# Patient Record
Sex: Female | Born: 1975
Health system: Southern US, Community
[De-identification: ages and names within clinical notes are randomized; demographics above are authoritative.]

## PROBLEM LIST (undated history)

## (undated) ENCOUNTER — Emergency Department: Disposition: A | Discharge status: Home / Self Care | Payer: BC Managed Care – PPO

## (undated) DIAGNOSIS — R232 Flushing: Secondary | ICD-10-CM

## (undated) DIAGNOSIS — C4491 Basal cell carcinoma of skin, unspecified: Secondary | ICD-10-CM

## (undated) DIAGNOSIS — C569 Malignant neoplasm of unspecified ovary: Secondary | ICD-10-CM

## (undated) DIAGNOSIS — R1013 Epigastric pain: Secondary | ICD-10-CM

## (undated) DIAGNOSIS — R053 Chronic cough: Secondary | ICD-10-CM

## (undated) DIAGNOSIS — K2970 Gastritis, unspecified, without bleeding: Secondary | ICD-10-CM

## (undated) DIAGNOSIS — K635 Polyp of colon: Secondary | ICD-10-CM

## (undated) DIAGNOSIS — E8940 Asymptomatic postprocedural ovarian failure: Secondary | ICD-10-CM

## (undated) DIAGNOSIS — R748 Abnormal levels of other serum enzymes: Secondary | ICD-10-CM

## (undated) DIAGNOSIS — U071 COVID-19: Secondary | ICD-10-CM

## (undated) DIAGNOSIS — F419 Anxiety disorder, unspecified: Secondary | ICD-10-CM

## (undated) DIAGNOSIS — Z8669 Personal history of other diseases of the nervous system and sense organs: Secondary | ICD-10-CM

## (undated) DIAGNOSIS — I10 Essential (primary) hypertension: Secondary | ICD-10-CM

## (undated) DIAGNOSIS — R51 Headache: Secondary | ICD-10-CM

## (undated) DIAGNOSIS — M169 Osteoarthritis of hip, unspecified: Secondary | ICD-10-CM

## (undated) DIAGNOSIS — Q659 Congenital deformity of hip, unspecified: Secondary | ICD-10-CM

## (undated) DIAGNOSIS — S82209A Unspecified fracture of shaft of unspecified tibia, initial encounter for closed fracture: Secondary | ICD-10-CM

## (undated) DIAGNOSIS — K222 Esophageal obstruction: Secondary | ICD-10-CM

## (undated) DIAGNOSIS — R05 Cough: Secondary | ICD-10-CM

## (undated) DIAGNOSIS — M247 Protrusio acetabuli: Secondary | ICD-10-CM

## (undated) DIAGNOSIS — M21611 Bunion of right foot: Secondary | ICD-10-CM

## (undated) DIAGNOSIS — K648 Other hemorrhoids: Secondary | ICD-10-CM

## (undated) DIAGNOSIS — K760 Fatty (change of) liver, not elsewhere classified: Secondary | ICD-10-CM

## (undated) DIAGNOSIS — L732 Hidradenitis suppurativa: Secondary | ICD-10-CM

## (undated) DIAGNOSIS — S82409A Unspecified fracture of shaft of unspecified fibula, initial encounter for closed fracture: Secondary | ICD-10-CM

## (undated) HISTORY — DX: Unspecified fracture of shaft of unspecified fibula, initial encounter for closed fracture: S82.409A

## (undated) HISTORY — DX: Unspecified fracture of shaft of unspecified tibia, initial encounter for closed fracture: S82.209A

## (undated) HISTORY — DX: Gastritis, unspecified, without bleeding: K29.70

## (undated) HISTORY — DX: Congenital deformity of hip, unspecified: Q65.9

## (undated) HISTORY — DX: Osteoarthritis of hip, unspecified: M16.9

## (undated) HISTORY — DX: Bunion of right foot: M21.611

## (undated) HISTORY — DX: COVID-19: U07.1

## (undated) HISTORY — DX: Esophageal obstruction: K22.2

## (undated) HISTORY — DX: Protrusio acetabuli: M24.7

## (undated) HISTORY — DX: Personal history of other diseases of the nervous system and sense organs: Z86.69

## (undated) HISTORY — DX: Cough: R05

## (undated) HISTORY — DX: Chronic cough: R05.3

## (undated) HISTORY — DX: Basal cell carcinoma of skin, unspecified: C44.91

## (undated) HISTORY — DX: Other hemorrhoids: K64.8

## (undated) HISTORY — DX: Epigastric pain: R10.13

## (undated) HISTORY — DX: Essential (primary) hypertension: I10

## (undated) HISTORY — DX: Flushing: R23.2

## (undated) HISTORY — PX: JOINT REPLACEMENT: SHX530

## (undated) HISTORY — DX: Polyp of colon: K63.5

## (undated) HISTORY — PX: FRACTURE SURGERY: SHX138

## (undated) HISTORY — DX: Fatty (change of) liver, not elsewhere classified: K76.0

## (undated) HISTORY — DX: Anxiety disorder, unspecified: F41.9

## (undated) HISTORY — DX: Abnormal levels of other serum enzymes: R74.8

## (undated) HISTORY — DX: Asymptomatic postprocedural ovarian failure: E89.40

## (undated) HISTORY — DX: Malignant neoplasm of unspecified ovary: C56.9

## (undated) HISTORY — DX: Hidradenitis suppurativa: L73.2

## (undated) HISTORY — PX: ABDOMINAL HYSTERECTOMY: SHX81

## (undated) HISTORY — PX: OTHER SURGICAL HISTORY: SHX169

---

## 1993-08-11 DIAGNOSIS — C569 Malignant neoplasm of unspecified ovary: Secondary | ICD-10-CM

## 1993-08-11 HISTORY — DX: Malignant neoplasm of unspecified ovary: C56.9

## 2000-07-22 ENCOUNTER — Encounter: Payer: Self-pay | Admitting: Emergency Medicine

## 2000-07-22 ENCOUNTER — Emergency Department (HOSPITAL_COMMUNITY): Admission: EM | Admit: 2000-07-22 | Discharge: 2000-07-22 | Payer: Self-pay | Admitting: Emergency Medicine

## 2000-10-09 HISTORY — PX: LAPAROSCOPY: SHX197

## 2000-10-15 ENCOUNTER — Other Ambulatory Visit: Admission: RE | Admit: 2000-10-15 | Discharge: 2000-10-15 | Payer: Self-pay | Admitting: Obstetrics & Gynecology

## 2000-11-03 ENCOUNTER — Ambulatory Visit (HOSPITAL_COMMUNITY): Admission: RE | Admit: 2000-11-03 | Discharge: 2000-11-03 | Payer: Self-pay | Admitting: Obstetrics & Gynecology

## 2001-10-10 ENCOUNTER — Encounter: Payer: Self-pay | Admitting: *Deleted

## 2001-10-10 ENCOUNTER — Inpatient Hospital Stay (HOSPITAL_COMMUNITY): Admission: AD | Admit: 2001-10-10 | Discharge: 2001-10-10 | Payer: Self-pay | Admitting: Obstetrics & Gynecology

## 2002-01-05 ENCOUNTER — Inpatient Hospital Stay (HOSPITAL_COMMUNITY): Admission: AD | Admit: 2002-01-05 | Discharge: 2002-01-07 | Payer: Self-pay | Admitting: Obstetrics and Gynecology

## 2002-01-06 ENCOUNTER — Encounter: Payer: Self-pay | Admitting: *Deleted

## 2002-02-09 ENCOUNTER — Other Ambulatory Visit: Admission: RE | Admit: 2002-02-09 | Discharge: 2002-02-09 | Payer: Self-pay | Admitting: Obstetrics & Gynecology

## 2004-11-07 ENCOUNTER — Ambulatory Visit: Payer: Self-pay | Admitting: Family Medicine

## 2005-04-23 ENCOUNTER — Ambulatory Visit (HOSPITAL_COMMUNITY): Admission: RE | Admit: 2005-04-23 | Discharge: 2005-04-23 | Payer: Self-pay | Admitting: Obstetrics and Gynecology

## 2005-08-11 HISTORY — PX: TUBAL LIGATION: SHX77

## 2005-08-29 ENCOUNTER — Inpatient Hospital Stay (HOSPITAL_COMMUNITY): Admission: RE | Admit: 2005-08-29 | Discharge: 2005-09-02 | Payer: Self-pay | Admitting: Obstetrics and Gynecology

## 2005-08-30 ENCOUNTER — Encounter (INDEPENDENT_AMBULATORY_CARE_PROVIDER_SITE_OTHER): Payer: Self-pay | Admitting: Specialist

## 2005-10-14 ENCOUNTER — Other Ambulatory Visit: Admission: RE | Admit: 2005-10-14 | Discharge: 2005-10-14 | Payer: Self-pay | Admitting: Gynecology

## 2005-11-19 ENCOUNTER — Ambulatory Visit: Payer: Self-pay | Admitting: Internal Medicine

## 2006-03-03 ENCOUNTER — Ambulatory Visit: Payer: Self-pay | Admitting: Hospitalist

## 2006-03-05 ENCOUNTER — Ambulatory Visit: Payer: Self-pay | Admitting: Hospitalist

## 2006-03-11 ENCOUNTER — Ambulatory Visit: Payer: Self-pay | Admitting: Internal Medicine

## 2006-03-12 ENCOUNTER — Encounter (INDEPENDENT_AMBULATORY_CARE_PROVIDER_SITE_OTHER): Payer: Self-pay | Admitting: Internal Medicine

## 2006-03-16 ENCOUNTER — Encounter: Admission: RE | Admit: 2006-03-16 | Discharge: 2006-03-16 | Payer: Self-pay | Admitting: Gastroenterology

## 2006-03-30 ENCOUNTER — Ambulatory Visit: Payer: Self-pay | Admitting: Hospitalist

## 2006-05-11 DIAGNOSIS — L732 Hidradenitis suppurativa: Secondary | ICD-10-CM

## 2006-05-11 HISTORY — DX: Hidradenitis suppurativa: L73.2

## 2006-05-12 ENCOUNTER — Ambulatory Visit: Payer: Self-pay | Admitting: Internal Medicine

## 2006-05-13 DIAGNOSIS — M169 Osteoarthritis of hip, unspecified: Secondary | ICD-10-CM | POA: Insufficient documentation

## 2006-05-13 DIAGNOSIS — G43909 Migraine, unspecified, not intractable, without status migrainosus: Secondary | ICD-10-CM | POA: Insufficient documentation

## 2006-05-21 ENCOUNTER — Encounter (INDEPENDENT_AMBULATORY_CARE_PROVIDER_SITE_OTHER): Payer: Self-pay | Admitting: Pulmonary Disease

## 2006-05-21 ENCOUNTER — Ambulatory Visit: Payer: Self-pay | Admitting: Internal Medicine

## 2006-05-21 LAB — CONVERTED CEMR LAB
Ferritin: 24 ng/mL (ref 10–291)
Iron: 71 ug/dL (ref 42–145)
MCHC: 32 g/dL (ref 30.0–36.0)
MCV: 81.8 fL (ref 78.0–100.0)
Platelets: 336 10*3/uL (ref 150–400)
RDW: 14.8 % — ABNORMAL HIGH (ref 11.5–14.0)
TIBC: 379 ug/dL (ref 250–470)
UIBC: 308 ug/dL

## 2006-07-09 ENCOUNTER — Encounter (INDEPENDENT_AMBULATORY_CARE_PROVIDER_SITE_OTHER): Payer: Self-pay | Admitting: Internal Medicine

## 2006-07-09 ENCOUNTER — Ambulatory Visit: Payer: Self-pay | Admitting: Internal Medicine

## 2006-07-09 ENCOUNTER — Encounter (INDEPENDENT_AMBULATORY_CARE_PROVIDER_SITE_OTHER): Payer: Self-pay | Admitting: *Deleted

## 2006-07-09 LAB — CONVERTED CEMR LAB
Platelets: 390 10*3/uL — ABNORMAL HIGH (ref 152–374)
RDW: 15.1 % (ref 11.5–15.3)
WBC: 12.4 10*3/uL — ABNORMAL HIGH (ref 3.7–10.0)

## 2007-01-21 ENCOUNTER — Ambulatory Visit: Payer: Self-pay | Admitting: Internal Medicine

## 2007-01-21 ENCOUNTER — Encounter (INDEPENDENT_AMBULATORY_CARE_PROVIDER_SITE_OTHER): Payer: Self-pay | Admitting: Internal Medicine

## 2007-01-21 LAB — CONVERTED CEMR LAB
HCT: 36.2 % (ref 36.0–46.0)
Hemoglobin: 11.6 g/dL — ABNORMAL LOW (ref 12.0–15.0)
RBC: 4.41 M/uL (ref 3.87–5.11)
RDW: 15.8 % — ABNORMAL HIGH (ref 11.5–14.0)
WBC: 10.8 10*3/uL — ABNORMAL HIGH (ref 4.0–10.5)

## 2007-01-26 ENCOUNTER — Encounter (INDEPENDENT_AMBULATORY_CARE_PROVIDER_SITE_OTHER): Payer: Self-pay | Admitting: Internal Medicine

## 2007-08-12 DIAGNOSIS — E8940 Asymptomatic postprocedural ovarian failure: Secondary | ICD-10-CM

## 2007-08-12 HISTORY — PX: TOTAL ABDOMINAL HYSTERECTOMY W/ BILATERAL SALPINGOOPHORECTOMY: SHX83

## 2007-08-12 HISTORY — DX: Asymptomatic postprocedural ovarian failure: E89.40

## 2007-09-08 ENCOUNTER — Encounter (INDEPENDENT_AMBULATORY_CARE_PROVIDER_SITE_OTHER): Payer: Self-pay | Admitting: Obstetrics and Gynecology

## 2007-09-08 ENCOUNTER — Inpatient Hospital Stay (HOSPITAL_COMMUNITY): Admission: RE | Admit: 2007-09-08 | Discharge: 2007-09-11 | Payer: Self-pay | Admitting: Obstetrics and Gynecology

## 2008-04-07 ENCOUNTER — Emergency Department (HOSPITAL_COMMUNITY): Admission: EM | Admit: 2008-04-07 | Discharge: 2008-04-08 | Payer: Self-pay | Admitting: Emergency Medicine

## 2008-08-08 ENCOUNTER — Telehealth (INDEPENDENT_AMBULATORY_CARE_PROVIDER_SITE_OTHER): Payer: Self-pay | Admitting: *Deleted

## 2008-08-08 ENCOUNTER — Ambulatory Visit: Payer: Self-pay | Admitting: Infectious Diseases

## 2008-08-08 LAB — CONVERTED CEMR LAB: Streptococcus, Group A Screen (Direct): NEGATIVE

## 2008-08-16 ENCOUNTER — Encounter (INDEPENDENT_AMBULATORY_CARE_PROVIDER_SITE_OTHER): Payer: Self-pay | Admitting: Internal Medicine

## 2008-08-17 ENCOUNTER — Ambulatory Visit: Payer: Self-pay | Admitting: Internal Medicine

## 2008-10-09 DIAGNOSIS — S82209A Unspecified fracture of shaft of unspecified tibia, initial encounter for closed fracture: Secondary | ICD-10-CM

## 2008-10-09 DIAGNOSIS — S82409A Unspecified fracture of shaft of unspecified fibula, initial encounter for closed fracture: Secondary | ICD-10-CM

## 2008-10-09 HISTORY — DX: Unspecified fracture of shaft of unspecified fibula, initial encounter for closed fracture: S82.409A

## 2008-10-09 HISTORY — DX: Unspecified fracture of shaft of unspecified tibia, initial encounter for closed fracture: S82.209A

## 2008-11-13 ENCOUNTER — Ambulatory Visit: Payer: Self-pay | Admitting: Internal Medicine

## 2008-11-14 ENCOUNTER — Encounter: Payer: Self-pay | Admitting: Internal Medicine

## 2008-11-14 LAB — CONVERTED CEMR LAB: Streptococcus, Group A Screen (Direct): POSITIVE — AB

## 2008-11-15 ENCOUNTER — Telehealth: Payer: Self-pay | Admitting: Internal Medicine

## 2008-11-16 ENCOUNTER — Telehealth (INDEPENDENT_AMBULATORY_CARE_PROVIDER_SITE_OTHER): Payer: Self-pay | Admitting: Internal Medicine

## 2008-11-30 ENCOUNTER — Ambulatory Visit: Payer: Self-pay | Admitting: Internal Medicine

## 2009-02-08 HISTORY — PX: OTHER SURGICAL HISTORY: SHX169

## 2009-02-19 ENCOUNTER — Inpatient Hospital Stay (HOSPITAL_COMMUNITY): Admission: EM | Admit: 2009-02-19 | Discharge: 2009-02-24 | Payer: Self-pay | Admitting: Emergency Medicine

## 2009-02-19 ENCOUNTER — Ambulatory Visit: Payer: Self-pay | Admitting: Cardiology

## 2009-02-19 ENCOUNTER — Ambulatory Visit: Payer: Self-pay | Admitting: Orthopedic Surgery

## 2009-02-21 ENCOUNTER — Encounter: Payer: Self-pay | Admitting: Cardiology

## 2009-02-22 ENCOUNTER — Encounter: Payer: Self-pay | Admitting: Orthopedic Surgery

## 2009-02-22 ENCOUNTER — Encounter: Payer: Self-pay | Admitting: Cardiology

## 2009-02-26 ENCOUNTER — Encounter: Payer: Self-pay | Admitting: Orthopedic Surgery

## 2009-02-27 ENCOUNTER — Telehealth: Payer: Self-pay | Admitting: Orthopedic Surgery

## 2009-03-05 ENCOUNTER — Ambulatory Visit: Payer: Self-pay | Admitting: Orthopedic Surgery

## 2009-03-05 DIAGNOSIS — S82209A Unspecified fracture of shaft of unspecified tibia, initial encounter for closed fracture: Secondary | ICD-10-CM | POA: Insufficient documentation

## 2009-03-05 DIAGNOSIS — S82839B Other fracture of upper and lower end of unspecified fibula, initial encounter for open fracture type I or II: Secondary | ICD-10-CM

## 2009-03-05 DIAGNOSIS — S82109B Unspecified fracture of upper end of unspecified tibia, initial encounter for open fracture type I or II: Secondary | ICD-10-CM | POA: Insufficient documentation

## 2009-03-08 ENCOUNTER — Encounter: Payer: Self-pay | Admitting: Orthopedic Surgery

## 2009-03-09 ENCOUNTER — Telehealth: Payer: Self-pay | Admitting: Orthopedic Surgery

## 2009-03-09 ENCOUNTER — Emergency Department (HOSPITAL_COMMUNITY): Admission: EM | Admit: 2009-03-09 | Discharge: 2009-03-09 | Payer: Self-pay | Admitting: Emergency Medicine

## 2009-03-10 ENCOUNTER — Encounter (HOSPITAL_COMMUNITY): Admission: RE | Admit: 2009-03-10 | Discharge: 2009-04-09 | Payer: Self-pay | Admitting: Orthopaedic Surgery

## 2009-03-12 ENCOUNTER — Ambulatory Visit (HOSPITAL_COMMUNITY): Payer: Self-pay | Admitting: Orthopaedic Surgery

## 2009-03-13 ENCOUNTER — Telehealth: Payer: Self-pay | Admitting: Orthopedic Surgery

## 2009-03-14 ENCOUNTER — Telehealth: Payer: Self-pay | Admitting: Orthopedic Surgery

## 2009-03-15 ENCOUNTER — Encounter: Payer: Self-pay | Admitting: Orthopedic Surgery

## 2009-03-19 ENCOUNTER — Ambulatory Visit: Payer: Self-pay | Admitting: Orthopedic Surgery

## 2009-03-19 ENCOUNTER — Telehealth: Payer: Self-pay | Admitting: Orthopedic Surgery

## 2009-03-27 ENCOUNTER — Telehealth: Payer: Self-pay | Admitting: Orthopedic Surgery

## 2009-04-02 ENCOUNTER — Ambulatory Visit: Payer: Self-pay | Admitting: Orthopedic Surgery

## 2009-04-09 ENCOUNTER — Telehealth (INDEPENDENT_AMBULATORY_CARE_PROVIDER_SITE_OTHER): Payer: Self-pay | Admitting: *Deleted

## 2009-04-09 DIAGNOSIS — M25673 Stiffness of unspecified ankle, not elsewhere classified: Secondary | ICD-10-CM | POA: Insufficient documentation

## 2009-04-09 DIAGNOSIS — M25676 Stiffness of unspecified foot, not elsewhere classified: Secondary | ICD-10-CM

## 2009-04-11 ENCOUNTER — Encounter: Payer: Self-pay | Admitting: Orthopedic Surgery

## 2009-04-11 ENCOUNTER — Encounter (HOSPITAL_COMMUNITY): Admission: RE | Admit: 2009-04-11 | Discharge: 2009-05-09 | Payer: Self-pay | Admitting: Orthopedic Surgery

## 2009-05-11 ENCOUNTER — Encounter (HOSPITAL_COMMUNITY): Admission: RE | Admit: 2009-05-11 | Discharge: 2009-06-10 | Payer: Self-pay | Admitting: Orthopedic Surgery

## 2009-05-14 ENCOUNTER — Ambulatory Visit: Payer: Self-pay | Admitting: Orthopedic Surgery

## 2009-05-18 ENCOUNTER — Ambulatory Visit: Payer: Self-pay | Admitting: Internal Medicine

## 2009-05-18 DIAGNOSIS — F411 Generalized anxiety disorder: Secondary | ICD-10-CM

## 2009-05-18 DIAGNOSIS — F419 Anxiety disorder, unspecified: Secondary | ICD-10-CM | POA: Insufficient documentation

## 2009-05-21 ENCOUNTER — Encounter: Payer: Self-pay | Admitting: Orthopedic Surgery

## 2009-05-25 ENCOUNTER — Telehealth (INDEPENDENT_AMBULATORY_CARE_PROVIDER_SITE_OTHER): Payer: Self-pay | Admitting: Internal Medicine

## 2009-06-04 ENCOUNTER — Ambulatory Visit: Payer: Self-pay | Admitting: Internal Medicine

## 2009-06-05 ENCOUNTER — Encounter (INDEPENDENT_AMBULATORY_CARE_PROVIDER_SITE_OTHER): Payer: Self-pay | Admitting: Internal Medicine

## 2009-06-05 LAB — CONVERTED CEMR LAB
Cholesterol: 146 mg/dL (ref 0–200)
HDL: 58 mg/dL (ref >39)
Triglycerides: 92 mg/dL (ref <150)

## 2009-06-06 ENCOUNTER — Encounter: Payer: Self-pay | Admitting: Orthopedic Surgery

## 2009-06-12 ENCOUNTER — Telehealth: Payer: Self-pay | Admitting: *Deleted

## 2009-06-19 LAB — CONVERTED CEMR LAB
AST: 14 units/L (ref 0–37)
BUN: 9 mg/dL (ref 6–23)
CO2: 24 meq/L (ref 19–32)
Calcium: 9.5 mg/dL (ref 8.4–10.5)
Chloride: 104 meq/L (ref 96–112)
Creatinine, Ser: 0.79 mg/dL (ref 0.40–1.20)
HCT: 37.5 % (ref 36.0–46.0)
Hemoglobin: 11.7 g/dL — ABNORMAL LOW (ref 12.0–15.0)
MCV: 83.9 fL (ref >=78.0)
RDW: 14.3 % (ref 11.5–15.5)
TSH: 0.63 microintl units/mL (ref 0.350–4.5)
Total Bilirubin: 0.8 mg/dL (ref 0.3–1.2)

## 2009-08-15 ENCOUNTER — Ambulatory Visit: Payer: Self-pay | Admitting: Orthopedic Surgery

## 2009-08-20 ENCOUNTER — Ambulatory Visit: Payer: Self-pay | Admitting: Internal Medicine

## 2009-08-20 ENCOUNTER — Telehealth (INDEPENDENT_AMBULATORY_CARE_PROVIDER_SITE_OTHER): Payer: Self-pay | Admitting: Internal Medicine

## 2009-08-20 DIAGNOSIS — E8941 Symptomatic postprocedural ovarian failure: Secondary | ICD-10-CM | POA: Insufficient documentation

## 2009-08-20 DIAGNOSIS — E559 Vitamin D deficiency, unspecified: Secondary | ICD-10-CM | POA: Insufficient documentation

## 2009-09-14 ENCOUNTER — Ambulatory Visit: Payer: Self-pay | Admitting: Internal Medicine

## 2009-09-14 LAB — CONVERTED CEMR LAB
AST: 17 units/L (ref 0–37)
Albumin: 4.2 g/dL (ref 3.5–5.2)
Alkaline Phosphatase: 94 units/L (ref 39–117)
Potassium: 3.6 meq/L (ref 3.5–5.3)
Sodium: 141 meq/L (ref 135–145)
Total Protein: 7.6 g/dL (ref 6.0–8.3)

## 2009-09-21 ENCOUNTER — Telehealth (INDEPENDENT_AMBULATORY_CARE_PROVIDER_SITE_OTHER): Payer: Self-pay | Admitting: *Deleted

## 2009-09-24 ENCOUNTER — Ambulatory Visit: Payer: Self-pay | Admitting: Internal Medicine

## 2009-11-13 ENCOUNTER — Ambulatory Visit: Payer: Self-pay | Admitting: Internal Medicine

## 2009-11-20 ENCOUNTER — Telehealth: Payer: Self-pay | Admitting: *Deleted

## 2009-12-27 ENCOUNTER — Telehealth: Payer: Self-pay | Admitting: *Deleted

## 2009-12-27 ENCOUNTER — Emergency Department (HOSPITAL_COMMUNITY): Admission: EM | Admit: 2009-12-27 | Discharge: 2009-12-27 | Payer: Self-pay | Admitting: Family Medicine

## 2009-12-31 ENCOUNTER — Ambulatory Visit: Payer: Self-pay | Admitting: Internal Medicine

## 2010-01-22 ENCOUNTER — Ambulatory Visit: Payer: Self-pay | Admitting: Internal Medicine

## 2010-01-26 ENCOUNTER — Emergency Department (HOSPITAL_COMMUNITY): Admission: EM | Admit: 2010-01-26 | Discharge: 2010-01-26 | Payer: Self-pay | Admitting: Family Medicine

## 2010-02-06 ENCOUNTER — Telehealth (INDEPENDENT_AMBULATORY_CARE_PROVIDER_SITE_OTHER): Payer: Self-pay | Admitting: *Deleted

## 2010-03-20 ENCOUNTER — Encounter: Payer: Self-pay | Admitting: *Deleted

## 2010-03-20 ENCOUNTER — Ambulatory Visit: Payer: Self-pay | Admitting: Internal Medicine

## 2010-03-20 DIAGNOSIS — I10 Essential (primary) hypertension: Secondary | ICD-10-CM | POA: Insufficient documentation

## 2010-03-22 ENCOUNTER — Telehealth: Payer: Self-pay | Admitting: Internal Medicine

## 2010-04-08 ENCOUNTER — Encounter: Payer: Self-pay | Admitting: Internal Medicine

## 2010-05-15 ENCOUNTER — Telehealth: Payer: Self-pay | Admitting: *Deleted

## 2010-05-15 ENCOUNTER — Emergency Department (HOSPITAL_COMMUNITY): Admission: EM | Admit: 2010-05-15 | Discharge: 2010-05-15 | Payer: Self-pay | Admitting: Family Medicine

## 2010-08-08 ENCOUNTER — Telehealth: Payer: Self-pay | Admitting: Internal Medicine

## 2010-08-08 ENCOUNTER — Emergency Department (HOSPITAL_COMMUNITY)
Admission: EM | Admit: 2010-08-08 | Discharge: 2010-08-08 | Payer: Self-pay | Source: Home / Self Care | Admitting: Family Medicine

## 2010-08-15 ENCOUNTER — Ambulatory Visit: Admission: RE | Admit: 2010-08-15 | Discharge: 2010-08-15 | Payer: Self-pay | Source: Home / Self Care

## 2010-08-15 DIAGNOSIS — M62830 Muscle spasm of back: Secondary | ICD-10-CM | POA: Insufficient documentation

## 2010-08-15 LAB — CONVERTED CEMR LAB
Bilirubin Urine: NEGATIVE
Ketones, urine, test strip: NEGATIVE
Specific Gravity, Urine: 1.02
pH: 5.5

## 2010-08-31 ENCOUNTER — Encounter: Payer: Self-pay | Admitting: Neurology

## 2010-09-06 ENCOUNTER — Ambulatory Visit: Admission: RE | Admit: 2010-09-06 | Discharge: 2010-09-06 | Payer: Self-pay | Source: Home / Self Care

## 2010-09-06 DIAGNOSIS — R1084 Generalized abdominal pain: Secondary | ICD-10-CM | POA: Insufficient documentation

## 2010-09-09 LAB — CONVERTED CEMR LAB
Albumin: 4.3 g/dL (ref 3.5–5.2)
Alkaline Phosphatase: 68 units/L (ref 39–117)
Basophils Relative: 0 % (ref 0–1)
Benzodiazepines.: NEGATIVE
CO2: 27 meq/L (ref 19–32)
Chloride: 103 meq/L (ref 96–112)
Creatinine,U: 250.3 mg/dL
Glucose, Bld: 84 mg/dL (ref 70–99)
Leukocytes, UA: NEGATIVE
MCHC: 33.8 g/dL (ref 30.0–36.0)
Methadone: NEGATIVE
Monocytes Relative: 10 % (ref 3–12)
Neutro Abs: 4.9 10*3/uL (ref 1.7–7.7)
Neutrophils Relative %: 57 % (ref 43–77)
Nitrite: NEGATIVE
Potassium: 3.5 meq/L (ref 3.5–5.3)
Propoxyphene: NEGATIVE
Protein, ur: NEGATIVE mg/dL
RBC: 4.43 M/uL (ref 3.87–5.11)
Sodium: 141 meq/L (ref 135–145)
Total Protein: 7.5 g/dL (ref 6.0–8.3)
Urine Glucose: NEGATIVE mg/dL
Urobilinogen, UA: 0.2 (ref 0.0–1.0)
WBC: 8.6 10*3/uL (ref 4.0–10.5)

## 2010-09-10 ENCOUNTER — Other Ambulatory Visit: Payer: Self-pay | Admitting: Internal Medicine

## 2010-09-10 NOTE — Progress Notes (Signed)
Summary: tooth/ hla  Phone Note Call from Patient   Summary of Call: pt calls to request visit to clinic for possible abscessed tooth, states she has h/a and feels very bad, denies fevers, she is referred to urgent care asap. she is agreeable Initial call taken by: Marin Roberts RN,  May 15, 2010 9:50 AM

## 2010-09-10 NOTE — Assessment & Plan Note (Signed)
Summary: f/u, to discuss divigel/ pcp-[facility]/hla   Vital Signs:  Patient profile:   35 year old female Height:      70.5 inches (179.07 cm) Weight:      154.1 pounds (70.05 kg) BMI:     21.88 Temp:     97.8 degrees F (36.56 degrees C) oral Pulse rate:   71 / minute BP sitting:   140 / 91  (right arm)  Vitals Entered By: Stanton Kidney Ditzler RN (January 22, 2010 1:58 PM) Is Patient Diabetic? No Pain Assessment Patient in pain? yes     Location: h/a Intensity: 6 Type: ? Onset of pain  today Nutritional Status BMI of 19 -24 = normal Nutritional Status Detail appetite good  Have you ever been in a relationship where you felt threatened, hurt or afraid?denies   Does patient need assistance? Functional Status Self care Ambulation Normal Comments On med x 2 months - hot flashes are worse.   Primary Care Provider:  Elby Showers MD   History of Present Illness: Ms. [name] comes for the followings:  1. Migraine HA: She has HA now and she uses ibuprofen. She responded well to cafergot.   2. Menopausal symptoms: She was just prescribed estrogen gel and she checked [facility] and it was 72 dollars. She has really not started using it and she came today to discuss how to use the gel.     Preventive Screening-Counseling & Management  Alcohol-Tobacco     Alcohol drinks/day: 0     Smoking Status: never  Caffeine-Diet-Exercise     Caffeine use/day: 1 a day     Does Patient Exercise: no  Current Medications (verified): 1)  Epipen 0.3 Mg/0.29ml Devi (Epinephrine) .... Use If You Experience Throat Tightness or Shortness of Breath After Exposure To Allergen. 2)  Gabapentin 300 Mg Caps (Gabapentin) .... Take One Tablet Three Times A Day For Hot Flashes. 3)  Vitamin D (Ergocalciferol) 50000 Unit Caps (Ergocalciferol) .... Take One Tablet A Week For 8 Weeks. 4)  Calcium 1200-1000 Mg-Unit Chew (Calcium Carbonate-Vit D-Min) .... Take One Suppliment Daily 5)  Divigel 1 Mg/gm Gel (Estradiol)  .... Apply .25mg  To Upper Thigh Daily For Treatment of Hot Flashes 6)  Cafergot 1-100 Mg Tabs (Ergotamine-Caffeine) .... Two Tablets At Onset of Headach, Then 1 Tab Every 30 Mins. As Needed Up To 6x Per Attack and Up To 10 Tabs Per Week  Allergies: 1)  ! * Pineapple  Review of Systems      See HPI  Physical Exam  Mouth:  pharynx pink and moist.   Lungs:  normal breath sounds, no crackles, and no wheezes.   Heart:  normal rate, regular rhythm, no murmur, no gallop, and no rub.   Extremities:  trace left pedal edema and trace right pedal edema.   Neurologic:  alert & oriented X3.     Impression & Recommendations:  Problem # 1:  MENOPAUSE, SURGICAL (ICD-627.4) I gave her the directions of how to use estradiol gel. She has not checked if her insurance covers the medicine. She says if the medicine works and she has to pay 72 box out of pocket, she is willing to. I mentioned her about the risk of chronic estrogen use. She will f/u with Dr. Clent Ridges in a month so that she can explain to the pt why Dr. Clent Ridges has chosen this medicine for her. Her updated medication list for this problem includes:    Divigel 1 Mg/gm Gel (Estradiol) .Marland Kitchen... Apply .25mg  to  upper thigh daily for treatment of hot flashes  Problem # 2:  MIGRAINE HEADACHE (ICD-346.90) She says cafergot works for her, will start today again.  Her updated medication list for this problem includes:    Cafergot 1-100 Mg Tabs (Ergotamine-caffeine) .Marland Kitchen..Marland Kitchen Two tablets at onset of headach, then 1 tab every 30 mins. as needed up to 6x per attack and up to 10 tabs per week  Complete Medication List: 1)  Epipen 0.3 Mg/0.31ml Devi (Epinephrine) .... Use if you experience throat tightness or shortness of breath after exposure to allergen. 2)  Gabapentin 300 Mg Caps (Gabapentin) .... Take one tablet three times a day for hot flashes. 3)  Vitamin D (ergocalciferol) 50000 Unit Caps (Ergocalciferol) .... Take one tablet a week for 8 weeks. 4)  Calcium  1200-1000 Mg-unit Chew (Calcium carbonate-vit d-min) .... Take one suppliment daily 5)  Divigel 1 Mg/gm Gel (Estradiol) .... Apply .25mg  to upper thigh daily for treatment of hot flashes 6)  Cafergot 1-100 Mg Tabs (Ergotamine-caffeine) .... Two tablets at onset of headach, then 1 tab every 30 mins. as needed up to 6x per attack and up to 10 tabs per week  Patient Instructions: 1)  Please schedule a follow-up appointment in 1 month. 2)  Limit your Sodium (Salt) to less than 2 grams a day(slightly less than 1/2 a teaspoon) to prevent fluid retention, swelling, or worsening of symptoms. 3)  It is important that you exercise regularly at least 20 minutes 5 times a week. If you develop chest pain, have severe difficulty breathing, or feel very tired , stop exercising immediately and seek medical attention. 4)  You need to lose weight. Consider a lower calorie diet and regular exercise.  Prescriptions: CAFERGOT 1-100 MG TABS (ERGOTAMINE-CAFFEINE) two tablets at onset of headach, then 1 tab every 30 mins. as needed up to 6x per attack and up to 10 tabs per week  #50 x 0   Entered and Authorized by:   Jason Coop MD   Signed by:   Jason Coop MD on 01/22/2010   Method used:   [facility] to        [facility] [phone]* (retail)       [address]       [place], [place]  [address]       Ph: [phone] or [phone]       Fax: [phone]   RxID:   [id number]

## 2010-09-10 NOTE — Assessment & Plan Note (Signed)
Summary: ACUTE-MIGRANE HA'S X  1 WEEK GETTING WORSE/CFB([facility])   Vital Signs:  Patient profile:   35 year old female Height:      70.5 inches (179.07 cm) Weight:      153.5 pounds (69.77 kg) BMI:     21.79 Temp:     97.0 degrees F (36.11 degrees C) oral Pulse rate:   57 / minute BP sitting:   140 / 90  (right arm)  Vitals Entered By: Stanton Kidney Ditzler RN (September 14, 2009 1:45 PM) Is Patient Diabetic? No Pain Assessment Patient in pain? yes     Location: head Intensity: 10+ Type: sharp Onset of pain  past week Nutritional Status BMI of 19 -24 = normal Nutritional Status Detail appetite ok  Have you ever been in a relationship where you felt threatened, hurt or afraid?denies   Does patient need assistance? Functional Status Self care Ambulation Normal Comments Past week - pain behind left ear - different then other h/a.   Primary Care Provider:  Elby Showers MD   History of Present Illness: This is a  35 year old woman with past medical history as described in th EMR; who comes today with headache, which is worse and not responding to the use of ibuprofen. In fact patient reports ibuprofen is making her gastritis worse. Patient reports HA is worse with bright lights, unilateral (starting behind her left ear), throbbing in quality, 10/10 at its worse, associated with nausea, but not vomiting events. Patient reports pain last about 4-5 hours at its maximun and then would be bbetter but still having sorness. Patient is able to fall sleep and when she woke up in the AM the pain is not as bad as before, but still there.  BP is 140/90.  Depression History:      The patient denies a depressed mood most of the day and a diminished interest in her usual daily activities.        The patient denies that she feels like life is not worth living, denies that she wishes that she were dead, and denies that she has thought about ending her life.         Preventive Screening-Counseling &  Management  Alcohol-Tobacco     Alcohol drinks/day: 0     Smoking Status: never  Caffeine-Diet-Exercise     Caffeine use/day: 1 a day     Does Patient Exercise: no  Problems Prior to Update: 1)  Menopause, Surgical  (ICD-627.4) 2)  Vitamin D Deficiency  (ICD-268.9) 3)  Vaginitis, Atrophic  (ICD-627.3) 4)  Hot Flashes  (ICD-627.2) 5)  Screening For Lipoid Disorders  (ICD-V77.91) 6)  Allergy  (ICD-995.3) 7)  Anxiety  (ICD-300.00) 8)  Ankle Sprain  (ICD-845.00) 9)  Stiffness of Joint Nec Ankle and Foot  (ICD-719.57) 10)  Closed Fracture of Shaft of Tibia  (ICD-823.20) 11)  Open Fracture of Upper End of Fibula With Tibia  (ICD-823.12) 12)  Family History of Arthritis  (ICD-V17.7) 13)  Family History of Diabetes  (ICD-V18.0) 14)  Sore Throat  (ICD-462) 15)  Maxillary Sinusitis  (ICD-473.0) 16)  Health Maintenance Exam  (ICD-V70.0) 17)  Abdominal Pain  (ICD-789.00) 18)  Cough, Chronic  (ICD-786.2) 19)  Gastritis, Chronic  (ICD-535.10) 20)  Osteoarthritis, Hip  (ICD-715.95) 21)  Hidradenitis Suppurativa  (ICD-705.83) 22)  Migraine Headache  (ICD-346.90)  Current Problems (verified): 1)  Menopause, Surgical  (ICD-627.4) 2)  Vitamin D Deficiency  (ICD-268.9) 3)  Vaginitis, Atrophic  (ICD-627.3) 4)  Hot  Flashes  (ICD-627.2) 5)  Screening For Lipoid Disorders  (ICD-V77.91) 6)  Allergy  (ICD-995.3) 7)  Anxiety  (ICD-300.00) 8)  Ankle Sprain  (ICD-845.00) 9)  Stiffness of Joint Nec Ankle and Foot  (ICD-719.57) 10)  Closed Fracture of Shaft of Tibia  (ICD-823.20) 11)  Open Fracture of Upper End of Fibula With Tibia  (ICD-823.12) 12)  Family History of Arthritis  (ICD-V17.7) 13)  Family History of Diabetes  (ICD-V18.0) 14)  Sore Throat  (ICD-462) 15)  Maxillary Sinusitis  (ICD-473.0) 16)  Health Maintenance Exam  (ICD-V70.0) 17)  Abdominal Pain  (ICD-789.00) 18)  Cough, Chronic  (ICD-786.2) 19)  Gastritis, Chronic  (ICD-535.10) 20)  Osteoarthritis, Hip  (ICD-715.95) 21)   Hidradenitis Suppurativa  (ICD-705.83) 22)  Migraine Headache  (ICD-346.90)  Medications Prior to Update: 1)  Ibuprofen 800 Mg Tabs (Ibuprofen) .Marland Kitchen.. 1 By Mouth Three Times A Day 2)  Epipen 0.3 Mg/0.57ml Devi (Epinephrine) .... Use If You Experience Throat Tightness or Shortness of Breath After Exposure To Allergen. 3)  Estrace 1 Mg Tabs (Estradiol) .... Take One Tablet Daily For Hot Flashes. 4)  Calcium 1500 Mg Tabs (Calcium Carbonate) .... Take One Tablet Daily. 5)  Vitamin D (Ergocalciferol) 50000 Unit Caps (Ergocalciferol) .... Take One Tablet Weekly For 8 Weeks.  Current Medications (verified): 1)  Ibuprofen 800 Mg Tabs (Ibuprofen) .Marland Kitchen.. 1 By Mouth Three Times A Day 2)  Epipen 0.3 Mg/0.46ml Devi (Epinephrine) .... Use If You Experience Throat Tightness or Shortness of Breath After Exposure To Allergen. 3)  Estrace 1 Mg Tabs (Estradiol) .... Take One Tablet Daily For Hot Flashes. 4)  Calcium 1500 Mg Tabs (Calcium Carbonate) .... Take One Tablet Daily. 5)  Vitamin D (Ergocalciferol) 50000 Unit Caps (Ergocalciferol) .... Take One Tablet Weekly For 8 Weeks.  Allergies: 1)  ! 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 left tib fib 02/20/09 [place], fracture  Family History: Last updated: 06/04/2009 Family History of Diabetes Family History of Arthritis no breast cancer cousin with ovarian cancer  Social History: Last updated: 06/04/2009 Patient is married.  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  Physical Exam  General:  alert, well-developed, well-nourished, and well-hydrated.   Eyes:  EOMI, PERLLA, no ictericia, no conjuctiva injection. Vision grossly intact. Lungs:  Normal respiratory effort, chest expands symmetrically. Lungs are clear to auscultation, no crackles or wheezes. Heart:  Mild bradycardia, regular rhythm, and no murmur.   Abdomen:  soft, non-tender, normal bowel sounds, and no distention.   Neurologic:  alert & oriented X3, cranial nerves II-XII intact, strength normal in all extremities, and gait normal.    Impression & Recommendations:  Problem # 1:  GASTRITIS, CHRONIC (ICD-535.10) Patient is still having some discomfort related to her reflux and gastritis, especially now after been using NSAID. Will start treatment with prilosec 20mg  daily and will provide information regarding lifestyle modification changes for patients with gastritis.   Her updated medication list for this problem includes:    Prilosec 20 Mg  Cpdr (Omeprazole) .Marland Kitchen... Take 1 tablet by mouth once a day  Problem # 2:  MIGRAINE HEADACHE (ICD-346.90) CMET and TSH normal. Will recommend acute treatment with treximet and flexeril (which would help with her neck pain) and will start prophylactic tx with topamax.  Her updated medication list for this problem includes:    Treximet 85-500 Mg Tabs (Sumatriptan-naproxen sodium) .Marland Kitchen... Take 1 tablet by mouth every 8 hours for the first 3 days, then use medication as needed for pain.  Orders: T-Comprehensive Metabolic Panel ([id number])  Problem # 3:  VITAMIN D DEFICIENCY (ICD-268.9) Patient will continue taking vit D as instructed and in 3 months will recheck her vitamin levels again. Also she has been advised to increased dairy products consumption.  Complete Medication List: 1)  Treximet 85-500 Mg Tabs (Sumatriptan-naproxen sodium) .... Take 1 tablet by mouth every 8 hours for the  first 3 days, then use medication as needed for pain. 2)  Epipen 0.3 Mg/0.71ml Devi (Epinephrine) .... Use if you experience throat tightness or shortness of breath after exposure to allergen. 3)  Estrace 1 Mg Tabs (Estradiol) .... Take one tablet daily for hot flashes. 4)  Calcium 1500 Mg Tabs (Calcium carbonate) .... Take one tablet daily. 5)  Vitamin D (ergocalciferol) 50000 Unit Caps (Ergocalciferol) .... Take one tablet weekly for 8 weeks. 6)  Flexeril 5 Mg Tabs (Cyclobenzaprine hcl) .... Take 1 tab by mouth at bedtime 7)  Topamax 50 Mg Tabs (Topiramate) .... Take 1/2 tablet by mouth at bedtime for 1 week; then start taking 1 tablet by mouth at bedtime. 8)  Prilosec 20 Mg Cpdr (Omeprazole) .... Take 1 tablet by mouth once a day  Other Orders: T-TSH ([id number])  Patient Instructions: 1)  Please schedule a follow-up appointment in 3 months. 2)  Take your medications as prescribed. 3)  Avoid foods high in acid (tomatoes, citrus juices, spicy foods). Avoid eating within two hours of lying down or before exercising. Do not over eat; try smaller more frequent meals. Elevate head of bed twelve inches when sleeping. 4)  Call the clinic with question or concerns. 5)  Start taking a multivitamin daily. Prescriptions: PRILOSEC 20 MG CPDR (OMEPRAZOLE) Take 1 tablet by mouth once a day  #31 x 3   Entered and Authorized by:   Vassie Loll MD   Signed by:   Vassie Loll MD on 09/14/2009   Method used:   Print then Give to Patient   RxID:   [id number] TOPAMAX 50 MG TABS (TOPIRAMATE) Take 1/2 tablet by mouth at bedtime for 1 week; then start taking 1 tablet by mouth at bedtime.  #31 x 2   Entered and Authorized by:   Vassie Loll MD   Signed by:   Vassie Loll MD on 09/14/2009   Method used:   Print then Give to Patient   RxID:   [id number] FLEXERIL 5 MG TABS (CYCLOBENZAPRINE HCL) Take 1 tab by mouth at bedtime  #10 x 0   Entered and Authorized by:   Vassie Loll MD   Signed by:    Vassie Loll MD on 09/14/2009   Method used:   Print then Give to Patient   RxID:   [id number] TREXIMET 85-500 MG TABS (SUMATRIPTAN-NAPROXEN SODIUM) Take 1 tablet by mouth every 8 hours for the first 3 days, then use medication as needed for pain.  #45 x 3   Entered and Authorized by:   Vassie Loll MD   Signed by:   Vassie Loll MD on  09/14/2009   Method used:   Print then Give to Patient   RxID:   [id number]  Process Orders Check Orders Results:     [facility]: Check successful Tests Sent for requisitioning (September 24, 2009 12:17 AM):     09/14/2009: [facility] -- T-Comprehensive Metabolic Panel [[id number]] (signed)     09/14/2009: [facility] -- T-TSH [phone] (signed)    Prevention & Chronic Care Immunizations   Influenza vaccine: Not documented   Influenza vaccine deferral: Deferred  (05/18/2009)    Tetanus booster: Not documented   Td booster deferral: Deferred  (09/14/2009)    Pneumococcal vaccine: Not documented   Pneumococcal vaccine deferral: Deferred  (09/14/2009)  Other Screening   Pap smear: Not documented   Pap smear action/deferral: Not indicated S/P hysterectomy  (05/18/2009)   Smoking status: never  (09/14/2009)  Lipids   Total Cholesterol: 146  (06/05/2009)   LDL: 70  (06/05/2009)   LDL Direct: Not documented   HDL: 58  (06/05/2009)   Triglycerides: 92  (06/05/2009)

## 2010-09-10 NOTE — Progress Notes (Signed)
Summary: dental pain/ hla  Phone Note Call from Patient   Summary of Call: pt calls c/o upper R dental pain, cannot see dentist for appr 1 wk, states, feels very tired, "heart fluttering", mouth swollen. she is directed to urgent care asap. states she will go now Initial call taken by: Marin Roberts RN,  Dec 27, 2009 2:40 PM

## 2010-09-10 NOTE — Assessment & Plan Note (Signed)
Summary: h/a cont, sclera has become yellow/pcp-walsh/hla   Vital Signs:  Patient profile:   35 year old female Height:      70.5 inches Weight:      153.0 pounds BMI:     21.72 Temp:     97..6 degrees F oral Pulse rate:   53 / minute BP sitting:   133 / 86  (right arm)  Vitals Entered By: Filomena Jungling NT II (September 24, 2009 8:59 AM) CC: headaches/ ? about eyes and neck stiffines Is Patient Diabetic? No Pain Assessment Patient in pain? yes     Location: headachhes Intensity: 6 Type: dull Onset of pain  2 weeks Nutritional Status BMI of 19 -24 = normal  Have you ever been in a relationship where you felt threatened, hurt or afraid?No   Does patient need assistance? Functional Status Self care Ambulation Normal   Primary Care Provider:  Elby Showers MD  CC:  headaches/ ? about eyes and neck stiffines.  History of Present Illness: Patient comes in today and with no relief in headache. She did not take medicines prescribed by Dr Gwenlyn Perking during last visit as they were really expensive. Thye pain is described as persistent aching type pain which is 10/10 at its worse and currently 6/10. Onset is gradual, duration is persistent, its persistent in pregresiion, dull aching in nature and is aggravated by stress and lack of sleep and relieved by goody powders. She also complains that she has not been sleeping well at all for last few months mostly because of stress and drinks a lot of coffee and eats goody powders to keep up. Cannot afford the previously prescribed meds. No other complaints.      Preventive Screening-Counseling & Management  Alcohol-Tobacco     Alcohol drinks/day: 0     Smoking Status: never  Caffeine-Diet-Exercise     Caffeine use/day: 1 a day     Does Patient Exercise: no  Problems Prior to Update: 1)  Menopause, Surgical  (ICD-627.4) 2)  Vitamin D Deficiency  (ICD-268.9) 3)  Vaginitis, Atrophic  (ICD-627.3) 4)  Hot Flashes  (ICD-627.2) 5)   Screening For Lipoid Disorders  (ICD-V77.91) 6)  Allergy  (ICD-995.3) 7)  Anxiety  (ICD-300.00) 8)  Ankle Sprain  (ICD-845.00) 9)  Stiffness of Joint Nec Ankle and Foot  (ICD-719.57) 10)  Closed Fracture of Shaft of Tibia  (ICD-823.20) 11)  Open Fracture of Upper End of Fibula With Tibia  (ICD-823.12) 12)  Family History of Arthritis  (ICD-V17.7) 13)  Family History of Diabetes  (ICD-V18.0) 14)  Sore Throat  (ICD-462) 15)  Maxillary Sinusitis  (ICD-473.0) 16)  Health Maintenance Exam  (ICD-V70.0) 17)  Abdominal Pain  (ICD-789.00) 18)  Cough, Chronic  (ICD-786.2) 19)  Gastritis, Chronic  (ICD-535.10) 20)  Osteoarthritis, Hip  (ICD-715.95) 21)  Hidradenitis Suppurativa  (ICD-705.83) 22)  Migraine Headache  (ICD-346.90)  Medications Prior to Update: 1)  Treximet 85-500 Mg Tabs (Sumatriptan-Naproxen Sodium) .... Take 1 Tablet By Mouth Every 8 Hours For The First 3 Days, Then Use Medication As Needed For Pain. 2)  Epipen 0.3 Mg/0.109ml Devi (Epinephrine) .... Use If You Experience Throat Tightness or Shortness of Breath After Exposure To Allergen. 3)  Estrace 1 Mg Tabs (Estradiol) .... Take One Tablet Daily For Hot Flashes. 4)  Calcium 1500 Mg Tabs (Calcium Carbonate) .... Take One Tablet Daily. 5)  Vitamin D (Ergocalciferol) 50000 Unit Caps (Ergocalciferol) .... Take One Tablet Weekly For 8 Weeks. 6)  Flexeril 5 Mg Tabs (  Cyclobenzaprine Hcl) .... Take 1 Tab By Mouth At Bedtime 7)  Topamax 50 Mg Tabs (Topiramate) .... Take 1/2 Tablet By Mouth At Bedtime For 1 Week; Then Start Taking 1 Tablet By Mouth At Bedtime. 8)  Prilosec 20 Mg Cpdr (Omeprazole) .... Take 1 Tablet By Mouth Once A Day  Current Medications (verified): 1)  Epipen 0.3 Mg/0.86ml Devi (Epinephrine) .... Use If You Experience Throat Tightness or Shortness of Breath After Exposure To Allergen. 2)  Estrace 1 Mg Tabs (Estradiol) .... Take One Tablet Daily For Hot Flashes. 3)  Calcium 1500 Mg Tabs (Calcium Carbonate) .... Take One  Tablet Daily. 4)  Vitamin D (Ergocalciferol) 50000 Unit Caps (Ergocalciferol) .... Take One Tablet Weekly For 8 Weeks. 5)  Flexeril 5 Mg Tabs (Cyclobenzaprine Hcl) .... Take 1 Tab By Mouth At Bedtime 6)  Prilosec 20 Mg Cpdr (Omeprazole) .... Take 1 Tablet By Mouth Once A Day 7)  Isometheptene-Apap-Dichloral 65-325-100 Mg Caps (Apap-Isometheptene-Dichloral) .... Take 2 Caps By Mouth As Needed For Headaches Every 1 Hour. Max of 8 Caps.  Allergies (verified): 1)  ! 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 left tib fib 02/20/09 [place], fracture  Family History: Last updated: 06/04/2009 Family History of Diabetes Family History of Arthritis no breast cancer cousin with ovarian cancer  Social History: Last updated: 06/04/2009 Patient is married.  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    Impression & Recommendations:  Problem # 1:  MIGRAINE HEADACHE (ICD-346.90) What patinet describes is unclear whetehr its a migraine or tension headaches. I discussed  with Dr Coralee Pesa and he agrees athat we should start the patient on midrin whci is known to be helpful in both. I will not start her on any prophylactic meds at this time since her headache is persistent and not episodic. I also discussed the use of benadryl for help with sleeping. The following medications were removed from the medication list:    Treximet 85-500 Mg Tabs (Sumatriptan-naproxen sodium) .Marland Kitchen... Take 1 tablet by mouth every 8 hours for the first 3 days, then use medication as needed for pain. Her updated medication list for this problem includes:    Isometheptene-apap-dichloral 65-325-100 Mg Caps (Apap-isometheptene-dichloral) .Marland Kitchen... Take 2 caps by mouth as needed for headaches every 1 hour. max of 8 caps.  Problem # 2:  Preventive Health Care (ICD-V70.0) discussed preventive screening and she is not interested in flu shot today.  Problem # 3:  HOT FLASHES (ICD-627.2) Continue current meds.  Her updated medication list for this problem includes:    Estrace 1 Mg Tabs (Estradiol) .Marland Kitchen... Take one tablet daily for hot flashes.  Complete Medication List: 1)  Epipen 0.3 Mg/0.57ml Devi (Epinephrine) .... Use if you experience throat tightness or shortness of breath after exposure to allergen. 2)  Estrace 1 Mg Tabs (Estradiol) .... Take one tablet daily for hot flashes. 3)  Calcium 1500 Mg Tabs (Calcium carbonate) .... Take one tablet daily. 4)  Vitamin D (ergocalciferol) 50000 Unit Caps (Ergocalciferol) .... Take one tablet weekly for 8 weeks. 5)  Flexeril 5 Mg Tabs (Cyclobenzaprine hcl) .... Take 1 tab by mouth at bedtime 6)  Prilosec 20 Mg Cpdr (Omeprazole) .... Take 1 tablet by mouth once a day 7)  Isometheptene-apap-dichloral 65-325-100 Mg Caps (Apap-isometheptene-dichloral) .... Take 2 caps by mouth as needed for headaches every 1 hour. max of 8 caps.  Patient Instructions: 1)  Please schedule a follow-up appointment in 3 months. 2)  Please take your prescribed meds as  scheduled. 3)  Take diphenhydramine over the counter as instructed at night to help you sleep. 4)  It is important that you exercise regularly at least 20 minutes 5 times a week. If you develop chest pain, have severe difficulty breathing, or feel very tired , stop exercising immediately and seek medical attention. 5)  Take calcium +Vitamin D daily. 6)  Take 400-600mg  of Ibuprofen (Advil, Motrin) with food every 4-6 hours as needed for relief of pain or comfort of fever. Prescriptions: ISOMETHEPTENE-APAP-DICHLORAL 65-325-100 MG CAPS (APAP-ISOMETHEPTENE-DICHLORAL) Take 2 caps by mouth as needed for headaches every 1 hour. Max of 8 caps.  #60 x 1   Entered and Authorized by:   Lars Mage MD   Signed by:   Lars Mage MD on 09/24/2009   Method used:   Print then Give to Patient   RxID:   [id number] ISOMETHEPTENE-APAP-DICHLORAL 65-325-100 MG CAPS (APAP-ISOMETHEPTENE-DICHLORAL) Take 2 caps by mouth as needed for headaches every 1 hour. Max of 8 caps.  #60 x 1   Entered and Authorized by:   Lars Mage MD   Signed by:   Lars Mage MD on 09/24/2009   Method used:   Print then Give to Patient   RxID:   [id number]   Prevention & Chronic Care Immunizations   Influenza vaccine: Not documented   Influenza vaccine deferral: Deferred  (09/24/2009)    Tetanus booster: Not documented   Td booster deferral: Deferred  (09/24/2009)    Pneumococcal vaccine: Not documented   Pneumococcal vaccine deferral: Deferred  (09/14/2009)  Other Screening   Pap smear: Not documented   Pap smear action/deferral: Not indicated S/P hysterectomy  (05/18/2009)   Smoking status: never  (09/24/2009)  Lipids   Total Cholesterol: 146  (06/05/2009)   LDL: 70  (06/05/2009)   LDL Direct: Not documented   HDL: 58  (06/05/2009)   Triglycerides: 92  (06/05/2009)

## 2010-09-10 NOTE — Assessment & Plan Note (Signed)
Summary: est-ck/fu/meds/cfb   Vital Signs:  Patient profile:   35 year old female Height:      70.5 inches (179.07 cm) Weight:      157.9 pounds (71.77 kg) BMI:     22.42 Temp:     98.0 degrees F oral Pulse rate:   67 / minute BP sitting:   147 / 99  (right arm)  Vitals Entered By: Chinita Pester RN (November 13, 2009 1:38 PM) CC: F/U visit. Change  in medication- med.for hot flashes. Is Patient Diabetic? No Pain Assessment Patient in pain? yes     Location: headache Intensity: 9 Type: aching Onset of pain  Intermittent Nutritional Status BMI of 19 -24 = normal  Have you ever been in a relationship where you felt threatened, hurt or afraid?No   Does patient need assistance? Functional Status Self care Ambulation Normal   Primary Care Provider:  Elby Showers MD  CC:  F/U visit. Change  in medication- med.for hot flashes..  History of Present Illness: 33yof with pmh outlined in this chart here to discuss  1) headaches: classic migraine with photophobia, phonophobia, nausea, fatigue and intense throbbing pain.  She has a head ache now.  She has just decided to stop her estrogen replacement because she thinks that it is causing the headaches.  2) hot flashes: would like something else for control as she does not want to be on estrogen anymore due to headaches.  Also did not like the weight gain and bloating she exeperinced.  3) otherwise, no complaints.  Depression History:      The patient denies diminished interest in her usual daily activities.         Preventive Screening-Counseling & Management  Alcohol-Tobacco     Alcohol drinks/day: 0     Smoking Status: never  Caffeine-Diet-Exercise     Caffeine use/day: 1 a day     Does Patient Exercise: no  Current Medications (verified): 1)  Epipen 0.3 Mg/0.59ml Devi (Epinephrine) .... Use If You Experience Throat Tightness or Shortness of Breath After Exposure To Allergen. 2)  Gabapentin 300 Mg Caps (Gabapentin) ....  Take One Tablet Three Times A Day For Hot Flashes.  Allergies (verified): 1)  ! * Pineapple  Review of Systems       per hpi  Physical Exam  General:  alert and well-developed.   Head:  normocephalic and atraumatic.   Eyes:  painful to shine light, so no exam Mouth:  good dentition and pharynx pink and moist.   Lungs:  normal respiratory effort and normal breath sounds.   Heart:  normal rate, regular rhythm, and no murmur.   Neurologic:  alert & oriented X3, cranial nerves II-XII intact, strength normal in all extremities, and gait normal.   Psych:  Oriented X3, memory intact for recent and remote, normally interactive, and good eye contact.     Impression & Recommendations:  Problem # 1:  MIGRAINE HEADACHE (ICD-346.90) she wonders if these are caused by estrogen replacement, I agree that this is likely.  Migraine is a known side effect of estrogen and the timeframe is suggestive. None of the meds we have tried for headache have worked.  see discussion of hot flashes below.  The following medications were removed from the medication list:    Isometheptene-apap-dichloral 65-325-100 Mg Caps (Apap-isometheptene-dichloral) .Marland Kitchen... Take 2 caps by mouth as needed for headaches every 1 hour. max of 8 caps.  Problem # 2:  HOT FLASHES (ICD-627.2) have tried prozac  and she felt off balance estrogen replacement gives her migraine, bloating and weight gain will try neurontin.  The following medications were removed from the medication list:    Estrace 1 Mg Tabs (Estradiol) .Marland Kitchen... Take one tablet daily for hot flashes.  Problem # 3:  VITAMIN D DEFICIENCY (ICD-268.9) last checked vit D in 10/10 and was low.  will recheck today. she is not taking any suppliment at this time. as she is post surgical menopause I recomended that she should at least be on a calcium supliment.  Orders: T-Vitamin D (25-Hydroxy) [id number])  Complete Medication List: 1)  Epipen 0.3 Mg/0.50ml Devi (Epinephrine)  .... Use if you experience throat tightness or shortness of breath after exposure to allergen. 2)  Gabapentin 300 Mg Caps (Gabapentin) .... Take one tablet three times a day for hot flashes.  Patient Instructions: 1)  You have a new prescription for gabapentin for hot flashes.  Take this as directed. 2)  You had labwork done today, we will call you if anything needs to be addressed before your next appointment. 3)  Please be sure you are taking a calcium suppliment. 4)  Please schedule a follow-up appointment in 6 months. Prescriptions: GABAPENTIN 300 MG CAPS (GABAPENTIN) Take one tablet three times a day for hot flashes.  #96 x 0   Entered and Authorized by:   Elby Showers MD   Signed by:   Elby Showers MD on 11/13/2009   Method used:   Print then Give to Patient   RxID:   [id number]   Prevention & Chronic Care Immunizations   Influenza vaccine: Not documented   Influenza vaccine deferral: Deferred  (09/24/2009)    Tetanus booster: Not documented   Td booster deferral: Deferred  (09/24/2009)    Pneumococcal vaccine: Not documented   Pneumococcal vaccine deferral: Deferred  (09/14/2009)  Other Screening   Pap smear: Not documented   Pap smear action/deferral: Not indicated S/P hysterectomy  (05/18/2009)   Smoking status: never  (11/13/2009)  Lipids   Total Cholesterol: 146  (06/05/2009)   LDL: 70  (06/05/2009)   LDL Direct: Not documented   HDL: 58  (06/05/2009)   Triglycerides: 92  (06/05/2009)  Process Orders Check Orders Results:     [facility]: Check successful Tests Sent for requisitioning (November 13, 2009 2:19 PM):     11/13/2009: [facility] -- T-Vitamin D (25-Hydroxy) [phone] (signed)

## 2010-09-10 NOTE — Assessment & Plan Note (Signed)
Summary: Cough   Vital Signs:  Patient profile:   35 year old female Height:      70.5 inches (179.07 cm) Weight:      161.6 pounds (73.45 kg) BMI:     22.94 Temp:     98.4 degrees F Pulse rate:   60 / minute BP sitting:   152 / 96  (right arm) Cuff size:   regular  Vitals Entered By: Dorie Rank RN (March 20, 2010 10:32 AM)  Serial Vital Signs/Assessments:  Time      Position  BP       Pulse  Resp  Temp     By 10:35 AM  L Arm     153/91                         Dorie Rank RN  CC: about 1 week ago, felt like something in throat - now slightly sore - a couple of times "tissue came up when talking" Is Patient Diabetic? No Pain Assessment Patient in pain? yes     Location: throat Intensity: 8 Type: feels like something sticking me Nutritional Status BMI of 19 -24 = normal  Have you ever been in a relationship where you felt threatened, hurt or afraid?No   Does patient need assistance? Functional Status Self care Ambulation Normal   Primary Care Provider:  Elby Showers MD  CC:  about 1 week ago and felt like something in throat - now slightly sore - a couple of times "tissue came up when talking".  History of Present Illness: 1. sharp, needle-like pain to the front of her throat for 1 week while talking on the phone; lasted for an hour; no radiation; no problems with speaking or swallowing; no drooling; "tender throat;" 8/10in intensity. Pain went away on its own but Sx returned on 03/18/10. While speaking on the phone on 03/18/10 spat up a 05 cm piece white "tissue." Feels chills but has hot flushes chronically sp TAH and oophorectomy  2/2 ovarian cycts in 2009. C/o weight gain of 10 Lbs over 1 year. Denies fever, hematemesis, hemoptysis or any other symptoms.  Preventive Screening-Counseling & Management  Alcohol-Tobacco     Alcohol drinks/day: 0     Smoking Status: never  Caffeine-Diet-Exercise     Caffeine use/day: 1 a day     Does Patient Exercise:  no  Problems Prior to Update: 1)  Elevated Blood Pressure  (ICD-796.2) 2)  Pharyngitis  (ICD-462) 3)  Menopause, Surgical  (ICD-627.4) 4)  Vitamin D Deficiency  (ICD-268.9) 5)  Vaginitis, Atrophic  (ICD-627.3) 6)  Hot Flashes  (ICD-627.2) 7)  Allergy  (ICD-995.3) 8)  Anxiety  (ICD-300.00) 9)  Stiffness of Joint Nec Ankle and Foot  (ICD-719.57) 10)  Closed Fracture of Shaft of Tibia  (ICD-823.20) 11)  Open Fracture of Upper End of Fibula With Tibia  (ICD-823.12) 12)  Family History of Diabetes  (ICD-V18.0) 13)  Osteoarthritis, Hip  (ICD-715.95) 14)  Hidradenitis Suppurativa  (ICD-705.83) 15)  Migraine Headache  (ICD-346.90)  Current Problems (verified): 1)  Pharyngitis  (ICD-462) 2)  Menopause, Surgical  (ICD-627.4) 3)  Vitamin D Deficiency  (ICD-268.9) 4)  Vaginitis, Atrophic  (ICD-627.3) 5)  Hot Flashes  (ICD-627.2) 6)  Allergy  (ICD-995.3) 7)  Anxiety  (ICD-300.00) 8)  Stiffness of Joint Nec Ankle and Foot  (ICD-719.57) 9)  Closed Fracture of Shaft of Tibia  (ICD-823.20) 10)  Open Fracture of Upper End of Fibula With Tibia  (  ICD-823.12) 11)  Family History of Diabetes  (ICD-V18.0) 12)  Osteoarthritis, Hip  (ICD-715.95) 13)  Hidradenitis Suppurativa  (ICD-705.83) 14)  Migraine Headache  (ICD-346.90)  Medications Prior to Update: 1)  Epipen 0.3 Mg/0.50ml Devi (Epinephrine) .... Use If You Experience Throat Tightness or Shortness of Breath After Exposure To Allergen. 2)  Gabapentin 300 Mg Caps (Gabapentin) .... Take One Tablet Three Times A Day For Hot Flashes. 3)  Vitamin D (Ergocalciferol) 50000 Unit Caps (Ergocalciferol) .... Take One Tablet A Week For 8 Weeks. 4)  Calcium 1200-1000 Mg-Unit Chew (Calcium Carbonate-Vit D-Min) .... Take One Suppliment Daily 5)  Divigel 1 Mg/gm Gel (Estradiol) .... Apply .25mg  To Upper Thigh Daily For Treatment of Hot Flashes 6)  Cafergot 1-100 Mg Tabs (Ergotamine-Caffeine) .... Two Tablets At Onset of Headach, Then 1 Tab Every 30 Mins. As  Needed Up To 6x Per Attack and Up To 10 Tabs Per Week  Current Medications (verified): 1)  Epipen 0.3 Mg/0.13ml Devi (Epinephrine) .... Use If You Experience Throat Tightness or Shortness of Breath After Exposure To Allergen. 2)  Gabapentin 300 Mg Caps (Gabapentin) .... Take One Tablet Three Times A Day For Hot Flashes. 3)  Vitamin D (Ergocalciferol) 50000 Unit Caps (Ergocalciferol) .... Take One Tablet A Week For 8 Weeks. 4)  Calcium 1200-1000 Mg-Unit Chew (Calcium Carbonate-Vit D-Min) .... Take One Suppliment Daily 5)  Divigel 1 Mg/gm Gel (Estradiol) .... Apply .25mg  To Upper Thigh Daily For Treatment of Hot Flashes 6)  Cafergot 1-100 Mg Tabs (Ergotamine-Caffeine) .... Two Tablets At Onset of Headach, Then 1 Tab Every 30 Mins. As Needed Up To 6x Per Attack and Up To 10 Tabs Per Week  Allergies (verified): 1)  ! * Pineapple  Directives (verified): 1)  Full Code   Past History:  Risk Factors: Alcohol Use: 0 (03/20/2010) Caffeine Use: 1 a day (03/20/2010) Exercise: no (03/20/2010)  Risk Factors: Smoking Status: never (03/20/2010)  Past medical, surgical, family and social histories (including risk factors) reviewed, and no changes noted (except as noted below).  Past Medical History: Reviewed history from 08/20/2009 and no changes required. Osteoarthritis due to congenital hip deformity Headache, migraine Gastritis, chronic secondary to NSAID use Hidradenitis suppurative Chronic cough Bilat ovarian endometriosis - s/p TAH BSO  Past Surgical History: Reviewed history from 03/05/2009 and no changes required. left ovary removed 1995 right ovary removed 2009. left tib fib 02/20/09 [place], fracture  Family History: Reviewed history from 06/04/2009 and no changes required. Family History of Diabetes Family History of Arthritis no breast cancer cousin with ovarian cancer  Social History: Reviewed history from 06/04/2009 and no changes required. Patient is married.  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 No apparent alopecia or balding. Eyes:  No corneal or conjunctival inflammation noted. EOMI. Perrla. Funduscopic exam benign, without hemorrhages, exudates or papilledema. Vision grossly normal. Ears:  Cerumen bilaterally noted. Nose:  Hypertrophied turbinates bialterally.no nasal discharge, no airflow obstruction, no nasal polyps, and no nasal mucosal lesions.   Mouth:  Oral mucosa and oropharynx without lesions or exudates.  Teeth in good repair. Neck:  No deformities, masses, or tenderness noted. Lungs:  Normal respiratory effort, chest expands symmetrically. Lungs are clear to auscultation, no crackles or wheezes. Heart:  Normal rate and regular rhythm.  S1 and S2 normal without gallop, murmur, click, rub or other extra sounds. Abdomen:  Bowel sounds positive,abdomen soft and non-tender without masses, organomegaly or hernias noted. Msk:  No deformity or scoliosis noted of thoracic or lumbar spine.   Pulses:  R and L carotid,radial,femoral,dorsalis pedis and posterior tibial pulses are full and equal bilaterally Extremities:  No clubbing, cyanosis, edema, or deformity noted with normal full range of motion of all joints.   Neurologic:  No cranial nerve deficits noted. Station and gait are normal. alert & oriented X3 and sensation intact to light touch.   Skin:  Intact without suspicious lesions or rashes Cervical Nodes:  No lymphadenopathy noted Axillary Nodes:  No palpable lymphadenopathy Psych:  Cognition and judgment appear intact. Alert and cooperative with normal attention span and concentration. No apparent delusions, illusions, hallucinations   Impression &  Recommendations:  Problem # 1:  PHARYNGITIS (ICD-462)  Likely viral; given no Virchow triad. Oropharyngeal exam is unremarkable exceptmild erythmato post pharynx. Saline garles three times a day and hs as needed. Hydration. Instructed to call if not improved or woresened  in 48 hours.   Problem # 2:  MIGRAINE HEADACHE (ICD-346.90) Stable. No change in regimen. Her updated medication list for this problem includes:    Cafergot 1-100 Mg Tabs (Ergotamine-caffeine) .Marland Kitchen..Marland Kitchen Two tablets at onset of headach, then 1 tab every 30 mins. as needed up to 6x per attack and up to 10 tabs per week  Headache diary reviewed.  Problem # 3:  ELEVATED BLOOD PRESSURE (ICD-796.2) Patient is likely with essential HTN. Low salt diet, CV exercise discussed. Consider to start HCTZ next visit if continues to be hypertensive. BP today: 152/96 Prior BP: 140/91 (01/22/2010)  Labs Reviewed: Creat: 0.71 (09/14/2009) Chol: 146 (06/05/2009)   HDL: 58 (06/05/2009)   LDL: 70 (06/05/2009)   TG: 92 (06/05/2009)  Instructed in low sodium diet (DASH Handout) and behavior modification.    Complete Medication List: 1)  Epipen 0.3 Mg/0.20ml Devi (Epinephrine) .... Use if you experience throat tightness or shortness of breath after exposure to allergen. 2)  Gabapentin 300 Mg Caps (Gabapentin) .... Take one tablet three times a day for hot flashes. 3)  Vitamin D (ergocalciferol) 50000 Unit Caps (Ergocalciferol) .... Take one tablet a week for 8 weeks. 4)  Calcium 1200-1000 Mg-unit Chew (Calcium carbonate-vit d-min) .... Take one suppliment daily 5)  Divigel 1 Mg/gm Gel (Estradiol) .... Apply .25mg  to upper thigh daily for treatment of hot flashes 6)  Cafergot 1-100 Mg Tabs (Ergotamine-caffeine) .... Two tablets at onset of headach, then 1 tab every 30 mins. as needed up to 6x per attack and up to 10 tabs per week  Other Orders: Tdap => 71yrs IM (16109) Admin 1st Vaccine (60454) TB Skin Test [phone]) Admin of Any Addtl Vaccine  (91478)  Patient Instructions: 1)  Please, use saline garles as instructed. 2)  Stay hydrated. 3)  Avoid drinking cold fluids. 4)  Call with any questions. 5)  Please, return to clinic if no improvment or worse in 1 week. Prescriptions: CAFERGOT 1-100 MG TABS (ERGOTAMINE-CAFFEINE) two tablets at onset of headach, then 1 tab every 30 mins. as needed up to 6x per attack and up to 10 tabs per week  #50 x 0   Entered and Authorized by:   Deatra Robinson MD   Signed by:   Deatra Robinson MD on 03/20/2010   Method used:   Electronically to        [id number] (retail)       [address]       [place], [place]  [address]       Ph: [phone] or [phone]       Fax: [phone]   RxID:   [id number]    Prevention & Chronic Care Immunizations   Influenza vaccine: Not documented   Influenza vaccine deferral: Deferred  (09/24/2009)   Influenza vaccine due: 04/11/2010    Tetanus booster: 03/20/2010: Tdap   Td booster deferral: Deferred  (09/24/2009)   Tetanus booster due: 03/20/2020    Pneumococcal vaccine: Pneumovax  (02/19/2009)   Pneumococcal vaccine deferral: Deferred  (09/14/2009)  Other Screening   Pap smear: Not documented   Pap smear action/deferral: Not indicated S/P hysterectomy  (05/18/2009)   Smoking status: never  (03/20/2010)  Lipids   Total Cholesterol: 146  (06/05/2009)   LDL: 70  (06/05/2009)   LDL Direct: Not documented   HDL: 58  (06/05/2009)   Triglycerides: 92  (06/05/2009)   Lipid panel due: 06/20/2010   Nursing Instructions: Give Tdap PPD ->needs to return on Friday for reading    Immunizations Administered:  Tetanus Vaccine:    Vaccine Type: Tdap    Site: right deltoid    Mfr: GlaxoSmithKline    Dose: 0.5 ml    Route: IM    Given by: Dorie Rank RN    Exp. Date: 02/08/2012    Lot #: [id number]    VIS given: 06/29/07 version given March 20, 2010.  PPD Skin Test:    Vaccine Type: PPD    Site: right  forearm    Mfr: Sanofi Pasteur    Dose: 0.1 ml    Route: ID    Given by: Dorie Rank RN    Exp. Date: 06/13/2011    Lot #: [id number]  Appended Document: Cough   PPD Results    Date of reading: 03/22/2010    Results: < 5mm    Interpretation: negative

## 2010-09-10 NOTE — Progress Notes (Signed)
Summary: phone/gg  Phone Note Call from Patient   Caller: Patient Summary of Call: Pt called with c/o finding a tic on  rt side of neck.  She put alcohol on area and tic fell off.   Pt advised to keep area clean, watch for fever, rash or any changes for 2 weeks and call back for concerns. Patient/caller verbalizes understanding of these instructions.  Initial call taken by: Merrie Roof RN,  February 06, 2010 1:57 PM  Follow-up for Phone Call        agree with plan Follow-up by: Julaine Fusi  DO,  February 06, 2010 3:23 PM

## 2010-09-10 NOTE — Progress Notes (Signed)
Summary: ? change med/ hla  Phone Note Call from Patient   Summary of Call: pt calls stating treximet is over $1000.00, she cannot afford this, can this be changed to something considerably cheaper? she saw dr Gwenlyn Perking for this. she also says her sclera has started to turn yellow, i made an appt for mon 2/14 Initial call taken by: Marin Roberts RN,  September 21, 2009 10:24 AM  Follow-up for Phone Call        Please have patient come in today for stat CMP. Follow-up by: Margarito Liner MD,  September 21, 2009 10:55 AM  Additional Follow-up for Phone Call Additional follow up Details #1::        called pt back, explained need for labs, she states she cannot come in until about 1pm, i instructed her to be here at 1315, she is agreeable. Additional Follow-up by: Marin Roberts RN,  September 21, 2009 11:14 AM    Additional Follow-up for Phone Call Additional follow up Details #2::    i tried to call pt as to why she did not come in and reschedule her, she didnot answer and no machine picked up Follow-up by: Marin Roberts RN,  September 21, 2009 5:40 PM  Additional Follow-up for Phone Call Additional follow up Details #3:: Details for Additional Follow-up Action Taken: noted, is here for OV Additional Follow-up by: Zoila Shutter MD,  September 24, 2009 9:32 AM   Process Orders Check Orders Results:     [facility]: Check successful Tests Sent for requisitioning (September 24, 2009 9:31 AM):     09/21/2009: [facility] -- T-Comprehensive Metabolic Panel [phone] (signed)

## 2010-09-10 NOTE — Progress Notes (Signed)
Summary: medication/gp  Phone Note Outgoing Call   Summary of Call: Pt.stated the new medication, Gabapentin, makes her feel funny, dizzy, and off balance.  She wants to know if she should continue taking it. Initial call taken by: Chinita Pester RN,  November 20, 2009 9:21 AM  Follow-up for Phone Call        Talked to Dr. Clent Ridges; pt. should try taking Gabapentin 100 mg at night and if this does not work to let us know. Pt. called and made awared; said she will try this. Follow-up by: Chinita Pester RN,  November 20, 2009 11:16 AM

## 2010-09-10 NOTE — Progress Notes (Signed)
----   Converted from flag ---- ---- 11/20/2009 9:19 AM, Chinita Pester RN wrote: Pt. was called about new Rx for Vit.D which needs to be taken x 8 weeks per Dr. Clent Ridges. [facility] [place] Also she neeeds to take 1200mg  of Calcium. And to schedule an appt. in 8 weeks to recheck Vit D level.  ---- 11/14/2009 4:36 PM, Chinita Pester RN wrote:   ---- 11/14/2009 4:35 PM, Chinita Pester RN wrote: Pt. was called; no answer.  I will call again tomorrow.  ---- 11/14/2009 3:06 PM, Elby Showers MD wrote: Please let [name] know that her vit D is low again. I have written a prescription for vitamin D 50,000 units to take once a week for 8 weeks.  It needs to be called in to which ever pharmacy she uses.  After the 8 weelks we will check her level again.  She should also be taking at least 1200mg  of calcium daily. Thank you, catherine ------------------------------

## 2010-09-10 NOTE — Assessment & Plan Note (Signed)
Summary: FU VISIT/DS   Vital Signs:  Patient profile:   35 year old female Height:      70.5 inches (179.07 cm) Weight:      149.8 pounds (68.09 kg) BMI:     21.27 O2 Sat:      98 % on Room air Temp:     97.4 degrees F (36.33 degrees C) oral Pulse rate:   65 / minute BP sitting:   138 / 94  (right arm)  Vitals Entered By: Stanton Kidney Ditzler RN (August 20, 2009 11:28 AM)  O2 Flow:  Room air Is Patient Diabetic? No Pain Assessment Patient in pain? yes     Location: head Intensity: 8 Onset of pain  since last PM Nutritional Status BMI of 19 -24 = normal Nutritional Status Detail appetite good  Have you ever been in a relationship where you felt threatened, hurt or afraid?denies   Does patient need assistance? Functional Status Self care Ambulation Normal Comments FU - h/a since last PM - hx og migraine h/a. Problems with chills since 08/18/09.   Primary Care Provider:  Elby Showers MD   History of Present Illness: This is a  year old woman with past medical history of   Osteoarthritis due to congenital hip deformity Headache, migraine Gastritis, chronic secondary to NSAID use Hidradenitis suppurative Chronic cough Bilat ovarian tumor - s/p TAH BSO in 2009   She is here today with headache.  1) headache today with frontal fullness, some chills and hot spells, took a BC but didn't get it to go away.  2) hot flashes and migraines: tried prozac did not like made her sick to her stomach. Still with hot flashes keeping her awake at night. Would like her hormone levels checked to see if she can use an estrogen replacement cream.  3) vaginal dryness: tried premarin cream, it is working.  4) decreased vit D level at last check.     Depression History:      The patient denies a depressed mood most of the day and a diminished interest in her usual daily activities.         Preventive Screening-Counseling & Management  Alcohol-Tobacco     Alcohol drinks/day: 0  Smoking Status: never  Caffeine-Diet-Exercise     Caffeine use/day: 1 a day     Does Patient Exercise: no  Medications Prior to Update: 1)  Ibuprofen 800 Mg Tabs (Ibuprofen) .Marland Kitchen.. 1 By Mouth Three Times A Day 2)  Epipen 0.3 Mg/0.8ml Devi (Epinephrine) .... Use If You Experience Throat Tightness or Shortness of Breath After Exposure To Allergen.  Allergies: 1)  ! * Pineapple  Past History:  Past Medical History: Osteoarthritis due to congenital hip deformity Headache, migraine Gastritis, chronic secondary to NSAID use Hidradenitis suppurative Chronic cough Bilat ovarian endometriosis - s/p TAH BSO  Review of Systems       per hpi  Physical Exam  General:  alert and well-developed.   Lungs:  normal respiratory effort and normal breath sounds.   Heart:  normal rate, regular rhythm, and no murmur.   Psych:  Oriented X3, memory intact for recent and remote, normally interactive, and good eye contact.     Impression & Recommendations:  Problem # 1:  VITAMIN D DEFICIENCY (ICD-268.9) treat with 50,000 International Units'sweekly for 8 weeks and then 1000 units daily after Will also need calcium replacement at 1200 mg daily. Discussed bone health in menopause and the importance of taking these medications.  Problem #  2:  MIGRAINE HEADACHE (ICD-346.90) She will continue to use ibuprofen for headache.  Today symptoms are more likely due to URI.  Her updated medication list for this problem includes:    Ibuprofen 800 Mg Tabs (Ibuprofen) .Marland Kitchen... 1 by mouth three times a day  Problem # 3:  HOT FLASHES (ICD-627.2)  Very bothersome symptoms in a young woman after surgical menopause TAHBSO for endometriosis. As she is young and s/p hysterectomy and with no clotting or CAD risk factors will start estrogen replacement therapy today.  Her updated medication list for this problem includes:    Premarin 0.3 Mg Tabs (Estrogens conjugated) .Marland Kitchen... Take one tablet daily.  Problem # 4:   MENOPAUSE, SURGICAL (ICD-627.4)  TAH BSO for endometriosis.  Left ovary prior to 2002, Right and uterus in 2009 also for endometriosis. As she is young and s/p hysterectomy and with no clotting or CAD risk factors will start estrogen replacement therapy.  Will continue therapy for 6 months and then initiate a taper. If still with symtpoms will increase to 0.625.   Her updated medication list for this problem includes:    Premarin 0.3 Mg Tabs (Estrogens conjugated) .Marland Kitchen... Take one tablet daily.  Complete Medication List: 1)  Ibuprofen 800 Mg Tabs (Ibuprofen) .Marland Kitchen.. 1 by mouth three times a day 2)  Epipen 0.3 Mg/0.47ml Devi (Epinephrine) .... Use if you experience throat tightness or shortness of breath after exposure to allergen. 3)  Premarin 0.3 Mg Tabs (Estrogens conjugated) .... Take one tablet daily. 4)  Calcium 1500 Mg Tabs (Calcium carbonate) .... Take one tablet daily. 5)  Vitamin D (ergocalciferol) 50000 Unit Caps (Ergocalciferol) .... Take one tablet weekly for 8 weeks.  Patient Instructions: 1)  You have new prescriptions for Vitamin D, estrogen replacement tablets and calcium suppliments. 2)  Please schedule a follow-up appointment in 1 month. Prescriptions: VITAMIN D (ERGOCALCIFEROL) 50000 UNIT CAPS (ERGOCALCIFEROL) Take one tablet weekly for 8 weeks.  #8 x 0   Entered and Authorized by:   Elby Showers MD   Signed by:   Elby Showers MD on 08/20/2009   Method used:   Print then Give to Patient   RxID:   [id number] CALCIUM 1500 MG TABS (CALCIUM CARBONATE) Take one tablet daily.  #32 x 11   Entered and Authorized by:   Elby Showers MD   Signed by:   Elby Showers MD on 08/20/2009   Method used:   Print then Give to Patient   RxID:   [id number] PREMARIN 0.3 MG TABS (ESTROGENS CONJUGATED) Take one tablet daily.  #32 x 6   Entered and Authorized by:   Elby Showers MD   Signed by:   Elby Showers MD on 08/20/2009   Method used:   Print then Give to  Patient   RxID:   [id number]

## 2010-09-10 NOTE — Assessment & Plan Note (Signed)
Summary: est-ck/fu/meds/cfb   Vital Signs:  Patient profile:   35 year old female Height:      70.5 inches Weight:      157.2 pounds BMI:     22.32 Temp:     98.5 degrees F oral Pulse rate:   54 / minute BP sitting:   133 / 86  (right arm)  Vitals Entered By: Filomena Jungling NT II (Dec 31, 2009 3:35 PM) CC: headache Is Patient Diabetic? No Pain Assessment Patient in pain? yes     Location: headache Intensity: 5 Type: aching Onset of pain  Intermittent Nutritional Status BMI of 19 -24 = normal  Have you ever been in a relationship where you felt threatened, hurt or afraid?No   Does patient need assistance? Functional Status Self care Ambulation Normal   Primary Care Provider:  Elby Showers MD  CC:  headache.  History of Present Illness: 35yof with pmh outlined in chart.  She is here today to discuss continue headache and hot flashes.  She thinks that her headache is associated with her tooth pain.  She had a tooth extracted years ago and the roots are now exposed which is causing her a lot of pain.  She developed an abcess in this area last week and has just finished a course of amox from urgent care.  She is very concerned about hot flashes.  She has now tried several therapies and nothing is working.  As the weather gets warmer she is unable to go outside without breaking into a sweat that soaks her clothes and makes her very uncomfortable.  She is also worried that she is growing hair on her chin.  Preventive Screening-Counseling & Management  Alcohol-Tobacco     Alcohol drinks/day: 0     Smoking Status: never  Caffeine-Diet-Exercise     Caffeine use/day: 1 a day     Does Patient Exercise: no  Medications Prior to Update: 1)  Epipen 0.3 Mg/0.75ml Devi (Epinephrine) .... Use If You Experience Throat Tightness or Shortness of Breath After Exposure To Allergen. 2)  Gabapentin 300 Mg Caps (Gabapentin) .... Take One Tablet Three Times A Day For Hot Flashes. 3)   Vitamin D (Ergocalciferol) 50000 Unit Caps (Ergocalciferol) .... Take One Tablet A Week For 8 Weeks. 4)  Calcium 1200-1000 Mg-Unit Chew (Calcium Carbonate-Vit D-Min) .... Take One Suppliment Daily  Allergies (verified): 1)  ! * Pineapple  Review of Systems       per hpi  Physical Exam  General:  alert and well-developed.   Head:  normocephalic and atraumatic.   Eyes:  vision grossly intact, pupils equal, pupils round, and pupils reactive to light.   Mouth:  pharynx pink and moist.  there is a missing tooth in the right upper jaw, no swelling or erythema. Lungs:  normal respiratory effort and normal breath sounds.   Heart:  normal rate, regular rhythm, and no murmur.   Extremities:  no edema Neurologic:  alert & oriented X3, cranial nerves II-XII intact, strength normal in all extremities, and gait normal.   Psych:  Oriented X3, memory intact for recent and remote, normally interactive, good eye contact, and not anxious appearing.     Impression & Recommendations:  Problem # 1:  HOT FLASHES (ICD-627.2) This has been a very difficult issue for Ms. Suder.  Her symptoms are severe and interfere with daily function.  Problem # 2:  MENOPAUSE, SURGICAL (ICD-627.4) Experiencing hot flashes, hirsutism and mood swings after surgical menopause.  So  far we have tried premarin which caused migraines paroxitine which made her feel emotionally off balance neurontin which makes her dizzy so she is only taking a small dose, and this is not effective for symptoms control. I am on a gyn rotation and will confer with those attendings on further thoughts on managment.  Problem # 3:  VITAMIN D DEFICIENCY (ICD-268.9) She is taking vit D and calcium.  We just check vit D level one month ago.  Consider rechecking in another month or so.  Problem # 4:  MIGRAINE HEADACHE (ICD-346.90) none since stopping premarin.  Complete Medication List: 1)  Epipen 0.3 Mg/0.71ml Devi (Epinephrine) .... Use if  you experience throat tightness or shortness of breath after exposure to allergen. 2)  Gabapentin 300 Mg Caps (Gabapentin) .... Take one tablet three times a day for hot flashes. 3)  Vitamin D (ergocalciferol) 50000 Unit Caps (Ergocalciferol) .... Take one tablet a week for 8 weeks. 4)  Calcium 1200-1000 Mg-unit Chew (Calcium carbonate-vit d-min) .... Take one suppliment daily  Patient Instructions: 1)  I will call you later this week with new plans for managment of hot flashes.   Prevention & Chronic Care Immunizations   Influenza vaccine: Not documented   Influenza vaccine deferral: Deferred  (09/24/2009)    Tetanus booster: Not documented   Td booster deferral: Deferred  (09/24/2009)    Pneumococcal vaccine: Not documented   Pneumococcal vaccine deferral: Deferred  (09/14/2009)  Other Screening   Pap smear: Not documented   Pap smear action/deferral: Not indicated S/P hysterectomy  (05/18/2009)   Smoking status: never  (12/31/2009)  Lipids   Total Cholesterol: 146  (06/05/2009)   LDL: 70  (06/05/2009)   LDL Direct: Not documented   HDL: 58  (06/05/2009)   Triglycerides: 92  (06/05/2009)  Appended Document: est-ck/fu/meds/cfb    Clinical Lists Changes  Problems: Assessed HOT FLASHES as comment only - I have discussed this with Dr. Emelda Fear ob/gyn and looked for further options for Ms. Arata.  HRT does seem to be the most effective treatment, but has caused her migraines in the past.  There are new products, specifically, estradiol gel, which is effective at a much lower dose and does not seem to cause the same side effects as the oral preparations.  I will try this.  The big draw back is that it is expensive and I am not sure she will be able to afford it. Her updated medication list for this problem includes:    Divigel 1 Mg/gm Gel (Estradiol) .Marland Kitchen... Apply .25mg  to upper thigh daily for treatment of hot flashes  Medications: Added new medication of DIVIGEL 1 MG/GM  GEL (ESTRADIOL) Apply .25mg  to upper thigh daily for treatment of hot flashes - Signed Rx of DIVIGEL 1 MG/GM GEL (ESTRADIOL) Apply .25mg  to upper thigh daily for treatment of hot flashes;  #1 month x 1;  Signed;  Entered by: Elby Showers MD;  Authorized by: Elby Showers MD;  Method used[facility] [phone]*, [address], [place], [place], [place]  [address], Ph: [phone] or [phone], Fax: [phone]    Prescriptions: DIVIGEL 1 MG/GM GEL (ESTRADIOL) Apply .25mg  to upper thigh daily for treatment of hot flashes  #1 month x 1   Entered and Authorized by:   Elby Showers MD   Signed by:   Elby Showers MD on 01/10/2010   Method used:   [facility]        [facility]  [address] [phone]* (  retail)       [address]       [place], [place]  [address]       Ph: [phone] or [phone]       Fax: [phone]   RxID:   [id number]     Impression & Recommendations:  Problem # 1:  HOT FLASHES (ICD-627.2) I have discussed this with Dr. Emelda Fear ob/gyn and looked for further options for Ms. Tall.  HRT does seem to be the most effective treatment, but has caused her migraines in the past.  There are new products, specifically, estradiol gel, which is effective at a much lower dose and does not seem to cause the same side effects as the oral preparations.  I will try this.  The big draw back is that it is expensive and I am not sure she will be able to afford it. Her updated medication list for this problem includes:    Divigel 1 Mg/gm Gel (Estradiol) .Marland Kitchen... Apply .25mg  to upper thigh daily for treatment of hot flashes   Complete Medication List: 1)  Epipen 0.3 Mg/0.68ml Devi (Epinephrine) .... Use if you experience throat tightness or shortness of breath after exposure to allergen. 2)  Gabapentin 300 Mg Caps (Gabapentin) .... Take one tablet three times a day for hot flashes. 3)  Vitamin D (ergocalciferol) 50000 Unit Caps  (Ergocalciferol) .... Take one tablet a week for 8 weeks. 4)  Calcium 1200-1000 Mg-unit Chew (Calcium carbonate-vit d-min) .... Take one suppliment daily 5)  Divigel 1 Mg/gm Gel (Estradiol) .... Apply .25mg  to upper thigh daily for treatment of hot flashes

## 2010-09-10 NOTE — Assessment & Plan Note (Signed)
Summary: 3 M RE-CK/XRAY/LT TIB FIB/AETNA/MEDICARE/CAF   Visit Type:  Follow-up Primary Provider:  Elby Showers MD  CC:  fractured LEFT tibia followup visit.  History of Present Illness: I saw [name] in the office today for a followup visit.  She is a 35 years old woman who fractured her LEFT tibia in July 2010 and had an intramedullary nail.  She is currently on ibuprofen 800 mg on a p.r.n. basis which helps with her pain.  She has some sharp throbbing pain near the fracture.  She complains of numbness around her LEFT knee and medial ankle since the surgery and some swelling in the medial calf.  6 mos post op   Review of systems musculoskeletal other than stated no other problems  Physical examination the surgical incisions have healed nicely.  She has some numbness RIGHT over the incision on the knee and RIGHT over the incision on the medial ankle.  Her knee range of motion is returned to normal and her ankle motion is 95% of normal.  The fracture site is nontender.  Strength in the ankle and knee extensors normal.  Knee and ankle stable.  Ambulation normal.  Normal pulses.  We did repeat the x-rays 2 views of the LEFT tibia  The long tibial nail with proximal distal locking the fracture site has healed  Impression healed tibial fracture with intramedullary nail.  Note the alignment is anatomic.  All questions were answered and the patient is advised to come back if any problems with the hardware         Allergies: 1)  ! * Pineapple   Impression & Recommendations:  Problem # 1:  CLOSED FRACTURE OF SHAFT OF TIBIA (ICD-823.20) Assessment Improved  Orders: Est. Patient Level III ([address]) Tibia/Fibula x-ray,  2 views ([address])  Patient Instructions: 1)  Your fracture is completely healed, implant looks good 2)  come back as needed

## 2010-09-10 NOTE — Progress Notes (Signed)
Summary: Hormone  Phone Note Call from Patient   Caller: Patient Call For: Elby Showers MD Summary of Call: [facility] [place] wants to get a hormone that is a little less expensive.  Cannot afford the prescription for the hormone.Angelina Ok RN  August 20, 2009 4:56 PM  Initial call taken by: Angelina Ok RN,  August 20, 2009 4:56 PM  Follow-up for Phone Call        I have called to follow up with her pharmacy and the premarin will cost her $60 monthly. This is due to her insurance deductable.   Will switch to estradiol 1 mg daily which is available at [facility] for $4 with the same plan for follow up i.e taper off in 6 months and see if we can wean her off.  Additional Follow-up for Phone Call Additional follow up Details #1::        Call to pt to notify her that her "hormone" medication has been changed.  Message left at pt  to call the Clinics. Additional Follow-up by: Angelina Ok RN,  August 27, 2009 10:32 AM    New/Updated Medications: ESTRACE 1 MG TABS (ESTRADIOL) Take one tablet daily for hot flashes. Prescriptions: ESTRACE 1 MG TABS (ESTRADIOL) Take one tablet daily for hot flashes.  #32 x 3   Entered and Authorized by:   Elby Showers MD   Signed by:   Merrie Roof RN on 08/27/2009   Method used:   Electronically to        [facility] [phone]* (retail)       [address]       [place], [place]  [address]       Ph: [phone] or [phone]       Fax: [phone]   RxID:   [id number]

## 2010-09-10 NOTE — Progress Notes (Signed)
Summary: med change/gp  Phone Note Refill Request Message from:  Pharmacy on March 22, 2010 1:44 PM  Refills Requested: Medication #1:  CAFERGOT 1-100 MG TABS two tablets at onset of headach Cafergot and  Midrin  are  no longer available; can u change to something else.  Thanks   Method Requested: Electronic Initial call taken by: Chinita Pester RN,  March 22, 2010 1:46 PM  Follow-up for Phone Call        Rx completed in Dr. Tiajuana Amass Follow-up by: Deatra Robinson MD,  March 22, 2010 1:58 PM    New/Updated Medications: TRAMADOL HCL 50 MG TABS (TRAMADOL HCL) four times a day as needed Prescriptions: TRAMADOL HCL 50 MG TABS (TRAMADOL HCL) four times a day as needed  #120 x 6   Entered and Authorized by:   Deatra Robinson MD   Signed by:   Deatra Robinson MD on 03/22/2010   Method used:   Electronically to        [facility] [phone]* (retail)       [address]       [place], [place]  [address]       Ph: [phone] or [phone]       Fax: [phone]   RxID:   [id number]

## 2010-09-10 NOTE — Miscellaneous (Signed)
Summary: pneumonia vaccine in hospital  Clinical Lists Changes  Observations: Added new observation of PNEUMOVAX: Pneumovax (02/19/2009 8:28)      Immunization History:  Pneumovax Immunization History:    Pneumovax:  pneumovax (02/19/2009)

## 2010-09-12 ENCOUNTER — Other Ambulatory Visit (INDEPENDENT_AMBULATORY_CARE_PROVIDER_SITE_OTHER): Payer: Managed Care, Other (non HMO)

## 2010-09-12 ENCOUNTER — Encounter: Payer: Self-pay | Admitting: Ophthalmology

## 2010-09-12 DIAGNOSIS — Z1211 Encounter for screening for malignant neoplasm of colon: Secondary | ICD-10-CM

## 2010-09-12 DIAGNOSIS — R1084 Generalized abdominal pain: Secondary | ICD-10-CM

## 2010-09-12 NOTE — Progress Notes (Signed)
  Subjective:    Patient ID: [name], female    DOB: Jun 10, 1976, 35 y.o.   MRN: [id number]  HPI    Review of Systems     Objective:   Physical Exam           Assessment & Plan:   This encounter was created in error - please disregard.

## 2010-09-12 NOTE — Progress Notes (Signed)
Summary: phone/gg  Phone Note Call from Patient   Summary of Call: Pt called asking to be seen for back pain.  Onset  4 days ago.  No known injury.  Pain in lower back, this is not new but more intense.  Pain does radiate to both legs, thigh area. She has tried IBU with no relief.  Will call pt if appointment opens up, if not she will go to [facility] for evaluation Initial call taken by: Merrie Roof RN,  August 08, 2010 10:18 AM  Follow-up for Phone Call        agree with plan Follow-up by: Julaine Fusi  DO,  August 08, 2010 2:07 PM

## 2010-09-12 NOTE — Miscellaneous (Signed)
Summary: [facility]   Imported By: Margie Billet 04/25/2010 11:19:52  _____________________________________________________________________  External Attachment:    Type:   Image     Comment:   External Document

## 2010-09-12 NOTE — Assessment & Plan Note (Signed)
Vital Signs:  Patient profile:   35 year old [name] Height:      70.5 inches Weight:      166.2 pounds BMI:     23.60 Temp:     97.8 degrees F oral Pulse rate:   79 / minute BP sitting:   130 / 80  (right arm)  Vitals Entered By: Filomena Jungling NT II (September 06, 2010 3:55 PM) CC: FOLLOWUPVISIT- STOMACH AND BACK PAIN NO BETTER Is Patient Diabetic? No Pain Assessment Patient in pain? yes     Location: STOMACH ,BACK Intensity: 10 Type: aching Onset of pain  SINCE CH Nutritional Status BMI of 19 -24 = normal  Have you ever been in a relationship where you felt threatened, hurt or afraid?No   Does patient need assistance? Functional Status Self care Ambulation Normal   Primary Care Provider:  [name]  CC:  FOLLOWUPVISIT- STOMACH AND BACK PAIN NO BETTER.  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 - no relation to food, BMs - assoicate with nasuea.  - no fever, chills, black or bloody stool, or urniary complaints -she has not had menstrual cycle in 2 s after a hysterectomy and BSO for cyst/cencer? she is not sure.  - radiates to back.     Preventive Screening-Counseling & Management  Alcohol-Tobacco     Alcohol drinks/day: 0     Smoking Status: never  Caffeine-Diet-Exercise     Caffeine use/day: 1 a day     Does Patient Exercise: no  Current Medications (verified): 1)  Epipen 0.3 Mg/0.44ml Devi (Epinephrine) .... Use If You Experience Throat Tightness or Shortness of Breath After Exposure To Allergen. 2)  Vitamin D (Ergocalciferol) 50000 Unit Caps (Ergocalciferol) .... Take One Tablet A Week For 8  Weeks. 3)  Calcium 1200-1000 Mg-Unit Chew (Calcium Carbonate-Vit D-Min) .... Take One Suppliment Daily 4)  Divigel 1 Mg/gm Gel (Estradiol) .... Apply .25mg  To Upper Thigh Daily For Treatment of Hot Flashes  Allergies (verified): 1)  ! * Pineapple  Review of Systems       The patient complains of abdominal pain and difficulty walking.  The patient denies anorexia, fever, weight loss, weight gain, vision loss, decreased hearing, hoarseness, chest pain, syncope, dyspnea on exertion, peripheral edema, prolonged cough, headaches, hemoptysis, melena, hematochezia, severe indigestion/heartburn, hematuria, incontinence, genital sores, muscle weakness, suspicious skin lesions, transient blindness, depression, unusual weight change, abnormal bleeding, enlarged lymph nodes, angioedema, breast masses, and testicular masses.    Physical Exam  General:  Gen: VS reveiwed, Alert, well developed, nodistress ENT: mucous membranes pink & moist. No abnormal finds in ear and nose. CVC:S1 S2 , no murmurs, no abnormal heart sounds. Lungs: Clear to auscultation B/L. No wheezes, crackles or other abnormal sounds Abdomen: soft, non distended,  diffuse tenderness in the eblly. Normal Bowel sounds EXT: no pitting edema, no engorged veins, Pulsations normal  Neuro:alert, oriented *3, cranial nerved 2-Diana intact, strenght normal in all  extremities, senstations normal to light touch.      Impression & Recommendations:  Problem # 1:  ABDOMINAL PAIN, GENERALIZED (ICD-789.07) diffuse dull abd pain for  2 mnth, no agg/rel factors, no bowel movement abdnormality, mild nausea, no wt loss or gi bleed noticed.  associate with back pain  I am not sure if this is a musckuloskeletal pain originiating from back because of arthritis (her cT scan of back 4-5 yrs ago showed mild arthrits) or is an abdominal pathology. She has significant abd tenderness today so will begin with a complete workup of her abdomen include labs and CT scan  with contrast. If these test do not point at the diagnosis, will MRI/xray her back to see if she has arthritis (must be severe enough to cause this much pain)  Will give tramadol for pain until then (avoiding NSAIDS as am not sure if she has GI bleed or ulcer).   Orders: T-CMP with Estimated GFR ([address]) T-CBC w/Diff ([address]81-19147) T-Lipase [id number]) T-Urinalysis ([address]84-69629) CT with & without Contrast (CT w&w/o Contrast) T-Drug Screen-Urine, (single) ([address]41-32440) T-Hemoccult Card-Multiple (take home) ([address]) Hemoccult cards  I have a reviewed the previous records including hospital and ED records, radiographs and lab values. Spent more than 35 minutes discussing the importance of medication compliance, managment of chronic diseases and lifestyle changes with the patient.    Complete Medication List: 1)  Epipen 0.3 Mg/0.72ml Devi (Epinephrine) .... Use if you experience throat tightness or shortness of breath after exposure to allergen. 2)  Vitamin D (ergocalciferol) 50000 Unit Caps (Ergocalciferol) .... Take one tablet a week for 8 weeks. 3)  Calcium 1200-1000 Mg-unit Chew (Calcium carbonate-vit d-min) .... Take one suppliment daily 4)  Divigel 1 Mg/gm Gel (Estradiol) .... Apply .25mg  to upper thigh daily for treatment of hot flashes 5)  Tramadol-acetaminophen 37.5-325 Mg Tabs (Tramadol-acetaminophen) .Marland Kitchen.. 1 tablett every 6 hrs as needed for pain  Patient Instructions: 1)  Please schedule a follow-up appointment in 1 week. Prescriptions: TRAMADOL-ACETAMINOPHEN 37.5-325 MG TABS (TRAMADOL-ACETAMINOPHEN) 1 tablett every 6 hrs as needed for pain  #60 x 0   Entered and Authorized by:   Bethel Born MD   Signed by:   Bethel Born MD on 09/06/2010   Method used:   Reprint   RxID:   [id number] TRAMADOL-ACETAMINOPHEN 37.5-325 MG TABS (TRAMADOL-ACETAMINOPHEN) 1 tablett every 6 hrs as needed for pain  #60 x 0   Entered and Authorized by:   Bethel Born MD   Signed by:   Bethel Born MD on 09/06/2010   Method used:   Electronically to        [facility] [phone]* (retail)       [address]       [place], [place]  [address]       Ph: [phone] or [phone]       Fax: [phone]   RxID:   [id number] TRAMADOL-ACETAMINOPHEN 37.5-325 MG TABS (TRAMADOL-ACETAMINOPHEN) 1 tablett every 6 hrs as needed for pain  #60 x 0   Entered and Authorized by:   Bethel Born MD   Signed by:   Bethel Born MD on 09/06/2010   Method used:   Reprint   RxID:   [id number] TRAMADOL-ACETAMINOPHEN 37.5-325 MG TABS (TRAMADOL-ACETAMINOPHEN) 1 tablett every 6 hrs as needed for pain  #60 x 0   Entered and Authorized by:   Bethel Born MD   Signed by:   Bethel Born MD on 09/06/2010   Method used:   Electronically to        [facility] [phone]* (retail)       [phone]  [address]       [place], [place]  [address]       Ph: [phone]       Fax: [phone]   RxID:   [id number]    Orders Added: 1)  T-CMP with Estimated GFR [[address]] 2)  T-CBC w/Diff [84132-44010] 3)  T-Lipase [83690-23215] 4)  T-Urinalysis [81003-65000] 5)  CT with & without Contrast [CT w&w/o Contrast] 6)  T-Drug Screen-Urine, (single) [80101-82900] 7)  T-Hemoccult Card-Multiple (take home) [82270] 8)  Est. Patient Level IV [[address]]    Prevention & Chronic Care Immunizations   Influenza vaccine: Not documented   Influenza vaccine deferral: Deferred  (09/24/2009)   Influenza vaccine due: 04/11/2010    Tetanus booster: 03/20/2010: Tdap   Td booster deferral: Deferred  (09/24/2009)   Tetanus booster due: 03/20/2020    Pneumococcal vaccine: Pneumovax  (07/Diana/2010)   Pneumococcal vaccine deferral: Deferred  (09/14/2009)  Other Screening   Pap smear: Not documented   Pap smear action/deferral: Not indicated S/P hysterectomy  (05/18/2009)   Smoking status: never  (09/06/2010)  Lipids   Total Cholesterol: 146  (06/05/2009)   LDL:  70  (06/05/2009)   LDL Direct: Not documented   HDL: 58  (06/05/2009)   Triglycerides: 92  (06/05/2009)   Lipid panel due: 06/20/2010   Process Orders Check Orders Results:     [facility]: Check successful Tests Sent for requisitioning (September 06, 2010 5:20 PM):     09/06/2010: [facility] -- T-CMP with Estimated GFR [[id number]] (signed)     09/06/2010: [facility] -- T-CBC w/Diff [[id number]] (signed)     09/06/2010: [facility] -- T-Lipase [phone] (signed)     09/06/2010: [facility] -- T-Urinalysis [[id number]] (signed)     09/06/2010: [facility] -- T-Drug Screen-Urine, (single) [[id number]] (signed)

## 2010-09-12 NOTE — Assessment & Plan Note (Signed)
Summary: BACK PAIN /U/C SB.   Vital Signs:  Patient profile:   35 year old female Height:      70.5 inches Weight:      163.5 pounds BMI:     23.21 Temp:     97.6 degrees F oral Pulse rate:   56 / minute BP sitting:   137 / 92  (right arm)  Vitals Entered By: Filomena Jungling NT II (August 15, 2010 8:39 AM) CC: followup urgent care for uti/ still have back pain and headache and stomach Is Patient Diabetic? No Pain Assessment Patient in pain? yes     Location: head, stomach, and headache Intensity: 7 Type: aching  Have you ever been in a relationship where you felt threatened, hurt or afraid?No   Does patient need assistance? Functional Status Self care Ambulation Normal   Primary Care Provider:  Elby Showers MD  CC:  followup urgent care for uti/ still have back pain and headache and stomach.  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  She describes her back pain as dull achy kind of discomfort, located in the lower back back with some radiation to her belly and thighs. Denies any h/o trauma. She says that it feels funny when she urinates,  but denies any dysuria, frequency. She is unclear if she has urgency. She endorses some nausea for 3 days. But denies any vomiting.    Preventive Screening-Counseling & Management  Alcohol-Tobacco     Alcohol drinks/day: 0     Smoking Status: never  Caffeine-Diet-Exercise     Caffeine use/day: 1 a day     Does Patient Exercise: no  Current Medications (verified): 1)  Epipen 0.3 Mg/0.80ml Devi (Epinephrine) .... Use If You Experience Throat Tightness or Shortness of Breath After Exposure To Allergen. 2)  Gabapentin 300 Mg Caps (Gabapentin) .... Take One Tablet Three Times A Day For Hot Flashes. 3)   Vitamin D (Ergocalciferol) 50000 Unit Caps (Ergocalciferol) .... Take One Tablet A Week For 8 Weeks. 4)  Calcium 1200-1000 Mg-Unit Chew (Calcium Carbonate-Vit D-Min) .... Take One Suppliment Daily 5)  Divigel 1 Mg/gm Gel (Estradiol) .... Apply .25mg  To Upper Thigh Daily For Treatment of Hot Flashes 6)  Tramadol Hcl 50 Mg Tabs (Tramadol Hcl) .... Four Times A Day As Needed  Allergies (verified): 1)  ! 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 left tib fib 02/20/09 [place], fracture  Family History: Last updated: 06/04/2009 Family History of Diabetes Family History of Arthritis no breast cancer cousin with ovarian cancer  Social History: Last updated: 06/04/2009 Patient is married.  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  The patient denies fever, decreased hearing, hoarseness, chest pain, syncope, dyspnea on exertion, peripheral edema, melena,  and hematochezia.    Physical Exam  General:  alert, well-developed, well-nourished, and well-hydrated.   Head:  normocephalic, atraumatic, no abnormalities observed, and no abnormalities palpated.   Eyes:  vision grossly intact, pupils equal, pupils round, and pupils reactive to light.   Mouth:  pharynx pink and moist.   Neck:  supple and full ROM.   Lungs:  normal respiratory effort, no intercostal retractions, no accessory muscle use, normal  breath sounds, no dullness, no fremitus, no crackles, and no wheezes.   Heart:  normal rate, regular rhythm, no murmur, no gallop, no rub, and no JVD.   Abdomen:  soft, non-tender, normal bowel sounds, no distention, no masses, no guarding, no rigidity, and no rebound tenderness.   Msk:  SLRT was positive b/l. Pulses:  2+pulses b/l. Extremities:  no cyanosis, clubbing or edema. Neurologic:  alert & oriented X3, cranial nerves II-XII intact, strength normal in all extremities, sensation intact to light touch, sensation intact to pinprick, gait normal, and DTRs symmetrical and normal.     Impression & Recommendations:  Problem # 1:  BACK PAIN (ICD-724.5) Assessment Comment Only She complains of back pain for 2 weeks, radiating  to thighs and lower abdomen. She endorses some nausea but denies vomiting, dysuria,urgency , frequency .At the urget care she was treated for UTI with 3 days of bactrim . We did a repeat dipstick at our clinic that was positive for trace leucocytes. She has a h/o  protrusio actabuli  and has been evalauted at [facility] clinic for that(as per 2008 records). DD include lumbar muscle strain vs protrusio acetabuli vs UTI vs DJD . UTI has been ruled out at today 's visit. But was adviced to drink plenty of fluids. Will not get any imaging at this point.  Will treat her back pain with pain meds and heat compresses. She was offered pain meds but she refused. She was instructed not to do strenous exercises for few weeks. The following medications were removed from the medication list:    Tramadol Hcl 50 Mg Tabs (Tramadol hcl) .Marland Kitchen... Four times a day as needed  Complete Medication List: 1)  Epipen 0.3 Mg/0.49ml Devi (Epinephrine) .... Use if you experience throat tightness or shortness of breath after exposure to allergen. 2)  Vitamin D (ergocalciferol) 50000 Unit Caps (Ergocalciferol) .... Take one tablet a week for 8 weeks. 3)  Calcium 1200-1000 Mg-unit Chew (Calcium  carbonate-vit d-min) .... Take one suppliment daily 4)  Divigel 1 Mg/gm Gel (Estradiol) .... Apply .25mg  to upper thigh daily for treatment of hot flashes  Other Orders: T-Urinalysis Dipstick only ([id number])  Patient Instructions: 1)  Please schedule a follow-up appointment in 2 months. 2)  Most patients (90%) with low back pain will improve with time (2-6 weeks). Keep active but avoid activities that are painful. Apply moist heat and/or ice to lower back several times a day.   Orders Added: 1)  T-Urinalysis Dipstick only [[id number]] 2)  Est. Patient Level III [14782]    Prevention & Chronic Care Immunizations   Influenza vaccine: Not documented   Influenza vaccine deferral: Deferred  (09/24/2009)   Influenza vaccine due: 04/11/2010    Tetanus booster: 03/20/2010: Tdap   Td booster deferral: Deferred  (09/24/2009)   Tetanus booster due: 03/20/2020    Pneumococcal vaccine: Pneumovax  (02/19/2009)   Pneumococcal vaccine deferral: Deferred  (09/14/2009)  Other Screening   Pap smear: Not documented   Pap smear action/deferral: Not indicated S/P hysterectomy  (05/18/2009)  Smoking status: never  (08/15/2010)  Lipids   Total Cholesterol: 146  (06/05/2009)   LDL: 70  (06/05/2009)   LDL Direct: Not documented   HDL: 58  (06/05/2009)   Triglycerides: 92  (06/05/2009)   Lipid panel due: 06/20/2010    Laboratory Results   Urine Tests  Date/Time Received: August 15, 2010 8:52 AM Date/Time Reported: [facility] NT II  August 15, 2010 8:52 AM   Routine Urinalysis   Color: lt. yellow Appearance: Clear Glucose: negative   (Normal Range: Negative) Bilirubin: negative   (Normal Range: Negative) Ketone: negative   (Normal Range: Negative) Spec. Gravity: 1.020   (Normal Range: 1.003-1.035) Blood: trace-lysed   (Normal Range: Negative) pH: 5.5   (Normal Range: 5.0-8.0) Protein: negative   (Normal Range: Negative) Urobilinogen: 0.2   (Normal Range: 0-1) Nitrite: negative    (Normal Range: Negative) Leukocyte Esterace: trace   (Normal Range: Negative)

## 2010-09-13 ENCOUNTER — Ambulatory Visit (HOSPITAL_COMMUNITY)
Admission: RE | Admit: 2010-09-13 | Discharge: 2010-09-13 | Disposition: A | Discharge status: Home / Self Care | Payer: Managed Care, Other (non HMO) | Source: Ambulatory Visit | Attending: Internal Medicine | Admitting: Internal Medicine

## 2010-09-13 ENCOUNTER — Other Ambulatory Visit: Payer: Self-pay | Admitting: Internal Medicine

## 2010-09-13 ENCOUNTER — Other Ambulatory Visit (HOSPITAL_COMMUNITY): Payer: Self-pay

## 2010-09-13 ENCOUNTER — Encounter (HOSPITAL_COMMUNITY): Payer: Self-pay

## 2010-09-13 DIAGNOSIS — M549 Dorsalgia, unspecified: Secondary | ICD-10-CM | POA: Insufficient documentation

## 2010-09-13 DIAGNOSIS — M169 Osteoarthritis of hip, unspecified: Secondary | ICD-10-CM | POA: Insufficient documentation

## 2010-09-13 DIAGNOSIS — G43909 Migraine, unspecified, not intractable, without status migrainosus: Secondary | ICD-10-CM

## 2010-09-13 DIAGNOSIS — M1610 Unilateral primary osteoarthritis, unspecified hip: Secondary | ICD-10-CM | POA: Insufficient documentation

## 2010-09-13 DIAGNOSIS — R109 Unspecified abdominal pain: Secondary | ICD-10-CM | POA: Insufficient documentation

## 2010-09-13 MED ORDER — IOHEXOL 300 MG/ML  SOLN: 100.0000 mL | Freq: Once | INTRAMUSCULAR | Status: AC | PRN

## 2010-09-19 ENCOUNTER — Telehealth: Payer: Self-pay | Admitting: *Deleted

## 2010-09-19 NOTE — Telephone Encounter (Signed)
Patient should be seen and evaluated; if we have no appointments available, would advise that she go to urgent care.

## 2010-09-19 NOTE — Telephone Encounter (Signed)
Pt called and will go to [facility]. She will make f/u appointment here to get results of CT

## 2010-09-19 NOTE — Telephone Encounter (Signed)
Pt had CT ( lower abdominal  )  last Thursday and she has been having  headachse since then.  Also her arm is black and blue, about 1/2 of arm. She had pain in her arm during the procedure, esp when line was flushed. Headache is not going away. She is taking IBU 800 mg every 8 hours. She did have previous CT's without any problem. She wants to know if this is normal.

## 2010-09-27 ENCOUNTER — Encounter: Payer: Self-pay | Admitting: Orthopedic Surgery

## 2010-10-02 ENCOUNTER — Encounter: Payer: Self-pay | Admitting: Internal Medicine

## 2010-10-02 DIAGNOSIS — Z0000 Encounter for general adult medical examination without abnormal findings: Secondary | ICD-10-CM

## 2010-10-02 DIAGNOSIS — M169 Osteoarthritis of hip, unspecified: Secondary | ICD-10-CM

## 2010-10-02 DIAGNOSIS — E559 Vitamin D deficiency, unspecified: Secondary | ICD-10-CM

## 2010-10-02 DIAGNOSIS — S82209A Unspecified fracture of shaft of unspecified tibia, initial encounter for closed fracture: Secondary | ICD-10-CM

## 2010-10-02 DIAGNOSIS — N952 Postmenopausal atrophic vaginitis: Secondary | ICD-10-CM

## 2010-10-02 DIAGNOSIS — E8941 Symptomatic postprocedural ovarian failure: Secondary | ICD-10-CM

## 2010-10-02 DIAGNOSIS — F411 Generalized anxiety disorder: Secondary | ICD-10-CM

## 2010-10-02 DIAGNOSIS — M25673 Stiffness of unspecified ankle, not elsewhere classified: Secondary | ICD-10-CM

## 2010-10-02 DIAGNOSIS — R030 Elevated blood-pressure reading, without diagnosis of hypertension: Secondary | ICD-10-CM

## 2010-10-02 DIAGNOSIS — G43909 Migraine, unspecified, not intractable, without status migrainosus: Secondary | ICD-10-CM

## 2010-10-02 DIAGNOSIS — S82109B Unspecified fracture of upper end of unspecified tibia, initial encounter for open fracture type I or II: Secondary | ICD-10-CM

## 2010-10-02 DIAGNOSIS — R1084 Generalized abdominal pain: Secondary | ICD-10-CM

## 2010-10-02 DIAGNOSIS — M549 Dorsalgia, unspecified: Secondary | ICD-10-CM

## 2010-10-03 ENCOUNTER — Encounter: Payer: Self-pay | Admitting: Internal Medicine

## 2010-10-03 DIAGNOSIS — Z0000 Encounter for general adult medical examination without abnormal findings: Secondary | ICD-10-CM | POA: Insufficient documentation

## 2010-10-12 ENCOUNTER — Encounter: Payer: Self-pay | Admitting: Internal Medicine

## 2010-10-21 LAB — POCT URINALYSIS DIPSTICK
Ketones, ur: NEGATIVE mg/dL
Protein, ur: NEGATIVE mg/dL
Specific Gravity, Urine: 1.02 (ref 1.005–1.030)
Urobilinogen, UA: 0.2 mg/dL (ref 0.0–1.0)
pH: 5.5 (ref 5.0–8.0)

## 2010-10-22 ENCOUNTER — Ambulatory Visit: Payer: Self-pay | Admitting: Internal Medicine

## 2010-11-07 NOTE — Letter (Signed)
Summary: Medical record request Disability Determin  Medical record request Disability Determin   Imported By: Cammie Sickle 10/27/2010 13:58:45  _____________________________________________________________________  External Attachment:    Type:   Image     Comment:   External Document

## 2010-11-16 LAB — CBC
HCT: 29.2 % — ABNORMAL LOW (ref 36.0–46.0)
MCV: 83.4 fL (ref 78.0–100.0)
Platelets: 453 10*3/uL — ABNORMAL HIGH (ref 150–400)
RDW: 14 % (ref 11.5–15.5)
WBC: 10.2 10*3/uL (ref 4.0–10.5)

## 2010-11-16 LAB — SEDIMENTATION RATE: Sed Rate: 26 mm/hr — ABNORMAL HIGH (ref 0–22)

## 2010-11-17 LAB — CBC
HCT: 29.2 % — ABNORMAL LOW (ref 36.0–46.0)
HCT: 29.3 % — ABNORMAL LOW (ref 36.0–46.0)
HCT: 34.3 % — ABNORMAL LOW (ref 36.0–46.0)
MCHC: 34.2 g/dL (ref 30.0–36.0)
MCHC: 34.4 g/dL (ref 30.0–36.0)
MCV: 83.8 fL (ref 78.0–100.0)
MCV: 84 fL (ref 78.0–100.0)
MCV: 84.3 fL (ref 78.0–100.0)
Platelets: 232 10*3/uL (ref 150–400)
RBC: 3.48 MIL/uL — ABNORMAL LOW (ref 3.87–5.11)
RBC: 3.49 MIL/uL — ABNORMAL LOW (ref 3.87–5.11)
RDW: 14.3 % (ref 11.5–15.5)
WBC: 10.7 10*3/uL — ABNORMAL HIGH (ref 4.0–10.5)
WBC: 11.9 10*3/uL — ABNORMAL HIGH (ref 4.0–10.5)
WBC: 14 10*3/uL — ABNORMAL HIGH (ref 4.0–10.5)

## 2010-11-17 LAB — BASIC METABOLIC PANEL
BUN: 8 mg/dL (ref 6–23)
CO2: 33 mEq/L — ABNORMAL HIGH (ref 19–32)
Chloride: 104 mEq/L (ref 96–112)
Chloride: 104 mEq/L (ref 96–112)
Creatinine, Ser: 0.76 mg/dL (ref 0.4–1.2)
GFR calc Af Amer: >60 mL/min (ref >60)
GFR calc non Af Amer: >60 mL/min (ref >60)
Glucose, Bld: 172 mg/dL — ABNORMAL HIGH (ref 70–99)
Potassium: 3.5 mEq/L (ref 3.5–5.1)
Potassium: 3.6 mEq/L (ref 3.5–5.1)

## 2010-11-17 LAB — PROTIME-INR
INR: 1.1 (ref 0.00–1.49)
Prothrombin Time: 15 seconds (ref 11.6–15.2)

## 2010-11-17 LAB — DIFFERENTIAL
Basophils Absolute: 0 10*3/uL (ref 0.0–0.1)
Basophils Relative: 0 % (ref 0–1)
Eosinophils Absolute: 0 10*3/uL (ref 0.0–0.7)
Eosinophils Absolute: 0.2 10*3/uL (ref 0.0–0.7)
Eosinophils Relative: 0 % (ref 0–5)
Eosinophils Relative: 2 % (ref 0–5)
Lymphocytes Relative: 20 % (ref 12–46)
Lymphs Abs: 1.7 10*3/uL (ref 0.7–4.0)
Lymphs Abs: 2.3 10*3/uL (ref 0.7–4.0)
Monocytes Relative: 8 % (ref 3–12)
Neutrophils Relative %: 70 % (ref 43–77)
Neutrophils Relative %: 80 % — ABNORMAL HIGH (ref 43–77)

## 2010-11-17 LAB — SEDIMENTATION RATE: Sed Rate: 40 mm/hr — ABNORMAL HIGH (ref 0–22)

## 2010-11-17 LAB — LIPID PANEL
Triglycerides: 35 mg/dL (ref <150)
VLDL: 7 mg/dL (ref 0–40)

## 2010-11-20 ENCOUNTER — Inpatient Hospital Stay (INDEPENDENT_AMBULATORY_CARE_PROVIDER_SITE_OTHER)
Admission: RE | Admit: 2010-11-20 | Discharge: 2010-11-20 | Disposition: A | Discharge status: Home / Self Care | Payer: Managed Care, Other (non HMO) | Source: Ambulatory Visit | Attending: Emergency Medicine | Admitting: Emergency Medicine

## 2010-11-20 DIAGNOSIS — R6889 Other general symptoms and signs: Secondary | ICD-10-CM

## 2010-11-20 DIAGNOSIS — K089 Disorder of teeth and supporting structures, unspecified: Secondary | ICD-10-CM

## 2010-11-20 DIAGNOSIS — R109 Unspecified abdominal pain: Secondary | ICD-10-CM

## 2010-11-20 LAB — POCT URINALYSIS DIP (DEVICE)
Glucose, UA: NEGATIVE mg/dL
Hgb urine dipstick: NEGATIVE
Specific Gravity, Urine: >=1.03 (ref 1.005–1.030)
Urobilinogen, UA: 0.2 mg/dL (ref 0.0–1.0)

## 2010-12-24 NOTE — Op Note (Signed)
[name], [name]           ACCOUNT NO.:  [id number]   MEDICAL RECORD NO.:  [id number]          PATIENT TYPE:  INP   LOCATION:  9399                          FACILITY:  [facility]   PHYSICIAN:  Miguel Aschoff, M.D.       DATE OF BIRTH:  1975/12/08   DATE OF PROCEDURE:  09/08/2007  DATE OF DISCHARGE:                               OPERATIVE REPORT   PREOPERATIVE DIAGNOSIS:  Chronic pelvic pain, dysmenorrhea, pelvic  endometriosis.   POSTOPERATIVE DIAGNOSIS:  Chronic pelvic pain, dysmenorrhea, pelvic  endometriosis, with endometrioma of right ovary, pelvic, omental and  bowel adhesions.   SURGEON:  Miguel Aschoff, M.D.   ASSISTANT:  Carrington Clamp, M.D.   ANESTHESIA:  General.   COMPLICATIONS:  None.   JUSTIFICATION:  The patient is a 35 year old black female with a known  history of endometriosis now with severe dysmenorrhea and pelvic pain,  noted on ultrasound to have what appears to be an endometrioma involving  the right ovary. Because of the symptoms associated with her  endometriosis, she now desires a definitive procedure to be carried out  to eliminate the pelvic pain and definitively treat the endometriosis.  She presents now to undergo total abdominal hysterectomy and right  salpingo-oophorectomy.  She understands that this will necessitate her  being on hormone replacement therapy.  The risks and benefits of the  procedure were discussed.  Informed consent has been obtained.   PROCEDURE:  The patient was taken to the operating room and placed in  the supine position and general anesthesia was administered without  difficulty.  She was then prepped and draped in the usual sterile  fashion and a Foley catheter was inserted.  The patient was noted have  both Pfannenstiel and previous midline vertical incision.  The midline  incision was then reopened and the prior vertical scar was excised.  The  incision was then continued through the subcutaneous tissue until the  fascia  was found.  At this point, the fascia was then opened vertically  and separated from the underlying rectus muscles.  The rectus muscles  were divided in the midline, the peritoneum was then found and entered  carefully avoiding underlying structures.  There were omental adhesions  noted on the anterior peritoneal surfaces.  These were taken down with  sharp dissection without difficulty.  Additionally, a loop of small  bowel was adherent to the anterior abdominal wall with extreme care as  possible to free the bowel off the anterior abdominal wall and  inspection revealed no injury to the bowel during this lysis of  adhesions.  After this was done, a self-retaining retractor was placed  through the wound.  The viscera were packed out of the pelvis.  Inspection showed the uterus be anterior, top normal size and shape.  There appeared to be very small mm size fibroids noted on the serosal  surface in addition to her multiple implants of endometriosis noted on  the anterior peritoneum and the cul-de-sac.  The right ovary was  inspected and was noted to have an endometrioma approximately 4 cm in  size present with adhesions holding  the ovary to the lateral pelvic  sidewall.  The left tube and ovary were absent consistent with the  patient's history of prior surgery.  At this point, the uterus was  elevated.  The round ligaments were identified, suture ligated, and then  divided and anterior bladder flap was developed without difficulty.  A  perforation was made below the infundibulopelvic ligament.  The ureter  was inspected and with the ureter being out of the field, the  infundibulopelvic ligament clamped, cut and doubly ligated using two  ligatures of 0 Vicryl. On the left side, the round ligament was ligated  and cut. The broad ligament was skeletonized.  Now, the uterine vessels  were identified, clamped with curved Heaney clamps.  These pedicles were  cut and suture ligated using suture  ligatures of 0 Vicryl.  Straight  Heaney clamps were then used to clamp the paracervical fascia. Each  pedicle was cut and suture ligated using suture ligatures of 0 Vicryl.  Following this, the uterosacral ligaments were clamped, cut, and suture  ligated in a similar fashion.  At this point, this dissection was now  low enough where curved Heaney clamps were placed across the reflection  of the vagina onto the cervix and these pedicles were cut and the  specimen was freed. The specimen consisted of the cervix, uterus, right  tube and ovary, and this was sent for histologic study.  At this point,  the angles of the vaginal cuff were ligated using Heaney sutures of 0  Vicryl incorporating the uterosacral ligaments for support.  These  pedicles were held and the cuff was closed using running interlocking 0  Vicryl suture.  Inspection was made for hemostasis.  Hemostasis appeared  to be adequate. At this point, the bladder peritoneum was  reapproximated.  The pelvis was irrigated with warm saline.  There  appeared to be excellent hemostasis and, at this point, lap counts and  instrument counts were taken and found to be correct and the abdomen was  closed.  The parietoperitoneum was closed using running continuous 0  Vicryl suture, the fascia was closed using interrupted sutures of 0  Vicryl, the subcutaneous tissues were closed using interrupted 0 Vicryl  sutures and the skin incision was closed using staples.  The estimated  blood loss was approximately 150 mL.  The patient tolerated the  procedure well and went to the recovery room in satisfactory condition.  September 07, 2007      Miguel Aschoff, M.D.  Electronically Signed     AR/MEDQ  D:  09/08/2007  T:  09/08/2007  Job:  [id number]

## 2010-12-24 NOTE — Discharge Summary (Signed)
NAMEELENIE, [name]            ACCOUNT NO.:  [id number]   MEDICAL RECORD NO.:  [id number]          PATIENT TYPE:  INP   LOCATION:  A304                          FACILITY:  [facility]   PHYSICIAN:  Vickki Hearing, M.D.DATE OF BIRTH:  10/19/1975   DATE OF ADMISSION:  02/19/2009  DATE OF DISCHARGE:  07/17/2010LH                               DISCHARGE SUMMARY   ADMITTING DIAGNOSIS:  Closed left tibia and fibular fracture.   DISCHARGE DIAGNOSIS:  Closed left tibia and fibular fracture.   ADMITTING PHYSICIAN:  Vickki Hearing, MD   DISCHARGING PHYSICIAN:  Vickki Hearing, MD   CONSULTING PHYSICIAN:  Gerrit Friends. Dietrich Pates, MD, [facility], cardiologist.   HISTORY:  The patient was injured on February 19, 2009, secondary to a fall  down her grandmother stairs.  She presented to the emergency room with a  deformed left lower extremity, at that time it was noted that she had  protrusio acetabuli.  She was admitted to with a splint and seen the  next morning where further history revealed that she had marfanoid  features.  She had been evaluated at [facility] when she was 15 until she  did not have Marfan syndrome, although she had a heart murmur, poor  vision, and scoliosis as well as long fingers and expanded wings band  versus height.  After Cardiology consult confirmed that she was stable  for surgery.  We took her to surgery on Wednesday, February 21, 2009, where  she underwent uncomplicated intramedullary lock nailing of left tibia  with closed reduction with a Katrinka Blazing and nephew tibial nail lock proximal  and distal.   Postoperatively, she had significant limitations in her mobility  somewhat secondary to the protrusio acetabuli with giving way symptoms  and pain in the right hip.   However, on Friday, February 23, 2009, she improved, however, she could  ambulate bed to chair and bed to bathroom and she wanted to go home on  February 24, 2009.  Rehab was offered as an alternative, but according to  the Social Service Department this was declined by insurance.   At discharge, vital signs are stable, blood pressures 134/75, room air  sat is 97%, her pain level this morning was at 3 on Percocet.   She will be discharged on the following medications:  1. Motrin 800 mg q.8 h.  2. Percocet 1 tablet q.4 h. p.r.n. pain.  3. Robaxin 500 mg q.8 p.r.n. muscle spasms.  4. Phenergan 25 mg q.4 p.r.n. nausea.   For followup appointment will be scheduled for 26 July, 2010.   At that time, we will do x-rays of the tibia, change the wound check,  change the dressing, change the splint, possibly apply a walking brace,  versus continue posterior splinting.      Vickki Hearing, M.D.  Electronically Signed     SEH/MEDQ  D:  02/24/2009  T:  02/24/2009  Job:  [id number]

## 2010-12-24 NOTE — H&P (Signed)
[name], [name]            ACCOUNT NO.:  [id number]   MEDICAL RECORD NO.:  [id number]          PATIENT TYPE:  INP   LOCATION:  IC01                          FACILITY:  [facility]   PHYSICIAN:  Vickki Hearing, M.D.DATE OF BIRTH:  January 15, 1976   DATE OF ADMISSION:  02/19/2009  DATE OF DISCHARGE:  [facility]                              HISTORY & PHYSICAL   CHIEF COMPLAINT:  Pain left tibia.   HISTORY:  She is a 35 year old female history of acetabular protrusio  with a workup in the past for Marfan Syndrome which proved negative but  she has many marfanoid features including a heart murmur, scoliosis and  large wing span.  She fell down the steps of her grandmother's house  with no loss of consciousness.  She presented with a deformed left lower  leg consistent with the left tibia fracture, midshaft, which was found  on x-ray.  She was admitted to the hospital, after being placed in a  posterior splint.  She was neurovascular intact.   She has had previous surgeries including hysterectomy.  She has no  primary care physician.  She is seen at a clinic in [place].  She has  a history of migraine headaches.  She has a history of being a  nonsmoker, nondrinker.  No drug abuse.  Lives at home with her spouse.  Contraception birth control pills. Special needs were none.   ALLERGIES:  None known.   REVIEW OF SYSTEMS:  Negative.   She was actually seen yesterday, dictation done today.  VITAL SIGNS:  She was stable in terms of her vitals.  OVERALL APPEARANCE:  Showed that of marfanoid features including large  wing span.  She is 6 feet tall.  She has long slender fingers.  She has  a heart murmur.  SKIN:  Warm, dry and intact.  No rash or lesion.  LYMPH NODES:  Negative.  CARDIOVASCULAR:  Shows good pulse and perfusion on all limbs.  Gait not determine, the patient cannot ambulate.  In terms of range of  motion, strength and stability pressure extremity, that was normal with  no  deformity.  Right lower extremity:  Long limbs is noted.  No  deformities noted there.  Range of motion in the right hip is decreased  because of acetabular protrusio.  Muscle tone is normal.  Strength  normal.  Left leg is in external rotation from the tibia fracture.  Neurovascular exam is intact.  Muscle tone is normal.   X-rays show a tibia fracture left leg, mid shaft, with rotational  malalignment.  Length is normal.  There is medial lateral shift which is  actually external rotation on the [facility] view.   The femoral heads are protruding into the acetabulum on both views.  The  pelvis is somewhat champagne glass shaped  She has some scoliosis in the lumbar region.   Her chest x-ray shows normal with no active disease.   Cardiac consult was been obtained with Dr. Dietrich Pates to evaluate the  heart murmur.  The patient is scheduled to have left tibial nailing   Risks and benefits of procedure  and plan have been explained including  alternative treatment of long-leg cast.  The patient has opted for the  tibial nailing.      Vickki Hearing, M.D.  Electronically Signed     SEH/MEDQ  D:  02/21/2009  T:  02/21/2009  Job:  [id number]

## 2010-12-24 NOTE — Group Therapy Note (Signed)
NAMECAMPBELL, AGRAMONTE            ACCOUNT NO.:  [id number]   MEDICAL RECORD NO.:  [id number]          PATIENT TYPE:  INP   LOCATION:  A304                          FACILITY:  [facility]   PHYSICIAN:  Vickki Hearing, M.D.DATE OF BIRTH:  12/21/1975   DATE OF PROCEDURE:  DATE OF DISCHARGE:                                 PROGRESS NOTE   On postop day #2 status post left tibial nailing.  The patient continues  to have some pain, although it seems like she is using the PCA more than  the oral pain medications.  I would like to change that and get her on  oral agents.  She is having some itching as well as some muscle spasms  which are treated with medications as ordered.   Continue to try mobilization, get the patient on p.o. pain medicines and  consider discharge in the morning.      Vickki Hearing, M.D.  Electronically Signed     SEH/MEDQ  D:  02/23/2009  T:  02/23/2009  Job:  [id number]

## 2010-12-24 NOTE — Group Therapy Note (Signed)
[name], [name]            ACCOUNT NO.:  [id number]   MEDICAL RECORD NO.:  [id number]          PATIENT TYPE:  INP   LOCATION:  A304                          FACILITY:  [facility]   PHYSICIAN:  Vickki Hearing, M.D.DATE OF BIRTH:  11-Jun-1976   DATE OF PROCEDURE:  02/22/2009  DATE OF DISCHARGE:                                 PROGRESS NOTE   The patient was evaluated on postop day #1, was doing well, was  neurovascularly intact with soft compartments.  She was complaining of  mild-to-moderate pain in her left lower extremity.  She was complaining  of some back pain as well.  She is to mobilize today, get out of bed,  work with therapy, and continue present management.      Vickki Hearing, M.D.  Electronically Signed     SEH/MEDQ  D:  02/23/2009  T:  02/23/2009  Job:  [id number]

## 2010-12-24 NOTE — Op Note (Signed)
NAMELIZZIE, AN            ACCOUNT NO.:  [id number]   MEDICAL RECORD NO.:  [id number]          PATIENT TYPE:  INP   LOCATION:  A304                          FACILITY:  [facility]   PHYSICIAN:  Vickki Hearing, M.D.DATE OF BIRTH:  Feb 24, 1976   DATE OF PROCEDURE:  02/20/2009  DATE OF DISCHARGE:  02/24/2009                               OPERATIVE REPORT   HISTORY OF PRESENT ILLNESS:  A 35 year old female with marfanoid  features fell at her grandmother's house on the twelfth, was admitted to  the hospital.  She had a deformity of the left lower extremity, which  was consistent with a left tibia fracture and fibular fracture.  She was  admitted to the hospital after being placed in a posterior splint.  She  was neurovascularly intact.   She had a preoperative cardiology consult secondary to the marfanoid  features and was cleared.   PREOPERATIVE DIAGNOSIS:  Closed left tibia fracture.   POSTOPERATIVE DIAGNOSIS:  Closed left tibia fracture.   PROCEDURE:  Closed treatment internal fixation, left tibia with Katrinka Blazing  and Nephew tibial nail locked proximal and distal.   SURGEON:  Vickki Hearing, MD   ASSISTANT:  Kathalene Frames   Site marking was done in the preop area.  The patient had a general  anesthetic.  She had minimal blood loss.  The tourniquet was used for 47  minutes.   OPERATIVE FINDINGS:  The proximal fibular fracture was comminuted.  The  distal third mid shaft tibia fracture was spiral oblique.   In the operating room after anesthetic, the patient had antibiotics  given.  She had a Foley catheter inserted.  Her left leg was prepped and  draped sterilely.   The tourniquet was elevated to 300 mmHg.  The knee was flexed to 90  degrees.  A straight incision was made over the medial patellar tendon  divided down through subcutaneous tissue.  The tendon was elevated from  the subtendinous fat pad and the flat spot on the tibia was identified  and then a curved awl  was used to enter the tibial canal.  Its position  was adjusted using the C-arm.  Guidewire was placed into the proximal  tibia and passed across the fracture site under C-arm guidance with the  leg manually reduced.  Radiographs confirmed reduction and passing of  the guidewire.  Serial reaming in increments of 0.5 cm were performed up  to a size 11.  The nail was measured to be a 41 using subtraction  technique.  The tourniquet was released, the nail was passed.   The proximal screw was placed through the guide through a separate  incision.   The leg was then extended.  Two distal screws were placed using a  freehand technique through a separate incision from medial to lateral.  Radiographs confirmed placement of all screws and reduction of fracture.   Wounds were irrigated and closed in layered fashion with 0 Monocryl, 2-0  Monocryl, proximal staples, and distal staples.   A posterior splint was applied with the foot in neutral position.   The patient was extubated and  taken to recovery room in stable  condition.      Vickki Hearing, M.D.  Electronically Signed     SEH/MEDQ  D:  02/26/2009  T:  02/27/2009  Job:  [id number]

## 2010-12-24 NOTE — Consult Note (Signed)
NAMEANGELICE, PIECH            ACCOUNT NO.:  [id number]   MEDICAL RECORD NO.:  [id number]          PATIENT TYPE:  INP   LOCATION:  A304                          FACILITY:  [facility]   PHYSICIAN:  Gerrit Friends. Dietrich Pates, MD, FACCDATE OF BIRTH:  Sep 09, 1975   DATE OF CONSULTATION:  02/21/2009  DATE OF DISCHARGE:                                 CONSULTATION   REFERRING PHYSICIAN:  Vickki Hearing, MD   PRIMARY CARE PHYSICIAN:  [facility].   HISTORY OF PRESENT ILLNESS:  A 35 year old woman admitted to hospital  after a fall resulting in a left tibial and fibular fracture referred  for preoperative assessment due to possible Marfan syndrome.  This nice  woman has a number of skeletal abnormalities including acetabular  protrusio and scoliosis.  She does not carry the diagnosis of Marfan.  She has been told of a heart murmur in the past and had an  echocardiogram some years ago, but is unaware of the results.  Other  than that test, she has not been seen or followed by a cardiologist.  She had normal development and exercise capacity as a child and young  adult.   PAST MEDICAL HISTORY:  Includes a TAH-BSO in 2009.  She has a history of  migraine headaches and GERD.   ALLERGIES:  She reports no medical allergies.   MEDICATIONS:  She was taking no medications routinely at the time of  admission.   FAMILY HISTORY:  No aortic aneurysms or episodes of sudden death in  family members; no relatives with Marfan body habitus by her  description.   SOCIAL HISTORY:  She is married and lives locally.  She does not use  alcohol nor tobacco products.   REVIEW OF SYSTEMS:  Notable for a regular diet, stable weight and  appetite, and mild insomnia.  All other systems reviewed and are  negative.   PHYSICAL EXAMINATION:  GENERAL:  Pleasant, thin, tall woman in  significant pain with any motion of her left leg.  Her height is 70  inches, weight 73 kg, arm span 80 inches.  HEENT:   No ocular abnormality; normal oral mucosa.  NECK:  No jugular venous distention; no carotid bruits.  ENDOCRINE:  No thyromegaly.  HEMATOPOIETIC:  No adenopathy.  SKIN:  No significant lesions.  CARDIAC:  Normal first and second heart sounds; grade 2/6 systolic  ejection murmur at the cardiac base.  LUNGS:  Clear.  ABDOMEN:  Soft and nontender; no organomegaly.  EXTREMITIES:  Long fingers with significant overlap of the thumbnail and  first and second fingers.  No edema.  NEUROLOGIC:  Symmetric strength and tone; normal cranial nerves.  MUSCULOSKELETAL:  Left leg and a soft cast.   EKG:  Sinus bradycardia; slightly delayed R-wave progression; prominent  voltage; nondiagnostic inferior Q-waves; otherwise unremarkable.  No  previous tracing for comparison.  Laboratory otherwise notable for mild  anemia with a hemoglobin of 11.8 and a normal MCV.  Coagulation studies  are normal.  Chemistry profile notable for borderline hypokalemia with  potassium of 3.5 and a random glucose of 172.  Lipid profile  is  excellent.   IMPRESSION:  Ms. Minna Merritts does have skeletal findings consistent with  Marfan syndrome, but apparently has had not enough supportive evidence  in the past to satisfy the diagnostic criteria.  I assume her  echocardiogram was negative at age 35.  We will repeat that study.  There is no reason to think that she has any cardiovascular abnormality  that would preclude her pending urgent surgery.  No preoperative testing  nor medical therapy is required.  Her cardiovascular risk is no greater  than any other generally healthy 35 year old woman.  If her aorta is  normal on the echocardiogram, further evaluation is probably not  warranted at the present time.  I would recommend periodic evaluations  of her aorta, perhaps every 5-10 years.      Gerrit Friends. Dietrich Pates, MD, [facility]  Electronically Signed     RMR/MEDQ  D:  02/21/2009  T:  02/22/2009  Job:  [id number]

## 2010-12-27 NOTE — H&P (Signed)
[name], [name]           ACCOUNT NO.:  [id number]   MEDICAL RECORD NO.:  [id number]          PATIENT TYPE:  INP   LOCATION:  NA                            FAC[facility]:  [facility]   PHYSICIAN:  [name], M.D. DATE OF BIRTH:  1975-12-30   DATE OF ADMISSION:  DATE OF DISCHARGE:                                HISTORY & PHYSICAL   This is a 35 year old gravida 2, para 1, 0-0-1 at 62 weeks who presents for  elective primary C-section for protrusis atibular.  This is her first C-  section.  Pregnancy has been followed by Dr. Estanislado [name] and remarkable for:   1.  History of heart murmur.  2.  History of protrusis atibular.  3.  History of migraines.  4.  History of GERD.   PRENATAL LABORATORIES:  Hemoglobin 10.9, platelets 242,000, blood type B+,  antibody screen negative, sickle cell negative, RPR nonreactive, Rubella  immune, hepatitis negative, HIV negative, Pap test normal, gonorrhea  negative, Chlamydia negative, cystic fibrosis negative.   OBSTETRICAL HISTORY:  Remarkable for a vaginal delivery in 2003 of a female  infant at [redacted] weeks gestation weighing 6 pounds with no complications.   MEDICAL HISTORY:  1.  Endometriosis.  2.  History of abnormal Pap in 2002.  3.  History of childhood varicella.  4.  History of GERD in the past.  5.  History of migraines.  6.  History of protrusis atibular, which causes her to be unable to abduct      her legs.   FAMILY HISTORY:  1.  Grandmother, mother, and aunt with hypertension.  2.  Aunt with varicose veins.  3.  Grandfather with emphysema.  4.  Grandmother and aunt with diabetes.   GENETIC HISTORY:  Remarkable for the patient with a heart murmur and  grandmother and paternal grandmother who had twins.   SOCIAL HISTORY:  The patient is married to [name], who is involved and  supportive.  She does not report a religious affiliation.  She denies any  alcohol, tobacco, or drug use.   HISTORY OF CURRENT PREGNANCY:  The patient  entered care at 13 weeks.  She  was treated for a urinary tract infection at that time.  She had a quad  screen, which was normal.  She had an ultrasound at 19 weeks, which was  normal except for seeing a fibroid on her uterus which was 2 cm.  Otherwise,  the baby looked normal.  She had pelvic pain throughout her pregnancy and  had to decrease work hours because of that.  She had a 1-hour Glucola, which  was normal at 26 weeks.  She had an upper respiratory infection at 32 weeks  and some low back pain and abdominal tightening at 33 weeks, which was  evaluated and found to not be related to preterm labor, and she had a  negative fetal fibronectin at that time.   OBJECTIVE DATA:  VITAL SIGNS:  Vital signs stable, afebrile.  HEENT:  Within normal limits.  NECK:  Thyroid normal.  CHEST:  Clear to auscultation.  HEART:  Regular rate and rhythm.  ABDOMEN:  Gravid at 38 cm.  Vertex by Leopold's.  CFM shows a fetal heart  rate of 150.  PELVIC EXAM:  Deferred.  EXTREMITIES:  Within normal limits except for the patient stating she is  unable to abduct her legs.   ASSESSMENT:  1.  Intrauterine pregnancy at 38-0/7 weeks.  2.  Elective primary low transverse cesarean section for protrusis atibular.   PLAN:  Admit to OR per Dr. Estanislado [name] and further orders to follow.      [name], C.N.M.      [name], M.D.  Electronically Signed    MLW/MEDQ  D:  08/29/2005  T:  08/29/2005  Job:  [id number]

## 2010-12-27 NOTE — Discharge Summary (Signed)
NAMEDIEGO, DELANCEY           ACCOUNT NO.:  [id number]   MEDICAL RECORD NO.:  [id number]          PATIENT TYPE:  INP   LOCATION:  9107                          FACILITY:  [facility]   PHYSICIAN:  Osborn Coho, M.D.   DATE OF BIRTH:  03/02/1976   DATE OF ADMISSION:  08/29/2005  DATE OF DISCHARGE:                                 DISCHARGE SUMMARY   ADMITTING DIAGNOSES:  1.  Intrauterine pregnancy at 38 weeks.  2.  Protrusio acetabuli.  3.  Desire for sterilization   PROCEDURE:  1.  Primary low transverse cesarean section.  2.  Right tubal ligation.   POSTOPERATIVE DIAGNOSES:  1.  Intrauterine pregnancy at 38 weeks, delivered.  2.  Protrusio acetabuli.  3.  Primary low transverse cesarean section.  4.  Right tubal ligation.  5.  Pelvic adhesions.  6.  Status post left salpingo-oophorectomy.   Ms. Mesmer is a 35 year old gravida 2 para 1-0-0-1 who presents at [redacted]  weeks gestation for elective primary C-section for protrusio acetabuli. This  was accomplished on August 29, 2005, with Dr. Dois Davenport Rivard as surgeon. The  patient gave birth to a 6-pound 5-ounce female infant named Madison with  Apgar scores of 7 at one minute and 10 at five minutes. The patient's  postoperative course has been complicated with her mobility issues due to  her protrusio acetabuli. She has had PT/OT consultation, orthopedic  consultation. These notes are dictated and on her chart. Her postoperative  course has been normal for her C-section. Her incision is clean, dry and  intact and healing well. She did develop an elevated temperature on  postoperative day #3 which peaked at 100.9. She has now been afebrile for 24  hours. Her CBC shows a white count of 25.4; hemoglobin 8.4; hematocrit 24.7;  and platelets 264,000. This has been stable on January 22 and January 23.  The patient has had plan in place from the social work department and is to  go home with [facility]. Arrangements have been  made and the patient  is aware of these arrangements. Therefore, on this her fourth postoperative  day she is judged to be in satisfactory condition for discharge.   DISCHARGE INSTRUCTIONS:  Per [facility] handout. She has discharge  instructions and follow-up from her orthopedic doctor, OT and PT, and a plan  for home care through social work. She will follow up at the office of [facility]  in 6 weeks.      Rica Koyanagi, C.N.M.      Osborn Coho, M.D.  Electronically Signed    SDM/MEDQ  D:  09/02/2005  T:  09/02/2005  Job:  [id number]

## 2010-12-27 NOTE — H&P (Signed)
[facility]  Patient:    [name], [name] Visit Number: [id number] MRN: [id number]          Service Type: OBS Location: 910B [id number] [id number] Attending Physician:  Maxie Better Dictated by:   Genia Del, M.D. Admit Date:  01/05/2002                           History and Physical  DATE OF BIRTH:  March 11, 1976  PATIENT PROFILE:              The patient is a 35 year old G1, expected date of delivery January 11, 2002, at 39 weeks 1 day gestation.  REASON FOR ADMISSION:         Spontaneous rupture of membranes with clear fluid and uterine contractions increasing every five minutes since 2 a.m. on Jan 05, 2002.  HISTORY OF PRESENT ILLNESS:   Fetal movements positive.  No vaginal bleeding. No PIH symptoms.  The patient felt clear fluid leaking since 2 a.m. continuously in small amounts.  She also felt an increase in her uterine contractions which started at around 10 p.m. on May 27; now since 2 a.m. they were every five minutes and more intense.  PAST MEDICAL HISTORY:         Positive for a bone disorder causing decreased flexibility at the hip.  PAST SURGICAL HISTORY:        Left ovariectomy at around 35 years old and then diagnostic laparoscopy.  ALLERGIES:                    No known drug allergies.  MEDICATIONS:                  Prenate vitamins.  SOCIAL HISTORY:               Married, nonsmoker.  FAMILY HISTORY:               Cardiovascular disease, thyroid dysfunction, diabetes mellitus and lupus.  HISTORY OF PRESENT PREGNANCY:                    First trimester was normal.  Labs showed hemoglobin at 11.7, platelets 257.  Blood type Rh B positive, Rh antibody is negative.  Sickle cell trait negative.  Thalassemia negative.  RPR nonreactive.  HBsAg negative.  HIV negative.  Rubella immune.  Gonorrhea and Chlamydia negative.  Pap test within normal limits.  First visit was late at 14 weeks.  Second trimester was within normal  limits.  Triple test was within normal limits at 15+ weeks.  At 16 weeks the patient was treated with Amoxil 500 mg t.i.d. for seven days for probable cystitis.  Her ultrasound review of anatomy was within normal limits at 20 weeks, placenta was normal, no anomaly seen.  The patient went for an orthopedic consult because of her bone disorder and decreased flexibility associated with difficulty walking with severe lumbar pain.  She improved over the course of her pregnancy.  In the third trimester an ultrasound showed a normal placenta, amniotic fluid was at the 96th percentile, estimated fetal weight at the 80th percentile.  One-hour GTT was within normal limits at 28 weeks 109.  Group B strep at 36 weeks was negative.  The patient had recurrent Candida vaginitis which were treated with vaginal antifungal agents.  Blood pressures remained normal throughout pregnancy.  Weight gain was adequate.  REVIEW OF SYSTEMS:  Constitutional: Negative.  HEENT: Negative. Cardiovascular/respiratory: Negative.  Urologic/GI: Negative.  Neurologic, dermatologic, endocrine: Negative.  PHYSICAL EXAMINATION:  VITAL SIGNS:                  Blood pressures 132-141/73-77, afebrile, regular pulse, respiratory rate 20.  LUNGS:                        Clear bilaterally.  HEART:                        Regular cardiac rhythm, no murmur.  ABDOMEN:                      Gravid, cephalic presentation.  PELVIC:                       Vaginal exam on admission 4-5 cm, 70%, vertex -2.  Uncertain about rupture of membranes per nurse.  EXTREMITIES:                  Lower limbs normal, no edema.  MONITORING:                   Baseline fetal heart rate at 110 to 120 with long-term variability, no deceleration.  Uterine contractions every 3-5 minutes, moderate in intensity.  IMPRESSION: 1. Gravida 1, at 39+ weeks gestation. 2. Group B Streptococcus negative. 3. Probable spontaneous rupture of membranes and  in labor.  Fetal well-being    reassuring in spite of baseline around 115 per minute.  PLAN:                         Admit to labor and delivery, follow with continuous monitoring, expectant management with probable vaginal delivery. Dictated by:   Genia Del, M.D. Attending Physician:  Maxie Better DD:  01/05/02 TD:  01/05/02 Job: [id number]

## 2010-12-27 NOTE — Discharge Summary (Signed)
[name], [name]           ACCOUNT NO.:  [id number]   MEDICAL RECORD NO.:  [id number]          PATIENT TYPE:  INP   LOCATION:  9312                          FACILITY:  [facility]   PHYSICIAN:  Miguel Aschoff, M.D.       DATE OF BIRTH:  [birth year]   DATE OF ADMISSION:  09/08/2007  DATE OF DISCHARGE:  09/11/2007                               DISCHARGE SUMMARY   ADMISSION DIAGNOSIS:  Chronic pelvic pain   FINAL DIAGNOSIS:  Pelvic endometriosis, leiomyomata uteri, pelvic  adhesions.   OPERATIONS AND PROCEDURES:  Total abdominal hysterectomy and right  salpingo-oophorectomy general anesthesia.   BRIEF HISTORY:  The patient is a 35 year old black female with long  history of persistent pelvic pain and a history of endometriosis.  The  patient underwent a prior left salpingo-oophorectomy because of  endometriosis and endometrioma.  She then developed recurrent severe  pelvic pain and dysmenorrhea, dyspareunia and because of the  symptomatology requested that a definitive procedure be carried out in  an effort to control her pain.  She is referred abdominal hysterectomy  and oophorectomy as a definitive procedure for the endometriosis and  informed consent was obtained and the patient was admitted for this  procedure.   HOSPITAL COURSE:  Preoperative studies were obtained.  This included  admission hemoglobin of 10.8, hematocrit 32.3, white count of 7400.  Chemistry profile was essentially within normal limits.  PT and PTT were  within normal limits. Blood type was noted be B+.  On September 08, 2007  under general anesthesia total abdominal hysterectomy and right salpingo-  oophorectomy were carried out without difficulty.  The findings at time  of surgery revealed endometriosis with an endometrioma involving the  right ovary as well as pelvic omental and bowel adhesions.  Surgical  procedures were carried out without difficulty and the patient had an  essentially uneventful postoperative  course.  The patient does have a  history of significant hip problems and is a little slow to ambulate.  However, by September 11, 2007  her hemoglobin was noted be stable at  10.1.  She was tolerating regular diet and at this point able to  ambulate and was in satisfactory condition to be discharged home.  Medications for home included Tylox 1 every 3 hours as needed for pain,  slow- __________ iron tablets one daily.  She was instructed to the  laxative of choice to avoid constipation secondary to the narcotic  medications.  She is instructed to do no heavy lifting, place nothing in  the vagina and call for follow-up visit in 4 weeks.  The final pathology  report showed uterus weighing 114 grams. There was chronic cervicitis,  endometrium was unremarkable.  There was subserosal leiomyomata noted.  There were fibrous adhesions and focal hemosiderin deposits on the  uterine serosa. Right ovary had an endometriotic cyst but no evidence of  malignancy. The right tube showed tubo-ovarian adhesions.  The patient  was sent home in satisfactory condition on a regular diet.      Miguel Aschoff, M.D.  Electronically Signed     AR/MEDQ  D:  10/20/2007  T:  10/21/2007  Job:  [id number]

## 2010-12-27 NOTE — Op Note (Signed)
[name], [name]           ACCOUNT NO.:  [id number]   MEDICAL RECORD NO.:  [id number]          PATIENT TYPE:  INP   LOCATION:  9107                          FACILITY:  [facility]   PHYSICIAN:  Crist Fat. Rivard, M.D. DATE OF BIRTH:  1976/05/18   DATE OF PROCEDURE:  08/29/2005  DATE OF DISCHARGE:                                 OPERATIVE REPORT   PREOPERATIVE DIAGNOSIS:  Intrauterine pregnancy at 18 weeks,protrusis  atibular, desire for sterilization.   POSTOPERATIVE DIAGNOSIS:  Intrauterine pregnancy at 38 weeks,protrusis  atibular, desire for sterilization. Pelvic adhesions, status post left  salpingo-oophorectomy.   PROCEDURE:  Primary low transverse cesarean section, right tubal ligation.   ANESTHESIA:  Spinal, Dr. Harvest Forest.   SURGEON:  Crist Fat. Rivard, M.D.   ASSISTANT:  Adonis Huguenin, CNM.   ESTIMATED BLOOD LOSS:  900 mL.   DESCRIPTION OF PROCEDURE:  After being informed of the planned procedure  with possible complications including bleeding, infection, injury to bowel,  bladder or ureters as well as irreversibility of sterilization and failure  rate of 1/500, informed consent was obtained. The patient is taken to  cesarean suite, given spinal anesthesia without complication. She is placed  in the dorsal decubitus position, pelvis tilted to the left, prepped and  draped in a sterile fashion and a Foley catheter is inserted in her bladder.  After assessing adequate level of anesthesia, we infiltrate the suprapubic  area with 20 mL of Marcaine 0.25 and perform a Pfannenstiel incision which  was brought down sharply to the fascia. The fascia is incised in a low  transverse fashion. Linea alba is dissected. Peritoneum is entered in a  midline fashion. Visceral peritoneum is entered in a low transverse fashion  allowing [place] to safely retract bladder by developing a bladder flap. During  this procedure, note is made of the very acute angle of the pubic rami  making the pelvis  tight and deep. Myometrium is entered in a low transverse  fashion first with knife and then extended bluntly. Amniotic fluid is clear.  We assess the position of the baby who is transverse, head to maternal left  with a higher back presenting 1 foot and one arm through the hysterotomy.  The arm is moved back up, both feet are identified and the baby is extracted  through feet. A nuchal cord is reduced, second arm is delivered and the head  is delivered after correcting the hyperflexion with a hand inserted inside  the uterus. Mouth and nose are suctioned, cord is clamped with two Kelly  clamps and the baby is given to the pediatrician present in the room. 2  grams of Ancef IV is given to the patient and 20 mL of blood is drawn from  the umbilical vein. The placenta is allowed to deliver spontaneously. It is  complete, cord has three vessels and the uterine revision is negative.   The uterus is then closed in two layers, first with a running lock suture of  #0 Vicryl and then with a Lembert suture of #0 Vicryl imbricating the first  one. Hemostasis is completed in the right angle  with two figure-of-eight  stitches of #0 Vicryl. We then proceed with evaluation of the paracolic  gutters and confirm the absence of the left tube and left ovary patient  being status post left salpingo-oophorectomy. Confirm presence of the right  tube which was grasped with two Babcock forceps identified until the  fimbriae which was somewhat difficult due to adhesions in the ovarian fossa.  The mesosalpinx was entered with cautery and the tube is doubly ligated with  #0 chromic in the proximal and in the distal stump, a section of 1.5 cm is  then excised and both stumps are cauterized. Hemostasis is adequate. We put  our attention to the left side again to confirm there is no apparent tube.  This is confirmed but while investigating this area, we note bleeding coming  from the posterior aspect of the uterus. We  then decided to exteriorize the  uterus to evaluate this further and we then note multiple vascular adhesions  on the serosa of the posterior wall of the uterus and all of those are  oozing significantly. We then proceed to achieve complete hemostasis from  all these sites using both cauterization, figure-of-eight stitches of #0  Vicryl and figure-of-eight stitches of 3-0 Vicryl. One area is of concern  near the uterine vein where there is oozing. This area requires four figure-  of-eight stitches of 3-0 Vicryl as well as hemoclip placed on a small broad  ligament artery. Hemostasis is then deemed adequate. The back of the uterus  is irrigated with saline. Hemostasis is adequate and a full sheet of  Surgicel is applied on this area. The uterus is returned into the abdominal  cavity. The pelvis is irrigated with warm saline, hemostasis is completed on  the serosa and peritoneal edges with cautery. Hemostasis is then deemed  adequate. On the fascia, hemostasis is completed with cautery, fascia is  closed with two running sutures of #1 Vicryl meeting midline, incision is  irrigated with warm saline and skin is closed with staples.   Instruments and sponge count is complete x2. Estimated blood loss is 900 mL.  The procedure is well tolerated by the patient who is taken to the recovery  room in a well and stable condition. A CBC will be obtained 4 hours postop  as a baseline value to evaluate future bleeding.   Little girl named Wyn Forster was born at 3:15 p.m., received an Apgar of 7 at 1  minute, 10 at 5 minutes and weighed 6 pounds 5 ounces.      Crist Fat Rivard, M.D.  Electronically Signed     SAR/MEDQ  D:  08/29/2005  T:  08/29/2005  Job:  [id number]

## 2010-12-27 NOTE — Op Note (Signed)
[facility]  Patient:    [name]                  MRN: [id number] Proc. Date: 11/03/00 Adm. Date:  [id number] Disc. Date: [id number] Attending:  Genia Del                           Operative Report  DATE OF BIRTH:                Dec 09, 1975  PREOPERATIVE DIAGNOSES:       1. Primary infertility.                               2. History of pelvic endometriosis, status post                                  probable left ovariectomy.  POSTOPERATIVE DIAGNOSES:      1. Primary infertility.                               2. History of pelvic endometriosis, status post                                  probable left ovariectomy.                               3. Absent left ovary and left tube.                               4. Right tubo-ovarian adhesions.                               5. Mild pelvic endometriosis.  SURGEON:                      Genia Del, M.D.  ASSISTANT:                    Pershing Cox, M.D.  ANESTHESIA:                   General.  ANESTHESIOLOGIST:             Raul Del, M.D.  OPERATION:                    1. Operative laparoscopy with lysis of right                                  adnexal adhesions.                               2. Indigo carmine hydrotubation.                               3. Lysis of uterointestinal adhesions.  ESTIMATED BLOOD LOSS:  Minimal.  DESCRIPTION OF PROCEDURE:     Under general anesthesia, the patient is in lithotomy position for operative laparoscopy. She is prepped with Betadine and the abdominal, suprapubic, vulvar, and vaginal areas. The bladder is then emptied with a catheter and the patient is draped as usual. The vaginal exam reveals an anteverted uterus, normal volume, no adnexal mass, normal cervix. The speculum is entered into the vagina, the anterior lip of the cervix is grasped, and the uterus is cannulated. We then removed the speculum  ______ and because of a previous median incision going up the umbilicus, the decision is taken to proceed with an open laparoscopy. We therefore make an infraumbilical incision over about 2 cm with the scalpel. We use retractors and go progressively with the Metzenbaum scissors opening the peritoneum under direct vision. No bowel adhesion is present at that level. The trocar is inserted and a Vicryl 0 is used to fix the trocar at the level of the aponeurosis on each side. We then start the CO2 to create a pneumoperitoneum. The laparoscope is then entered. A suprapubic incision over 5 mm is made with the scalpel and then a 5 mm trocar is entered at that level. No adhesion is present on the anterior wall. We inspect the pelvic cavity revealing a normal uterus except for a thick adhesion between the posterior fundus and the small bowel. The left adnexa is absent; no tube and no ovary on that side. On the right side, the ovary is normal but adhesions are present between the tube and that ovary as well as the ovarian fossa. The distal part of the tube is fixed to the ovarian fossa. Lesions of endometriosis are present on the left uterosacral ligament with old lesions, mainly fibrosis, and some retraction. In the right ovarian fossa, small superficial blue lesions of endometriosis are seen; pictures are taken. The abdominal inspection is unremarkable. Pictures are taken of the appendix which is normal in appearance. We then create a site at the level of the left iliac area. We use the scalpel to do a 5 mm incision and then insert a 5 mm trocar at that level. With the bipolar and then the rotary scissors, the uterointestinal adhesion is released. We then release the adhesions between the distal part of the right tube and the ovarian fossa as well as the ovary. At the end of the adhesiolysis, the distal part of the right tube is completely mobile. The fimbria are looking good. Indigo carmine was  injected before adhesiolysis and no patency was documented. No indigo carmine was coming out of the distal part of the tube. Now, after adhesiolysis, the tube is shown to be patent with good indigo carmine output at the fimbria. The fimbria are normal in appearance; no hydrosalpinx is seen. Hemostasis is completed with the bipolar at the level of the tube superficially. We then irrigate and suction the pelvic cavity. Good hemostasis is present. Pictures are taken. Also, after adhesiolysis, the surgery being completed, we removed the instruments, evacuated the CO2, the aponeurosis is closed, and the infraumbilical incision with Vicryl 0 and then the skin is reapproximated with Monocryl 4-0 at all incisions. The instruments, vaginally, are removed. The estimated blood loss was minimal, no complication occurred, and the patient was transferred to recovery room in good status.  Note: A cardiac systolic murmur was heard by the anesthesiologist which is probably consistent with a mitral valve prolapse. The patient also has some kind of bone disorder, problems with her  joints previously. She will therefore be referred to an internist to assess her problems and rule out Marfans syndrome. DD:  11/03/00 TD:  11/03/00 Job: [id number]

## 2011-01-29 ENCOUNTER — Ambulatory Visit (INDEPENDENT_AMBULATORY_CARE_PROVIDER_SITE_OTHER): Payer: Managed Care, Other (non HMO) | Admitting: Orthopedic Surgery

## 2011-01-29 ENCOUNTER — Encounter: Payer: Self-pay | Admitting: Orthopedic Surgery

## 2011-01-29 VITALS — HR 76 | Ht 70.5 in | Wt 169.0 lb

## 2011-01-29 DIAGNOSIS — M79609 Pain in unspecified limb: Secondary | ICD-10-CM

## 2011-01-29 DIAGNOSIS — M169 Osteoarthritis of hip, unspecified: Secondary | ICD-10-CM

## 2011-01-29 DIAGNOSIS — M247 Protrusio acetabuli: Secondary | ICD-10-CM

## 2011-01-29 DIAGNOSIS — M4800 Spinal stenosis, site unspecified: Secondary | ICD-10-CM

## 2011-01-29 MED ORDER — DICLOFENAC-MISOPROSTOL 50-0.2 MG PO TABS: 1.0000 | ORAL_TABLET | Freq: Two times a day (BID) | ORAL | Status: DC

## 2011-01-29 MED ORDER — HYDROCODONE-ACETAMINOPHEN 5-325 MG PO TABS: 1.0000 | ORAL_TABLET | Freq: Four times a day (QID) | ORAL | Status: AC | PRN

## 2011-01-29 NOTE — Progress Notes (Signed)
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  She was followed in [facility] by Dr. Julius Bowels.  She last saw him in 2007.  She complains of sharp throbbing stabbing pain which is constant and is 10 out of 10 worsened by standing for long periods including pain at rest and pain at night associated with catching sensation of both hips.  She has lower back pain bilateral lateral thigh pain as well as groin pain.  A complete review of systems was reviewed as described by the patient, she reports weight gain and then complaints as related to the joint denies chest pain shortness of breath heartburn frequency skin changes numbness nervousness easy bleeding excessive thirst or adverse reaction.  She reports ALLERGIES to morphine and pineapple  Surgeries are noted as of RIGHT and LEFT oophorectomy.  I saw her in 2010 for a tibia fracture which was treated with intramedullary nailing  Current medications are ibuprofen  Family history of cancer  Social history she is married.  She is a nonsmoker nondrinker.  She has never used any street drugs.  She is a batch of [employer].  He is a tall slender African American female with a height of 5 10-1/2 and a weight of 169 and a stable regular pulse of 76 and a respiratory rate of 18  Orientation to person place and time normal, mood and affect normal  Ambulation she appears to have a leg length discrepancy if not a RIGHT lower extremity limb.  The upper extremities are normal on inspection, no contractures are seen or noted.  All joints are reduced without subluxation muscle tone normal skin intact  Lower extremities the leg lengths appear to be equal in the supine position.  Her hip flexion seems to be limited to approximately 70 and  this is painful.  Similar motion is noted in each hip.  No internal rotation is possible an attempt to internally rotate the limbs is painful.  There is no instability of the hip joint muscle tone is normal skin is intact  She is normal temperature and all 4 extremities and normal pulses in the lower extremities.  No edema swelling or varicose veins are noted  Sensory exam is normal pathologic reflexes none coordination and balance normal  X-rays AP pelvis, RIGHT and LEFT lateral hip.  Lumbar spine x3.  The findings are  Impression protrusio acetabuli bilateral hips with osteoarthritis Impression lumbar spine degenerative disc disease.  Plan: The first like to do is place her on an anti-inflammatory, other than ibuprofen. Patient also given analgesic but I would not go past hydrocodone 5 mg if she needs more than that she may actually do well at a pain center, to better manage her chronic pain.  She should anticipate a lumbar stabilization program.  It is obvious that she will need hip surgery.  Her case is very complicated by the protrusio and her young age.  This is best handled by a reconstructive specialist.  We will do the best we can until that time comes.

## 2011-01-29 NOTE — Progress Notes (Signed)
X-ray report  AP pelvis  Frog laterals both hips  [place] L. Spine  What we find on the AP pelvis is protrusio of the acetabulum severe with degenerative arthritis.  On the L-spine x-rays refined spondylolyses at L5-S1, facet arthritis at L4 and 5 and L5 and S1.  Mild scoliosis.  Impression osteoarthritis of the hips with protrusio acetabula and degenerative scoliosis and spondylolyses L. Spine.

## 2011-01-29 NOTE — Patient Instructions (Addendum)
Please give Dr. Juliene Pina office a call [phone] ask for Adelina Mings in finances, after you get appt scheduled with Dr. Juliene Pina office give [place] a call and we will send over your notes  Referral to PT [facility]

## 2011-01-30 ENCOUNTER — Telehealth: Payer: Self-pay | Admitting: Orthopedic Surgery

## 2011-01-30 NOTE — Telephone Encounter (Signed)
Patient called to speak with nurse  re: information she said was needed per her visit here yesterday. She gave the following information to have notes faxed: Dr. Chrissie Noa Ward ph [phone], fax [phone]; advised about authorization if not one on file. RE: prescription; I reminded her it is ready for pick up here.  Patient ph#'s (cell is best) [phone], home [phone]

## 2011-01-31 ENCOUNTER — Telehealth: Payer: Self-pay | Admitting: *Deleted

## 2011-01-31 NOTE — Telephone Encounter (Signed)
Faxed notes to Dr. Jolyn Nap office per patient request

## 2011-01-31 NOTE — Telephone Encounter (Signed)
Noted above.

## 2011-02-25 NOTE — Telephone Encounter (Signed)
Done, on date noted.

## 2011-03-06 DIAGNOSIS — H6120 Impacted cerumen, unspecified ear: Secondary | ICD-10-CM | POA: Insufficient documentation

## 2011-03-11 ENCOUNTER — Other Ambulatory Visit: Payer: Self-pay | Admitting: *Deleted

## 2011-03-11 NOTE — Telephone Encounter (Signed)
Pt has scheduled appointment on Monday. 

## 2011-03-17 ENCOUNTER — Ambulatory Visit (INDEPENDENT_AMBULATORY_CARE_PROVIDER_SITE_OTHER): Payer: Managed Care, Other (non HMO) | Admitting: Internal Medicine

## 2011-03-17 ENCOUNTER — Encounter: Payer: Self-pay | Admitting: Internal Medicine

## 2011-03-17 VITALS — BP 160/86 | HR 70 | Temp 97.2°F | Wt 173.4 lb

## 2011-03-17 DIAGNOSIS — G43909 Migraine, unspecified, not intractable, without status migrainosus: Secondary | ICD-10-CM

## 2011-03-17 DIAGNOSIS — N390 Urinary tract infection, site not specified: Secondary | ICD-10-CM | POA: Insufficient documentation

## 2011-03-17 DIAGNOSIS — Z0000 Encounter for general adult medical examination without abnormal findings: Secondary | ICD-10-CM

## 2011-03-17 DIAGNOSIS — I10 Essential (primary) hypertension: Secondary | ICD-10-CM

## 2011-03-17 DIAGNOSIS — E8941 Symptomatic postprocedural ovarian failure: Secondary | ICD-10-CM | POA: Insufficient documentation

## 2011-03-17 DIAGNOSIS — R300 Dysuria: Secondary | ICD-10-CM

## 2011-03-17 LAB — POCT URINALYSIS DIPSTICK
Glucose, UA: NEGATIVE
Ketones, UA: NEGATIVE
Protein, UA: NEGATIVE
Urobilinogen, UA: 0.2

## 2011-03-17 MED ORDER — CIPROFLOXACIN HCL 500 MG PO TABS: 500.0000 mg | ORAL_TABLET | Freq: Two times a day (BID) | ORAL | Status: AC

## 2011-03-17 MED ORDER — SUMATRIPTAN SUCCINATE 50 MG PO TABS: ORAL_TABLET | ORAL | Status: DC

## 2011-03-17 NOTE — Assessment & Plan Note (Addendum)
UA shows trace leukocytes, pt is symptomatic, will go ahead and treat with ciprofloxacin x 3 days for uncomplicated UTI. - Check urine culture, will change regimen if indicated according to results.

## 2011-03-17 NOTE — Patient Instructions (Addendum)
Please follow-up at the clinic in 1 month with me, at which time we will reevaluate your migraines.  Please follow-up in clinic in 1 week for a lab visit to check your cholesterol and thyroid test.  Please keep a headache diary as we discussed.  You have been started on a medication for your urinary tract infection. Once the urine culture comes back, if another medication is needed, I will call you.  Please follow-up with gynecology, we will help you set it up.  Please address your sleep hygiene as we discussed and as noted below in the notes.   If you have been started on new medication(s), and you develop throat closing, tongue swelling, rash, please stop the medication and call the clinic at [phone] and go to the ER.   If symptoms worsen, or new symptoms arise, please call the clinic or go to the ER.  Please bring all of your medications in a bag to your next visit.   Insomnia Insomnia is frequent trouble falling and/or staying asleep. Insomnia can be a long term problem or a short term problem. Both are common. Insomnia can be a short term problem when the wakefulness is related to a certain stress or worry. Long term insomnia is often related to ongoing stress during waking hours and/or poor sleeping habits. Overtime, sleep deprivation itself can make the problem worse. Every little thing feels more severe because you are overtired and your ability to cope is decreased. SYMPTOMS  Not feeling rested in the morning.   Anxiety and restlessness at bedtime.   Difficulty falling and staying asleep.  CAUSES  Stress, anxiety, and depression.   Poor sleeping habits.   Distractions such as TV in the bedroom.   Naps close to bedtime.   Engaging in emotionally charged conversations before bed.   Technical reading before sleep.   Alcohol and other sedatives. They may make the problem worse. They can hurt normal sleep patterns and normal dream activity.   Stimulants such as  caffeine for several hours prior to bedtime.   Pain syndromes and shortness of breath can cause insomnia.   Exercise late at night.   Changing time zones may cause sleeping problems (jet lag).  It is sometimes helpful to have someone observe your sleeping patterns. They should look for periods of not breathing during the night (sleep apnea). They should also look to see how long those periods last. If you live alone or observers are uncertain, you can also be observed at a sleep clinic where your sleep patterns will be professionally monitored. Sleep apnea requires a checkup and treatment. Give your caregivers your medical history. Give your caregivers observations your family has made about your sleep.   TREATMENT  Your caregiver may prescribe treatment for an underlying medical disorders. Your caregiver can give advice or help if you are using alcohol or other drugs for self-medication. Treatment of underlying problems will usually eliminate insomnia problems.   Medications can be prescribed for short time use. They are generally not recommended for lengthy use.   Over-the-counter sleep medicines are not recommended for lengthy use. They can be habit forming.   You can promote easier sleeping by making lifestyle changes such as:   Using relaxation techniques that help with breathing and reduce muscle tension.   Exercising earlier in the day.   Changing your diet and the time of your last meal. No night time snacks.   Establish a regular time to go to bed.  Counseling can help with stressful problems and worry.   Soothing music and white noise may be helpful if there are background noises you cannot remove.   Stop tedious detailed work at least one hour before bedtime.  HOME CARE INSTRUCTIONS  Keep a diary. Inform your caregiver about your progress. This includes any medication side effects. See your caregiver regularly. Take note of:   Times when you are asleep.   Times when  you are awake during the night.      The quality of your sleep.   How you feel the next day.      This information will help your caregiver care for you.  Get out of bed if you are still awake after 15 minutes. Read or do some quiet activity. Keep the lights down. Wait until you feel sleepy and go back to bed.   Keep regular sleeping and waking hours. Avoid naps.   Exercise regularly.   Avoid distractions at bedtime. Distractions include watching television or engaging in any intense or detailed activity like attempting to balance the household checkbook.   Develop a bedtime ritual. Keep a familiar routine of bathing, brushing your teeth, climbing into bed at the same time each night, listening to soothing music. Routines increase the success of falling to sleep faster.   Use relaxation techniques. This can be using breathing and muscle tension release routines. It can also include visualizing peaceful scenes. You can also help control troubling or intruding thoughts by keeping your mind occupied with boring or repetitive thoughts like the old concept of counting sheep. You can make it more creative like imagining planting one beautiful flower after another in your backyard garden.   During your day, work to eliminate stress. When this is not possible use some of the previous suggestions to help reduce the anxiety that accompanies stressful situations.  MAKE SURE YOU:    Understand these instructions.   Will watch your condition.   Will get help right away if you are not doing well or get worse.  Document Released: 07/25/2000 Document Re-Released: 07/10/2008 [facility] Patient Information 2011 [place], [place].

## 2011-03-17 NOTE — Assessment & Plan Note (Addendum)
Will refer back to Dr. Tenny Craw, who had been managing her hormone replacement therapy was taking estradiol gel, she has started having recurrence of these hot flashes - and therefore will refer back gynecology for further management.

## 2011-03-17 NOTE — Assessment & Plan Note (Signed)
BP Readings from Last 3 Encounters:  03/17/11 160/86  09/06/10 130/80  08/15/10 137/92    Basic Metabolic Panel:    Component Value Date/Time   NA 141 09/06/2010 2031   K 3.5 09/06/2010 2031   CL 103 09/06/2010 2031   CO2 27 09/06/2010 2031   BUN 9 09/06/2010 2031   CREATININE 0.86 09/06/2010 2031   GLUCOSE 84 09/06/2010 2031   CALCIUM 9.6 09/06/2010 2031    Assessment: Hypertension control:   mildly elevated  Progress toward goals:   deteriorated Barriers to meeting goals:  acute pain episode due to migraine.  Plan: Hypertension treatment:  - patient is not currently on antihypertensive therapy, although she seems to carry this diagnosis.  - It is difficult to assess blood pressure control during her acute pain episode with her migraine. - Therefore, will reassess blood pressure at next visit, with consideration of adding therapy if continues to be persistently elevated on multiple future occasions.

## 2011-03-17 NOTE — Assessment & Plan Note (Signed)
-   lipid panel and tsh next week after pt is fasting x 8-10 hours.

## 2011-03-17 NOTE — Progress Notes (Signed)
Subjective:    Patient ID: [name], female    DOB: Sep 13, 1975, 35 y.o.   MRN: [id number]  HPI Pt is a 35 y.o. female who has a PMHX of Anxiety; migraines, Hip deformity, congenital; surgical menopause; Vitamin D deficiency (11/2009) and presents to clinic today for the following:   1) Migraines - has been having migraines since high school. Was previously tried on Imitrex, which was an effective abortive agent for her, but the brand name Imitrex was too expensive for her. States that the migraines had gone away for a while, typically would occur 1-2 times per months, however, within the past 2 weeks has been occuring every 3 days. Does have the migraine present today, described as a unilateral left parietal headache rated 10/10 in severity. Insidious onset. Associated symptoms include nausea, photophobia, phonophobia, visual changes including blurred vision, zig-zag. HA aggravated by noise, light, alleviated by ibuprofen, away from sound, laying in a dark room. Denies recent trauma, MVA, falls. HA is not worsened with alcohol, food, caffeine. Pt confirms recent change in sleep patterns, where she was not sleeping well with difficulty falling asleep and staying asleep x 1 year, then started having the headaches. Also confirms recent weight gain.. Pt does not have menstrual cycle, has not had recent stressors in her life. Confirms family history of migraines in her mother.   2) Sleep disturbance/ hot flashes - reads until she falls asleep. Confirms daytime sleepiness, however denies daytime naps. Denies pets in the bed or partner that snores. Also awakens at 3am almost daily, at times it is to urinate. Denies snoring herself or have been then told that she snores.   3) Dysuria - x3 days with associated frequency of urination. No fevers, chills. Mild abdominal pain over bladder, no flank pain, malaise, fatigue.  4) Disability parking decal request - patient requests disability parking decal  today, secondary to difficulty with ambulation due to her protrusio acetabulum which prohibits her from engaging in long-distance ambulation.   5) Hot flashes - Patient also indicates that she's been having hot flashes intermittently, which were previously well controlled when she was on hormone replacement therapy, which was being prescribed by Dr. Tenny Craw after her hysterectomy. No palpitations, tachycardia, skin changes.   Review of Systems Per HPI.  Current Outpatient Medications Medication Sig  . Calcium 1200-1000 MG-UNIT CHEW Chew 1 tablet by mouth daily.    Marland Kitchen EPINEPHrine (EPIPEN 2-PAK) 0.3 mg/0.3 mL DEVI Use if you experience throat tightness or shortness of breath after exposure to allergen.   . Ibuprofen 200 MG CAPS Take by mouth.      Allergies Morphine and related and Pineapple  Past Medical History  Diagnosis Date  . Anxiety   . Osteoarthritis of hip   . History of migraine headaches     Typical symptoms - bright lights, unilateral (starting behind her left ear), throbbing .  Marland Kitchen Surgical menopause 08/2007    On HRT. Occuring since 01/09 following TAH and R-salpingo-oophorectomy   . Tibial fracture 10/2008    Left - Sustained 2/2 fall down stairs. Patient is S/P closed internal fixation with Smith Nephew tibial nail locked proximal and distal  . Endometriosis     S/P TAH and salpingo-oophorectomy, right  . Chronic cough   . Gastritis     Thought due to chronic NSAID use 2/2 pain from her congenital hip abnormality  . Epigastric pain     Chronic, thought 2/2 gastritis, H.pylori neg (06/08)  . 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 With chronic OA of hip  . Vitamin D deficiency 11/2009    Vit D level 15 in 11/2009  . Closed fibular fracture 10/2008    Left - Sustained 2/2 fall down stairs (same fall as tibular fracture). S/P internal fixation.  . Hidradenitis suppurativa 05/2006    Past Surgical  History  Procedure Date  . Cesarean section 08/2005    Primary low transverse  . Tubal ligation 08/2005    Right tubal ligation  . Laparoscopy 10/2000    Operative laparoscopy with lysis of right adnexal adhesions and uterointestinal adhesions  . Other surgical history 02/2009    Closed treatment internal fixation, left tibia with Katrinka Blazing and Nephew tibial nail locked proximal and distal  . Total abdominal hysterectomy w/ bilateral salpingoophorectomy 08/2007    for endometriosis. With concurrent right salpingo-oophorectomy  (2009), left oophorectomy (2002)  . Left tib fib        Objective:   Physical Exam General: Vital signs reviewed and noted. Well-developed, well-nourished, in no acute distress; alert, appropriate and cooperative throughout examination.  Head: Normocephalic, atraumatic.Tenderness to palpation over forehead and parietal region on left.  Eyes: PERRL, EOMI, non-injected.  Ears: TM nonerythematous, not bulging, good light reflex bilaterally.  Nose: Mucous membranes moist, not inflammed, nonerythematous.  Throat: Oropharynx nonerythematous, no exudate appreciated.   Neck: No deformities, masses, or tenderness noted.  Lungs:  Normal respiratory effort. Clear to auscultation BL without crackles or wheezes.  Heart: RRR. S1 and S2 normal without gallop, murmur, or rubs.  Abdomen:  BS normoactive. Soft, Nondistended, non-tender.  No masses or organomegaly.  Extremities: No pretibial edema.        Assessment & Plan:  Case and plan of care discussed with Dr. Blanch Media.

## 2011-03-17 NOTE — Assessment & Plan Note (Addendum)
Migraine possibly precipitated by persistent sleep disturbance. Imitrex worked well for her before. Infrequency of migraines, so will not start prophylactic medication at this time, but can consider in the future if inidicated. - Headache diary recommended to patient so we can better evaluate timing of migraines as well as possible precipitants. - Information given regarding sleep hygiene. - Generic sumatriptan will be sent to pharmacy.

## 2011-03-18 LAB — URINALYSIS, ROUTINE W REFLEX MICROSCOPIC
Bilirubin Urine: NEGATIVE
Glucose, UA: NEGATIVE mg/dL
Ketones, ur: NEGATIVE mg/dL
Nitrite: NEGATIVE
Protein, ur: NEGATIVE mg/dL
Specific Gravity, Urine: 1.019 (ref 1.005–1.030)
Urobilinogen, UA: 0.2 mg/dL (ref 0.0–1.0)
pH: 5.5 (ref 5.0–8.0)

## 2011-03-18 LAB — URINALYSIS, MICROSCOPIC ONLY

## 2011-03-20 LAB — URINE CULTURE

## 2011-03-24 ENCOUNTER — Other Ambulatory Visit: Payer: Managed Care, Other (non HMO)

## 2011-05-01 LAB — COMPREHENSIVE METABOLIC PANEL
Alkaline Phosphatase: 50
BUN: 5 — ABNORMAL LOW
Chloride: 107
GFR calc non Af Amer: >60
Glucose, Bld: 87
Potassium: 3.8
Total Bilirubin: 1.2

## 2011-05-01 LAB — PROTIME-INR
INR: 1.1
Prothrombin Time: 14.1

## 2011-05-01 LAB — CBC
Hemoglobin: 9.9 — ABNORMAL LOW
MCHC: 33.4
MCHC: 33.9
Platelets: 268
RBC: 3.81 — ABNORMAL LOW
RBC: 4.12
RDW: 15.7 — ABNORMAL HIGH
WBC: 7.4
WBC: 9.9

## 2011-05-01 LAB — APTT: aPTT: 29

## 2011-05-01 LAB — TYPE AND SCREEN
ABO/RH(D): B POS
Antibody Screen: NEGATIVE

## 2011-05-01 LAB — DIFFERENTIAL
Basophils Absolute: 0.1
Basophils Relative: 1
Neutro Abs: 4.5
Neutrophils Relative %: 61

## 2011-05-15 ENCOUNTER — Ambulatory Visit (INDEPENDENT_AMBULATORY_CARE_PROVIDER_SITE_OTHER): Payer: Managed Care, Other (non HMO) | Admitting: Internal Medicine

## 2011-05-15 ENCOUNTER — Encounter: Payer: Self-pay | Admitting: Internal Medicine

## 2011-05-15 DIAGNOSIS — R1313 Dysphagia, pharyngeal phase: Secondary | ICD-10-CM

## 2011-05-15 DIAGNOSIS — Z803 Family history of malignant neoplasm of breast: Secondary | ICD-10-CM

## 2011-05-15 DIAGNOSIS — M247 Protrusio acetabuli: Secondary | ICD-10-CM

## 2011-05-15 DIAGNOSIS — D649 Anemia, unspecified: Secondary | ICD-10-CM

## 2011-05-15 LAB — CBC WITH DIFFERENTIAL/PLATELET
Basophils Relative: 0 % (ref 0–1)
Lymphocytes Relative: 28 % (ref 12–46)
MCH: 27 pg (ref 26.0–34.0)
MCV: 86 fL (ref 78.0–100.0)
Monocytes Absolute: 0.7 10*3/uL (ref 0.1–1.0)
Platelets: 338 10*3/uL (ref 150–400)
RDW: 14.3 % (ref 11.5–15.5)
WBC: 7 10*3/uL (ref 4.0–10.5)

## 2011-05-15 LAB — FERRITIN: Ferritin: 49 ng/mL (ref 10–291)

## 2011-05-15 NOTE — Patient Instructions (Signed)
Please, follow up with all the tests ordered. Please, call with any questions and follow up with me after tests are done.

## 2011-05-15 NOTE — Progress Notes (Signed)
  Subjective:    Patient ID: [name], female    DOB: 1976-03-14, 35 y.o.   MRN: [id number]  HPI  1. Patient c/o chronic difficulty with swallowing and sensation "as if something stuck in her throat" for 1 year. Patient states that Sx had an incudius onset with first affecting swallowing solid foods and later liquids as well. Patient denies any trauma; any fever, chills, Nause, vomiting, belching; food regurgitation; heartburn, weight loss (but endorses unintentional 20 lbs gain over 1 year); abdominal pain, constipation, diarrhea.  No personal history of cancers. Sister diagnosed with BCA.  Review of Systems  Constitutional: Positive for fatigue and unexpected weight change. Negative for fever, chills, diaphoresis, activity change and appetite change.  HENT: Negative.   Eyes: Negative for visual disturbance.  Respiratory: Positive for choking. Negative for apnea, cough, chest tightness, shortness of breath, wheezing and stridor.   Cardiovascular: Negative for chest pain, palpitations and leg swelling.  Gastrointestinal: Negative for abdominal distention.  Neurological: Negative for dizziness, syncope and weakness.  Hematological: Negative for adenopathy. Does not bruise/bleed easily.  Psychiatric/Behavioral: Negative for hallucinations, behavioral problems and dysphoric mood.       Objective:   Physical Exam  Vitals:reviewed General: alert, well-developed, and cooperative to examination.  Head: normocephalic and atraumatic.  Eyes: vision grossly intact, pupils equal, pupils round, pupils reactive to light, no injection and anicteric.  Mouth: pharynx pink and moist, no erythema, and no exudates.  Neck: supple, full ROM, no thyromegaly, no JVD, and no carotid bruits.  Lungs: normal respiratory effort, no accessory muscle use, normal breath sounds, no crackles, and no wheezes. Heart: normal rate, regular rhythm, no murmur, no gallop, and no rub.  Abdomen: soft, non-tender,  normal bowel sounds, no distention, no guarding, no rebound tenderness, no hepatomegaly, and no splenomegaly.  Msk: no joint swelling, no joint warmth, and no redness over joints.  Pulses: 2+ DP/PT pulses bilaterally Extremities: No cyanosis, clubbing, edema Neurologic: alert & oriented X3, cranial nerves II-XII intact, strength normal in all extremities, sensation intact to light touch, and gait normal.  Skin: turgor normal and no rashes.  Psych: Oriented X3, memory intact for recent and remote, normally interactive, good eye contact, not anxious appearing, and not depressed appearing.         Assessment & Plan:  1. Dysphagia, unclear etiology. This can be caused by Szhatzki's ring or diverticulum, cervical osteophytes, oropharengeal tumors, achalasia. Plummer-vinson syndrome also ned to be considered; given history of microcytic anemia. However, the patient does not exhibit glossitis.  -C-spine XR -Barrium Swallow 2. Anemia, microcytic -no FoBT (patient is on Iron therapy) -peripheral smear -ferritin level  3. [facility] cancer -referred to breast cancer center for a genetic counseling. -mammogram

## 2011-05-16 ENCOUNTER — Ambulatory Visit (HOSPITAL_COMMUNITY)
Admission: RE | Admit: 2011-05-16 | Discharge: 2011-05-16 | Disposition: A | Discharge status: Home / Self Care | Payer: Managed Care, Other (non HMO) | Source: Ambulatory Visit | Attending: Internal Medicine | Admitting: Internal Medicine

## 2011-05-16 DIAGNOSIS — R1310 Dysphagia, unspecified: Secondary | ICD-10-CM | POA: Insufficient documentation

## 2011-05-16 DIAGNOSIS — K449 Diaphragmatic hernia without obstruction or gangrene: Secondary | ICD-10-CM | POA: Insufficient documentation

## 2011-05-16 DIAGNOSIS — R1313 Dysphagia, pharyngeal phase: Secondary | ICD-10-CM

## 2011-05-16 DIAGNOSIS — K219 Gastro-esophageal reflux disease without esophagitis: Secondary | ICD-10-CM | POA: Insufficient documentation

## 2011-05-16 LAB — PATHOLOGIST SMEAR REVIEW

## 2011-05-19 ENCOUNTER — Encounter: Payer: Self-pay | Admitting: Internal Medicine

## 2011-05-19 ENCOUNTER — Ambulatory Visit (INDEPENDENT_AMBULATORY_CARE_PROVIDER_SITE_OTHER): Payer: Managed Care, Other (non HMO) | Admitting: Internal Medicine

## 2011-05-19 DIAGNOSIS — R1310 Dysphagia, unspecified: Secondary | ICD-10-CM

## 2011-05-19 DIAGNOSIS — R1084 Generalized abdominal pain: Secondary | ICD-10-CM

## 2011-05-19 NOTE — Progress Notes (Signed)
  Subjective:    Patient ID: [name], female    DOB: 1976-03-30, 35 y.o.   MRN: [id number]  HPI  F/u appointment on dysphagia. Denies any new concerns.  Review of Systems No New Sx.    Objective:   Physical Exam Vitals: noted General: alert, well-developed, and cooperative to examination.  Head: normocephalic and atraumatic.  Eyes: vision grossly intact, pupils equal, pupils round, pupils reactive to light, no injection and anicteric.  Mouth: pharynx pink and moist, no erythema, and no exudates.  Neck: supple, full ROM, no thyromegaly, no JVD, and no carotid bruits.  Lungs: normal respiratory effort, no accessory muscle use, normal breath sounds, no crackles, and no wheezes. Heart: normal rate, regular rhythm, no murmur, no gallop, and no rub.  Abdomen: soft, non-tender, normal bowel sounds, no distention, no guarding, no rebound tenderness, no hepatomegaly, and no splenomegaly.  Msk: no joint swelling, no joint warmth, and no redness over joints.  Pulses: 2+ DP/PT pulses bilaterally Extremities: No cyanosis, clubbing, edema Neurologic: alert & oriented X3, cranial nerves II-XII intact, strength normal in all extremities, sensation intact to light touch, and gait normal.  Skin: turgor normal and no rashes.  Psych: Oriented X3, memory intact for recent and remote, normally interactive, good eye contact, not anxious appearing, and not depressed appearing.          Assessment & Plan:  1. Dysphagia r/r cricopharyngeal muscle prominence per Barium swallow study. -discussed with Dr. Rogelia Boga. -Referred to GI for a possible cricopharyngeal dilatation.

## 2011-05-19 NOTE — Patient Instructions (Signed)
Please, follow up with a gastroenterologist and avoid eating coarse foods for now.

## 2011-05-26 ENCOUNTER — Telehealth: Payer: Self-pay | Admitting: *Deleted

## 2011-05-26 NOTE — Telephone Encounter (Signed)
Pt calls to ask about referral ordered at last visit, please call [phone]  Thanks,h.

## 2011-05-27 NOTE — Progress Notes (Signed)
Addended by: Maura Crandall on: 05/27/2011 08:51 AM   Modules accepted: Orders

## 2011-06-05 ENCOUNTER — Ambulatory Visit (HOSPITAL_COMMUNITY)
Admission: RE | Admit: 2011-06-05 | Discharge: 2011-06-05 | Disposition: A | Discharge status: Home / Self Care | Payer: Managed Care, Other (non HMO) | Source: Ambulatory Visit | Attending: Internal Medicine | Admitting: Internal Medicine

## 2011-06-05 ENCOUNTER — Ambulatory Visit (INDEPENDENT_AMBULATORY_CARE_PROVIDER_SITE_OTHER): Payer: Managed Care, Other (non HMO) | Admitting: Obstetrics and Gynecology

## 2011-06-05 VITALS — BP 149/87 | HR 89 | Temp 97.3°F | Ht 70.5 in | Wt 174.2 lb

## 2011-06-05 DIAGNOSIS — Z803 Family history of malignant neoplasm of breast: Secondary | ICD-10-CM

## 2011-06-05 DIAGNOSIS — N951 Menopausal and female climacteric states: Secondary | ICD-10-CM

## 2011-06-05 DIAGNOSIS — Z1231 Encounter for screening mammogram for malignant neoplasm of breast: Secondary | ICD-10-CM | POA: Insufficient documentation

## 2011-06-05 LAB — T3, FREE: T3, Free: 3.1 pg/mL (ref 2.3–4.2)

## 2011-06-05 LAB — TSH: TSH: 0.887 u[IU]/mL (ref 0.350–4.500)

## 2011-06-05 LAB — T4, FREE: Free T4: 1.13 ng/dL (ref 0.80–1.80)

## 2011-06-05 MED ORDER — ESTROGENS CONJUGATED 0.625 MG PO TABS: 0.6250 mg | ORAL_TABLET | Freq: Every day | ORAL | Status: DC

## 2011-06-05 NOTE — Progress Notes (Signed)
Pt declines on receiving the flu vaccine.

## 2011-06-05 NOTE — Progress Notes (Signed)
35 year old [place] female who underwent total abdominal hysterectomy with bilateral salpingo-oophorectomy 3 years ago for endometriosis. She was placed on estrogen therapy following the procedure the when she ran out of her other doctor would not start her back. She's having problems with hot flashes sleep difficulty moved swings and weight gain that she attributes to surgical menopause. I've told her probably we can help the hot flashes and sleep difficulty she may get some improvement from the mood swings but I'm not certain that the weight gain is going to be much effected either way. When to start her on estrogen 625 mg daily I've told her it may take as much as a month or more to know whether it's working and will bring her back in a couple months to see how she is doing.

## 2011-06-11 DIAGNOSIS — Z96643 Presence of artificial hip joint, bilateral: Secondary | ICD-10-CM

## 2011-06-11 DIAGNOSIS — Z96641 Presence of right artificial hip joint: Secondary | ICD-10-CM | POA: Insufficient documentation

## 2011-06-11 HISTORY — DX: Presence of artificial hip joint, bilateral: Z96.643

## 2011-06-19 ENCOUNTER — Encounter: Payer: Self-pay | Admitting: Internal Medicine

## 2011-06-19 ENCOUNTER — Telehealth: Payer: Self-pay | Admitting: *Deleted

## 2011-06-19 ENCOUNTER — Ambulatory Visit (INDEPENDENT_AMBULATORY_CARE_PROVIDER_SITE_OTHER): Payer: Managed Care, Other (non HMO) | Admitting: Internal Medicine

## 2011-06-19 VITALS — BP 162/92 | HR 52 | Temp 97.7°F | Ht 70.5 in | Wt 179.6 lb

## 2011-06-19 DIAGNOSIS — M247 Protrusio acetabuli: Secondary | ICD-10-CM

## 2011-06-19 DIAGNOSIS — J020 Streptococcal pharyngitis: Secondary | ICD-10-CM

## 2011-06-19 DIAGNOSIS — I10 Essential (primary) hypertension: Secondary | ICD-10-CM

## 2011-06-19 MED ORDER — AMOXICILLIN 500 MG PO CAPS: 500.0000 mg | ORAL_CAPSULE | Freq: Two times a day (BID) | ORAL | Status: DC

## 2011-06-19 MED ORDER — ESTRADIOL 1 MG/GM TD GEL: 1.0000 mg | Freq: Every day | TRANSDERMAL | Status: DC

## 2011-06-19 NOTE — Assessment & Plan Note (Signed)
Patient concerned about which type of exercises that she can do for weight loss. Have recommended physical therapy to work with her in terms of various different exercises.

## 2011-06-19 NOTE — Progress Notes (Signed)
  Subjective:    Patient ID: [name], female    DOB: 1976-07-04, 35 y.o.   MRN: [id number]  HPI [name] is a 35 year old woman with pmh significant for Anxiety, HTN, OA of hip who presents for:  1.) Sore throat - duration of few days. Sick contacts - daughter and son has strep. Complains of subjective fevers and chills. Also complains of body aches. Little to no cough that is dry. Very painful upon swallowing. No hx of tonsillectomy.   Patient has no other complaints or concerns today. She denies chest pain, sob, headache, N/V, changes in abdominal and urinary character.    Review of Systems  All other systems reviewed and are negative.       Objective:   Physical Exam  Constitutional: She appears well-developed.  HENT:  Head: Normocephalic and atraumatic.  Right Ear: External ear normal.  Left Ear: External ear normal.  Mouth/Throat: Oropharyngeal exudate present.  Neck:       Cervical tenderness but no adenopathy noted  Cardiovascular: Normal rate and regular rhythm.   Pulmonary/Chest: Effort normal and breath sounds normal. She has no wheezes. She has no rales.  Skin: Skin is warm and dry.          Assessment & Plan:

## 2011-06-19 NOTE — Assessment & Plan Note (Addendum)
  BP Readings from Last 3 Encounters:  06/19/11 162/92  06/05/11 149/87  05/19/11 154/97   Blood pressure has been elevated on more than one occasion. Her vitals were reviewed over the last year and she has several blood pressures that are in the 140s to 160s range. This was addressed at her last visit by her PCP and at that time her acute elevation in blood pressure was thought to be secondary to migraine. Today patient does present with acute complaints however it is less likely that her blood pressure is related to this. Patient states that she has a lot of apprehension when coming to the doctor and that it might be attributed to this. Patient was counseled and it was discussed whether patient should be started on an antihypertensive. Patient to like to hold off on this right now. Patient was advised to check her blood pressure at a local pharmacy and if it is still elevated to come to the clinic for followup. Patient is agreeable to this plan

## 2011-06-19 NOTE — Telephone Encounter (Signed)
Pt left message stating that she would like to know her test results from last clinic visit

## 2011-06-19 NOTE — Assessment & Plan Note (Signed)
Symptoms likely strep pharyngitis as patient has sore throat with absence of cough, subjective fevers and exudates. Also positive family history with both son and daughter having confirmed strep pharyngitis. Will prescribe patient amoxicillin and have her followup if her symptoms do not improve.

## 2011-06-19 NOTE — Patient Instructions (Signed)
Please check your blood pressure at the local pharmacy or [facility]. If your blood pressure is still elevated above 140/90 then you should contact your doctor. Please take antibiotic until completed. Please follow up if your symptoms do not improve. Please follow up with physical therapy as scheduled.

## 2011-06-20 NOTE — Telephone Encounter (Signed)
Called pt and left message that her test results were normal. She may call back if she has further questions.

## 2011-06-22 ENCOUNTER — Emergency Department (INDEPENDENT_AMBULATORY_CARE_PROVIDER_SITE_OTHER)
Admission: EM | Admit: 2011-06-22 | Discharge: 2011-06-22 | Disposition: A | Discharge status: Home / Self Care | Payer: Managed Care, Other (non HMO) | Source: Home / Self Care

## 2011-06-22 ENCOUNTER — Encounter (HOSPITAL_COMMUNITY): Payer: Self-pay

## 2011-06-22 DIAGNOSIS — N3090 Cystitis, unspecified without hematuria: Secondary | ICD-10-CM

## 2011-06-22 DIAGNOSIS — J020 Streptococcal pharyngitis: Secondary | ICD-10-CM

## 2011-06-22 LAB — URINALYSIS, ROUTINE W REFLEX MICROSCOPIC
Glucose, UA: NEGATIVE mg/dL
Ketones, ur: NEGATIVE mg/dL
Nitrite: NEGATIVE
Protein, ur: NEGATIVE mg/dL
Urobilinogen, UA: 0.2 mg/dL (ref 0.0–1.0)

## 2011-06-22 LAB — URINE MICROSCOPIC-ADD ON

## 2011-06-22 LAB — POCT URINALYSIS DIP (DEVICE)
Glucose, UA: NEGATIVE mg/dL
Leukocytes, UA: NEGATIVE
Nitrite: NEGATIVE
Protein, ur: NEGATIVE mg/dL
Urobilinogen, UA: 0.2 mg/dL (ref 0.0–1.0)

## 2011-06-22 MED ORDER — FLUCONAZOLE 200 MG PO TABS: ORAL_TABLET | ORAL | Status: DC

## 2011-06-22 MED ORDER — SULFAMETHOXAZOLE-TRIMETHOPRIM 800-160 MG PO TABS: 1.0000 | ORAL_TABLET | Freq: Two times a day (BID) | ORAL | Status: DC

## 2011-06-22 MED ORDER — PHENAZOPYRIDINE HCL 200 MG PO TABS: 200.0000 mg | ORAL_TABLET | Freq: Three times a day (TID) | ORAL | Status: DC | PRN

## 2011-06-22 NOTE — ED Provider Notes (Signed)
History     CSN: [id number] Arrival date & time: 06/22/2011  3:18 PM   First MD Initiated Contact with Patient 06/22/11 1416      Chief Complaint  Patient presents with  . Abdominal Pain    Pt has abd pain and burning with urination, also has strep and has been taking amox since Thursday    (Consider location/radiation/quality/duration/timing/severity/associated sxs/prior treatment) HPI Comments: Pt with oderous urine, urinary urgency, frequency, dysuria, hesitancy x 1 week. No hematuria, vulvar itching, genital rash, Vaginal bleeding, vaginal d/c.No back pain, abd distension, fevers.  S/p hysterectomy. Sexually active with husband who is asxatic. STD's not a concern today. States feels similar to previous uti's.  states that she often holds her urine at night. Pt currently on amoxicillin day # 3 for strep pharyngitis.   Patient is a 35 y.o. female presenting with dysuria.  Dysuria  This is a new problem. The current episode started more than 1 week ago. The problem occurs every urination. The problem has not changed since onset.There has been no fever. She is sexually active. Associated symptoms include nausea, frequency and urgency. Pertinent negatives include no chills, no vomiting, no discharge, no hematuria and no flank pain. She has tried nothing for the symptoms. Her past medical history does not include kidney stones.    Past Medical History  Diagnosis Date  . Anxiety   . Osteoarthritis of hip   . History of migraine headaches     Typical symptoms - bright lights, unilateral (starting behind her left ear), throbbing .  Marland Kitchen Surgical menopause 08/2007    On HRT. Occuring since 01/09 following TAH and R-salpingo-oophorectomy   . Tibial fracture 10/2008    Left - Sustained 2/2 fall down stairs. Patient is S/P closed internal fixation with Smith Nephew tibial nail locked proximal and distal  . Endometriosis     S/P TAH and salpingo-oophorectomy, right  . Chronic cough   .  Gastritis     Thought due to chronic NSAID use 2/2 pain from her congenital hip abnormality  . Epigastric pain     Chronic, thought 2/2 gastritis, H.pylori neg (06/08)  . 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 With chronic OA of hip  . Vitamin D deficiency 11/2009    Vit D level 15 in 11/2009  . Closed fibular fracture 10/2008    Left - Sustained 2/2 fall down stairs (same fall as tibular fracture). S/P internal fixation.  . Hidradenitis suppurativa 05/2006    Past Surgical History  Procedure Date  . Cesarean section 08/2005    Primary low transverse  . Tubal ligation 08/2005    Right tubal ligation  . Laparoscopy 10/2000    Operative laparoscopy with lysis of right adnexal adhesions and uterointestinal adhesions  . Other surgical history 02/2009    Closed treatment internal fixation, left tibia with Katrinka Blazing and Nephew tibial nail locked proximal and distal  . Total abdominal hysterectomy w/ bilateral salpingoophorectomy 08/2007    for endometriosis. With concurrent right salpingo-oophorectomy  (2009), left oophorectomy (2002)  . Left tib fib   . Fracture surgery   . Abdominal hysterectomy   . Joint replacement     Family History  Problem Relation Age of Onset  . Diabetes type II    . Arthritis    . Ovarian cancer Cousin   . Cancer      History  Substance Use Topics  . Smoking status: Never  Smoker   . Smokeless tobacco: Not on file  . Alcohol Use: No    OB History    Grav Para Term Preterm Abortions TAB SAB Ect Mult Living                  Review of Systems  Constitutional: Negative for fever and chills.  Gastrointestinal: Positive for nausea. Negative for vomiting and abdominal pain.  Genitourinary: Positive for dysuria, urgency and frequency. Negative for hematuria, flank pain, vaginal bleeding, vaginal discharge and pelvic pain.  Musculoskeletal: Negative for myalgias and back pain.  Skin:  Negative for rash.    Allergies  Morphine and related and Pineapple  Home Medications   Current Outpatient Rx  Name Route Sig Dispense Refill  . AMOXICILLIN 500 MG PO CAPS Oral Take 1 capsule (500 mg total) by mouth 2 (two) times daily. 14 capsule 0  . CALCIUM 1200-1000 MG-UNIT PO CHEW Oral Chew 1 tablet by mouth daily.      Marland Kitchen CALCIUM CITRATE-VITAMIN D 315-200 MG-UNIT PO TABS Oral Take 1 tablet by mouth daily.      Marland Kitchen EPINEPHRINE 0.3 MG/0.3ML IJ DEVI  Use if you experience throat tightness or shortness of breath after exposure to allergen.     Marland Kitchen ESTRADIOL 1 MG/GM TD GEL Transdermal Place 1 mg onto the skin daily. Apply 0.25mg  to upper thigh daily for treatment of hot flashes. 1 g 0  . ESTROGENS CONJUGATED 0.625 MG PO TABS Oral Take 1 tablet (0.625 mg total) by mouth daily. Take daily for 21 days then do not take for 7 days. 30 tablet 12  . IBUPROFEN 200 MG PO CAPS Oral Take by mouth.      . SUMATRIPTAN SUCCINATE 50 MG PO TABS  Can take an additional dose 2 hours later if initial dose ineffective. Do not take more than 2 tablets/ day. 30 tablet 1    BP 190/113  Pulse 55  Temp(Src) 99.3 F (37.4 C) (Oral)  Resp 16  SpO2 98%  Physical Exam  Nursing note and vitals reviewed. Constitutional: She is oriented to person, place, and time. She appears well-developed and well-nourished.  HENT:  Head: Normocephalic and atraumatic.  Eyes: Conjunctivae and EOM are normal. Pupils are equal, round, and reactive to light.  Neck: Normal range of motion.  Cardiovascular: Normal rate, regular rhythm and normal heart sounds.   No murmur heard. Pulmonary/Chest: Effort normal and breath sounds normal. No respiratory distress. She has no wheezes. She has no rales. She exhibits no tenderness.  Abdominal: Soft. Bowel sounds are normal. She exhibits no distension. There is tenderness in the suprapubic area. There is no rigidity, no rebound and no guarding.  Genitourinary:       Pt declined pelvic    Musculoskeletal: Normal range of motion. She exhibits no edema and no tenderness.  Neurological: She is alert and oriented to person, place, and time.  Skin: Skin is warm and dry. No rash noted.  Psychiatric: She has a normal mood and affect. Her behavior is normal. Judgment and thought content normal.    ED Course  Procedures (including critical care time)  Labs Reviewed  POCT URINALYSIS DIP (DEVICE) - Abnormal; Notable for the following:    Hgb urine dipstick TRACE (*)    All other components within normal limits  POCT URINALYSIS DIPSTICK   Results for orders placed during the hospital encounter of 06/22/11  POCT URINALYSIS DIP (DEVICE)      Component Value Range   Glucose, UA  NEGATIVE  NEGATIVE (mg/dL)   Bilirubin Urine NEGATIVE  NEGATIVE    Ketones, ur NEGATIVE  NEGATIVE (mg/dL)   Specific Gravity, Urine 1.025  1.005 - 1.030    Hgb urine dipstick TRACE (*) NEGATIVE    pH 6.0  5.0 - 8.0    Protein, ur NEGATIVE  NEGATIVE (mg/dL)   Urobilinogen, UA 0.2  0.0 - 1.0 (mg/dL)   Nitrite NEGATIVE  NEGATIVE    Leukocytes, UA NEGATIVE  NEGATIVE      MDM  Recently tx'd for strep pharyngitis 11/8. willl send urine off for c&S. H&P most c/w cystitis. BP noted. Pt denied CP, HA, visual changes. Has h/o HTN. Pt to f/u with PMD.   Danella Maiers [facility] 06/22/11 2058

## 2011-06-24 ENCOUNTER — Telehealth: Payer: Self-pay | Admitting: Gastroenterology

## 2011-06-24 ENCOUNTER — Ambulatory Visit: Payer: Managed Care, Other (non HMO) | Admitting: Gastroenterology

## 2011-06-24 LAB — URINE CULTURE: Colony Count: >=100000

## 2011-06-24 NOTE — Telephone Encounter (Signed)
Pt was a no show

## 2011-06-25 ENCOUNTER — Encounter: Payer: Self-pay | Admitting: Gastroenterology

## 2011-06-25 ENCOUNTER — Ambulatory Visit (INDEPENDENT_AMBULATORY_CARE_PROVIDER_SITE_OTHER): Payer: Managed Care, Other (non HMO) | Admitting: Gastroenterology

## 2011-06-25 VITALS — BP 134/83 | HR 65 | Temp 98.4°F | Ht 70.0 in | Wt 175.4 lb

## 2011-06-25 DIAGNOSIS — R1310 Dysphagia, unspecified: Secondary | ICD-10-CM

## 2011-06-25 DIAGNOSIS — D649 Anemia, unspecified: Secondary | ICD-10-CM

## 2011-06-25 DIAGNOSIS — R1314 Dysphagia, pharyngoesophageal phase: Secondary | ICD-10-CM

## 2011-06-25 DIAGNOSIS — R1319 Other dysphagia: Secondary | ICD-10-CM

## 2011-06-25 DIAGNOSIS — R110 Nausea: Secondary | ICD-10-CM

## 2011-06-25 NOTE — Patient Instructions (Signed)
We have scheduled you for an upper endoscopy. Please see separate instructions.  

## 2011-06-25 NOTE — Progress Notes (Signed)
Primary Care Physician:  Suszanne Finch, DO  Primary Gastroenterologist: Jonette Eva, MD   Chief Complaint  Patient presents with  . Dysphagia    HPI:  [name] is a 35 y.o. female here for further evaluation of several month h/o difficulty swallowing. Avoiding meats all together. Can eat soft foods and soups. Sometimes foods stick and tries to come up. Difficulty resting at night, feels like breathing cut off. No heartburn, odynophagia. Some lower abdominal pain. Dull ache, intermittent and sometimes lingers. Not related to foods or BMs. BM regular, melena, brbpr. 20 lb weight loss over one year per PCP note. Recently had barium esophagram which showed small sliding hiatal hernia, GERD was elicited with water siphon maneuver. Mildly prominent cricopharyngeus muscle in the pharynx.  Also with chronic microcytic anemia. Previously on iron. Recent ferritin okay. Heme negative in 08/2010.   Current Outpatient Prescriptions  Medication Sig Dispense Refill  . amoxicillin (AMOXIL) 500 MG capsule Take 1 capsule (500 mg total) by mouth 2 (two) times daily.  14 capsule  0  . EPINEPHrine (EPIPEN 2-PAK) 0.3 mg/0.3 mL DEVI Use if you experience throat tightness or shortness of breath after exposure to allergen.       Marland Kitchen ibuprofen (ADVIL,MOTRIN) 200 MG tablet Take 800 mg by mouth every 6 (six) hours as needed.        . sulfamethoxazole-trimethoprim (SEPTRA DS) 800-160 MG per tablet Take 1 tablet by mouth every 12 (twelve) hours.  6 tablet  0    Allergies as of 06/25/2011 - Review Complete 06/25/2011  Allergen Reaction Noted  . Morphine and related  09/13/2010  . Pineapple  10/02/2010    Past Medical History  Diagnosis Date  . Anxiety   . Osteoarthritis of hip   . History of migraine headaches     Typical symptoms - bright lights, unilateral (starting behind her left ear), throbbing .  Marland Kitchen Surgical menopause 08/2007    On HRT. Occuring since 01/09 following TAH and  R-salpingo-oophorectomy   . Tibial fracture 10/2008    Left - Sustained 2/2 fall down stairs. Patient is S/P closed internal fixation with Smith Nephew tibial nail locked proximal and distal  . Endometriosis     S/P TAH and salpingo-oophorectomy, right  . Chronic cough   . Gastritis     Thought due to chronic NSAID use 2/2 pain from her congenital hip abnormality  . Epigastric pain     Chronic, thought 2/2 gastritis, H.pylori neg (06/08)  . 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 With chronic OA of hip  . Vitamin D deficiency 11/2009    Vit D level 15 in 11/2009  . Closed fibular fracture 10/2008    Left - Sustained 2/2 fall down stairs (same fall as tibular fracture). S/P internal fixation.  . Hidradenitis suppurativa 05/2006  . Protrusio acetabuli     diagnosed at age 51  . Ovarian cancer 1995    Past Surgical History  Procedure Date  . Cesarean section 08/2005    Primary low transverse  . Tubal ligation 08/2005    Right tubal ligation  . Laparoscopy 10/2000    Operative laparoscopy with lysis of right adnexal adhesions and uterointestinal adhesions  . Other surgical history 02/2009    Closed treatment internal fixation, left tibia with Katrinka Blazing and Nephew tibial nail locked proximal and distal  . Total abdominal hysterectomy w/ bilateral salpingoophorectomy 08/2007    for endometriosis. With  concurrent right salpingo-oophorectomy  (2009), left oophorectomy (1995)  . Left tib fib   . Fracture surgery   . Joint replacement     right hip    Family History  Problem Relation Age of Onset  . Diabetes type II    . Arthritis    . Breast cancer Sister 39    remission  . Colon cancer Neg Hx   . Liver disease Neg Hx     History   Social History  . Marital Status: Married    Spouse Name: N/A    Number of Children: 2  . Years of Education: bs degree   Occupational History  . Stay-at-home mom    Social  History Main Topics  . Smoking status: Never Smoker   . Smokeless tobacco: Not on file  . Alcohol Use: No  . Drug Use: No  . Sexually Active: Not on file   Other Topics Concern  . Not on file   Social History Narrative   Insurance: Medicare.Patient is married with two children, [place] and [place].       ROS:  General: Negative for anorexia, fever, chills, fatigue, weakness. See HPI. Eyes: Negative for vision changes.  ENT: Negative for hoarseness, difficulty swallowing , nasal congestion. CV: Negative for chest pain, angina, palpitations, dyspnea on exertion, peripheral edema.  Respiratory: Negative for dyspnea at rest, dyspnea on exertion, cough, sputum, wheezing.  GI: See history of present illness. GU:  Negative for dysuria, hematuria, urinary incontinence, urinary frequency, nocturnal urination.  MS: Chronic intermittent joint pain, low back pain.  Derm: Negative for rash or itching.  Neuro: Negative for weakness, abnormal sensation, seizure. Frequent migraine headaches, no memory loss, no confusion.  Psych: Negative for anxiety, depression, suicidal ideation, hallucinations.  Endo: See HPI.  Heme: Negative for bruising or bleeding. Allergy: Negative for rash or hives.    Physical Examination:  BP 134/83  Pulse 65  Temp(Src) 98.4 F (36.9 C) (Temporal)  Ht 5\' 10"  (1.778 m)  Wt 175 lb 6.4 oz (79.561 kg)  BMI 25.17 kg/m2   General: Well-nourished, well-developed in no acute distress.  Head: Normocephalic, atraumatic.   Eyes: Conjunctiva pink, no icterus. Mouth: Oropharyngeal mucosa moist and pink , no lesions erythema or exudate. Neck: Supple without thyromegaly, masses, or lymphadenopathy.  Lungs: Clear to auscultation bilaterally.  Heart: Regular rate and rhythm, no murmurs rubs or gallops.  Abdomen: Bowel sounds are normal, nontender, nondistended, no hepatosplenomegaly or masses, no abdominal bruits or    hernia , no rebound or guarding.   Rectal: Not  performed. Extremities: No lower extremity edema. No clubbing or deformities.  Neuro: Alert and oriented x 4 , grossly normal neurologically.  Skin: Warm and dry, no rash or jaundice.   Psych: Alert and cooperative, normal mood and affect.  Labs: Lab Results  Component Value Date   WBC 7.0 05/15/2011   HGB 11.2* 05/15/2011   HCT 35.7* 05/15/2011   MCV 86.0 05/15/2011   PLT 338 05/15/2011   Lab Results  Component Value Date   CREATININE 0.86 09/06/2010   BUN 9 09/06/2010   NA 141 09/06/2010   K 3.5 09/06/2010   CL 103 09/06/2010   CO2 27 09/06/2010   Lab Results  Component Value Date   ALT 13 09/06/2010   AST 19 09/06/2010   ALKPHOS 68 09/06/2010   BILITOT 0.7 09/06/2010

## 2011-06-26 NOTE — ED Notes (Signed)
Urine culture and medications reviewed. Pt. adequately treated with Septra. Vassie Moselle 06/26/2011

## 2011-06-26 NOTE — Telephone Encounter (Signed)
Patient showed up and was seen.

## 2011-06-27 ENCOUNTER — Encounter: Payer: Self-pay | Admitting: Gastroenterology

## 2011-06-27 ENCOUNTER — Ambulatory Visit (HOSPITAL_COMMUNITY)
Admission: RE | Admit: 2011-06-27 | Discharge: 2011-06-27 | Disposition: A | Discharge status: Home / Self Care | Payer: Managed Care, Other (non HMO) | Source: Ambulatory Visit | Attending: Internal Medicine | Admitting: Internal Medicine

## 2011-06-27 DIAGNOSIS — D649 Anemia, unspecified: Secondary | ICD-10-CM | POA: Insufficient documentation

## 2011-06-27 DIAGNOSIS — R269 Unspecified abnormalities of gait and mobility: Secondary | ICD-10-CM | POA: Insufficient documentation

## 2011-06-27 DIAGNOSIS — I10 Essential (primary) hypertension: Secondary | ICD-10-CM | POA: Insufficient documentation

## 2011-06-27 DIAGNOSIS — IMO0001 Reserved for inherently not codable concepts without codable children: Secondary | ICD-10-CM | POA: Insufficient documentation

## 2011-06-27 DIAGNOSIS — M25559 Pain in unspecified hip: Secondary | ICD-10-CM | POA: Insufficient documentation

## 2011-06-27 NOTE — Assessment & Plan Note (Signed)
Esophageal dysphagia to solid foods. Avoiding meats altogether. Recommend EGD +/- ED.  I have discussed the risks, alternatives, benefits with regards to but not limited to the risk of reaction to medication, bleeding, infection, perforation and the patient is agreeable to proceed. Written consent to be obtained.

## 2011-06-27 NOTE — Assessment & Plan Note (Signed)
Nausea, weight loss. Chronic NSAIDS. Needs EGD for further evaluation. She also has chronic microcytic anemia, normal ferritin. S/p hysterectomy in 2009. Recommend ifobt. May ultimately need colonoscopy.

## 2011-06-27 NOTE — Progress Notes (Signed)
Physical Therapy Evaluation  Patient Details  Name: [name] MRN: [id number] Date of Birth: 1976-01-01  Today's Date: 06/27/2011 Time: 1110-1200  Charges: 1 eval; 10' TE Visit#: 1 of 8 Re-eval:      Past Medical History:  Past Medical History  Diagnosis Date  . Anxiety   . Osteoarthritis of hip   . History of migraine headaches     Typical symptoms - bright lights, unilateral (starting behind her left ear), throbbing .  Marland Kitchen Surgical menopause 08/2007    On HRT. Occuring since 01/09 following TAH and R-salpingo-oophorectomy   . Tibial fracture 10/2008    Left - Sustained 2/2 fall down stairs. Patient is S/P closed internal fixation with Smith Nephew tibial nail locked proximal and distal  . Endometriosis     S/P TAH and salpingo-oophorectomy, right  . Chronic cough   . Gastritis     Thought due to chronic NSAID use 2/2 pain from her congenital hip abnormality  . Epigastric pain     Chronic, thought 2/2 gastritis, H.pylori neg (06/08)  . 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 With chronic OA of hip  . Vitamin D deficiency 11/2009    Vit D level 15 in 11/2009  . Closed fibular fracture 10/2008    Left - Sustained 2/2 fall down stairs (same fall as tibular fracture). S/P internal fixation.  . Hidradenitis suppurativa 05/2006  . Protrusio acetabuli     diagnosed at age 35  . Ovarian cancer 1995   Past Surgical History:  Past Surgical History  Procedure Date  . Cesarean section 08/2005    Primary low transverse  . Tubal ligation 08/2005    Right tubal ligation  . Laparoscopy 10/2000    Operative laparoscopy with lysis of right adnexal adhesions and uterointestinal adhesions  . Other surgical history 02/2009    Closed treatment internal fixation, left tibia with Katrinka Blazing and Nephew tibial nail locked proximal and distal  . Total abdominal hysterectomy w/ bilateral salpingoophorectomy 08/2007   for endometriosis. With concurrent right salpingo-oophorectomy  (2009), left oophorectomy (1995)  . Left tib fib   . Fracture surgery   . Joint replacement     right hip    Subjective Symptoms/Limitations Symptoms: Pt reports that she had bilateral hip pain for years and all of a sudden the pain has intensified for about a year.  She has had a few injections over a year and did not help with the pain.  Other then that she has not tried any other conservative treatment.  She has had this pain for over 20 years and first noticed when she was trying out for volleyball. Juvielle Arthritis that has led to shallow acetabulum.  How long can you sit comfortably?: 30 minutes with increased stiffness How long can you stand comfortably?: 10-15 minutes  How long can you walk comfortably?: 10-15 minutes  Pain Assessment Currently in Pain?: Yes Pain Score:   9 Pain Location: Hip Pain Orientation: Right;Left Pain Type: Chronic pain   06/27/11 1200  Assessment  Diagnosis B hip OA w/acetabulum intrapelvic protrusion  Precautions  Precautions Anterior Hip  Home Living  Lives With Family;Son;Daughter  Additional Comments Pt requires assistance with her functional activities secondary to pain.  Prior Function  Able to Take Stairs? No  Vocation Requirements Currently not working secondary to pain   Comments Used to enjoy exercise.  Enjoys being with her 2 children  RLE AROM (degrees)  Right Hip Extension 0-30 5   Right Hip Flexion 0-125 80   RLE Strength  Right Hip Flexion 2+/5  Right Hip Extension 3/5  Right Hip ABduction 2-/5  Right Hip ADduction 2-/5  Right Knee Flexion 3/5  Right Knee Extension 3/5  LLE AROM (degrees)  Left Hip Extension 0-30 5   Left Hip Flexion 0-125 80   LLE Strength  Left Hip Flexion 2/5  Left Hip Extension 3-/5  Left Hip ABduction 2/5  Left Hip ADduction 2/5  Left Knee Flexion 3/5  Left Knee Extension 3/5  Lumbar Strength  Lumbar Flexion 2-/5  Lumbar  Extension 2-/5  Ambulation/Gait  Ambulation/Gait Yes  Gait Pattern Trendelenburg;Antalgic  Posture/Postural Control  Posture/Postural Control Postural limitations  Postural Limitations impaired posture/slouched posture  Balance  Balance Assessed Yes  Static Standing Balance  Static Standing - Comment/# of Minutes Impaired balance with standing activities. with impaired ankle and hip strategy      Exercise/Treatments  06/27/11 1148  Knee Exercises: Supine  Short Arc Quad Sets Both;10 reps  Hip Adduction Isometric 5 reps;Both  Hip Adduction Isometric Limitations Hip Adduction Isometrics 5x10 sec  Bridges 10 reps  Straight Leg Raises Both;10 reps  Other Supine Knee Exercises TA contraction with bent knee raise x10 BLE        Physical Therapy Assessment and Plan      Goals    Problem List Patient Active Problem List  Diagnoses  . VITAMIN D DEFICIENCY  . ANXIETY  . MIGRAINE HEADACHE  . MENOPAUSE, SURGICAL  . OSTEOARTHRITIS, HIP  . Hypertension  . Open fracture of upper end of fibula with tibia  . Closed fracture of shaft of tibia  . BACK PAIN  . Preventative health care  . Acetabulum intrapelvic protrusion into pelvic region or thigh  . Strep pharyngitis  . Esophageal dysphagia  . Nausea  . Anemia        Ajdin Macke 06/27/2011, 6:54 PM  Physician Documentation Your signature is required to indicate approval of the treatment plan as stated above.  Please sign and either send electronically or make a copy of this report for your files and return this physician signed original.   Please mark one 1.__approve of plan  2. ___approve of plan with the following conditions.   ______________________________                                                          _____________________ Physician Signature                                                                                                             Date

## 2011-06-30 NOTE — Progress Notes (Signed)
LMOM to call. ( [facility] at front for pick up).  

## 2011-06-30 NOTE — Progress Notes (Signed)
Cc to PCP 

## 2011-07-02 NOTE — Progress Notes (Signed)
Please let pt know. [facility] recommends also doing a TCS for h/o IDA/weight loss. EGD for dysphagia. If patient in agreement, please add on TCS. Schedule TCS/EGD/?DIL. Patient should still return ifobt.

## 2011-07-02 NOTE — Progress Notes (Signed)
TCS/EGD FOR ANEMIA/weight loss. AGREE w/ [facility].  REVIEWED.

## 2011-07-02 NOTE — Progress Notes (Signed)
CORRECTION: TCS/EGD/?DIL FOR ANEMIA/WEIGHT LOSS/DYSPHAGIA

## 2011-07-02 NOTE — Progress Notes (Signed)
Pt informed the [facility] at front for pick up.

## 2011-07-08 DIAGNOSIS — M25559 Pain in unspecified hip: Secondary | ICD-10-CM | POA: Insufficient documentation

## 2011-07-08 NOTE — Progress Notes (Signed)
LMOM to call.

## 2011-07-09 NOTE — Progress Notes (Signed)
LMOM to call.

## 2011-07-10 ENCOUNTER — Ambulatory Visit (HOSPITAL_COMMUNITY): Payer: Managed Care, Other (non HMO) | Admitting: Physical Therapy

## 2011-07-11 ENCOUNTER — Ambulatory Visit (HOSPITAL_COMMUNITY)
Admission: RE | Admit: 2011-07-11 | Discharge: 2011-07-11 | Disposition: A | Discharge status: Home / Self Care | Payer: Managed Care, Other (non HMO) | Source: Ambulatory Visit | Attending: Internal Medicine | Admitting: Internal Medicine

## 2011-07-11 MED ORDER — SODIUM CHLORIDE 0.45 % IV SOLN: Freq: Once | INTRAVENOUS | Status: DC

## 2011-07-11 NOTE — Progress Notes (Signed)
Physical Therapy Treatment Patient Details  Name: KENLYN LOSE MRN: [id number] Date of Birth: Nov 17, 1975  Today's Date: 07/11/2011 Time: 4098-1191 Time Calculation (min): 43 min Charges: 46' TE Visit#: 2  of 8   Re-eval: 07/27/11    Subjective: Symptoms/Limitations Symptoms: Pt comes in today with complaints of R proximal/medial gastroc head pain that has lasted about 1 week.  She reports that she had to D/C some of her exercises because of the pain.  Objective: palpated gastroc, had pain with active gastroc contraction and tenderness with palpation. Pain Assessment Currently in Pain?: Yes Pain Score:   8 Pain Location: Hip Pain Orientation: Left  Exercise/Treatments Stability Clam: Supine;5 reps;Limitations Clam Limitations: BLE w/ab set Bridge: 10 reps Bent Knee Raise: 10 reps Ab Set: Supine;10 reps;5 seconds Isometric Hip Flexion: Supine;5 reps;Limitations Isometric Hip Flexion Limitations: manual resistance from therapist  Supine Quad Sets: Right;Left;3 sets;Limitations Quad Sets Limitations: 5 reps  Short Arc Quad Sets: Right;Left;15 reps;Limitations Short Arc Quad Sets Limitations: 1# Heel Slides: Right;Left;10 reps;Limitations Heel Slides Limitations: LLE with hip flexed and foot in the air pushing and pulling against therapist Bridges: 10 reps Straight Leg Raises: Both;10 reps;Limitations Straight Leg Raises Limitations: AAROM for LLE Prone  Hamstring Curl: 10 reps;Limitations Hamstring Curl Limitations: BLE Hip Extension: AAROM;Both;10 reps    Physical Therapy Assessment and Plan PT Assessment and Plan Clinical Impression Statement: Pt continues to demonstrate increased muscular fatigue and has significant weakness to BLE and core.  Incorporated core activities with LE strengthening in order to improve hip strength and mobility.  PT Plan: Address pain after today's activities.  Continue with general core and low level exercises.      Goals     Problem List Patient Active Problem List  Diagnoses  . VITAMIN D DEFICIENCY  . ANXIETY  . MIGRAINE HEADACHE  . MENOPAUSE, SURGICAL  . OSTEOARTHRITIS, HIP  . Hypertension  . Open fracture of upper end of fibula with tibia  . Closed fracture of shaft of tibia  . BACK PAIN  . Preventative health care  . Acetabulum intrapelvic protrusion into pelvic region or thigh  . Strep pharyngitis  . Esophageal dysphagia  . Nausea  . Anemia  . Hip pain    PT - End of Session Activity Tolerance: Patient tolerated treatment well  Danitra Payano 07/11/2011, 4:30 PM

## 2011-07-14 ENCOUNTER — Other Ambulatory Visit: Payer: Self-pay | Admitting: Gastroenterology

## 2011-07-14 ENCOUNTER — Encounter (HOSPITAL_COMMUNITY)
Admission: RE | Disposition: A | Discharge status: Home / Self Care | Payer: Self-pay | Source: Ambulatory Visit | Attending: Gastroenterology

## 2011-07-14 ENCOUNTER — Ambulatory Visit (HOSPITAL_COMMUNITY)
Admission: RE | Admit: 2011-07-14 | Discharge: 2011-07-14 | Disposition: A | Discharge status: Home / Self Care | Payer: Managed Care, Other (non HMO) | Source: Ambulatory Visit | Attending: Gastroenterology | Admitting: Gastroenterology

## 2011-07-14 ENCOUNTER — Ambulatory Visit (HOSPITAL_COMMUNITY): Payer: Managed Care, Other (non HMO)

## 2011-07-14 ENCOUNTER — Encounter (HOSPITAL_COMMUNITY): Payer: Self-pay | Admitting: *Deleted

## 2011-07-14 DIAGNOSIS — K2970 Gastritis, unspecified, without bleeding: Secondary | ICD-10-CM

## 2011-07-14 DIAGNOSIS — K2940 Chronic atrophic gastritis without bleeding: Secondary | ICD-10-CM | POA: Insufficient documentation

## 2011-07-14 DIAGNOSIS — K222 Esophageal obstruction: Secondary | ICD-10-CM

## 2011-07-14 DIAGNOSIS — K2980 Duodenitis without bleeding: Secondary | ICD-10-CM | POA: Insufficient documentation

## 2011-07-14 DIAGNOSIS — R1319 Other dysphagia: Secondary | ICD-10-CM

## 2011-07-14 DIAGNOSIS — R1310 Dysphagia, unspecified: Secondary | ICD-10-CM | POA: Insufficient documentation

## 2011-07-14 DIAGNOSIS — D509 Iron deficiency anemia, unspecified: Secondary | ICD-10-CM | POA: Insufficient documentation

## 2011-07-14 DIAGNOSIS — K2990 Gastroduodenitis, unspecified, without bleeding: Secondary | ICD-10-CM

## 2011-07-14 DIAGNOSIS — R110 Nausea: Secondary | ICD-10-CM | POA: Insufficient documentation

## 2011-07-14 DIAGNOSIS — K449 Diaphragmatic hernia without obstruction or gangrene: Secondary | ICD-10-CM | POA: Insufficient documentation

## 2011-07-14 DIAGNOSIS — D649 Anemia, unspecified: Secondary | ICD-10-CM

## 2011-07-14 HISTORY — DX: Headache: R51

## 2011-07-14 SURGERY — ESOPHAGOGASTRODUODENOSCOPY (EGD) WITH ESOPHAGEAL DILATION
Anesthesia: Moderate Sedation

## 2011-07-14 MED ORDER — PROMETHAZINE HCL 25 MG/ML IJ SOLN
INTRAMUSCULAR | Status: DC | PRN
  Administered 2011-07-14: 12.5 mg via INTRAVENOUS

## 2011-07-14 MED ORDER — MEPERIDINE HCL 100 MG/ML IJ SOLN
INTRAMUSCULAR | Status: AC
  Filled 2011-07-14: qty 2

## 2011-07-14 MED ORDER — MIDAZOLAM HCL 5 MG/5ML IJ SOLN
INTRAMUSCULAR | Status: DC | PRN
  Administered 2011-07-14: 2 mg via INTRAVENOUS
  Administered 2011-07-14: 1 mg via INTRAVENOUS

## 2011-07-14 MED ORDER — STERILE WATER FOR IRRIGATION IR SOLN
Status: DC | PRN
  Administered 2011-07-14: 11:00:00

## 2011-07-14 MED ORDER — MEPERIDINE HCL 100 MG/ML IJ SOLN
INTRAMUSCULAR | Status: DC | PRN
  Administered 2011-07-14: 25 mg via INTRAVENOUS
  Administered 2011-07-14: 50 mg via INTRAVENOUS

## 2011-07-14 MED ORDER — MINERAL OIL PO OIL
TOPICAL_OIL | ORAL | Status: AC
  Filled 2011-07-14: qty 30

## 2011-07-14 MED ORDER — OMEPRAZOLE 20 MG PO CPDR: DELAYED_RELEASE_CAPSULE | ORAL | Status: DC

## 2011-07-14 MED ORDER — MIDAZOLAM HCL 5 MG/5ML IJ SOLN
INTRAMUSCULAR | Status: AC
  Filled 2011-07-14: qty 10

## 2011-07-14 MED ORDER — PROMETHAZINE HCL 25 MG/ML IJ SOLN
INTRAMUSCULAR | Status: AC
  Filled 2011-07-14: qty 1

## 2011-07-14 NOTE — Progress Notes (Signed)
Pt never returned my call. Had appt at the hospital today.

## 2011-07-14 NOTE — H&P (Signed)
285.9      Reason for Visit     Dysphagia        Vitals - Last Recorded       BP Pulse Temp(Src) Ht Wt BMI    134/83  65  98.4 F (36.9 C) (Temporal)  5\' 10"  (1.778 m)  175 lb 6.4 oz (79.561 kg)  25.17 kg/m2       Progress Notes     Tana Coast, PA  06/27/2011  4:19 PM  Signed Primary Care Physician:  Suszanne Finch, DO   Primary Gastroenterologist: Jonette Eva, MD      Chief Complaint   Patient presents with   .  Dysphagia      HPI:  [name] is a 35 y.o. female here for further evaluation of several month h/o difficulty swallowing. Avoiding meats all together. Can eat soft foods and soups. Sometimes foods stick and tries to come up. Difficulty resting at night, feels like breathing cut off. No heartburn, odynophagia. Some lower abdominal pain. Dull ache, intermittent and sometimes lingers. Not related to foods or BMs. BM regular, melena, brbpr. 20 lb weight loss over one year per PCP note. Recently had barium esophagram which showed small sliding hiatal hernia, GERD was elicited with water siphon maneuver. Mildly prominent cricopharyngeus muscle in the pharynx.   Also with chronic microcytic anemia. Previously on iron. Recent ferritin okay. Heme negative in 08/2010.     Current Outpatient Prescriptions   Medication  Sig  Dispense  Refill   .  amoxicillin (AMOXIL) 500 MG capsule  Take 1 capsule (500 mg total) by mouth 2 (two) times daily.   14 capsule   0   .  EPINEPHrine (EPIPEN 2-PAK) 0.3 mg/0.3 mL DEVI  Use if you experience throat tightness or shortness of breath after exposure to allergen.          Marland Kitchen  ibuprofen (ADVIL,MOTRIN) 200 MG tablet  Take 800 mg by mouth every 6 (six) hours as needed.           .  sulfamethoxazole-trimethoprim (SEPTRA DS) 800-160 MG per tablet  Take 1 tablet by mouth every 12 (twelve) hours.   6 tablet   0       Allergies as of 06/25/2011 - Review Complete 06/25/2011   Allergen  Reaction  Noted   .  Morphine and related     09/13/2010   .  Pineapple    10/02/2010       Past Medical History   Diagnosis  Date   .  Anxiety     .  Osteoarthritis of hip     .  History of migraine headaches         Typical symptoms - bright lights, unilateral (starting behind her left ear), throbbing .   Marland Kitchen  Surgical menopause  08/2007       On HRT. Occuring since 01/09 following TAH and R-salpingo-oophorectomy    .  Tibial fracture  10/2008       Left - Sustained 2/2 fall down stairs. Patient is S/P closed internal fixation with Smith Nephew tibial nail locked proximal and distal   .  Endometriosis         S/P TAH and salpingo-oophorectomy, right   .  Chronic cough     .  Gastritis         Thought due to chronic NSAID use 2/2 pain from her congenital hip abnormality   .  Epigastric pain  Chronic, thought 2/2 gastritis, H.pylori neg (06/08)   .  Hip deformity, congenital         Protrusio acetabuli with articular sclerosis and flattening of femoral heads. With chronic OA of hip   .  Vitamin D deficiency  11/2009       Vit D level 15 in 11/2009   .  Closed fibular fracture  10/2008       Left - Sustained 2/2 fall down stairs (same fall as tibular fracture). S/P internal fixation.   .  Hidradenitis suppurativa  05/2006   .  Protrusio acetabuli         diagnosed at age 27   .  Ovarian cancer  1995       Past Surgical History   Procedure  Date   .  Cesarean section  08/2005       Primary low transverse   .  Tubal ligation  08/2005       Right tubal ligation   .  Laparoscopy  10/2000       Operative laparoscopy with lysis of right adnexal adhesions and uterointestinal adhesions   .  Other surgical history  02/2009       Closed treatment internal fixation, left tibia with Katrinka Blazing and Nephew tibial nail locked proximal and distal   .  Total abdominal hysterectomy w/ bilateral salpingoophorectomy  08/2007       for endometriosis. With concurrent right salpingo-oophorectomy  (2009), left oophorectomy (1995)   .   Left tib fib     .  Fracture surgery     .  Joint replacement         right hip       Family History   Problem  Relation  Age of Onset   .  Diabetes type II       .  Arthritis       .  Breast cancer  Sister  63       remission   .  Colon cancer  Neg Hx     .  Liver disease  Neg Hx         History       Social History   .  Marital Status:  Married       Spouse Name:  N/A       Number of Children:  2   .  Years of Education:  bs degree       Occupational History   .  Stay-at-home mom         Social History Main Topics   .  Smoking status:  Never Smoker    .  Smokeless tobacco:  Not on file   .  Alcohol Use:  No   .  Drug Use:  No   .  Sexually Active:  Not on file       Other Topics  Concern   .  Not on file       Social History Narrative     Insurance: Medicare.Patient is married with two children, [place] and [place].         ROS:   General: Negative for anorexia, fever, chills, fatigue, weakness. See HPI. Eyes: Negative for vision changes.   ENT: Negative for hoarseness, difficulty swallowing , nasal congestion. CV: Negative for chest pain, angina, palpitations, dyspnea on exertion, peripheral edema.   Respiratory: Negative for dyspnea at rest, dyspnea on exertion, cough, sputum, wheezing.   GI: See history of present  illness. GU:  Negative for dysuria, hematuria, urinary incontinence, urinary frequency, nocturnal urination.   MS: Chronic intermittent joint pain, low back pain.   Derm: Negative for rash or itching.   Neuro: Negative for weakness, abnormal sensation, seizure. Frequent migraine headaches, no memory loss, no confusion.   Psych: Negative for anxiety, depression, suicidal ideation, hallucinations.   Endo: See HPI.   Heme: Negative for bruising or bleeding. Allergy: Negative for rash or hives.     Physical Examination:   BP 134/83  Pulse 65  Temp(Src) 98.4 F (36.9 C) (Temporal)  Ht 5\' 10"  (1.778 m)  Wt 175 lb 6.4 oz (79.561 kg)   BMI 25.17 kg/m2    General: Well-nourished, well-developed in no acute distress.   Head: Normocephalic, atraumatic.    Eyes: Conjunctiva pink, no icterus. Mouth: Oropharyngeal mucosa moist and pink , no lesions erythema or exudate. Neck: Supple without thyromegaly, masses, or lymphadenopathy.   Lungs: Clear to auscultation bilaterally.   Heart: Regular rate and rhythm, no murmurs rubs or gallops.   Abdomen: Bowel sounds are normal, nontender, nondistended, no hepatosplenomegaly or masses, no abdominal bruits or    hernia , no rebound or guarding.    Rectal: Not performed. Extremities: No lower extremity edema. No clubbing or deformities.   Neuro: Alert and oriented x 4 , grossly normal neurologically.   Skin: Warm and dry, no rash or jaundice.    Psych: Alert and cooperative, normal mood and affect.   Labs: Lab Results   Component  Value  Date     WBC  7.0  05/15/2011     HGB  11.2*  05/15/2011     HCT  35.7*  05/15/2011     MCV  86.0  05/15/2011     PLT  338  05/15/2011    Lab Results   Component  Value  Date     CREATININE  0.86  09/06/2010     BUN  9  09/06/2010     NA  141  09/06/2010     K  3.5  09/06/2010     CL  103  09/06/2010     CO2  27  09/06/2010    Lab Results   Component  Value  Date     ALT  13  09/06/2010     AST  19  09/06/2010     ALKPHOS  68  09/06/2010     BILITOT  0.7  09/06/2010                  Cloria Spring, LPN, LPN  16/05/9603  1:32 PM  Signed LMOM to call. iFOBT at front for pick up.    Glendora Score  06/30/2011  4:10 PM  Signed Cc to PCP  Cloria Spring, LPN, LPN  54/04/8118  8:06 AM  Signed Pt informed the iFOBT at front for pick up.    Jonette Eva, MD  07/02/2011 10:58 AM  Signed TCS/EGD FOR ANEMIA/weight loss. AGREE w/ iFOBT.   REVIEWED.     Jonette Eva, MD  07/02/2011 10:59 AM  Signed CORRECTION: TCS/EGD/?DIL FOR ANEMIA/WEIGHT LOSS/DYSPHAGIA  Tana Coast, PA  07/02/2011 12:01 PM  Signed Please let pt know. [facility] recommends  also doing a TCS for h/o IDA/weight loss. EGD for dysphagia. If patient in agreement, please add on TCS. Schedule TCS/EGD/?DIL. Patient should still return ifobt.  Cloria Spring, LPN, LPN  14/78/2956 11:03 AM  Signed LMOM to call.    Cloria Spring, LPN, LPN  07/09/2011 12:07 PM  Signed LMOM to call.       Esophageal dysphagia - Tana Coast, PA  06/27/2011  4:15 PM  Signed Esophageal dysphagia to solid foods. Avoiding meats altogether. Recommend EGD +/- ED.  I have discussed the risks, alternatives, benefits with regards to but not limited to the risk of reaction to medication, bleeding, infection, perforation and the patient is agreeable to proceed. Written consent to be obtained.     Nausea - Tana Coast, PA  06/27/2011  4:17 PM  Signed Nausea, weight loss. Chronic NSAIDS. Needs EGD for further evaluation. She also has chronic microcytic anemia, normal ferritin. S/p hysterectomy in 2009. Recommend ifobt. May ultimately need colonoscopy.

## 2011-07-14 NOTE — Interval H&P Note (Signed)
History and Physical Interval Note:  07/14/2011 9:52 AM  [name]  has presented today for surgery, with the diagnosis of Dysphagia & nausea  The various methods of treatment have been discussed with the patient and family. After consideration of risks, benefits and other options for treatment, the patient has consented to  Procedure(s): ESOPHAGOGASTRODUODENOSCOPY (EGD) WITH ESOPHAGEAL DILATION as a surgical intervention .  The patients' history has been reviewed, patient examined, no change in status, stable for surgery.  I have reviewed the patients' chart and labs.  Questions were answered to the patient's satisfaction.     [employer]

## 2011-07-15 ENCOUNTER — Telehealth: Payer: Self-pay | Admitting: Gastroenterology

## 2011-07-15 NOTE — Telephone Encounter (Signed)
LMOM for pt to call back and schedule.

## 2011-07-15 NOTE — Telephone Encounter (Signed)
Message copied by Irish Elders on Tue Jul 15, 2011  8:52 AM ------      Message from: [place]      Created: Mon Jul 14, 2011 11:37 AM       TCS WITHIN THE NEXT 2-3 WEEKS, dX: ANEMIA. MOVI PREP SPLIT DOSING: DAY BEFORE-REGULAR BREAKFAST. CLEAR LIQUIDS AFTER 9 AM.

## 2011-07-16 ENCOUNTER — Telehealth: Payer: Self-pay | Admitting: Gastroenterology

## 2011-07-16 NOTE — Telephone Encounter (Signed)
Pt came by to pick up her stool samples and while she was here, I talked to her about scheduling her TCS that [facility] would like for her to have- pt was unsure why she needed it and would like to come back in the office to discuss this- she would like to do this after Christmas and I scheduled her for an OV on 01/09-13

## 2011-07-18 ENCOUNTER — Telehealth: Payer: Self-pay | Admitting: Gastroenterology

## 2011-07-18 ENCOUNTER — Ambulatory Visit (HOSPITAL_COMMUNITY): Payer: Managed Care, Other (non HMO) | Admitting: Physical Therapy

## 2011-07-18 NOTE — Telephone Encounter (Signed)
Tried to call but no answer

## 2011-07-18 NOTE — Telephone Encounter (Signed)
Results Cc to PCP  

## 2011-07-18 NOTE — Telephone Encounter (Signed)
Please call pt. HER stomach Bx shows mild gastritis. Continue OMP DAILY 30 MINUTES PRIOR TO YOUR MEAL. SHE NEEDS TO SCHEDULE A TCS IN DEC 2012 TO COMPLETE HER WORKUP FOR ANEMIA. OPV IN [facility] 2013.

## 2011-07-20 ENCOUNTER — Emergency Department: Payer: Self-pay | Admitting: Emergency Medicine

## 2011-07-21 ENCOUNTER — Ambulatory Visit (HOSPITAL_COMMUNITY): Payer: Managed Care, Other (non HMO) | Admitting: Physical Therapy

## 2011-07-21 ENCOUNTER — Telehealth: Payer: Self-pay | Admitting: Gastroenterology

## 2011-07-21 ENCOUNTER — Telehealth (HOSPITAL_COMMUNITY): Payer: Self-pay

## 2011-07-21 NOTE — Telephone Encounter (Signed)
I called pt. She said she went to the ED yesterday. She had some fever 100.9 and chills and the abdominal pain and back pain. She said the ED said she might have flu. But said she needed to folllow up with Dr. Darrick Penna since hse had just had the procedure. Please advise!

## 2011-07-21 NOTE — Telephone Encounter (Signed)
Pt informed

## 2011-07-21 NOTE — Telephone Encounter (Signed)
Please call pt. She had an upper endoscopy 7 days ago. Her Sx should not be due to her procedure.

## 2011-07-21 NOTE — Telephone Encounter (Signed)
Dr. Darrick Penna did a Procedure on the patient 07/14/11 and now shes having lower back & abdominal pain and [name] can barely move. Patient is asking if this is related to her procedure please advise?

## 2011-07-22 ENCOUNTER — Ambulatory Visit (INDEPENDENT_AMBULATORY_CARE_PROVIDER_SITE_OTHER): Payer: Managed Care, Other (non HMO) | Admitting: Internal Medicine

## 2011-07-22 ENCOUNTER — Telehealth: Payer: Self-pay | Admitting: *Deleted

## 2011-07-22 ENCOUNTER — Encounter: Payer: Self-pay | Admitting: Internal Medicine

## 2011-07-22 VITALS — BP 144/86 | HR 76 | Temp 96.9°F | Ht 70.5 in | Wt 175.0 lb

## 2011-07-22 DIAGNOSIS — R300 Dysuria: Secondary | ICD-10-CM

## 2011-07-22 NOTE — Progress Notes (Signed)
Subjective:   Patient ID: [name] female   DOB: 1975/12/01 35 y.o.   MRN: [id number]  HPI: [name] is a 35 y.o.  Woman is here for a follow up from urgent care for "walking pneumonia" on 07/20/11. Was started on Z-pack. Reports feeling "better." However, she states that feels dehydrated because does not drink as much as usual. Reports a dysuria of 4 day duration. Denies any bladder incontinence, fever, chills, hematuria, malodor, dyspareunia, discharge or LBP. Patient has had similar Sx in the past and states that "feels like a UTI."    Past Medical History  Diagnosis Date  . Anxiety   . Osteoarthritis of hip   . History of migraine headaches     Typical symptoms - bright lights, unilateral (starting behind her left ear), throbbing .  Marland Kitchen Surgical menopause 08/2007    On HRT. Occuring since 01/09 following TAH and R-salpingo-oophorectomy   . Tibial fracture 10/2008    Left - Sustained 2/2 fall down stairs. Patient is S/P closed internal fixation with Smith Nephew tibial nail locked proximal and distal  . Endometriosis     S/P TAH and salpingo-oophorectomy, right  . Chronic cough   . Gastritis     Thought due to chronic NSAID use 2/2 pain from her congenital hip abnormality  . Epigastric pain     Chronic, thought 2/2 gastritis, H.pylori neg (06/08)  . 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 With chronic OA of hip  . Vitamin D deficiency 11/2009    Vit D level 15 in 11/2009  . Closed fibular fracture 10/2008    Left - Sustained 2/2 fall down stairs (same fall as tibular fracture). S/P internal fixation.  . Hidradenitis suppurativa 05/2006  . Protrusio acetabuli     diagnosed at age 65  . Ovarian cancer 1995  . Headache     Migraines   Current Outpatient Prescriptions  Medication Sig Dispense Refill  . azithromycin (ZITHROMAX) 250 MG tablet Take 250 mg by mouth 1 day or 1 dose.       .  ibuprofen (ADVIL,MOTRIN) 200 MG tablet Take 800 mg by mouth every 6 (six) hours as needed.        Marland Kitchen omeprazole (PRILOSEC) 20 MG capsule 1 po every morning  30 capsule  5  . EPINEPHrine (EPIPEN 2-PAK) 0.3 mg/0.3 mL DEVI Use if you experience throat tightness or shortness of breath after exposure to allergen.        Family History  Problem Relation Age of Onset  . Diabetes type II    . Arthritis    . Breast cancer Sister 48    remission  . Colon cancer Neg Hx   . Liver disease Neg Hx    History   Social History  . Marital Status: Married    Spouse Name: N/A    Number of Children: 2  . Years of Education: bs degree   Occupational History  . Stay-at-home mom    Social History Main Topics  . Smoking status: Never Smoker   . Smokeless tobacco: None  . Alcohol Use: No  . Drug Use: No  . Sexually Active: None   Other Topics Concern  . None   Social History Narrative   Insurance: Medicare.Patient is married with two children, [place] and [place].    Review of Systems: Constitutional: Denies fever, chills, diaphoresis, appetite change and fatigue.  HEENT: Denies photophobia, eye  pain, redness, hearing loss, ear pain, congestion, sore throat, rhinorrhea, sneezing, mouth sores, trouble swallowing, neck pain, neck stiffness and tinnitus.   Respiratory: Denies SOB, DOE, cough, chest tightness,  and wheezing.   Cardiovascular: Denies chest pain, palpitations and leg swelling.  Gastrointestinal: Denies nausea, vomiting, abdominal pain, diarrhea, constipation, blood in stool and abdominal distention.  Genitourinary: Denies dysuria, urgency, frequency, hematuria, flank pain and difficulty urinating.  Musculoskeletal: Denies myalgias, back pain, joint swelling, arthralgias and gait problem.  Skin: Denies pallor, rash and wound.  Neurological: Denies dizziness, seizures, syncope, weakness, light-headedness, numbness and headaches.  Hematological: Denies adenopathy. Easy bruising, personal  or family bleeding history  Psychiatric/Behavioral: Denies suicidal ideation, mood changes, confusion, nervousness, sleep disturbance and agitation  Objective:  Physical Exam: Filed Vitals:   07/22/11 1549  BP: 144/86  Pulse: 76  Temp: 96.9 F (36.1 C)  TempSrc: Oral  Height: 5' 10.5" (1.791 m)  Weight: 175 lb (79.379 kg)  SpO2: 99%   Constitutional: Vital signs reviewed.  Patient is a well-developed and well-nourished woman in no acute distress and cooperative with exam. Alert and oriented x3.  Head: Normocephalic and atraumatic Ear: TM normal bilaterally Mouth: no erythema or exudates, MMM Eyes: PERRL, EOMI, conjunctivae normal, No scleral icterus.  Neck: Supple, Trachea midline normal ROM, No JVD, mass, thyromegaly, or carotid bruit present.  Cardiovascular: RRR, S1 normal, S2 normal, no MRG, pulses symmetric and intact bilaterally Pulmonary/Chest: CTAB, no wheezes, rales, or rhonchi Abdominal: Soft. Non-tender, non-distended, bowel sounds are normal, no masses, organomegaly, or guarding present.  GU: no CVA tenderness Musculoskeletal: No joint deformities, erythema, or stiffness, ROM full and no nontender Hematology: no cervical, inginal, or axillary adenopathy.  Neurological: A&O x3, Strenght is normal and symmetric bilaterally, cranial nerve II-XII are grossly intact, no focal motor deficit, sensory intact to light touch bilaterally.  Skin: Warm, dry and intact. No rash, cyanosis, or clubbing.  Psychiatric: Normal mood and affect. speech and behavior is normal. Judgment and thought content normal. Cognition and memory are normal.   Assessment & Plan:    1. Atypical PNA -patient is on Z-pack Day #3. -Reports feeling better; denies any cough, chills, fever, or rash. -patient is instructed to complete her Abx regimen as instructed.  2. Dysuria-> UTI? -patient is unable to produce urine "because feels dehydrated." -given water in the office and will await until able to  collect urine specimen for dipstick and C&S. -pelvic rest for 1 week -HYDRATION!!! -Pyridium OTC -instructed to call the office if no improvement of Sx within 3-5 days.  3. Dysphagia  Due to esophageal stricture -resolved  Sp dilatation via EGD  4. Anemia, normocytic with Hgb at 11.2 -patient is scheduled for a colonoscopy.

## 2011-07-22 NOTE — Progress Notes (Deleted)
  Subjective:    Patient ID: [name], female    DOB: [birth year], 35 y.o.   MRN: [id number]  HPI    Review of Systems     Objective:   Physical Exam        Assessment & Plan:

## 2011-07-22 NOTE — Telephone Encounter (Signed)
Pt seen [facility] about 2-3 weeks ago for UTI.  She was treated and told to f/u. She did not f/u but since Saturday she is having back pain, frequency and dribbling  Will see today.

## 2011-07-22 NOTE — Patient Instructions (Signed)
Please, drink plenty of fluids and continue with Azythromycin as were prescribed. Please, call in 3-5 days if not feeling better or worse.

## 2011-07-22 NOTE — Telephone Encounter (Signed)
Pt is aware of OV on 1/9 at 0930 with SF and reminder in epic to follow up in March 2013

## 2011-07-24 ENCOUNTER — Encounter: Payer: Self-pay | Admitting: Internal Medicine

## 2011-07-25 ENCOUNTER — Ambulatory Visit (HOSPITAL_COMMUNITY): Payer: Managed Care, Other (non HMO) | Admitting: Physical Therapy

## 2011-07-28 ENCOUNTER — Ambulatory Visit (HOSPITAL_COMMUNITY): Payer: Managed Care, Other (non HMO) | Admitting: Physical Therapy

## 2011-07-28 ENCOUNTER — Telehealth (HOSPITAL_COMMUNITY): Payer: Self-pay

## 2011-08-06 ENCOUNTER — Ambulatory Visit (HOSPITAL_COMMUNITY): Payer: Managed Care, Other (non HMO)

## 2011-08-08 ENCOUNTER — Ambulatory Visit (HOSPITAL_COMMUNITY): Payer: Managed Care, Other (non HMO)

## 2011-08-11 ENCOUNTER — Ambulatory Visit (HOSPITAL_COMMUNITY): Payer: Managed Care, Other (non HMO) | Admitting: Physical Therapy

## 2011-08-20 ENCOUNTER — Telehealth: Payer: Self-pay | Admitting: Gastroenterology

## 2011-08-20 ENCOUNTER — Ambulatory Visit: Payer: Managed Care, Other (non HMO) | Admitting: Gastroenterology

## 2011-08-20 NOTE — Telephone Encounter (Signed)
REVIEWED.  

## 2011-08-20 NOTE — Telephone Encounter (Signed)
Pt was a no show

## 2011-09-04 ENCOUNTER — Ambulatory Visit (INDEPENDENT_AMBULATORY_CARE_PROVIDER_SITE_OTHER): Payer: Managed Care, Other (non HMO) | Admitting: Gastroenterology

## 2011-09-04 ENCOUNTER — Encounter: Payer: Self-pay | Admitting: Gastroenterology

## 2011-09-04 DIAGNOSIS — R1012 Left upper quadrant pain: Secondary | ICD-10-CM

## 2011-09-04 DIAGNOSIS — R1011 Right upper quadrant pain: Secondary | ICD-10-CM | POA: Insufficient documentation

## 2011-09-04 DIAGNOSIS — D649 Anemia, unspecified: Secondary | ICD-10-CM

## 2011-09-04 DIAGNOSIS — R1314 Dysphagia, pharyngoesophageal phase: Secondary | ICD-10-CM

## 2011-09-04 DIAGNOSIS — R1032 Left lower quadrant pain: Secondary | ICD-10-CM

## 2011-09-04 DIAGNOSIS — R1319 Other dysphagia: Secondary | ICD-10-CM

## 2011-09-04 DIAGNOSIS — R109 Unspecified abdominal pain: Secondary | ICD-10-CM

## 2011-09-04 DIAGNOSIS — R1310 Dysphagia, unspecified: Secondary | ICD-10-CM

## 2011-09-04 MED ORDER — OMEPRAZOLE 20 MG PO CPDR: DELAYED_RELEASE_CAPSULE | ORAL | Status: DC

## 2011-09-04 MED ORDER — SOD PICOSULFATE-MAG OX-CIT ACD 10-3.5-12 MG-GM-GM PO PACK: 1.0000 | PACK | Freq: Once | ORAL | Status: DC

## 2011-09-04 NOTE — Progress Notes (Signed)
Faxed to PCP

## 2011-09-04 NOTE — Patient Instructions (Addendum)
Start PRILOSEC EVERY MORNING. YOUR PRESCRIPTION WAS SENT TO [facility]. MINIMIZE YOU USE OF IBUPROFEN AND BC POWDERS. SEE YOU PRIMARY DOC FOR IMITREX REFILLS. STOP TAKING FIBER SUPPLEMENTS UNTIL YOUR PAIN SUBSIDES. UPPER ENDOSCOPY WITH DILATION AND COLONOSCOPY ON FEB 5. ADD A PROBIOTIC DAILY (ALIGN OR [facility] BRAND). FOLLOW A FULL LIQUID DIET FOR THE NEXT 3 DAYS. SEE INFO BELOW. FOLLOW UP IN .  Irritable Bowel Syndrome (Spastic Colon) Irritable Bowel Syndrome (IBS) is caused by a disturbance of normal bowel function. Other terms used are spastic colon, mucous colitis, and irritable colon. It does not require surgery, nor does it lead to cancer. There is no cure for IBS. But with proper diet, stress reduction, and medication, you will find that your problems (symptoms) will gradually disappear or improve. IBS is a common digestive disorder. It usually appears in late adolescence or early adulthood. Women develop it twice as often as men.  CAUSES After food has been digested and absorbed in the small intestine, waste material is moved into the colon (large intestine). In the colon, water and salts are absorbed from the undigested products coming from the small intestine. The remaining residue, or fecal material, is held for elimination. Under normal circumstances, gentle, rhythmic contractions on the bowel walls push the fecal material along the colon towards the rectum. In IBS, however, these contractions are irregular and poorly coordinated. The fecal material is either retained too long, resulting in constipation, or expelled too soon, producing diarrhea.  SYMPTOMS  The most common symptom of IBS is pain. It is typically in the lower left side of the belly (abdomen). But it may occur anywhere in the abdomen. It can be felt as heartburn, backache, or even as a dull pain in the arms or shoulders. The pain comes from excessive bowel-muscle spasms and from the buildup of gas and fecal material in  the colon. This pain:  Can range from sharp belly (abdominal) cramps to a dull, continuous ache.   Usually worsens soon after eating.   Is typically relieved by having a bowel movement or passing gas.    Abdominal pain is usually accompanied by constipation. But it may also produce diarrhea. The diarrhea typically occurs right after a meal or upon arising in the morning. The stools are typically soft and watery. They are often flecked with secretions (mucus).  Other symptoms of IBS include:  Bloating.  Loss of appetite.   Heartburn.  Feeling sick to your stomach  (nausea).   Belching  Vomiting   Gas.  IBS may also cause a number of symptoms that are unrelated to the digestive system:  Fatigue.  Headaches.   Anxiety  Shortness of breath   Difficulty in concentrating.  Dizziness.   These symptoms tend to come and go.  TREATMENT A number of medications are available to help correct bowel function and/or relieve bowel spasms and abdominal pain. Among the drugs available are:  Mild, non-irritating laxatives for severe constipation and to help restore normal bowel habits.   Specific anti-diarrheal medications to treat severe or prolonged diarrhea.   Anti-spasmodic agents to relieve intestinal cramps.   HOME CARE INSTRUCTIONS   Avoid foods that are high in fat or oils. Some examples ZOX:WRUEA cream, butter, frankfurters, sausage, and other fatty meats.   Avoid foods that have a laxative effect, such as fruit, fruit juice, and dairy products.   Cut out carbonated drinks, chewing gum, and "gassy" foods, such as beans and cabbage. This may help relieve bloating and belching.  Keep track of what foods seem to trigger your symptoms.   Avoid emotionally charged situations or circumstances that produce anxiety.   Start or continue exercising.   Get plenty of rest and sleep.  Full Liquid Diet A high-calorie, high-protein supplement should be used to meet your  nutritional requirements when the full liquid diet is continued for more than 2 or 3 days. If this diet is to be used for an extended period of time (more than 7 days), a multivitamin should be considered.  Breads and Starches  Allowed: None are allowed except crackersWHOLE OR pureed (made into a thick, smooth soup) in soup. Cooked, refined corn, oat, rice, rye, and wheat cereals are also allowed.   Avoid: Any others.    Potatoes/Pasta/Rice  Allowed: ANY ITEM AS A SOUP OR SMALL PLATE OF MASHED POTATOES OR RICE.       Vegetables  Allowed: Strained tomato or vegetable juice. Vegetables pureed in soup.   Avoid: Any others.    Fruit  Allowed: Any strained fruit juices and fruit drinks. Include 1 serving of citrus or vitamin C-enriched fruit juice daily.   Avoid: Any others.  Meat and Meat Substitutes  Allowed: Egg  Avoid: Any meat, fish, or fowl. All cheese.  Milk  Allowed: SOY Milk beverages, including milk shakes and instant breakfast mixes. Smooth yogurt.   Avoid: Any others. Avoid dairy products if not tolerated.    Soups and Combination Foods  Allowed: Broth, strained cream soups. Strained, broth-based soups.   Avoid: Any others.    Desserts and Sweets  Allowed: flavored gelatin, tapioca, plain LACTOSE FREE ice cream, sherbet, smooth pudding, junket, fruit ices, frozen ice pops, pudding pops,, frozen fudge pops, chocolate syrup. Sugar, honey, jelly, syrup.   Avoid: Any others.  Fats and Oils  Allowed: Margarine, butter, cream, sour cream, oils.   Avoid: Any others.  Beverages  Allowed: All.   Avoid: None.  Condiments  Allowed: Iodized salt, pepper, spices, flavorings. Cocoa powder.   Avoid: Any others.    SAMPLE MEAL PLAN Breakfast   cup orange juice.   1 cup cooked wheat cereal.   1 cup SOY milk.   1 cup beverage (coffee or tea).   Cream or sugar, if desired.    Midmorning Snack  2 SCRAMBLED OR HARD BOILED EGG   Lunch  1 cup  cream soup.    cup fruit juice.   1 cup SOY milk.    cup custard.   1 cup beverage (coffee or tea).   Cream or sugar, if desired.    Midafternoon Snack  1 cup VANILLA SOY milk shake.  Dinner  1 cup cream soup.    cup fruit juice.   1 cup SOY MILK milk.    cup pudding.   1 cup beverage (coffee or tea).   Cream or sugar, if desired.   To increase calories, add sugar, or margarine if possible. Nutritional supplements will also increase the total calories.

## 2011-09-04 NOTE — Progress Notes (Signed)
Subjective:    Patient ID: [name], female    DOB: 1975-11-29, 36 y.o.   MRN: [id number]  PCP: Saralyn Pilar  HPI Stomach hurting nonstop since 2 weeks. Has had this in the past: ? Etiology. ? Related to her bone disorder. Hurting in her lower abdomen: dull pain. Has a sharp pain/burnig pain above. No fever or chills. Had a virus: 2 weeks-Sx lasted a day: diarrhea, but no vomiting. Bms: every other day, solid, formed. No blood in stool. BCs 1x/wk (one). Pain better with laying in something hard. FOOD DOESN'T MAKE IT WORSE. PASSING GAS MAY MAKE IT A LITTLE BETTER. No change w/ BMs. USING IBUPROFEN FROM TIME TO TIME(4/D) if can't have her BCs. Out of Imitrex. Swallowing is better. At time sit feels tight. No BRBPR OR MELENA.   Past Medical History  Diagnosis Date  . Anxiety   . Osteoarthritis of hip   . History of migraine headaches     Typical symptoms - bright lights, unilateral (starting behind her left ear), throbbing .  Marland Kitchen Surgical menopause 08/2007    On HRT. Occuring since 01/09 following TAH and R-salpingo-oophorectomy   . Tibial fracture 10/2008    Left - Sustained 2/2 fall down stairs. Patient is S/P closed internal fixation with Smith Nephew tibial nail locked proximal and distal  . Endometriosis     S/P TAH and salpingo-oophorectomy, right  . Chronic cough   . Gastritis     Thought due to chronic NSAID use 2/2 pain from her congenital hip abnormality  . Epigastric pain     Chronic, thought 2/2 gastritis, EGD (07/14/2011) showing chronic duodenitis, H.pyloir neg  . 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 With chronic OA of hip  . Vitamin D deficiency 11/2009    Vit D level 15 in 11/2009  . Closed fibular fracture 10/2008    Left - Sustained 2/2 fall down stairs (same fall as tibular fracture). S/P internal fixation.  . Hidradenitis suppurativa 05/2006  . Protrusio acetabuli     diagnosed at age  1  . Ovarian cancer 1995  . Headache     Migraines  . Duodenitis     EGD (07/14/2011) showing chronic duodenitis, H.pyloir neg  . Esophageal ring     s/p dilatation during EGD (07/2011) - Dr. Darrick Penna    Past Surgical History  Procedure Date  . Cesarean section 08/2005    Primary low transverse  . Tubal ligation 08/2005    Right tubal ligation  . Laparoscopy 10/2000    Operative laparoscopy with lysis of right adnexal adhesions and uterointestinal adhesions  . Other surgical history 02/2009    Closed treatment internal fixation, left tibia with Katrinka Blazing and Nephew tibial nail locked proximal and distal  . Total abdominal hysterectomy w/ bilateral salpingoophorectomy 08/2007    for endometriosis. With concurrent right salpingo-oophorectomy  (2009), left oophorectomy (1995)  . Left tib fib   . Joint replacement     right hip  . Fracture surgery     Right hip    Allergies  Allergen Reactions  . Morphine And Related Itching  . Pineapple     Has Epi pen    Current Outpatient Prescriptions  Medication Sig Dispense Refill  . EPINEPHrine (EPIPEN 2-PAK) 0.3 mg/0.3 mL DEVI Use if you experience throat tightness or shortness of breath after exposure to allergen.       Marland Kitchen ibuprofen (ADVIL,MOTRIN) 200 MG tablet Take  800 mg by mouth every 6 (six) hours as needed.        . Misc Natural Products (FIBER 7 PO) Take by mouth.      .      .       Family History  Problem Relation Age of Onset  . Diabetes type II    . Arthritis    . Breast cancer Sister 34    remission  . Colon cancer Neg Hx   . Liver disease Neg Hx    History   Social History Narrative   Insurance: Medicare.Patient is married with two children, [place] and [place].      Review of Systems     Objective:   Physical Exam  Vitals reviewed. Constitutional: She is oriented to person, place, and time. She appears well-developed. No distress.  HENT:  Head: Normocephalic and atraumatic.  Mouth/Throat: Oropharynx is  clear and moist. No oropharyngeal exudate.  Eyes: Pupils are equal, round, and reactive to light. No scleral icterus.  Neck: Normal range of motion. Neck supple.  Cardiovascular: Normal rate, regular rhythm and normal heart sounds.   Pulmonary/Chest: Effort normal and breath sounds normal. No respiratory distress.  Abdominal: Soft. Bowel sounds are normal. She exhibits no distension. There is tenderness (MILD TTP x4). There is no rebound and no guarding.  Musculoskeletal: Normal range of motion. She exhibits no edema.  Lymphadenopathy:    She has no cervical adenopathy.  Neurological: She is alert and oriented to person, place, and time.       NO FOCAL DEFICITS   Psychiatric:       ANXIOUS MOOD          Assessment & Plan:

## 2011-09-04 NOTE — Assessment & Plan Note (Addendum)
PRECEDED BY A VIRAL ILLNESS. NO S/Sx OF OBSTRUCTION. LAST CT FOR LOWER ABD PAIN FEB 2012: NAIAP. PMHx; LYSIS OF ADHESIONS. TAKING NSAIDS.  DIFFERENTIAL DIAGNOSIS INCLUDES POST-INFECTIOUS IBS, NSAID INDUCED DISEASE, ADHESIONS.  MINIMIZE YOU USE OF IBUPROFEN AND BC POWDERS. SEE YOU PRIMARY DOC FOR IMITREX REFILLS. STOP TAKING FIBER SUPPLEMENTS UNTIL YOUR PAIN SUBSIDES. UPPER ENDOSCOPY WITH DILATION AND COLONOSCOPY ON FEB 5. ADD A PROBIOTIC DAILY (ALIGN OR [facility] BRAND). FOLLOW A FULL LIQUID DIET FOR THE NEXT 3 DAYS. SEE INFO BELOW. FOLLOW UP IN 4 MOS.

## 2011-09-04 NOTE — Assessment & Plan Note (Signed)
UNCLEAR ETIOLOGY & NOW WITH ABDOMINAL PAIN MOST LIKELY DUE TO NSAID INDUCED ENTEROPATHY/COLOPATHY, less likely IBD.  TCS FEB 5. CHECK FERRITIN/CBC ON DAY OF PROCEDURE. OPV IN 3 MOS.

## 2011-09-04 NOTE — Assessment & Plan Note (Addendum)
MOST LIKELY DUE GASTRITIS/DUODENITIS IN PT TAKING NSAIDS W/O PPI, LESS LIKELY IBS.  Start PRILOSEC EVERY MORNING. PRESCRIPTION WAS SENT TO [facility]. OPV IN 4 MOS.

## 2011-09-04 NOTE — Assessment & Plan Note (Signed)
SX IMPROVED BUT NOT RESOLVED.  EGD/DIL FEB 5. OPV IN 4 MOS.

## 2011-09-08 ENCOUNTER — Encounter (HOSPITAL_COMMUNITY): Payer: Self-pay | Admitting: Pharmacy Technician

## 2011-09-09 NOTE — Progress Notes (Signed)
Reminder in epic to follow up in March 2013 °

## 2011-09-11 ENCOUNTER — Other Ambulatory Visit: Payer: Self-pay | Admitting: Gastroenterology

## 2011-09-11 MED ORDER — PEG-KCL-NACL-NASULF-NA ASC-C 100 G PO SOLR: 1.0000 | Freq: Once | ORAL | Status: DC

## 2011-09-19 MED ORDER — SODIUM CHLORIDE 0.45 % IV SOLN
Freq: Once | INTRAVENOUS | Status: AC
  Administered 2011-09-22: 09:00:00 via INTRAVENOUS

## 2011-09-22 ENCOUNTER — Other Ambulatory Visit: Payer: Self-pay | Admitting: Gastroenterology

## 2011-09-22 ENCOUNTER — Ambulatory Visit (HOSPITAL_COMMUNITY)
Admission: RE | Admit: 2011-09-22 | Discharge: 2011-09-22 | Disposition: A | Discharge status: Home / Self Care | Payer: Managed Care, Other (non HMO) | Source: Ambulatory Visit | Attending: Gastroenterology | Admitting: Gastroenterology

## 2011-09-22 ENCOUNTER — Encounter (HOSPITAL_COMMUNITY)
Admission: RE | Disposition: A | Discharge status: Home / Self Care | Payer: Self-pay | Source: Ambulatory Visit | Attending: Gastroenterology

## 2011-09-22 ENCOUNTER — Encounter (HOSPITAL_COMMUNITY): Payer: Self-pay | Admitting: *Deleted

## 2011-09-22 DIAGNOSIS — D128 Benign neoplasm of rectum: Secondary | ICD-10-CM | POA: Diagnosis not present

## 2011-09-22 DIAGNOSIS — R1310 Dysphagia, unspecified: Secondary | ICD-10-CM | POA: Diagnosis not present

## 2011-09-22 DIAGNOSIS — D649 Anemia, unspecified: Secondary | ICD-10-CM | POA: Diagnosis not present

## 2011-09-22 DIAGNOSIS — R1319 Other dysphagia: Secondary | ICD-10-CM

## 2011-09-22 DIAGNOSIS — K620 Anal polyp: Secondary | ICD-10-CM | POA: Diagnosis not present

## 2011-09-22 DIAGNOSIS — K2940 Chronic atrophic gastritis without bleeding: Secondary | ICD-10-CM | POA: Diagnosis not present

## 2011-09-22 DIAGNOSIS — Z01812 Encounter for preprocedural laboratory examination: Secondary | ICD-10-CM | POA: Insufficient documentation

## 2011-09-22 DIAGNOSIS — R109 Unspecified abdominal pain: Secondary | ICD-10-CM | POA: Diagnosis not present

## 2011-09-22 DIAGNOSIS — K2970 Gastritis, unspecified, without bleeding: Secondary | ICD-10-CM | POA: Diagnosis not present

## 2011-09-22 DIAGNOSIS — K222 Esophageal obstruction: Secondary | ICD-10-CM | POA: Insufficient documentation

## 2011-09-22 DIAGNOSIS — K648 Other hemorrhoids: Secondary | ICD-10-CM | POA: Diagnosis not present

## 2011-09-22 DIAGNOSIS — K449 Diaphragmatic hernia without obstruction or gangrene: Secondary | ICD-10-CM | POA: Insufficient documentation

## 2011-09-22 DIAGNOSIS — D509 Iron deficiency anemia, unspecified: Secondary | ICD-10-CM | POA: Insufficient documentation

## 2011-09-22 DIAGNOSIS — D131 Benign neoplasm of stomach: Secondary | ICD-10-CM | POA: Diagnosis not present

## 2011-09-22 DIAGNOSIS — K2990 Gastroduodenitis, unspecified, without bleeding: Secondary | ICD-10-CM | POA: Diagnosis not present

## 2011-09-22 DIAGNOSIS — K621 Rectal polyp: Secondary | ICD-10-CM | POA: Diagnosis not present

## 2011-09-22 HISTORY — PX: ESOPHAGOGASTRODUODENOSCOPY: SHX1529

## 2011-09-22 HISTORY — PX: COLONOSCOPY: SHX5424

## 2011-09-22 LAB — CBC
Platelets: 234 10*3/uL (ref 150–400)
RBC: 4.27 MIL/uL (ref 3.87–5.11)
RDW: 14.3 % (ref 11.5–15.5)
WBC: 7.8 10*3/uL (ref 4.0–10.5)

## 2011-09-22 SURGERY — COLONOSCOPY
Anesthesia: Moderate Sedation

## 2011-09-22 MED ORDER — MIDAZOLAM HCL 5 MG/5ML IJ SOLN
INTRAMUSCULAR | Status: DC | PRN
  Administered 2011-09-22 (×2): 2 mg via INTRAVENOUS
  Administered 2011-09-22: 1 mg via INTRAVENOUS

## 2011-09-22 MED ORDER — MIDAZOLAM HCL 5 MG/5ML IJ SOLN
INTRAMUSCULAR | Status: AC
  Filled 2011-09-22: qty 10

## 2011-09-22 MED ORDER — BUTAMBEN-TETRACAINE-BENZOCAINE 2-2-14 % EX AERO
INHALATION_SPRAY | CUTANEOUS | Status: DC | PRN
  Administered 2011-09-22: 2 via TOPICAL

## 2011-09-22 MED ORDER — STERILE WATER FOR IRRIGATION IR SOLN
Status: DC | PRN
  Administered 2011-09-22: 09:00:00

## 2011-09-22 MED ORDER — MEPERIDINE HCL 100 MG/ML IJ SOLN
INTRAMUSCULAR | Status: DC | PRN
  Administered 2011-09-22 (×2): 50 mg via INTRAVENOUS

## 2011-09-22 MED ORDER — MINERAL OIL PO OIL
TOPICAL_OIL | ORAL | Status: AC
  Filled 2011-09-22: qty 30

## 2011-09-22 MED ORDER — MEPERIDINE HCL 100 MG/ML IJ SOLN
INTRAMUSCULAR | Status: AC
  Filled 2011-09-22: qty 2

## 2011-09-22 NOTE — H&P (View-Only) (Signed)
Subjective:    Patient ID: [name], female    DOB: 12/02/1975, 36 y.o.   MRN: [id number]  PCP: KALIA-REYNOLDS  HPI Stomach hurting nonstop since 2 weeks. Has had this in the past: ? Etiology. ? Related to her bone disorder. Hurting in her lower abdomen: dull pain. Has a sharp pain/burnig pain above. No fever or chills. Had a virus: 2 weeks-Sx lasted a day: diarrhea, but no vomiting. Bms: every other day, solid, formed. No blood in stool. BCs 1x/wk (one). Pain better with laying in something hard. FOOD DOESN'T MAKE IT WORSE. PASSING GAS MAY MAKE IT A LITTLE BETTER. No change w/ BMs. USING IBUPROFEN FROM TIME TO TIME(4/D) if can't have her BCs. Out of Imitrex. Swallowing is better. At time sit feels tight. No BRBPR OR MELENA.   Past Medical History  Diagnosis Date  . Anxiety   . Osteoarthritis of hip   . History of migraine headaches     Typical symptoms - bright lights, unilateral (starting behind her left ear), throbbing .  . Surgical menopause 08/2007    On HRT. Occuring since 01/09 following TAH and R-salpingo-oophorectomy   . Tibial fracture 10/2008    Left - Sustained 2/2 fall down stairs. Patient is S/P closed internal fixation with Smith Nephew tibial nail locked proximal and distal  . Endometriosis     S/P TAH and salpingo-oophorectomy, right  . Chronic cough   . Gastritis     Thought due to chronic NSAID use 2/2 pain from her congenital hip abnormality  . Epigastric pain     Chronic, thought 2/2 gastritis, EGD (07/14/2011) showing chronic duodenitis, H.pyloir neg  . 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 With chronic OA of hip  . Vitamin D deficiency 11/2009    Vit D level 15 in 11/2009  . Closed fibular fracture 10/2008    Left - Sustained 2/2 fall down stairs (same fall as tibular fracture). S/P internal fixation.  . Hidradenitis suppurativa 05/2006  . Protrusio acetabuli     diagnosed at age  14  . Ovarian cancer 1995  . Headache     Migraines  . Duodenitis     EGD (07/14/2011) showing chronic duodenitis, H.pyloir neg  . Esophageal ring     s/p dilatation during EGD (07/2011) - Dr. Fields    Past Surgical History  Procedure Date  . Cesarean section 08/2005    Primary low transverse  . Tubal ligation 08/2005    Right tubal ligation  . Laparoscopy 10/2000    Operative laparoscopy with lysis of right adnexal adhesions and uterointestinal adhesions  . Other surgical history 02/2009    Closed treatment internal fixation, left tibia with Smith and Nephew tibial nail locked proximal and distal  . Total abdominal hysterectomy w/ bilateral salpingoophorectomy 08/2007    for endometriosis. With concurrent right salpingo-oophorectomy  (2009), left oophorectomy (1995)  . Left tib fib   . Joint replacement     right hip  . Fracture surgery     Right hip    Allergies  Allergen Reactions  . Morphine And Related Itching  . Pineapple     Has Epi pen    Current Outpatient Prescriptions  Medication Sig Dispense Refill  . EPINEPHrine (EPIPEN 2-PAK) 0.3 mg/0.3 mL DEVI Use if you experience throat tightness or shortness of breath after exposure to allergen.       . ibuprofen (ADVIL,MOTRIN) 200 MG tablet Take   800 mg by mouth every 6 (six) hours as needed.        . Misc Natural Products (FIBER 7 PO) Take by mouth.      .      .       Family History  Problem Relation Age of Onset  . Diabetes type II    . Arthritis    . Breast cancer Sister 39    remission  . Colon cancer Neg Hx   . Liver disease Neg Hx    History   Social History Narrative   Insurance: Medicare.Patient is married with two children, [place] and [place].      Review of Systems     Objective:   Physical Exam  Vitals reviewed. Constitutional: She is oriented to person, place, and time. She appears well-developed. No distress.  HENT:  Head: Normocephalic and atraumatic.  Mouth/Throat: Oropharynx is  clear and moist. No oropharyngeal exudate.  Eyes: Pupils are equal, round, and reactive to light. No scleral icterus.  Neck: Normal range of motion. Neck supple.  Cardiovascular: Normal rate, regular rhythm and normal heart sounds.   Pulmonary/Chest: Effort normal and breath sounds normal. No respiratory distress.  Abdominal: Soft. Bowel sounds are normal. She exhibits no distension. There is tenderness (MILD TTP x4). There is no rebound and no guarding.  Musculoskeletal: Normal range of motion. She exhibits no edema.  Lymphadenopathy:    She has no cervical adenopathy.  Neurological: She is alert and oriented to person, place, and time.       NO FOCAL DEFICITS   Psychiatric:       ANXIOUS MOOD          Assessment & Plan:   

## 2011-09-22 NOTE — Interval H&P Note (Signed)
History and Physical Interval Note:  09/22/2011 9:20 AM  [name]  has presented today for surgery, with the diagnosis of dysphagia and anemia  The various methods of treatment have been discussed with the patient and family. After consideration of risks, benefits and other options for treatment, the patient has consented to  Procedure(s): COLONOSCOPY ESOPHAGOGASTRODUODENOSCOPY (EGD) WITH ESOPHAGEAL DILATION as a surgical intervention .  The patients' history has been reviewed, patient examined, no change in status, stable for surgery.  I have reviewed the patients' chart and labs.  Questions were answered to the patient's satisfaction.     [employer]

## 2011-09-23 NOTE — Op Note (Signed)
[facility] [address] [place], [place]  [address]  ENDOSCOPY PROCEDURE REPORT  PATIENT:  Diana, [name]  MR#:  [id number] BIRTHDATE:  Aug 20, 1975, 35 yrs. old  GENDER:  female  ENDOSCOPIST:  Jonette Eva, MD ASSISTANT: Referred by:  Johnette Abraham, M.D.  PROCEDURE DATE:  09/22/2011 PROCEDURE:  EGD with biopsy, EGD with dilatation over guidewire ASA CLASS: INDICATIONS:  DYSPHAGIA  MEDICATIONS:   TCS+Versed 1 mg IV TOPICAL ANESTHETIC:  Cetacaine Spray  DESCRIPTION OF PROCEDURE:   After the risks benefits and alternatives of the procedure were thoroughly explained, informed consent was obtained.  The EC-3890Li ([id number]) and EG-[id number] ([id number]) endoscope was introduced through the mouth and advanced to the second portion of the duodenum.  The instrument was slowly withdrawn as the mucosa was carefully examined.  Prior to withdrawal of the scope, the guidwire was placed.  The esophagus was dilated successfully.  The patient was recovered in endoscopy and discharged home in satisfactory condition. <<PROCEDUREIMAGES>>  An DISTAL esophageal ring was found.  A 8 MM sessile polyp was found in the body of the stomach & BIOPSIED VIA COLD FORCEPS.  A SMALL hiatal hernia was found.    Dilation was then performed at the total esophagus  1) Dilator:  Savary over guidewire  Size(s):  15 TO 18 MM Resistance:  minimal  Heme:  none Appearance:  COMPLICATIONS:  None  ENDOSCOPIC IMPRESSION: 1) Ring, esophageal 2) Sessile polyp in the body of the stomach 3) Hiatal hernia 4) ABDOMINAL PAIN LIKELY FUNCTIONAL V. POST-INFECTIOUS IBS 5) NORMOCYTIC ANEMIA, NL FERRITIN-ACD v. EARLY FEDA  RECOMMENDATIONS: OMP QAM AWAIT BIOPSIES ADD PROBIOTIC DAILY NO NSAIDS FOR 2 WEEKS. TYLENOL PRN.  LABS FAB 2013: HB 11.7 MCV 82 PLT 234  FERRITIN 43  REPEAT EXAM:  No  ______________________________ Jonette Eva, MD  CC:  n. eSIGNED:   Jeaninne Lodico at 09/23/2011 04:00  PM  Madaline Guthrie, [phone]

## 2011-09-23 NOTE — Op Note (Signed)
[facility] [address] [place], [place]  [address]  COLONOSCOPY PROCEDURE REPORT  PATIENT:  [name], [name]  MR#:  [id number] BIRTHDATE:  05-23-1976, 35 yrs. old  GENDER:  female  ENDOSCOPIST:  Jonette Eva, MD REF. BY:  Johnette Abraham, M.D. ASSISTANT:  PROCEDURE DATE:  09/22/2011 PROCEDURE:  Colonoscopy with biopsy  INDICATIONS:  ANEMIA  MEDICATIONS:   Demerol 100 mg IV, Versed 6 mg IV  DESCRIPTION OF PROCEDURE:    Physical exam was performed. Informed consent was obtained from the patient after explaining the benefits, risks, and alternatives to procedure.  The patient was connected to monitor and placed in left lateral position. Continuous oxygen was provided by nasal cannula and IV medicine administered through an indwelling cannula.  After administration of sedation and rectal exam, the patient's rectum was intubated and the EC-3890Li ([id number]) colonoscope was advanced under direct visualization to the cecum.  The scope was removed slowly by carefully examining the color, texture, anatomy, and integrity mucosa on the way out.  The patient was recovered in endoscopy and discharged home in satisfactory condition. <<PROCEDUREIMAGES>>  FINDINGS:  A3 MM sessile polyp was found in the rectum  REMOVEDVIA COLD FORCEPS.  SMALL nternal Hemorrhoids were found. UNABLE TO INTUBATE CECU, DUE TO TORTUOUS COLON.  PREP QUALITY: EXCELLENT CECAL W/D TIME:    12 minutes  COMPLICATIONS:    None  ENDOSCOPIC IMPRESSION: 1) Sessile polyp in the rectum 2) Internal hemorrhoids  RECOMMENDATIONS: TCS IN 10 YEARS W/ PROPOFOL/PEDS SCOPE/OVERTUBE 1 HOUR TIME SLOT  HIGH FIBER DIET AWAIT BIOPSIES IF NO SOURCE FOR MILD ANEMIA FOUND, CE TO COMPLETE EVALUATION  REPEAT EXAM:  No  ______________________________ Jonette Eva, MD  CC:  Johnette Abraham, M.D.  n. eSIGNED:   Autym Siess at 09/23/2011 04:05 PM  Madaline Guthrie, [phone]

## 2011-09-25 ENCOUNTER — Telehealth: Payer: Self-pay | Admitting: Gastroenterology

## 2011-09-25 ENCOUNTER — Telehealth: Payer: Self-pay

## 2011-09-25 NOTE — Telephone Encounter (Signed)
Pt returned phone call for results and was given them. She said she also has a constant dull pain in her abdomen since the procedure. She said she feels full all of the time and she is not eating that much. Please advise! ( she is aware that Dr. Darrick Penna has left for the day and it will be tomorrow before she hears back from the office).

## 2011-09-25 NOTE — Telephone Encounter (Signed)
LMOM to call.

## 2011-09-25 NOTE — Telephone Encounter (Signed)
Informed pt of results.

## 2011-09-25 NOTE — Telephone Encounter (Signed)
Please call pt. Her Hb was 11.6 which is near normal and her iron stores are normal. Her borderline low blood count is most likely due to GI blood loss from chronic use of Ibuprofen which causes gastritis and duodenitis. She had a BENIGN polyp removed FROM HER COLON AND STOMACH. TCS in 10 years. High fiber diet. OPV IN LATE MAY OR EARLY June. WE WILL RECHECK HER BLOOD COUNT AND IRON STORES AT THAT TIME.

## 2011-09-25 NOTE — Telephone Encounter (Signed)
Results Cc to PCP  

## 2011-09-26 NOTE — Telephone Encounter (Signed)
Called, many rings and no answer.

## 2011-09-26 NOTE — Telephone Encounter (Signed)
Please call pt. She has IBS. Her Sx are due to IBS. Eat as tolerated. If she is feeling full she should follow a soft mechanical diet/dairy free diet for 3 days.

## 2011-09-29 ENCOUNTER — Encounter (HOSPITAL_COMMUNITY): Payer: Self-pay | Admitting: Gastroenterology

## 2011-09-29 NOTE — Telephone Encounter (Signed)
LMOM to call.

## 2011-09-29 NOTE — Telephone Encounter (Signed)
Reminder in epic for tcs in 10 years °

## 2011-09-30 NOTE — Telephone Encounter (Signed)
Called and informed pt.  

## 2011-10-19 ENCOUNTER — Encounter: Payer: Self-pay | Admitting: Internal Medicine

## 2011-10-29 ENCOUNTER — Encounter: Payer: Managed Care, Other (non HMO) | Admitting: Internal Medicine

## 2011-10-29 ENCOUNTER — Other Ambulatory Visit: Payer: Self-pay | Admitting: *Deleted

## 2011-10-29 ENCOUNTER — Encounter: Payer: Self-pay | Admitting: Internal Medicine

## 2011-10-29 ENCOUNTER — Ambulatory Visit (INDEPENDENT_AMBULATORY_CARE_PROVIDER_SITE_OTHER): Payer: Managed Care, Other (non HMO) | Admitting: Internal Medicine

## 2011-10-29 VITALS — BP 142/90 | HR 54 | Temp 97.3°F | Ht 70.5 in | Wt 177.5 lb

## 2011-10-29 DIAGNOSIS — G43909 Migraine, unspecified, not intractable, without status migrainosus: Secondary | ICD-10-CM

## 2011-10-29 DIAGNOSIS — I10 Essential (primary) hypertension: Secondary | ICD-10-CM | POA: Diagnosis not present

## 2011-10-29 DIAGNOSIS — K0889 Other specified disorders of teeth and supporting structures: Secondary | ICD-10-CM

## 2011-10-29 DIAGNOSIS — K089 Disorder of teeth and supporting structures, unspecified: Secondary | ICD-10-CM | POA: Diagnosis not present

## 2011-10-29 MED ORDER — BUTALBITAL-APAP-CAFFEINE 50-325-40 MG PO TABS: 1.0000 | ORAL_TABLET | Freq: Four times a day (QID) | ORAL | Status: DC | PRN

## 2011-10-29 NOTE — Progress Notes (Signed)
[name] is a 36 yo woman with PMH of anxiety, migraine headache, osteoarthritis 2/2 congenital hip abnormality, HTN, back pain, gastritis/duodenitis presents today for right cheek swelling and dental abscess since fridaty. The swelling has decreased in size after she started taking Ibuprofen (about 4 of 800mg  per day).  She does have chills but no fever or N/V.   Migraine - pain locates frontal area, seeing zigzag pattern, +photophobia, + phonophobia.  Imitrex works well but it is costly. She has been taking Alka Seltzer for her pain along with congestion.  ROS: As per HPI  PE: General: alert, well-developed, and cooperative to examination.  FACE: no facial swelling noted.  Cavities on teeth #4 on right upper wearing partial.  No erythema, drainage, or fluctuant.  Tenderness to palpation of the gum around teeth #4 Lungs: normal respiratory effort, no accessory muscle use, normal breath sounds, no crackles, and no wheezes. Heart: normal rate, regular rhythm, no murmur, no gallop, and no rub.  Abdomen: soft, non-tender, normal bowel sounds, no distention, no guarding, no rebound tenderness Extremities: No cyanosis, clubbing, edema Neurologic: alert & oriented X3, cranial nerves II-XII intact, strength normal in all extremities, sensation intact to light touch, and gait normal.

## 2011-10-29 NOTE — Assessment & Plan Note (Signed)
Cavities on teeth #4 upper right.  NO abscess or signs of infection present.  She will need dental evaluation -Will refer to dentist -Pain control with Fioricet for her migraine as well

## 2011-10-29 NOTE — Progress Notes (Signed)
A user error has taken place: encounter opened in error, closed for administrative reasons.Criss Alvine, Lucynda Rosano Cassady3/20/20139:35 AM  .

## 2011-10-29 NOTE — Assessment & Plan Note (Addendum)
Acute episode of migraine headache with photophobia and phonophobia.  She reports Imitrex works well for her in the past but costly.  She has been taking a lot of NSAIDs which is not good given her gastritis/duodenitis hx. -Will try Fioricet 50/325/40mg  1-2 tablet every 6 hours as needed for headache -May need to try opiates if Fioricet does not work -STOP NSAIDs

## 2011-10-29 NOTE — Patient Instructions (Addendum)
Take Fioricet 1-2 tablets every 6 hours as needed for migraine headache Will refer you to a dentist for your dental pain You should not be taking Ibuprofen or Aleve or any nonsteroidal anti-inflammatory medications because of your gastritis Follow up with your primary care physician as needed

## 2011-10-29 NOTE — Assessment & Plan Note (Signed)
Not well-controlled on arrival.  Repeat BP was 142/90. This is likely 2/2 to her migraine.  I reviewed her previous BPs and she is normally normotensive with a few spikes and she reports very elevated BPs whenever she has a migraine; therefore, will not start anti-hypertensive med today.  If persistent, may need to start meds if not controlled by diet. -Low salt diet & exercise

## 2011-10-30 ENCOUNTER — Encounter: Payer: Self-pay | Admitting: Gastroenterology

## 2011-12-19 ENCOUNTER — Ambulatory Visit (INDEPENDENT_AMBULATORY_CARE_PROVIDER_SITE_OTHER): Payer: Managed Care, Other (non HMO) | Admitting: Internal Medicine

## 2011-12-19 ENCOUNTER — Other Ambulatory Visit: Payer: Self-pay | Admitting: Internal Medicine

## 2011-12-19 ENCOUNTER — Encounter: Payer: Self-pay | Admitting: Internal Medicine

## 2011-12-19 VITALS — BP 141/91 | HR 61 | Temp 97.7°F | Ht 70.5 in | Wt 174.1 lb

## 2011-12-19 DIAGNOSIS — E559 Vitamin D deficiency, unspecified: Secondary | ICD-10-CM

## 2011-12-19 DIAGNOSIS — R5383 Other fatigue: Secondary | ICD-10-CM | POA: Insufficient documentation

## 2011-12-19 DIAGNOSIS — G478 Other sleep disorders: Secondary | ICD-10-CM | POA: Diagnosis not present

## 2011-12-19 DIAGNOSIS — G479 Sleep disorder, unspecified: Secondary | ICD-10-CM | POA: Insufficient documentation

## 2011-12-19 DIAGNOSIS — R5381 Other malaise: Secondary | ICD-10-CM

## 2011-12-19 LAB — CBC
Hemoglobin: 12.1 g/dL (ref 12.0–15.0)
MCH: 27 pg (ref 26.0–34.0)
MCHC: 32.5 g/dL (ref 30.0–36.0)
Platelets: 272 10*3/uL (ref 150–400)
RBC: 4.48 MIL/uL (ref 3.87–5.11)

## 2011-12-19 LAB — COMPREHENSIVE METABOLIC PANEL
ALT: 14 U/L (ref 0–35)
AST: 19 U/L (ref 0–37)
CO2: 27 mEq/L (ref 19–32)
Chloride: 104 mEq/L (ref 96–112)
Creat: 0.87 mg/dL (ref 0.50–1.10)
Sodium: 138 mEq/L (ref 135–145)
Total Bilirubin: 1 mg/dL (ref 0.3–1.2)
Total Protein: 7.4 g/dL (ref 6.0–8.3)

## 2011-12-19 LAB — TSH: TSH: 1.211 u[IU]/mL (ref 0.350–4.500)

## 2011-12-19 MED ORDER — PAROXETINE HCL ER 25 MG PO TB24: 25.0000 mg | ORAL_TABLET | ORAL | Status: DC

## 2011-12-19 NOTE — Patient Instructions (Signed)
Schedule a follow up appointment with Dr. Saralyn Pilar in 3-6 months, sooner if needed. Try to drink more water during the day and to not drink as much fluid after 7 PM at night. Try over-the-counter melatonin to help improve your sleep.  Take the melatonin 30 minutes to 3 hours before you go to bed.  Most over-the-counter formulations come in 3 mg and 5 mg tablets.  Most of the benefit shown with melatonin have been with doses of 9 mg before bed.  You may take up to three 3mg  tabs or two 5mg  tablets of melatonin if needed for sleep. I will call you if any of your lab work is abnormal. Keep taking your medications as directed. Call the clinic at [phone] with any concerns or questions.

## 2011-12-19 NOTE — Assessment & Plan Note (Addendum)
Patient's fatigue may be the result of a mild, transient viral illness given concomitant nausea.  She denies feeling depressed and does not describe any other associated symptoms.  Will check a TSH today to evaluate for thyroid dysfunction, we'll also check a CBC to evaluate for possible underlying anemia.  I doubt any electrolyte, renal, or hepatic pathology is contributing to her fatigue but will check a comprehensive metabolic panel today for thoroughness.

## 2011-12-19 NOTE — Assessment & Plan Note (Signed)
Patient has a history of vitamin D deficiency with a vitamin D level of 15 in 2011; repeat vitamin D has not been repeated. We'll check vitamin D level today and replete with 50,000 units weekly for 5 weeks if indicated.

## 2011-12-19 NOTE — Assessment & Plan Note (Signed)
Patient reports a long history of difficulty sleeping.  She is able to fall asleep but often unable to remain asleep if she awakens during the night. She goes to bed at the same time every night at approximately 11 PM to midnight.  She frequently wakes up at approximately 2 AM to urinate afterward she has difficulty returning to sleep.  Advised patient to try to increase fluid intake earlier in the day and 2 decrease/refrain from fluid intake after 7 PM at night.  Discussed use of melatonin and other medications for treatment of insomnia.  Patient states she is interested in melatonin.  Advised her to take melatonin at least 30 minutes to 3 hours before bedtime.  We'll followup on this at her next office visit.

## 2011-12-19 NOTE — Progress Notes (Signed)
Patient ID: [name], female   DOB: 11/22/1975, 36 y.o.   MRN: [id number]  Subjective:     HPI:  Patient is a 36 year old female who is here today for refill of her paxil.  Patient reports increased fatigue over the past two weeks.  She notes some nausea during this time that is slowly improving; denies vomiting, diarrhea, abdominal pain, fever, or chills.  She denies feeling depressed or down and notes her mood is good.  She has not noted any associated symptoms and has no other concerns or complaints today.   Review of Systems Constitutional: Negative for fever, chills, diaphoresis, activity change, appetite change, fatigue and unexpected weight change.  HENT: Negative for hearing loss, congestion and neck stiffness.   Eyes: Negative for photophobia, pain and visual disturbance.  Respiratory: Negative for cough, chest tightness, shortness of breath and wheezing.   Cardiovascular: Negative for chest pain and palpitations.  Gastrointestinal: Negative for abdominal pain, blood in stool and anal bleeding.  Genitourinary: Negative for dysuria, hematuria and difficulty urinating.  Musculoskeletal: Negative for joint swelling.  Neurological: Negative for dizziness, syncope, speech difficulty, weakness, numbness and headaches. ]     Objective:   Physical Exam VItal signs reviewed and stable. GEN: No apparent distress.  Alert and oriented x 3.  Pleasant, conversant, and cooperative to exam. HEENT: head is autraumatic and normocephalic.  Neck is supple without palpable masses or lymphadenopathy.  No JVD or carotid bruits.  Vision intact.  EOMI.  PERRLA.  Sclerae anicteric.  Conjunctivae without pallor or injection. Mucous membranes are moist.  Oropharynx is without erythema, exudates, or other abnormal lesions.  Dentition is good RESP:  Lungs are clear to ascultation bilaterally with good air movement.  No wheezes, ronchi, or rubs. CARDIOVASCULAR: regular rate, normal rhythm.  Clear S1,  S2, no murmurs, gallops, or rubs. ABDOMEN: soft, non-tender, non-distended.  Bowels sounds present in all quadrants and normoactive.  No palpable masses. EXT: warm and dry.  Peripheral pulses equal, intact, and +2 globally.  No clubbing or cyanosis.  No edema in bilateral lower extremities. SKIN: warm and dry with normal turgor.  No rashes or abnormal lesions observed.     Assessment/Plan:

## 2011-12-20 LAB — VITAMIN D 25 HYDROXY (VIT D DEFICIENCY, FRACTURES): Vit D, 25-Hydroxy: 26 ng/mL — ABNORMAL LOW (ref 30–89)

## 2011-12-31 DIAGNOSIS — M169 Osteoarthritis of hip, unspecified: Secondary | ICD-10-CM | POA: Diagnosis not present

## 2011-12-31 DIAGNOSIS — Z96649 Presence of unspecified artificial hip joint: Secondary | ICD-10-CM | POA: Diagnosis not present

## 2011-12-31 DIAGNOSIS — Z471 Aftercare following joint replacement surgery: Secondary | ICD-10-CM | POA: Diagnosis not present

## 2012-01-29 ENCOUNTER — Ambulatory Visit (INDEPENDENT_AMBULATORY_CARE_PROVIDER_SITE_OTHER): Payer: Managed Care, Other (non HMO) | Admitting: Internal Medicine

## 2012-01-29 VITALS — BP 152/101 | HR 65 | Temp 97.0°F | Ht 70.5 in | Wt 173.4 lb

## 2012-01-29 DIAGNOSIS — R5381 Other malaise: Secondary | ICD-10-CM

## 2012-01-29 DIAGNOSIS — R5383 Other fatigue: Secondary | ICD-10-CM

## 2012-01-29 LAB — IRON AND TIBC
%SAT: 20 % (ref 20–55)
Iron: 75 ug/dL (ref 42–145)
TIBC: 377 ug/dL (ref 250–470)

## 2012-01-29 NOTE — Progress Notes (Signed)
Subjective:     Patient ID: [name], female   DOB: 24-Jun-1976, 36 y.o.   MRN: [id number]  HPI Patient is a 36 year old woman with a history of migraine, vitamin D deficiency, difficulty sleeping, and recent fatigue who presents for followup.  Patient was seen at the beginning of May, roughly six-weeks ago, for increasing fatigue. Since then, the patient describes continued fatigue, achiness, feeling of lead in her legs. She also describes some night sweats. She feels like she just can't quite get going. Her mood is good. She does have some headaches. No cough, no rhinorrhea, no abdominal pain, normal appetite, no lymph node swelling.  Her only medicine is Paxil for hot flashes. She is status post total abdominal hysterectomy and has had both her ovaries removed as well.  CBC, CMP, and TSH at the visit in the beginning of May were all within normal limits.  Review of Systems As per HPI    Objective:   Physical Exam GEN: NAD.  Alert and oriented x 3.  Pleasant, conversant, and cooperative to exam. RESP:  CTAB, no w/r/r CARDIOVASCULAR: RRR, S1, S2, no m/r/g ABDOMEN: soft, NT/ND, NABS EXT: warm and dry. No edema in b/l LE     Assessment:         Plan:

## 2012-01-29 NOTE — Assessment & Plan Note (Signed)
Patient with persistent fatigue and malaise of undetermined etiology. Differential includes malignancy, medication effect, iron deficiency.  Malignancy: Patient has GU organs removed, would be young for breast cancer, has had colonoscopy. CBC was normal. Low suspicion for malignancy overall.  Medications: Patient currently taking Paxil, which can cause fatigue and up to 25% of patients. Alternatively, could be a mood disorder and she is actually undertreated. She currently takes Paxil for hot flashes, which does raise question of whether she should be on hormone supplementation. She has had her ovaries removed, but is unclear if she had tried hormones in the past. Given her sleep disturbance, hot flashes, and fatigue, perhaps a trial of hormones would be warranted. She is very hesitant out of concern about weight gain (given her congenital hip abnormalities, increased weight causes her more pain).  Iron deficiency: Patient has been anemic in the past, and has borderline low ferritin. No recent iron level. Iron deficiency regardless of anemia could cause feelings of fatigue and malaise.  Tic: Patient did note a tick bite a few weeks ago, but thinks it was after her fatigue it started. She denied any rash. She does not seem to have RMSF, as I suspect she would be sicker and does not have a classic consolation of symptoms. Lung disease and [place] is less likely, she did not notice any circular rash, but she does have some joint pains (though there are orthopedic explanations for these as per.  Plan: - Iron level today - Trial off of Paxil - RTC 4 weeks to reevaluate - Consider hormone supplementation

## 2012-02-11 DIAGNOSIS — M1610 Unilateral primary osteoarthritis, unspecified hip: Secondary | ICD-10-CM | POA: Diagnosis not present

## 2012-02-11 DIAGNOSIS — M25559 Pain in unspecified hip: Secondary | ICD-10-CM | POA: Diagnosis not present

## 2012-02-11 DIAGNOSIS — M169 Osteoarthritis of hip, unspecified: Secondary | ICD-10-CM | POA: Diagnosis not present

## 2012-02-11 DIAGNOSIS — Z01818 Encounter for other preprocedural examination: Secondary | ICD-10-CM | POA: Diagnosis not present

## 2012-02-11 DIAGNOSIS — Z96649 Presence of unspecified artificial hip joint: Secondary | ICD-10-CM | POA: Diagnosis not present

## 2012-02-20 ENCOUNTER — Ambulatory Visit (INDEPENDENT_AMBULATORY_CARE_PROVIDER_SITE_OTHER): Payer: Medicare Other | Admitting: Internal Medicine

## 2012-02-20 ENCOUNTER — Encounter: Payer: Self-pay | Admitting: Internal Medicine

## 2012-02-20 VITALS — BP 149/103 | HR 60 | Temp 97.6°F | Ht 70.5 in | Wt 170.5 lb

## 2012-02-20 DIAGNOSIS — R232 Flushing: Secondary | ICD-10-CM

## 2012-02-20 DIAGNOSIS — K2940 Chronic atrophic gastritis without bleeding: Secondary | ICD-10-CM

## 2012-02-20 DIAGNOSIS — E559 Vitamin D deficiency, unspecified: Secondary | ICD-10-CM

## 2012-02-20 DIAGNOSIS — I10 Essential (primary) hypertension: Secondary | ICD-10-CM | POA: Diagnosis not present

## 2012-02-20 DIAGNOSIS — R42 Dizziness and giddiness: Secondary | ICD-10-CM

## 2012-02-20 DIAGNOSIS — M549 Dorsalgia, unspecified: Secondary | ICD-10-CM

## 2012-02-20 DIAGNOSIS — R5383 Other fatigue: Secondary | ICD-10-CM

## 2012-02-20 DIAGNOSIS — R110 Nausea: Secondary | ICD-10-CM

## 2012-02-20 DIAGNOSIS — R5381 Other malaise: Secondary | ICD-10-CM

## 2012-02-20 DIAGNOSIS — M25559 Pain in unspecified hip: Secondary | ICD-10-CM

## 2012-02-20 DIAGNOSIS — K449 Diaphragmatic hernia without obstruction or gangrene: Secondary | ICD-10-CM | POA: Insufficient documentation

## 2012-02-20 DIAGNOSIS — N951 Menopausal and female climacteric states: Secondary | ICD-10-CM

## 2012-02-20 DIAGNOSIS — G43909 Migraine, unspecified, not intractable, without status migrainosus: Secondary | ICD-10-CM

## 2012-02-20 DIAGNOSIS — G8929 Other chronic pain: Secondary | ICD-10-CM | POA: Insufficient documentation

## 2012-02-20 DIAGNOSIS — D649 Anemia, unspecified: Secondary | ICD-10-CM

## 2012-02-20 DIAGNOSIS — K2950 Unspecified chronic gastritis without bleeding: Secondary | ICD-10-CM

## 2012-02-20 DIAGNOSIS — Z0000 Encounter for general adult medical examination without abnormal findings: Secondary | ICD-10-CM

## 2012-02-20 MED ORDER — VITAMIN D (ERGOCALCIFEROL) 1.25 MG (50000 UNIT) PO CAPS: 50000.0000 [IU] | ORAL_CAPSULE | ORAL | Status: DC

## 2012-02-20 MED ORDER — ONDANSETRON HCL 4 MG PO TABS: 4.0000 mg | ORAL_TABLET | Freq: Every day | ORAL | Status: DC | PRN

## 2012-02-20 MED ORDER — PROPRANOLOL HCL ER 80 MG PO CP24: 80.0000 mg | ORAL_CAPSULE | Freq: Every day | ORAL | Status: DC

## 2012-02-20 MED ORDER — SUMATRIPTAN SUCCINATE 50 MG PO TABS: 50.0000 mg | ORAL_TABLET | Freq: Once | ORAL | Status: DC | PRN

## 2012-02-20 MED ORDER — SUMATRIPTAN SUCCINATE 50 MG PO TABS: 50.0000 mg | ORAL_TABLET | ORAL | Status: DC | PRN

## 2012-02-20 NOTE — Assessment & Plan Note (Addendum)
S/p TAH BSO Previously stopped Paroxitine but will d/c b/c this medication was not helping flashes Disc HRT. Pt previously took Premarin but stopped b/c concerned side effects Will hold HRT due to trying to control h/a. If start HRT today may aggravate h/a If h/a controlled in the future consider HRT.

## 2012-02-20 NOTE — Assessment & Plan Note (Addendum)
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 Recheck level in 6 weeks at f/u

## 2012-02-20 NOTE — Assessment & Plan Note (Addendum)
Several elevated measurements today Could be related to pain assoc with h/a and chronic pain but consistently elevated in other office visits  Will Rx Propranolol (Inderal 80 mg qd)-pt has h/o chronic migraines and hopefully this will tx both

## 2012-02-20 NOTE — Assessment & Plan Note (Addendum)
Recent iron studies wnl except low ferritin, tsh wnl, cbc wnl, cmp wnl Fatigue was not better on Paroxitin, pt has improved sleep  Will see if h/a control helps fatigue if not think other etiology (i.e rheum) and consider w/u if not improving

## 2012-02-20 NOTE — Progress Notes (Signed)
Subjective:    Patient ID: [name], female    DOB: [birth year], 36 y.o.   MRN: [id number]  HPI Comments: 36 y.o female with sig PMH and PsuH presents for h/a, increased thrist, chronic back and hip pain, fatigue, and soreness to right and left feet.    H/A Pt has a h/o migraines. She gets h/a's about 3/week. She is Rx'ed Fiorcet but has been taking it less because it makes her feel "groggy". She is using mostly Advil instead for her h/a's.  She normally takes 4 advil with relief.  She has a frontal h/a today 9/10. This is not the worst h/a of her life.  Normally the distribution of her h/a's are all over her head. Today she has not tried any medication.  She has tried to figure out triggers such as food but cant identify triggers.  She previously took Imitrex for h/a with relief but could not afford due to cost.    Increased thirst Pt c/o this sx. She has been drinking a lot of water and thinks her increased thirst is related to having hot flashes and sweating. The water helps to keep her cool.  Pt has h/o hot flashes and was previously taking Paroxitine but has not been taking the medication per provider ~1 mo to see if c/o nausea, h/a, dizziness and sleepiness could be med related.  While she was taking the medication it was not helping her hot flashes.    Irritability Pt c/o irritability. Previously taking Paroxitine and noticed still irritable on this med.   Decreased Energy and fatigue Energy level was still low on Paroxitine. Pt states she is still fatigued.  Reports being bitten by a tick 12/2011 and her husband removed the tick with his hand. She never took antibiotics.    Chronic pain Pt c/o chronic back and hip pain.  She has a Congenital d/o of her bones which has caused degenerative changes in her back and hip.  She states her daughter (age 5) sits in the center of her spine which helps decrease pressure.  Pt also has chronic hip pain esp right hip s/p replacement and walks  with abnormal gait.  She states hip pain has been worsening, worse with sitting long periods and better with rest.  She doesn't like to take pain medication for it. She has an appt 7/19 with Dr. Elesa Massed in [place] about her hip pain.    Nausea Pt is able to keep food down. Taking Zofran and it helps nausea  Right and left foot pain Pt c/o right foot pain with flexion x 2 weeks. She thought pain may have been due to her shoes so she has been switching shoes w/o relief.  Pt c/o that the sole of her left foot is painful she has a callus on the sole by the 2nd left toe and trimmed it down with a peg egg.  She denies swelling/redness either foot.   SH:  2 kids [address], [place] [address]. Denies social stressors. Married and it is going okay.      Review of Systems  Constitutional: Positive for fever, diaphoresis, appetite change, fatigue and unexpected weight change.       Decreased appetite H/o hot flashes not helped with Paroxitine Now sweating throughout the day perfusely  ~ 5 lb wt loss  Increased thirst  HENT:       Denies sore throat or trouble swallowing  Eyes:       Blurry vision w/ h/a  Cardiovascular: Positive for palpitations. Negative for chest pain.  Gastrointestinal:       Denies ab pain +nausea relieved w/ Zofran, denies vomitting Denies constipation/diarrhea  Genitourinary: Negative for dysuria, hematuria, vaginal bleeding and vaginal discharge.       Denies rectal bleeding  Neurological: Positive for dizziness, light-headedness and headaches.       Denies fainting  Psychiatric/Behavioral:       Improved sleep ~6 hrs/night  +memory loss (short term) i.e she has to keep reminding herself of what to do next and so does her husband. Irritable mood       Objective:   Physical Exam  Nursing note and vitals reviewed. Constitutional: She is oriented to person, place, and time. She appears well-developed and well-nourished. She is cooperative.       Pt appears  disheveled  HENT:  Head: Normocephalic and atraumatic.  Mouth/Throat: Oropharynx is clear and moist and mucous membranes are normal. No oropharyngeal exudate.  Eyes: Conjunctivae are normal. Pupils are equal, round, and reactive to light. No scleral icterus.       perrl but photophobia noted  Cardiovascular: Normal rate, regular rhythm, S1 normal, S2 normal and normal heart sounds.  Exam reveals no gallop and no friction rub.   No murmur heard. Pulmonary/Chest: Effort normal and breath sounds normal. She has no wheezes.  Abdominal: Soft. Normal appearance and bowel sounds are normal. She exhibits no distension. There is tenderness in the left lower quadrant.       Last BM per pt last Weds  Musculoskeletal:       No edema lower ext b/l Thoracic back TTP Pt unable to bend over and touch her toes MTP joints right foot TTP (toes 1-5) Sole of left foot with callus peared down too far  Neurological: She is alert and oriented to person, place, and time. She has normal strength. No cranial nerve deficit or sensory deficit. Gait abnormal.       CN 2-12 intact V1-V3 sensory (face) intact 5/5 strength upper ext/hand grip Chronic abnl gait after hip surgery  Skin: Skin is warm, dry and intact. No rash noted.  Psychiatric:       Flat affect          Assessment & Plan:  Follow up 1 month (Disc h/a, observe BP, check vit d level, ask hot flashes)

## 2012-02-20 NOTE — Assessment & Plan Note (Addendum)
Assoc w/ h/a Rx Zofran, per pt its helping

## 2012-02-20 NOTE — Assessment & Plan Note (Addendum)
Pt was previously taking Sumitriptan w/ h/a relief but stopped d/t cost. On this med h/a freq was 1x/week instead of 3x/week currently. Pt previously took no more than 10 pills per month of sumitriptan  Found Rx drug program for Imitrex medication cost $35 for ~(54)50 mg pills-pt filled out app and will mail to drug program  Rx Sumitriptan (imitrex) 50 mg, Rx Propanolol (Inderal) 80 mg qd for prevention

## 2012-02-20 NOTE — Assessment & Plan Note (Signed)
Recent iron studies wnl will not supplement fe currently even though ferritin low

## 2012-02-20 NOTE — Assessment & Plan Note (Addendum)
Orthostatics negative  D/c Paroxitine as could have several side effects  Hopefully controlling h/a will help dizziness and lightheadedness

## 2012-02-22 NOTE — Assessment & Plan Note (Signed)
Pt due for mammogram-make referral at f/u for 05/2012  2/20213 colonoscopy with sessile polyp rectume and internal hemm. Neg for malignancy Neg cytology for pap 03/2010

## 2012-02-22 NOTE — Assessment & Plan Note (Signed)
Pt doesn't like to take pain meds appt with Dr. Elesa Massed in [facility] 7/19 to f/u hip pain

## 2012-02-22 NOTE — Assessment & Plan Note (Signed)
Pt should be on PPI Needs to be Rx PPI at f/u appt

## 2012-02-22 NOTE — Assessment & Plan Note (Signed)
Previously noted degenerative changes in back with limited ROM on exam Pt doesn't like to take pain meds

## 2012-02-24 NOTE — Progress Notes (Signed)
I saw, examined, and discussed the patient with Dr Shirlee Latch and agree with the note contained here. Pts main issues today were HA, increased BP, and premature / surgical menopausal sxs. Dr Shirlee Latch is starting a BB as both an anti-HTN and migraine prophylaxis. Once we get her migraines controlled, pt would like to start HRT as her menopause sxs are interfering with her life. Controlled migraines for pt means a HA that requires abortive tx about 4 times a month.

## 2012-02-27 DIAGNOSIS — Z96649 Presence of unspecified artificial hip joint: Secondary | ICD-10-CM | POA: Diagnosis not present

## 2012-02-27 DIAGNOSIS — H53149 Visual discomfort, unspecified: Secondary | ICD-10-CM | POA: Diagnosis not present

## 2012-02-27 DIAGNOSIS — I10 Essential (primary) hypertension: Secondary | ICD-10-CM | POA: Diagnosis not present

## 2012-02-27 DIAGNOSIS — M247 Protrusio acetabuli: Secondary | ICD-10-CM | POA: Diagnosis present

## 2012-02-27 DIAGNOSIS — R42 Dizziness and giddiness: Secondary | ICD-10-CM | POA: Diagnosis not present

## 2012-02-27 DIAGNOSIS — M4120 Other idiopathic scoliosis, site unspecified: Secondary | ICD-10-CM | POA: Diagnosis present

## 2012-02-27 DIAGNOSIS — M169 Osteoarthritis of hip, unspecified: Secondary | ICD-10-CM | POA: Diagnosis not present

## 2012-02-27 DIAGNOSIS — G8918 Other acute postprocedural pain: Secondary | ICD-10-CM | POA: Diagnosis not present

## 2012-02-27 DIAGNOSIS — D62 Acute posthemorrhagic anemia: Secondary | ICD-10-CM | POA: Diagnosis not present

## 2012-02-27 DIAGNOSIS — M1610 Unilateral primary osteoarthritis, unspecified hip: Secondary | ICD-10-CM | POA: Diagnosis not present

## 2012-02-27 DIAGNOSIS — R509 Fever, unspecified: Secondary | ICD-10-CM | POA: Diagnosis not present

## 2012-02-27 DIAGNOSIS — R110 Nausea: Secondary | ICD-10-CM | POA: Diagnosis not present

## 2012-02-27 DIAGNOSIS — R51 Headache: Secondary | ICD-10-CM | POA: Diagnosis not present

## 2012-02-27 DIAGNOSIS — F29 Unspecified psychosis not due to a substance or known physiological condition: Secondary | ICD-10-CM | POA: Diagnosis not present

## 2012-02-27 DIAGNOSIS — M419 Scoliosis, unspecified: Secondary | ICD-10-CM | POA: Insufficient documentation

## 2012-02-27 DIAGNOSIS — L509 Urticaria, unspecified: Secondary | ICD-10-CM | POA: Diagnosis not present

## 2012-02-27 DIAGNOSIS — G43909 Migraine, unspecified, not intractable, without status migrainosus: Secondary | ICD-10-CM | POA: Diagnosis present

## 2012-03-01 ENCOUNTER — Telehealth: Payer: Self-pay | Admitting: *Deleted

## 2012-03-01 ENCOUNTER — Ambulatory Visit (INDEPENDENT_AMBULATORY_CARE_PROVIDER_SITE_OTHER): Payer: Medicare Other | Admitting: Internal Medicine

## 2012-03-01 ENCOUNTER — Emergency Department (HOSPITAL_COMMUNITY)
Admission: EM | Admit: 2012-03-01 | Discharge: 2012-03-01 | Disposition: A | Discharge status: Home / Self Care | Payer: Managed Care, Other (non HMO) | Attending: Emergency Medicine | Admitting: Emergency Medicine

## 2012-03-01 ENCOUNTER — Encounter: Payer: Self-pay | Admitting: Internal Medicine

## 2012-03-01 ENCOUNTER — Other Ambulatory Visit: Payer: Self-pay

## 2012-03-01 VITALS — BP 204/104 | HR 47 | Temp 98.6°F | Ht 70.0 in | Wt 169.8 lb

## 2012-03-01 DIAGNOSIS — I10 Essential (primary) hypertension: Secondary | ICD-10-CM

## 2012-03-01 DIAGNOSIS — R51 Headache: Secondary | ICD-10-CM | POA: Diagnosis not present

## 2012-03-01 DIAGNOSIS — Z96649 Presence of unspecified artificial hip joint: Secondary | ICD-10-CM | POA: Insufficient documentation

## 2012-03-01 DIAGNOSIS — Z79899 Other long term (current) drug therapy: Secondary | ICD-10-CM | POA: Insufficient documentation

## 2012-03-01 DIAGNOSIS — R110 Nausea: Secondary | ICD-10-CM

## 2012-03-01 DIAGNOSIS — R42 Dizziness and giddiness: Secondary | ICD-10-CM

## 2012-03-01 DIAGNOSIS — M169 Osteoarthritis of hip, unspecified: Secondary | ICD-10-CM | POA: Insufficient documentation

## 2012-03-01 DIAGNOSIS — Z8543 Personal history of malignant neoplasm of ovary: Secondary | ICD-10-CM | POA: Insufficient documentation

## 2012-03-01 DIAGNOSIS — M1610 Unilateral primary osteoarthritis, unspecified hip: Secondary | ICD-10-CM | POA: Insufficient documentation

## 2012-03-01 DIAGNOSIS — G43909 Migraine, unspecified, not intractable, without status migrainosus: Secondary | ICD-10-CM

## 2012-03-01 LAB — URINALYSIS, ROUTINE W REFLEX MICROSCOPIC
Bilirubin Urine: NEGATIVE
Glucose, UA: NEGATIVE mg/dL
Hgb urine dipstick: NEGATIVE
Ketones, ur: NEGATIVE mg/dL
Nitrite: NEGATIVE
Protein, ur: NEGATIVE mg/dL
Specific Gravity, Urine: 1.012 (ref 1.005–1.030)
Urobilinogen, UA: 0.2 mg/dL (ref 0.0–1.0)
pH: 7 (ref 5.0–8.0)

## 2012-03-01 LAB — URINE MICROSCOPIC-ADD ON

## 2012-03-01 LAB — GLUCOSE, CAPILLARY: Glucose-Capillary: 65 mg/dL — ABNORMAL LOW (ref 70–99)

## 2012-03-01 LAB — PREGNANCY, URINE: Preg Test, Ur: NEGATIVE

## 2012-03-01 MED ORDER — METOCLOPRAMIDE HCL 5 MG/ML IJ SOLN
10.0000 mg | Freq: Once | INTRAMUSCULAR | Status: AC
  Administered 2012-03-01: 10 mg via INTRAVENOUS
  Filled 2012-03-01: qty 2

## 2012-03-01 MED ORDER — KETOROLAC TROMETHAMINE 30 MG/ML IJ SOLN
30.0000 mg | Freq: Once | INTRAMUSCULAR | Status: AC
  Administered 2012-03-01: 30 mg via INTRAMUSCULAR

## 2012-03-01 MED ORDER — KETOROLAC TROMETHAMINE 30 MG/ML IJ SOLN
INTRAMUSCULAR | Status: AC
  Filled 2012-03-01: qty 1

## 2012-03-01 MED ORDER — PROMETHAZINE HCL 12.5 MG PO TABS
25.0000 mg | ORAL_TABLET | Freq: Once | ORAL | Status: AC
  Administered 2012-03-01: 25 mg via ORAL

## 2012-03-01 MED ORDER — DIPHENHYDRAMINE HCL 50 MG/ML IJ SOLN
25.0000 mg | Freq: Once | INTRAMUSCULAR | Status: AC
  Administered 2012-03-01: 25 mg via INTRAVENOUS
  Filled 2012-03-01: qty 1

## 2012-03-01 MED ORDER — KETOROLAC TROMETHAMINE 15 MG/ML IJ SOLN
15.0000 mg | Freq: Once | INTRAMUSCULAR | Status: AC
  Administered 2012-03-01: 15 mg via INTRAVENOUS
  Filled 2012-03-01: qty 1

## 2012-03-01 MED ORDER — ONDANSETRON HCL 4 MG/2ML IJ SOLN
4.0000 mg | Freq: Once | INTRAMUSCULAR | Status: AC
  Administered 2012-03-01: 4 mg via INTRAVENOUS
  Filled 2012-03-01: qty 2

## 2012-03-01 MED ORDER — SODIUM CHLORIDE 0.9 % IV BOLUS (SEPSIS)
1000.0000 mL | Freq: Once | INTRAVENOUS | Status: AC
  Administered 2012-03-01: 1000 mL via INTRAVENOUS

## 2012-03-01 MED ORDER — AMLODIPINE BESYLATE 10 MG PO TABS: 10.0000 mg | ORAL_TABLET | Freq: Every day | ORAL | Status: DC

## 2012-03-01 NOTE — Assessment & Plan Note (Signed)
Patient's  blood pressure is not well controlled. Today blood pressure is 186/100. She is taking propranolol for migraine headache prophylaxis which is also for controlling blood pressure. Given her bradycardia, will discontinue beta blocker and switch to amlodipine, 10 mg daily.

## 2012-03-01 NOTE — Telephone Encounter (Signed)
Problem has been going on since 12/2011 - feeling worse. Problems with sweating, h/a, nausea and dizzy. Pt thinks it is from mold and mildew. Appt given 03/01/12 2:15PM Dr Clyde Lundborg. Stanton Kidney Daizee Firmin RN 03/01/12 12N

## 2012-03-01 NOTE — Progress Notes (Addendum)
Patient ID: ADWOA AXE, female   DOB: 1976-06-20, 36 y.o.   MRN: [id number]  Subjective:   Patient ID: [name] female   DOB: Aug 09, 1976 36 y.o.   MRN: [id number]  HPI: [name] is a 36 y.o. with past medical history as outlined below, who presents for an acute visit.  Patient reports that she has been having dizziness and weakness for more than 2 mouth. She reports that she has a generalized weakness, dizziness, headache, sweating, light headed, nausea, blurry vision, poor appetite for more than 2 months. She also reports having bilateral leg and foot numbness, but without sensational change. She did not have any fall or injury in the past. She denies hearing loss or ear ringing. She does not feel like the room is spinning around her. Of note, she has chronic migraine headache. Patient was recently seen at a clinic on 02/22/12 for similar complaints. She was given  prescription of sumatriptan and propranolol. Patient reports that these two medications are effective in controlling her headache. Patient reports that her dizziness has not been getting any better and her general condition seems to be getting worse progressively.  Denies fever, chills, night sweating, cough, chest pain, SOB,  abdominal pain,diarrhea, constipation, dysuria, urgency, frequency, hematuria.    Past Medical History  Diagnosis Date  . Osteoarthritis of hip   . History of migraine headaches     Typical symptoms - bright lights, unilateral (starting behind her left ear), throbbing .  Marland Kitchen Surgical menopause 08/2007    On HRT. Occuring since 01/09 following TAH and R-salpingo-oophorectomy   . Tibial fracture 10/2008    Left - Sustained 2/2 fall down stairs. Patient is S/P closed internal fixation with Smith Nephew tibial nail locked proximal and distal  . Endometriosis     S/P TAH and salpingo-oophorectomy, right  . Chronic cough   . Gastritis     Thought due to chronic NSAID use 2/2 pain from  her congenital hip abnormality  . Epigastric pain     Chronic, thought 2/2 gastritis, EGD (07/14/2011) showing chronic duodenitis, H.pyloir neg // Repeat EGD (09/2011) - esophagealring, sessible polyp in stomach body, hiatal hernia - rec ad probiotic, await bx  . 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 With chronic OA of hip  . Vitamin d deficiency 11/2009    Vit D level 15 in 11/2009  . Closed fibular fracture 10/2008    Left - Sustained 2/2 fall down stairs (same fall as tibular fracture). S/P internal fixation.  . Hidradenitis suppurativa 05/2006  . Protrusio acetabuli     diagnosed at age 5  . Ovarian cancer 1995  . Headache     Migraines  . Duodenitis     EGD (07/14/2011) showing chronic duodenitis, H.pyloir neg  . Esophageal ring     s/p dilatation during EGD (07/2011) - Dr. Darrick Penna  . Anxiety     pt denies having dx of anxiety   Current Facility-Administered Medications  Medication Dose Route Frequency Provider Last Rate Last Dose  . ketorolac (TORADOL) 30 MG/ML injection 30 mg  30 mg Intramuscular Once Lorretta Harp, MD   30 mg at 03/01/12 1647  . promethazine (PHENERGAN) tablet 25 mg  25 mg Oral Once Lorretta Harp, MD   25 mg at 03/01/12 1648   Current Outpatient Prescriptions  Medication Sig Dispense Refill  . amLODipine (NORVASC) 10 MG tablet Take 1 tablet (10 mg total)  by mouth daily.  30 tablet  3  . PARoxetine (PAXIL CR) 25 MG 24 hr tablet Take 1 tablet (25 mg total) by mouth every morning.  30 tablet  6  . SUMAtriptan (IMITREX) 50 MG tablet Take 50 mg by mouth every 2 (two) hours as needed. For headache      . Vitamin D, Ergocalciferol, (DRISDOL) 50000 UNITS CAPS Take 50,000 Units by mouth every 7 (seven) days. Every wednesday       Facility-Administered Medications Ordered in Other Visits  Medication Dose Route Frequency Provider Last Rate Last Dose  . ondansetron (ZOFRAN) injection 4 mg  4 mg Intravenous  Once Raeford Razor, MD   4 mg at 03/01/12 1838  . sodium chloride 0.9 % bolus 1,000 mL  1,000 mL Intravenous Once Raeford Razor, MD   1,000 mL at 03/01/12 1838   Family History  Problem Relation Age of Onset  . Diabetes type II    . Arthritis    . Breast cancer Sister 9    remission  . Cancer Sister     breast  . Colon cancer Neg Hx   . Liver disease Neg Hx   . Diabetes Other   . Cancer Other     colon cancer  . Lupus Cousin    History   Social History  . Marital Status: Married    Spouse Name: N/A    Number of Children: 2  . Years of Education: bs degree   Occupational History  . Stay-at-home mom    Social History Main Topics  . Smoking status: Never Smoker   . Smokeless tobacco: None  . Alcohol Use: No  . Drug Use: No  . Sexually Active: None   Other Topics Concern  . None   Social History Narrative   Insurance: Medicare.Patient is married with two children, Ava and Brentford.    Review of Systems:  General: no fevers, chills, no changes in body weight Skin: no rash HEENT: has blurry vision, no hearing changes or sore throat Pulm: no dyspnea, coughing, wheezing CV: no chest pain, palpitations, shortness of breath Abd: has nausea, No vomiting, abdominal pain, diarrhea/constipation GU: no dysuria, hematuria, polyuria Ext: has bilateral hip pain. Neuro: has weakness and numbness in legs bilaterally   Objective:  Physical Exam: Filed Vitals:   03/01/12 1447 03/01/12 1449 03/01/12 1655 03/01/12 1720  BP: 185/113 159/117 205/106 204/104  Pulse: 51 59 56 47  Temp:      TempSrc:      Height:      Weight:        General: resting in bed, not in acute distress HEENT: PERRL, EOMI, no scleral icterus Cardiac: S1/S2, RRR, No murmurs, gallops or rubs Pulm: Good air movement bilaterally, Clear to auscultation bilaterally, No rales, wheezing, rhonchi or rubs. Abd: Soft,  nondistended, nontender, no rebound pain, no organomegaly, BS present Ext: No rashes  or edema, 2+DP/PT pulse bilaterally Musculoskeletal: No joint deformities, erythema, or stiffness. Has tenderness over her hip joint bilaterally.  Skin: no rashes. No skin bruise. Neuro: alert and oriented X3, cranial nerves II-XII grossly intact, has muscle weakness in all extremities with muscle strength 4/5 in all extremeties,  sensation to light touch intact. Normal finger to nose test. Negative Babinski's sign. 2+ brachial reflexes bilaterally. 4+ knee reflexes bilaterally.   Psych.: patient is not psychotic, no suicidal or hemocidal ideation.  Assessment & Plan:    After Toradol and Phenergan injection,  patient was observed in examination room.  Patient reports her dizziness was getting worse and felt generally weaker. She said she was going to pass out. And blood pressure was measured which showed 205/106. Stat EKG was obtained which showed bradycardia. I discussed with Dr. Kem Kays, who suggested to transfer patient to ED. I called Triage Nurse who agreed to take patient. Patient was then transferred to ED.

## 2012-03-01 NOTE — ED Notes (Signed)
Feeling bad for several days; went to outpatient clinics because of symptoms; while at outpatient clinics, pt. Became dizzy and hypertensive. C/o generalized h/a all over. Blurred vision when lights on.

## 2012-03-01 NOTE — ED Notes (Signed)
Received report from Ed, RN 

## 2012-03-01 NOTE — Assessment & Plan Note (Signed)
Patient's migraine headache is not her major concerns today. She reports that sumatriptan is effective in controlling her headache. Will continue sumatriptan. We'll discontinue propranolol due to bradycardia

## 2012-03-01 NOTE — Patient Instructions (Signed)
1. I discontinued your propranolol, please stop taking this medication. I give you a new prescription for your blood pressure, amlodipine please take this medication from now. 2. Please take all medications as prescribed.  3. If you have worsening of your symptoms or new symptoms arise, please call the clinic ([phone]), or go to the ER immediately if symptoms are severe.

## 2012-03-01 NOTE — Assessment & Plan Note (Addendum)
The etiology for patient's constellation of symptoms of fatigue, dizziness, sweating, headache, blurry vision, nausea and lightheadedness is not clear currently. Her migraine headache may partially explain her symptoms, however patient reports that Sumatriptan is effective in controlling her headache, but without any effects on her dizziness and other symptoms. Today patient's blood pressure is elevated with a blood pressure of 186/100 and heart rate was 49. Patient is currently taking propranolol for migraine headache prophylaxis. Her bradycardia may also partially explain her dizziness, however patient had dizziness before starting taking propranolol. Of note,  the patient reports that her uncle of father side has multiple sclerosis (detail not known by patient). This family history may put patient at increased risk for multiple sclerosis. Although patient doesn't have typical symptoms for multiple sclerosis, such as double vision and recurrent or relapsed neurological symptoms, some of her current symptoms and the neurological findings are concerning for neurological problem. Patient's physical examination showed increased knee reflexes bilaterally. She has generalized weakness in all her muscles.  -Will discontinue beta blocker and switch to amlodipine for blood pressure control. -Will give Toradol injection for headache and Phenergan for nausea -Will give referral to neurology for further evaluation. Patient is not a candidate for MRI because of hip surgery with metal in placed.

## 2012-03-02 LAB — GLUCOSE, CAPILLARY: Glucose-Capillary: 57 mg/dL — ABNORMAL LOW (ref 70–99)

## 2012-03-03 ENCOUNTER — Telehealth: Payer: Self-pay | Admitting: *Deleted

## 2012-03-03 ENCOUNTER — Emergency Department (HOSPITAL_COMMUNITY)
Admission: EM | Admit: 2012-03-03 | Discharge: 2012-03-03 | Disposition: A | Discharge status: Home / Self Care | Payer: Managed Care, Other (non HMO) | Attending: Emergency Medicine | Admitting: Emergency Medicine

## 2012-03-03 ENCOUNTER — Encounter (HOSPITAL_COMMUNITY): Payer: Self-pay | Admitting: Emergency Medicine

## 2012-03-03 ENCOUNTER — Emergency Department (HOSPITAL_COMMUNITY): Payer: Managed Care, Other (non HMO)

## 2012-03-03 DIAGNOSIS — F29 Unspecified psychosis not due to a substance or known physiological condition: Secondary | ICD-10-CM | POA: Insufficient documentation

## 2012-03-03 DIAGNOSIS — R509 Fever, unspecified: Secondary | ICD-10-CM | POA: Insufficient documentation

## 2012-03-03 DIAGNOSIS — R51 Headache: Secondary | ICD-10-CM | POA: Diagnosis not present

## 2012-03-03 DIAGNOSIS — H53149 Visual discomfort, unspecified: Secondary | ICD-10-CM | POA: Insufficient documentation

## 2012-03-03 DIAGNOSIS — R110 Nausea: Secondary | ICD-10-CM | POA: Insufficient documentation

## 2012-03-03 MED ORDER — KETOROLAC TROMETHAMINE 30 MG/ML IJ SOLN
30.0000 mg | Freq: Once | INTRAMUSCULAR | Status: AC
  Administered 2012-03-03: 30 mg via INTRAMUSCULAR
  Filled 2012-03-03: qty 1

## 2012-03-03 MED ORDER — PROMETHAZINE HCL 25 MG PO TABS: 25.0000 mg | ORAL_TABLET | Freq: Four times a day (QID) | ORAL | Status: DC | PRN

## 2012-03-03 MED ORDER — DEXAMETHASONE SODIUM PHOSPHATE 10 MG/ML IJ SOLN
10.0000 mg | Freq: Once | INTRAMUSCULAR | Status: AC
  Administered 2012-03-03: 10 mg via INTRAMUSCULAR
  Filled 2012-03-03: qty 1

## 2012-03-03 MED ORDER — DIPHENHYDRAMINE HCL 50 MG/ML IJ SOLN
25.0000 mg | Freq: Once | INTRAMUSCULAR | Status: AC
  Administered 2012-03-03: 25 mg via INTRAMUSCULAR
  Filled 2012-03-03: qty 1

## 2012-03-03 MED ORDER — METOCLOPRAMIDE HCL 5 MG/ML IJ SOLN
10.0000 mg | Freq: Once | INTRAMUSCULAR | Status: AC
  Administered 2012-03-03: 10 mg via INTRAVENOUS
  Filled 2012-03-03: qty 2

## 2012-03-03 NOTE — ED Notes (Signed)
Pt denies any further questions, reports decrease pain upon discharge.

## 2012-03-03 NOTE — ED Notes (Signed)
Pt refused CBG and requested to speak with charge RN regarding wait for treatment room.  Gloris Manchester, Consulting civil engineer notified of same.

## 2012-03-03 NOTE — ED Provider Notes (Signed)
History     CSN: [id number]  Arrival date & time 03/03/12  1714   None     Chief Complaint  Patient presents with  . Headache    (Consider location/radiation/quality/duration/timing/severity/associated sxs/prior treatment) HPI Comments: Patient presents with a history of migraine headaches since May 2013. She says this migraine started suddenly in May 2013 and has been constant and progressively gotten worse, especially in the past 5 days. She describes the headache as generalized throbbing that becomes worse with head movement. She rates the pain 10/10. Patient describes nausea, loss of balance, dizziness, slurred speech, and memory loss since the onset of her headache. She was prescribed sumitriptan by her primary care doctor which does not help. She did receive a dose of benedryl at a previous ED visit which mildly relieved her symptoms.   Patient is a 36 y.o. female presenting with headaches.  Headache  Associated symptoms include a fever and nausea. Pertinent negatives include no shortness of breath and no vomiting.    Past Medical History  Diagnosis Date  . Osteoarthritis of hip   . History of migraine headaches     Typical symptoms - bright lights, unilateral (starting behind her left ear), throbbing .  Marland Kitchen Surgical menopause 08/2007    On HRT. Occuring since 01/09 following TAH and R-salpingo-oophorectomy   . Tibial fracture 10/2008    Left - Sustained 2/2 fall down stairs. Patient is S/P closed internal fixation with Smith Nephew tibial nail locked proximal and distal  . Endometriosis     S/P TAH and salpingo-oophorectomy, right  . Chronic cough   . Gastritis     Thought due to chronic NSAID use 2/2 pain from her congenital hip abnormality  . Epigastric pain     Chronic, thought 2/2 gastritis, EGD (07/14/2011) showing chronic duodenitis, H.pyloir neg // Repeat EGD (09/2011) - esophagealring, sessible polyp in stomach body, hiatal hernia - rec ad probiotic, await bx  . 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 With chronic OA of hip  . Vitamin d deficiency 11/2009    Vit D level 15 in 11/2009  . Closed fibular fracture 10/2008    Left - Sustained 2/2 fall down stairs (same fall as tibular fracture). S/P internal fixation.  . Hidradenitis suppurativa 05/2006  . Protrusio acetabuli     diagnosed at age 63  . Ovarian cancer 1995  . Headache     Migraines  . Duodenitis     EGD (07/14/2011) showing chronic duodenitis, H.pyloir neg  . Esophageal ring     s/p dilatation during EGD (07/2011) - Dr. Darrick Penna  . Anxiety     pt denies having dx of anxiety    Past Surgical History  Procedure Date  . Cesarean section 08/2005    Primary low transverse  . Tubal ligation 08/2005    Right tubal ligation  . Laparoscopy 10/2000    Operative laparoscopy with lysis of right adnexal adhesions and uterointestinal adhesions  . Other surgical history 02/2009    Closed treatment internal fixation, left tibia with Katrinka Blazing and Nephew tibial nail locked proximal and distal  . Total abdominal hysterectomy w/ bilateral salpingoophorectomy 08/2007    for endometriosis. With concurrent right salpingo-oophorectomy  (2009), left oophorectomy (1995)  . Left tib fib   . Joint replacement     right hip  . Fracture surgery     Right hip  . Colonoscopy 09/22/2011    Procedure:  COLONOSCOPY;  Surgeon: Arlyce Harman, MD;  Location: [facility];  Service: Endoscopy;  Laterality: N/A;  9:15    Family History  Problem Relation Age of Onset  . Diabetes type II    . Arthritis    . Breast cancer Sister 52    remission  . Cancer Sister     breast  . Colon cancer Neg Hx   . Liver disease Neg Hx   . Diabetes Other   . Cancer Other     colon cancer  . Lupus Cousin     History  Substance Use Topics  . Smoking status: Never Smoker   . Smokeless tobacco: Not on file  . Alcohol Use: No    OB History    Grav Para  Term Preterm Abortions TAB SAB Ect Mult Living                  Review of Systems  Constitutional: Positive for fever, chills, appetite change and fatigue.  HENT: Negative for congestion, postnasal drip and sinus pressure.   Eyes: Positive for photophobia and visual disturbance.  Respiratory: Negative for cough, shortness of breath and wheezing.   Cardiovascular: Negative for chest pain.  Gastrointestinal: Positive for nausea. Negative for vomiting, abdominal pain and diarrhea.  Musculoskeletal: Positive for gait problem.  Neurological: Positive for dizziness and headaches. Negative for seizures and numbness.  Psychiatric/Behavioral: Positive for confusion.    Allergies  Pineapple; Morphine and related; and Tape  Home Medications   Current Outpatient Rx  Name Route Sig Dispense Refill  . AMLODIPINE BESYLATE 10 MG PO TABS Oral Take 10 mg by mouth daily.    . SUMATRIPTAN SUCCINATE 50 MG PO TABS Oral Take 50 mg by mouth every 2 (two) hours as needed. For headache    . VITAMIN D (ERGOCALCIFEROL) 50000 UNITS PO CAPS Oral Take 50,000 Units by mouth every 7 (seven) days. Every wednesday      BP 175/99  Pulse 68  Temp 99 F (37.2 C) (Oral)  Resp 18  SpO2 100%  Physical Exam  Nursing note and vitals reviewed. Constitutional: She appears well-developed and well-nourished.  HENT:  Head: Normocephalic and atraumatic.  Eyes: Conjunctivae are normal.       Patient deferred eye exam due to discomfort of light shining in her eyes. Unable to assess reactivity of pupils.   Neck: Normal range of motion.  Cardiovascular: Normal rate, regular rhythm and intact distal pulses.  Exam reveals no gallop and no friction rub.   No murmur heard. Pulmonary/Chest: Effort normal and breath sounds normal. She has no wheezes. She has no rales. She exhibits no tenderness.  Abdominal: Soft. She exhibits no distension. There is no tenderness. There is no rebound.  Musculoskeletal: Normal range of motion.    Neurological: She is alert. No cranial nerve deficit.       Unable to assess extraocular movement due to patient deferring exam.   Skin: Skin is warm and dry.  Psychiatric: She has a normal mood and affect.    ED Course  Procedures (including critical care time)  Labs Reviewed - No data to display Ct Head Wo Contrast  03/03/2012  *RADIOLOGY REPORT*  Clinical Data:  Severe headache.  CT HEAD WITHOUT CONTRAST  Technique:  Contiguous axial images were obtained from the base of the skull through the vertex without contrast  Comparison:  02/19/2009  Findings:  The brain has a normal appearance without evidence for hemorrhage, acute infarction, hydrocephalus, or mass  lesion.  There is no extra axial fluid collection.  The skull and paranasal sinuses are normal.  IMPRESSION: Normal CT of the head without contrast.  Original Report Authenticated By: Reola Calkins, M.D.     No diagnosis found.    MDM  9:21 PM Patient evaluated and expresses headache. Will give migraine cocktail and CT head.   10:28 PM CT of head is normal. Patient just received migraine cocktail. Will check back to see if any relief.   11:11 PM Patient is feeling better after migraine cocktail and toradol. I spoke with the patient and explained that a more thorough evaluation of of her headache should be done at an outpatient clinic. Patient will be discharged and advised to follow up with a Neurologist.   The patient's workup in the ED is negative for any acute process shown on head CT. Patient will be discharged with Phenergan for migraine relief. Patient advised to follow up with Dr. Anne Hahn for further evaluation of migraines. Plan discussed with Dr. Ignacia Palma and the patient and both are agreeable.     Emilia Beck, PA-C 03/03/12 2326

## 2012-03-03 NOTE — ED Notes (Addendum)
C/o intermittent headache, diaphoresis, and nausea since May.  States she was seen in ED on Monday for same.  Pt states she went to [facility] to be seen and was told to come to ED because they were closing.

## 2012-03-03 NOTE — Telephone Encounter (Signed)
Pt calls and states her h/a continues and has become worse, 10/10, head pounding, disoriented, nausea, no vomiting, + dizziness, blurred vision and "fluttery things", weak, gait is off- stumbling, she is very upset with how this has been handled. i am sending her to ED via ems. Will notify 1600 pager and ED

## 2012-03-03 NOTE — ED Notes (Signed)
Pt states [facility] was suppose to call ED and she was suppose to have a room waiting on her on arrival.  Unaware of pt and pt informed of triage process.

## 2012-03-03 NOTE — Telephone Encounter (Signed)
Agree with plan. Patient was recently seen in our office, noted to have severe migraine headache and a presyncopal episode w/ noted orthostatic hypotension. She was sent to the ED and treated for severe migraine. This may represent status migrainosus.

## 2012-03-03 NOTE — Progress Notes (Signed)
Medical screening examination/treatment/procedure(s) were conducted as a shared visit with non-physician practitioner(s) and myself.  I personally evaluated the patient during the encounter 36 year old lady with migraine headaches.  Has had treatment with sumatriptan, but that is no longer seeming to be effective.  Exam is benign and head CT is negative.  Feels better after migraine cocktail.  Needs neurology followup; given name of Dr. Lesia Sago, neurologist on call.

## 2012-03-04 ENCOUNTER — Telehealth: Payer: Self-pay | Admitting: *Deleted

## 2012-03-04 NOTE — ED Provider Notes (Signed)
Medical screening examination/treatment/procedure(s) were conducted as a shared visit with non-physician practitioner(s) and myself. I personally evaluated the patient during the encounter  36 year old lady with migraine headaches. Has had treatment with sumatriptan, but that is no longer seeming to be effective. Exam is benign and head CT is negative. Feels better after migraine cocktail. Needs neurology followup; given name of Dr. Lesia Sago, neurologist on call.   Carleene Cooper III, MD 03/04/12 1236

## 2012-03-04 NOTE — Telephone Encounter (Signed)
Pt calls and is unhappy with her ED visit as well, she states that she was told that there was not an entry in her chart for her ph call and being sent to ED, i read her the note and the recorded time on the note. Afterwards she c/o dr Clyde Lundborg asking her multiple times if she "uses cocaine", i apologized for all this and explained that i had spoken to the attending and mr shaw also cindy hyatt in pt experience about this. She also states she never wants to see dr Clyde Lundborg in clinic or hospital again. i apologized for her experience in clinic and the Ed and assured her that we at [facility] want her to have pleasant experiences in every situation and if there is ever anything i can assist her with to feel free to call me.  i don't think this is resolved completely

## 2012-03-05 DIAGNOSIS — Z96643 Presence of artificial hip joint, bilateral: Secondary | ICD-10-CM | POA: Insufficient documentation

## 2012-03-05 NOTE — Telephone Encounter (Signed)
Let me know if there is anything I can do to help, please let me know.  Johnette Abraham, D.O.

## 2012-03-05 NOTE — ED Provider Notes (Signed)
History      CSN: [id number]  Arrival date & time 03/01/12  1804   First MD Initiated Contact with Patient 03/01/12 1827      No chief complaint on file.   (Consider location/radiation/quality/duration/timing/severity/associated sxs/prior treatment) Patient is a 36 y.o. female presenting with headaches.  Headache  This is a chronic problem. The current episode started more than 2 days ago. The problem occurs constantly. The problem has not changed since onset.The headache is associated with bright light. The quality of the pain is described as dull and throbbing. The pain is moderate. The pain does not radiate. Pertinent negatives include no fever and no vomiting.    Past Medical History  Diagnosis Date  . Osteoarthritis of hip   . History of migraine headaches     Typical symptoms - bright lights, unilateral (starting behind her left ear), throbbing .  Marland Kitchen Surgical menopause 08/2007    On HRT. Occuring since 01/09 following TAH and R-salpingo-oophorectomy   . Tibial fracture 10/2008    Left - Sustained 2/2 fall down stairs. Patient is S/P closed internal fixation with Smith Nephew tibial nail locked proximal and distal  . Endometriosis     S/P TAH and salpingo-oophorectomy, right  . Chronic cough   . Gastritis     Thought due to chronic NSAID use 2/2 pain from her congenital hip abnormality  . Epigastric pain     Chronic, thought 2/2 gastritis, EGD (07/14/2011) showing chronic duodenitis, H.pyloir neg // Repeat EGD (09/2011) - esophagealring, sessible polyp in stomach body, hiatal hernia - rec ad probiotic, await bx  . 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 With chronic OA of hip  . Vitamin d deficiency 11/2009    Vit D level 15 in 11/2009  . Closed fibular fracture 10/2008    Left - Sustained 2/2 fall down stairs (same fall as tibular fracture). S/P internal fixation.  . Hidradenitis suppurativa 05/2006  .  Protrusio acetabuli     diagnosed at age 70  . Ovarian cancer 1995  . Headache     Migraines  . Duodenitis     EGD (07/14/2011) showing chronic duodenitis, H.pyloir neg  . Esophageal ring     s/p dilatation during EGD (07/2011) - Dr. Darrick Penna  . Anxiety     pt denies having dx of anxiety    Past Surgical History  Procedure Date  . Cesarean section 08/2005    Primary low transverse  . Tubal ligation 08/2005    Right tubal ligation  . Laparoscopy 10/2000    Operative laparoscopy with lysis of right adnexal adhesions and uterointestinal adhesions  . Other surgical history 02/2009    Closed treatment internal fixation, left tibia with Katrinka Blazing and Nephew tibial nail locked proximal and distal  . Total abdominal hysterectomy w/ bilateral salpingoophorectomy 08/2007    for endometriosis. With concurrent right salpingo-oophorectomy  (2009), left oophorectomy (1995)  . Left tib fib   . Joint replacement     right hip  . Fracture surgery     Right hip  . Colonoscopy 09/22/2011    Procedure: COLONOSCOPY;  Surgeon: Arlyce Harman, MD;  Location: [facility];  Service: Endoscopy;  Laterality: N/A;  9:15    Family History  Problem Relation Age of Onset  . Diabetes type II    . Arthritis    . Breast cancer Sister 65    remission  . Cancer Sister  breast  . Colon cancer Neg Hx   . Liver disease Neg Hx   . Diabetes Other   . Cancer Other     colon cancer  . Lupus Cousin     History  Substance Use Topics  . Smoking status: Never Smoker   . Smokeless tobacco: Not on file  . Alcohol Use: No    OB History    Grav Para Term Preterm Abortions TAB SAB Ect Mult Living                  Review of Systems  Constitutional: Negative for fever.  Gastrointestinal: Negative for vomiting.  Neurological: Positive for headaches.    Review of symptoms negative unless otherwise noted in HPI.   Allergies  Pineapple; Morphine and related; and Tape  Home Medications   Current  Outpatient Rx  Name Route Sig Dispense Refill  . SUMATRIPTAN SUCCINATE 50 MG PO TABS Oral Take 50 mg by mouth every 2 (two) hours as needed. For headache    . VITAMIN D (ERGOCALCIFEROL) 50000 UNITS PO CAPS Oral Take 50,000 Units by mouth every 7 (seven) days. Every wednesday    . AMLODIPINE BESYLATE 10 MG PO TABS Oral Take 10 mg by mouth daily.    Marland Kitchen PROMETHAZINE HCL 25 MG PO TABS Oral Take 1 tablet (25 mg total) by mouth every 6 (six) hours as needed for nausea. 12 tablet 0    BP 123/66  Pulse 55  Temp 98.7 F (37.1 C) (Oral)  Resp 18  SpO2 100%  Physical Exam  Nursing note and vitals reviewed. Constitutional: She is oriented to person, place, and time. She appears well-developed and well-nourished. No distress.  HENT:  Head: Normocephalic and atraumatic.  Eyes: Conjunctivae and EOM are normal. Pupils are equal, round, and reactive to light. Right eye exhibits no discharge. Left eye exhibits no discharge.  Neck: Normal range of motion. Neck supple.  Cardiovascular: Normal rate, regular rhythm and normal heart sounds.  Exam reveals no gallop and no friction rub.   No murmur heard. Pulmonary/Chest: Effort normal and breath sounds normal. No respiratory distress.  Abdominal: Soft. She exhibits no distension. There is no tenderness.  Musculoskeletal: She exhibits no edema and no tenderness.  Lymphadenopathy:    She has no cervical adenopathy.  Neurological: She is alert and oriented to person, place, and time. No cranial nerve deficit. She exhibits normal muscle tone. Coordination normal.       Gait steady. Good finger to nose b/l.  Skin: Skin is warm and dry.  Psychiatric: She has a normal mood and affect. Her behavior is normal. Thought content normal.    ED Course  Procedures (including critical care time)  Labs Reviewed  URINALYSIS, ROUTINE W REFLEX MICROSCOPIC - Abnormal; Notable for the following:    Leukocytes, UA TRACE (*)     All other components within normal limits    URINE MICROSCOPIC-ADD ON - Abnormal; Notable for the following:    Squamous Epithelial / LPF FEW (*)     All other components within normal limits  GLUCOSE, CAPILLARY - Abnormal; Notable for the following:    Glucose-Capillary 57 (*)     All other components within normal limits  PREGNANCY, URINE  LAB REPORT - SCANNED   Ct Head Wo Contrast  03/03/2012  *RADIOLOGY REPORT*  Clinical Data:  Severe headache.  CT HEAD WITHOUT CONTRAST  Technique:  Contiguous axial images were obtained from the base of the skull through the vertex without  contrast  Comparison:  02/19/2009  Findings:  The brain has a normal appearance without evidence for hemorrhage, acute infarction, hydrocephalus, or mass lesion.  There is no extra axial fluid collection.  The skull and paranasal sinuses are normal.  IMPRESSION: Normal CT of the head without contrast.  Original Report Authenticated By: Reola Calkins, M.D.     1. Headache       MDM  [phone] with headache. Suspect primary HA. Consider emergent secondary causes such as bleed, infectious or mass but doubt. There is no history of trauma. Pt has a nonfocal neurological exam. Afebrile and neck supple. No use of blood thinning medication. Consider ocular etiology such as acute angle closure glaucoma but doubt. Pt denies acute change in visual acuity and eye exam unremarkable. Doubt temporal arteritis given age, no temporal tenderness and temporal artery pulsations palpable. Doubt CO poisoning. No contacts with similar symptoms. Doubt venous thrombosis. Doubt carotid or vertebral arteries dissection. Symptoms improved with meds. Feel that can be safely discharged, but strict return precautions discussed. Outpt fu.         Raeford Razor, MD 03/05/12 [id number]

## 2012-03-10 ENCOUNTER — Ambulatory Visit (HOSPITAL_COMMUNITY): Payer: Managed Care, Other (non HMO) | Admitting: Physical Therapy

## 2012-03-12 NOTE — Telephone Encounter (Signed)
I saw her with Dr Shirlee Latch and I do not remember her being unreasonable but she did have a bad HA. She has appt dr Saralyn Pilar on Sep 5th. Would you please try to move a pt (either the pt sch at the same time or pt sch afterwards) to basically give her two appt slots so that Dr Saralyn Pilar isn't rushed and can deal with her appropriately? Thanks

## 2012-03-16 ENCOUNTER — Ambulatory Visit (HOSPITAL_COMMUNITY)
Admission: RE | Admit: 2012-03-16 | Discharge: 2012-03-16 | Payer: Medicare Other | Source: Ambulatory Visit | Attending: Physical Therapy | Admitting: Physical Therapy

## 2012-03-16 NOTE — Progress Notes (Signed)
Pt had a referred for PT services which read: Please evaluate [name] for admission to Home Health. Following a L THR on 03/05/12.  Followed up w/MD practice who states she should have HHPT, not OPPT.  Discussed further with practice to set up HHPT.  At this time I left a message with their social worker to set up HHPT.  However in their system they have her primary insurance as Community education officer, which does not cover any HHPT in her region.  Discussed with them to call her as we have medicare as her primary insurance.  Unsure at this time if pt will have to continue with OP PT services or w/HHPT services.  Objective information: Walks in today w/RW, needs mod cueing for TDWB, as pt is putting most of her weight through her L LE.  Pt able to independently verbalize posterior hip precautions.    She left with her Aunt and young daughter today and discussed she needed to receive HHPT.

## 2012-03-19 DIAGNOSIS — M1612 Unilateral primary osteoarthritis, left hip: Secondary | ICD-10-CM | POA: Insufficient documentation

## 2012-03-30 DIAGNOSIS — G43009 Migraine without aura, not intractable, without status migrainosus: Secondary | ICD-10-CM | POA: Diagnosis not present

## 2012-03-30 DIAGNOSIS — R413 Other amnesia: Secondary | ICD-10-CM | POA: Diagnosis not present

## 2012-04-14 ENCOUNTER — Other Ambulatory Visit: Payer: Self-pay | Admitting: *Deleted

## 2012-04-15 ENCOUNTER — Encounter: Payer: Managed Care, Other (non HMO) | Admitting: Internal Medicine

## 2012-04-16 MED ORDER — PROMETHAZINE HCL 25 MG PO TABS: 25.0000 mg | ORAL_TABLET | Freq: Four times a day (QID) | ORAL | Status: DC | PRN

## 2012-04-20 ENCOUNTER — Encounter: Payer: Self-pay | Admitting: Internal Medicine

## 2012-04-27 ENCOUNTER — Other Ambulatory Visit (HOSPITAL_COMMUNITY): Payer: Self-pay | Admitting: Internal Medicine

## 2012-04-27 DIAGNOSIS — Z1231 Encounter for screening mammogram for malignant neoplasm of breast: Secondary | ICD-10-CM

## 2012-06-07 ENCOUNTER — Ambulatory Visit (HOSPITAL_COMMUNITY): Payer: Managed Care, Other (non HMO)

## 2012-06-22 ENCOUNTER — Ambulatory Visit (HOSPITAL_COMMUNITY)
Admission: RE | Admit: 2012-06-22 | Discharge: 2012-06-22 | Disposition: A | Discharge status: Home / Self Care | Payer: Managed Care, Other (non HMO) | Source: Ambulatory Visit | Attending: Internal Medicine | Admitting: Internal Medicine

## 2012-06-22 DIAGNOSIS — Z1231 Encounter for screening mammogram for malignant neoplasm of breast: Secondary | ICD-10-CM | POA: Insufficient documentation

## 2012-06-25 ENCOUNTER — Encounter: Payer: Self-pay | Admitting: Internal Medicine

## 2012-06-25 ENCOUNTER — Ambulatory Visit (INDEPENDENT_AMBULATORY_CARE_PROVIDER_SITE_OTHER): Payer: Managed Care, Other (non HMO) | Admitting: Internal Medicine

## 2012-06-25 VITALS — BP 150/101 | HR 62 | Temp 97.2°F | Ht 70.5 in | Wt 176.4 lb

## 2012-06-25 DIAGNOSIS — G8929 Other chronic pain: Secondary | ICD-10-CM

## 2012-06-25 DIAGNOSIS — M169 Osteoarthritis of hip, unspecified: Secondary | ICD-10-CM

## 2012-06-25 DIAGNOSIS — I10 Essential (primary) hypertension: Secondary | ICD-10-CM

## 2012-06-25 DIAGNOSIS — K2940 Chronic atrophic gastritis without bleeding: Secondary | ICD-10-CM

## 2012-06-25 DIAGNOSIS — K2950 Unspecified chronic gastritis without bleeding: Secondary | ICD-10-CM

## 2012-06-25 DIAGNOSIS — J309 Allergic rhinitis, unspecified: Secondary | ICD-10-CM | POA: Diagnosis not present

## 2012-06-25 DIAGNOSIS — M25559 Pain in unspecified hip: Secondary | ICD-10-CM

## 2012-06-25 DIAGNOSIS — G43909 Migraine, unspecified, not intractable, without status migrainosus: Secondary | ICD-10-CM | POA: Diagnosis not present

## 2012-06-25 MED ORDER — ACETAMINOPHEN-CODEINE #3 300-30 MG PO TABS: 1.0000 | ORAL_TABLET | Freq: Three times a day (TID) | ORAL | Status: DC | PRN

## 2012-06-25 MED ORDER — CETIRIZINE HCL 10 MG PO TABS: 10.0000 mg | ORAL_TABLET | Freq: Every day | ORAL | Status: DC

## 2012-06-25 MED ORDER — OMEPRAZOLE 20 MG PO CPDR: 20.0000 mg | DELAYED_RELEASE_CAPSULE | Freq: Every day | ORAL | Status: DC

## 2012-06-25 MED ORDER — ACETAMINOPHEN-CODEINE #3 300-30 MG PO TABS: 1.0000 | ORAL_TABLET | ORAL | Status: DC | PRN

## 2012-06-25 NOTE — Assessment & Plan Note (Signed)
Patient has never been on PPI. Unclear if mild chronic gastritis itself needs PPI- but as patient has what seems like mild exacerbation of her gastritis now, we'll give short course of PPI- for 8 weeks. - Prilosec 20 mg daily for 8 weeks total. - Reevaluate at next visit for long term need of PPI.

## 2012-06-25 NOTE — Assessment & Plan Note (Signed)
Patient follows up with Dr. Marcy Panning for pain. Also had hip replacement last year. Some acute worsening of pain following possible upper respiratory viral etiology.  No signs of joint infection at this point of time.  after discussing  with patient- give her Tylenol No. 3 for symptom control to avoid using much ibuprofen which would be contributing with PPI for treatment of her gastritis. - She will use Tylenol No. 3 for breakthrough pain as needed.

## 2012-06-25 NOTE — Assessment & Plan Note (Signed)
As described in history of present illness and physical exam patient seems to have allergic sinusitis versus viral ( recent sick contact). - Symptomatic management with antihistamines. Zyrtec prescribed. - Discussing with Dr. Rogelia Boga- if she does not get better, will need to give nasal steroid spray.

## 2012-06-25 NOTE — Progress Notes (Signed)
  Subjective:    Patient ID: [name], female    DOB: [birth year], 36 y.o.   MRN: [id number]  HPI patient is a pleasant 36 year woman with hip osteoarthritis due to congenital abnormality status post replacement, mild chronic gastritis and other medical issues as per problem list who comes to the clinic for worsening right hip pain, sinus congestion and abdominal pain. She says that all above symptoms started about 2 weeks before.  Inciting events possibly were- eating beef and popcorn- which she says she cannot tolerate and also her kid was sick and had respiratory problems.  Right hip pain- he reports worsening of pain for past 2 weeks. She had surgery last year and was stable after that until 2 weeks before. She reported similar kind of pain which she always had. The pain doesn't radiate down to the leg but does radiate to the back and to the other hip. She has some trouble walking but is still able to ambulate. Ibuprofen helps somewhat. Denies any knee pain, wrist pain, fingers or toe pain.  Sinus congestion: Patient reports sinus congestion for past 2 weeks after she had a sick contact with her kid. She reports having mild bilateral maxillary pain along with itching in both eyes . She reports mild sore throat. She also reports some chest tightness but no chest pain. She does not have any fever, cough, wheezing, short of breath.   epigastric abdominal pain: Patient reports having epigastric abdominal pain for past 2 weeks which is similar to what she had when she got the upper endoscopy  in February 2013.  She was found to haveesophageal ring and mild chronic gastritis. The ring was dilated. She felt better after that procedure until 2 weeks before. She does not report increased ibuprofen use lately .  For her migraine - which is stable , she uses ibuprofen about twice a week and Imitrex twice a week.  She denies nausea vomiting, palpitations, diarrhea.   Review of Systems    As per  history of present illness.  Objective:   Physical Exam  General: NAD HEENT: PERRL, EOMI, no scleral icterus, no redness. Mild hyperemia of uvula and oropharynx. No exudates. Cardiac: S1, S2, RRR, no rubs, murmurs or gallops Pulm: clear to auscultation bilaterally, moving normal volumes of air Abd: soft, nontender, nondistended, BS present Ext: warm and well perfused, no pedal edema Neuro: alert and oriented X3, cranial nerves II-XII grossly intact       Assessment & Plan:

## 2012-06-25 NOTE — Assessment & Plan Note (Signed)
Patient not taking amlodipine. She says she had some weird reaction. Blood pressure mildly elevated. Patient not interested in getting new blood pressure medication today. - Reassessed during next visit.

## 2012-06-25 NOTE — Assessment & Plan Note (Signed)
Stable migraine headaches. Was supposed to get MRI of head after last visit with neurologist in September- but did not get it done. She says that neurologist office did not call her with appointment. Reviewing the notes from neurology, she was supposed to be seen back in the clinic by a nurse practitioner in 2-3 months. Patient unaware of this. Asked patient to call the clinic and see if she needs to go back to see them. Meanwhile advised to continue using Imitrex and ibuprofen as she does. Although also discussed with her long-term effects of ibuprofen use on GI and renal and advised to try to cut down as much as she can.

## 2012-06-25 NOTE — Patient Instructions (Addendum)
Please make a followup appointment as needed. Take Prilosec 1 tablet daily for stomach inflammation and see if that makes it better. Also take Tylenol No. 3- 1 tablet every 8 hours as needed for pain- to replace ibuprofen doses, which might help with stomach inflammation. Also take Zyrtec 10 mg one tablet daily for sinusitis. Do warm salt water gargles for symptomatic control of mild sore throat.  Call the neurologist office and see if you have to see them once.

## 2012-08-26 DIAGNOSIS — R413 Other amnesia: Secondary | ICD-10-CM | POA: Diagnosis not present

## 2012-08-26 DIAGNOSIS — G43009 Migraine without aura, not intractable, without status migrainosus: Secondary | ICD-10-CM | POA: Diagnosis not present

## 2012-08-27 ENCOUNTER — Other Ambulatory Visit: Payer: Self-pay | Admitting: Neurology

## 2012-08-27 ENCOUNTER — Emergency Department (INDEPENDENT_AMBULATORY_CARE_PROVIDER_SITE_OTHER)
Admission: EM | Admit: 2012-08-27 | Discharge: 2012-08-27 | Disposition: A | Discharge status: Home / Self Care | Payer: Managed Care, Other (non HMO) | Source: Home / Self Care

## 2012-08-27 ENCOUNTER — Encounter (HOSPITAL_COMMUNITY): Payer: Self-pay | Admitting: Emergency Medicine

## 2012-08-27 DIAGNOSIS — N3090 Cystitis, unspecified without hematuria: Secondary | ICD-10-CM | POA: Diagnosis not present

## 2012-08-27 DIAGNOSIS — R413 Other amnesia: Secondary | ICD-10-CM

## 2012-08-27 DIAGNOSIS — S339XXA Sprain of unspecified parts of lumbar spine and pelvis, initial encounter: Secondary | ICD-10-CM

## 2012-08-27 DIAGNOSIS — G43009 Migraine without aura, not intractable, without status migrainosus: Secondary | ICD-10-CM

## 2012-08-27 DIAGNOSIS — S39012A Strain of muscle, fascia and tendon of lower back, initial encounter: Secondary | ICD-10-CM

## 2012-08-27 LAB — POCT URINALYSIS DIP (DEVICE)
Ketones, ur: NEGATIVE mg/dL
Nitrite: NEGATIVE
Protein, ur: NEGATIVE mg/dL
Urobilinogen, UA: 0.2 mg/dL (ref 0.0–1.0)
pH: 6 (ref 5.0–8.0)

## 2012-08-27 MED ORDER — CEPHALEXIN 500 MG PO CAPS: 500.0000 mg | ORAL_CAPSULE | Freq: Four times a day (QID) | ORAL | Status: DC

## 2012-08-27 NOTE — ED Notes (Signed)
Waiting discharge papers 

## 2012-08-27 NOTE — ED Provider Notes (Signed)
Medical screening examination/treatment/procedure(s) were performed by resident physician or non-physician practitioner and as supervising physician I was immediately available for consultation/collaboration.   Shamarr Faucett DOUGLAS MD.    Naheem Mosco D Yoandri Congrove, MD 08/27/12 2029 

## 2012-08-27 NOTE — ED Notes (Signed)
Pt c/o lower back pain and pelvic discomfort. Nausea. Dysuria. Since Monday. Pt denies vaginal discharge and irritation.

## 2012-08-27 NOTE — ED Notes (Signed)
Pt given injection will wait 10 min. Then discharge.

## 2012-08-27 NOTE — ED Provider Notes (Signed)
History     CSN: [id number]  Arrival date & time 08/27/12  1613   First MD Initiated Contact with Patient 08/27/12 1725      Chief Complaint  Patient presents with  . Urinary Tract Infection    lower back pain and pelvic discomfort. dysuria. nausea.     (Consider location/radiation/quality/duration/timing/severity/associated sxs/prior treatment) HPI Comments: 37 year old female complaining of low back pain for 2 weeks. The pain is located the paralumbar musculature. It is worse with bending and getting up and down from a seated position. Is also complaining of pelvic pain. Pain is located in the right and left pelvis these chest adjacent to the suprapubic bone. She denies vaginal discharge. She has a history of a hysterectomy. Is also complaining of post voiding pain and nausea without vomiting. Denies fevers.  Patient is a 37 y.o. female presenting with urinary tract infection.  Urinary Tract Infection Pertinent negatives include no abdominal pain.    Past Medical History  Diagnosis Date  . Osteoarthritis of hip   . History of migraine headaches     Typical symptoms - bright lights, unilateral (starting behind her left ear), throbbing .  Marland Kitchen Surgical menopause 08/2007    On HRT. Occuring since 01/09 following TAH and R-salpingo-oophorectomy   . Tibial fracture 10/2008    Left - Sustained 2/2 fall down stairs. Patient is S/P closed internal fixation with Smith Nephew tibial nail locked proximal and distal  . Endometriosis     S/P TAH and salpingo-oophorectomy, right  . Chronic cough   . Gastritis     Thought due to chronic NSAID use 2/2 pain from her congenital hip abnormality  . Epigastric pain     Chronic, thought 2/2 gastritis, EGD (07/14/2011) showing chronic duodenitis, H.pyloir neg // Repeat EGD (09/2011) - esophagealring, sessible polyp in stomach body, hiatal hernia - rec ad probiotic, await bx  . Hip deformity, congenital     Protrusio acetabuli with articular sclerosis  and flattening of femoral heads. With chronic OA of hip  . Vitamin D deficiency 11/2009    Vit D level 15 in 11/2009  . Closed fibular fracture 10/2008    Left - Sustained 2/2 fall down stairs (same fall as tibular fracture). S/P internal fixation.  . Hidradenitis suppurativa 05/2006  . Protrusio acetabuli     diagnosed at age 47  . Ovarian cancer 1995  . Headache     Migraines  . Duodenitis     EGD (07/14/2011) showing chronic duodenitis, H.pyloir neg  . Esophageal ring     s/p dilatation during EGD (07/2011) - Dr. Darrick Penna  . Anxiety     pt denies having dx of anxiety    Past Surgical History  Procedure Date  . Cesarean section 08/2005    Primary low transverse  . Tubal ligation 08/2005    Right tubal ligation  . Laparoscopy 10/2000    Operative laparoscopy with lysis of right adnexal adhesions and uterointestinal adhesions  . Other surgical history 02/2009    Closed treatment internal fixation, left tibia with Katrinka Blazing and Nephew tibial nail locked proximal and distal  . Total abdominal hysterectomy w/ bilateral salpingoophorectomy 08/2007    for endometriosis. With concurrent right salpingo-oophorectomy  (2009), left oophorectomy (1995)  . Left tib fib   . Joint replacement     right hip  . Fracture surgery     Right hip  . Colonoscopy 09/22/2011    Procedure: COLONOSCOPY;  Surgeon: Arlyce Harman, MD;  Location: [facility];  Service: Endoscopy;  Laterality: N/A;  9:15    Family History  Problem Relation Age of Onset  . Diabetes type II    . Arthritis    . Breast cancer Sister 64    remission  . Cancer Sister     breast  . Colon cancer Neg Hx   . Liver disease Neg Hx   . Diabetes Other   . Cancer Other     colon cancer  . Lupus Cousin     History  Substance Use Topics  . Smoking status: Never Smoker   . Smokeless tobacco: Not on file  . Alcohol Use: No    OB History    Grav Para Term Preterm Abortions TAB SAB Ect Mult Living                   Review of Systems  Constitutional: Negative.   HENT: Positive for congestion.   Respiratory: Negative.   Cardiovascular: Negative.   Gastrointestinal: Negative for abdominal pain.  Genitourinary: Positive for dysuria, frequency and pelvic pain. Negative for decreased urine volume, vaginal bleeding, vaginal discharge, vaginal pain and menstrual problem.  Musculoskeletal: Positive for myalgias and back pain.  Skin: Negative.   Neurological: Negative.     Allergies  Pineapple; Morphine and related; and Tape  Home Medications   Current Outpatient Rx  Name  Route  Sig  Dispense  Refill  . ACETAMINOPHEN-CODEINE #3 300-30 MG PO TABS   Oral   Take 1 tablet by mouth every 8 (eight) hours as needed for pain.   60 tablet   0   . CEPHALEXIN 500 MG PO CAPS   Oral   Take 1 capsule (500 mg total) by mouth 4 (four) times daily.   28 capsule   0   . CETIRIZINE HCL 10 MG PO TABS   Oral   Take 1 tablet (10 mg total) by mouth daily.   30 tablet   2   . OMEPRAZOLE 20 MG PO CPDR   Oral   Take 1 capsule (20 mg total) by mouth daily.   30 capsule   1   . PROMETHAZINE HCL 25 MG PO TABS   Oral   Take 1 tablet (25 mg total) by mouth every 6 (six) hours as needed for nausea.   12 tablet   0   . SUMATRIPTAN SUCCINATE 50 MG PO TABS   Oral   Take 50 mg by mouth every 2 (two) hours as needed. For headache         . VITAMIN D (ERGOCALCIFEROL) 50000 UNITS PO CAPS   Oral   Take 50,000 Units by mouth every 7 (seven) days. Every wednesday           BP 174/116  Pulse 74  Temp 98.1 F (36.7 C) (Oral)  Resp 18  SpO2 100%  Physical Exam  Nursing note and vitals reviewed. Constitutional: She is oriented to person, place, and time. She appears well-developed and well-nourished. No distress.  HENT:  Head: Normocephalic and atraumatic.  Mouth/Throat: Oropharynx is clear and moist. No oropharyngeal exudate.  Eyes: EOM are normal. Pupils are equal, round, and reactive to light.   Neck: Normal range of motion. Neck supple.  Cardiovascular: Normal rate and normal heart sounds.   Pulmonary/Chest: Effort normal and breath sounds normal. No respiratory distress. She has no wheezes. She has no rales.  Abdominal: Soft. There is no tenderness. There is no rebound and no guarding.  Palpation of the pelvis reveals tenderness adjacent to the supra-pubis bilaterally.  Musculoskeletal: Normal range of motion.       Tenderness along the paralumbar spinal musculature. No bony tenderness. No flank tenderness.  Neurological: She is alert and oriented to person, place, and time. No cranial nerve deficit.  Skin: Skin is warm and dry. No erythema.  Psychiatric: She has a normal mood and affect.    ED Course  Procedures (including critical care time)  Labs Reviewed  POCT URINALYSIS DIP (DEVICE) - Abnormal; Notable for the following:    Hgb urine dipstick MODERATE (*)     Leukocytes, UA TRACE (*)  Biochemical Testing Only. Please order routine urinalysis from main lab if confirmatory testing is needed.   All other components within normal limits   No results found.   1. Cystitis   2. Lumbosacral strain       MDM  He must followup with your PCP as soon as possible for hypertension control. Results for orders placed during the hospital encounter of 08/27/12  POCT URINALYSIS DIP (DEVICE)      Component Value Range   Glucose, UA NEGATIVE  NEGATIVE mg/dL   Bilirubin Urine NEGATIVE  NEGATIVE   Ketones, ur NEGATIVE  NEGATIVE mg/dL   Specific Gravity, Urine 1.020  1.005 - 1.030   Hgb urine dipstick MODERATE (*) NEGATIVE   pH 6.0  5.0 - 8.0   Protein, ur NEGATIVE  NEGATIVE mg/dL   Urobilinogen, UA 0.2  0.0 - 1.0 mg/dL   Nitrite NEGATIVE  NEGATIVE   Leukocytes, UA TRACE (*) NEGATIVE   Drink plenty of fluids stay well hydrated Keflex 500 mg 4 times a day for 7 days Ibuprofen, Aleve or Tylenol for back pain. He also apply heat and perform stretches to help with muscle  discomfort        Hayden Rasmussen, NP 08/27/12 1846

## 2012-08-31 ENCOUNTER — Emergency Department (INDEPENDENT_AMBULATORY_CARE_PROVIDER_SITE_OTHER)
Admission: EM | Admit: 2012-08-31 | Discharge: 2012-08-31 | Disposition: A | Discharge status: Home / Self Care | Payer: Managed Care, Other (non HMO) | Source: Home / Self Care | Attending: Family Medicine | Admitting: Family Medicine

## 2012-08-31 ENCOUNTER — Other Ambulatory Visit: Payer: Managed Care, Other (non HMO)

## 2012-08-31 ENCOUNTER — Encounter (HOSPITAL_COMMUNITY): Payer: Self-pay

## 2012-08-31 DIAGNOSIS — J020 Streptococcal pharyngitis: Secondary | ICD-10-CM | POA: Diagnosis not present

## 2012-08-31 MED ORDER — IPRATROPIUM BROMIDE 0.06 % NA SOLN: 2.0000 | Freq: Four times a day (QID) | NASAL | Status: DC

## 2012-08-31 MED ORDER — AMOXICILLIN 500 MG PO CAPS: 500.0000 mg | ORAL_CAPSULE | Freq: Three times a day (TID) | ORAL | Status: DC

## 2012-08-31 NOTE — ED Notes (Signed)
Daughter positive for strep ; c/o cough, ST, fatigue, chills

## 2012-08-31 NOTE — ED Provider Notes (Signed)
History     CSN: [id number]  Arrival date & time 08/31/12  1417   First MD Initiated Contact with Patient 08/31/12 1430      Chief Complaint  Patient presents with  . Sore Throat    (Consider location/radiation/quality/duration/timing/severity/associated sxs/prior treatment) Patient is a 37 y.o. female presenting with pharyngitis. The history is provided by the patient.  Sore Throat This is a new problem. The current episode started more than 2 days ago. The problem has been gradually worsening. The symptoms are aggravated by swallowing.    Past Medical History  Diagnosis Date  . Osteoarthritis of hip   . History of migraine headaches     Typical symptoms - bright lights, unilateral (starting behind her left ear), throbbing .  Marland Kitchen Surgical menopause 08/2007    On HRT. Occuring since 01/09 following TAH and R-salpingo-oophorectomy   . Tibial fracture 10/2008    Left - Sustained 2/2 fall down stairs. Patient is S/P closed internal fixation with Smith Nephew tibial nail locked proximal and distal  . Endometriosis     S/P TAH and salpingo-oophorectomy, right  . Chronic cough   . Gastritis     Thought due to chronic NSAID use 2/2 pain from her congenital hip abnormality  . Epigastric pain     Chronic, thought 2/2 gastritis, EGD (07/14/2011) showing chronic duodenitis, H.pyloir neg // Repeat EGD (09/2011) - esophagealring, sessible polyp in stomach body, hiatal hernia - rec ad probiotic, await bx  . 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 With chronic OA of hip  . Vitamin D deficiency 11/2009    Vit D level 15 in 11/2009  . Closed fibular fracture 10/2008    Left - Sustained 2/2 fall down stairs (same fall as tibular fracture). S/P internal fixation.  . Hidradenitis suppurativa 05/2006  . Protrusio acetabuli     diagnosed at age 18  . Ovarian cancer 1995  . Headache     Migraines  . Duodenitis     EGD  (07/14/2011) showing chronic duodenitis, H.pyloir neg  . Esophageal ring     s/p dilatation during EGD (07/2011) - Dr. Darrick Penna  . Anxiety     pt denies having dx of anxiety    Past Surgical History  Procedure Date  . Cesarean section 08/2005    Primary low transverse  . Tubal ligation 08/2005    Right tubal ligation  . Laparoscopy 10/2000    Operative laparoscopy with lysis of right adnexal adhesions and uterointestinal adhesions  . Other surgical history 02/2009    Closed treatment internal fixation, left tibia with Katrinka Blazing and Nephew tibial nail locked proximal and distal  . Total abdominal hysterectomy w/ bilateral salpingoophorectomy 08/2007    for endometriosis. With concurrent right salpingo-oophorectomy  (2009), left oophorectomy (1995)  . Left tib fib   . Joint replacement     right hip  . Fracture surgery     Right hip  . Colonoscopy 09/22/2011    Procedure: COLONOSCOPY;  Surgeon: Arlyce Harman, MD;  Location: [facility];  Service: Endoscopy;  Laterality: N/A;  9:15    Family History  Problem Relation Age of Onset  . Diabetes type II    . Arthritis    . Breast cancer Sister 62    remission  . Cancer Sister     breast  . Colon cancer Neg Hx   . Liver disease Neg Hx   . Diabetes  Other   . Cancer Other     colon cancer  . Lupus Cousin     History  Substance Use Topics  . Smoking status: Never Smoker   . Smokeless tobacco: Not on file  . Alcohol Use: No    OB History    Grav Para Term Preterm Abortions TAB SAB Ect Mult Living                  Review of Systems  Constitutional: Negative.   HENT: Positive for congestion, sore throat, rhinorrhea and postnasal drip.   Respiratory: Negative.   Gastrointestinal: Negative.     Allergies  Pineapple; Morphine and related; and Tape  Home Medications   Current Outpatient Rx  Name  Route  Sig  Dispense  Refill  . ACETAMINOPHEN-CODEINE #3 300-30 MG PO TABS   Oral   Take 1 tablet by mouth every 8  (eight) hours as needed for pain.   60 tablet   0   . AMOXICILLIN 500 MG PO CAPS   Oral   Take 1 capsule (500 mg total) by mouth 3 (three) times daily.   30 capsule   0   . CEPHALEXIN 500 MG PO CAPS   Oral   Take 1 capsule (500 mg total) by mouth 4 (four) times daily.   28 capsule   0   . CETIRIZINE HCL 10 MG PO TABS   Oral   Take 1 tablet (10 mg total) by mouth daily.   30 tablet   2   . IPRATROPIUM BROMIDE 0.06 % NA SOLN   Nasal   Place 2 sprays into the nose 4 (four) times daily.   15 mL   1   . OMEPRAZOLE 20 MG PO CPDR   Oral   Take 1 capsule (20 mg total) by mouth daily.   30 capsule   1   . PROMETHAZINE HCL 25 MG PO TABS   Oral   Take 1 tablet (25 mg total) by mouth every 6 (six) hours as needed for nausea.   12 tablet   0   . SUMATRIPTAN SUCCINATE 50 MG PO TABS   Oral   Take 50 mg by mouth every 2 (two) hours as needed. For headache         . VITAMIN D (ERGOCALCIFEROL) 50000 UNITS PO CAPS   Oral   Take 50,000 Units by mouth every 7 (seven) days. Every wednesday           BP 165/98  Pulse 83  Temp 98.7 F (37.1 C) (Oral)  Resp 16  Ht 5\' 11"  (1.803 m)  Wt 165 lb (74.844 kg)  BMI 23.01 kg/m2  SpO2 100%  Physical Exam  Nursing note and vitals reviewed. Constitutional: She is oriented to person, place, and time. She appears well-developed and well-nourished.  HENT:  Head: Normocephalic.  Right Ear: External ear normal.  Left Ear: External ear normal.  Mouth/Throat: Oropharyngeal exudate present.  Eyes: Pupils are equal, round, and reactive to light.  Neck: Normal range of motion. Neck supple.  Pulmonary/Chest: Breath sounds normal.  Abdominal: Soft. Bowel sounds are normal.  Lymphadenopathy:    She has no cervical adenopathy.  Neurological: She is alert and oriented to person, place, and time.  Skin: Skin is warm and dry.    ED Course  Procedures (including critical care time)  Labs Reviewed - No data to display No results  found.   1. Strep sore throat  MDM          Linna Hoff, MD 08/31/12 [phone]

## 2012-09-03 ENCOUNTER — Ambulatory Visit
Admission: RE | Admit: 2012-09-03 | Discharge: 2012-09-03 | Disposition: A | Discharge status: Home / Self Care | Payer: Medicare Other | Source: Ambulatory Visit | Attending: Neurology | Admitting: Neurology

## 2012-09-03 ENCOUNTER — Other Ambulatory Visit: Payer: Medicare Other

## 2012-09-03 DIAGNOSIS — G43009 Migraine without aura, not intractable, without status migrainosus: Secondary | ICD-10-CM | POA: Diagnosis not present

## 2012-09-03 DIAGNOSIS — R413 Other amnesia: Secondary | ICD-10-CM

## 2012-09-14 DIAGNOSIS — M549 Dorsalgia, unspecified: Secondary | ICD-10-CM | POA: Diagnosis not present

## 2012-09-14 DIAGNOSIS — K219 Gastro-esophageal reflux disease without esophagitis: Secondary | ICD-10-CM | POA: Diagnosis not present

## 2012-09-14 DIAGNOSIS — M5137 Other intervertebral disc degeneration, lumbosacral region: Secondary | ICD-10-CM | POA: Diagnosis not present

## 2012-09-14 DIAGNOSIS — M899 Disorder of bone, unspecified: Secondary | ICD-10-CM | POA: Diagnosis not present

## 2012-09-16 ENCOUNTER — Encounter: Payer: Self-pay | Admitting: Internal Medicine

## 2012-09-16 ENCOUNTER — Ambulatory Visit (INDEPENDENT_AMBULATORY_CARE_PROVIDER_SITE_OTHER): Payer: Managed Care, Other (non HMO) | Admitting: Internal Medicine

## 2012-09-16 VITALS — BP 154/92 | HR 64 | Temp 97.4°F | Ht 71.0 in | Wt 179.0 lb

## 2012-09-16 DIAGNOSIS — G8929 Other chronic pain: Secondary | ICD-10-CM

## 2012-09-16 DIAGNOSIS — G43909 Migraine, unspecified, not intractable, without status migrainosus: Secondary | ICD-10-CM

## 2012-09-16 DIAGNOSIS — Z0000 Encounter for general adult medical examination without abnormal findings: Secondary | ICD-10-CM

## 2012-09-16 DIAGNOSIS — R300 Dysuria: Secondary | ICD-10-CM

## 2012-09-16 DIAGNOSIS — M25569 Pain in unspecified knee: Secondary | ICD-10-CM

## 2012-09-16 DIAGNOSIS — I10 Essential (primary) hypertension: Secondary | ICD-10-CM

## 2012-09-16 MED ORDER — AMLODIPINE BESYLATE 5 MG PO TABS: 5.0000 mg | ORAL_TABLET | Freq: Every day | ORAL | Status: DC

## 2012-09-16 NOTE — Patient Instructions (Addendum)
General Instructions:  Please follow-up at the clinic in 1-2 month, at which time we will reevaluate your blood pressure - OR, please follow-up in the clinic sooner if needed.  There have been changes in your medications:  START Amlodipine for your blood pressure.   If you have been started on new medication(s), and you develop symptoms concerning for allergic reaction, including, but not limited to, throat closing, tongue swelling, rash, please stop the medication immediately and call the clinic at [phone], and go to the ER.  If symptoms worsen, or new symptoms arise, please call the clinic or go to the ER.  PLEASE BRING ALL OF YOUR MEDICATIONS  IN A BAG TO YOUR NEXT APPOINTMENT   Treatment Goals:  Goals (1 Years of Data) as of 09/16/2012          As of Today 08/31/12 08/27/12 08/27/12 06/25/12     Blood Pressure    . Blood Pressure < 140/90  154/92 165/98 174/116 170/126 150/101      Progress Toward Treatment Goals:    Self Care Goals & Plans:  Self Care Goal 09/16/2012  Manage my medications take my medicines as prescribed; refill my medications on time  Monitor my health keep track of my blood pressure  Eat healthy foods eat foods that are low in salt; eat more vegetables; eat baked foods instead of fried foods  Be physically active take a walk every day       Care Management & Community Referrals:     LIFESTYLE TIPS TO HELP WITH YOUR BLOOD PRESSURE CONTROL  WEIGHT REDUCTION:  Strategies: A healthy weight loss program includes:  A calorie restricted diet based on individual calorie needs.   Increased physical activity (exercise).  An exercise program is just as important as the right low-calorie diet.    An unhealthy weight loss program includes:  Fasting.   Fad diets.   Supplements and drugs.  These choices do not succeed in long-term weight control.   Home Care Instructions: To help you make the needed dietary changes:   Exercise and perform  physical activity as directed by your caregiver.   Keep a daily record of everything you eat. There are many free websites to help you with this. It may be helpful to measure your foods so you can determine if you are eating the correct portion sizes.   Use low-calorie cookbooks or take special cooking classes.   Avoid alcohol. Drink more water and drinks with no calories.   Take vitamins and supplements only as recommended by your caregiver.   Weight loss support groups, Registered Dieticians, counselors, and stress reduction education can also be very helpful.   ________________________________________________________________________  DASH DIET:  The DASH diet stands for "Dietary Approaches to Stop Hypertension." It is a healthy eating plan that has been shown to reduce high blood pressure (hypertension) in as little as 14 days, while also possibly providing other significant health benefits. These other health benefits include reducing the risk of breast cancer after menopause and reducing the risk of type 2 diabetes, heart disease, colon cancer, and stroke. Health benefits also include weight loss and slowing kidney failure in patients with chronic kidney disease.   Diet guidelines: Limit salt (sodium). Your diet should contain less than 1500 mg of sodium daily.  Limit refined or processed carbohydrates. Your diet should include mostly whole grains. Desserts and added sugars should be used sparingly.  Include small amounts of heart-healthy fats. These types of fats include nuts,  oils, and tub margarine. Limit saturated and trans fats. These fats have been shown to be harmful in the body.   Choosing Foods: The following food groups are based on a 2000 calorie diet. See your Registered Dietitian for individual calorie needs.  Grains and Grain Products (6 to 8 servings daily)  Eat More Often: Whole-wheat bread, brown rice, whole-grain or wheat pasta, quinoa, popcorn without added fat or  salt (air popped).  Eat Less Often: White bread, white pasta, white rice, cornbread.  Vegetables (4 to 5 servings daily)  Eat More Often: Fresh, frozen, and canned vegetables. Vegetables may be raw, steamed, roasted, or grilled with a minimal amount of fat.  Eat Less Often/Avoid: Creamed or fried vegetables. Vegetables in a cheese sauce.  Fruit (4 to 5 servings daily)  Eat More Often: All fresh, canned (in natural juice), or frozen fruits. Dried fruits without added sugar. One hundred percent fruit juice ( cup [237 mL] daily).  Eat Less Often: Dried fruits with added sugar. Canned fruit in light or heavy syrup.  [employer], Fish, and Poultry (2 servings or less daily. One serving is 3 to 4 oz [85-114 g]).  Eat More Often: Ninety percent or leaner ground beef, tenderloin, sirloin. Round cuts of beef, chicken breast, [place] breast. All fish. Grill, bake, or broil your meat. Nothing should be fried.  Eat Less Often/Avoid: Fatty cuts of meat, [place], or chicken leg, thigh, or wing. Fried cuts of meat or fish.  Dairy (2 to 3 servings)  Eat More Often: Low-fat or fat-free milk, low-fat plain or light yogurt, reduced-fat or part-skim cheese.  Eat Less Often/Avoid: Milk (whole, 2%, skim, or chocolate). Whole milk yogurt. Full-fat cheeses.  Nuts, Seeds, and Legumes (4 to 5 servings per week)  Eat More Often: All without added salt.  Eat Less Often/Avoid: Salted nuts and seeds, canned beans with added salt.  Fats and Sweets (limited)  Eat More Often: Vegetable oils, tub margarines without trans fats, sugar-free gelatin. Mayonnaise and salad dressings.  Eat Less Often/Avoid: Coconut oils, palm oils, butter, stick margarine, cream, half and half, cookies, candy, pie.

## 2012-09-16 NOTE — Assessment & Plan Note (Signed)
Pertinent Data: BP Readings from Last 3 Encounters:  09/16/12 154/92  08/31/12 165/98  08/27/12 174/116    Basic Metabolic Panel:    Component Value Date/Time   NA 138 12/19/2011 1156   K 3.6 12/19/2011 1156   CL 104 12/19/2011 1156   CO2 27 12/19/2011 1156   BUN 10 12/19/2011 1156   CREATININE 0.87 12/19/2011 1156   CREATININE 0.86 09/06/2010 2031   GLUCOSE 86 12/19/2011 1156   CALCIUM 9.7 12/19/2011 1156    Assessment: Disease Control: moderately elevated  Progress toward goals: unchanged  Barriers to meeting goals: Has previously had intolerance to propranolol (resultant bradycardia with lightheadedness and dizziness). She stopped her amlodipine, was not sure if it caused nausea and vomiting although she had a GI bug and a similar time when it was started in her life 2013.    Patient is Not currently on  prescribed medications.   Plan:  Start amlodipine 5 mg daily. chlorothiazide it was considered, however, the patient indicates that she will spend extended period of time in the sun, therefore she is at risk of dehydration.  Educational resources provided:   I provided a handout regarding hypertension tips.  Self management tools provided:

## 2012-09-16 NOTE — Progress Notes (Addendum)
Patient: [name]   MRN: [id number]  DOB: 1975/10/14  PCP: Saralyn Pilar, DO   Subjective:    HPI: Ms. [name] is a 37 y.o. female with a PMHx of chronic gastritis, protrusio acetabula, vitamin D deficiency, who presented to clinic today for the following:  1) HTN - Patient does not check blood pressure regularly at home. Currently not on any medications. Reported prior adverse reaction to amlodipine. Her blood pressures have remained historically elevated. In July 2013, the patient was prescribed propranolol , which was subsequently discontinued in late July secondary to resultant bradycardia. She was at that time started on amlodipine, and she subsequently lost to followup, missing multiple appointments. She reports intolerance amlodipine. Admits to chronic migraines. Denies dizziness, lightheadedness, chest pain, shortness of breath.  2) Migraine - Patient describes a several year history of migraine headaches for which she is followed at [facility]. She was just seen in 08/2012 for this issues and reports a normal MRI, as well she was started on a prophylactic medication (she has not started and cannot recall the name).  intermittent,  sharp pain. Headaches are occuring 3-4x/ week, with attacks lasting approximately 1 hour (if she has taken medication).   Currently, the pain is rated a 10/10 in severity. Factors that precipitated current episode include: sounds, smells, bright lights. Aggravating factors: bright light and loud noise, emotional stress, alleviating factors: Rx Meds - triptan therapy and laying in dark room. denies recent trauma, MVA, falls. Family history: no known family members with significant headachesDenies associated loss of vision, stiff neck and vomiting.  3) Dysuria - she indicates persistent dysuria since her last UTI in January 2014, for which she was treated with Keflex x7 days in the ED. She completed the course of antibiotics as  prescribed.    Review of Systems: Per HPI.   Current Outpatient Medications: Medication Sig  . SUMAtriptan (IMITREX) 50 MG tablet Take 50 mg by mouth every 2 (two) hours as needed. For headache    Allergies  Allergen Reactions  . Pineapple Shortness Of Breath and Swelling    Has Epi pen  . Morphine And Related Itching  . Tape Swelling    Past Medical History  Diagnosis Date  . Osteoarthritis of hip   . History of migraine headaches     Typical symptoms - bright lights, unilateral (starting behind her left ear), throbbing .  Marland Kitchen Surgical menopause 08/2007    On HRT. Occuring since 01/09 following TAH and R-salpingo-oophorectomy   . Tibial fracture 10/2008    Left - Sustained 2/2 fall down stairs. Patient is S/P closed internal fixation with Smith Nephew tibial nail locked proximal and distal  . Endometriosis     S/P TAH and salpingo-oophorectomy, right  . Chronic cough   . Gastritis     Thought due to chronic NSAID use 2/2 pain from her congenital hip abnormality  . Epigastric pain     Chronic, thought 2/2 gastritis, EGD (07/14/2011) showing chronic duodenitis, H.pyloir neg // Repeat EGD (09/2011) - esophagealring, sessible polyp in stomach body, hiatal hernia - rec ad probiotic, await bx  . 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 With chronic OA of hip  . Vitamin D deficiency 11/2009    Vit D level 15 in 11/2009  . Closed fibular fracture 10/2008    Left - Sustained 2/2 fall down stairs (same fall as tibular fracture). S/P internal fixation.  Marland Kitchen  Hidradenitis suppurativa 05/2006  . Protrusio acetabuli     diagnosed at age 82  . Ovarian cancer 1995  . Headache     Migraines  . Duodenitis     EGD (07/14/2011) showing chronic duodenitis, H.pyloir neg  . Esophageal ring     s/p dilatation during EGD (07/2011) - Dr. Darrick Penna  . Anxiety     pt denies having dx of anxiety  . Sessile colonic polyp     noted  09/2011 -- > rec colonoscopy in 10 years, 2023  . Internal hemorrhoids     Past Surgical History  Procedure Date  . Cesarean section 08/2005    Primary low transverse  . Tubal ligation 08/2005    Right tubal ligation  . Laparoscopy 10/2000    Operative laparoscopy with lysis of right adnexal adhesions and uterointestinal adhesions  . Other surgical history 02/2009    Closed treatment internal fixation, left tibia with Katrinka Blazing and Nephew tibial nail locked proximal and distal  . Total abdominal hysterectomy w/ bilateral salpingoophorectomy 08/2007    for endometriosis. With concurrent right salpingo-oophorectomy  (2009), left oophorectomy (1995)  . Left tib fib   . Joint replacement     right hip  . Fracture surgery     Right hip  . Colonoscopy 09/22/2011    Procedure: COLONOSCOPY;  Surgeon: Arlyce Harman, MD;  Location: [facility];  Service: Endoscopy;  Laterality: N/A;  9:15     Objective:    Physical Exam: Filed Vitals:   09/16/12 1321  BP: 154/92  Pulse: 64  Temp: 97.4 F (36.3 C)      General: Vital signs reviewed and noted. Well-developed, well-nourished, in no acute distress; alert, appropriate and cooperative throughout examination.  Head: Normocephalic, atraumatic.  Lungs:  Normal respiratory effort. Clear to auscultation BL without crackles or wheezes.  Heart: RRR. S1 and S2 normal without gallop, rubs. No murmur.  Abdomen:  BS normoactive. Soft, Nondistended, non-tender.  No masses or organomegaly.  Extremities: No pretibial edema.  Neuro: Normal muscle strength BL upper and lower extremities. No loss of sensation or asymmetry. Otherwise, CN II-XII grossly intact.    Assessment/ Plan:   The patient's case and plan of care was discussed with attending physician, Dr. Blanch Media.

## 2012-09-16 NOTE — Assessment & Plan Note (Signed)
Assessment: Patient is chronically known to have migraines, and she indicates that they occur approximately 4-5 times per week. She is followed at [facility] for her migraines. She is recently started having a migraine again, with similar symptoms prior. She denies vision loss or other red flag symptoms. She has had an MRI in January 2014 that was negative per her report.  When she was last seen at [facility] in January 14, she was prescribed a prophylactic medication, which she has not yet started. She cannot recall the name.  She did not take her Imitrex today, because she could not reliably drive after the medication. Therefore, has just taken ibuprofen.  Given frequency of migraines, she will absolutely need prophylactic medication. Has previously been tried on propranolol, which was discontinued in July 2013 after resultant symptom bradycardia.  Plan:      Recommended to start the medication as prescribed per her neurologist.   I do not have records of the neurologist visit, therefore have requested them today.   Recommended to come to the ER for any red flag symptoms.

## 2012-09-16 NOTE — Assessment & Plan Note (Signed)
Health Maintenance  Topic Date Due  . Pap Smear  04/03/2013  . Tetanus/tdap  03/20/2020  . Influenza Vaccine  04/11/2012    Assessment:  Procedures due: None  Labs due: None  Immunizations due: Flu shot  Plan:  Patient was offered, counseled about and refused the flu shot today.

## 2012-09-16 NOTE — Assessment & Plan Note (Signed)
Assessment: Secondary to protrusio acetabuli. She requests a disability decal for her car today because of significant pain with prolonged walking.  Plan:      [employer] disability decal paperwork completed today

## 2012-09-17 LAB — URINALYSIS, ROUTINE W REFLEX MICROSCOPIC
Bilirubin Urine: NEGATIVE
Glucose, UA: NEGATIVE mg/dL
Hgb urine dipstick: NEGATIVE
Ketones, ur: NEGATIVE mg/dL
Nitrite: NEGATIVE
Specific Gravity, Urine: 1.017 (ref 1.005–1.030)
Urobilinogen, UA: 0.2 mg/dL (ref 0.0–1.0)
pH: 6 (ref 5.0–8.0)

## 2012-09-17 LAB — URINALYSIS, MICROSCOPIC ONLY
Casts: NONE SEEN
Crystals: NONE SEEN

## 2012-09-20 ENCOUNTER — Telehealth: Payer: Self-pay | Admitting: *Deleted

## 2012-09-20 DIAGNOSIS — R300 Dysuria: Secondary | ICD-10-CM | POA: Insufficient documentation

## 2012-09-20 MED ORDER — SULFAMETHOXAZOLE-TRIMETHOPRIM 800-160 MG PO TABS: 1.0000 | ORAL_TABLET | Freq: Two times a day (BID) | ORAL | Status: AC

## 2012-09-20 NOTE — Assessment & Plan Note (Signed)
Pertinent Data: Urinalysis    Component Value Date/Time   COLORURINE YELLOW 09/16/2012 1408   APPEARANCEUR CLEAR 09/16/2012 1408   LABSPEC 1.017 09/16/2012 1408   PHURINE 6.0 09/16/2012 1408   GLUCOSEU NEG 09/16/2012 1408   HGBUR NEG 09/16/2012 1408   HGBUR trace-lysed 08/15/2010 0813   BILIRUBINUR NEG 09/16/2012 1408   BILIRUBINUR negative 03/17/2011 1000   KETONESUR NEG 09/16/2012 1408   PROTEINUR NEG 09/16/2012 1408   UROBILINOGEN 0.2 09/16/2012 1408   UROBILINOGEN 0.2 03/17/2011 1000   NITRITE NEG 09/16/2012 1408   NITRITE negative 03/17/2011 1000   LEUKOCYTESUR MOD* 09/16/2012 1408    Assessment: Patient complains of dysuria, UA consistent with UTI (although WBC only 0-2)  Plan:      Bactrim DS BID x 3 days  Will ask triage to call in.

## 2012-09-20 NOTE — Telephone Encounter (Signed)
Tried to call pt as dr Saralyn Pilar ask, no answer, left message for rtc, one ph# has been disconnected

## 2012-09-20 NOTE — Addendum Note (Signed)
Addended by: Priscella Mann on: 09/20/2012 11:59 AM   Modules accepted: Orders

## 2012-09-22 NOTE — Telephone Encounter (Signed)
i spoke w/ pt 2/10, she expressed verbal understanding of instructions

## 2012-09-30 ENCOUNTER — Encounter: Payer: Self-pay | Admitting: Internal Medicine

## 2012-09-30 ENCOUNTER — Ambulatory Visit: Payer: Medicare Other | Admitting: Internal Medicine

## 2012-09-30 ENCOUNTER — Ambulatory Visit (INDEPENDENT_AMBULATORY_CARE_PROVIDER_SITE_OTHER): Payer: Managed Care, Other (non HMO) | Admitting: Internal Medicine

## 2012-09-30 VITALS — BP 129/88 | HR 68 | Temp 97.6°F | Resp 20 | Ht 71.0 in | Wt 180.6 lb

## 2012-09-30 DIAGNOSIS — R300 Dysuria: Secondary | ICD-10-CM

## 2012-09-30 DIAGNOSIS — E559 Vitamin D deficiency, unspecified: Secondary | ICD-10-CM

## 2012-09-30 DIAGNOSIS — R109 Unspecified abdominal pain: Secondary | ICD-10-CM

## 2012-09-30 DIAGNOSIS — R10A3 Flank pain, bilateral: Secondary | ICD-10-CM | POA: Insufficient documentation

## 2012-09-30 LAB — CBC WITH DIFFERENTIAL/PLATELET
Eosinophils Absolute: 0.2 10*3/uL (ref 0.0–0.7)
Eosinophils Relative: 2 % (ref 0–5)
HCT: 35.1 % — ABNORMAL LOW (ref 36.0–46.0)
Lymphocytes Relative: 30 % (ref 12–46)
Lymphs Abs: 2.4 10*3/uL (ref 0.7–4.0)
MCH: 26.4 pg (ref 26.0–34.0)
MCV: 80.7 fL (ref 78.0–100.0)
Monocytes Absolute: 0.7 10*3/uL (ref 0.1–1.0)
RBC: 4.35 MIL/uL (ref 3.87–5.11)
RDW: 15.7 % — ABNORMAL HIGH (ref 11.5–15.5)
WBC: 8.2 10*3/uL (ref 4.0–10.5)

## 2012-09-30 LAB — COMPREHENSIVE METABOLIC PANEL
ALT: 12 U/L (ref 0–35)
Albumin: 4.3 g/dL (ref 3.5–5.2)
BUN: 7 mg/dL (ref 6–23)
CO2: 27 mEq/L (ref 19–32)
Calcium: 9.8 mg/dL (ref 8.4–10.5)
Chloride: 105 mEq/L (ref 96–112)
Creat: 0.69 mg/dL (ref 0.50–1.10)
Potassium: 3.9 mEq/L (ref 3.5–5.3)

## 2012-09-30 NOTE — Progress Notes (Signed)
Subjective:   Patient ID: [name] female   DOB: [birth year] 37 y.o.   MRN: [id number]  HPI: [name] is a 37 y.o. female with past medical history significant as outlined below who presented to the clinic for regular followup for Urinary symptoms: Patient was evaluated in 1/17 at urgent care and was found to have cystitis and was given Keflex for 7 days, she was then evaluated on 2/6 and was given an additional 3 more doses of Keflex . Patient reports that  that her symptoms has never resolved including burning sensation, increased urinary frequency/urgency, back pain, strange odor.  Patient noted some "spasm" in the epigastric area. No vaginal discharge. Sexually active with husband ( married for 14 years) . Noted some  chills.  Patient reports that prior to this she had experienced sever back pain and the pain is still somewhat present. Now it is more achy pain which comes and goes. No significant aggravating or alleviating factors. Ibuprofen occasionally helps to relieve the pain. She denies any weakness or numbness in her legs. Had history of UTI in the past but not on a regular basis.     Past Medical History  Diagnosis Date  . Osteoarthritis of hip   . History of migraine headaches     Typical symptoms - bright lights, unilateral (starting behind her left ear), throbbing .  [facility] Surgical menopause 08/2007    On HRT. Occuring since 01/09 following TAH and R-salpingo-oophorectomy   . Tibial fracture 10/2008    Left - Sustained 2/2 fall down stairs. Patient is S/P closed internal fixation with Smith Nephew tibial nail locked proximal and distal  . Endometriosis     S/P TAH and salpingo-oophorectomy, right  . Chronic cough   . Gastritis     Thought due to chronic NSAID use 2/2 pain from her congenital hip abnormality  . Epigastric pain     Chronic, thought 2/2 gastritis, EGD (07/14/2011) showing chronic duodenitis, H.pyloir neg // Repeat EGD (09/2011) - esophagealring,  sessible polyp in stomach body, hiatal hernia - rec ad probiotic, await bx  . 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 With chronic OA of hip  . Vitamin D deficiency 11/2009    Vit D level 15 in 11/2009  . Closed fibular fracture 10/2008    Left - Sustained 2/2 fall down stairs (same fall as tibular fracture). S/P internal fixation.  . Hidradenitis suppurativa 05/2006  . Protrusio acetabuli     diagnosed at age 46  . Ovarian cancer 1995  . Headache     Migraines  . Duodenitis     EGD (07/14/2011) showing chronic duodenitis, H.pyloir neg  . Esophageal ring     s/p dilatation during EGD (07/2011) - Dr. Darrick Penna  . Anxiety     pt denies having dx of anxiety  . Sessile colonic polyp     noted 09/2011 -- > rec colonoscopy in 10 years, 2023  . Internal hemorrhoids    Current Outpatient Prescriptions  Medication Sig Dispense Refill  . amLODipine (NORVASC) 5 MG tablet Take 1 tablet (5 mg total) by mouth daily.  30 tablet  3  . SUMAtriptan (IMITREX) 50 MG tablet Take 50 mg by mouth every 2 (two) hours as needed. For headache       No current facility-administered medications for this visit.   Family History  Problem Relation Age of Onset  . Diabetes type II    .  Arthritis    . Breast cancer Sister 55    remission  . Cancer Sister     breast  . Colon cancer Neg Hx   . Liver disease Neg Hx   . Diabetes Other   . Cancer Other     colon cancer  . Lupus Cousin    History   Social History  . Marital Status: Married    Spouse Name: N/A    Number of Children: 2  . Years of Education: bs degree   Occupational History  . Stay-at-home mom    Social History Main Topics  . Smoking status: Never Smoker   . Smokeless tobacco: None  . Alcohol Use: No  . Drug Use: No  . Sexually Active: None   Other Topics Concern  . None   Social History Narrative   Insurance: Medicare.   Patient is married with two children,  [place] and [place].          Review of Systems: Constitutional: Denies fever, chills, diaphoresis, appetite change and fatigue.  Respiratory: Denies SOB, DOE, cough, chest tightness,  and wheezing.   Cardiovascular: Denies chest pain, palpitations and leg swelling.  Gastrointestinal: Denies nausea, vomiting, diarrhea, constipation, Genitourinary:Noted dysuria, urgency, frequency, hematuria, flank pain and difficulty urinating.  Neurological: Denies  weakness, numbness and headaches.   Objective:  Physical Exam: Filed Vitals:   09/30/12 1401  BP: 129/88  Pulse: 68  Temp: 97.6 F (36.4 C)  TempSrc: Oral  Resp: 20  Height: 5\' 11"  (1.803 m)  Weight: 180 lb 9.6 oz (81.92 kg)  SpO2: 100%   Constitutional: Vital signs reviewed.  Patient is a well-developed and well-nourished female in no acute distress and cooperative with exam. Alert and oriented x3.  Cardiovascular: RRR, S1 normal, S2 normal, no MRG, pulses symmetric and intact bilaterally Pulmonary/Chest: CTAB, no wheezes, rales, or rhonchi Abdominal: Soft. Mild tenderness in the supra-pubic area on palpation non-distended, bowel sounds are normal, no masses, GU: CVA tenderness Neurological: A&O x3, Strength is normal and symmetric bilaterally,sensory intact to light touch bilaterally.

## 2012-09-30 NOTE — Assessment & Plan Note (Addendum)
History of acute flank pain along with urinary symptoms and blood in U/A from 1/17 concerning for nephrolithiasis with possible obstruction. Other differential diagnosis include pyelonephritis. I will obtain a urine analysis along with urine culture, CBC, CMET  and a CT abdomen and pelvis without contrast today for further evaluation and management. Recommended patient if she is experiencing worsening symptoms to call the clinic or go to the emergency room for further evaluation and management

## 2012-09-30 NOTE — Assessment & Plan Note (Signed)
Currently not taking any. Last vitamin D level was 26.  Recommended vitamin D supplements

## 2012-10-01 LAB — URINALYSIS, ROUTINE W REFLEX MICROSCOPIC
Hgb urine dipstick: NEGATIVE
Ketones, ur: NEGATIVE mg/dL
Nitrite: NEGATIVE
Protein, ur: NEGATIVE mg/dL
Specific Gravity, Urine: 1.018 (ref 1.005–1.030)
Urobilinogen, UA: 0.2 mg/dL (ref 0.0–1.0)
pH: 7.5 (ref 5.0–8.0)

## 2012-10-02 LAB — URINE CULTURE: Colony Count: NO GROWTH

## 2012-10-08 ENCOUNTER — Ambulatory Visit (HOSPITAL_COMMUNITY)
Admission: RE | Admit: 2012-10-08 | Discharge: 2012-10-08 | Disposition: A | Discharge status: Home / Self Care | Payer: Managed Care, Other (non HMO) | Source: Ambulatory Visit | Attending: Internal Medicine | Admitting: Internal Medicine

## 2012-10-08 DIAGNOSIS — R109 Unspecified abdominal pain: Secondary | ICD-10-CM | POA: Insufficient documentation

## 2012-10-08 DIAGNOSIS — R300 Dysuria: Secondary | ICD-10-CM | POA: Diagnosis not present

## 2012-10-08 DIAGNOSIS — R10819 Abdominal tenderness, unspecified site: Secondary | ICD-10-CM | POA: Diagnosis not present

## 2012-10-12 ENCOUNTER — Ambulatory Visit (INDEPENDENT_AMBULATORY_CARE_PROVIDER_SITE_OTHER): Payer: Managed Care, Other (non HMO) | Admitting: Internal Medicine

## 2012-10-12 ENCOUNTER — Encounter: Payer: Self-pay | Admitting: Internal Medicine

## 2012-10-12 VITALS — BP 150/89 | HR 78 | Temp 97.6°F | Resp 20 | Ht 71.0 in | Wt 182.0 lb

## 2012-10-12 DIAGNOSIS — K5900 Constipation, unspecified: Secondary | ICD-10-CM

## 2012-10-12 DIAGNOSIS — M5380 Other specified dorsopathies, site unspecified: Secondary | ICD-10-CM

## 2012-10-12 DIAGNOSIS — M62830 Muscle spasm of back: Secondary | ICD-10-CM

## 2012-10-12 DIAGNOSIS — K5909 Other constipation: Secondary | ICD-10-CM | POA: Insufficient documentation

## 2012-10-12 MED ORDER — NAPROXEN SODIUM 220 MG PO TABS: 440.0000 mg | ORAL_TABLET | Freq: Two times a day (BID) | ORAL | Status: DC

## 2012-10-12 MED ORDER — POLYETHYLENE GLYCOL 3350 17 GM/SCOOP PO POWD: 17.0000 g | Freq: Every day | ORAL | Status: DC | PRN

## 2012-10-12 NOTE — Assessment & Plan Note (Signed)
With her limited range of motion in her back as well as tenderness to palpation over the paraspinous muscles I feel that her pain in her back is secondary to muscle spasm.  We will start with conservative therapy including Aleve, heating pads, ice packs, and stretching.  We discussed warning signs including pain down the leg, worsening of the back pain, or numbness and tingling in the legs.

## 2012-10-12 NOTE — Assessment & Plan Note (Signed)
Prior to her BM this morning she states that her last BM was almost 7 days prior.  CT scan done of the abdomen on 2/28 was normal but did show stool throughout the colon including the ascending colon.  We will start Miralax once daily and she will titrate that up to BID to achieve a 1 good soft BM daily.  We discussed warning signs including increased pain, blood in the stool, or documented fever >101 as reasons to present for reassessment.

## 2012-10-12 NOTE — Progress Notes (Signed)
Subjective:   Patient ID: [name] female   DOB: Jun 11, 1976 37 y.o.   MRN: [id number]  HPI: [name] is a 37 y.o. woman who presents to clinic today complaining of pain in her lower stomach that "seems to wrap around to my back" for the last 2-3 weeks.  She states that it is a sharp pain.  She states that up until yesterday she would get episodes of the pain and then last night she had some nausea and vomited and the pain became more persistent.  She states that she has some fullness but that the pain does not worsen or get better with meals and it does not change with bowel movements.  Her last BM was this morning and was soft.  Prior to that she last had a BM over 7 days prior.  She denies fever, chills, vaginal discharge, diarrhea, tenesmus, dysuria, urinary urgency, increased urinary frequency, blood in her stool, blood in her vomitus, numbness, tingling, or weakness in her legs, urinary retention, or incontinence.  She hasn't taken anything for the pain.    Past Medical History  Diagnosis Date  . Osteoarthritis of hip   . History of migraine headaches     Typical symptoms - bright lights, unilateral (starting behind her left ear), throbbing .  Marland Kitchen Surgical menopause 08/2007    On HRT. Occuring since 01/09 following TAH and R-salpingo-oophorectomy   . Tibial fracture 10/2008    Left - Sustained 2/2 fall down stairs. Patient is S/P closed internal fixation with Smith Nephew tibial nail locked proximal and distal  . Endometriosis     S/P TAH and salpingo-oophorectomy, right  . Chronic cough   . Gastritis     Thought due to chronic NSAID use 2/2 pain from her congenital hip abnormality  . Epigastric pain     Chronic, thought 2/2 gastritis, EGD (07/14/2011) showing chronic duodenitis, H.pyloir neg // Repeat EGD (09/2011) - esophagealring, sessible polyp in stomach body, hiatal hernia - rec ad probiotic, await bx  . Hip deformity, congenital     Protrusio acetabuli with  articular sclerosis and flattening of femoral heads. With chronic OA of hip  . Vitamin D deficiency 11/2009    Vit D level 15 in 11/2009  . Closed fibular fracture 10/2008    Left - Sustained 2/2 fall down stairs (same fall as tibular fracture). S/P internal fixation.  . Hidradenitis suppurativa 05/2006  . Protrusio acetabuli     diagnosed at age 37  . Ovarian cancer 1995  . Headache     Migraines  . Duodenitis     EGD (07/14/2011) showing chronic duodenitis, H.pyloir neg  . Esophageal ring     s/p dilatation during EGD (07/2011) - Dr. Darrick Penna  . Anxiety     pt denies having dx of anxiety  . Sessile colonic polyp     noted 09/2011 -- > rec colonoscopy in 10 years, 2023  . Internal hemorrhoids    Current Outpatient Prescriptions  Medication Sig Dispense Refill  . naproxen sodium (ALEVE) 220 MG tablet Take 2 tablets (440 mg total) by mouth 2 (two) times daily with a meal.      . polyethylene glycol powder (GLYCOLAX/MIRALAX) powder Take 17 g by mouth daily as needed.  255 g  1  . SUMAtriptan (IMITREX) 50 MG tablet Take 50 mg by mouth every 2 (two) hours as needed. For headache       No current facility-administered medications for this visit.  Family History  Problem Relation Age of Onset  . Diabetes type II    . Arthritis    . Breast cancer Sister 71    remission  . Cancer Sister     breast  . Colon cancer Neg Hx   . Liver disease Neg Hx   . Diabetes Other   . Cancer Other     colon cancer  . Lupus Cousin    History   Social History  . Marital Status: Married    Spouse Name: N/A    Number of Children: 2  . Years of Education: bs degree   Occupational History  . Stay-at-home mom    Social History Main Topics  . Smoking status: Never Smoker   . Smokeless tobacco: None  . Alcohol Use: No  . Drug Use: No  . Sexually Active: None   Other Topics Concern  . None   Social History Narrative   Insurance: Medicare.   Patient is married with two children, [place]  and [place].          Review of Systems: Negative except as noted in the HPI.   Objective:  Physical Exam: Filed Vitals:   10/12/12 1337  BP: 150/89  Pulse: 78  Temp: 97.6 F (36.4 C)  TempSrc: Oral  Resp: 20  Height: 5\' 11"  (1.803 m)  Weight: 182 lb (82.555 kg)  SpO2: 100%   Constitutional: Vital signs reviewed.  Patient is a well-developed and well-nourished woman in mild distress from pain. Cooperative with exam. Alert and oriented x3.  Head: Normocephalic and atraumatic Ear: TM normal bilaterally Mouth: no erythema or exudates, MMM Eyes: PERRL, EOMI, conjunctivae normal, No scleral icterus.  Neck: Supple, Trachea midline normal ROM, No JVD, mass, thyromegaly, or carotid bruit present.  Cardiovascular: RRR, S1 normal, S2 normal, no MRG, pulses symmetric and intact bilaterally Pulmonary/Chest: CTAB, no wheezes, rales, or rhonchi Abdominal: Soft. Diffuse tenderness to palpation with voluntary guarding.  No tenderness to percussion.  non-distended, bowel sounds are normal, no masses, organomegaly present.  GU: no CVA tenderness Musculoskeletal: Neck ROM is normal in all planes.  Back is limited in forward flexion and extension secondary to low back pain.  There is also pain with lateral bending as well as rotation.  No tenderness to palpation over the bony spine but mild tenderness to palpation over the lumbar paraspinous muscles.   Hematology: no cervical, inginal, or axillary adenopathy.  Neurological: A&O x3, Strength is normal and symmetric bilaterally, cranial nerve II-XII are grossly intact, no focal motor deficit, sensory intact to light touch bilaterally.  Skin: Warm, dry and intact. No rash, cyanosis, or clubbing.  Psychiatric: Normal mood and affect. speech and behavior is normal. Judgment and thought content normal. Cognition and memory are normal.   Assessment & Plan:

## 2012-10-12 NOTE — Patient Instructions (Addendum)
1.  You have constipation as the cause of your stomach pain.  - Stop at the pharmacy and pick up Miralax.  The store brand works just as good as the name brand.    - Take Miralax as directed on the bottle once daily  - The goal is to have one soft bowel movement daily  - You can increase it to twice daily if needed  - Be sure you drink plenty of water  2.  You have a back spasm in the muscles in the lower back.    - Take Aleve 220 mg, 2 tablets twice daily for up to 7 days.  - Use a Heating pad or ice pack on the back, 20 minutes at a time several times daily to help with your back  - Use the exercises below to help strengthen your back and increase your flexibility.  3.  Follow up with your PCP in 1 month   Back Exercises Back exercises help treat and prevent back injuries. The goal of back exercises is to increase the strength of your abdominal and back muscles and the flexibility of your back. These exercises should be started when you no longer have back pain. Back exercises include:  Pelvic Tilt. Lie on your back with your knees bent. Tilt your pelvis until the lower part of your back is against the floor. Hold this position 5 to 10 sec and repeat 5 to 10 times.  Knee to Chest. Pull first 1 knee up against your chest and hold for 20 to 30 seconds, repeat this with the other knee, and then both knees. This may be done with the other leg straight or bent, whichever feels better.  Sit-Ups or Curl-Ups. Bend your knees 90 degrees. Start with tilting your pelvis, and do a partial, slow sit-up, lifting your trunk only 30 to 45 degrees off the floor. Take at least 2 to 3 seconds for each sit-up. Do not do sit-ups with your knees out straight. If partial sit-ups are difficult, simply do the above but with only tightening your abdominal muscles and holding it as directed.  Hip-Lift. Lie on your back with your knees flexed 90 degrees. Push down with your feet and shoulders as you raise your hips a  couple inches off the floor; hold for 10 seconds, repeat 5 to 10 times.  Back arches. Lie on your stomach, propping yourself up on bent elbows. Slowly press on your hands, causing an arch in your low back. Repeat 3 to 5 times. Any initial stiffness and discomfort should lessen with repetition over time.  Shoulder-Lifts. Lie face down with arms beside your body. Keep hips and torso pressed to floor as you slowly lift your head and shoulders off the floor. Do not overdo your exercises, especially in the beginning. Exercises may cause you some mild back discomfort which lasts for a few minutes; however, if the pain is more severe, or lasts for more than 15 minutes, do not continue exercises until you see your caregiver. Improvement with exercise therapy for back problems is slow.  See your caregivers for assistance with developing a proper back exercise program. Document Released: 09/04/2004 Document Revised: 10/20/2011 Document Reviewed: 05/29/2011 [facility] Patient Information 2013 [place], [place].

## 2012-10-14 ENCOUNTER — Ambulatory Visit: Payer: Medicare Other | Admitting: Internal Medicine

## 2012-11-15 ENCOUNTER — Encounter: Payer: Self-pay | Admitting: Internal Medicine

## 2012-11-15 ENCOUNTER — Ambulatory Visit (INDEPENDENT_AMBULATORY_CARE_PROVIDER_SITE_OTHER): Payer: Managed Care, Other (non HMO) | Admitting: Internal Medicine

## 2012-11-15 VITALS — BP 139/90 | HR 59 | Temp 97.7°F | Ht 71.0 in | Wt 178.9 lb

## 2012-11-15 DIAGNOSIS — K5900 Constipation, unspecified: Secondary | ICD-10-CM

## 2012-11-15 DIAGNOSIS — Z0000 Encounter for general adult medical examination without abnormal findings: Secondary | ICD-10-CM

## 2012-11-15 DIAGNOSIS — G43909 Migraine, unspecified, not intractable, without status migrainosus: Secondary | ICD-10-CM

## 2012-11-15 DIAGNOSIS — I10 Essential (primary) hypertension: Secondary | ICD-10-CM

## 2012-11-15 DIAGNOSIS — E559 Vitamin D deficiency, unspecified: Secondary | ICD-10-CM

## 2012-11-15 NOTE — Assessment & Plan Note (Signed)
Health Maintenance  Topic Date Due  . Pap Smear  04/03/2013  . Influenza Vaccine  04/11/2013  . Tetanus/tdap  03/20/2020    Assessment:  Procedures due: None  Labs due: None  Immunizations due: Flu shot - refuses  Plan:  Refuses flu shot  Otherwise UTD

## 2012-11-15 NOTE — Assessment & Plan Note (Addendum)
Pertinent Data: BP Readings from Last 3 Encounters:  11/15/12 139/90  10/12/12 150/89  09/30/12 129/88    Basic Metabolic Panel:    Component Value Date/Time   NA 141 09/30/2012 1456   K 3.9 09/30/2012 1456   CL 105 09/30/2012 1456   CO2 27 09/30/2012 1456   BUN 7 09/30/2012 1456   CREATININE 0.69 09/30/2012 1456   CREATININE 0.86 09/06/2010 2031   GLUCOSE 97 09/30/2012 1456   CALCIUM 9.8 09/30/2012 1456    Assessment: Disease Control: mildly elevated  Progress toward goals:  improved  Barriers to meeting goals: nonadherence to medications    The patient was started on amlodipine 5 mg daily in February 2014 in response to historically and persistently elevated blood pressures. She stopped taking the medication after a few weeks secondary to symptoms of lightheadedness. She did attempt to take one half of the pill daily.  The patient does not want to take daily medications, and would prefer to be off of medications at this time here  She was counseled on the long-term health ramifications of poorly controlled hypertension and indicates verbal understanding of the information provided. She speicifically was notified of her increased risk for renal failure, heart disease, strokes, vision loss, peripheral vascular disease, amongst others. She verbally indicates understanding of the information provided - however, continues to refuse therapy.  She would prefer to attempt diet and lifestyle changes prior to initiation of medication.     Plan:  Stop amlodipine. the patient is already not taking this medication.  Attempt lifestyle modification include diet modification and weight loss for blood pressure control per patient preference at this time.  Handout regarding DASH diet and weight loss tips was provided.  Educational resources provided: handout  Self management tools provided:

## 2012-11-15 NOTE — Assessment & Plan Note (Signed)
Assessment: Continues to have frequent migraine headaches. She is being evaluated by [facility] neurologic for this issue. She has been recommended prophylactic migraine medication, however she is hesitant to take daily medications. She reports a recent negative MRI at [facility] neurologic.  Plan:      Continue when necessary ibuprofen and when necessary Imitrex.   Again counseled on red flags as well as consideration for starting prophylactic medication if migraine headaches are persistent.  Patient continues to be hesitant to start daily medications, and would prefer when necessary as she is currently on.

## 2012-11-15 NOTE — Patient Instructions (Addendum)
General Instructions:  Please follow-up at the clinic in 3-6  months, at which time we will reevaluate your blood pressure,  migraines - OR, please follow-up in the clinic sooner if needed.  There have not been changes in your medications.    If you have been started on new medication(s), and you develop symptoms concerning for allergic reaction, including, but not limited to, throat closing, tongue swelling, rash, please stop the medication immediately and call the clinic at [phone], and go to the ER.  If symptoms worsen, or new symptoms arise, please call the clinic or go to the ER.  PLEASE BRING ALL OF YOUR MEDICATIONS  IN A BAG TO YOUR NEXT APPOINTMENT   Treatment Goals:  Goals (1 Years of Data) as of 11/15/12         As of Today 10/12/12 09/30/12 09/16/12 08/31/12     Blood Pressure    . Blood Pressure < 140/90  130/90 150/89 129/88 154/92 165/98      Progress Toward Treatment Goals:  Treatment Goal 09/16/2012  Blood pressure unchanged    Self Care Goals & Plans:  Self Care Goal 10/12/2012  Manage my medications take my medicines as prescribed  Monitor my health keep track of my blood pressure  Eat healthy foods drink diet soda or water instead of juice or soda  Be physically active find an activity I enjoy    LIFESTYLE TIPS TO HELP WITH YOUR BLOOD PRESSURE CONTROL  WEIGHT REDUCTION:  Strategies: A healthy weight loss program includes:  A calorie restricted diet based on individual calorie needs.   Increased physical activity (exercise).  An exercise program is just as important as the right low-calorie diet.    An unhealthy weight loss program includes:  Fasting.   Fad diets.   Supplements and drugs.  These choices do not succeed in long-term weight control.   Home Care Instructions: To help you make the needed dietary changes:   Exercise and perform physical activity as directed by your caregiver.   Keep a daily record of everything you eat. There are  many free websites to help you with this. It may be helpful to measure your foods so you can determine if you are eating the correct portion sizes.   Use low-calorie cookbooks or take special cooking classes.   Avoid alcohol. Drink more water and drinks with no calories.   Take vitamins and supplements only as recommended by your caregiver.   Weight loss support groups, Registered Dieticians, counselors, and stress reduction education can also be very helpful.   ________________________________________________________________________  DASH DIET:  The DASH diet stands for "Dietary Approaches to Stop Hypertension." It is a healthy eating plan that has been shown to reduce high blood pressure (hypertension) in as little as 14 days, while also possibly providing other significant health benefits. These other health benefits include reducing the risk of breast cancer after menopause and reducing the risk of type 2 diabetes, heart disease, colon cancer, and stroke. Health benefits also include weight loss and slowing kidney failure in patients with chronic kidney disease.   Diet guidelines: Limit salt (sodium). Your diet should contain less than 1500 mg of sodium daily.  Limit refined or processed carbohydrates. Your diet should include mostly whole grains. Desserts and added sugars should be used sparingly.  Include small amounts of heart-healthy fats. These types of fats include nuts, oils, and tub margarine. Limit saturated and trans fats. These fats have been shown to be harmful in the  body.   Choosing Foods: The following food groups are based on a 2000 calorie diet. See your Registered Dietitian for individual calorie needs.  Grains and Grain Products (6 to 8 servings daily)  Eat More Often: Whole-wheat bread, brown rice, whole-grain or wheat pasta, quinoa, popcorn without added fat or salt (air popped).  Eat Less Often: White bread, white pasta, white rice, cornbread.  Vegetables (4 to 5  servings daily)  Eat More Often: Fresh, frozen, and canned vegetables. Vegetables may be raw, steamed, roasted, or grilled with a minimal amount of fat.  Eat Less Often/Avoid: Creamed or fried vegetables. Vegetables in a cheese sauce.  Fruit (4 to 5 servings daily)  Eat More Often: All fresh, canned (in natural juice), or frozen fruits. Dried fruits without added sugar. One hundred percent fruit juice ( cup [237 mL] daily).  Eat Less Often: Dried fruits with added sugar. Canned fruit in light or heavy syrup.  [employer], Fish, and Poultry (2 servings or less daily. One serving is 3 to 4 oz [85-114 g]).  Eat More Often: Ninety percent or leaner ground beef, tenderloin, sirloin. Round cuts of beef, chicken breast, [place] breast. All fish. Grill, bake, or broil your meat. Nothing should be fried.  Eat Less Often/Avoid: Fatty cuts of meat, [place], or chicken leg, thigh, or wing. Fried cuts of meat or fish.  Dairy (2 to 3 servings)  Eat More Often: Low-fat or fat-free milk, low-fat plain or light yogurt, reduced-fat or part-skim cheese.  Eat Less Often/Avoid: Milk (whole, 2%, skim, or chocolate). Whole milk yogurt. Full-fat cheeses.  Nuts, Seeds, and Legumes (4 to 5 servings per week)  Eat More Often: All without added salt.  Eat Less Often/Avoid: Salted nuts and seeds, canned beans with added salt.  Fats and Sweets (limited)  Eat More Often: Vegetable oils, tub margarines without trans fats, sugar-free gelatin. Mayonnaise and salad dressings.  Eat Less Often/Avoid: Coconut oils, palm oils, butter, stick margarine, cream, half and half, cookies, candy, pie.

## 2012-11-15 NOTE — Progress Notes (Signed)
I discussed patient with resident Dr. Saralyn Pilar on the date of visit, and reviewed the history, findings, diagnosis, and treatment plan as outlined in the resident's note. I agree with the assessment and plans as outlined in her note.

## 2012-11-15 NOTE — Assessment & Plan Note (Addendum)
Pertinent Data:  Ref. Range 06/05/2009  11/13/2009  12/19/2011   Vit D, 25-Hydroxy Latest Range: 30-89 ng/mL 16 (L) 15 (L) 26 (L)    Assessment: She is not currently taking any over-the-counter supplementation.  Plan:      Will check 25-OH vitamin D level today, treat as indicated.   ADDENDUM TO PLAN AFTER LABS RESULTED: 11/16/2012, 8:31 AM  Pertinent Labs:  Ref. Range 11/15/2012  Vit D, 25-Hydroxy Latest Range: 30-89 ng/mL 16 (L)    Assessment: Patient has vitamin D deficiency and will require high dose vitamin D supplementation. Followed by regular supplementation.  Plan:  Will start high dose vitamin D 50,000 units x 8 weeks, then will transition to Vitamin D 800 IU daily.  Will ask triage to call and inform pt.

## 2012-11-15 NOTE — Progress Notes (Addendum)
Patient: [name]   MRN: [id number]  DOB: [birth year]  PCP: Saralyn Pilar, DO   Subjective:    HPI: [name] is a 37 y.o. female with a PMHx as outlined below, who presented to clinic today for the following:  1) Stomach pain followup - was seen in 10/2012 for lower abd pain x 2-3 weeks prior to visit. This was thought likely secondary to significant constipation as the patient had not had a bowel movement in 7 days prior to the visit. She was started on MiraLax, which was ineffective. However, the patient did purchase over-the-counter docusate, which she did find to be effective. She is now having bowel movements at least every other day. No N/V/F/C.  2) Physical exam - patient feeling well, without symptoms of chest pain, shortness of breath, difficulty breathing. No nausea, vomiting, diarrhea. She does indicate constipation, which is improved from prior see above. No recurrent lightheadedness, dizziness. She has chronic headaches which are controlled with her Imitrex. She has not having any recent unintentional weight loss or night sweats. No dysuria, hematuria, flank pain, fever, chills.   3) HTN - Patient does not check blood pressure regularly at home. Is supposed to be taking Amlodipine 5mg  which was prescribed in 09/2012. However, she stopped taking the medication after < 1 month because of resultant dizziness. She does not prefer to be on daily medications. Wants to attempt weight loss and dietary modifications admits to chronic headaches. Denies dizziness, lightheadedness, chest pain, shortness of breath.   Review of Systems: Per HPI.   Current Outpatient Medications: Medication Sig  . ibuprofen (ADVIL,MOTRIN) 200 MG tablet Take 600 mg by mouth every 8 (eight) hours as needed for pain.  . SUMAtriptan (IMITREX) 50 MG tablet Take 50 mg by mouth every 2 (two) hours as needed. For headache    Allergies  Allergen Reactions  . Pineapple Shortness Of Breath and  Swelling    Has Epi pen  . Morphine And Related Itching  . Tape Swelling    Past Medical History  Diagnosis Date  . Osteoarthritis of hip   . History of migraine headaches     Typical symptoms - bright lights, unilateral (starting behind her left ear), throbbing .  Marland Kitchen Surgical menopause 08/2007    On HRT. Occuring since 01/09 following TAH and R-salpingo-oophorectomy   . Tibial fracture 10/2008    Left - Sustained 2/2 fall down stairs. Patient is S/P closed internal fixation with Smith Nephew tibial nail locked proximal and distal  . Endometriosis     S/P TAH and salpingo-oophorectomy, right  . Chronic cough   . Gastritis     Thought due to chronic NSAID use 2/2 pain from her congenital hip abnormality  . Epigastric pain     Chronic, thought 2/2 gastritis, EGD (07/14/2011) showing chronic duodenitis, H.pyloir neg // Repeat EGD (09/2011) - esophagealring, sessible polyp in stomach body, hiatal hernia - rec ad probiotic, await bx  . 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 With chronic OA of hip  . Vitamin D deficiency 11/2009    Vit D level 15 in 11/2009  . Closed fibular fracture 10/2008    Left - Sustained 2/2 fall down stairs (same fall as tibular fracture). S/P internal fixation.  . Hidradenitis suppurativa 05/2006  . Protrusio acetabuli     diagnosed at age 44  . Ovarian cancer 1995  . Headache     Migraines  .  Duodenitis     EGD (07/14/2011) showing chronic duodenitis, H.pyloir neg  . Esophageal ring     s/p dilatation during EGD (07/2011) - Dr. Darrick Penna  . Anxiety     pt denies having dx of anxiety  . Sessile colonic polyp     noted 09/2011 -- > rec colonoscopy in 10 years, 2023  . Internal hemorrhoids     Past Surgical History  Procedure Laterality Date  . Cesarean section  08/2005    Primary low transverse  . Tubal ligation  08/2005    Right tubal ligation  . Laparoscopy  10/2000    Operative  laparoscopy with lysis of right adnexal adhesions and uterointestinal adhesions  . Other surgical history  02/2009    Closed treatment internal fixation, left tibia with Katrinka Blazing and Nephew tibial nail locked proximal and distal  . Total abdominal hysterectomy w/ bilateral salpingoophorectomy  08/2007    for endometriosis. With concurrent right salpingo-oophorectomy  (2009), left oophorectomy (1995)  . Left tib fib    . Joint replacement      right hip  . Fracture surgery      Right hip  . Colonoscopy  09/22/2011    Procedure: COLONOSCOPY;  Surgeon: Arlyce Harman, MD;  Location: [facility];  Service: Endoscopy;  Laterality: N/A;  9:15     Objective:    Physical Exam: Filed Vitals:   11/15/12 0931  BP: 139/90  Pulse: 59  Temp: 97.7 F (36.5 C)     General: Vital signs reviewed and noted. Well-developed, well-nourished, in no acute distress; alert, appropriate and cooperative throughout examination.  Head: Normocephalic, atraumatic.  Eyes: conjunctivae/corneas clear. PERRL, EOM's intact.   Ears: TM nonerythematous, not bulging, good light reflex bilaterally.  Nose: Mucous membranes moist, not inflammed, nonerythematous.  Throat: Oropharynx nonerythematous, no exudate appreciated.   Neck: No deformities, masses, or tenderness noted.  Lungs:  Normal respiratory effort. Clear to auscultation BL without crackles or wheezes.  Heart: RRR. S1 and S2 normal without gallop, rubs. No murmur.  Abdomen:  BS normoactive. Soft, Nondistended, non-tender.  No masses or organomegaly.  Extremities: No pretibial edema.  Neurologic: A&O X3, CN II - XII are grossly intact. Motor strength is 5/5 in the all 4 extremities, Sensations intact to light touch, Cerebellar signs negative.    Assessment/ Plan:   The patient's case and plan of care was discussed with attending physician, Dr. Margarito Liner.

## 2012-11-15 NOTE — Assessment & Plan Note (Signed)
Assessment: During last visit, the patient complained of significant constipation with bowel movement one week prior to visit. She was started on MiraLax, which she found to be ineffective. She has started docusate at home with resultant bowel movements every one to 2 days. Stomach pain is improved.  Plan:      Reviewed the importance of high-fiber diet particularly given her known history of internal hemorrhoids.  Recommended to use stool softeners when necessary to allow for bowel movements every few days. Should not let bowel movement frequency extend >2-3 days.

## 2012-11-16 ENCOUNTER — Telehealth: Payer: Self-pay | Admitting: *Deleted

## 2012-11-16 LAB — VITAMIN D 25 HYDROXY (VIT D DEFICIENCY, FRACTURES): Vit D, 25-Hydroxy: 16 ng/mL — ABNORMAL LOW (ref 30–89)

## 2012-11-16 MED ORDER — ERGOCALCIFEROL 1.25 MG (50000 UT) PO CAPS: 50000.0000 [IU] | ORAL_CAPSULE | ORAL | Status: DC

## 2012-11-16 MED ORDER — CALCIUM-VITAMIN D 500-400 MG-UNIT PO TABS: 1000.0000 mg | ORAL_TABLET | Freq: Every day | ORAL | Status: DC

## 2012-11-16 NOTE — Telephone Encounter (Signed)
Call to pt no answer at her home number/Cell number.  Call to pt's mother message left with Mrs. Allen to have pt to call the Clinics and to ask for Leigh.  Angelina Ok, RN 11/16/2012 2:55 PM.

## 2012-11-16 NOTE — Progress Notes (Signed)
Quick Note:  Will start high dose vitamin D supplement followed by daily supplement. Will ask triage to call pt to inform. RX placed. ______

## 2012-11-16 NOTE — Addendum Note (Signed)
Addended by: Priscella Mann on: 11/16/2012 08:39 AM   Modules accepted: Orders

## 2012-11-16 NOTE — Addendum Note (Signed)
Addended by: Priscella Mann on: 11/16/2012 12:45 PM   Modules accepted: Orders

## 2012-12-23 ENCOUNTER — Ambulatory Visit: Payer: 59 | Admitting: Internal Medicine

## 2012-12-23 ENCOUNTER — Telehealth: Payer: Self-pay | Admitting: *Deleted

## 2012-12-23 NOTE — Telephone Encounter (Signed)
Returned pt's call - she had left a message for an appt. She had stepped on a piece of wood at the park about 2 weeks ago and now it is  Hurting "like a toothache". I called [phone] - VM has not been set up. Left message on [phone] and left a message w/her grandmother at [phone].  Only appt available today is at 1045AM. Will wait until she returns my call.

## 2012-12-23 NOTE — Telephone Encounter (Signed)
Talked to pt - appt has been scheduled on Wed 5/21 @ 1415PM.

## 2012-12-24 DIAGNOSIS — M2040 Other hammer toe(s) (acquired), unspecified foot: Secondary | ICD-10-CM | POA: Diagnosis not present

## 2012-12-24 DIAGNOSIS — M79609 Pain in unspecified limb: Secondary | ICD-10-CM | POA: Diagnosis not present

## 2012-12-24 DIAGNOSIS — M2010 Hallux valgus (acquired), unspecified foot: Secondary | ICD-10-CM | POA: Diagnosis not present

## 2012-12-24 DIAGNOSIS — D2370 Other benign neoplasm of skin of unspecified lower limb, including hip: Secondary | ICD-10-CM | POA: Diagnosis not present

## 2012-12-29 ENCOUNTER — Encounter: Payer: Self-pay | Admitting: Internal Medicine

## 2012-12-29 ENCOUNTER — Ambulatory Visit (INDEPENDENT_AMBULATORY_CARE_PROVIDER_SITE_OTHER): Payer: Managed Care, Other (non HMO) | Admitting: Internal Medicine

## 2012-12-29 VITALS — BP 150/93 | HR 68 | Temp 98.0°F | Ht 71.0 in | Wt 182.5 lb

## 2012-12-29 DIAGNOSIS — E8941 Symptomatic postprocedural ovarian failure: Secondary | ICD-10-CM

## 2012-12-29 DIAGNOSIS — E559 Vitamin D deficiency, unspecified: Secondary | ICD-10-CM

## 2012-12-29 DIAGNOSIS — R52 Pain, unspecified: Secondary | ICD-10-CM

## 2012-12-29 MED ORDER — ESTROGENS CONJUGATED 0.625 MG PO TABS: 0.6250 mg | ORAL_TABLET | Freq: Every day | ORAL | Status: DC

## 2012-12-29 MED ORDER — VITAMIN D 1000 UNITS PO TABS: 1000.0000 [IU] | ORAL_TABLET | Freq: Every day | ORAL | Status: DC

## 2012-12-29 NOTE — Progress Notes (Signed)
Patient ID: ANNA-MARIE COLLER, female   DOB: 1975/08/20, 37 y.o.   MRN: [id number]  Internal Medicine Clinic Visit    HPI:  Kanesha R Allen-Bruce is a 37 y.o. year old female with a history of migraine headaches, early surgery-induced menopause, endometriosis s/p hysterectomy and bl oophorectomy.  She presents to clinic with a chief complaint of hot flashes. She states that she had hot flashes several years ago and was well controlled on Premarin. She has not had any for them in the past year, however, she started to get hot flashes again and requests to be back on Premarin. Patient states that she has several episodes of hot flashes a day which included flushing, sweating, feeling uncomfortable. She states that this gets worse with the hot weather.  Patient also complains of some left-sided body pain. She states that she has had a constellation of symptoms over the past week that are similar to what her husband and her colleague had 2 weeks ago. She has been experiencing some left arm and left leg pain as well as left-sided neck pain. She also reports a burning/tingling sensation on the left side of her nostril which comes and goes. At first, she thought that she slept on her neck wrong and that's why she's having these symptoms. However, at did not go away in the past several days. She feels like her neck is stiff, she has tried heating pads as well as ibuprofen with mild bleed. She also reports some fatigue, nausea, headache abdominal pain and upset stomach. She had several episodes of diarrhea on Friday. She has had some chills and she has chronic photophobia. Her husband and her colleague have recovered for the most part. They thought it was a viral illness.  She denies any melena, hematochezia, vomiting, hematemesis, dizziness, changes in vision. She has not had any rashes. No trouble walking. No weakness, no numbness.    Past Medical History  Diagnosis Date  . Osteoarthritis of hip   . History  of migraine headaches     Typical symptoms - bright lights, unilateral (starting behind her left ear), throbbing .  Marland Kitchen Surgical menopause 08/2007    On HRT. Occuring since 01/09 following TAH and R-salpingo-oophorectomy   . Tibial fracture 10/2008    Left - Sustained 2/2 fall down stairs. Patient is S/P closed internal fixation with Smith Nephew tibial nail locked proximal and distal  . Endometriosis     S/P TAH and salpingo-oophorectomy, right  . Chronic cough   . Gastritis     Thought due to chronic NSAID use 2/2 pain from her congenital hip abnormality  . Epigastric pain     Chronic, thought 2/2 gastritis, EGD (07/14/2011) showing chronic duodenitis, H.pyloir neg // Repeat EGD (09/2011) - esophagealring, sessible polyp in stomach body, hiatal hernia - rec ad probiotic, await bx  . 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 With chronic OA of hip  . Vitamin D deficiency 11/2009    Vit D level 15 in 11/2009  . Closed fibular fracture 10/2008    Left - Sustained 2/2 fall down stairs (same fall as tibular fracture). S/P internal fixation.  . Hidradenitis suppurativa 05/2006  . Protrusio acetabuli     diagnosed at age 6  . Ovarian cancer 1995  . Headache     Migraines  . Duodenitis     EGD (07/14/2011) showing chronic duodenitis, H.pyloir neg  . Esophageal ring  s/p dilatation during EGD (07/2011) - Dr. Darrick Penna  . Anxiety     pt denies having dx of anxiety  . Sessile colonic polyp     noted 09/2011 -- > rec colonoscopy in 10 years, 2023  . Internal hemorrhoids     Past Surgical History  Procedure Laterality Date  . Cesarean section  08/2005    Primary low transverse  . Tubal ligation  08/2005    Right tubal ligation  . Laparoscopy  10/2000    Operative laparoscopy with lysis of right adnexal adhesions and uterointestinal adhesions  . Other surgical history  02/2009    Closed treatment internal fixation, left  tibia with Katrinka Blazing and Nephew tibial nail locked proximal and distal  . Total abdominal hysterectomy w/ bilateral salpingoophorectomy  08/2007    for endometriosis. With concurrent right salpingo-oophorectomy  (2009), left oophorectomy (1995)  . Left tib fib    . Joint replacement      right hip  . Fracture surgery      Right hip  . Colonoscopy  09/22/2011    Procedure: COLONOSCOPY;  Surgeon: Arlyce Harman, MD;  Location: [facility];  Service: Endoscopy;  Laterality: N/A;  9:15     ROS:  A complete review of systems was otherwise negative, except as noted in the HPI.  Allergies: Pineapple; Morphine and related; and Tape  Medications: Current Outpatient Prescriptions  Medication Sig Dispense Refill  . Calcium Carb-Cholecalciferol (CALCIUM-VITAMIN D) 500-400 MG-UNIT TABS Take 1,000 mg by mouth daily. Take 2 tablets once daily - after you have finished your high dose vitamin D.  60 tablet  1  . ergocalciferol (VITAMIN D2) 50000 UNITS capsule Take 1 capsule (50,000 Units total) by mouth once a week. Take with full glass of water  8 capsule  0  . ibuprofen (ADVIL,MOTRIN) 200 MG tablet Take 600 mg by mouth every 8 (eight) hours as needed for pain.      . SUMAtriptan (IMITREX) 50 MG tablet Take 50 mg by mouth every 2 (two) hours as needed. For headache       No current facility-administered medications for this visit.    History   Social History  . Marital Status: Married    Spouse Name: N/A    Number of Children: 2  . Years of Education: bs degree   Occupational History  . Stay-at-home mom    Social History Main Topics  . Smoking status: Never Smoker   . Smokeless tobacco: Not on file  . Alcohol Use: No  . Drug Use: No  . Sexually Active: Not on file   Other Topics Concern  . Not on file   Social History Narrative   Insurance: Medicare.   Patient is married with two children, [place] and [place]ana.           family history includes Arthritis in an unspecified family  member; Breast cancer (age of onset: 54) in her sister; Cancer in her other and sister; Diabetes in her other; Diabetes type II in an unspecified family member; and Lupus in her cousin.  There is no history of Colon cancer and Liver disease.  Physical Exam Blood pressure 150/93, pulse 68, temperature 98 F (36.7 C), temperature source Oral, height 5\' 11"  (1.803 m), weight 182 lb 8 oz (82.781 kg), SpO2 97.00%. General:  No acute distress, alert and oriented x 3, well-appearing AAF HEENT:  PERRL, 2 mm, EOMI, no lymphadenopathy, moist mucous membranes, oropharynx clear, no lesions, no exudate, no erythema  Cardiovascular:  Regular rate and rhythm, no murmurs, rubs or gallops Respiratory:  Clear to auscultation bilaterally, no wheezes, rales, or rhonchi, good effort Abdomen:  Soft, nondistended, mild tenderness to palpation, no rebound or guarding, positive bowel sounds Extremities:  Warm and well-perfused, no clubbing, cyanosis, or edema.  Skin: Warm, dry, no rashes Neuro: Not anxious appearing, no depressed mood, normal affect MSK: Strength is 5 out of 5 throughout both upper and lower extremities, symmetrical. Sensation to light touch is intact throughout. Cranial nerves are intact, no abnormalities noted. Gait is intact. Her shoulder muscles and arm is tender to touch. She has no pain with range of motion of the neck, no muscle spasms appreciated, chin to chest is tolerated.  Labs: Lab Results  Component Value Date   CREATININE 0.69 09/30/2012   BUN 7 09/30/2012   NA 141 09/30/2012   K 3.9 09/30/2012   CL 105 09/30/2012   CO2 27 09/30/2012   Lab Results  Component Value Date   WBC 8.2 09/30/2012   HGB 11.5* 09/30/2012   HCT 35.1* 09/30/2012   MCV 80.7 09/30/2012   PLT 308 09/30/2012      Assessment and Plan:    FOLLOWUP: Nohealani R Allen-Bruce will follow back up in our clinic in approximately one to 2 months. Kristyn R Allen-Bruce knows to call out clinic in the meantime with any questions or  new issues.

## 2012-12-29 NOTE — Patient Instructions (Addendum)
Please return to clinic in 2 weeks if you are not feeling better.  The following prescriptions were sent to her pharmacy: Premarin 0.625 mg  Please go to the store and buy over-the-counter vitamin D supplementation. You can take [phone] units of vitamin D per day.  Return to clinic in 2 months

## 2012-12-30 DIAGNOSIS — R52 Pain, unspecified: Secondary | ICD-10-CM | POA: Insufficient documentation

## 2012-12-30 NOTE — Assessment & Plan Note (Signed)
Patient's vitamin D level was low in April. She has finished a course of 50,000 units of vitamin D every week as prescribed by Dr. Thad Ranger.  -Start daily maintenance vitamin D supplementation, 1000 units per day

## 2012-12-30 NOTE — Assessment & Plan Note (Signed)
Visit presents with inconsistent constellation of symptoms including left-sided body pain, nasal tingling, chills. She does not have any symptoms on the right side of her body. Interestingly, her husband had a similar phenomenon happened 2 weeks ago which is now resolved. She also describes similar symptoms in a colleague at work. Unclear etiology, possibly viral infection. She did have some mild diarrhea last week. Patient does not have any nausea or vomiting, she is afebrile, vitals are stable.  -Asked patient to monitor symptoms, she knows to come in or seek medical attention medially if her symptoms worsen or do not improve within a week or 2.

## 2012-12-30 NOTE — Progress Notes (Signed)
Case discussed with Dr.Kesty soon after the resident saw the patient.  We reviewed the resident's history and exam and pertinent patient test results.  I agree with the assessment, diagnosis and plan of care documented in the resident's note. 

## 2012-12-30 NOTE — Assessment & Plan Note (Signed)
Patient continues to have hot flashes that are bothersome to her everyday life. She is s/p hysterectomy with BSO from endometriosis.  -Patient given prescription for Premarin, may need to adjust dose according to symptoms -Followup in 2 months, sooner if needed

## 2013-02-17 ENCOUNTER — Encounter: Payer: Self-pay | Admitting: Gastroenterology

## 2013-02-21 ENCOUNTER — Ambulatory Visit (INDEPENDENT_AMBULATORY_CARE_PROVIDER_SITE_OTHER): Payer: Managed Care, Other (non HMO) | Admitting: Gastroenterology

## 2013-02-21 ENCOUNTER — Encounter: Payer: Self-pay | Admitting: Gastroenterology

## 2013-02-21 VITALS — BP 145/88 | HR 69 | Temp 98.3°F | Ht 69.0 in | Wt 178.2 lb

## 2013-02-21 DIAGNOSIS — K5900 Constipation, unspecified: Secondary | ICD-10-CM

## 2013-02-21 DIAGNOSIS — D649 Anemia, unspecified: Secondary | ICD-10-CM | POA: Diagnosis not present

## 2013-02-21 MED ORDER — LINACLOTIDE 290 MCG PO CAPS: 1.0000 | ORAL_CAPSULE | Freq: Every day | ORAL | Status: DC

## 2013-02-21 NOTE — Progress Notes (Signed)
Referring Provider: Aletta Edouard, MD Primary Care Physician:  Aletta Edouard, MD Primary GI: Dr. Darrick Penna   Chief Complaint  Patient presents with  . Medication Refill  . Gastrophageal Reflux    HPI:   37 year old female returns today with a history mild normocytic anemia, normal iron/ferritin, abdominal pain. Last TCS/EGD in Feb 2013 with mild gastritis, dilation of esophageal ring, benign polyp and internal hemorrhoids.    Worried about celiac disease. Notes abdominal pain "a whole lot", can't really put finger on it. Trying to watch  what she eats. Can't pinpoint anything. No BM for about 1 week at times. Notes lower abdominal pain and pain right above umbilicus. Constant. Not usually better after BM. No rectal bleeding. Gaining weight. Up 9 lbs from July 2013. Worried about adhesions, wondering if this is a factor. No dysphagia.   Normal CT Feb 2014.   Past Medical History  Diagnosis Date  . Osteoarthritis of hip   . History of migraine headaches     Typical symptoms - bright lights, unilateral (starting behind her left ear), throbbing .  Marland Kitchen Surgical menopause 08/2007    On HRT. Occuring since 01/09 following TAH and R-salpingo-oophorectomy   . Tibial fracture 10/2008    Left - Sustained 2/2 fall down stairs. Patient is S/P closed internal fixation with Smith Nephew tibial nail locked proximal and distal  . Endometriosis     S/P TAH and salpingo-oophorectomy, right  . Chronic cough   . Gastritis     Thought due to chronic NSAID use 2/2 pain from her congenital hip abnormality  . Epigastric pain     Chronic, thought 2/2 gastritis, EGD (07/14/2011) showing chronic duodenitis, H.pyloir neg // Repeat EGD (09/2011) - esophagealring, sessible polyp in stomach body, hiatal hernia - rec ad probiotic, await bx  . 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 With chronic OA of hip  . Vitamin D deficiency 11/2009    Vit  D level 15 in 11/2009  . Closed fibular fracture 10/2008    Left - Sustained 2/2 fall down stairs (same fall as tibular fracture). S/P internal fixation.  . Hidradenitis suppurativa 05/2006  . Protrusio acetabuli     diagnosed at age 58  . Ovarian cancer 1995  . Headache(784.0)     Migraines  . Duodenitis     EGD (07/14/2011) showing chronic duodenitis, H.pyloir neg  . Esophageal ring     s/p dilatation during EGD (07/2011) - Dr. Darrick Penna  . Anxiety     pt denies having dx of anxiety  . Sessile colonic polyp     noted 09/2011 -- > rec colonoscopy in 10 years, 2023  . Internal hemorrhoids     Past Surgical History  Procedure Laterality Date  . Cesarean section  08/2005    Primary low transverse  . Tubal ligation  08/2005    Right tubal ligation  . Laparoscopy  10/2000    Operative laparoscopy with lysis of right adnexal adhesions and uterointestinal adhesions  . Other surgical history  02/2009    Closed treatment internal fixation, left tibia with Katrinka Blazing and Nephew tibial nail locked proximal and distal  . Total abdominal hysterectomy w/ bilateral salpingoophorectomy  08/2007    for endometriosis. With concurrent right salpingo-oophorectomy  (2009), left oophorectomy (1995)  . Left tib fib    . Joint replacement      right hip  . Fracture surgery  Right hip  . Colonoscopy  09/22/2011    [id number] polyp in the rectum/Internal hemorrhoids, benign path  . Esophagogastroduodenoscopy  09/22/2011    UJW:JXBJ, esophageal/Sessile polyp in the body of the stomach/Hiatal hernia/ABDOMINAL PAIN LIKELY FUNCTIONAL V. POST-INFECTIOUS IBS, path: mild chronic gastritis    Current Outpatient Prescriptions  Medication Sig Dispense Refill  . SUMAtriptan (IMITREX) 50 MG tablet Take 50 mg by mouth every 2 (two) hours as needed. For headache      . cholecalciferol (VITAMIN D) 1000 UNITS tablet Take 1 tablet (1,000 Units total) by mouth daily.      Marland Kitchen estrogens, conjugated, (PREMARIN) 0.625 MG  tablet Take 1 tablet (0.625 mg total) by mouth daily. Take daily for 25 days then do not take for 5 days.  30 tablet  11  . ibuprofen (ADVIL,MOTRIN) 200 MG tablet Take 600 mg by mouth every 8 (eight) hours as needed for pain.      . Linaclotide 290 MCG CAPS Take 1 capsule by mouth daily. Take 30 minutes before breakfast daily  30 capsule  3   No current facility-administered medications for this visit.    Allergies as of 02/21/2013 - Review Complete 02/21/2013  Allergen Reaction Noted  . Pineapple Shortness Of Breath and Swelling 10/02/2010  . Morphine and related Itching 09/13/2010  . Tape Swelling 03/03/2012    Family History  Problem Relation Age of Onset  . Diabetes type II    . Arthritis    . Breast cancer Sister 35    remission  . Cancer Sister     breast  . Colon cancer Neg Hx   . Liver disease Neg Hx   . Diabetes Other   . Cancer Other     colon cancer  . Lupus Cousin     History   Social History  . Marital Status: Married    Spouse Name: N/A    Number of Children: 2  . Years of Education: bs degree   Occupational History  . Stay-at-home mom    Social History Main Topics  . Smoking status: Never Smoker   . Smokeless tobacco: None  . Alcohol Use: No  . Drug Use: No  . Sexually Active: None   Other Topics Concern  . None   Social History Narrative   Insurance: Medicare.   Patient is married with two children, [place] and [place].           Review of Systems: Negative unless mentioned in HPI.   Physical Exam: BP 145/88  Pulse 69  Temp(Src) 98.3 F (36.8 C) (Oral)  Ht 5\' 9"  (1.753 m)  Wt 178 lb 3.2 oz (80.831 kg)  BMI 26.3 kg/m2 General:   Alert and oriented. No distress noted. Pleasant and cooperative.  Head:  Normocephalic and atraumatic. Eyes:  Conjuctiva clear without scleral icterus. Mouth:  Oral mucosa pink and moist. Good dentition. No lesions. Neck:  Supple, without mass or thyromegaly. Heart:  S1, S2 present without murmurs,  rubs, or gallops. Regular rate and rhythm. Abdomen:  +BS, soft, very mild TTP epigastric region and lower abdomen and non-distended. No rebound or guarding. No HSM or masses noted. Msk:  Symmetrical without gross deformities. Normal posture. Extremities:  Without edema. Neurologic:  Alert and  oriented x4;  grossly normal neurologically. Skin:  Intact without significant lesions or rashes. Cervical Nodes:  No significant cervical adenopathy. Psych:  Alert and cooperative. Normal mood and affect.   Lab Results  Component Value Date   WBC 8.2  09/30/2012   HGB 11.5* 09/30/2012   HCT 35.1* 09/30/2012   MCV 80.7 09/30/2012   PLT 308 09/30/2012   Lab Results  Component Value Date   IRON 75 01/29/2012   TIBC 377 01/29/2012   FERRITIN 43 09/22/2011

## 2013-02-21 NOTE — Patient Instructions (Addendum)
Start taking Linzess each morning, 30 minutes before breakfast. This is for constipation. We have provided a month supply voucher for you.  Continue taking a probiotic each day.   Please have blood work done. We will call with the results.  We will have you return in 6 weeks.

## 2013-02-22 ENCOUNTER — Encounter: Payer: Self-pay | Admitting: Gastroenterology

## 2013-02-22 NOTE — Assessment & Plan Note (Signed)
37 year old female with history of IBS, now with constipation. Likely chronic abdominal pain secondary to constipation; possibility of adhesive disease. Patient is concerned about celiac disease. Doubt dealing with this, but we will draw serologies per her request. Colonoscopy up-to-date, next in 2023.   Start Linzess 290 mcg once daily Start Restora or equivalent probiotic. Samples provided 6 week return

## 2013-02-22 NOTE — Assessment & Plan Note (Signed)
EGD/TCS on file from Feb 2013. Iron and ferritin normal in June 2013. Most recent Hgb in Feb 2014 11.5, which is around her normal baseline. No evidence of melena or hematochezia. Return in 6 weeks for constipation f/u.

## 2013-02-22 NOTE — Progress Notes (Signed)
Cc PCP 

## 2013-02-23 LAB — IGA: IgA: 186 mg/dL (ref 69–380)

## 2013-02-23 LAB — TISSUE TRANSGLUTAMINASE, IGA: Tissue Transglutaminase Ab, IgA: 2.4 U/mL (ref <20)

## 2013-03-01 NOTE — Progress Notes (Signed)
Quick Note:  Negative celiac serology. ______

## 2013-03-02 NOTE — Progress Notes (Signed)
Quick Note:  Pt returned call and was informed. ______ 

## 2013-03-02 NOTE — Progress Notes (Signed)
Quick Note:  LMOM to call. ______ 

## 2013-03-14 ENCOUNTER — Encounter (HOSPITAL_COMMUNITY): Payer: Self-pay

## 2013-03-14 ENCOUNTER — Emergency Department (INDEPENDENT_AMBULATORY_CARE_PROVIDER_SITE_OTHER): Payer: Managed Care, Other (non HMO)

## 2013-03-14 ENCOUNTER — Emergency Department (INDEPENDENT_AMBULATORY_CARE_PROVIDER_SITE_OTHER)
Admission: EM | Admit: 2013-03-14 | Discharge: 2013-03-14 | Disposition: A | Discharge status: Home / Self Care | Payer: Medicare Other | Source: Home / Self Care | Attending: Emergency Medicine | Admitting: Emergency Medicine

## 2013-03-14 DIAGNOSIS — G8929 Other chronic pain: Secondary | ICD-10-CM

## 2013-03-14 DIAGNOSIS — R52 Pain, unspecified: Secondary | ICD-10-CM

## 2013-03-14 DIAGNOSIS — E8941 Symptomatic postprocedural ovarian failure: Secondary | ICD-10-CM

## 2013-03-14 DIAGNOSIS — R300 Dysuria: Secondary | ICD-10-CM

## 2013-03-14 DIAGNOSIS — K5900 Constipation, unspecified: Secondary | ICD-10-CM

## 2013-03-14 DIAGNOSIS — J020 Streptococcal pharyngitis: Secondary | ICD-10-CM

## 2013-03-14 DIAGNOSIS — I10 Essential (primary) hypertension: Secondary | ICD-10-CM

## 2013-03-14 DIAGNOSIS — G43909 Migraine, unspecified, not intractable, without status migrainosus: Secondary | ICD-10-CM

## 2013-03-14 DIAGNOSIS — M25569 Pain in unspecified knee: Secondary | ICD-10-CM

## 2013-03-14 DIAGNOSIS — D649 Anemia, unspecified: Secondary | ICD-10-CM

## 2013-03-14 DIAGNOSIS — R109 Unspecified abdominal pain: Secondary | ICD-10-CM

## 2013-03-14 DIAGNOSIS — N390 Urinary tract infection, site not specified: Secondary | ICD-10-CM

## 2013-03-14 DIAGNOSIS — R079 Chest pain, unspecified: Secondary | ICD-10-CM | POA: Diagnosis not present

## 2013-03-14 DIAGNOSIS — R0789 Other chest pain: Secondary | ICD-10-CM

## 2013-03-14 DIAGNOSIS — E559 Vitamin D deficiency, unspecified: Secondary | ICD-10-CM

## 2013-03-14 LAB — POCT URINALYSIS DIP (DEVICE)
Glucose, UA: NEGATIVE mg/dL
Nitrite: NEGATIVE
Urobilinogen, UA: 0.2 mg/dL (ref 0.0–1.0)

## 2013-03-14 LAB — POCT PREGNANCY, URINE: Preg Test, Ur: NEGATIVE

## 2013-03-14 MED ORDER — HYDROCODONE-ACETAMINOPHEN 5-325 MG PO TABS: ORAL_TABLET | ORAL | Status: DC

## 2013-03-14 MED ORDER — IBUPROFEN 800 MG PO TABS
ORAL_TABLET | ORAL | Status: AC
  Filled 2013-03-14: qty 1

## 2013-03-14 MED ORDER — HYDROCODONE-ACETAMINOPHEN 5-325 MG PO TABS
ORAL_TABLET | ORAL | Status: AC
  Filled 2013-03-14: qty 1

## 2013-03-14 MED ORDER — MELOXICAM 15 MG PO TABS: 15.0000 mg | ORAL_TABLET | Freq: Every day | ORAL | Status: DC

## 2013-03-14 MED ORDER — HYDROCODONE-ACETAMINOPHEN 5-325 MG PO TABS
1.0000 | ORAL_TABLET | Freq: Once | ORAL | Status: AC
  Administered 2013-03-14: 1 via ORAL

## 2013-03-14 MED ORDER — IBUPROFEN 800 MG PO TABS
800.0000 mg | ORAL_TABLET | Freq: Once | ORAL | Status: AC
  Administered 2013-03-14: 800 mg via ORAL

## 2013-03-14 MED ORDER — CEPHALEXIN 500 MG PO CAPS: 500.0000 mg | ORAL_CAPSULE | Freq: Three times a day (TID) | ORAL | Status: DC

## 2013-03-14 MED ORDER — CYCLOBENZAPRINE HCL 5 MG PO TABS: 5.0000 mg | ORAL_TABLET | Freq: Three times a day (TID) | ORAL | Status: DC | PRN

## 2013-03-14 NOTE — ED Notes (Signed)
Pain lower abdominal area w radiation to back, pain left chest w radiation to shoulder; NAD

## 2013-03-14 NOTE — ED Provider Notes (Signed)
Chief Complaint:   Chief Complaint  Patient presents with  . Abdominal Pain    History of Present Illness:   [name] is a 37 year old female who presents today with multiple problems: Back pain, left chest pain, frequent urination, and headache.  1. Lower back pain: This is been going on for a week and a half. She denies any injury. It radiates around the size of her abdomen but not down her legs. It feels like a dull pain is rated 8/10 in intensity. It's been associated with chills and some periumbilical pain. She denies any previous visit history of lower back pain, back injury, or HNP.  2. Left chest pain: The patient states she has had pain in the left pectoral area radiating to the shoulder blade today after using some watermelon. This began about 8:45 AM and is still going on right now. The pain is pleuritic and associated with lightheadedness and some sweats. She denies any shortness of breath, coughing, wheezing, or hemoptysis. No prior history of DVT or blood clots. No cardiac history. No associated nausea.  3. Urinary symptoms: She has had a one half week history of frequent urination, urgency, and urinary odor. She has a history of urinary tract infection within the past year. She denies any GYN complaints.  4. Headache: This has been going on for the past several days. She has a history of migraines in the past. She states she's not sleeping well at night. She denies any neurological symptoms.  Review of Systems:  Other than noted above, the patient denies any of the following symptoms. Systemic:  No fever, chills, sweats, fatigue, myalgias, headache, or anorexia. Eye:  No redness, pain or drainage. ENT:  No earache, nasal congestion, rhinorrhea, sinus pressure, or sore throat. Lungs:  No cough, sputum production, wheezing, shortness of breath.  Cardiovascular:  No chest pain, palpitations, or syncope. GI:  No nausea, vomiting, abdominal pain or diarrhea. GU:  No  dysuria, frequency, or hematuria. Skin:  No rash or pruritis.  PMFSH:  Past medical history, family history, social history, meds, and allergies were reviewed.  She has no allergies. She takes a medication for constipation. She has a history of migraines, constipation, and a congenital hip problem.  Physical Exam:   Vital signs:  BP 162/101  Pulse 58  Temp(Src) 97.7 F (36.5 C) (Oral)  Resp 16  SpO2 100% General:  Alert, in no distress. Eye:  PERRL, full EOMs.  Lids and conjunctivas were normal. ENT:  TMs and canals were normal, without erythema or inflammation.  Nasal mucosa was clear and uncongested, without drainage.  Mucous membranes were moist.  Pharynx was clear, without exudate or drainage.  There were no oral ulcerations or lesions. Neck:  Supple, no adenopathy, tenderness or mass. Thyroid was normal. Lungs:  No respiratory distress.  Lungs were clear to auscultation, without wheezes, rales or rhonchi.  Breath sounds were clear and equal bilaterally. Heart:  Regular rhythm, without gallops, murmers or rubs. Abdomen:  Soft, flat, and non-tender to palpation.  No hepatosplenomagaly or mass. Skin:  Clear, warm, and dry, without rash or lesions.  Labs:   Results for orders placed during the hospital encounter of 03/14/13  D-DIMER, QUANTITATIVE      Result Value Range   D-Dimer, Quant <0.27  0.00 - 0.48 ug/mL-FEU  POCT URINALYSIS DIP (DEVICE)      Result Value Range   Glucose, UA NEGATIVE  NEGATIVE mg/dL   Bilirubin Urine NEGATIVE  NEGATIVE  Ketones, ur NEGATIVE  NEGATIVE mg/dL   Specific Gravity, Urine 1.010  1.005 - 1.030   Hgb urine dipstick TRACE (*) NEGATIVE   pH 6.0  5.0 - 8.0   Protein, ur NEGATIVE  NEGATIVE mg/dL   Urobilinogen, UA 0.2  0.0 - 1.0 mg/dL   Nitrite NEGATIVE  NEGATIVE   Leukocytes, UA SMALL (*) NEGATIVE  POCT PREGNANCY, URINE      Result Value Range   Preg Test, Ur NEGATIVE  NEGATIVE     Radiology:  Dg Chest 2 View  03/14/2013   *RADIOLOGY REPORT*   Clinical Data: Left chest pain.  CHEST - 2 VIEW  Comparison: 02/20/2009  Findings: Heart and mediastinal contours are within normal limits. No focal opacities or effusions.  No acute bony abnormality.  IMPRESSION: No active cardiopulmonary disease.   Original Report Authenticated By: Charlett Nose, M.D.   EKG Results:  Date: 03/14/2013  Rate: 57  Rhythm: normal sinus rhythm  QRS Axis: normal  Intervals: normal  ST/T Wave abnormalities: normal  Conduction Disutrbances:none  Narrative Interpretation: Normal sinus rhythm, normal EKG.  Old EKG Reviewed: none available  Assessment:  The primary encounter diagnosis was UTI (lower urinary tract infection). A diagnosis of Musculoskeletal chest pain was also pertinent to this visit.   No evidence of cardiac or pulmonary cause for her chest pain, most likely musculoskeletal.her back pain urinary symptoms may be due to urinary tract infection. Headache appears to be a mild migraine headache.   Plan:   1.  The following meds were prescribed:   Discharge Medication List as of 03/14/2013  2:05 PM    START taking these medications   Details  cephALEXin (KEFLEX) 500 MG capsule Take 1 capsule (500 mg total) by mouth 3 (three) times daily., Starting 03/14/2013, Until Discontinued, Normal    cyclobenzaprine (FLEXERIL) 5 MG tablet Take 1 tablet (5 mg total) by mouth 3 (three) times daily as needed for muscle spasms., Starting 03/14/2013, Until Discontinued, Normal    HYDROcodone-acetaminophen (NORCO/VICODIN) 5-325 MG per tablet 1 to 2 tabs every 4 to 6 hours as needed for pain., Print    meloxicam (MOBIC) 15 MG tablet Take 1 tablet (15 mg total) by mouth daily., Starting 03/14/2013, Until Discontinued, Normal       2.  The patient was instructed in symptomatic care and handouts were given. 3.  The patient was told to return if becoming worse in any way, if no better in 3 or 4 days, and given some red flag symptoms such as worsening pain, difficulty breathing,  fever, that would indicate earlier return. 4.  Follow up here if necessary.       Reuben Likes, MD 03/14/13 [phone]

## 2013-03-15 LAB — URINE CULTURE

## 2013-03-28 ENCOUNTER — Ambulatory Visit (INDEPENDENT_AMBULATORY_CARE_PROVIDER_SITE_OTHER): Payer: Medicare Other | Admitting: Internal Medicine

## 2013-03-28 ENCOUNTER — Encounter: Payer: Self-pay | Admitting: Internal Medicine

## 2013-03-28 VITALS — BP 153/99 | HR 70 | Temp 97.4°F | Ht 71.5 in | Wt 182.5 lb

## 2013-03-28 DIAGNOSIS — K2940 Chronic atrophic gastritis without bleeding: Secondary | ICD-10-CM | POA: Diagnosis not present

## 2013-03-28 DIAGNOSIS — E559 Vitamin D deficiency, unspecified: Secondary | ICD-10-CM | POA: Diagnosis not present

## 2013-03-28 DIAGNOSIS — K5900 Constipation, unspecified: Secondary | ICD-10-CM

## 2013-03-28 DIAGNOSIS — I10 Essential (primary) hypertension: Secondary | ICD-10-CM

## 2013-03-28 DIAGNOSIS — N390 Urinary tract infection, site not specified: Secondary | ICD-10-CM | POA: Diagnosis not present

## 2013-03-28 DIAGNOSIS — K2950 Unspecified chronic gastritis without bleeding: Secondary | ICD-10-CM

## 2013-03-28 DIAGNOSIS — E8941 Symptomatic postprocedural ovarian failure: Secondary | ICD-10-CM

## 2013-03-28 LAB — BASIC METABOLIC PANEL WITH GFR
BUN: 10 mg/dL (ref 6–23)
CO2: 29 mEq/L (ref 19–32)
Chloride: 102 mEq/L (ref 96–112)
Creat: 0.81 mg/dL (ref 0.50–1.10)
Glucose, Bld: 93 mg/dL (ref 70–99)
Potassium: 3.6 mEq/L (ref 3.5–5.3)

## 2013-03-28 LAB — CBC
HCT: 36.3 % (ref 36.0–46.0)
MCV: 77.6 fL — ABNORMAL LOW (ref 78.0–100.0)
Platelets: 310 10*3/uL (ref 150–400)
RBC: 4.68 MIL/uL (ref 3.87–5.11)
WBC: 8.7 10*3/uL (ref 4.0–10.5)

## 2013-03-28 LAB — IRON AND TIBC
%SAT: 24 % (ref 20–55)
TIBC: 346 ug/dL (ref 250–470)
UIBC: 264 ug/dL (ref 125–400)

## 2013-03-28 MED ORDER — ERGOCALCIFEROL 1.25 MG (50000 UT) PO CAPS: 50000.0000 [IU] | ORAL_CAPSULE | ORAL | Status: DC

## 2013-03-28 MED ORDER — LISINOPRIL 5 MG PO TABS: 5.0000 mg | ORAL_TABLET | Freq: Every day | ORAL | Status: DC

## 2013-03-28 NOTE — Patient Instructions (Addendum)
Please follow up with your doctor at [facility]  We will do a vitamin D check, blood check for iron deficiency and a urine exam.  I will call with results  Please take Vitamin D as prescribed Please follow up in one month.     Hypertension Hypertension is another name for high blood pressure. High blood pressure may mean that your heart needs to work harder to pump blood. Blood pressure consists of two numbers, which includes a higher number over a lower number (example: 110/72). HOME CARE   Make lifestyle changes as told by your doctor. This may include weight loss and exercise.  Take your blood pressure medicine every day.  Limit how much salt you use.  Stop smoking if you smoke.  Do not use drugs.  Talk to your doctor if you are using decongestants or birth control pills. These medicines might make blood pressure higher.  Females should not drink more than 1 alcoholic drink per day. Males should not drink more than 2 alcoholic drinks per day.  See your doctor as told. GET HELP RIGHT AWAY IF:   You have a blood pressure reading with a top number of 180 or higher.  You get a very bad headache.  You get blurred or changing vision.  You feel confused.  You feel weak, numb, or faint.  You get chest or belly (abdominal) pain.  You throw up (vomit).  You cannot breathe very well. MAKE SURE YOU:   Understand these instructions.  Will watch your condition.  Will get help right away if you are not doing well or get worse. Document Released: 01/14/2008 Document Revised: 10/20/2011 Document Reviewed: 01/14/2008 [employer] Patient Information 2014 [place], [place].   Lisinopril tablets What is this medicine? LISINOPRIL (lyse IN oh pril) is an ACE inhibitor. This medicine is used to treat high blood pressure and heart failure. It is also used to protect the heart immediately after a heart attack. This medicine may be used for other purposes; ask your health care provider  or pharmacist if you have questions. What should I tell my health care provider before I take this medicine? They need to know if you have any of these conditions: -diabetes -heart or blood vessel disease -immune system disease like lupus or scleroderma -kidney disease -low blood pressure -previous swelling of the tongue, face, or lips with difficulty breathing, difficulty swallowing, hoarseness, or tightening of the throat -an unusual or allergic reaction to lisinopril, other ACE inhibitors, insect venom, foods, dyes, or preservatives -pregnant or trying to get pregnant -breast-feeding How should I use this medicine? Take this medicine by mouth with a glass of water. Follow the directions on your prescription label. You may take this medicine with or without food. Take your medicine at regular intervals. Do not stop taking this medicine except on the advice of your doctor or health care professional. Talk to your pediatrician regarding the use of this medicine in children. Special care may be needed. While this drug may be prescribed for children as young as 26 years of age for selected conditions, precautions do apply. Overdosage: If you think you have taken too much of this medicine contact a poison control center or emergency room at once. NOTE: This medicine is only for you. Do not share this medicine with others. What if I miss a dose? If you miss a dose, take it as soon as you can. If it is almost time for your next dose, take only that dose. Do  not take double or extra doses. What may interact with this medicine? -diuretics -lithium -NSAIDs, medicines for pain and inflammation, like ibuprofen or naproxen -over-the-counter herbal supplements like hawthorn -potassium salts or potassium supplements -salt substitutes This list may not describe all possible interactions. Give your health care provider a list of all the medicines, herbs, non-prescription drugs, or dietary supplements you  use. Also tell them if you smoke, drink alcohol, or use illegal drugs. Some items may interact with your medicine. What should I watch for while using this medicine? Visit your doctor or health care professional for regular check ups. Check your blood pressure as directed. Ask your doctor what your blood pressure should be, and when you should contact him or her. Call your doctor or health care professional if you notice an irregular or fast heart beat. Women should inform their doctor if they wish to become pregnant or think they might be pregnant. There is a potential for serious side effects to an unborn child. Talk to your health care professional or pharmacist for more information. Check with your doctor or health care professional if you get an attack of severe diarrhea, nausea and vomiting, or if you sweat a lot. The loss of too much body fluid can make it dangerous for you to take this medicine. You may get drowsy or dizzy. Do not drive, use machinery, or do anything that needs mental alertness until you know how this drug affects you. Do not stand or sit up quickly, especially if you are an older patient. This reduces the risk of dizzy or fainting spells. Alcohol can make you more drowsy and dizzy. Avoid alcoholic drinks. Avoid salt substitutes unless you are told otherwise by your doctor or health care professional. Do not treat yourself for coughs, colds, or pain while you are taking this medicine without asking your doctor or health care professional for advice. Some ingredients may increase your blood pressure. What side effects may I notice from receiving this medicine? Side effects that you should report to your doctor or health care professional as soon as possible: -abdominal pain with or without nausea or vomiting -allergic reactions like skin rash or hives, swelling of the hands, feet, face, lips, throat, or tongue -dark urine -difficulty breathing -dizzy, lightheaded or fainting  spell -fever or sore throat -irregular heart beat, chest pain -pain or difficulty passing urine -redness, blistering, peeling or loosening of the skin, including inside the mouth -unusually weak -yellowing of the eyes or skin Side effects that usually do not require medical attention (report to your doctor or health care professional if they continue or are bothersome): -change in taste -cough -decreased sexual function or desire -headache -sun sensitivity -tiredness This list may not describe all possible side effects. Call your doctor for medical advice about side effects. You may report side effects to FDA at 1-800-FDA-1088. Where should I keep my medicine? Keep out of the reach of children. Store at room temperature between 15 and 30 degrees C (59 and 86 degrees F). Protect from moisture. Keep container tightly closed. Throw away any unused medicine after the expiration date. NOTE: This sheet is a summary. It may not cover all possible information. If you have questions about this medicine, talk to your doctor, pharmacist, or health care provider.  2013, [employer]. (01/31/2008 5:36:32 PM)

## 2013-03-29 ENCOUNTER — Encounter: Payer: Self-pay | Admitting: Internal Medicine

## 2013-03-29 LAB — URINALYSIS, ROUTINE W REFLEX MICROSCOPIC
Bilirubin Urine: NEGATIVE
Glucose, UA: NEGATIVE mg/dL
Hgb urine dipstick: NEGATIVE
Ketones, ur: NEGATIVE mg/dL
Nitrite: NEGATIVE
Protein, ur: NEGATIVE mg/dL
Specific Gravity, Urine: 1.026 (ref 1.005–1.030)
pH: 6 (ref 5.0–8.0)

## 2013-03-29 LAB — FERRITIN: Ferritin: 49 ng/mL (ref 10–291)

## 2013-03-29 LAB — URINALYSIS, MICROSCOPIC ONLY
Bacteria, UA: NONE SEEN
Casts: NONE SEEN

## 2013-03-29 NOTE — Assessment & Plan Note (Signed)
Her constipation is better with BM every two days. She ran out Linaclotide. She reports that she might not be able to afford it. It is unlikely that her current abdominal pain is related to constipation. Will continue to monitor. Encouraged her to follow up with GI appointment at the end of this month.

## 2013-03-29 NOTE — Progress Notes (Signed)
Case discussed with Dr.Kazibwe at the time of the visit.  We reviewed the resident's history and exam and pertinent patient test results.  I agree with the assessment, diagnosis, and plan of care documented in the resident's note.    

## 2013-03-29 NOTE — Assessment & Plan Note (Addendum)
Patient's BP is noted to be elevated on two recordings. She is hesitant about taking medications. I have give counseled about the need to start treating and she hesitantly agreed to take Lisinopril. She apparently had a weird reaction to Amlodipine a few years back. She generally has a history of noncompliance.   Plan. Will start lisinopril BMP today Advised the patient follow up in 2 weeks for creatinine and potassium level

## 2013-03-29 NOTE — Assessment & Plan Note (Signed)
She has an increased risk of osteoporosis given her gyn history with oophorectomy. Refilled her Vitamin D.

## 2013-03-29 NOTE — Assessment & Plan Note (Signed)
No symptoms at this time. Given her ongoing abdominal pain, I have ordered a repeat U/A and if this is normal she will be monitored if this is unremarkable, she will be evaluated with urinary symptoms.

## 2013-03-29 NOTE — Progress Notes (Signed)
Patient ID: [name], female   DOB: [birth year], 37 y.o.   MRN: [id number]   Subjective:   HPI: [name] is a 37 y.o. woman with past medical history of migraine headaches, endometriosis, status post TAH and salpingo-oophorectomy, hypertension, vitamin D deficiency, questionable diagnosis of IBS, and osteoarthritis of the hip, presents to the clinic with complaints of ongoing abdominal pain, and weight gain of more than 30 pounds over the last 1 year.  She was evaluated on 8/4 in urgent care for a possible diagnosis of a UTI, and she was given a course of Keflex, which she completed. She reports that currently she doesn't have any urinary symptoms and her bilateral flank pain has improved. No fevers, chills, rigors, fatigue, nausea, or vomiting. However, the patient reports ongoing mild to moderate abdominal pain, which is generalized, nonradiating, no relieving or exacerbating factors. This is a chronic problem without acute worsening. She has been previously evaluated by both gynecology and GI. EGD in 2012 revealed possible chronic gastritis, and chronic duodenitis. H. pylori was negative. Repeat EGD in 2013 was significant for esophageal ring, and the polyp in the stomach body, with a hiatal hernia. GI recommended probiotic. Her most GI evaluation on 02/22/2013 raised a question of IBS. A celiac panel was negative. At that time she was complaining of constipation, and she was given a prescription of Restoral and Linaclotide and a followup in 6 weeks.    She is also concerned that her weight is up by 30 pounds over the last 1 year and she is wonders whether this might be a symptom of a tumor or cancer. Patient has an extensive GYN history with the endometriosis and history of hysterectomy and bilateral salpingo-oophorectomy followed by postoperative menopause with symptoms. At some point she was on hormone replacement therapy, but she reports, that she's not been taking her  estrogens recently.    Past Medical History  Diagnosis Date  . Osteoarthritis of hip   . History of migraine headaches     Typical symptoms - bright lights, unilateral (starting behind her left ear), throbbing .  Marland Kitchen Surgical menopause 08/2007    On HRT. Occuring since 01/09 following TAH and R-salpingo-oophorectomy   . Tibial fracture 10/2008    Left - Sustained 2/2 fall down stairs. Patient is S/P closed internal fixation with Smith Nephew tibial nail locked proximal and distal  . Endometriosis     S/P TAH and salpingo-oophorectomy, right  . Chronic cough   . Gastritis     Thought due to chronic NSAID use 2/2 pain from her congenital hip abnormality  . Epigastric pain     Chronic, thought 2/2 gastritis, EGD (07/14/2011) showing chronic duodenitis, H.pyloir neg // Repeat EGD (09/2011) - esophagealring, sessible polyp in stomach body, hiatal hernia - rec ad probiotic, await bx  . 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 With chronic OA of hip  . Vitamin D deficiency 11/2009    Vit D level 15 in 11/2009  . Closed fibular fracture 10/2008    Left - Sustained 2/2 fall down stairs (same fall as tibular fracture). S/P internal fixation.  . Hidradenitis suppurativa 05/2006  . Protrusio acetabuli     diagnosed at age 61  . Ovarian cancer 1995  . Headache(784.0)     Migraines  . Duodenitis     EGD (07/14/2011) showing chronic duodenitis, H.pyloir neg  . Esophageal ring     s/p  dilatation during EGD (07/2011) - Dr. Darrick Penna  . Anxiety     pt denies having dx of anxiety  . Sessile colonic polyp     noted 09/2011 -- > rec colonoscopy in 10 years, 2023  . Internal hemorrhoids    Current Outpatient Prescriptions  Medication Sig Dispense Refill  . Linaclotide 290 MCG CAPS Take 1 capsule by mouth daily. Take 30 minutes before breakfast daily  30 capsule  3  . SUMAtriptan (IMITREX) 50 MG tablet Take 50 mg by mouth every 2  (two) hours as needed. For headache      . ergocalciferol (VITAMIN D2) 50000 UNITS capsule Take 1 capsule (50,000 Units total) by mouth once a week.  4 capsule  12  . lisinopril (PRINIVIL,ZESTRIL) 5 MG tablet Take 1 tablet (5 mg total) by mouth daily.  30 tablet  11   No current facility-administered medications for this visit.   Family History  Problem Relation Age of Onset  . Diabetes type II    . Arthritis    . Breast cancer Sister 37    remission  . Cancer Sister     breast  . Colon cancer Neg Hx   . Liver disease Neg Hx   . Diabetes Other   . Cancer Other     colon cancer  . Lupus Cousin    History   Social History  . Marital Status: Married    Spouse Name: N/A    Number of Children: 2  . Years of Education: bs degree   Occupational History  . Stay-at-home mom    Social History Main Topics  . Smoking status: Never Smoker   . Smokeless tobacco: None  . Alcohol Use: No  . Drug Use: No  . Sexual Activity: None   Other Topics Concern  . None   Social History Narrative   Insurance: Medicare.   Patient is married with two children, [place] and [place].          Review of Systems: Constitutional: Denies fever, chills, diaphoresis, appetite change and fatigue.  Respiratory: Denies SOB, DOE, cough, chest tightness, and wheezing.  Cardiovascular: No chest pain, palpitations and leg swelling.  Genitourinary: No dysuria, frequency, hematuria, or flank pain.   Objective:  Physical Exam: Filed Vitals:   03/28/13 1449  BP: 153/99  Pulse: 70  Temp: 97.4 F (36.3 C)  TempSrc: Oral  Height: 5' 11.5" (1.816 m)  Weight: 182 lb 8 oz (82.781 kg)  SpO2: 99%   General: Well nourished. No acute distress.  Lungs: CTA bilaterally. Heart: RRR; no extra sounds or murmurs  Abdomen: Non-distended, normal BS, soft; no hepatosplenomegaly. Generalized mild tenderness, no guarding or rebound tenderness.  Extremities: No pedal edema. No joint swelling or  tenderness. Neurologic: Alert and oriented x3. No obvious neurologic deficits.  Assessment & Plan:  I have discussed my assessment and plan  with Dr. Rogelia Boga as detailed under problem based charting.

## 2013-03-29 NOTE — Assessment & Plan Note (Signed)
Ordered a repeat anemia panel to rule iron deficiency. She reports that she continues to use Prilosec.

## 2013-03-29 NOTE — Assessment & Plan Note (Signed)
Patient reports that she has a gynecologist appointment in 2 days at [facility]. Due to this, I did not feel that we need to do any imaging from here since these might be ordered during her appointment by a gynecologist. I encourage her to followup with this appointment as her abdominal pain may be related to her postsurgical complications.

## 2013-03-31 ENCOUNTER — Ambulatory Visit (INDEPENDENT_AMBULATORY_CARE_PROVIDER_SITE_OTHER): Payer: Managed Care, Other (non HMO) | Admitting: Gastroenterology

## 2013-03-31 ENCOUNTER — Encounter: Payer: Self-pay | Admitting: Gastroenterology

## 2013-03-31 VITALS — BP 144/90 | HR 74 | Temp 97.4°F | Ht 69.0 in | Wt 182.0 lb

## 2013-03-31 DIAGNOSIS — K5900 Constipation, unspecified: Secondary | ICD-10-CM

## 2013-03-31 MED ORDER — LUBIPROSTONE 24 MCG PO CAPS: ORAL_CAPSULE | ORAL | Status: DC

## 2013-03-31 NOTE — Progress Notes (Signed)
No PCP on file 

## 2013-03-31 NOTE — Addendum Note (Signed)
Addended by: [place] on: 03/31/2013 11:10 AM   Modules accepted: Orders

## 2013-03-31 NOTE — Progress Notes (Signed)
Subjective:    Patient ID: [name], female    DOB: [birth year], 37 y.o.   MRN: [id number] No primary provider on file.  HPI Abdominal paIn. YESTERDAY HAD A BACK UP AND IN A LOT OF PAIN. FEELS A LITTLE BETTER TODAY. HAS TO GET A REFILL AND IT'S SO EXPENSIVE. TAKING IT EVERY DAY. HAS A CO-PAY CARD BUT STILL COST $200. NO WEIGHT LOSS. GAINED WEIGHT. AT BEGINNING TAKING LINZESS WAS HAVING DIARRHEA. ALL OF SUDDEN HER BODY GOT USED TO IT AND THEN STARTED GOING EVERY 2 DAYS. HAD A UTI 2 WEEKS AGO AND ON ABX. MAY HAVE A #1, #2, OR #6. OUT OF PROBIOTIC BUT PLANS TO GET SOME MORE. OFF FOR ABOUT A WEEK. HAS A MIGRAINE.  PT DENIES FEVER, CHILLS, BRBPR, nausea, vomiting, melena,problems swallowing, problems with sedation, OR heartburn or indigestion.  Past Medical History  Diagnosis Date  . Osteoarthritis of hip   . History of migraine headaches     Typical symptoms - bright lights, unilateral (starting behind her left ear), throbbing .  Marland Kitchen Surgical menopause 08/2007    On HRT. Occuring since 01/09 following TAH and R-salpingo-oophorectomy   . Tibial fracture 10/2008    Left - Sustained 2/2 fall down stairs. Patient is S/P closed internal fixation with Smith Nephew tibial nail locked proximal and distal  . Endometriosis     S/P TAH and salpingo-oophorectomy, right  . Chronic cough   . Gastritis     Thought due to chronic NSAID use 2/2 pain from her congenital hip abnormality  . Epigastric pain     Chronic, thought 2/2 gastritis, EGD (07/14/2011) showing chronic duodenitis, H.pyloir neg // Repeat EGD (09/2011) - esophagealring, sessible polyp in stomach body, hiatal hernia - rec ad probiotic, await bx  . 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 With chronic OA of hip  . Vitamin D deficiency 11/2009    Vit D level 15 in 11/2009  . Closed fibular fracture 10/2008    Left - Sustained 2/2 fall down stairs (same fall as tibular  fracture). S/P internal fixation.  . Hidradenitis suppurativa 05/2006  . Protrusio acetabuli     diagnosed at age 10  . Ovarian cancer 1995  . Headache(784.0)     Migraines  . Duodenitis     EGD (07/14/2011) showing chronic duodenitis, H.pyloir neg  . Esophageal ring     s/p dilatation during EGD (07/2011) - Dr. Darrick Penna  . Anxiety     pt denies having dx of anxiety  . Sessile colonic polyp     noted 09/2011 -- > rec colonoscopy in 10 years, 2023  . Internal hemorrhoids    Past Surgical History  Procedure Laterality Date  . Cesarean section  08/2005    Primary low transverse  . Tubal ligation  08/2005    Right tubal ligation  . Laparoscopy  10/2000    Operative laparoscopy with lysis of right adnexal adhesions and uterointestinal adhesions  . Other surgical history  02/2009    Closed treatment internal fixation, left tibia with Katrinka Blazing and Nephew tibial nail locked proximal and distal  . Total abdominal hysterectomy w/ bilateral salpingoophorectomy  08/2007    for endometriosis. With concurrent right salpingo-oophorectomy  (2009), left oophorectomy (1995)  . Left tib fib    . Joint replacement      right hip  . Fracture surgery      Right hip  . Colonoscopy  09/22/2011    [id number] polyp in the rectum/Internal hemorrhoids, benign path  . Esophagogastroduodenoscopy  09/22/2011    UJW:[id number], esophageal/Sessile polyp in the body of the stomach/Hiatal hernia/ABDOMINAL PAIN LIKELY FUNCTIONAL V. POST-INFECTIOUS IBS, path: mild chronic gastritis   Allergies  Allergen Reactions  . Pineapple Shortness Of Breath and Swelling    Has Epi pen  . Morphine And Related Itching  . Tape Swelling    Current Outpatient Prescriptions  Medication Sig Dispense Refill  . ergocalciferol (VITAMIN D2) 50000 UNITS capsule Take 1 capsule (50,000 Units total) by mouth once a week.  4 capsule  12  . Linaclotide 290 MCG CAPS Take 1 capsule by mouth daily. Take 30 minutes before breakfast daily    .  lisinopril (PRINIVIL,ZESTRIL) 5 MG tablet Take 1 tablet (5 mg total) by mouth daily.    . SUMAtriptan (IMITREX) 50 MG tablet Take 50 mg by mouth every 2 (two) hours as needed. For headache          Review of Systems     Objective:   Physical Exam  Vitals reviewed. Constitutional: She is oriented to person, place, and time. She appears well-nourished. No distress.  APPEARS SLIGHTLY SEDATED  HENT:  Head: Normocephalic and atraumatic.  Mouth/Throat: Oropharynx is clear and moist. No oropharyngeal exudate.  Eyes: Pupils are equal, round, and reactive to light. No scleral icterus.  Neck: Normal range of motion. Neck supple.  Cardiovascular: Normal rate, regular rhythm and normal heart sounds.   Pulmonary/Chest: Effort normal and breath sounds normal. No respiratory distress.  Abdominal: Soft. Bowel sounds are normal. She exhibits no distension. There is tenderness. There is no rebound and no guarding.  MILD TTP x4   Musculoskeletal: Normal range of motion. She exhibits no edema.  Lymphadenopathy:    She has no cervical adenopathy.  Neurological: She is alert and oriented to person, place, and time.  NO  NEW FOCAL DEFICITS   Psychiatric:  FLAT AFFECT, NL MOOD           Assessment & Plan:

## 2013-03-31 NOTE — Patient Instructions (Addendum)
DRINK WATER TO KEEP YOUR URINE LIGHT YELLOW.  TAKE A PROBIOTIC DAILY  ([employer] BRAND, PHILLIP'S COLON HEALTH, ALIGN).    FOR FLARES CHANGE TO A LACTOSE FREE/FULL LIQUID DIET FOR 3 DAYS.  SEE INFO BELOW. START HIGH FIBER DIET ON AUG 24  CHANGE TO AMITIZA 24 MCG BID.  FOLLOW UP IN 6-8 WEEKS.    Full Liquid Diet A high-calorie, high-protein supplement should be used to meet your nutritional requirements when the full liquid diet is continued for more than 2 or 3 days. If this diet is to be used for an extended period of time (more than 7 days), a multivitamin should be considered.  Breads and Starches  Allowed: None are allowed except crackersWHOLE OR pureed (made into a thick, smooth soup) in soup. Cooked, refined corn, oat, rice, rye, and wheat cereals are also allowed.   Avoid: Any others.    Potatoes/Pasta/Rice  Allowed: ANY ITEM AS A SOUP OR SMALL PLATE OF MASHED POTATOES OR RICE.       Vegetables  Allowed: Strained tomato or vegetable juice. Vegetables pureed in soup.   Avoid: Any others.    Fruit  Allowed: Any strained fruit juices and fruit drinks. Include 1 serving of citrus or vitamin C-enriched fruit juice daily.   Avoid: Any others.  Meat and Meat Substitutes  Allowed: Egg  Avoid: Any meat, fish, or fowl. All cheese.  Milk  Allowed: SOY Milk beverages, including milk shakes and instant breakfast mixes. Smooth yogurt.   Avoid: Any others. Avoid dairy products if not tolerated.    Soups and Combination Foods  Allowed: Broth, strained cream soups. Strained, broth-based soups.   Avoid: Any others.    Desserts and Sweets  Allowed: flavored gelatin, tapioca, plain LACTOSE FREE ice cream, sherbet, smooth pudding, junket, fruit ices, frozen ice pops, pudding pops,, frozen fudge pops, chocolate syrup. Sugar, honey, jelly, syrup.   Avoid: Any others.  Fats and Oils  Allowed: Margarine, butter, cream, sour cream, oils.   Avoid: Any others.   Beverages  Allowed: All.   Avoid: None.  Condiments  Allowed: Iodized salt, pepper, spices, flavorings. Cocoa powder.   Avoid: Any others.    SAMPLE MEAL PLAN Breakfast   cup orange juice.   1 cup cooked wheat cereal.   1 cup SOY milk.   1 cup beverage (coffee or tea).   Cream or sugar, if desired.    Midmorning Snack  2 SCRAMBLED OR HARD BOILED EGG   Lunch  1 cup cream soup.    cup fruit juice.   1 cup SOY milk.    cup custard.   1 cup beverage (coffee or tea).   Cream or sugar, if desired.    Midafternoon Snack  1 cup VANILLA SOY milk shake.  Dinner  1 cup cream soup.    cup fruit juice.   1 cup SOY MILK    cup pudding.   1 cup beverage (coffee or tea).   Cream or sugar, if desired.  Evening Snack  1 cup supplement.  To increase calories, add sugar, cream, butter, or margarine if possible. Nutritional supplements will also increase the total calories.   Irritable Bowel Syndrome Irritable Bowel Syndrome (IBS) is caused by a disturbance of normal bowel function. Other terms used are spastic colon, mucous colitis, and irritable colon. It does not require surgery, nor does it lead to cancer. There is no cure for IBS. But with proper diet, stress reduction, and medication, you will find that your  problems (symptoms) will gradually disappear or improve. IBS is a common digestive disorder. It usually appears in late adolescence or early adulthood. Women develop it twice as often as men.  CAUSES  After food has been digested and absorbed in the small intestine, waste material is moved into the colon (large intestine). In the colon, water and salts are absorbed from the undigested products coming from the small intestine. The remaining residue, or fecal material, is held for elimination. Under normal circumstances, gentle, rhythmic contractions on the bowel walls push the fecal material along the colon towards the rectum. In IBS, however, these  contractions are irregular and poorly coordinated. The fecal material is either retained too long, resulting in constipation, or expelled too soon, producing diarrhea.  SYMPTOMS  The most common symptom of IBS is pain. It is typically in the lower left side of the belly (abdomen). But it may occur anywhere in the abdomen. It can be felt as heartburn, backache, or even as a dull pain in the arms or shoulders. The pain comes from excessive bowel-muscle spasms and from the buildup of gas and fecal material in the colon. This pain:  Can range from sharp belly (abdominal) cramps to a dull, continuous ache.   Usually worsens soon after eating.   Is typically relieved by having a bowel movement or passing gas.  Abdominal pain is usually accompanied by constipation. But it may also produce diarrhea. The diarrhea typically occurs right after a meal or upon arising in the morning. The stools are typically soft and watery. They are often flecked with secretions (mucus). Other symptoms of IBS include:  Bloating.   Loss of appetite.   Heartburn.   Feeling sick to your stomach (nausea).   Belching   Vomiting   Gas.  IBS may also cause a number of symptoms that are unrelated to the digestive system:  Fatigue.   Headaches.   Anxiety   Shortness of breath   Difficulty in concentrating.   Dizziness.  These symptoms tend to come and go.  DIAGNOSIS  The symptoms of IBS closely mimic the symptoms of other, more serious digestive disorders. So your caregiver may wish to perform a variety of additional tests to exclude these disorders. He/she wants to be certain of learning what is wrong (diagnosis). The nature and purpose of each test will be explained to you.   HOME CARE INSTRUCTIONS   Avoid foods that are high in fat or oils. Some examples XWR:UEAVW cream, butter, frankfurters, sausage, and other fatty meats.   Avoid foods that have a laxative effect, such as fruit, fruit juice, and  dairy products.   Cut out carbonated drinks, chewing gum, and "gassy" foods, such as beans and cabbage. This may help relieve bloating and belching.   Bran taken with plenty of liquids may help relieve constipation.   Keep track of what foods seem to trigger your symptoms.   Avoid emotionally charged situations or circumstances that produce anxiety.   Start or continue exercising.   Get plenty of rest and sleep.

## 2013-03-31 NOTE — Progress Notes (Signed)
LMOM for pt to be sure and take the Amitiza 24 mcg once daily x 7 days at bedtime (with a little food) and then twice daily with food. I asked her to return my call and let me know that she got the message.

## 2013-03-31 NOTE — Addendum Note (Signed)
Addended by: [place] on: 03/31/2013 11:02 AM   Modules accepted: Orders, Medications

## 2013-03-31 NOTE — Progress Notes (Signed)
LMOM x 2 for pt to call in reference to her medication today.

## 2013-03-31 NOTE — Assessment & Plan Note (Addendum)
MOST LIKELY DUE TO IBS. CURRENTLY HAVING A FLARE/? DUE TO STRESS.  TAKE A PROBIOTIC DAILY  ([employer] BRAND, PHILLIP'S COLON HEALTH, ALIGN).  FOR FLARES CHANGE TO A LACTOSE FREE/FULL LIQUID DIET FOR 3 DAYS.  HO GIVEN. D/C LINZESS. CHANGE TO AMITIZA 24 MCG BID. DRINK WATER START HIGH FIBER DIET ON AUG 24 EXPLAINED THE DIAGNOSIS AND MANAGEMENT OF IBS. OPV IN 6-8 WEEKS.

## 2013-04-04 ENCOUNTER — Telehealth: Payer: Self-pay

## 2013-04-04 MED ORDER — LUBIPROSTONE 8 MCG PO CAPS: ORAL_CAPSULE | ORAL | Status: DC

## 2013-04-04 NOTE — Telephone Encounter (Signed)
PLEASE CALL PT. SHE SHOULD STOP TAKING AMITIZA 24 MCG BID. SHE MAY PICK UP SAMPLES OF AMITIZA 8 MCG AND START TAKING 1 PO QHS ON AUG 29. SHE SHOULD SEE HER PCP FOR BURING WITH URINATION, HAs, AND BACK PAIN. CAL 7 DAYS AFTER STARTING AMITIZA 8 MCG IF SHE IS STILL HAVING DIARRHEA.

## 2013-04-04 NOTE — Telephone Encounter (Signed)
Pt called and said she is not feeling well. She started taking the Amitiza 24 mcg qhs as advised. She has had 6 episodes of diarrhea since Sat. But she said it feels like the diarrhea is coming out in front with urine and it is burning. Her back is also hurting some and she is having headaches. She is not feeling very well at all. Please advise!

## 2013-04-05 ENCOUNTER — Encounter: Payer: Self-pay | Admitting: Gastroenterology

## 2013-04-05 NOTE — Progress Notes (Signed)
Pt is aware of OV on 10/8 at 9 with SF and appt card was mailed

## 2013-04-05 NOTE — Telephone Encounter (Signed)
Called and informed pt. Samples of Amitiza 8 mcg #16 at front with instructions .

## 2013-04-06 ENCOUNTER — Other Ambulatory Visit: Payer: Self-pay | Admitting: Gastroenterology

## 2013-04-06 NOTE — Telephone Encounter (Signed)
LMOM to call.

## 2013-04-06 NOTE — Telephone Encounter (Signed)
PLEASE CALL PT. SHE HAS IBS, WHICH CAUSES ABDOMINAL PAIN. IF SHE DOESN'T HAVE A BM SHE'S GOING TO HAVE ABDOMINAL PAIN. IF SHE WANTS LINZESS. I WILL BE MORE [place] HAPPY TO PRESCRIBE IT FOR HER.SHE TOLD ME SHE WANTED TO SWITCH BECAUSE LINZESS CAUSED DIARRHEA. CALL IN LINZESS 145 MCG TABLETS 30 MINS PRIOR TO BREAKFAST DAILY, #30, REFILL X11.  SHE SHOULD SEE A MENTAL HEALTH SPECIALIST TO HELP WITH STRESS WHICH CAN ALSO MAKE ABDOMINAL PAIN WORSE.

## 2013-04-06 NOTE — Telephone Encounter (Signed)
Pt came by the office and declined to pick up the Amitiza 8 mcg. She thinks that Amitiza is going against her and she prefers to go back to the Linzess. She said she went by the pharmacy and they said that she might need to get a PA for the Linzess.  She said she is very frustrated and feels she is being sent back and forth between physicians and nothing is really being done for her.   She is very concerned about weight gain and the pain above her belly button.   She has not had a BM since Mon, but is afraid to take the Amitiza.   Please advise!

## 2013-04-06 NOTE — Telephone Encounter (Signed)
Rx called to [place] [facility]

## 2013-04-07 NOTE — Telephone Encounter (Signed)
LMOM to call.

## 2013-04-12 ENCOUNTER — Ambulatory Visit (INDEPENDENT_AMBULATORY_CARE_PROVIDER_SITE_OTHER): Payer: Managed Care, Other (non HMO) | Admitting: *Deleted

## 2013-04-12 DIAGNOSIS — Z0000 Encounter for general adult medical examination without abnormal findings: Secondary | ICD-10-CM

## 2013-04-12 DIAGNOSIS — Z111 Encounter for screening for respiratory tuberculosis: Secondary | ICD-10-CM

## 2013-04-13 NOTE — Telephone Encounter (Signed)
LMOM for a return call. Letter mailed to pt also.

## 2013-04-14 LAB — TB SKIN TEST: Induration: 0 mm

## 2013-04-19 DIAGNOSIS — R232 Flushing: Secondary | ICD-10-CM | POA: Diagnosis not present

## 2013-04-19 DIAGNOSIS — Z01419 Encounter for gynecological examination (general) (routine) without abnormal findings: Secondary | ICD-10-CM | POA: Diagnosis not present

## 2013-05-18 ENCOUNTER — Telehealth: Payer: Self-pay | Admitting: Gastroenterology

## 2013-05-18 ENCOUNTER — Encounter (INDEPENDENT_AMBULATORY_CARE_PROVIDER_SITE_OTHER): Payer: Medicare Other | Admitting: Gastroenterology

## 2013-05-18 NOTE — Progress Notes (Deleted)
  Subjective:    Patient ID: [name], female    DOB: Apr 07, 1976, 37 y.o.   MRN: [id number] Aletta Edouard, MD  HPI    Review of Systems     Objective:   Physical Exam        Assessment & Plan:

## 2013-05-18 NOTE — Telephone Encounter (Signed)
REVIEWED.  

## 2013-05-18 NOTE — Telephone Encounter (Signed)
Pt was a no show

## 2013-05-19 ENCOUNTER — Encounter: Payer: Self-pay | Admitting: Internal Medicine

## 2013-05-19 ENCOUNTER — Ambulatory Visit (INDEPENDENT_AMBULATORY_CARE_PROVIDER_SITE_OTHER): Payer: Managed Care, Other (non HMO) | Admitting: Internal Medicine

## 2013-05-19 VITALS — BP 150/99 | HR 61 | Temp 98.0°F | Ht 71.0 in | Wt 188.1 lb

## 2013-05-19 DIAGNOSIS — R220 Localized swelling, mass and lump, head: Secondary | ICD-10-CM

## 2013-05-19 DIAGNOSIS — K2940 Chronic atrophic gastritis without bleeding: Secondary | ICD-10-CM

## 2013-05-19 DIAGNOSIS — G43909 Migraine, unspecified, not intractable, without status migrainosus: Secondary | ICD-10-CM

## 2013-05-19 DIAGNOSIS — I10 Essential (primary) hypertension: Secondary | ICD-10-CM

## 2013-05-19 DIAGNOSIS — R221 Localized swelling, mass and lump, neck: Secondary | ICD-10-CM

## 2013-05-19 DIAGNOSIS — R635 Abnormal weight gain: Secondary | ICD-10-CM

## 2013-05-19 DIAGNOSIS — K2950 Unspecified chronic gastritis without bleeding: Secondary | ICD-10-CM

## 2013-05-19 LAB — TSH: TSH: 1.363 u[IU]/mL (ref 0.350–4.500)

## 2013-05-19 MED ORDER — PANTOPRAZOLE SODIUM 20 MG PO TBEC: 20.0000 mg | DELAYED_RELEASE_TABLET | Freq: Every day | ORAL | Status: DC

## 2013-05-19 MED ORDER — LISINOPRIL 10 MG PO TABS: 10.0000 mg | ORAL_TABLET | Freq: Every day | ORAL | Status: DC

## 2013-05-19 MED ORDER — NORTRIPTYLINE HCL 10 MG PO CAPS: 20.0000 mg | ORAL_CAPSULE | Freq: Every day | ORAL | Status: DC

## 2013-05-19 NOTE — Assessment & Plan Note (Signed)
The patient notes symptoms of throat discomfort, occasional epigastric pain, and awakening with a sour taste in her mouth, with increased NSAID usage recently, in the setting of known hiatal hernia, likely representing GERD/gastritis. The patient is not taking any antacid medications. The patient notes concern about her thyroid, though her symptoms are somewhat inconsistent. -Start Protonix daily -Discontinue ibuprofen use -Check TSH

## 2013-05-19 NOTE — Assessment & Plan Note (Signed)
The patient describes her headaches as similar to her typical migraines, though they may have a component of medication withdrawal headaches as well. -Stop ibuprofen use -Start nortriptyline for migraine prevention -Continue Imitrex for abortive therapy. We discussed changing to ergotamine, given her symptoms of sedation with Imitrex, but patient prefers at this time to remain with Imitrex

## 2013-05-19 NOTE — Progress Notes (Signed)
error 

## 2013-05-19 NOTE — Patient Instructions (Signed)
General Instructions: I believe your neck swelling is due to a condition called GERD, also known as acid reflux.  This can be worsened by medications such as ibuprofen. -start taking protonix, 1 tablet daily -we are checking a thyroid level today -stop taking ibuprofen  Your headaches likely represent migraines.  As suggested by your Neurologist, we recommend a daily medication to prevent migraines. -to prevent further migraines, take Nortriptyline, 1 tablet every night for the first week, then 2 tablets every night after the first week -to treat your current migraines, take 1 imitrex at the first sign of migraine.  If you experience too much sedation, we can change to Ergotamine  Your neck pain likely represents a muscle strain -you may use heat or cold packs as tolerated -try neck stretching exercises below  Your blood pressure is elevated today.  We are increasing your dose of Lisinopril to 10 mg daily.  Please return for a follow-up visit in 2-4 weeks   Treatment Goals:  Goals (1 Years of Data) as of 05/19/13         As of Today 03/31/13 03/28/13 03/14/13 02/21/13     Blood Pressure    . Blood Pressure < 140/90  150/99 144/90 153/99 162/101 145/88      Progress Toward Treatment Goals:  Treatment Goal 05/19/2013  Blood pressure deteriorated    Self Care Goals & Plans:  Self Care Goal 05/19/2013  Manage my medications take my medicines as prescribed; refill my medications on time  Monitor my health keep track of my blood pressure; keep track of my weight  Eat healthy foods drink diet soda or water instead of juice or soda; eat more vegetables; eat foods that are low in salt; eat fruit for snacks and desserts; eat baked foods instead of fried foods  Be physically active take a walk every day    No flowsheet data found.   Care Management & Community Referrals:  Referral 05/19/2013  Referrals made for care management support none needed

## 2013-05-19 NOTE — Assessment & Plan Note (Signed)
BP Readings from Last 3 Encounters:  05/19/13 150/99  03/31/13 144/90  03/28/13 153/99    Lab Results  Component Value Date   NA 138 03/28/2013   K 3.6 03/28/2013   CREATININE 0.81 03/28/2013    Assessment: Blood pressure control: mildly elevated Progress toward BP goal:  deteriorated Comments: Blood pressure mildly elevated today  Plan: Medications:  Increase lisinopril from 5 mg to 10 mg Educational resources provided:   Self management tools provided:   Other plans: Check BMET at the next visit, given increased lisinopril.

## 2013-05-19 NOTE — Progress Notes (Signed)
HPI The patient is a 37 y.o. female with a history of hypertension, gastritis, anxiety, presenting for an acute visit for throat discomfort.  The patient notes a 1-week history of throat discomfort, described as a "fullness" felt in the back of her throat, without sore throat, dysphagia, odynophagia, fevers/chills, nausea/vomiting.  Only new medication is hormone replacement x1 month.  When lying down, she sometimes feels that food wants to "come back up".  Occasional acidic taste in the back of her mouth.  Occasional epigastric pain.  She has been taking ibuprofen daily for headache.  The patient has no FH of thyroid problems.  She notes hot flashes, which have improved with hormone pills.  She notes that her weight has increased (6 lbs in the last 2 months) without significant change in her diet.  The patient notes walking daily.  No change in appetite.  Mild skin dryness.  Patient has a history of headache.  The patient notes daily bilateral posterior headache, with associated photophobia and nausea, for 1 month.  She patient has been taking ibuprofen, which helps.  The patient states the headaches are similar to her typical migraine.  She has taken imitrex in the past, but hasn't been taking this due to side effects of sedation.  Neurology has recommended nortriptyline in the past, but patient hasn't taken this due to not wanting to take many daily medications.  ROS: General: no fevers, chills, changes in weight, changes in appetite Skin: no rash HEENT: no blurry vision, hearing changes, sore throat Pulm: no dyspnea, coughing, wheezing CV: no chest pain, palpitations, shortness of breath Abd: see HPI GU: no dysuria, hematuria, polyuria Ext: no arthralgias, myalgias Neuro: no weakness, numbness, or tingling  Filed Vitals:   05/19/13 0851  BP: 150/99  Pulse: 61  Temp: 98 F (36.7 C)    PEX General: alert, cooperative, and in no apparent distress HEENT: PERRL though discomfort provoked  by light, EOMI, oropharynx non-erythematous Neck: supple, no lymphadenopathy, JVD, or carotid bruits Lungs: clear to ascultation bilaterally, normal work of respiration, no wheezes, rales, ronchi Heart: regular rate and rhythm, no murmurs, gallops, or rubs Abdomen: soft, non-tender, non-distended, normal bowel sounds Extremities: no cyanosis, clubbing, or edema Neurologic: alert & oriented X3, cranial nerves II-XII intact, strength grossly intact, sensation intact to light touch  Current Outpatient Prescriptions on File Prior to Visit  Medication Sig Dispense Refill  . ergocalciferol (VITAMIN D2) 50000 UNITS capsule Take 1 capsule (50,000 Units total) by mouth once a week.  4 capsule  12  . LINZESS 290 MCG CAPS TAKE 1 CAPSULE BY MOUTH DAILY. TAKE 30 MINUTES BEFORE BREAKFAST DAILY  30 capsule  11  . lisinopril (PRINIVIL,ZESTRIL) 5 MG tablet Take 1 tablet (5 mg total) by mouth daily.  30 tablet  11  . lubiprostone (AMITIZA) 8 MCG capsule 1 PO QHS FOR 7 DAYS THEN 1 PO BID  62 capsule  11  . SUMAtriptan (IMITREX) 50 MG tablet Take 50 mg by mouth every 2 (two) hours as needed. For headache       No current facility-administered medications on file prior to visit.    Assessment/Plan

## 2013-05-22 NOTE — Progress Notes (Signed)
Case discussed with Dr. Brown at the time of the visit.  We reviewed the resident's history and exam and pertinent patient test results.  I agree with the assessment, diagnosis, and plan of care documented in the resident's note. 

## 2013-05-26 ENCOUNTER — Ambulatory Visit: Payer: Managed Care, Other (non HMO) | Admitting: Gastroenterology

## 2013-06-16 ENCOUNTER — Encounter (INDEPENDENT_AMBULATORY_CARE_PROVIDER_SITE_OTHER): Payer: Managed Care, Other (non HMO) | Admitting: Gastroenterology

## 2013-06-16 ENCOUNTER — Telehealth: Payer: Self-pay | Admitting: Gastroenterology

## 2013-06-16 NOTE — Telephone Encounter (Signed)
NO SHOW OCT & NOV 2014.

## 2013-06-16 NOTE — Telephone Encounter (Signed)
Pt was a no show

## 2013-06-16 NOTE — Telephone Encounter (Signed)
REVIEWED.  

## 2013-06-17 NOTE — Progress Notes (Signed)
ERROR

## 2013-07-11 DIAGNOSIS — D485 Neoplasm of uncertain behavior of skin: Secondary | ICD-10-CM | POA: Diagnosis not present

## 2013-07-15 ENCOUNTER — Emergency Department (INDEPENDENT_AMBULATORY_CARE_PROVIDER_SITE_OTHER)
Admission: EM | Admit: 2013-07-15 | Discharge: 2013-07-15 | Disposition: A | Discharge status: Home / Self Care | Payer: Managed Care, Other (non HMO) | Source: Home / Self Care

## 2013-07-15 ENCOUNTER — Telehealth: Payer: Self-pay | Admitting: Orthopedic Surgery

## 2013-07-15 ENCOUNTER — Emergency Department (INDEPENDENT_AMBULATORY_CARE_PROVIDER_SITE_OTHER): Payer: Managed Care, Other (non HMO)

## 2013-07-15 ENCOUNTER — Encounter (HOSPITAL_COMMUNITY): Payer: Self-pay | Admitting: Emergency Medicine

## 2013-07-15 DIAGNOSIS — S8010XA Contusion of unspecified lower leg, initial encounter: Secondary | ICD-10-CM

## 2013-07-15 DIAGNOSIS — S8990XA Unspecified injury of unspecified lower leg, initial encounter: Secondary | ICD-10-CM | POA: Diagnosis not present

## 2013-07-15 DIAGNOSIS — S8012XA Contusion of left lower leg, initial encounter: Secondary | ICD-10-CM

## 2013-07-15 MED ORDER — TRAMADOL HCL 50 MG PO TABS: 50.0000 mg | ORAL_TABLET | Freq: Four times a day (QID) | ORAL | Status: DC | PRN

## 2013-07-15 NOTE — Telephone Encounter (Signed)
Patient called this morning requesting to be seen today for a fall, her knee is hurting.  Told her Dr. Romeo Apple is in surgery today and suggest she go to the ER for  A complete work up

## 2013-07-15 NOTE — ED Provider Notes (Signed)
CSN: [id number]     Arrival date & time 07/15/13  4696 History   First MD Initiated Contact with Patient 07/15/13 1027     Chief Complaint  Patient presents with  . Fall   (Consider location/radiation/quality/duration/timing/severity/associated sxs/prior Treatment) HPI Comments: 37 year old female fell while ascending stairs 8 days ago. She did not fall down the stairs. She bumped her lower shin on one of the cement steps. She experienced pain at the site of a bump and that it swelled for a few days. Is also complaining of her hips "pulling". 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 Denies actual hip pain. She is complaining of persistent pain in the left lower anterior shin and ankle. She is ambulatory with weightbearing.   Past Medical History  Diagnosis Date  . Osteoarthritis of hip   . History of migraine headaches     Typical symptoms - bright lights, unilateral (starting behind her left ear), throbbing .  Marland Kitchen Surgical menopause 08/2007    On HRT. Occuring since 01/09 following TAH and R-salpingo-oophorectomy   . Tibial fracture 10/2008    Left - Sustained 2/2 fall down stairs. Patient is S/P closed internal fixation with Smith Nephew tibial nail locked proximal and distal  . Endometriosis     S/P TAH and salpingo-oophorectomy, right  . Chronic cough   . Gastritis     Thought due to chronic NSAID use 2/2 pain from her congenital hip abnormality  . Epigastric pain     Chronic, thought 2/2 gastritis, EGD (07/14/2011) showing chronic duodenitis, H.pyloir neg // Repeat EGD (09/2011) - esophagealring, sessible polyp in stomach body, hiatal hernia - rec ad probiotic, await bx  . 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 With chronic OA of hip  . Vitamin D deficiency 11/2009    Vit D level 15 in 11/2009  . Closed fibular fracture 10/2008    Left - Sustained 2/2 fall  down stairs (same fall as tibular fracture). S/P internal fixation.  . Hidradenitis suppurativa 05/2006  . Protrusio acetabuli     diagnosed at age 77  . Ovarian cancer 1995  . Headache(784.0)     Migraines  . Duodenitis     EGD (07/14/2011) showing chronic duodenitis, H.pyloir neg  . Esophageal ring     s/p dilatation during EGD (07/2011) - Dr. Darrick Penna  . Anxiety     pt denies having dx of anxiety  . Sessile colonic polyp     noted 09/2011 -- > rec colonoscopy in 10 years, 2023  . Internal hemorrhoids    Past Surgical History  Procedure Laterality Date  . Cesarean section  08/2005    Primary low transverse  . Tubal ligation  08/2005    Right tubal ligation  . Laparoscopy  10/2000    Operative laparoscopy with lysis of right adnexal adhesions and uterointestinal adhesions  . Other surgical history  02/2009    Closed treatment internal fixation, left tibia with Katrinka Blazing and Nephew tibial nail locked proximal and distal  . Total abdominal hysterectomy w/ bilateral salpingoophorectomy  08/2007    for endometriosis. With concurrent right salpingo-oophorectomy  (2009), left oophorectomy (1995)  . Left tib fib    . Joint replacement      right hip  . Fracture surgery      Right hip  . Colonoscopy  09/22/2011    EXB:MWUXLKG polyp in the rectum/Internal hemorrhoids, benign  path  . Esophagogastroduodenoscopy  09/22/2011    OZH:YQMV, esophageal/Sessile polyp in the body of the stomach/Hiatal hernia/ABDOMINAL PAIN LIKELY FUNCTIONAL V. POST-INFECTIOUS IBS, path: mild chronic gastritis   Family History  Problem Relation Age of Onset  . Diabetes type II    . Arthritis    . Breast cancer Sister 60    remission  . Cancer Sister     breast  . Colon cancer Neg Hx   . Liver disease Neg Hx   . Diabetes Other   . Cancer Other     colon cancer  . Lupus Cousin    History  Substance Use Topics  . Smoking status: Never Smoker   . Smokeless tobacco: Not on file  . Alcohol Use: No   OB  History   Grav Para Term Preterm Abortions TAB SAB Ect Mult Living                 Review of Systems  Constitutional: Negative for fever, chills and activity change.  HENT: Negative.   Respiratory: Negative.   Cardiovascular: Negative.   Musculoskeletal:       As per HPI  Skin: Negative for color change, pallor and rash.  Neurological: Negative.     Allergies  Pineapple; Morphine and related; and Tape  Home Medications   Current Outpatient Rx  Name  Route  Sig  Dispense  Refill  . ergocalciferol (VITAMIN D2) 50000 UNITS capsule   Oral   Take 1 capsule (50,000 Units total) by mouth once a week.   4 capsule   12   . lisinopril (PRINIVIL,ZESTRIL) 10 MG tablet   Oral   Take 1 tablet (10 mg total) by mouth daily.   30 tablet   11   . LINZESS 290 MCG CAPS      TAKE 1 CAPSULE BY MOUTH DAILY. TAKE 30 MINUTES BEFORE BREAKFAST DAILY   30 capsule   11   . lubiprostone (AMITIZA) 8 MCG capsule      1 PO QHS FOR 7 DAYS THEN 1 PO BID   62 capsule   11   . nortriptyline (PAMELOR) 10 MG capsule   Oral   Take 2 capsules (20 mg total) by mouth at bedtime. For the first week, only take 1 tablet at bedtime   60 capsule   1   . pantoprazole (PROTONIX) 20 MG tablet   Oral   Take 1 tablet (20 mg total) by mouth daily.   30 tablet   3   . SUMAtriptan (IMITREX) 50 MG tablet   Oral   Take 50 mg by mouth every 2 (two) hours as needed. For headache         . traMADol (ULTRAM) 50 MG tablet   Oral   Take 1 tablet (50 mg total) by mouth every 6 (six) hours as needed.   15 tablet   0    BP 148/96  Pulse 56  Temp(Src) 97.6 F (36.4 C) (Oral)  Resp 16  SpO2 100% Physical Exam  Nursing note and vitals reviewed. Constitutional: She is oriented to person, place, and time. She appears well-developed and well-nourished. No distress.  HENT:  Head: Normocephalic and atraumatic.  Eyes: EOM are normal. Pupils are equal, round, and reactive to light.  Neck: Normal range of  motion. Neck supple.  Pulmonary/Chest: Effort normal. No respiratory distress.  Musculoskeletal:  L lower extremity with no discoloration, ecchymosis, swelling, deformity. Tender along the anterior surface of shin from ankle superiorly approx  20 cm. Distal N/V, M/S intact. Pulse 2+  Neurological: She is alert and oriented to person, place, and time. No cranial nerve deficit.  Skin: Skin is warm and dry.  Psychiatric: She has a normal mood and affect.    ED Course  Procedures (including critical care time) Labs Review Labs Reviewed - No data to display Imaging Review Dg Tibia/fibula Left  07/15/2013   CLINICAL DATA:  Fall.  EXAM: LEFT TIBIA AND FIBULA - 2 VIEW  COMPARISON:  03/09/2009.  FINDINGS: Tibial intramedullary rod and screws are noted. No evidence acute fracture. Tibial rod is in good anatomic alignment and appears to be well-seated. Old distal tibial shaft fracture present. This appears stable.  IMPRESSION: No acute abnormality. Patient has had a prior open reduction internal fixation of the distal left tibial fracture. Callus is present in this region. This region appears stable from prior study from 2010.   Electronically Signed   By: Maisie Fus  Register   On: 07/15/2013 11:25      MDM   1. Contusion of leg, left, initial encounter      Ice locally Tramadol 50 mg #15 Cont ibuprofen F/U with PC as needed  Hayden Rasmussen, NP 07/15/13 1143

## 2013-07-15 NOTE — ED Notes (Signed)
Reports falling up steps last week. Having pain in left leg. States "pulling sensation in left hip".  Pt has tried ibuprofen with mild relief in symptoms.

## 2013-07-15 NOTE — ED Provider Notes (Signed)
Medical screening examination/treatment/procedure(s) were performed by resident physician or non-physician practitioner and as supervising physician I was immediately available for consultation/collaboration.   Keshayla Schrum DOUGLAS MD.   Olvin Rohr D Decorey Wahlert, MD 07/15/13 1713 

## 2013-07-25 DIAGNOSIS — D485 Neoplasm of uncertain behavior of skin: Secondary | ICD-10-CM | POA: Diagnosis not present

## 2013-07-25 DIAGNOSIS — D049 Carcinoma in situ of skin, unspecified: Secondary | ICD-10-CM | POA: Diagnosis not present

## 2013-07-28 ENCOUNTER — Ambulatory Visit (INDEPENDENT_AMBULATORY_CARE_PROVIDER_SITE_OTHER): Payer: Managed Care, Other (non HMO) | Admitting: Internal Medicine

## 2013-07-28 ENCOUNTER — Encounter: Payer: Self-pay | Admitting: Internal Medicine

## 2013-07-28 VITALS — BP 140/100 | HR 94 | Temp 98.1°F | Ht 71.0 in | Wt 187.5 lb

## 2013-07-28 DIAGNOSIS — I10 Essential (primary) hypertension: Secondary | ICD-10-CM

## 2013-07-28 DIAGNOSIS — M21619 Bunion of unspecified foot: Secondary | ICD-10-CM

## 2013-07-28 DIAGNOSIS — M21611 Bunion of right foot: Secondary | ICD-10-CM | POA: Insufficient documentation

## 2013-07-28 NOTE — Progress Notes (Signed)
Patient ID: [name], female   DOB: 1975/09/09, 37 y.o.   MRN: [id number]    Patient: [name]   MRN: [id number]  DOB: 1976-07-01  PCP: Aletta Edouard, MD   Subjective:    CC: Follow-up   HPI: Ms. [name] is a 37 y.o. female with a PMHx as outlined below, who presented to clinic today for the following:  # Right foot surgery   She has been followed by [facility] for right foot bunion/spur and is scheduled to have surgery on 08/05/13. She is here for routine office visit for me to fill out the physical exam form.  # Hypertension    Patient is noted to be hypertensive in the past and she is supposed to start the Lisinopril 5 mg daily which was increased to 10 mg daily in Oct 2014. However, she does not think that she has hypertension and is resistant to the antihypertensive therapy. She states that she has not been taking any medication at all.   # Medical noncompliance    She is noted tto be on Vitamin D, Linzess ( prescribed by GI Dr. Michele Mcalpine), Amitiza, lisinopril, Nortriptyline and protonix, Imitrex and Tramadol. However, she states that she only takes Linzess and does not want to take the rest of medications.   Review of Systems: Review of Systems:  Constitutional:  Denies fever, chills, diaphoresis, appetite change and fatigue.   HEENT:  Denies congestion, sore throat, rhinorrhea, sneezing, mouth sores, trouble swallowing, neck pain   Respiratory:  Denies SOB, DOE, cough, and wheezing.   Cardiovascular:  Denies palpitations and leg swelling.   Gastrointestinal:  Denies nausea, vomiting, abdominal pain, diarrhea, constipation, blood in stool and abdominal distention.   Genitourinary:  Denies dysuria, urgency, frequency, hematuria, flank pain and difficulty urinating.   Musculoskeletal:  Denies myalgias, back pain, joint swelling, arthralgias and gait problem. Chronic left hip arthritic pain, right foot pain  Skin:  Denies pallor, rash and wound.     Neurological:  Denies dizziness, seizures, syncope, weakness, light-headedness, numbness and positive for chronic mild headaches.    .     Current Outpatient Medications: Current Outpatient Prescriptions  Medication Sig Dispense Refill  . LINZESS 290 MCG CAPS TAKE 1 CAPSULE BY MOUTH DAILY. TAKE 30 MINUTES BEFORE BREAKFAST DAILY  30 capsule  11  . ergocalciferol (VITAMIN D2) 50000 UNITS capsule Take 1 capsule (50,000 Units total) by mouth once a week.  4 capsule  12  . lisinopril (PRINIVIL,ZESTRIL) 10 MG tablet Take 1 tablet (10 mg total) by mouth daily.  30 tablet  11  . lubiprostone (AMITIZA) 8 MCG capsule 1 PO QHS FOR 7 DAYS THEN 1 PO BID  62 capsule  11  . nortriptyline (PAMELOR) 10 MG capsule Take 2 capsules (20 mg total) by mouth at bedtime. For the first week, only take 1 tablet at bedtime  60 capsule  1  . pantoprazole (PROTONIX) 20 MG tablet Take 1 tablet (20 mg total) by mouth daily.  30 tablet  3   No current facility-administered medications for this visit.    Allergies: Allergies  Allergen Reactions  . Pineapple Shortness Of Breath and Swelling    Has Epi pen  . Morphine And Related Itching  . Tape Swelling    Past Medical History  Diagnosis Date  . Osteoarthritis of hip   . History of migraine headaches     Typical symptoms - bright lights, unilateral (starting behind her left ear), throbbing .  Marland Kitchen  Surgical menopause 08/2007    On HRT. Occuring since 01/09 following TAH and R-salpingo-oophorectomy   . Tibial fracture 10/2008    Left - Sustained 2/2 fall down stairs. Patient is S/P closed internal fixation with Smith Nephew tibial nail locked proximal and distal  . Endometriosis     S/P TAH and salpingo-oophorectomy, right  . Chronic cough   . Gastritis     Thought due to chronic NSAID use 2/2 pain from her congenital hip abnormality  . Epigastric pain     Chronic, thought 2/2 gastritis, EGD (07/14/2011) showing chronic duodenitis, H.pyloir neg // Repeat EGD  (09/2011) - esophagealring, sessible polyp in stomach body, hiatal hernia - rec ad probiotic, await bx  . 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 With chronic OA of hip  . Vitamin D deficiency 11/2009    Vit D level 15 in 11/2009  . Closed fibular fracture 10/2008    Left - Sustained 2/2 fall down stairs (same fall as tibular fracture). S/P internal fixation.  . Hidradenitis suppurativa 05/2006  . Protrusio acetabuli     diagnosed at age 45  . Ovarian cancer 1995  . Headache(784.0)     Migraines  . Duodenitis     EGD (07/14/2011) showing chronic duodenitis, H.pyloir neg  . Esophageal ring     s/p dilatation during EGD (07/2011) - Dr. Darrick Penna  . Anxiety     pt denies having dx of anxiety  . Sessile colonic polyp     noted 09/2011 -- > rec colonoscopy in 10 years, 2023  . Internal hemorrhoids   . Hypertension   . Bunion, right foot     Objective:    Physical Exam: Filed Vitals:   07/28/13 0954 07/28/13 1245  BP: 156/99 140/100  Pulse: 94   Temp: 98.1 F (36.7 C)   TempSrc: Oral   Height: 5\' 11"  (1.803 m)   Weight: 187 lb 8 oz (85.049 kg)   SpO2: 99%     General: alert, well-developed, and cooperative to examination.  Head: normocephalic and atraumatic.  Eyes: vision grossly intact, pupils equal, pupils round, pupils reactive to light, no injection and anicteric.  Mouth: pharynx pink and moist, no erythema, and no exudates.  Neck: supple, full ROM, no thyromegaly, no JVD, and no carotid bruits.  Lungs: normal respiratory effort, no accessory muscle use, normal breath sounds, no crackles, and no wheezes. Heart: normal rate, regular rhythm, no murmur, no gallop, and no rub.  Abdomen: soft, non-tender, normal bowel sounds, no distention, no guarding, no rebound tenderness, no hepatomegaly, and no splenomegaly.  Msk: no joint swelling, no joint warmth, and no redness over joints.  Pulses: 2+ DP/PT pulses  bilaterally Extremities: No cyanosis, clubbing, edema. Right foot examined. Bunion growth noted on 1st MTP joint.  Neurologic: alert & oriented X3, cranial nerves II-XII intact, strength normal in all extremities, sensation intact to light touch, and gait normal.  Skin: turgor normal and no rashes.  Psych: Oriented X3, memory intact for recent and remote, normally interactive, good eye contact, not anxious appearing, and not depressed appearing.

## 2013-07-28 NOTE — Assessment & Plan Note (Addendum)
BP Readings from Last 3 Encounters:  07/28/13 156/99  07/15/13 148/96  05/19/13 150/99    Lab Results  Component Value Date   [name] 138 03/28/2013   K 3.6 03/28/2013   CREATININE 0.81 03/28/2013    Assessment: Blood pressure control: moderately elevated Progress toward BP goal:  unchanged  Plan: Medications:  continue current medications Educational resources provided:   Self management tools provided:   Other plans:   1. Patient does not agree with the diagnosis and is very resistant to the antihypertensive therapy.  2. I discussed with possible complications including stroke, CAD, death of untreated hypertension.  3. Low salt diet and exercise educations given to patient 4. Patient still does not want to take any medications. She would like to try the lifestyle changes first. I instructed her that life style change itself may help the BP control,but with her moderately elevated BP, it is very unlikely for her to achieve optimal BP control without medical therapy.

## 2013-07-28 NOTE — Progress Notes (Signed)
Case discussed with Dr. Li at the time of the visit.  We reviewed the resident's history and exam and pertinent patient test results.  I agree with the assessment, diagnosis, and plan of care documented in the resident's note. 

## 2013-07-28 NOTE — Assessment & Plan Note (Signed)
Assessment: Patient has had bunion on right 1st MTP joint for months and she is scheduled to have foot surgery next week.  Plan: - Continue to follow up with your surgeon.

## 2013-07-28 NOTE — Patient Instructions (Signed)
1. Please check your BP daily and bring the readings to the clinic. 2. Low salt diet and exercise and weight loss 3. Follow up in 1-2 months.   2 Gram Low Sodium Diet A 2 gram sodium diet restricts the amount of sodium in the diet to no more than 2 g or 2000 mg daily. Limiting the amount of sodium is often used to help lower blood pressure. It is important if you have heart, liver, or kidney problems. Many foods contain sodium for flavor and sometimes as a preservative. When the amount of sodium in a diet needs to be low, it is important to know what to look for when choosing foods and drinks. The following includes some information and guidelines to help make it easier for you to adapt to a low sodium diet. QUICK TIPS  Do not add salt to food.  Avoid convenience items and fast food.  Choose unsalted snack foods.  Buy lower sodium products, often labeled as "lower sodium" or "no salt added."  Check food labels to learn how much sodium is in 1 serving.  When eating at a restaurant, ask that your food be prepared with less salt or none, if possible. READING FOOD LABELS FOR SODIUM INFORMATION The nutrition facts label is a good place to find how much sodium is in foods. Look for products with no more than 500 to 600 mg of sodium per meal and no more than 150 mg per serving. Remember that 2 g = 2000 mg. The food label may also list foods as:  Sodium-free: Less than 5 mg in a serving.  Very low sodium: 35 mg or less in a serving.  Low-sodium: 140 mg or less in a serving.  Light in sodium: 50% less sodium in a serving. For example, if a food that usually has 300 mg of sodium is changed to become light in sodium, it will have 150 mg of sodium.  Reduced sodium: 25% less sodium in a serving. For example, if a food that usually has 400 mg of sodium is changed to reduced sodium, it will have 300 mg of sodium. CHOOSING FOODS Grains  Avoid: Salted crackers and snack items. Some cereals,  including instant hot cereals. Bread stuffing and biscuit mixes. Seasoned rice or pasta mixes.  Choose: Unsalted snack items. Low-sodium cereals, oats, puffed wheat and rice, shredded wheat. English muffins and bread. Pasta. Meats  Avoid: Salted, canned, smoked, spiced, pickled meats, including fish and poultry. Bacon, ham, sausage, cold cuts, hot dogs, anchovies.  Choose: Low-sodium canned tuna and salmon. Fresh or frozen meat, poultry, and fish. Dairy  Avoid: Processed cheese and spreads. Cottage cheese. Buttermilk and condensed milk. Regular cheese.  Choose: Milk. Low-sodium cottage cheese. Yogurt. Sour cream. Low-sodium cheese. Fruits and Vegetables  Avoid: Regular canned vegetables. Regular canned tomato sauce and paste. Frozen vegetables in sauces. Olives. Rosita Fire. Relishes. Sauerkraut.  Choose: Low-sodium canned vegetables. Low-sodium tomato sauce and paste. Frozen or fresh vegetables. Fresh and frozen fruit. Condiments  Avoid: Canned and packaged gravies. Worcestershire sauce. Tartar sauce. Barbecue sauce. Soy sauce. Steak sauce. Ketchup. Onion, garlic, and table salt. Meat flavorings and tenderizers.  Choose: Fresh and dried herbs and spices. Low-sodium varieties of mustard and ketchup. Lemon juice. Tabasco sauce. Horseradish. SAMPLE 2 GRAM SODIUM MEAL PLAN Breakfast / Sodium (mg)  1 cup low-fat milk / 143 mg  2 slices whole-wheat toast / 270 mg  1 tbs heart-healthy margarine / 153 mg  1 hard-boiled egg / 139 mg  1 small orange / 0 mg Lunch / Sodium (mg)  1 cup raw carrots / 76 mg   cup hummus / 298 mg  1 cup low-fat milk / 143 mg   cup red grapes / 2 mg  1 whole-wheat pita bread / 356 mg Dinner / Sodium (mg)  1 cup whole-wheat pasta / 2 mg  1 cup low-sodium tomato sauce / 73 mg  3 oz lean ground beef / 57 mg  1 small side salad (1 cup raw spinach leaves,  cup cucumber,  cup yellow bell pepper) with 1 tsp olive oil and 1 tsp red wine vinegar / 25  mg Snack / Sodium (mg)  1 container low-fat vanilla yogurt / 107 mg  3 graham cracker squares / 127 mg Nutrient Analysis  Calories: 2033  Protein: 77 g  Carbohydrate: 282 g  Fat: 72 g  Sodium: 1971 mg Document Released: 07/28/2005 Document Revised: 10/20/2011 Document Reviewed: 10/29/2009 [employer] Patient Information 2014 [place], [place].  Exercise to Lose Weight Exercise and a healthy diet may help you lose weight. Your doctor may suggest specific exercises. EXERCISE IDEAS AND TIPS  Choose low-cost things you enjoy doing, such as walking, bicycling, or exercising to workout videos.  Take stairs instead of the elevator.  Walk during your lunch break.  Park your car further away from work or school.  Go to a gym or an exercise class.  Start with 5 to 10 minutes of exercise each day. Build up to 30 minutes of exercise 4 to 6 days a week.  Wear shoes with good support and comfortable clothes.  Stretch before and after working out.  Work out until you breathe harder and your heart beats faster.  Drink extra water when you exercise.  Do not do so much that you hurt yourself, feel dizzy, or get very short of breath. Exercises that burn about 150 calories:  Running 1  miles in 15 minutes.  Playing volleyball for 45 to 60 minutes.  Washing and waxing a car for 45 to 60 minutes.  Playing touch football for 45 minutes.  Walking 1  miles in 35 minutes.  Pushing a stroller 1  miles in 30 minutes.  Playing basketball for 30 minutes.  Raking leaves for 30 minutes.  Bicycling 5 miles in 30 minutes.  Walking 2 miles in 30 minutes.  Dancing for 30 minutes.  Shoveling snow for 15 minutes.  Swimming laps for 20 minutes.  Walking up stairs for 15 minutes.  Bicycling 4 miles in 15 minutes.  Gardening for 30 to 45 minutes.  Jumping rope for 15 minutes.  Washing windows or floors for 45 to 60 minutes. Document Released: 08/30/2010 Document Revised:  10/20/2011 Document Reviewed: 08/30/2010 [facility] Patient Information 2014 [place], [place].

## 2013-08-03 DIAGNOSIS — Z471 Aftercare following joint replacement surgery: Secondary | ICD-10-CM | POA: Diagnosis not present

## 2013-08-03 DIAGNOSIS — Z96649 Presence of unspecified artificial hip joint: Secondary | ICD-10-CM | POA: Diagnosis not present

## 2013-08-03 DIAGNOSIS — S335XXA Sprain of ligaments of lumbar spine, initial encounter: Secondary | ICD-10-CM | POA: Diagnosis not present

## 2013-08-17 DIAGNOSIS — D0470 Carcinoma in situ of skin of unspecified lower limb, including hip: Secondary | ICD-10-CM | POA: Diagnosis not present

## 2013-09-09 ENCOUNTER — Encounter (HOSPITAL_COMMUNITY): Payer: Self-pay | Admitting: Emergency Medicine

## 2013-09-09 ENCOUNTER — Emergency Department (INDEPENDENT_AMBULATORY_CARE_PROVIDER_SITE_OTHER)
Admission: EM | Admit: 2013-09-09 | Discharge: 2013-09-09 | Disposition: A | Discharge status: Home / Self Care | Payer: BC Managed Care – PPO | Source: Home / Self Care

## 2013-09-09 DIAGNOSIS — J039 Acute tonsillitis, unspecified: Secondary | ICD-10-CM

## 2013-09-09 DIAGNOSIS — J329 Chronic sinusitis, unspecified: Secondary | ICD-10-CM | POA: Diagnosis not present

## 2013-09-09 MED ORDER — AMOXICILLIN-POT CLAVULANATE 875-125 MG PO TABS: 1.0000 | ORAL_TABLET | Freq: Two times a day (BID) | ORAL | Status: DC

## 2013-09-09 MED ORDER — PSEUDOEPHEDRINE HCL 30 MG PO TABS: 30.0000 mg | ORAL_TABLET | Freq: Four times a day (QID) | ORAL | Status: DC | PRN

## 2013-09-09 NOTE — Discharge Instructions (Signed)
Take the medications as directed and follow up with your doctor or return here for worsening symptoms.

## 2013-09-09 NOTE — ED Notes (Signed)
C/o sinus pressure and pain.  Bilateral ear pain.  Swollen/sore glands.   Mild nausea and dizziness.  No relief with otc meds.  Symptoms present x 2 wks.

## 2013-09-09 NOTE — ED Provider Notes (Signed)
CSN: [id number]     Arrival date & time 09/09/13  9924 History   None    Chief Complaint  Patient presents with  . URI   (Consider location/radiation/quality/duration/timing/severity/associated sxs/prior Treatment) Patient is a 38 y.o. female presenting with URI. The history is provided by the patient.  URI Presenting symptoms: congestion, ear pain, facial pain, rhinorrhea and sore throat   Presenting symptoms: no cough and no fever   Severity:  Moderate Onset quality:  Gradual Duration:  2 weeks Timing:  Constant Progression:  Worsening Chronicity:  New Relieved by:  Nothing Worsened by:  Nothing tried Ineffective treatments:  OTC medications Associated symptoms: myalgias, sinus pain, sneezing and swollen glands   Associated symptoms: no headaches     Past Medical History  Diagnosis Date  . Osteoarthritis of hip   . History of migraine headaches     Typical symptoms - bright lights, unilateral (starting behind her left ear), throbbing .  Marland Kitchen Surgical menopause 08/2007    On HRT. Occuring since 01/09 following TAH and R-salpingo-oophorectomy   . Tibial fracture 10/2008    Left - Sustained 2/2 fall down stairs. Patient is S/P closed internal fixation with Smith Nephew tibial nail locked proximal and distal  . Endometriosis     S/P TAH and salpingo-oophorectomy, right  . Chronic cough   . Gastritis     Thought due to chronic NSAID use 2/2 pain from her congenital hip abnormality  . Epigastric pain     Chronic, thought 2/2 gastritis, EGD (07/14/2011) showing chronic duodenitis, H.pyloir neg // Repeat EGD (09/2011) - esophagealring, sessible polyp in stomach body, hiatal hernia - rec ad probiotic, await bx  . 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 With chronic OA of hip  . Vitamin D deficiency 11/2009    Vit D level 15 in 11/2009  . Closed fibular fracture 10/2008    Left - Sustained 2/2 fall down stairs  (same fall as tibular fracture). S/P internal fixation.  . Hidradenitis suppurativa 05/2006  . Protrusio acetabuli     diagnosed at age 40  . Ovarian cancer 1995  . Headache(784.0)     Migraines  . Duodenitis     EGD (07/14/2011) showing chronic duodenitis, H.pyloir neg  . Esophageal ring     s/p dilatation during EGD (07/2011) - Dr. Oneida Alar  . Anxiety     pt denies having dx of anxiety  . Sessile colonic polyp     noted 09/2011 -- > rec colonoscopy in 10 years, 2023  . Internal hemorrhoids   . Hypertension   . Bunion, right foot    Past Surgical History  Procedure Laterality Date  . Cesarean section  08/2005    Primary low transverse  . Tubal ligation  08/2005    Right tubal ligation  . Laparoscopy  10/2000    Operative laparoscopy with lysis of right adnexal adhesions and uterointestinal adhesions  . Other surgical history  02/2009    Closed treatment internal fixation, left tibia with Tamala Julian and Nephew tibial nail locked proximal and distal  . Total abdominal hysterectomy w/ bilateral salpingoophorectomy  08/2007    for endometriosis. With concurrent right salpingo-oophorectomy  (2009), left oophorectomy (1995)  . Left tib fib    . Joint replacement      right hip  . Fracture surgery      Right hip  . Colonoscopy  09/22/2011    QAS:TMHDQQI polyp in the  rectum/Internal hemorrhoids, benign path  . Esophagogastroduodenoscopy  09/22/2011    XNA:TFTD, esophageal/Sessile polyp in the body of the stomach/Hiatal hernia/ABDOMINAL PAIN LIKELY FUNCTIONAL V. POST-INFECTIOUS IBS, path: mild chronic gastritis   Family History  Problem Relation Age of Onset  . Other Father     deceased while in service.   . Breast cancer Sister 68    remission  . Cancer Sister     breast  . Colon cancer Neg Hx   . Liver disease Neg Hx   . Lupus Cousin    History  Substance Use Topics  . Smoking status: Never Smoker   . Smokeless tobacco: Not on file  . Alcohol Use: No   OB History   Grav  Para Term Preterm Abortions TAB SAB Ect Mult Living                 Review of Systems  Constitutional: Negative for fever.  HENT: Positive for congestion, ear pain, rhinorrhea, sneezing and sore throat.   Eyes: Negative for redness, itching and visual disturbance.  Respiratory: Negative for cough.   Gastrointestinal: Positive for nausea. Negative for vomiting and abdominal pain.  Genitourinary: Negative for dysuria, frequency and decreased urine volume.  Musculoskeletal: Positive for myalgias.  Skin: Negative for rash.  Neurological: Positive for dizziness (with ear fullness). Negative for headaches.  Psychiatric/Behavioral: The patient is not nervous/anxious.     Allergies  Pineapple; Morphine and related; and Tape  Home Medications   Current Outpatient Rx  Name  Route  Sig  Dispense  Refill  . LINZESS 290 MCG CAPS      TAKE 1 CAPSULE BY MOUTH DAILY. TAKE 30 MINUTES BEFORE BREAKFAST DAILY   30 capsule   11   . ergocalciferol (VITAMIN D2) 50000 UNITS capsule   Oral   Take 1 capsule (50,000 Units total) by mouth once a week.   4 capsule   12   . lisinopril (PRINIVIL,ZESTRIL) 10 MG tablet   Oral   Take 1 tablet (10 mg total) by mouth daily.   30 tablet   11   . lubiprostone (AMITIZA) 8 MCG capsule      1 PO QHS FOR 7 DAYS THEN 1 PO BID   62 capsule   11   . nortriptyline (PAMELOR) 10 MG capsule   Oral   Take 2 capsules (20 mg total) by mouth at bedtime. For the first week, only take 1 tablet at bedtime   60 capsule   1   . pantoprazole (PROTONIX) 20 MG tablet   Oral   Take 1 tablet (20 mg total) by mouth daily.   30 tablet   3    BP 134/94  Pulse 64  Temp(Src) 98.3 F (36.8 C) (Oral)  Resp 18  SpO2 100% Physical Exam  Nursing note and vitals reviewed. Constitutional: She is oriented to person, place, and time. She appears well-developed and well-nourished.  HENT:  Head: Normocephalic.  Right Ear: Tympanic membrane is erythematous.  Left Ear:  Tympanic membrane normal.  Nose: Right sinus exhibits maxillary sinus tenderness. Left sinus exhibits maxillary sinus tenderness.  Mouth/Throat: Uvula is midline and mucous membranes are normal. Oropharyngeal exudate and posterior oropharyngeal erythema present.    Eyes: EOM are normal.  Neck: Neck supple.  Cardiovascular: Normal rate and regular rhythm.   Pulmonary/Chest: Effort normal and breath sounds normal.  Abdominal: Soft. There is no tenderness.  Musculoskeletal: Normal range of motion.  Lymphadenopathy:    She has cervical adenopathy.  Neurological: She is alert and oriented to person, place, and time. No cranial nerve deficit.  Skin: Skin is warm and dry.  Psychiatric: She has a normal mood and affect. Her behavior is normal.    ED Course  Procedures    MDM  38 y.o. female with exudative pharyngitis and sinusitis. Will treat with antibiotics and decongestants. Discussed with the patient clinical findings and plan of care. All questioned fully answered. She will follow up with her PCP or return here for worsening symptoms. Stable for discharge without further screening at this time. No signs of tonsillar abscess at this time, no difficulty swallowing or change in voice.     Medication List    TAKE these medications       amoxicillin-clavulanate 875-125 MG per tablet  Commonly known as:  AUGMENTIN  Take 1 tablet by mouth 2 (two) times daily.     pseudoephedrine 30 MG tablet  Commonly known as:  SUDAFED  Take 1 tablet (30 mg total) by mouth every 6 (six) hours as needed for congestion.      ASK your doctor about these medications       ergocalciferol 50000 UNITS capsule  Commonly known as:  VITAMIN D2  Take 1 capsule (50,000 Units total) by mouth once a week.     LINZESS 290 MCG Caps capsule  Generic drug:  Linaclotide  TAKE 1 CAPSULE BY MOUTH DAILY. TAKE 30 MINUTES BEFORE BREAKFAST DAILY     lisinopril 10 MG tablet  Commonly known as:  PRINIVIL,ZESTRIL  Take  1 tablet (10 mg total) by mouth daily.     lubiprostone 8 MCG capsule  Commonly known as:  AMITIZA  1 PO QHS FOR 7 DAYS THEN 1 PO BID     nortriptyline 10 MG capsule  Commonly known as:  PAMELOR  Take 2 capsules (20 mg total) by mouth at bedtime. For the first week, only take 1 tablet at bedtime     pantoprazole 20 MG tablet  Commonly known as:  PROTONIX  Take 1 tablet (20 mg total) by mouth daily.            [place], [place] 09/09/13 [phone]

## 2013-09-09 NOTE — ED Provider Notes (Signed)
Medical screening examination/treatment/procedure(s) were performed by resident physician or non-physician practitioner and as supervising physician I was immediately available for consultation/collaboration.   Pauline Good MD.   Billy Fischer, MD 09/09/13 [phone]

## 2013-09-21 ENCOUNTER — Ambulatory Visit: Payer: Medicare Other | Admitting: Internal Medicine

## 2013-09-21 ENCOUNTER — Encounter (HOSPITAL_COMMUNITY): Payer: Self-pay | Admitting: Emergency Medicine

## 2013-09-21 ENCOUNTER — Emergency Department (INDEPENDENT_AMBULATORY_CARE_PROVIDER_SITE_OTHER)
Admission: EM | Admit: 2013-09-21 | Discharge: 2013-09-21 | Disposition: A | Discharge status: Home / Self Care | Payer: BC Managed Care – PPO | Source: Home / Self Care | Attending: Family Medicine | Admitting: Family Medicine

## 2013-09-21 DIAGNOSIS — J329 Chronic sinusitis, unspecified: Secondary | ICD-10-CM

## 2013-09-21 MED ORDER — PREDNISONE 10 MG PO TABS: 30.0000 mg | ORAL_TABLET | Freq: Every day | ORAL | Status: DC

## 2013-09-21 MED ORDER — CLINDAMYCIN HCL 300 MG PO CAPS: 300.0000 mg | ORAL_CAPSULE | Freq: Three times a day (TID) | ORAL | Status: DC

## 2013-09-21 NOTE — Discharge Instructions (Signed)
Thank you for coming in today. Take prednisone and clinadmycin.  Follow up at [facility] if not better.  Call or go to the emergency room if you get worse, have trouble breathing, have chest pains, or palpitations.   Sinusitis Sinusitis is redness, soreness, and swelling (inflammation) of the paranasal sinuses. Paranasal sinuses are air pockets within the bones of your face (beneath the eyes, the middle of the forehead, or above the eyes). In healthy paranasal sinuses, mucus is able to drain out, and air is able to circulate through them by way of your nose. However, when your paranasal sinuses are inflamed, mucus and air can become trapped. This can allow bacteria and other germs to grow and cause infection. Sinusitis can develop quickly and last only a short time (acute) or continue over a long period (chronic). Sinusitis that lasts for more than 12 weeks is considered chronic.  CAUSES  Causes of sinusitis include:  Allergies.  Structural abnormalities, such as displacement of the cartilage that separates your nostrils (deviated septum), which can decrease the air flow through your nose and sinuses and affect sinus drainage.  Functional abnormalities, such as when the small hairs (cilia) that line your sinuses and help remove mucus do not work properly or are not present. SYMPTOMS  Symptoms of acute and chronic sinusitis are the same. The primary symptoms are pain and pressure around the affected sinuses. Other symptoms include:  Upper toothache.  Earache.  Headache.  Bad breath.  Decreased sense of smell and taste.  A cough, which worsens when you are lying flat.  Fatigue.  Fever.  Thick drainage from your nose, which often is green and may contain pus (purulent).  Swelling and warmth over the affected sinuses. DIAGNOSIS  Your caregiver will perform a physical exam. During the exam, your caregiver may:  Look in your nose for signs of abnormal growths in your nostrils  (nasal polyps).  Tap over the affected sinus to check for signs of infection.  View the inside of your sinuses (endoscopy) with a special imaging device with a light attached (endoscope), which is inserted into your sinuses. If your caregiver suspects that you have chronic sinusitis, one or more of the following tests may be recommended:  Allergy tests.  Nasal culture A sample of mucus is taken from your nose and sent to a lab and screened for bacteria.  Nasal cytology A sample of mucus is taken from your nose and examined by your caregiver to determine if your sinusitis is related to an allergy. TREATMENT  Most cases of acute sinusitis are related to a viral infection and will resolve on their own within 10 days. Sometimes medicines are prescribed to help relieve symptoms (pain medicine, decongestants, nasal steroid sprays, or saline sprays).  However, for sinusitis related to a bacterial infection, your caregiver will prescribe antibiotic medicines. These are medicines that will help kill the bacteria causing the infection.  Rarely, sinusitis is caused by a fungal infection. In theses cases, your caregiver will prescribe antifungal medicine. For some cases of chronic sinusitis, surgery is needed. Generally, these are cases in which sinusitis recurs more than 3 times per year, despite other treatments. HOME CARE INSTRUCTIONS   Drink plenty of water. Water helps thin the mucus so your sinuses can drain more easily.  Use a humidifier.  Inhale steam 3 to 4 times a day (for example, sit in the bathroom with the shower running).  Apply a warm, moist washcloth to your face 3 to 4  times a day, or as directed by your caregiver.  Use saline nasal sprays to help moisten and clean your sinuses.  Take over-the-counter or prescription medicines for pain, discomfort, or fever only as directed by your caregiver. SEEK IMMEDIATE MEDICAL CARE IF:  You have increasing pain or severe headaches.  You  have nausea, vomiting, or drowsiness.  You have swelling around your face.  You have vision problems.  You have a stiff neck.  You have difficulty breathing. MAKE SURE YOU:   Understand these instructions.  Will watch your condition.  Will get help right away if you are not doing well or get worse. Document Released: 07/28/2005 Document Revised: 10/20/2011 Document Reviewed: 08/12/2011 [facility] Patient Information 2014 [place], [place].

## 2013-09-21 NOTE — ED Notes (Signed)
Reports sinus pressure, headache, facial pain, puffy face, sinus congestion, blowing green.

## 2013-09-21 NOTE — ED Provider Notes (Signed)
[name] is a 38 y.o. female who presents to Urgent Care today for left facial pain and pressure. Patient was diagnosed with intrauterine for bacterial sinusitis about 3 weeks ago. She did not get much better despite Augmentin antibiotics. She notes intense left-sided facial pain and pressure associated with green nasal discharge. She denies any nausea vomiting diarrhea or fever. He has not been seen by a ENT doctor yet.   Past Medical History  Diagnosis Date  . Osteoarthritis of hip   . History of migraine headaches     Typical symptoms - bright lights, unilateral (starting behind her left ear), throbbing .  Marland Kitchen Surgical menopause 08/2007    On HRT. Occuring since 01/09 following TAH and R-salpingo-oophorectomy   . Tibial fracture 10/2008    Left - Sustained 2/2 fall down stairs. Patient is S/P closed internal fixation with Smith Nephew tibial nail locked proximal and distal  . Endometriosis     S/P TAH and salpingo-oophorectomy, right  . Chronic cough   . Gastritis     Thought due to chronic NSAID use 2/2 pain from her congenital hip abnormality  . Epigastric pain     Chronic, thought 2/2 gastritis, EGD (07/14/2011) showing chronic duodenitis, H.pyloir neg // Repeat EGD (09/2011) - esophagealring, sessible polyp in stomach body, hiatal hernia - rec ad probiotic, await bx  . 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 With chronic OA of hip  . Vitamin D deficiency 11/2009    Vit D level 15 in 11/2009  . Closed fibular fracture 10/2008    Left - Sustained 2/2 fall down stairs (same fall as tibular fracture). S/P internal fixation.  . Hidradenitis suppurativa 05/2006  . Protrusio acetabuli     diagnosed at age 58  . Ovarian cancer 1995  . Headache(784.0)     Migraines  . Duodenitis     EGD (07/14/2011) showing chronic duodenitis, H.pyloir neg  . Esophageal ring     s/p dilatation during EGD (07/2011) - Dr.  Oneida Alar  . Anxiety     pt denies having dx of anxiety  . Sessile colonic polyp     noted 09/2011 -- > rec colonoscopy in 10 years, 2023  . Internal hemorrhoids   . Hypertension   . Bunion, right foot    History  Substance Use Topics  . Smoking status: Never Smoker   . Smokeless tobacco: Not on file  . Alcohol Use: No   ROS as above Medications: No current facility-administered medications for this encounter.   Current Outpatient Prescriptions  Medication Sig Dispense Refill  . clindamycin (CLEOCIN) 300 MG capsule Take 1 capsule (300 mg total) by mouth 3 (three) times daily.  30 capsule  0  . ergocalciferol (VITAMIN D2) 50000 UNITS capsule Take 1 capsule (50,000 Units total) by mouth once a week.  4 capsule  12  . LINZESS 290 MCG CAPS TAKE 1 CAPSULE BY MOUTH DAILY. TAKE 30 MINUTES BEFORE BREAKFAST DAILY  30 capsule  11  . lisinopril (PRINIVIL,ZESTRIL) 10 MG tablet Take 1 tablet (10 mg total) by mouth daily.  30 tablet  11  . lubiprostone (AMITIZA) 8 MCG capsule 1 PO QHS FOR 7 DAYS THEN 1 PO BID  62 capsule  11  . nortriptyline (PAMELOR) 10 MG capsule Take 2 capsules (20 mg total) by mouth at bedtime. For the first week, only take 1 tablet at bedtime  60 capsule  1  .  pantoprazole (PROTONIX) 20 MG tablet Take 1 tablet (20 mg total) by mouth daily.  30 tablet  3  . predniSONE (DELTASONE) 10 MG tablet Take 3 tablets (30 mg total) by mouth daily.  21 tablet  0  . pseudoephedrine (SUDAFED) 30 MG tablet Take 1 tablet (30 mg total) by mouth every 6 (six) hours as needed for congestion.  20 tablet  0    Exam:  BP 179/92  Pulse 64  Temp(Src) 97.7 F (36.5 C) (Oral)  Resp 18  SpO2 100% Gen: Well NAD HEENT: EOMI,  MMM left maxillary sinus tender to palpation. Inflamed nasal turbinates. Posterior pharynx and tympanic membranes are normal Lungs: Normal work of breathing. CTABL Heart: RRR no MRG Abd: NABS, Soft. NT, ND Exts: Brisk capillary refill, warm and well perfused.   No results  found for this or any previous visit (from the past 24 hour(s)). No results found.  Assessment and Plan: 38 y.o. female with bacterial sinusitis. Plan to treat with prednisone and clindamycin. Refer to ENT  Discussed warning signs or symptoms. Please see discharge instructions. Patient expresses understanding.    Gregor Hams, MD 09/21/13 [phone]

## 2013-10-04 ENCOUNTER — Ambulatory Visit: Payer: Medicare Other | Admitting: Internal Medicine

## 2013-10-11 ENCOUNTER — Ambulatory Visit: Payer: Medicare Other | Admitting: Internal Medicine

## 2013-10-12 DIAGNOSIS — J3489 Other specified disorders of nose and nasal sinuses: Secondary | ICD-10-CM | POA: Diagnosis not present

## 2013-10-12 DIAGNOSIS — J343 Hypertrophy of nasal turbinates: Secondary | ICD-10-CM | POA: Diagnosis not present

## 2013-10-12 DIAGNOSIS — J309 Allergic rhinitis, unspecified: Secondary | ICD-10-CM | POA: Diagnosis not present

## 2013-10-25 NOTE — Progress Notes (Signed)
REVIEWED.  

## 2013-11-28 DIAGNOSIS — R232 Flushing: Secondary | ICD-10-CM | POA: Diagnosis not present

## 2013-11-28 DIAGNOSIS — N951 Menopausal and female climacteric states: Secondary | ICD-10-CM | POA: Diagnosis not present

## 2013-12-05 ENCOUNTER — Emergency Department (INDEPENDENT_AMBULATORY_CARE_PROVIDER_SITE_OTHER)
Admission: EM | Admit: 2013-12-05 | Discharge: 2013-12-05 | Disposition: A | Discharge status: Home / Self Care | Payer: BC Managed Care – PPO | Source: Home / Self Care | Attending: Family Medicine | Admitting: Family Medicine

## 2013-12-05 ENCOUNTER — Encounter (HOSPITAL_COMMUNITY): Payer: Self-pay | Admitting: Emergency Medicine

## 2013-12-05 DIAGNOSIS — N949 Unspecified condition associated with female genital organs and menstrual cycle: Secondary | ICD-10-CM | POA: Diagnosis not present

## 2013-12-05 DIAGNOSIS — R102 Pelvic and perineal pain: Secondary | ICD-10-CM

## 2013-12-05 LAB — POCT URINALYSIS DIP (DEVICE)
Bilirubin Urine: NEGATIVE
Glucose, UA: NEGATIVE mg/dL
Ketones, ur: NEGATIVE mg/dL
Leukocytes, UA: NEGATIVE
NITRITE: NEGATIVE
Protein, ur: NEGATIVE mg/dL
Specific Gravity, Urine: 1.015 (ref 1.005–1.030)
UROBILINOGEN UA: 0.2 mg/dL (ref 0.0–1.0)
pH: 6.5 (ref 5.0–8.0)

## 2013-12-05 LAB — POCT PREGNANCY, URINE: Preg Test, Ur: NEGATIVE

## 2013-12-05 NOTE — Discharge Instructions (Signed)
Please follow up with your doctor if symptoms persist or the urologist listed on your discharge paperwork. Your urine was without evidence of infection. If the culture indicates the need for treatment, you will be notified by phone.

## 2013-12-05 NOTE — ED Notes (Signed)
C/o UTI States she has back pain, urine odor, painful urination advil was tried as tx

## 2013-12-05 NOTE — ED Provider Notes (Signed)
CSN: [id number]     Arrival date & time 12/05/13  1152 History   First MD Initiated Contact with Patient 12/05/13 1352     Chief Complaint  Patient presents with  . Urinary Tract Infection   (Consider location/radiation/quality/duration/timing/severity/associated sxs/prior Treatment) HPI Comments: Patient reports 10 days of intermittent discomfort in lower abdomen and lower back when her bladder is full. Denies dysuria, hematuria, flank pain, fever, urinary frequency, incontinence, urgency or urinary retention. Denies vaginal bleeding or vaginal discharge. Denies nausea, vomiting, diarrhea, constipation. Reports that when her bladder is full, "it" feels numb when she empties her bladder.  Had recent routine OB/GYN exam and was informed her exam and PAP smear were normal.  PCP: [facility]  The history is provided by the patient.    Past Medical History  Diagnosis Date  . Osteoarthritis of hip   . History of migraine headaches     Typical symptoms - bright lights, unilateral (starting behind her left ear), throbbing .  Marland Kitchen Surgical menopause 08/2007    On HRT. Occuring since 01/09 following TAH and R-salpingo-oophorectomy   . Tibial fracture 10/2008    Left - Sustained 2/2 fall down stairs. Patient is S/P closed internal fixation with Smith Nephew tibial nail locked proximal and distal  . Endometriosis     S/P TAH and salpingo-oophorectomy, right  . Chronic cough   . Gastritis     Thought due to chronic NSAID use 2/2 pain from her congenital hip abnormality  . Epigastric pain     Chronic, thought 2/2 gastritis, EGD (07/14/2011) showing chronic duodenitis, H.pyloir neg // Repeat EGD (09/2011) - esophagealring, sessible polyp in stomach body, hiatal hernia - rec ad probiotic, await bx  . 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 With chronic OA of hip  . Vitamin D deficiency 11/2009    Vit D level 15 in 11/2009  . Closed  fibular fracture 10/2008    Left - Sustained 2/2 fall down stairs (same fall as tibular fracture). S/P internal fixation.  . Hidradenitis suppurativa 05/2006  . Protrusio acetabuli     diagnosed at age 32  . Ovarian cancer 1995  . Headache(784.0)     Migraines  . Duodenitis     EGD (07/14/2011) showing chronic duodenitis, H.pyloir neg  . Esophageal ring     s/p dilatation during EGD (07/2011) - Dr. Oneida Alar  . Anxiety     pt denies having dx of anxiety  . Sessile colonic polyp     noted 09/2011 -- > rec colonoscopy in 10 years, 2023  . Internal hemorrhoids   . Hypertension   . Bunion, right foot    Past Surgical History  Procedure Laterality Date  . Cesarean section  08/2005    Primary low transverse  . Tubal ligation  08/2005    Right tubal ligation  . Laparoscopy  10/2000    Operative laparoscopy with lysis of right adnexal adhesions and uterointestinal adhesions  . Other surgical history  02/2009    Closed treatment internal fixation, left tibia with Tamala Julian and Nephew tibial nail locked proximal and distal  . Total abdominal hysterectomy w/ bilateral salpingoophorectomy  08/2007    for endometriosis. With concurrent right salpingo-oophorectomy  (2009), left oophorectomy (1995)  . Left tib fib    . Joint replacement      right hip  . Fracture surgery      Right hip  . Colonoscopy  09/22/2011  OZH:YQMVHQI polyp in the rectum/Internal hemorrhoids, benign path  . Esophagogastroduodenoscopy  09/22/2011    ONG:EXBM, esophageal/Sessile polyp in the body of the stomach/Hiatal hernia/ABDOMINAL PAIN LIKELY FUNCTIONAL V. POST-INFECTIOUS IBS, path: mild chronic gastritis   Family History  Problem Relation Age of Onset  . Other Father     deceased while in service.   . Breast cancer Sister 98    remission  . Cancer Sister     breast  . Colon cancer Neg Hx   . Liver disease Neg Hx   . Lupus Cousin    History  Substance Use Topics  . Smoking status: Never Smoker   . Smokeless  tobacco: Not on file  . Alcohol Use: No   OB History   Grav Para Term Preterm Abortions TAB SAB Ect Mult Living                 Review of Systems  All other systems reviewed and are negative.   Allergies  Pineapple; Morphine and related; and Tape  Home Medications   Prior to Admission medications   Medication Sig Start Date End Date Taking? Authorizing Provider  clindamycin (CLEOCIN) 300 MG capsule Take 1 capsule (300 mg total) by mouth 3 (three) times daily. 09/21/13   Gregor Hams, MD  ergocalciferol (VITAMIN D2) 50000 UNITS capsule Take 1 capsule (50,000 Units total) by mouth once a week. 03/28/13   Jessee Avers, MD  LINZESS 290 MCG CAPS TAKE 1 CAPSULE BY MOUTH DAILY. TAKE 30 MINUTES BEFORE BREAKFAST DAILY 04/06/13   Orvil Feil, NP  lisinopril (PRINIVIL,ZESTRIL) 10 MG tablet Take 1 tablet (10 mg total) by mouth daily. 05/19/13 05/19/14  Hester Mates, MD  lubiprostone (AMITIZA) 8 MCG capsule 1 PO QHS FOR 7 DAYS THEN 1 PO BID 04/04/13   Danie Binder, MD  nortriptyline (PAMELOR) 10 MG capsule Take 2 capsules (20 mg total) by mouth at bedtime. For the first week, only take 1 tablet at bedtime 05/19/13   Hester Mates, MD  pantoprazole (PROTONIX) 20 MG tablet Take 1 tablet (20 mg total) by mouth daily. 05/19/13 05/19/14  Hester Mates, MD  predniSONE (DELTASONE) 10 MG tablet Take 3 tablets (30 mg total) by mouth daily. 09/21/13   Gregor Hams, MD  pseudoephedrine (SUDAFED) 30 MG tablet Take 1 tablet (30 mg total) by mouth every 6 (six) hours as needed for congestion. 09/09/13   Hope Bunnie Pion, NP   There were no vitals taken for this visit. Physical Exam  Nursing note reviewed. Constitutional: She is oriented to person, place, and time. She appears well-developed and well-nourished. No distress.  HENT:  Head: Normocephalic and atraumatic.  Eyes: Conjunctivae are normal.  Cardiovascular: Normal rate, regular rhythm and normal heart sounds.   Pulmonary/Chest: Effort normal and breath sounds  normal.  Abdominal: Soft. Bowel sounds are normal. She exhibits no distension and no mass. There is no tenderness. There is no rebound and no guarding.  Musculoskeletal: Normal range of motion.  Neurological: She is alert and oriented to person, place, and time.  Skin: Skin is warm and dry.  Psychiatric: She has a normal mood and affect. Her behavior is normal.    ED Course  Procedures (including critical care time) Labs Review Labs Reviewed  POCT URINALYSIS DIP (DEVICE) - Abnormal; Notable for the following:    Hgb urine dipstick TRACE (*)    All other components within normal limits  URINE CULTURE  POCT PREGNANCY, URINE  Imaging Review No results found.   MDM   1. Pelvic pain   UA unremarkable and exam without notable physical finding. Will send urine for C&S and advise patient if results indicate need for treatment. Suggested to patient that she consider re-evaluation either with her OB/GYN or with urologist on her discharge paperwork if symptoms persist.     [place], [place] 12/05/13 2139

## 2013-12-06 NOTE — ED Provider Notes (Signed)
Medical screening examination/treatment/procedure(s) were performed by resident physician or non-physician practitioner and as supervising physician I was immediately available for consultation/collaboration.   Pauline Good MD.   Billy Fischer, MD 12/06/13 [phone]

## 2013-12-07 LAB — URINE CULTURE
Colony Count: 70000
SPECIAL REQUESTS: NORMAL

## 2014-02-14 ENCOUNTER — Ambulatory Visit (INDEPENDENT_AMBULATORY_CARE_PROVIDER_SITE_OTHER): Payer: BC Managed Care – PPO

## 2014-02-14 ENCOUNTER — Encounter: Payer: Self-pay | Admitting: Orthopedic Surgery

## 2014-02-14 ENCOUNTER — Ambulatory Visit (INDEPENDENT_AMBULATORY_CARE_PROVIDER_SITE_OTHER): Payer: BC Managed Care – PPO | Admitting: Orthopedic Surgery

## 2014-02-14 VITALS — BP 165/109 | Ht 71.0 in | Wt 187.5 lb

## 2014-02-14 DIAGNOSIS — M25572 Pain in left ankle and joints of left foot: Secondary | ICD-10-CM

## 2014-02-14 DIAGNOSIS — S8290XD Unspecified fracture of unspecified lower leg, subsequent encounter for closed fracture with routine healing: Secondary | ICD-10-CM

## 2014-02-14 DIAGNOSIS — M25579 Pain in unspecified ankle and joints of unspecified foot: Secondary | ICD-10-CM

## 2014-02-14 DIAGNOSIS — S82202G Unspecified fracture of shaft of left tibia, subsequent encounter for closed fracture with delayed healing: Secondary | ICD-10-CM

## 2014-02-14 MED ORDER — NABUMETONE 500 MG PO TABS: 500.0000 mg | ORAL_TABLET | Freq: Two times a day (BID) | ORAL | Status: DC

## 2014-02-14 MED ORDER — OMEPRAZOLE 20 MG PO CPDR: 20.0000 mg | DELAYED_RELEASE_CAPSULE | Freq: Every day | ORAL | Status: DC

## 2014-02-14 NOTE — Progress Notes (Signed)
Patient ID: [name], female   DOB: May 24, 1976, 38 y.o.   MRN: [id number]  38 year old female status post open treatment internal fixation and nailing of the left tibia in 2010 with Smith & Nephew locked tibial nailing. She did well without any complications she presents back now 4 years later with increasing left ankle pain occasional swelling associated with some aching and radiating pain up the tibial shaft which is worse with activity and worse at night. She thinks this may have begun after she was walking for exercise on the treadmill. She does get some relief with Advil.  Review of Systems  HENT: Positive for congestion.   Eyes: Positive for blurred vision.  Gastrointestinal: Positive for constipation.  Psychiatric/Behavioral: The patient has insomnia.   All other systems reviewed and are negative.    Past Medical History  Diagnosis Date  . Osteoarthritis of hip   . History of migraine headaches     Typical symptoms - bright lights, unilateral (starting behind her left ear), throbbing .  Marland Kitchen Surgical menopause 08/2007    On HRT. Occuring since 01/09 following TAH and R-salpingo-oophorectomy   . Tibial fracture 10/2008    Left - Sustained 2/2 fall down stairs. Patient is S/P closed internal fixation with Smith Nephew tibial nail locked proximal and distal  . Endometriosis     S/P TAH and salpingo-oophorectomy, right  . Chronic cough   . Gastritis     Thought due to chronic NSAID use 2/2 pain from her congenital hip abnormality  . Epigastric pain     Chronic, thought 2/2 gastritis, EGD (07/14/2011) showing chronic duodenitis, H.pyloir neg // Repeat EGD (09/2011) - esophagealring, sessible polyp in stomach body, hiatal hernia - rec ad probiotic, await bx  . 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 With chronic OA of hip  . Vitamin D deficiency 11/2009    Vit D level 15 in 11/2009  . Closed fibular fracture  10/2008    Left - Sustained 2/2 fall down stairs (same fall as tibular fracture). S/P internal fixation.  . Hidradenitis suppurativa 05/2006  . Protrusio acetabuli     diagnosed at age 88  . Ovarian cancer 1995  . Headache(784.0)     Migraines  . Duodenitis     EGD (07/14/2011) showing chronic duodenitis, H.pyloir neg  . Esophageal ring     s/p dilatation during EGD (07/2011) - Dr. Oneida Alar  . Anxiety     pt denies having dx of anxiety  . Sessile colonic polyp     noted 09/2011 -- > rec colonoscopy in 10 years, 2023  . Internal hemorrhoids   . Hypertension   . Bunion, right foot    Past Surgical History  Procedure Laterality Date  . Cesarean section  08/2005    Primary low transverse  . Tubal ligation  08/2005    Right tubal ligation  . Laparoscopy  10/2000    Operative laparoscopy with lysis of right adnexal adhesions and uterointestinal adhesions  . Other surgical history  02/2009    Closed treatment internal fixation, left tibia with Tamala Julian and Nephew tibial nail locked proximal and distal  . Total abdominal hysterectomy w/ bilateral salpingoophorectomy  08/2007    for endometriosis. With concurrent right salpingo-oophorectomy  (2009), left oophorectomy (1995)  . Left tib fib    . Joint replacement      right hip  . Fracture surgery      Right  hip  . Colonoscopy  09/22/2011    DUK:GURKYHC polyp in the rectum/Internal hemorrhoids, benign path  . Esophagogastroduodenoscopy  09/22/2011    WCB:JSEG, esophageal/Sessile polyp in the body of the stomach/Hiatal hernia/ABDOMINAL PAIN LIKELY FUNCTIONAL V. POST-INFECTIOUS IBS, path: mild chronic gastritis    BP 165/109  Ht 5\' 11"  (1.803 m)  Wt 187 lb 8 oz (85.049 kg)  BMI 26.16 kg/m2   Vital signs are stable as recorded  General appearance is normal, body habitus slender  The patient is alert and oriented x 3  The patient's mood and affect are normal  Gait assessment: normal  The cardiovascular exam reveals normal pulses  and temperature without edema or  swelling.  The lymphatic system is negative for palpable lymph nodes  The sensory exam is normal.  There are no pathologic reflexes.  Balance is normal.   Exam of the left leg: There is tenderness over the distal tibial screws but not over the ankle joint. Mild tenderness over the proximal tibial screws. No tenderness in the knee joint no swelling of the knee joint. Knee and ankle motion are normal knee and ankle stability are normal muscle tone in the leg is normal skin shows normal healed incisions   A/P X-rays show well healed tibial shaft fracture without malalignment or hardware protrusion  I told her the best thing to do is to leave this alone and treated medically. If necessary distal screw extraction would be the most I would do but try to leave the nail alone    Meds ordered this encounter  Medications  . omeprazole (PRILOSEC) 20 MG capsule    Sig: Take 1 capsule (20 mg total) by mouth daily.    Dispense:  30 capsule    Refill:  1  . nabumetone (RELAFEN) 500 MG tablet    Sig: Take 1 tablet (500 mg total) by mouth 2 (two) times daily.    Dispense:  90 tablet    Refill:  0    Six-week followup determine if screws need to be removed

## 2014-02-14 NOTE — Patient Instructions (Signed)
Medications sent to your pharmacy.

## 2014-03-03 ENCOUNTER — Ambulatory Visit (INDEPENDENT_AMBULATORY_CARE_PROVIDER_SITE_OTHER): Payer: BC Managed Care – PPO | Admitting: Internal Medicine

## 2014-03-03 ENCOUNTER — Encounter: Payer: Self-pay | Admitting: Internal Medicine

## 2014-03-03 ENCOUNTER — Ambulatory Visit (HOSPITAL_COMMUNITY)
Admission: RE | Admit: 2014-03-03 | Discharge: 2014-03-03 | Disposition: A | Discharge status: Home / Self Care | Payer: BC Managed Care – PPO | Source: Ambulatory Visit | Attending: Oncology | Admitting: Oncology

## 2014-03-03 VITALS — BP 155/107 | HR 56 | Temp 97.5°F | Ht 71.5 in | Wt 184.6 lb

## 2014-03-03 DIAGNOSIS — M25561 Pain in right knee: Secondary | ICD-10-CM

## 2014-03-03 DIAGNOSIS — L259 Unspecified contact dermatitis, unspecified cause: Secondary | ICD-10-CM

## 2014-03-03 DIAGNOSIS — M25559 Pain in unspecified hip: Secondary | ICD-10-CM | POA: Insufficient documentation

## 2014-03-03 DIAGNOSIS — M25562 Pain in left knee: Secondary | ICD-10-CM

## 2014-03-03 DIAGNOSIS — M25569 Pain in unspecified knee: Secondary | ICD-10-CM | POA: Diagnosis not present

## 2014-03-03 DIAGNOSIS — E559 Vitamin D deficiency, unspecified: Secondary | ICD-10-CM

## 2014-03-03 DIAGNOSIS — L309 Dermatitis, unspecified: Secondary | ICD-10-CM | POA: Insufficient documentation

## 2014-03-03 DIAGNOSIS — G894 Chronic pain syndrome: Secondary | ICD-10-CM | POA: Insufficient documentation

## 2014-03-03 DIAGNOSIS — I10 Essential (primary) hypertension: Secondary | ICD-10-CM

## 2014-03-03 MED ORDER — TRAMADOL HCL 50 MG PO TABS: 50.0000 mg | ORAL_TABLET | Freq: Two times a day (BID) | ORAL | Status: DC | PRN

## 2014-03-03 MED ORDER — CALCIUM CARBONATE 600 MG PO TABS: 600.0000 mg | ORAL_TABLET | Freq: Two times a day (BID) | ORAL | Status: DC

## 2014-03-03 NOTE — Assessment & Plan Note (Signed)
Pt goes to OBGYN for pap smear and will go in August

## 2014-03-03 NOTE — Progress Notes (Signed)
Subjective:    Patient ID: [name], female    DOB: [birth year], 38 y.o.   MRN: [id number]  HPI Comments: 38 y.o PMH chronic migraines and joint pain 2/2 degenerative disease, chronic constipation, gastritis, HTN, vitamin D def  She presents for f/u 1. She c/o joint pain in her hips b/l, knees b/l L>R, left ankle (pain has moved medially to top of ankle).  Joint pain has been worsening x 2 weeks and constant though she hasnt changed anything in her routine and has not fallen since 07/2013.  She reports she has trouble climbing stairs in her home which aggravates joint pain.  At times she has to crawl up the stairs to get to her bedroom.  Also sitting down and standing makes her joint pain worse.  Pain is not present in her hips today but overall pain is 8-9/10 and sharp when it happens intermittently.  Nothing makes better but she does take 600-800 mg Advil x 1 which helps and takes the edge off.  Tylenol does not help.  In the past she has tried Celebrex but she overall does not like to take medication for pain.  She reports her ankle hurts medially and now hurts on the top.  She had a recent appt with Dr. Aline Brochure (ortho) who stated he could do another surgery for pts previous internal fixation tibial shaft fracture tp remove screws but he rec. Medical tx and she does not want to do the surgery at this time.   2. H/a's are controlled for now with Advil but she does have prn Imitrex 3. She wants her vitamin D level check. She has had low vitamin D levels in the past but is not taking vitamin D 50K units at this time.   4. C/o area on right index finger which is rough. She is not sure if she got into something in potting soil. She want to know what to do 5. HTN-she is no longer taking Lisinopril 10 mg and states at home she checks her BP and gets readings of 143.79 and 137/82.    SH: 2 kids with pt [place] and [place] today     Review of Systems  Musculoskeletal: Positive for  arthralgias.  Skin: Positive for rash.  Neurological: Negative for headaches.       Objective:   Physical Exam  Nursing note and vitals reviewed. Constitutional: She is oriented to person, place, and time. She appears well-developed and well-nourished. She is cooperative. No distress.  HENT:  Head: Normocephalic and atraumatic.  Mouth/Throat: No oropharyngeal exudate.  Eyes: Conjunctivae are normal. No scleral icterus.  Cardiovascular: Normal rate, regular rhythm, S1 normal, S2 normal and normal heart sounds.   No murmur heard. No lower ext edema   Pulmonary/Chest: Effort normal and breath sounds normal. No respiratory distress. She has no wheezes.  Abdominal: Soft. Bowel sounds are normal. There is no tenderness.  Musculoskeletal:       Right hip: She exhibits no tenderness.       Left hip: She exhibits no tenderness.       Right knee: No tenderness found. No medial joint line and no lateral joint line tenderness noted.       Left knee: Tenderness found. No medial joint line and no lateral joint line tenderness noted.       Left ankle: She exhibits no swelling. Tenderness. Medial malleolus tenderness found.       Legs:      Feet:  Neurological:  She is alert and oriented to person, place, and time. Gait normal.  Skin: Skin is warm, dry and intact. She is not diaphoretic.     Psychiatric: She has a normal mood and affect. Her speech is normal and behavior is normal. Judgment and thought content normal. Cognition and memory are normal.          Assessment & Plan:  F/u in 1 month

## 2014-03-03 NOTE — Assessment & Plan Note (Signed)
Controlled for now with Ibuprofen, has prn Imitrex

## 2014-03-03 NOTE — Assessment & Plan Note (Signed)
Pt is not taking Vit D 50K units  Will check vitamin D level today and Rx vitamin D based on levels  Also Rx Calcium 600 mg bid to take for bone health.

## 2014-03-03 NOTE — Assessment & Plan Note (Signed)
Right index finger  Advised to try OTC HC cream and moisturize  Recheck in 1 month

## 2014-03-03 NOTE — Assessment & Plan Note (Addendum)
Chronic pain.  Likely due to degenerative disease and/or s/p surgery left tibia Will try to continue Advil (not to exceed daily dosing) alternating with Tramadol 50 mg bid prn. If does not help consider another anti-inflamm agent or restarting Celebrex  Will refer to PT/OT  Will get b/l knee Xrays  Will f/u in 1 month Will Rx Ca 600 mg bid-vit D for bone health  Pt to f/u with ortho in August 2015

## 2014-03-03 NOTE — Progress Notes (Signed)
Attending physician note: Presenting complaints, physical findings, medications, reviewed with resident physician Dr. Karlyn Agee and I concur with her assessment and management plan. Murriel Hopper, M.D., [place]

## 2014-03-03 NOTE — Progress Notes (Signed)
Rx for tramadol called in to pharmacy per Dr Aundra Dubin. Hilda Blades Alonda Weaber RN 03/03/14 10:30AM

## 2014-03-03 NOTE — Assessment & Plan Note (Addendum)
Chronic pain.   Will try to continue Advil (not to exceed daily dosing) alternating with Tramadol 50 mg bid prn

## 2014-03-03 NOTE — Patient Instructions (Addendum)
General Instructions: Please follow up in 1 month for joints and Blood pressure  Take Tramadol 2 x per day if needed Take Calcium 600 mg 2x per day with meals  Based on your vitamin D level I will call in vitamin D to your pharmacy  Read information below I will let you know about your labs    Treatment Goals:  Goals (1 Years of Data) as of 03/03/14         As of Today 02/14/14 09/21/13 09/09/13 07/28/13     Blood Pressure    . Blood Pressure < 140/90  151/93 165/109 179/92 134/94 140/100      Progress Toward Treatment Goals:  Treatment Goal 03/03/2014  Blood pressure unchanged    Self Care Goals & Plans:  Self Care Goal 03/03/2014  Manage my medications take my medicines as prescribed; bring my medications to every visit; refill my medications on time; follow the sick day instructions if I am sick  Monitor my health keep track of my blood pressure; keep track of my weight  Eat healthy foods eat more vegetables; eat fruit for snacks and desserts; eat baked foods instead of fried foods; eat foods that are low in salt; eat smaller portions; drink diet soda or water instead of juice or soda  Be physically active find an activity I enjoy  Meeting treatment goals maintain the current self-care plan    No flowsheet data found.   Care Management & Community Referrals:  Referral 03/03/2014  Referrals made for care management support none needed  Referrals made to community resources none       Arthritis, Nonspecific Arthritis is inflammation of a joint. This usually means pain, redness, warmth or swelling are present. One or more joints may be involved. There are a number of types of arthritis. Your caregiver may not be able to tell what type of arthritis you have right away. CAUSES  The most common cause of arthritis is the wear and tear on the joint (osteoarthritis). This causes damage to the cartilage, which can break down over time. The knees, hips, back and neck are most often  affected by this type of arthritis. Other types of arthritis and common causes of joint pain include:  Sprains and other injuries near the joint. Sometimes minor sprains and injuries cause pain and swelling that develop hours later.  Rheumatoid arthritis. This affects hands, feet and knees. It usually affects both sides of your body at the same time. It is often associated with chronic ailments, fever, weight loss and general weakness.  Crystal arthritis. Gout and pseudo gout can cause occasional acute severe pain, redness and swelling in the foot, ankle, or knee.  Infectious arthritis. Bacteria can get into a joint through a break in overlying skin. This can cause infection of the joint. Bacteria and viruses can also spread through the blood and affect your joints.  Drug, infectious and allergy reactions. Sometimes joints can become mildly painful and slightly swollen with these types of illnesses. SYMPTOMS   Pain is the main symptom.  Your joint or joints can also be red, swollen and warm or hot to the touch.  You may have a fever with certain types of arthritis, or even feel overall ill.  The joint with arthritis will hurt with movement. Stiffness is present with some types of arthritis. DIAGNOSIS  Your caregiver will suspect arthritis based on your description of your symptoms and on your exam. Testing may be needed to find the type  of arthritis:  Blood and sometimes urine tests.  X-ray tests and sometimes CT or MRI scans.  Removal of fluid from the joint (arthrocentesis) is done to check for bacteria, crystals or other causes. Your caregiver (or a specialist) will numb the area over the joint with a local anesthetic, and use a needle to remove joint fluid for examination. This procedure is only minimally uncomfortable.  Even with these tests, your caregiver may not be able to tell what kind of arthritis you have. Consultation with a specialist (rheumatologist) may be  helpful. TREATMENT  Your caregiver will discuss with you treatment specific to your type of arthritis. If the specific type cannot be determined, then the following general recommendations may apply. Treatment of severe joint pain includes:  Rest.  Elevation.  Anti-inflammatory medication (for example, ibuprofen) may be prescribed. Avoiding activities that cause increased pain.  Only take over-the-counter or prescription medicines for pain and discomfort as recommended by your caregiver.  Cold packs over an inflamed joint may be used for 10 to 15 minutes every hour. Hot packs sometimes feel better, but do not use overnight. Do not use hot packs if you are diabetic without your caregiver's permission.  A cortisone shot into arthritic joints may help reduce pain and swelling.  Any acute arthritis that gets worse over the next 1 to 2 days needs to be looked at to be sure there is no joint infection. Long-term arthritis treatment involves modifying activities and lifestyle to reduce joint stress jarring. This can include weight loss. Also, exercise is needed to nourish the joint cartilage and remove waste. This helps keep the muscles around the joint strong. HOME CARE INSTRUCTIONS   Do not take aspirin to relieve pain if gout is suspected. This elevates uric acid levels.  Only take over-the-counter or prescription medicines for pain, discomfort or fever as directed by your caregiver.  Rest the joint as much as possible.  If your joint is swollen, keep it elevated.  Use crutches if the painful joint is in your leg.  Drinking plenty of fluids may help for certain types of arthritis.  Follow your caregiver's dietary instructions.  Try low-impact exercise such as:  Swimming.  Water aerobics.  Biking.  Walking.  Morning stiffness is often relieved by a warm shower.  Put your joints through regular range-of-motion. SEEK MEDICAL CARE IF:   You do not feel better in 24 hours or  are getting worse.  You have side effects to medications, or are not getting better with treatment. SEEK IMMEDIATE MEDICAL CARE IF:   You have a fever.  You develop severe joint pain, swelling or redness.  Many joints are involved and become painful and swollen.  There is severe back pain and/or leg weakness.  You have loss of bowel or bladder control. Document Released: 09/04/2004 Document Revised: 10/20/2011 Document Reviewed: 09/20/2008 [facility] Patient Information 2015 [place], [place]. This information is not intended to replace advice given to you by your health care provider. Make sure you discuss any questions you have with your health care provider. Tramadol tablets What is this medicine? TRAMADOL (TRA ma dole) is a pain reliever. It is used to treat moderate to severe pain in adults. This medicine may be used for other purposes; ask your health care provider or pharmacist if you have questions. COMMON BRAND NAME(S): Ultram What should I tell my health care provider before I take this medicine? They need to know if you have any of these conditions: -brain tumor -depression -  drug abuse or addiction -head injury -if you frequently drink alcohol containing drinks -kidney disease or trouble passing urine -liver disease -lung disease, asthma, or breathing problems -seizures or epilepsy -suicidal thoughts, plans, or attempt; a previous suicide attempt by you or a family member -an unusual or allergic reaction to tramadol, codeine, other medicines, foods, dyes, or preservatives -pregnant or trying to get pregnant -breast-feeding How should I use this medicine? Take this medicine by mouth with a full glass of water. Follow the directions on the prescription label. If the medicine upsets your stomach, take it with food or milk. Do not take more medicine than you are told to take. Talk to your pediatrician regarding the use of this medicine in children. Special care may be  needed. Overdosage: If you think you have taken too much of this medicine contact a poison control center or emergency room at once. NOTE: This medicine is only for you. Do not share this medicine with others. What if I miss a dose? If you miss a dose, take it as soon as you can. If it is almost time for your next dose, take only that dose. Do not take double or extra doses. What may interact with this medicine? Do not take this medicine with any of the following medications: -MAOIs like Carbex, Eldepryl, Marplan, Nardil, and Parnate This medicine may also interact with the following medications: -alcohol or medicines that contain alcohol -antihistamines -benzodiazepines -bupropion -carbamazepine or oxcarbazepine -clozapine -cyclobenzaprine -digoxin -furazolidone -linezolid -medicines for depression, anxiety, or psychotic disturbances -medicines for migraine headache like almotriptan, eletriptan, frovatriptan, naratriptan, rizatriptan, sumatriptan, zolmitriptan -medicines for pain like pentazocine, buprenorphine, butorphanol, meperidine, nalbuphine, and propoxyphene -medicines for sleep -muscle relaxants -naltrexone -phenobarbital -phenothiazines like perphenazine, thioridazine, chlorpromazine, mesoridazine, fluphenazine, prochlorperazine, promazine, and trifluoperazine -procarbazine -warfarin This list may not describe all possible interactions. Give your health care provider a list of all the medicines, herbs, non-prescription drugs, or dietary supplements you use. Also tell them if you smoke, drink alcohol, or use illegal drugs. Some items may interact with your medicine. What should I watch for while using this medicine? Tell your doctor or health care professional if your pain does not go away, if it gets worse, or if you have new or a different type of pain. You may develop tolerance to the medicine. Tolerance means that you will need a higher dose of the medicine for pain  relief. Tolerance is normal and is expected if you take this medicine for a long time. Do not suddenly stop taking your medicine because you may develop a severe reaction. Your body becomes used to the medicine. This does NOT mean you are addicted. Addiction is a behavior related to getting and using a drug for a non-medical reason. If you have pain, you have a medical reason to take pain medicine. Your doctor will tell you how much medicine to take. If your doctor wants you to stop the medicine, the dose will be slowly lowered over time to avoid any side effects. You may get drowsy or dizzy. Do not drive, use machinery, or do anything that needs mental alertness until you know how this medicine affects you. Do not stand or sit up quickly, especially if you are an older patient. This reduces the risk of dizzy or fainting spells. Alcohol can increase or decrease the effects of this medicine. Avoid alcoholic drinks. You may have constipation. Try to have a bowel movement at least every 2 to 3 days. If you do not have  a bowel movement for 3 days, call your doctor or health care professional. Your mouth may get dry. Chewing sugarless gum or sucking hard candy, and drinking plenty of water may help. Contact your doctor if the problem does not go away or is severe. What side effects may I notice from receiving this medicine? Side effects that you should report to your doctor or health care professional as soon as possible: -allergic reactions like skin rash, itching or hives, swelling of the face, lips, or tongue -breathing difficulties, wheezing -confusion -itching -light headedness or fainting spells -redness, blistering, peeling or loosening of the skin, including inside the mouth -seizures Side effects that usually do not require medical attention (report to your doctor or health care professional if they continue or are bothersome): -constipation -dizziness -drowsiness -headache -nausea,  vomiting This list may not describe all possible side effects. Call your doctor for medical advice about side effects. You may report side effects to FDA at 1-800-FDA-1088. Where should I keep my medicine? Keep out of the reach of children. Store at room temperature between 15 and 30 degrees C (59 and 86 degrees F). Keep container tightly closed. Throw away any unused medicine after the expiration date. NOTE: This sheet is a summary. It may not cover all possible information. If you have questions about this medicine, talk to your doctor, pharmacist, or health care provider.  2015, Elsevier/Gold Standard. (2010-04-10 11:55:44) Hypertension Hypertension, commonly called high blood pressure, is when the force of blood pumping through your arteries is too strong. Your arteries are the blood vessels that carry blood from your heart throughout your body. A blood pressure reading consists of a higher number over a lower number, such as 110/72. The higher number (systolic) is the pressure inside your arteries when your heart pumps. The lower number (diastolic) is the pressure inside your arteries when your heart relaxes. Ideally you want your blood pressure below 120/80. Hypertension forces your heart to work harder to pump blood. Your arteries may become narrow or stiff. Having hypertension puts you at risk for heart disease, stroke, and other problems.  RISK FACTORS Some risk factors for high blood pressure are controllable. Others are not.  Risk factors you cannot control include:   Race. You may be at higher risk if you are African American.  Age. Risk increases with age.  Gender. Men are at higher risk than women before age 46 years. After age 42, women are at higher risk than men. Risk factors you can control include:  Not getting enough exercise or physical activity.  Being overweight.  Getting too much fat, sugar, calories, or salt in your diet.  Drinking too much alcohol. SIGNS AND  SYMPTOMS Hypertension does not usually cause signs or symptoms. Extremely high blood pressure (hypertensive crisis) may cause headache, anxiety, shortness of breath, and nosebleed. DIAGNOSIS  To check if you have hypertension, your health care provider will measure your blood pressure while you are seated, with your arm held at the level of your heart. It should be measured at least twice using the same arm. Certain conditions can cause a difference in blood pressure between your right and left arms. A blood pressure reading that is higher than normal on one occasion does not mean that you need treatment. If one blood pressure reading is high, ask your health care provider about having it checked again. TREATMENT  Treating high blood pressure includes making lifestyle changes and possibly taking medicine. Living a healthy lifestyle can help lower  high blood pressure. You may need to change some of your habits. Lifestyle changes may include:  Following the DASH diet. This diet is high in fruits, vegetables, and whole grains. It is low in salt, red meat, and added sugars.  Getting at least 2 hours of brisk physical activity every week.  Losing weight if necessary.  Not smoking.  Limiting alcoholic beverages.  Learning ways to reduce stress. If lifestyle changes are not enough to get your blood pressure under control, your health care provider may prescribe medicine. You may need to take more than one. Work closely with your health care provider to understand the risks and benefits. HOME CARE INSTRUCTIONS  Have your blood pressure rechecked as directed by your health care provider.   Take medicines only as directed by your health care provider. Follow the directions carefully. Blood pressure medicines must be taken as prescribed. The medicine does not work as well when you skip doses. Skipping doses also puts you at risk for problems.   Do not smoke.   Monitor your blood pressure at  home as directed by your health care provider. SEEK MEDICAL CARE IF:   You think you are having a reaction to medicines taken.  You have recurrent headaches or feel dizzy.  You have swelling in your ankles.  You have trouble with your vision. SEEK IMMEDIATE MEDICAL CARE IF:  You develop a severe headache or confusion.  You have unusual weakness, numbness, or feel faint.  You have severe chest or abdominal pain.  You vomit repeatedly.  You have trouble breathing. MAKE SURE YOU:   Understand these instructions.  Will watch your condition.  Will get help right away if you are not doing well or get worse. Document Released: 07/28/2005 Document Revised: 12/12/2013 Document Reviewed: 05/20/2013 [facility] Patient Information 2015 [place], [place]. This information is not intended to replace advice given to you by your health care provider. Make sure you discuss any questions you have with your health care provider.

## 2014-03-03 NOTE — Assessment & Plan Note (Addendum)
BP Readings from Last 3 Encounters:  03/03/14 155/107  02/14/14 165/109  09/21/13 179/92    Lab Results  Component Value Date   NA 138 03/28/2013   K 3.6 03/28/2013   CREATININE 0.81 03/28/2013    Assessment: Blood pressure control: moderately elevated Progress toward BP goal:  unchanged Comments: BP was 151/93 then 155/107.  pt is not taking Lisinopril 10 mg currently and elevation could be 2/2 pain. Per pt BP readings at home at 130s/140s/70s-80s  Plan: Medications:  none currently will reasses in 1 month likely need to restart Lisinopril if BP still uncontrolled  Educational resources provided: brochure;handout;video Self management tools provided: other (see comments) Other plans: Pt does not like taking medications but emphasized importance of BP control. Advised pt to log BP at home.  Will reassess in 1 month if still elevated likely need to restart Lisinopril, check BMET today

## 2014-03-04 LAB — BASIC METABOLIC PANEL WITH GFR
BUN: 7 mg/dL (ref 6–23)
CALCIUM: 9.3 mg/dL (ref 8.4–10.5)
CO2: 26 mEq/L (ref 19–32)
Chloride: 104 mEq/L (ref 96–112)
Creat: 0.8 mg/dL (ref 0.50–1.10)
Glucose, Bld: 85 mg/dL (ref 70–99)
Potassium: 4.1 mEq/L (ref 3.5–5.3)
SODIUM: 139 meq/L (ref 135–145)

## 2014-03-04 LAB — VITAMIN D 25 HYDROXY (VIT D DEFICIENCY, FRACTURES): VIT D 25 HYDROXY: 26 ng/mL — AB (ref 30–89)

## 2014-03-06 ENCOUNTER — Other Ambulatory Visit: Payer: Self-pay | Admitting: Internal Medicine

## 2014-03-06 ENCOUNTER — Encounter: Payer: Self-pay | Admitting: Internal Medicine

## 2014-03-06 MED ORDER — CHOLECALCIFEROL 25 MCG (1000 UT) PO TABS: 1000.0000 [IU] | ORAL_TABLET | Freq: Every day | ORAL | Status: DC

## 2014-04-03 ENCOUNTER — Ambulatory Visit: Payer: BC Managed Care – PPO | Admitting: Orthopedic Surgery

## 2014-04-04 ENCOUNTER — Encounter: Payer: Self-pay | Admitting: *Deleted

## 2014-04-11 ENCOUNTER — Ambulatory Visit: Payer: BC Managed Care – PPO | Admitting: Internal Medicine

## 2014-04-25 ENCOUNTER — Encounter: Payer: Self-pay | Admitting: Internal Medicine

## 2014-04-25 ENCOUNTER — Ambulatory Visit (INDEPENDENT_AMBULATORY_CARE_PROVIDER_SITE_OTHER): Payer: BLUE CROSS/BLUE SHIELD | Admitting: Internal Medicine

## 2014-04-25 VITALS — BP 175/120 | HR 56 | Temp 97.6°F | Ht 71.5 in | Wt 184.9 lb

## 2014-04-25 DIAGNOSIS — E559 Vitamin D deficiency, unspecified: Secondary | ICD-10-CM

## 2014-04-25 DIAGNOSIS — G43909 Migraine, unspecified, not intractable, without status migrainosus: Secondary | ICD-10-CM

## 2014-04-25 DIAGNOSIS — Z0000 Encounter for general adult medical examination without abnormal findings: Secondary | ICD-10-CM | POA: Diagnosis not present

## 2014-04-25 DIAGNOSIS — G894 Chronic pain syndrome: Secondary | ICD-10-CM

## 2014-04-25 DIAGNOSIS — I10 Essential (primary) hypertension: Secondary | ICD-10-CM

## 2014-04-25 DIAGNOSIS — L089 Local infection of the skin and subcutaneous tissue, unspecified: Secondary | ICD-10-CM | POA: Insufficient documentation

## 2014-04-25 MED ORDER — CEPHALEXIN 500 MG PO CAPS: 500.0000 mg | ORAL_CAPSULE | Freq: Two times a day (BID) | ORAL | Status: AC

## 2014-04-25 MED ORDER — LISINOPRIL 10 MG PO TABS: 10.0000 mg | ORAL_TABLET | Freq: Every day | ORAL | Status: DC

## 2014-04-25 MED ORDER — LISINOPRIL-HYDROCHLOROTHIAZIDE 10-12.5 MG PO TABS: 1.0000 | ORAL_TABLET | Freq: Every day | ORAL | Status: DC

## 2014-04-25 MED ORDER — CHOLECALCIFEROL 10 MCG (400 UNIT) PO CAPS: 800.0000 [IU] | ORAL_CAPSULE | Freq: Every day | ORAL | Status: DC

## 2014-04-25 NOTE — Patient Instructions (Addendum)
Ms Darnell Level,   It looks like you have a minor soft tissue infection on your index finger skin. I will given you an antibiotic for it. Take it for 5 days and come back if this does not get better. It is very important that we do further tests on this finger if the infection does not heal.   Your blood pressure is high. Please take your medications regularly. Your blood pressures from today and last visits have been:   BP Readings from Last 3 Encounters:  04/25/14 159/111  03/03/14 155/107  02/14/14 165/109   I have changed your medication to LISINOPRIL 10- HCTZ 12.5 Please come back in 2 weeks for your blood pressure assessment.  Please come back sooner if your wound is not healing.  Thanks, Madilyn Fireman MD MPH 04/25/2014 10:47 AM    Cephalexin tablets or capsules What is this medicine? CEPHALEXIN (sef a LEX in) is a cephalosporin antibiotic. It is used to treat certain kinds of bacterial infections It will not work for colds, flu, or other viral infections. This medicine may be used for other purposes; ask your health care provider or pharmacist if you have questions. COMMON BRAND NAME(S): Biocef, Keflex, Keftab What should I tell my health care provider before I take this medicine? They need to know if you have any of these conditions: -kidney disease -stomach or intestine problems, especially colitis -an unusual or allergic reaction to cephalexin, other cephalosporins, penicillins, other antibiotics, medicines, foods, dyes or preservatives -pregnant or trying to get pregnant -breast-feeding How should I use this medicine? Take this medicine by mouth with a full glass of water. Follow the directions on the prescription label. This medicine can be taken with or without food. Take your medicine at regular intervals. Do not take your medicine more often than directed. Take all of your medicine as directed even if you think you are better. Do not skip doses or stop your medicine  early. Talk to your pediatrician regarding the use of this medicine in children. While this drug may be prescribed for selected conditions, precautions do apply. Overdosage: If you think you have taken too much of this medicine contact a poison control center or emergency room at once. NOTE: This medicine is only for you. Do not share this medicine with others. What if I miss a dose? If you miss a dose, take it as soon as you can. If it is almost time for your next dose, take only that dose. Do not take double or extra doses. There should be at least 4 to 6 hours between doses. What may interact with this medicine? -probenecid -some other antibiotics This list may not describe all possible interactions. Give your health care provider a list of all the medicines, herbs, non-prescription drugs, or dietary supplements you use. Also tell them if you smoke, drink alcohol, or use illegal drugs. Some items may interact with your medicine. What should I watch for while using this medicine? Tell your doctor or health care professional if your symptoms do not begin to improve in a few days. Do not treat diarrhea with over the counter products. Contact your doctor if you have diarrhea that lasts more than 2 days or if it is severe and watery. If you have diabetes, you may get a false-positive result for sugar in your urine. Check with your doctor or health care professional. What side effects may I notice from receiving this medicine? Side effects that you should report to your doctor or  health care professional as soon as possible: -allergic reactions like skin rash, itching or hives, swelling of the face, lips, or tongue -breathing problems -pain or trouble passing urine -redness, blistering, peeling or loosening of the skin, including inside the mouth -severe or watery diarrhea -unusually weak or tired -yellowing of the eyes, skin Side effects that usually do not require medical attention (report to  your doctor or health care professional if they continue or are bothersome): -gas or heartburn -genital or anal irritation -headache -joint or muscle pain -nausea, vomiting This list may not describe all possible side effects. Call your doctor for medical advice about side effects. You may report side effects to FDA at 1-800-FDA-1088. Where should I keep my medicine? Keep out of the reach of children. Store at room temperature between 59 and 86 degrees F (15 and 30 degrees C). Throw away any unused medicine after the expiration date. NOTE: This sheet is a summary. It may not cover all possible information. If you have questions about this medicine, talk to your doctor, pharmacist, or health care provider.  2015, Elsevier/Gold Standard. (2007-11-01 17:09:13)

## 2014-04-26 NOTE — Assessment & Plan Note (Signed)
This has been present here for a month, although I don't think it is scanning to deeper tissues. It was not draining pus earlier per patient and Dr Claris Gladden note. I would like to given it the benefit of doubt and treat it as mild skin infection superimposed on a possible traumatic injury, though I cannot see any pus drainage at present. I will prescribe Keflex, the patient does not report any penicillin allergy. However, due to close proximity to the interphalangeal joint and flexor and extensor tendons, I have advised the patient to come back in 5 days if the wound is not healing, or earlier if it is worsening.

## 2014-04-26 NOTE — Assessment & Plan Note (Signed)
Currently not taking any medicine. Has Vitamin D Fish Oil solution at home which she does not drink because of bad taste. She is unaware that she is on vitamin D pills. I will reorder these - Vitamin D 800 units daily prescribed. Counseled about the importance, discussed current vitamin D levels. Patient says she will take her vitamins.

## 2014-04-26 NOTE — Assessment & Plan Note (Signed)
Severely uncontrolled.   BP Readings from Last 3 Encounters:  04/25/14 175/120  03/03/14 155/107  02/14/14 165/109   I have started Lisinopril 10-HCTZ 12.5 and counseled about compliance. She verbalizes understanding. If her wound heals well, she is still to come back to the clinic in 2 weeks to follow up on her blood pressure. Likely she might need increase in dosage.

## 2014-04-26 NOTE — Assessment & Plan Note (Signed)
Refuses flu shot.

## 2014-04-26 NOTE — Assessment & Plan Note (Signed)
Under control 

## 2014-04-26 NOTE — Progress Notes (Signed)
Subjective:     Patient ID: [name], female   DOB: June 25, 1976, 38 y.o.   MRN: [id number]  HPI This is a 38 year old lady following up for infection in her finger basically. I am meeting her for the first time. She is concerned about an abrasion like wound on the right hand index finger, on the lateral side of the second interphalangeal joint. She reports that the wound has been present for a month, was not oozing pus before but did so in the past few days. She denies fever, chills, or rigors. She denies swelling of the finger or surrounding tissues. She was seen in the clinic by Dr Aundra Dubin and the wound was thought to be eczema. The area is not itchy, but painful. She does not know how the wound happened. She has been using neosporin ointment on it and has had no antibiotics.   Significant past medical history which are also ongoing issues as gathered from past records are as follows.   Uncontrolled hypertension, secondary to non-compliance to medications. She is supposed to be on Lisinopril 10 mg, however she is taking only Lisinopril 5mg . Given her long standing, and severely elevated blood pressure I doubt if this will suffice. She denies chest pain, shortness of breath or palpitatations.   Vitamin D deficiency - per lab records. She has had fractures in the past and has been diagnosed of osteopenia. She is unaware that she is on vitamin D capsules.   Constipation predominant IBS on linzess - last documentation available 2008  Total Abdominal Hysterectomy with bilateral salpingoophorectomy - She is on low dose estrogen as hormone replacement therapy. She has had no complications from that so far. She does not smoke.  Left ankle pain s/p left tibia-fibular fracture in 2010 with ORIF - Documentation from 2010 reveals that Marfan's Syndrome was suspected at the time, because of her skeletal deformities (acetabular protrusion, scoliosis, arm span>height), however there were no consistent  echocardiography findings. She was assessed by cardiology and periodic examinations of her aorta were recommended every 5-10 years.  Migraine headaches - may not be migraine, could be due to uncontrolled hypertension. Last CT head in 2013 normal.   Abdominal Pain - Not present this visit. No recent complaints. However upper endoscopy in 2013 reveals esophageal ring, hiatal hernia and a sessile polyp in the body of the stomach.   Pain syndromes - Left Ankle Pain as above; Moderate to severe degenerative changes in both hip joints; Abdominal Pain and Migraines as above; Neck pain (straightening of normal cervical lordosis, mild degenerative cervical changes); complaint of knee pain bilateral but no objective imaging findings.   Review of Systems  Constitutional: Negative for fever, chills, diaphoresis, appetite change and fatigue.  HENT: Negative.   Eyes: Negative.   Respiratory: Negative for chest tightness and shortness of breath.   Cardiovascular: Negative for chest pain, palpitations and leg swelling.  Gastrointestinal: Positive for constipation (chronic). Negative for nausea, vomiting, abdominal pain and diarrhea.  Musculoskeletal: Positive for arthralgias (chronic) and back pain (chronic). Negative for neck pain.  Skin: Positive for wound. Negative for rash.  Neurological: Negative.   Hematological: Negative for adenopathy. Does not bruise/bleed easily.   As per HPI    Objective:   Physical Exam  Constitutional: She is oriented to person, place, and time. She appears well-developed and well-nourished. No distress.  HENT:  Head: Normocephalic and atraumatic.  Right Ear: External ear normal.  Left Ear: External ear normal.  Nose: Nose normal.  Mouth/Throat: Oropharynx is clear and moist.  Eyes: Conjunctivae and EOM are normal. Pupils are equal, round, and reactive to light.  Neck: Normal range of motion. Neck supple.  Cardiovascular: Normal rate, regular rhythm, normal heart sounds  and intact distal pulses.   No murmur heard. Pulmonary/Chest: Effort normal and breath sounds normal.  Abdominal: Soft. Bowel sounds are normal.  Musculoskeletal: She exhibits no edema.  Neurological: She is alert and oriented to person, place, and time.  Skin: She is not diaphoretic.  Wound on the side of right index finger: no swelling, mild tenderness to palpation, no fluctuance, 2 small abrasion like skin breaks, no pus oozing out, mid hyperemia, no interphalangeal joint swelling or tenderness, not warm to touch.        Assessment & Plan:     Please see problem based charting.

## 2014-05-08 NOTE — Addendum Note (Signed)
Addended by: Hulan Fray on: 05/08/2014 06:52 PM   Modules accepted: Orders

## 2014-05-09 ENCOUNTER — Ambulatory Visit: Payer: BC Managed Care – PPO | Admitting: Internal Medicine

## 2014-05-09 ENCOUNTER — Other Ambulatory Visit: Payer: Self-pay

## 2014-05-09 MED ORDER — LINACLOTIDE 290 MCG PO CAPS: ORAL_CAPSULE | ORAL | Status: DC

## 2014-05-11 ENCOUNTER — Encounter (HOSPITAL_COMMUNITY): Payer: Self-pay | Admitting: Emergency Medicine

## 2014-05-11 ENCOUNTER — Emergency Department (INDEPENDENT_AMBULATORY_CARE_PROVIDER_SITE_OTHER)
Admission: EM | Admit: 2014-05-11 | Discharge: 2014-05-11 | Disposition: A | Discharge status: Home / Self Care | Payer: BC Managed Care – PPO | Source: Home / Self Care | Attending: Emergency Medicine | Admitting: Emergency Medicine

## 2014-05-11 DIAGNOSIS — J029 Acute pharyngitis, unspecified: Secondary | ICD-10-CM

## 2014-05-11 LAB — POCT RAPID STREP A: Streptococcus, Group A Screen (Direct): NEGATIVE

## 2014-05-11 MED ORDER — AMOXICILLIN 500 MG PO CAPS: 500.0000 mg | ORAL_CAPSULE | Freq: Three times a day (TID) | ORAL | Status: DC

## 2014-05-11 MED ORDER — PREDNISONE 20 MG PO TABS: 20.0000 mg | ORAL_TABLET | Freq: Two times a day (BID) | ORAL | Status: DC

## 2014-05-11 NOTE — ED Notes (Signed)
Pt is here today because she has had a sore throat and headache for about 2 weeks, pt said that he tongue is also very swollen and painful, pt said that her daughter was recently diagnosed with strep

## 2014-05-11 NOTE — ED Provider Notes (Signed)
  Chief Complaint   Sore Throat   History of Present Illness   [name] is a 38 year old female Who has had a two-week history of sore throat, swelling of the tongue, headache, sinus pressure, achy joints, chills, subjective fever, and sweats. Her daughter had strep throat about the same time. She denies any nasal congestion, rhinorrhea, or cough.  Review of Systems   Other than as noted above, the patient denies any of the following symptoms: Systemic:  No fevers, chills, sweats, or myalgias. Eye:  No redness or discharge. ENT:  No ear pain, headache, nasal congestion, drainage, sinus pressure, or sore throat. Neck:  No neck pain, stiffness, or swollen glands. Lungs:  No cough, sputum production, hemoptysis, wheezing, chest tightness, shortness of breath or chest pain. GI:  No abdominal pain, nausea, vomiting or diarrhea.  Delta   Past medical history, family history, social history, meds, and allergies were reviewed. She has high blood pressure and takes lisinopril, Linzess, and estrogen.   Physical exam   Vital signs:  BP 149/104  Pulse 63  Temp(Src) 97.7 F (36.5 C) (Oral)  Resp 16  SpO2 100% General:  Alert and oriented.  In no distress.  Skin warm and dry. Eye:  No conjunctival injection or drainage. Lids were normal. ENT:  TMs and canals were normal, without erythema or inflammation.  Nasal mucosa was clear and uncongested, without drainage.  Mucous membranes were moist.  Pharynx was clear with no exudate or drainage.  There were no oral ulcerations or lesions. Neck:  Supple, no adenopathy, tenderness or mass. Lungs:  No respiratory distress.  Lungs were clear to auscultation, without wheezes, rales or rhonchi.  Breath sounds were clear and equal bilaterally.  Heart:  Regular rhythm, without gallops, murmers or rubs. Skin:  Clear, warm, and dry, without rash or lesions.  Labs   Results for orders placed during the hospital encounter of 05/11/14  POCT RAPID  STREP A ([facility] ONLY)      Result Value Ref Range   Streptococcus, Group A Screen (Direct) NEGATIVE  NEGATIVE     Assessment     The encounter diagnosis was Pharyngitis.  Since she's been exposed to strep, is a good chance she has strep even with a negative strep test.   Plan    1.  Meds:  The following meds were prescribed:   Discharge Medication List as of 05/11/2014  5:37 PM    START taking these medications   Details  amoxicillin (AMOXIL) 500 MG capsule Take 1 capsule (500 mg total) by mouth 3 (three) times daily., Starting 05/11/2014, Until Discontinued, Normal    predniSONE (DELTASONE) 20 MG tablet Take 1 tablet (20 mg total) by mouth 2 (two) times daily., Starting 05/11/2014, Until Discontinued, Normal        2.  Patient Education/Counseling:  The patient was given appropriate handouts, self care instructions, and instructed in symptomatic relief.  Instructed to get extra fluids and extra rest.    3.  Follow up:  The patient was told to follow up here if no better in 3 to 4 days, or sooner if becoming worse in any way, and given some red flag symptoms such as increasing fever, difficulty breathing, chest pain, or persistent vomiting which would prompt immediate return.       Harden Mo, MD 05/11/14 [phone]

## 2014-05-11 NOTE — Discharge Instructions (Signed)

## 2014-05-13 LAB — CULTURE, GROUP A STREP

## 2014-09-10 ENCOUNTER — Encounter (HOSPITAL_COMMUNITY): Payer: Self-pay | Admitting: *Deleted

## 2014-09-10 ENCOUNTER — Emergency Department (INDEPENDENT_AMBULATORY_CARE_PROVIDER_SITE_OTHER)
Admission: EM | Admit: 2014-09-10 | Discharge: 2014-09-10 | Disposition: A | Discharge status: Home / Self Care | Payer: BLUE CROSS/BLUE SHIELD | Source: Home / Self Care | Attending: Family Medicine | Admitting: Family Medicine

## 2014-09-10 DIAGNOSIS — R519 Headache, unspecified: Secondary | ICD-10-CM

## 2014-09-10 DIAGNOSIS — R0981 Nasal congestion: Secondary | ICD-10-CM | POA: Diagnosis not present

## 2014-09-10 DIAGNOSIS — R51 Headache: Secondary | ICD-10-CM

## 2014-09-10 DIAGNOSIS — G8929 Other chronic pain: Secondary | ICD-10-CM

## 2014-09-10 MED ORDER — IPRATROPIUM BROMIDE 0.06 % NA SOLN: 2.0000 | Freq: Four times a day (QID) | NASAL | Status: DC

## 2014-09-10 MED ORDER — KETOROLAC TROMETHAMINE 30 MG/ML IJ SOLN
30.0000 mg | Freq: Once | INTRAMUSCULAR | Status: AC
  Administered 2014-09-10: 30 mg via INTRAMUSCULAR

## 2014-09-10 MED ORDER — NAPROXEN 500 MG PO TABS: 500.0000 mg | ORAL_TABLET | Freq: Two times a day (BID) | ORAL | Status: DC

## 2014-09-10 MED ORDER — KETOROLAC TROMETHAMINE 30 MG/ML IJ SOLN
INTRAMUSCULAR | Status: AC
  Filled 2014-09-10: qty 1

## 2014-09-10 NOTE — ED Notes (Addendum)
C/O HA, facial pain x 3 wks with worsening.  Describes pressure and dry, burning sensation over bilat maxillary & frontal sinuses - L>R.  Denies fevers or cough.  Has been taking Advil, OTC nasal spray and OTC sinus med without any relief.

## 2014-09-10 NOTE — ED Notes (Signed)
Upon entering room, pt states she is uncomfortable receiving prescribed injection because she feels she doesn't have a migraine.  Discussed that injection is for any type of pain and inflammation and should help her facial pain as well.  Requesting to speak with an MD.  Discussed with Dr. Juventino Slovak - he concurs with PA medical opinion.  Informed pt she is welcome to refuse injection and to seek medical opinion elsewhere.  After several questions and deliberation, pt finally states she will try injection.

## 2014-09-10 NOTE — ED Provider Notes (Signed)
CSN: [id number]     Arrival date & time 09/10/14  1054 History   First MD Initiated Contact with Patient 09/10/14 1135     Chief Complaint  Patient presents with  . Facial Pain   (Consider location/radiation/quality/duration/timing/severity/associated sxs/prior Treatment) HPI Comments: Patient presents to report 2-3 weeks of left sided facial pressure, bitemporal headache (L>R), nasal congestion and photophobia. Endorses that she suffers from chronic migraine headaches and review of old records confirms this. Patient has not taken any migraine rescue medication nor has she been compliant with daily migraine prophylactic medication recommended by her neurologist. Denies facial or head injury. Denies dental pain or dental issues.  Denies fever or post nasal drainage. No facial rashes or swelling. Has not been evaluated by PCP for current issue.   The history is provided by the patient.    Past Medical History  Diagnosis Date  . Osteoarthritis of hip   . History of migraine headaches     Typical symptoms - bright lights, unilateral (starting behind her left ear), throbbing .  Marland Kitchen Surgical menopause 08/2007    On HRT. Occuring since 01/09 following TAH and R-salpingo-oophorectomy   . Tibial fracture 10/2008    Left - Sustained 2/2 fall down stairs. Patient is S/P closed internal fixation with Smith Nephew tibial nail locked proximal and distal  . Endometriosis     S/P TAH and salpingo-oophorectomy, right  . Chronic cough   . Gastritis     Thought due to chronic NSAID use 2/2 pain from her congenital hip abnormality  . Epigastric pain     Chronic, thought 2/2 gastritis, EGD (07/14/2011) showing chronic duodenitis, H.pyloir neg // Repeat EGD (09/2011) - esophagealring, sessible polyp in stomach body, hiatal hernia - rec ad probiotic, await bx  . 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 With chronic OA of hip  . Vitamin D  deficiency 11/2009    Vit D level 15 in 11/2009  . Closed fibular fracture 10/2008    Left - Sustained 2/2 fall down stairs (same fall as tibular fracture). S/P internal fixation.  . Hidradenitis suppurativa 05/2006  . Protrusio acetabuli     diagnosed at age 41  . Ovarian cancer 1995  . Headache(784.0)     Migraines  . Duodenitis     EGD (07/14/2011) showing chronic duodenitis, H.pyloir neg  . Esophageal ring     s/p dilatation during EGD (07/2011) - Dr. Oneida Alar  . Anxiety     pt denies having dx of anxiety  . Sessile colonic polyp     noted 09/2011 -- > rec colonoscopy in 10 years, 2023  . Internal hemorrhoids   . Hypertension   . Bunion, right foot   . BCC (basal cell carcinoma of skin)     right foot    Past Surgical History  Procedure Laterality Date  . Cesarean section  08/2005    Primary low transverse  . Tubal ligation  08/2005    Right tubal ligation  . Laparoscopy  10/2000    Operative laparoscopy with lysis of right adnexal adhesions and uterointestinal adhesions  . Other surgical history  02/2009    Closed treatment internal fixation, left tibia with Tamala Julian and Nephew tibial nail locked proximal and distal  . Total abdominal hysterectomy w/ bilateral salpingoophorectomy  08/2007    for endometriosis. With concurrent right salpingo-oophorectomy  (2009), left oophorectomy (1995)  . Left tib fib    .  Joint replacement      right hip  . Fracture surgery      Right hip  . Colonoscopy  09/22/2011    UMP:NTIRWER polyp in the rectum/Internal hemorrhoids, benign path  . Esophagogastroduodenoscopy  09/22/2011    XVQ:MGQQ, esophageal/Sessile polyp in the body of the stomach/Hiatal hernia/ABDOMINAL PAIN LIKELY FUNCTIONAL V. POST-INFECTIOUS IBS, path: mild chronic gastritis  . Abdominal hysterectomy     Family History  Problem Relation Age of Onset  . Other Father     deceased while in service.   . Breast cancer Sister 16    remission  . Cancer Sister     breast  .  Colon cancer Neg Hx   . Liver disease Neg Hx   . Lupus Cousin    History  Substance Use Topics  . Smoking status: Never Smoker   . Smokeless tobacco: Not on file  . Alcohol Use: No   OB History    No data available     Review of Systems  Constitutional: Negative.   HENT: Positive for congestion and sinus pressure. Negative for ear discharge, ear pain, facial swelling, mouth sores, nosebleeds, postnasal drip, rhinorrhea, sneezing, sore throat, tinnitus, trouble swallowing and voice change.   Eyes: Positive for photophobia. Negative for pain, discharge, redness, itching and visual disturbance.  Respiratory: Negative.   Cardiovascular: Negative.   Gastrointestinal: Negative.   Genitourinary: Negative.   Musculoskeletal: Negative.   Skin: Negative.   Neurological: Positive for headaches. Negative for dizziness, tremors, seizures, syncope, facial asymmetry, speech difficulty, weakness, light-headedness and numbness.    Allergies  Pineapple; Morphine and related; and Tape  Home Medications   Prior to Admission medications   Medication Sig Start Date End Date Taking? Authorizing Provider  Linaclotide (LINZESS) 290 MCG CAPS capsule TAKE 1 CAPSULE BY MOUTH DAILY. TAKE 30 MINUTES BEFORE BREAKFAST DAILY 05/09/14  Yes Orvil Feil, NP  amoxicillin (AMOXIL) 500 MG capsule Take 1 capsule (500 mg total) by mouth 3 (three) times daily. 05/11/14   Harden Mo, MD  calcium carbonate (OS-CAL) 600 MG TABS tablet Take 1 tablet (600 mg total) by mouth 2 (two) times daily with a meal. 03/03/14   Cresenciano Genre, MD  Cholecalciferol 400 UNITS CAPS Take 2 capsules (800 Units total) by mouth daily. 04/25/14   Madilyn Fireman, MD  ESTROGENS CONJUGATED PO Take by mouth.    Historical Provider, MD  ipratropium (ATROVENT) 0.06 % nasal spray Place 2 sprays into both nostrils 4 (four) times daily. 09/10/14   Audelia Hives Rehaan Viloria, PA  lisinopril-hydrochlorothiazide (PRINZIDE) 10-12.5 MG per tablet Take 1 tablet  by mouth daily. 04/25/14 04/25/15  Madilyn Fireman, MD  naproxen (NAPROSYN) 500 MG tablet Take 1 tablet (500 mg total) by mouth 2 (two) times daily with a meal. As needed for pain 09/10/14   Audelia Hives Vondell Babers, PA  predniSONE (DELTASONE) 20 MG tablet Take 1 tablet (20 mg total) by mouth 2 (two) times daily. 05/11/14   Harden Mo, MD  traMADol (ULTRAM) 50 MG tablet Take 1 tablet (50 mg total) by mouth 2 (two) times daily as needed. 03/03/14   Cresenciano Genre, MD   BP 152/91 mmHg  Pulse 84  Temp(Src) 98 F (36.7 C) (Oral)  Resp 16  SpO2 100% Physical Exam  ED Course  Procedures (including critical care time) Labs Review Labs Reviewed - No data to display  Imaging Review No results found.   MDM   1. Chronic nonintractable headache, unspecified  headache type   2. Facial pain   3. Nasal congestion    Toradol 30 mg IM given at [facility] for pain. Please use medications as prescribed and I would ask you to consider using your migraine rescue medication (imitrex) as prescribed if symptoms do not improve. Your vital signs and exam do not suggest the presence of a sinus infection nor do you appear to need antibiotics. You should be aware that your blood pressure is elevated and you should follow up with your primary care doctor with regard to this in the next 7-10 days. If symptoms do not improve, please follow up with your primary care provider. Make sure you stay well hydrated. If symptoms become suddenly worse or severe, seek re-evaluation at your nearest Emergency Room.   Lutricia Feil, [place] 09/10/14 [phone]

## 2014-09-10 NOTE — Discharge Instructions (Signed)
Please use medications as prescribed and I would ask you to consider using your migraine rescue medication (imitrex) as prescribed if symptoms do not improve. Your vital signs and exam do not suggest the presence of a sinus infection nor do you appear to need antibiotics. You should be aware that your blood pressure is elevated and you should follow up with your primary care doctor with regard to this in the next 7-10 days. If symptoms do not improve, please follow up with your primary care provider. Make sure you stay well hydrated. If symptoms become suddenly worse or severe, seek re-evaluation at your nearest Emergency Room.  Headaches, Frequently Asked Questions MIGRAINE HEADACHES Q: What is migraine? What causes it? How can I treat it? A: Generally, migraine headaches begin as a dull ache. Then they develop into a constant, throbbing, and pulsating pain. You may experience pain at the temples. You may experience pain at the front or back of one or both sides of the head. The pain is usually accompanied by a combination of:  Nausea.  Vomiting.  Sensitivity to light and noise. Some people (about 15%) experience an aura (see below) before an attack. The cause of migraine is believed to be chemical reactions in the brain. Treatment for migraine may include over-the-counter or prescription medications. It may also include self-help techniques. These include relaxation training and biofeedback.  Q: What is an aura? A: About 15% of people with migraine get an "aura". This is a sign of neurological symptoms that occur before a migraine headache. You may see wavy or jagged lines, dots, or flashing lights. You might experience tunnel vision or blind spots in one or both eyes. The aura can include visual or auditory hallucinations (something imagined). It may include disruptions in smell (such as strange odors), taste or touch. Other symptoms include:  Numbness.  A "pins and needles"  sensation.  Difficulty in recalling or speaking the correct word. These neurological events may last as long as 60 minutes. These symptoms will fade as the headache begins. Q: What is a trigger? A: Certain physical or environmental factors can lead to or "trigger" a migraine. These include:  Foods.  Hormonal changes.  Weather.  Stress. It is important to remember that triggers are different for everyone. To help prevent migraine attacks, you need to figure out which triggers affect you. Keep a headache diary. This is a good way to track triggers. The diary will help you talk to your healthcare professional about your condition. Q: Does weather affect migraines? A: Bright sunshine, hot, humid conditions, and drastic changes in barometric pressure may lead to, or "trigger," a migraine attack in some people. But studies have shown that weather does not act as a trigger for everyone with migraines. Q: What is the link between migraine and hormones? A: Hormones start and regulate many of your body's functions. Hormones keep your body in balance within a constantly changing environment. The levels of hormones in your body are unbalanced at times. Examples are during menstruation, pregnancy, or menopause. That can lead to a migraine attack. In fact, about three quarters of all women with migraine report that their attacks are related to the menstrual cycle.  Q: Is there an increased risk of stroke for migraine sufferers? A: The likelihood of a migraine attack causing a stroke is very remote. That is not to say that migraine sufferers cannot have a stroke associated with their migraines. In persons under age 8, the most common associated factor for  stroke is migraine headache. But over the course of a person's normal life span, the occurrence of migraine headache may actually be associated with a reduced risk of dying from cerebrovascular disease due to stroke.  Q: What are acute medications for  migraine? A: Acute medications are used to treat the pain of the headache after it has started. Examples over-the-counter medications, NSAIDs, ergots, and triptans.  Q: What are the triptans? A: Triptans are the newest class of abortive medications. They are specifically targeted to treat migraine. Triptans are vasoconstrictors. They moderate some chemical reactions in the brain. The triptans work on receptors in your brain. Triptans help to restore the balance of a neurotransmitter called serotonin. Fluctuations in levels of serotonin are thought to be a main cause of migraine.  Q: Are over-the-counter medications for migraine effective? A: Over-the-counter, or "OTC," medications may be effective in relieving mild to moderate pain and associated symptoms of migraine. But you should see your caregiver before beginning any treatment regimen for migraine.  Q: What are preventive medications for migraine? A: Preventive medications for migraine are sometimes referred to as "prophylactic" treatments. They are used to reduce the frequency, severity, and length of migraine attacks. Examples of preventive medications include antiepileptic medications, antidepressants, beta-blockers, calcium channel blockers, and NSAIDs (nonsteroidal anti-inflammatory drugs). Q: Why are anticonvulsants used to treat migraine? A: During the past few years, there has been an increased interest in antiepileptic drugs for the prevention of migraine. They are sometimes referred to as "anticonvulsants". Both epilepsy and migraine may be caused by similar reactions in the brain.  Q: Why are antidepressants used to treat migraine? A: Antidepressants are typically used to treat people with depression. They may reduce migraine frequency by regulating chemical levels, such as serotonin, in the brain.  Q: What alternative therapies are used to treat migraine? A: The term "alternative therapies" is often used to describe treatments  considered outside the scope of conventional Western medicine. Examples of alternative therapy include acupuncture, acupressure, and yoga. Another common alternative treatment is herbal therapy. Some herbs are believed to relieve headache pain. Always discuss alternative therapies with your caregiver before proceeding. Some herbal products contain arsenic and other toxins. TENSION HEADACHES Q: What is a tension-type headache? What causes it? How can I treat it? A: Tension-type headaches occur randomly. They are often the result of temporary stress, anxiety, fatigue, or anger. Symptoms include soreness in your temples, a tightening band-like sensation around your head (a "vice-like" ache). Symptoms can also include a pulling feeling, pressure sensations, and contracting head and neck muscles. The headache begins in your forehead, temples, or the back of your head and neck. Treatment for tension-type headache may include over-the-counter or prescription medications. Treatment may also include self-help techniques such as relaxation training and biofeedback. CLUSTER HEADACHES Q: What is a cluster headache? What causes it? How can I treat it? A: Cluster headache gets its name because the attacks come in groups. The pain arrives with little, if any, warning. It is usually on one side of the head. A tearing or bloodshot eye and a runny nose on the same side of the headache may also accompany the pain. Cluster headaches are believed to be caused by chemical reactions in the brain. They have been described as the most severe and intense of any headache type. Treatment for cluster headache includes prescription medication and oxygen. SINUS HEADACHES Q: What is a sinus headache? What causes it? How can I treat it? A: When a cavity in  the bones of the face and skull (a sinus) becomes inflamed, the inflammation will cause localized pain. This condition is usually the result of an allergic reaction, a tumor, or an  infection. If your headache is caused by a sinus blockage, such as an infection, you will probably have a fever. An x-ray will confirm a sinus blockage. Your caregiver's treatment might include antibiotics for the infection, as well as antihistamines or decongestants.  REBOUND HEADACHES Q: What is a rebound headache? What causes it? How can I treat it? A: A pattern of taking acute headache medications too often can lead to a condition known as "rebound headache." A pattern of taking too much headache medication includes taking it more than 2 days per week or in excessive amounts. That means more than the label or a caregiver advises. With rebound headaches, your medications not only stop relieving pain, they actually begin to cause headaches. Doctors treat rebound headache by tapering the medication that is being overused. Sometimes your caregiver will gradually substitute a different type of treatment or medication. Stopping may be a challenge. Regularly overusing a medication increases the potential for serious side effects. Consult a caregiver if you regularly use headache medications more than 2 days per week or more than the label advises. ADDITIONAL QUESTIONS AND ANSWERS Q: What is biofeedback? A: Biofeedback is a self-help treatment. Biofeedback uses special equipment to monitor your body's involuntary physical responses. Biofeedback monitors:  Breathing.  Pulse.  Heart rate.  Temperature.  Muscle tension.  Brain activity. Biofeedback helps you refine and perfect your relaxation exercises. You learn to control the physical responses that are related to stress. Once the technique has been mastered, you do not need the equipment any more. Q: Are headaches hereditary? A: Four out of five (80%) of people that suffer report a family history of migraine. Scientists are not sure if this is genetic or a family predisposition. Despite the uncertainty, a child has a 50% chance of having migraine if  one parent suffers. The child has a 75% chance if both parents suffer.  Q: Can children get headaches? A: By the time they reach high school, most young people have experienced some type of headache. Many safe and effective approaches or medications can prevent a headache from occurring or stop it after it has begun.  Q: What type of doctor should I see to diagnose and treat my headache? A: Start with your primary caregiver. Discuss his or her experience and approach to headaches. Discuss methods of classification, diagnosis, and treatment. Your caregiver may decide to recommend you to a headache specialist, depending upon your symptoms or other physical conditions. Having diabetes, allergies, etc., may require a more comprehensive and inclusive approach to your headache. The National Headache Foundation will provide, upon request, a list of [facility] physician members in your state. Document Released: 10/18/2003 Document Revised: 10/20/2011 Document Reviewed: 03/27/2008 [facility] Patient Information 2015 [place], [place]. This information is not intended to replace advice given to you by your health care provider. Make sure you discuss any questions you have with your health care provider.  Migraine Headache A migraine headache is an intense, throbbing pain on one or both sides of your head. A migraine can last for 30 minutes to several hours. CAUSES  The exact cause of a migraine headache is not always known. However, a migraine may be caused when nerves in the brain become irritated and release chemicals that cause inflammation. This causes pain. Certain things may also trigger migraines,  such as:  Alcohol.  Smoking.  Stress.  Menstruation.  Aged cheeses.  Foods or drinks that contain nitrates, glutamate, aspartame, or tyramine.  Lack of sleep.  Chocolate.  Caffeine.  Hunger.  Physical exertion.  Fatigue.  Medicines used to treat chest pain (nitroglycerine), birth control pills,  estrogen, and some blood pressure medicines. SIGNS AND SYMPTOMS  Pain on one or both sides of your head.  Pulsating or throbbing pain.  Severe pain that prevents daily activities.  Pain that is aggravated by any physical activity.  Nausea, vomiting, or both.  Dizziness.  Pain with exposure to bright lights, loud noises, or activity.  General sensitivity to bright lights, loud noises, or smells. Before you get a migraine, you may get warning signs that a migraine is coming (aura). An aura may include:  Seeing flashing lights.  Seeing bright spots, halos, or zigzag lines.  Having tunnel vision or blurred vision.  Having feelings of numbness or tingling.  Having trouble talking.  Having muscle weakness. DIAGNOSIS  A migraine headache is often diagnosed based on:  Symptoms.  Physical exam.  A CT scan or MRI of your head. These imaging tests cannot diagnose migraines, but they can help rule out other causes of headaches. TREATMENT Medicines may be given for pain and nausea. Medicines can also be given to help prevent recurrent migraines.  HOME CARE INSTRUCTIONS  Only take over-the-counter or prescription medicines for pain or discomfort as directed by your health care provider. The use of long-term narcotics is not recommended.  Lie down in a dark, quiet room when you have a migraine.  Keep a journal to find out what may trigger your migraine headaches. For example, write down:  What you eat and drink.  How much sleep you get.  Any change to your diet or medicines.  Limit alcohol consumption.  Quit smoking if you smoke.  Get 7-9 hours of sleep, or as recommended by your health care provider.  Limit stress.  Keep lights dim if bright lights bother you and make your migraines worse. SEEK IMMEDIATE MEDICAL CARE IF:   Your migraine becomes severe.  You have a fever.  You have a stiff neck.  You have vision loss.  You have muscular weakness or loss of  muscle control.  You start losing your balance or have trouble walking.  You feel faint or pass out.  You have severe symptoms that are different from your first symptoms. MAKE SURE YOU:   Understand these instructions.  Will watch your condition.  Will get help right away if you are not doing well or get worse. Document Released: 07/28/2005 Document Revised: 12/12/2013 Document Reviewed: 04/04/2013 [facility] Patient Information 2015 [place], [place]. This information is not intended to replace advice given to you by your health care provider. Make sure you discuss any questions you have with your health care provider.  General Headache Without Cause A headache is pain or discomfort felt around the head or neck area. The specific cause of a headache may not be found. There are many causes and types of headaches. A few common ones are:  Tension headaches.  Migraine headaches.  Cluster headaches.  Chronic daily headaches. HOME CARE INSTRUCTIONS   Keep all follow-up appointments with your caregiver or any specialist referral.  Only take over-the-counter or prescription medicines for pain or discomfort as directed by your caregiver.  Lie down in a dark, quiet room when you have a headache.  Keep a headache journal to find out what  may trigger your migraine headaches. For example, write down:  What you eat and drink.  How much sleep you get.  Any change to your diet or medicines.  Try massage or other relaxation techniques.  Put ice packs or heat on the head and neck. Use these 3 to 4 times per day for 15 to 20 minutes each time, or as needed.  Limit stress.  Sit up straight, and do not tense your muscles.  Quit smoking if you smoke.  Limit alcohol use.  Decrease the amount of caffeine you drink, or stop drinking caffeine.  Eat and sleep on a regular schedule.  Get 7 to 9 hours of sleep, or as recommended by your caregiver.  Keep lights dim if bright lights  bother you and make your headaches worse. SEEK MEDICAL CARE IF:   You have problems with the medicines you were prescribed.  Your medicines are not working.  You have a change from the usual headache.  You have nausea or vomiting. SEEK IMMEDIATE MEDICAL CARE IF:   Your headache becomes severe.  You have a fever.  You have a stiff neck.  You have loss of vision.  You have muscular weakness or loss of muscle control.  You start losing your balance or have trouble walking.  You feel faint or pass out.  You have severe symptoms that are different from your first symptoms. MAKE SURE YOU:   Understand these instructions.  Will watch your condition.  Will get help right away if you are not doing well or get worse. Document Released: 07/28/2005 Document Revised: 10/20/2011 Document Reviewed: 08/13/2011 [facility] Patient Information 2015 [place], [place]. This information is not intended to replace advice given to you by your health care provider. Make sure you discuss any questions you have with your health care provider.

## 2014-10-06 DIAGNOSIS — R51 Headache: Secondary | ICD-10-CM | POA: Diagnosis not present

## 2014-10-28 ENCOUNTER — Encounter (HOSPITAL_COMMUNITY): Payer: Self-pay | Admitting: Emergency Medicine

## 2014-10-28 ENCOUNTER — Emergency Department (INDEPENDENT_AMBULATORY_CARE_PROVIDER_SITE_OTHER)
Admission: EM | Admit: 2014-10-28 | Discharge: 2014-10-28 | Disposition: A | Discharge status: Home / Self Care | Payer: BLUE CROSS/BLUE SHIELD | Source: Home / Self Care | Attending: Emergency Medicine | Admitting: Emergency Medicine

## 2014-10-28 DIAGNOSIS — J208 Acute bronchitis due to other specified organisms: Secondary | ICD-10-CM | POA: Diagnosis not present

## 2014-10-28 MED ORDER — IPRATROPIUM BROMIDE 0.06 % NA SOLN: 2.0000 | Freq: Four times a day (QID) | NASAL | Status: DC

## 2014-10-28 MED ORDER — ALBUTEROL SULFATE HFA 108 (90 BASE) MCG/ACT IN AERS: 2.0000 | INHALATION_SPRAY | RESPIRATORY_TRACT | Status: DC | PRN

## 2014-10-28 MED ORDER — PREDNISONE 50 MG PO TABS: ORAL_TABLET | ORAL | Status: DC

## 2014-10-28 MED ORDER — HYDROCODONE-HOMATROPINE 5-1.5 MG/5ML PO SYRP: 5.0000 mL | ORAL_SOLUTION | Freq: Four times a day (QID) | ORAL | Status: DC | PRN

## 2014-10-28 NOTE — ED Notes (Signed)
Pt states that she has had a cough for over 2 weeks. Pt is in no acute distress at this time.

## 2014-10-28 NOTE — ED Provider Notes (Signed)
CSN: [id number]     Arrival date & time 10/28/14  1341 History   First MD Initiated Contact with Patient 10/28/14 1459     Chief Complaint  Patient presents with  . Cough   (Consider location/radiation/quality/duration/timing/severity/associated sxs/prior Treatment) HPI  She is a 39 year old woman here for evaluation of cough. She states this is been going on for about 2 weeks and has gradually been getting worse. She reports some nasal congestion and throat rawness as well. She also describes chest tightness. She has some pain with coughing. She does report some chills. No fevers, body aches, headaches.  No nausea or vomiting. She is eating well.  She has been taking Mucinex and Alka-Seltzer without improvement. She thinks these medications have raised her blood pressure. She is taking her lisinopril.  Past Medical History  Diagnosis Date  . Osteoarthritis of hip   . History of migraine headaches     Typical symptoms - bright lights, unilateral (starting behind her left ear), throbbing .  Marland Kitchen Surgical menopause 08/2007    On HRT. Occuring since 01/09 following TAH and R-salpingo-oophorectomy   . Tibial fracture 10/2008    Left - Sustained 2/2 fall down stairs. Patient is S/P closed internal fixation with Smith Nephew tibial nail locked proximal and distal  . Endometriosis     S/P TAH and salpingo-oophorectomy, right  . Chronic cough   . Gastritis     Thought due to chronic NSAID use 2/2 pain from her congenital hip abnormality  . Epigastric pain     Chronic, thought 2/2 gastritis, EGD (07/14/2011) showing chronic duodenitis, H.pyloir neg // Repeat EGD (09/2011) - esophagealring, sessible polyp in stomach body, hiatal hernia - rec ad probiotic, await bx  . 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 With chronic OA of hip  . Vitamin D deficiency 11/2009    Vit D level 15 in 11/2009  . Closed fibular fracture 10/2008    Left  - Sustained 2/2 fall down stairs (same fall as tibular fracture). S/P internal fixation.  . Hidradenitis suppurativa 05/2006  . Protrusio acetabuli     diagnosed at age 10  . Ovarian cancer 1995  . Headache(784.0)     Migraines  . Duodenitis     EGD (07/14/2011) showing chronic duodenitis, H.pyloir neg  . Esophageal ring     s/p dilatation during EGD (07/2011) - Dr. Oneida Alar  . Anxiety     pt denies having dx of anxiety  . Sessile colonic polyp     noted 09/2011 -- > rec colonoscopy in 10 years, 2023  . Internal hemorrhoids   . Hypertension   . Bunion, right foot   . BCC (basal cell carcinoma of skin)     right foot    Past Surgical History  Procedure Laterality Date  . Cesarean section  08/2005    Primary low transverse  . Tubal ligation  08/2005    Right tubal ligation  . Laparoscopy  10/2000    Operative laparoscopy with lysis of right adnexal adhesions and uterointestinal adhesions  . Other surgical history  02/2009    Closed treatment internal fixation, left tibia with Tamala Julian and Nephew tibial nail locked proximal and distal  . Total abdominal hysterectomy w/ bilateral salpingoophorectomy  08/2007    for endometriosis. With concurrent right salpingo-oophorectomy  (2009), left oophorectomy (1995)  . Left tib fib    . Joint replacement      right  hip  . Fracture surgery      Right hip  . Colonoscopy  09/22/2011    IOX:BDZHGDJ polyp in the rectum/Internal hemorrhoids, benign path  . Esophagogastroduodenoscopy  09/22/2011    MEQ:ASTM, esophageal/Sessile polyp in the body of the stomach/Hiatal hernia/ABDOMINAL PAIN LIKELY FUNCTIONAL V. POST-INFECTIOUS IBS, path: mild chronic gastritis  . Abdominal hysterectomy     Family History  Problem Relation Age of Onset  . Other Father     deceased while in service.   . Breast cancer Sister 40    remission  . Cancer Sister     breast  . Colon cancer Neg Hx   . Liver disease Neg Hx   . Lupus Cousin    History  Substance Use  Topics  . Smoking status: Never Smoker   . Smokeless tobacco: Not on file  . Alcohol Use: No   OB History    No data available     Review of Systems  Constitutional: Positive for chills. Negative for fever and appetite change.  HENT: Positive for congestion and sore throat. Negative for rhinorrhea and trouble swallowing.   Respiratory: Positive for cough and chest tightness. Negative for shortness of breath.   Cardiovascular: Positive for chest pain (with cough).  Gastrointestinal: Negative for nausea and vomiting.  Musculoskeletal: Negative for myalgias.  Neurological: Negative for headaches.    Allergies  Pineapple; Morphine and related; and Tape  Home Medications   Prior to Admission medications   Medication Sig Start Date End Date Taking? Authorizing Provider  albuterol (PROVENTIL HFA;VENTOLIN HFA) 108 (90 BASE) MCG/ACT inhaler Inhale 2 puffs into the lungs every 4 (four) hours as needed for wheezing or shortness of breath. 10/28/14   Melony Overly, MD  amoxicillin (AMOXIL) 500 MG capsule Take 1 capsule (500 mg total) by mouth 3 (three) times daily. 05/11/14   Harden Mo, MD  calcium carbonate (OS-CAL) 600 MG TABS tablet Take 1 tablet (600 mg total) by mouth 2 (two) times daily with a meal. 03/03/14   Cresenciano Genre, MD  Cholecalciferol 400 UNITS CAPS Take 2 capsules (800 Units total) by mouth daily. 04/25/14   Madilyn Fireman, MD  ESTROGENS CONJUGATED PO Take by mouth.    Historical Provider, MD  HYDROcodone-homatropine (HYCODAN) 5-1.5 MG/5ML syrup Take 5 mLs by mouth every 6 (six) hours as needed for cough. 10/28/14   Melony Overly, MD  ipratropium (ATROVENT) 0.06 % nasal spray Place 2 sprays into both nostrils 4 (four) times daily. 10/28/14   Melony Overly, MD  Linaclotide (LINZESS) 290 MCG CAPS capsule TAKE 1 CAPSULE BY MOUTH DAILY. TAKE 30 MINUTES BEFORE BREAKFAST DAILY 05/09/14   Orvil Feil, NP  lisinopril-hydrochlorothiazide (PRINZIDE) 10-12.5 MG per tablet Take 1 tablet by  mouth daily. 04/25/14 04/25/15  Madilyn Fireman, MD  naproxen (NAPROSYN) 500 MG tablet Take 1 tablet (500 mg total) by mouth 2 (two) times daily with a meal. As needed for pain 09/10/14   Audelia Hives Presson, PA  predniSONE (DELTASONE) 50 MG tablet Take 1 pill daily for 5 days. 10/28/14   Melony Overly, MD  traMADol (ULTRAM) 50 MG tablet Take 1 tablet (50 mg total) by mouth 2 (two) times daily as needed. 03/03/14   Cresenciano Genre, MD   BP 181/106 mmHg  Pulse 65  Temp(Src) 98.1 F (36.7 C) (Oral)  Resp 18  SpO2 99% Physical Exam  Constitutional: She appears well-developed and well-nourished. No distress.  HENT:  Head: Normocephalic and  atraumatic.  Right Ear: External ear normal.  Left Ear: External ear normal.  Mouth/Throat: Posterior oropharyngeal erythema present. No oropharyngeal exudate.  Neck: Neck supple.  Cardiovascular: Normal rate, regular rhythm and normal heart sounds.   No murmur heard. Pulmonary/Chest: Effort normal and breath sounds normal. No respiratory distress. She has no wheezes. She has no rales. She exhibits tenderness (along bilateral sternal margins).  Lymphadenopathy:    She has no cervical adenopathy.  Vitals reviewed.   ED Course  Procedures (including critical care time) Labs Review Labs Reviewed - No data to display  Imaging Review No results found.   MDM   1. Viral bronchitis    We'll treat with prednisone, albuterol, Atrovent nasal spray. Hycodan cough syrup provided for cough. Follow-up if no improvement in 2-3 days.    Melony Overly, MD 10/28/14 [phone]

## 2014-10-28 NOTE — Discharge Instructions (Signed)
You have a viral bronchitis. Take prednisone daily for 5 days. Use the nasal spray 4 times a day for the next week. Use the albuterol inhaler every 4 hours as needed for the chest tightness. Use the Hycodan cough syrup at night. This has a narcotic in it so do not take it while driving. If you are not feeling better by Monday or Tuesday, please come back or see your regular doctor.

## 2014-11-01 ENCOUNTER — Ambulatory Visit: Payer: Medicare Other | Admitting: Internal Medicine

## 2014-11-02 ENCOUNTER — Encounter: Payer: Self-pay | Admitting: Internal Medicine

## 2014-11-02 ENCOUNTER — Ambulatory Visit (INDEPENDENT_AMBULATORY_CARE_PROVIDER_SITE_OTHER): Payer: BLUE CROSS/BLUE SHIELD | Admitting: Internal Medicine

## 2014-11-02 VITALS — BP 164/100 | HR 78 | Temp 97.9°F | Ht 71.0 in | Wt 182.8 lb

## 2014-11-02 DIAGNOSIS — I10 Essential (primary) hypertension: Secondary | ICD-10-CM | POA: Diagnosis not present

## 2014-11-02 DIAGNOSIS — T464X5A Adverse effect of angiotensin-converting-enzyme inhibitors, initial encounter: Secondary | ICD-10-CM | POA: Insufficient documentation

## 2014-11-02 DIAGNOSIS — R300 Dysuria: Secondary | ICD-10-CM

## 2014-11-02 DIAGNOSIS — R058 Other specified cough: Secondary | ICD-10-CM

## 2014-11-02 DIAGNOSIS — R05 Cough: Secondary | ICD-10-CM

## 2014-11-02 DIAGNOSIS — E559 Vitamin D deficiency, unspecified: Secondary | ICD-10-CM | POA: Diagnosis not present

## 2014-11-02 LAB — POCT URINALYSIS DIPSTICK
Bilirubin, UA: NEGATIVE
Glucose, UA: NEGATIVE
KETONES UA: NEGATIVE
Leukocytes, UA: NEGATIVE
Nitrite, UA: NEGATIVE
PROTEIN UA: NEGATIVE
SPEC GRAV UA: 1.01
UROBILINOGEN UA: 0.2
pH, UA: 7

## 2014-11-02 MED ORDER — HYDROCHLOROTHIAZIDE 12.5 MG PO TABS: 12.5000 mg | ORAL_TABLET | Freq: Every day | ORAL | Status: DC

## 2014-11-02 MED ORDER — NITROFURANTOIN MONOHYD MACRO 100 MG PO CAPS: 100.0000 mg | ORAL_CAPSULE | Freq: Two times a day (BID) | ORAL | Status: AC

## 2014-11-02 NOTE — Patient Instructions (Addendum)
General Instructions: Please stop taking Lisinopril I think this is causing your cough.  I only want you to take Hydrochlorothiazide right now for cough.  If your cough is not improved in a week or two please come back.  If you cough goes away I want you to come back in a month for a BP recheck.  Please take Macrobid 100mg  twice a day for 3 days for possible UTI.  Please bring your medicines with you each time you come to clinic.  Medicines may include prescription medications, over-the-counter medications, herbal remedies, eye drops, vitamins, or other pills.   Progress Toward Treatment Goals:  Treatment Goal 03/03/2014  Blood pressure unchanged    Self Care Goals & Plans:  Self Care Goal 11/02/2014  Manage my medications take my medicines as prescribed; bring my medications to every visit; refill my medications on time  Monitor my health -  Eat healthy foods eat more vegetables; eat foods that are low in salt; eat baked foods instead of fried foods  Be physically active find an activity I enjoy  Meeting treatment goals -    No flowsheet data found.   Care Management & Community Referrals:  Referral 03/03/2014  Referrals made for care management support none needed  Referrals made to community resources none

## 2014-11-02 NOTE — Assessment & Plan Note (Signed)
-   Complaints of Dysuria x 2-3 days, U/A with some trace Hgb (patient post hytorectomy). - Will check U/A with reflex and Rx 3 day course of macrobid.

## 2014-11-02 NOTE — Assessment & Plan Note (Signed)
BP Readings from Last 3 Encounters:  11/02/14 164/100  10/28/14 181/106  09/10/14 152/91    Lab Results  Component Value Date   NA 139 03/03/2014   K 4.1 03/03/2014   CREATININE 0.80 03/03/2014    Assessment: Blood pressure control: moderately elevated Progress toward BP goal:  deteriorated Comments: patient only taking lisinopril 10mg   Plan: Medications:  D/C lisinopril 10mg , resume HCTZ 12.5mg  Educational resources provided:   Self management tools provided:   Other plans: I suspect her cough is due to her ACEi and will give her a trial off the medication.  I asked that she restart HCTZ at 25mg  but she wants to start at a lower dose.  Will have her follow up in 1 month for BP check.

## 2014-11-02 NOTE — Assessment & Plan Note (Signed)
Patient reports that her cough started after she started taking Lisinopril.  I suspect this may be the cause and will try her off the medication.  Other potential causes would be post nasal drip (currently allergy season), GERD (has history of indegestion and hital hernia) but denies heartburn currently. - Trial off ACEi - If no better in 1-2 weeks can reeval for possible allergy induced or reflux associated.  - No need for CXR at this time.

## 2014-11-02 NOTE — Progress Notes (Signed)
[facility] Subjective:   Patient ID: [name] female   DOB: 1976/02/23 39 y.o.   MRN: [id number]  HPI: DianaMarlette R [name] is a 39 y.o. female with a PMH below who presents for complaint of cough which has been going on for 3 weeks. Of note she was seen at urgent care on 3/19 for complaint of cough and was diagnosed with a viral bronchitis.  She was treated with prednisone, albuterol, and atrovent nasal spray as well as hycodan cough syrup.  She reports the steroids helps wth swelling in nose and face but did not do much for cough.  She did not like the codeine cough syrup, made her feel a little funny.  She did try to albuterol inhaler but did not notice any improvement. She describes her cough as a dry cough, typically worse at night.  She does not have allergies other than pineapple and morphine. She does not smoke.   She does reports some stress incontience with her cough, in addition she notes some dysuria x 2-3 days as well as a change in smell of her urine.    Past Medical History  Diagnosis Date  . Osteoarthritis of hip   . History of migraine headaches     Typical symptoms - bright lights, unilateral (starting behind her left ear), throbbing .  [name] Surgical menopause 08/2007    On HRT. Occuring since 01/09 following TAH and R-salpingo-oophorectomy   . Tibial fracture 10/2008    Left - Sustained 2/2 fall down stairs. Patient is S/P closed internal fixation with Smith Nephew tibial nail locked proximal and distal  . Endometriosis     S/P TAH and salpingo-oophorectomy, right  . Chronic cough   . Gastritis     Thought due to chronic NSAID use 2/2 pain from her congenital hip abnormality  . Epigastric pain     Chronic, thought 2/2 gastritis, EGD (07/14/2011) showing chronic duodenitis, H.pyloir neg // Repeat EGD (09/2011) - esophagealring, sessible polyp in stomach body, hiatal hernia - rec ad probiotic, await bx  . 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 With chronic OA of hip  . Vitamin D deficiency 11/2009    Vit D level 15 in 11/2009  . Closed fibular fracture 10/2008    Left - Sustained 2/2 fall down stairs (same fall as tibular fracture). S/P internal fixation.  . Hidradenitis suppurativa 05/2006  . Protrusio acetabuli     diagnosed at age 89  . Ovarian cancer 1995  . Headache(784.0)     Migraines  . Duodenitis     EGD (07/14/2011) showing chronic duodenitis, H.pyloir neg  . Esophageal ring     s/p dilatation during EGD (07/2011) - Dr. Oneida Alar  . Anxiety     pt denies having dx of anxiety  . Sessile colonic polyp     noted 09/2011 -- > rec colonoscopy in 10 years, 2023  . Internal hemorrhoids   . Hypertension   . Bunion, right foot   . BCC (basal cell carcinoma of skin)     right foot    Current Outpatient Prescriptions  Medication Sig Dispense Refill  . albuterol (PROVENTIL HFA;VENTOLIN HFA) 108 (90 BASE) MCG/ACT inhaler Inhale 2 puffs into the lungs every 4 (four) hours as needed for wheezing or shortness of breath. 1 Inhaler 0  . ESTROGENS CONJUGATED PO Take by mouth.    [name] ipratropium (ATROVENT) 0.06 % nasal spray Place  2 sprays into both nostrils 4 (four) times daily. 15 mL 0  . Linaclotide (LINZESS) 290 MCG CAPS capsule TAKE 1 CAPSULE BY MOUTH DAILY. TAKE 30 MINUTES BEFORE BREAKFAST DAILY 30 capsule 11  . lisinopril (PRINIVIL,ZESTRIL) 10 MG tablet Take 10 mg by mouth daily.    . calcium carbonate (OS-CAL) 600 MG TABS tablet Take 1 tablet (600 mg total) by mouth 2 (two) times daily with a meal. (Patient not taking: Reported on 11/02/2014) 60 tablet 1  . Cholecalciferol 400 UNITS CAPS Take 2 capsules (800 Units total) by mouth daily. (Patient not taking: Reported on 11/02/2014) 60 each 11  . hydrochlorothiazide (HYDRODIURIL) 12.5 MG tablet Take 1 tablet (12.5 mg total) by mouth daily. 30 tablet 1  . lisinopril-hydrochlorothiazide (PRINZIDE) 10-12.5 MG  per tablet Take 1 tablet by mouth daily. (Patient not taking: Reported on 11/02/2014) 30 tablet 11   No current facility-administered medications for this visit.   Family History  Problem Relation Age of Onset  . Other Father     deceased while in service.   . Breast cancer Sister 45    remission  . Cancer Sister     breast  . Colon cancer Neg Hx   . Liver disease Neg Hx   . Lupus Cousin    History   Social History  . Marital Status: Married    Spouse Name: N/A  . Number of Children: 2  . Years of Education: bs degree   Occupational History  . Stay-at-home mom    Social History Main Topics  . Smoking status: Never Smoker   . Smokeless tobacco: Not on file  . Alcohol Use: No  . Drug Use: No  . Sexual Activity: Not on file   Other Topics Concern  . None   Social History Narrative   Insurance: Medicare.   Patient is married with two children, [name] and [name].          Review of Systems: Review of Systems  Constitutional: Positive for chills. Negative for fever and weight loss.  Eyes: Positive for blurred vision.  Respiratory: Positive for cough. Negative for hemoptysis.   Cardiovascular: Positive for chest pain (associated with cough). Negative for leg swelling.  Gastrointestinal: Positive for abdominal pain. Negative for nausea and vomiting.  Genitourinary: Positive for dysuria.  Musculoskeletal: Negative for myalgias.  Skin: Negative for rash.  Neurological: Positive for headaches.     Objective:  Physical Exam: Filed Vitals:   11/02/14 1516  BP: 164/100  Pulse: 78  Temp: 97.9 F (36.6 C)  TempSrc: Oral  Height: 5\' 11"  (1.803 m)  Weight: 182 lb 12.8 oz (82.918 kg)  SpO2: 100%   Physical Exam  Constitutional: She is well-developed, well-nourished, and in no distress.  Cardiovascular: Normal rate, regular rhythm and normal heart sounds.   Pulmonary/Chest: Effort normal and breath sounds normal. She has no wheezes.  Abdominal: Soft. Bowel sounds  are normal. There is tenderness (mild suprapubic).  Musculoskeletal: She exhibits no edema.  Skin: Skin is warm and dry.  Nursing note and vitals reviewed.    Assessment & Plan:  Case discussed with Dr. Eppie Gibson  Hypertension BP Readings from Last 3 Encounters:  11/02/14 164/100  10/28/14 181/106  09/10/14 152/91    Lab Results  Component Value Date   NA 139 03/03/2014   K 4.1 03/03/2014   CREATININE 0.80 03/03/2014    Assessment: Blood pressure control: moderately elevated Progress toward BP goal:  deteriorated Comments: patient only taking lisinopril 10mg   Plan: Medications:  D/C lisinopril 10mg , resume HCTZ 12.5mg  Educational resources provided:   Self management tools provided:   Other plans: I suspect her cough is due to her ACEi and will give her a trial off the medication.  I asked that she restart HCTZ at 25mg  but she wants to start at a lower dose.  Will have her follow up in 1 month for BP check.    Cough due to ACE inhibitor Patient reports that her cough started after she started taking Lisinopril.  I suspect this may be the cause and will try her off the medication.  Other potential causes would be post nasal drip (currently allergy season), GERD (has history of indegestion and hital hernia) but denies heartburn currently. - Trial off ACEi - If no better in 1-2 weeks can reeval for possible allergy induced or reflux associated.  - No need for CXR at this time.   Dysuria - Complaints of Dysuria x 2-3 days, U/A with some trace Hgb (patient post hytorectomy). - Will check U/A with reflex and Rx 3 day course of macrobid.     Medications Ordered Meds ordered this encounter  Medications  . lisinopril (PRINIVIL,ZESTRIL) 10 MG tablet    Sig: Take 10 mg by mouth daily.  [name] DISCONTD: hydrochlorothiazide (MICROZIDE) 12.5 MG capsule    Sig: Take 12.5 mg by mouth daily.  . hydrochlorothiazide (HYDRODIURIL) 12.5 MG tablet    Sig: Take 1 tablet (12.5 mg total) by  mouth daily.    Dispense:  30 tablet    Refill:  1    Please discontinue Lisinopril   Other Orders Orders Placed This Encounter  Procedures  . Urinalysis, Reflex Microscopic  . POCT Urinalysis Dipstick (81002)

## 2014-11-03 LAB — URINALYSIS, ROUTINE W REFLEX MICROSCOPIC
Bilirubin Urine: NEGATIVE
Glucose, UA: NEGATIVE mg/dL
HGB URINE DIPSTICK: NEGATIVE
Ketones, ur: NEGATIVE mg/dL
LEUKOCYTES UA: NEGATIVE
Nitrite: NEGATIVE
PROTEIN: NEGATIVE mg/dL
Specific Gravity, Urine: 1.013 (ref 1.005–1.030)
UROBILINOGEN UA: 0.2 mg/dL (ref 0.0–1.0)
pH: 6.5 (ref 5.0–8.0)

## 2014-11-03 NOTE — Addendum Note (Signed)
Addended by: Oval Linsey D on: 11/03/2014 03:51 PM   Modules accepted: Level of Service

## 2014-11-03 NOTE — Progress Notes (Signed)
Case discussed with Dr. Hoffman at the time of the visit.  We reviewed the resident's history and exam and pertinent patient test results.  I agree with the assessment, diagnosis, and plan of care documented in the resident's note. 

## 2014-11-22 DIAGNOSIS — Z01419 Encounter for gynecological examination (general) (routine) without abnormal findings: Secondary | ICD-10-CM | POA: Diagnosis not present

## 2014-11-22 DIAGNOSIS — Z90710 Acquired absence of both cervix and uterus: Secondary | ICD-10-CM | POA: Diagnosis not present

## 2014-11-22 DIAGNOSIS — Z1272 Encounter for screening for malignant neoplasm of vagina: Secondary | ICD-10-CM | POA: Diagnosis not present

## 2014-11-22 DIAGNOSIS — N951 Menopausal and female climacteric states: Secondary | ICD-10-CM | POA: Diagnosis not present

## 2014-11-29 ENCOUNTER — Other Ambulatory Visit: Payer: Self-pay | Admitting: *Deleted

## 2014-11-29 NOTE — Telephone Encounter (Signed)
I called [facility] and they do not have the last Rx for Albuterol.    Can you resend for this pt ?

## 2014-11-30 MED ORDER — ALBUTEROL SULFATE HFA 108 (90 BASE) MCG/ACT IN AERS: 2.0000 | INHALATION_SPRAY | RESPIRATORY_TRACT | Status: DC | PRN

## 2014-11-30 NOTE — Telephone Encounter (Signed)
Called in.

## 2014-12-07 ENCOUNTER — Other Ambulatory Visit: Payer: Self-pay | Admitting: Internal Medicine

## 2014-12-13 DIAGNOSIS — Z969 Presence of functional implant, unspecified: Secondary | ICD-10-CM | POA: Diagnosis not present

## 2014-12-13 DIAGNOSIS — Z471 Aftercare following joint replacement surgery: Secondary | ICD-10-CM | POA: Diagnosis not present

## 2014-12-13 DIAGNOSIS — Z96643 Presence of artificial hip joint, bilateral: Secondary | ICD-10-CM | POA: Diagnosis not present

## 2014-12-13 DIAGNOSIS — Z96641 Presence of right artificial hip joint: Secondary | ICD-10-CM | POA: Diagnosis not present

## 2014-12-13 DIAGNOSIS — Z96642 Presence of left artificial hip joint: Secondary | ICD-10-CM | POA: Diagnosis not present

## 2014-12-13 DIAGNOSIS — Z9660 Presence of unspecified orthopedic joint implant: Secondary | ICD-10-CM | POA: Diagnosis not present

## 2014-12-19 DIAGNOSIS — Z885 Allergy status to narcotic agent status: Secondary | ICD-10-CM | POA: Diagnosis not present

## 2014-12-19 DIAGNOSIS — Z79899 Other long term (current) drug therapy: Secondary | ICD-10-CM | POA: Diagnosis not present

## 2014-12-19 DIAGNOSIS — R413 Other amnesia: Secondary | ICD-10-CM | POA: Diagnosis not present

## 2014-12-19 DIAGNOSIS — G43709 Chronic migraine without aura, not intractable, without status migrainosus: Secondary | ICD-10-CM | POA: Diagnosis not present

## 2014-12-19 DIAGNOSIS — G4709 Other insomnia: Secondary | ICD-10-CM | POA: Diagnosis not present

## 2015-01-23 ENCOUNTER — Telehealth: Payer: Self-pay | Admitting: *Deleted

## 2015-01-23 NOTE — Telephone Encounter (Signed)
Pt called stating she would like to come off Lisinopril.  A friend of hers sent her some information about the drug and it scared her. She side she was told it can cause Kidney problems, falls and it was the worse BP medication. She has been on it for a few months and has had some falls and back aches.  Will you consider a change? Pt # [id number]

## 2015-01-24 NOTE — Telephone Encounter (Signed)
Your next appointment is not until the end of July. I scheduled pt for a OV with Dr Ronnald Ramp for next week, Tuesday. I believe she has stopped talking the lisinopril.

## 2015-01-24 NOTE — Telephone Encounter (Signed)
The patient's last visit is 11/02/14. This is not a trivial question. She needs to come in for a visit and discuss the options she has, along with the risks associated with the medications.

## 2015-01-25 NOTE — Telephone Encounter (Signed)
I agree with appointment with Dr Ronnald Ramp. Thanks.

## 2015-01-29 ENCOUNTER — Telehealth: Payer: Self-pay | Admitting: Internal Medicine

## 2015-01-29 NOTE — Telephone Encounter (Signed)
Call to patient to confirm appointment for 01/30/15 at 10:45 lmtcb

## 2015-01-30 ENCOUNTER — Ambulatory Visit (INDEPENDENT_AMBULATORY_CARE_PROVIDER_SITE_OTHER): Payer: BLUE CROSS/BLUE SHIELD | Admitting: Internal Medicine

## 2015-01-30 ENCOUNTER — Encounter: Payer: Self-pay | Admitting: Internal Medicine

## 2015-01-30 VITALS — BP 162/91 | HR 63 | Temp 98.0°F | Ht 71.0 in | Wt 181.2 lb

## 2015-01-30 DIAGNOSIS — I10 Essential (primary) hypertension: Secondary | ICD-10-CM

## 2015-01-30 DIAGNOSIS — Z8719 Personal history of other diseases of the digestive system: Secondary | ICD-10-CM

## 2015-01-30 DIAGNOSIS — R1013 Epigastric pain: Secondary | ICD-10-CM

## 2015-01-30 DIAGNOSIS — R109 Unspecified abdominal pain: Secondary | ICD-10-CM | POA: Insufficient documentation

## 2015-01-30 LAB — COMPLETE METABOLIC PANEL WITH GFR
ALK PHOS: 53 U/L (ref 39–117)
ALT: 12 U/L (ref 0–35)
AST: 16 U/L (ref 0–37)
Albumin: 3.9 g/dL (ref 3.5–5.2)
BILIRUBIN TOTAL: 0.8 mg/dL (ref 0.2–1.2)
BUN: 8 mg/dL (ref 6–23)
CO2: 26 meq/L (ref 19–32)
Calcium: 9.5 mg/dL (ref 8.4–10.5)
Chloride: 105 mEq/L (ref 96–112)
Creat: 0.76 mg/dL (ref 0.50–1.10)
GFR, Est African American: >89 mL/min
GLUCOSE: 95 mg/dL (ref 70–99)
POTASSIUM: 3.7 meq/L (ref 3.5–5.3)
Sodium: 142 mEq/L (ref 135–145)
TOTAL PROTEIN: 7.2 g/dL (ref 6.0–8.3)

## 2015-01-30 MED ORDER — ONDANSETRON HCL 4 MG PO TABS: 4.0000 mg | ORAL_TABLET | Freq: Three times a day (TID) | ORAL | Status: DC | PRN

## 2015-01-30 MED ORDER — AMLODIPINE BESYLATE 5 MG PO TABS
5.0000 mg | ORAL_TABLET | Freq: Every day | ORAL | Status: DC
Start: 2015-01-30 — End: 2015-12-03

## 2015-01-30 MED ORDER — RANITIDINE HCL 150 MG PO TABS: 150.0000 mg | ORAL_TABLET | Freq: Two times a day (BID) | ORAL | Status: DC

## 2015-01-30 NOTE — Assessment & Plan Note (Addendum)
BP Readings from Last 3 Encounters:  01/30/15 162/91  11/02/14 164/100  10/28/14 181/106    Lab Results  Component Value Date   NA 139 03/03/2014   K 4.1 03/03/2014   CREATININE 0.80 03/03/2014    Assessment: Blood pressure control: moderately elevated Progress toward BP goal:  deteriorated Comments: patient concerned about ACEI, stopped taking it because she heard it has adverse effects. Patient was taken off of lisinopril at one time b/c of chronic cough, however, this was restarted for an unknown reason. Had also been on HCTZ, however, she states this caused dizziness and sun sensitivity.   Plan: Medications:  Start Norvasc 5 mg daily. Will likely need 10 mg qd.  Educational resources provided: brochure Self management tools provided:   Other plans: RTC in 2 weeks for further titration of HTN medications. Could also consider addition of ARB in the future if necessary. Patient was asking about starting Propranolol per discussion w/ her neurologist for migraine ppx, however, borderline bradycardic today, would not advise this at this time.

## 2015-01-30 NOTE — Progress Notes (Signed)
Subjective:   Patient ID: [name] female   DOB: 1975-10-21 39 y.o.   MRN: [id number]  HPI: Ms. [name] is a 39 y.o. female w/ PMHx of HTN, Migraine Headaches, gastritis, OA, and anxiety, presents to the clinic today for an acute visit for acute abdominal pain and concerns about her blood pressure medication. Patient has a h/o chronic abdominal pain, however, today she states she has had some sharp epigastric pain, w/ some associated nausea for the past few days. She also admits to some recent loose stools, however, she claims this has subsided. No fever, chills, or vomiting. States the pain is intermittent, not associated w/ food. No recent sick contacts, travel, or abnormal foods.   [name] is also concerned about her Lisinopril. She says that her friend told her the medication is dangerous and could hurt her kidneys. Patient had recently had this medication stopped d/t chronic cough, thought to be related to ACEI, however, this was again restarted for an unknown reason. Patient had also been changed to HCTZ at that time, but says it made her dizzy and sensitive to the sun. She has not taken this medication for about a week and has not had issues w/ dizziness.    Current Outpatient Prescriptions  Medication Sig Dispense Refill  . albuterol (PROVENTIL HFA;VENTOLIN HFA) 108 (90 BASE) MCG/ACT inhaler Inhale 2 puffs into the lungs every 4 (four) hours as needed for wheezing or shortness of breath. 1 Inhaler 0  . amLODipine (NORVASC) 5 MG tablet Take 1 tablet (5 mg total) by mouth daily. 30 tablet 11  . calcium carbonate (OS-CAL) 600 MG TABS tablet Take 1 tablet (600 mg total) by mouth 2 (two) times daily with a meal. (Patient not taking: Reported on 11/02/2014) 60 tablet 1  . Cholecalciferol 400 UNITS CAPS Take 2 capsules (800 Units total) by mouth daily. (Patient not taking: Reported on 11/02/2014) 60 each 11  . ESTROGENS CONJUGATED PO Take by mouth.    Marland Kitchen ipratropium  (ATROVENT) 0.06 % nasal spray Place 2 sprays into both nostrils 4 (four) times daily. 15 mL 0  . Linaclotide (LINZESS) 290 MCG CAPS capsule TAKE 1 CAPSULE BY MOUTH DAILY. TAKE 30 MINUTES BEFORE BREAKFAST DAILY 30 capsule 11  . ondansetron (ZOFRAN) 4 MG tablet Take 1 tablet (4 mg total) by mouth every 8 (eight) hours as needed for nausea or vomiting. 20 tablet 0  . ranitidine (ZANTAC) 150 MG tablet Take 1 tablet (150 mg total) by mouth 2 (two) times daily. 60 tablet 2   No current facility-administered medications for this visit.   Review of Systems  General: Denies fever, diaphoresis, appetite change, and fatigue.  Respiratory: Denies SOB, cough, and wheezing.   Cardiovascular: Denies chest pain and palpitations.  Gastrointestinal: Positive for nausea and abdominal pain. Denies vomiting and diarrhea Musculoskeletal: Denies myalgias, arthralgias, back pain, and gait problem.  Neurological: Denies dizziness, syncope, weakness, lightheadedness, and headaches.  Psychiatric/Behavioral: Denies mood changes, sleep disturbance, and agitation.   Objective:   Physical Exam: Filed Vitals:   01/30/15 1026  BP: 162/91  Pulse: 63  Temp: 98 F (36.7 C)  TempSrc: Oral  Height: 5\' 11"  (1.803 m)  Weight: 181 lb 3.2 oz (82.192 kg)  SpO2: 100%    General: AA female, alert, cooperative, NAD. HEENT: PERRL, EOMI. Moist mucus membranes Neck: Full range of motion without pain, supple, no lymphadenopathy or carotid bruits Lungs: Clear to ascultation bilaterally, normal work of respiration, no wheezes, rales, rhonchi  Heart: RRR, no murmurs, gallops, or rubs Abdomen: Soft, non-tender, non-distended, BS + Extremities: No cyanosis, clubbing, or edema Neurologic: Alert & oriented X3, cranial nerves II-XII intact, strength grossly intact, sensation intact to light touch   Assessment & Plan:   Please see problem based assessment and plan.

## 2015-01-30 NOTE — Assessment & Plan Note (Signed)
Epigastric pain most likely associated w/ previous h/o gastritis vs a viral gastroenteritis. Abdominal exam benign. Other possibilities include cholecystitis, however, no pain over RUQ. Patient denies h/o alcohol abuse, no previous h/o cholelithiasis. Pain relatively mild and intermittent, do not suspect pancreatitis.  -Start Ranitidine 150 mg bid -Check CMP to rule out gall bladder disease -Zofran prn for nausea

## 2015-01-30 NOTE — Patient Instructions (Signed)
General Instructions:  1. Schedule a follow up appointment in 2 weeks.   2. Please take all medications as previously prescribed with the following changes:  STOP LISINOPRIL. Start taking Norvasc (amlodipine) 5 mg daily.   Start taking Zantac 150 mg twice daily for abdominal pain and reflux symptoms.   Take Zofran 4 mg every 8 hours ONLY AS NEEDED for nausea.   3. If you have worsening of your symptoms or new symptoms arise, please call the clinic ([phone]), or go to the ER immediately if symptoms are severe.  Please bring your medicines with you each time you come to clinic.  Medicines may include prescription medications, over-the-counter medications, herbal remedies, eye drops, vitamins, or other pills.   Progress Toward Treatment Goals:  Treatment Goal 01/30/2015  Blood pressure deteriorated  Prevent falls at goal    Self Care Goals & Plans:  Self Care Goal 01/30/2015  Manage my medications take my medicines as prescribed; bring my medications to every visit; refill my medications on time  Monitor my health -  Eat healthy foods drink diet soda or water instead of juice or soda; eat more vegetables; eat foods that are low in salt; eat baked foods instead of fried foods; eat fruit for snacks and desserts  Be physically active -  Meeting treatment goals maintain the current self-care plan    Care Management & Community Referrals:  Referral 01/30/2015  Referrals made for care management support -  Referrals made to community resources none

## 2015-01-31 NOTE — Progress Notes (Signed)
INTERNAL MEDICINE TEACHING ATTENDING ADDENDUM - Kimiko Common, MD: I reviewed and discussed at the time of visit with the resident Dr. Jones, the patient's medical history, physical examination, diagnosis and results of pertinent tests and treatment and I agree with the patient's care as documented.  

## 2015-02-02 ENCOUNTER — Telehealth: Payer: Self-pay | Admitting: Internal Medicine

## 2015-02-02 NOTE — Telephone Encounter (Signed)
Patient called and having bad migrane headaches and states her bp is 188/90.

## 2015-02-02 NOTE — Telephone Encounter (Signed)
Talked to pt -  states has taken all meds today including BP med. Suggest for pt to go to ER or th Urgent Care - it is 4PM - no appts left in clinic for today. Hilda Blades Sonika Levins RN 02/02/15 4PM

## 2015-02-03 DIAGNOSIS — G43709 Chronic migraine without aura, not intractable, without status migrainosus: Secondary | ICD-10-CM | POA: Diagnosis not present

## 2015-02-08 ENCOUNTER — Telehealth: Payer: Self-pay | Admitting: Internal Medicine

## 2015-02-08 NOTE — Telephone Encounter (Signed)
Talked with pt BP since last appt 188/109. Having problems with vision - suggest for pt to go to ER. Pt has someone to drive her to ER.  Hilda Blades Kiylee Thoreson RN 02/08/15 4:20PM

## 2015-02-08 NOTE — Telephone Encounter (Signed)
Patient is calling stating her blood pressure was still running high 166/90.

## 2015-02-13 ENCOUNTER — Ambulatory Visit (INDEPENDENT_AMBULATORY_CARE_PROVIDER_SITE_OTHER): Payer: BLUE CROSS/BLUE SHIELD | Admitting: Internal Medicine

## 2015-02-13 ENCOUNTER — Encounter: Payer: Self-pay | Admitting: Internal Medicine

## 2015-02-13 VITALS — BP 132/87 | HR 66 | Temp 98.1°F | Ht 71.0 in | Wt 181.8 lb

## 2015-02-13 DIAGNOSIS — K2950 Unspecified chronic gastritis without bleeding: Secondary | ICD-10-CM | POA: Diagnosis not present

## 2015-02-13 DIAGNOSIS — I10 Essential (primary) hypertension: Secondary | ICD-10-CM | POA: Diagnosis not present

## 2015-02-13 DIAGNOSIS — K5900 Constipation, unspecified: Secondary | ICD-10-CM

## 2015-02-13 NOTE — Patient Instructions (Addendum)
Thank you for your visit today  Please continue the current dose of amlodipine 5mg  Please continue your Vitamin D medicine Please talk about mammogram at your next visit For your constipation, please use metamucil which is available over the counter, colase, and Miralax as needed Please STOP the Linzess  General Instructions:   Please bring your medicines with you each time you come to clinic.  Medicines may include prescription medications, over-the-counter medications, herbal remedies, eye drops, vitamins, or other pills.   Progress Toward Treatment Goals:  Treatment Goal 02/13/2015  Blood pressure improved  Prevent falls -    Self Care Goals & Plans:  Self Care Goal 02/13/2015  Manage my medications take my medicines as prescribed; bring my medications to every visit; refill my medications on time  Monitor my health -  Eat healthy foods drink diet soda or water instead of juice or soda; eat more vegetables; eat foods that are low in salt  Be physically active -  Meeting treatment goals maintain the current self-care plan    No flowsheet data found.   Care Management & Community Referrals:  Referral 01/30/2015  Referrals made for care management support -  Referrals made to community resources none          Treatment Goals:Hypertension Hypertension, commonly called high blood pressure, is when the force of blood pumping through your arteries is too strong. Your arteries are the blood vessels that carry blood from your heart throughout your body. A blood pressure reading consists of a higher number over a lower number, such as 110/72. The higher number (systolic) is the pressure inside your arteries when your heart pumps. The lower number (diastolic) is the pressure inside your arteries when your heart relaxes. Ideally you want your blood pressure below 120/80. Hypertension forces your heart to work harder to pump blood. Your arteries may become narrow or stiff. Having  hypertension puts you at risk for heart disease, stroke, and other problems.  RISK FACTORS Some risk factors for high blood pressure are controllable. Others are not.  Risk factors you cannot control include:   Race. You may be at higher risk if you are African American.  Age. Risk increases with age.  Gender. Men are at higher risk than women before age 59 years. After age 77, women are at higher risk than men. Risk factors you can control include:  Not getting enough exercise or physical activity.  Being overweight.  Getting too much fat, sugar, calories, or salt in your diet.  Drinking too much alcohol. SIGNS AND SYMPTOMS Hypertension does not usually cause signs or symptoms. Extremely high blood pressure (hypertensive crisis) may cause headache, anxiety, shortness of breath, and nosebleed. DIAGNOSIS  To check if you have hypertension, your health care provider will measure your blood pressure while you are seated, with your arm held at the level of your heart. It should be measured at least twice using the same arm. Certain conditions can cause a difference in blood pressure between your right and left arms. A blood pressure reading that is higher than normal on one occasion does not mean that you need treatment. If one blood pressure reading is high, ask your health care provider about having it checked again. TREATMENT  Treating high blood pressure includes making lifestyle changes and possibly taking medicine. Living a healthy lifestyle can help lower high blood pressure. You may need to change some of your habits. Lifestyle changes may include:  Following the DASH diet. This diet is  high in fruits, vegetables, and whole grains. It is low in salt, red meat, and added sugars.  Getting at least 2 hours of brisk physical activity every week.  Losing weight if necessary.  Not smoking.  Limiting alcoholic beverages.  Learning ways to reduce stress. If lifestyle changes are  not enough to get your blood pressure under control, your health care provider may prescribe medicine. You may need to take more than one. Work closely with your health care provider to understand the risks and benefits. HOME CARE INSTRUCTIONS  Have your blood pressure rechecked as directed by your health care provider.   Take medicines only as directed by your health care provider. Follow the directions carefully. Blood pressure medicines must be taken as prescribed. The medicine does not work as well when you skip doses. Skipping doses also puts you at risk for problems.   Do not smoke.   Monitor your blood pressure at home as directed by your health care provider. SEEK MEDICAL CARE IF:   You think you are having a reaction to medicines taken.  You have recurrent headaches or feel dizzy.  You have swelling in your ankles.  You have trouble with your vision. SEEK IMMEDIATE MEDICAL CARE IF:  You develop a severe headache or confusion.  You have unusual weakness, numbness, or feel faint.  You have severe chest or abdominal pain.  You vomit repeatedly.  You have trouble breathing. MAKE SURE YOU:   Understand these instructions.  Will watch your condition.  Will get help right away if you are not doing well or get worse. Document Released: 07/28/2005 Document Revised: 12/12/2013 Document Reviewed: 05/20/2013 [facility] Patient Information 2015 [place], [place]. This information is not intended to replace advice given to you by your health care provider. Make sure you discuss any questions you have with your health care provider.

## 2015-02-13 NOTE — Assessment & Plan Note (Signed)
Assessment: Constipation is intermittent, despite being on Linzess for 3 years. She acknowledges having irregular diet pattern,. She also mentioned that her headaches worsened after Linzess was started and that she experienced diarrhea. She was not taking Linzess in the last 3 weeks during the episode of abdominal pain. This fits in with IBS pattern.  Plan: I recommended her to stop Linzess. I recommended her to have healthy high fiber diet, and to use metamucil OTC. Also to use OTC colase, and miralax PRN and see if this improves it or not

## 2015-02-13 NOTE — Assessment & Plan Note (Signed)
BP Readings from Last 3 Encounters:  02/13/15 132/87  01/30/15 162/91  11/02/14 164/100    Lab Results  Component Value Date   NA 142 01/30/2015   K 3.7 01/30/2015   CREATININE 0.76 01/30/2015    Assessment: Blood pressure control: well controlled Progress toward BP goal:  improved Comments: Her hypertension is very much improved after being started on 5 mg amlodipine. She does not complain of any leg swelling.   Plan: Medications:  continue current medications Educational resources provided: brochure Self management tools provided:   Please follow up with Dr Ellwood Dense at the next visit.

## 2015-02-13 NOTE — Progress Notes (Signed)
Patient ID: [name], female   DOB: [birth year], 39 y.o.   MRN: [id number]   Subjective:   Patient ID: [name] female   DOB: 03-Jul-1976 39 y.o.   MRN: [id number]  HPI: [name] is a 38 y.o. African American woman with notable PMH of HTN, migraine, recent episode of abdominal pain now resolved, most likely viral gastritis, who is here for a 2 week follow up after being seen here on the 21st for abdominal pain and hypertension. Today she is doing much better, but complains of constipation.  Abdominal Pain: She says that it has resolved, and she is doing much better. CMP was ordered last time and the results were WNL.   HTN: Her blood pressure is very well controlled today after being started on 5 mg amlodipine. Today it is 132/87 and previously it was 162/91.   Constipation: She says that she has been taking Linzess on and off for last 3 years but it does not seem to help.   Past Medical History  Diagnosis Date  . Osteoarthritis of hip   . History of migraine headaches     Typical symptoms - bright lights, unilateral (starting behind her left ear), throbbing .  Marland Kitchen Surgical menopause 08/2007    On HRT. Occuring since 01/09 following TAH and R-salpingo-oophorectomy   . Tibial fracture 10/2008    Left - Sustained 2/2 fall down stairs. Patient is S/P closed internal fixation with Smith Nephew tibial nail locked proximal and distal  . Endometriosis     S/P TAH and salpingo-oophorectomy, right  . Chronic cough   . Gastritis     Thought due to chronic NSAID use 2/2 pain from her congenital hip abnormality  . Epigastric pain     Chronic, thought 2/2 gastritis, EGD (07/14/2011) showing chronic duodenitis, H.pyloir neg // Repeat EGD (09/2011) - esophagealring, sessible polyp in stomach body, hiatal hernia - rec ad probiotic, await bx  . 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 With chronic OA of  hip  . Vitamin D deficiency 11/2009    Vit D level 15 in 11/2009  . Closed fibular fracture 10/2008    Left - Sustained 2/2 fall down stairs (same fall as tibular fracture). S/P internal fixation.  . Hidradenitis suppurativa 05/2006  . Protrusio acetabuli     diagnosed at age 68  . Ovarian cancer 1995  . Headache(784.0)     Migraines  . Duodenitis     EGD (07/14/2011) showing chronic duodenitis, H.pyloir neg  . Esophageal ring     s/p dilatation during EGD (07/2011) - Dr. Oneida Alar  . Anxiety     pt denies having dx of anxiety  . Sessile colonic polyp     noted 09/2011 -- > rec colonoscopy in 10 years, 2023  . Internal hemorrhoids   . Hypertension   . Bunion, right foot   . BCC (basal cell carcinoma of skin)     right foot    Current Outpatient Prescriptions  Medication Sig Dispense Refill  . amLODipine (NORVASC) 5 MG tablet Take 1 tablet (5 mg total) by mouth daily. 30 tablet 11  . ESTROGENS CONJUGATED PO Take by mouth.    Marland Kitchen albuterol (PROVENTIL HFA;VENTOLIN HFA) 108 (90 BASE) MCG/ACT inhaler Inhale 2 puffs into the lungs every 4 (four) hours as needed for wheezing or shortness of breath. (Patient not taking: Reported on 02/13/2015) 1 Inhaler 0  . calcium  carbonate (OS-CAL) 600 MG TABS tablet Take 1 tablet (600 mg total) by mouth 2 (two) times daily with a meal. (Patient not taking: Reported on 11/02/2014) 60 tablet 1  . Cholecalciferol 400 UNITS CAPS Take 2 capsules (800 Units total) by mouth daily. (Patient not taking: Reported on 11/02/2014) 60 each 11  . ipratropium (ATROVENT) 0.06 % nasal spray Place 2 sprays into both nostrils 4 (four) times daily. 15 mL 0  . Linaclotide (LINZESS) 290 MCG CAPS capsule TAKE 1 CAPSULE BY MOUTH DAILY. TAKE 30 MINUTES BEFORE BREAKFAST DAILY 30 capsule 11  . ondansetron (ZOFRAN) 4 MG tablet Take 1 tablet (4 mg total) by mouth every 8 (eight) hours as needed for nausea or vomiting. (Patient not taking: Reported on 02/13/2015) 20 tablet 0  . ranitidine  (ZANTAC) 150 MG tablet Take 1 tablet (150 mg total) by mouth 2 (two) times daily. (Patient not taking: Reported on 02/13/2015) 60 tablet 2   No current facility-administered medications for this visit.   Family History  Problem Relation Age of Onset  . Other Father     deceased while in service.   . Breast cancer Sister 48    remission  . Cancer Sister     breast  . Colon cancer Neg Hx   . Liver disease Neg Hx   . Lupus Cousin    History   Social History  . Marital Status: Married    Spouse Name: [name]  . Number of Children: 2  . Years of Education: bs degree   Occupational History  . Stay-at-home mom    Social History Main Topics  . Smoking status: Never Smoker   . Smokeless tobacco: Not on file  . Alcohol Use: No  . Drug Use: No  . Sexual Activity: Not on file   Other Topics Concern  . None   Social History Narrative   Insurance: Medicare.   Patient is married with two children, [name] and [name].          Review of Systems:   Review of Systems  Constitutional: Negative for fever, chills, weight loss and malaise/fatigue.  HENT: Negative.   Respiratory: Negative.   Cardiovascular: Negative.  Negative for leg swelling.  Gastrointestinal: Positive for constipation. Negative for heartburn, nausea, vomiting, abdominal pain, diarrhea and blood in stool.  Musculoskeletal: Negative for falls.  Neurological: Negative.  Negative for weakness and headaches.    Objective:  Physical Exam: Filed Vitals:   02/13/15 1029  BP: 132/87  Pulse: 66  Temp: 98.1 F (36.7 C)  TempSrc: Oral  Height: 5\' 11"  (1.803 m)  Weight: 181 lb 12.8 oz (82.464 kg)  SpO2: 100%    Physical Exam  Constitutional: She is oriented to person, place, and time. Vital signs are normal. She appears well-developed and well-nourished.  HENT:  Head: Normocephalic and atraumatic.  Neck: Normal range of motion. Neck supple.  Cardiovascular: Normal rate, regular rhythm and intact distal pulses.     No murmur heard. Pulmonary/Chest: Effort normal and breath sounds normal.  Abdominal: Soft. Bowel sounds are normal. She exhibits no distension. There is no tenderness. There is no rebound and no guarding.  Neurological: She is alert and oriented to person, place, and time.  Skin: Skin is warm.  Psychiatric: She has a normal mood and affect.     Assessment & Plan:   We addressed hypertension, constipation, vit D deficiency, and screening mammograms at this visit. I spent approximately 30 minutes with this patient.  Please see problem  based charting for full A&P

## 2015-02-13 NOTE — Assessment & Plan Note (Signed)
A: Her abdominal pain has resolved, so most likely viral in etiology   Plan: I recommended her to use ranitidine PRN.       Also asked her to stop linzess as she seemed to not tolerate it well

## 2015-02-19 NOTE — Progress Notes (Signed)
Internal Medicine Clinic Attending  I saw and evaluated the patient.  I personally confirmed the key portions of the history and exam documented by Dr. Saraiya and I reviewed pertinent patient test results.  The assessment, diagnosis, and plan were formulated together and I agree with the documentation in the resident's note.  

## 2015-02-19 NOTE — Addendum Note (Signed)
Addended by: Gilles Chiquito B on: 02/19/2015 11:12 AM   Modules accepted: Level of Service

## 2015-02-22 ENCOUNTER — Encounter: Payer: Medicare Other | Admitting: Internal Medicine

## 2015-03-07 ENCOUNTER — Telehealth: Payer: Self-pay | Admitting: Internal Medicine

## 2015-03-07 NOTE — Telephone Encounter (Signed)
Call to patient to confirm appointment for 03/08/15 at 10:15 lmtcb

## 2015-03-08 ENCOUNTER — Encounter: Payer: Medicare Other | Admitting: Internal Medicine

## 2015-05-16 ENCOUNTER — Ambulatory Visit (HOSPITAL_COMMUNITY)
Admission: RE | Admit: 2015-05-16 | Discharge: 2015-05-16 | Disposition: A | Discharge status: Home / Self Care | Payer: BLUE CROSS/BLUE SHIELD | Source: Ambulatory Visit | Attending: Oncology | Admitting: Oncology

## 2015-05-16 ENCOUNTER — Ambulatory Visit (INDEPENDENT_AMBULATORY_CARE_PROVIDER_SITE_OTHER): Payer: BLUE CROSS/BLUE SHIELD | Admitting: Internal Medicine

## 2015-05-16 ENCOUNTER — Encounter: Payer: Self-pay | Admitting: Internal Medicine

## 2015-05-16 VITALS — BP 120/80 | HR 70 | Temp 97.5°F | Ht 71.0 in | Wt 175.2 lb

## 2015-05-16 DIAGNOSIS — Z0000 Encounter for general adult medical examination without abnormal findings: Secondary | ICD-10-CM

## 2015-05-16 DIAGNOSIS — S91331A Puncture wound without foreign body, right foot, initial encounter: Secondary | ICD-10-CM

## 2015-05-16 DIAGNOSIS — M62830 Muscle spasm of back: Secondary | ICD-10-CM | POA: Diagnosis not present

## 2015-05-16 DIAGNOSIS — W25XXXA Contact with sharp glass, initial encounter: Secondary | ICD-10-CM | POA: Diagnosis not present

## 2015-05-16 MED ORDER — CYCLOBENZAPRINE HCL 10 MG PO TABS: 10.0000 mg | ORAL_TABLET | Freq: Three times a day (TID) | ORAL | Status: DC | PRN

## 2015-05-16 MED ORDER — IBUPROFEN 400 MG PO TABS: 400.0000 mg | ORAL_TABLET | Freq: Four times a day (QID) | ORAL | Status: DC | PRN

## 2015-05-16 NOTE — Patient Instructions (Signed)
[name] it was nice meeting you today.  -Take Flexeril 10 mg: 1 tablet by mouth every 8 hours as needed for back muscle spasms. You may get drowsy or dizzy when you first start taking the medicine or change doses. Do not drive, use machinery, or do anything that may be dangerous until you know how the medicine affects you. Stand or sit up slowly.  -Take Ibuprofen 400 mg: 1 tablet by mouth every 6 hours as needed for back and foot pain.  -I have ordered an x-ray if your foot today and will call you when the results come back.

## 2015-05-18 DIAGNOSIS — S91331A Puncture wound without foreign body, right foot, initial encounter: Secondary | ICD-10-CM | POA: Insufficient documentation

## 2015-05-18 NOTE — Progress Notes (Signed)
Patient ID: [name], female   DOB: [birth year], 39 y.o.   MRN: [id number]   Subjective:   Patient ID: [name] female   DOB: 1976-02-05 39 y.o.   MRN: [id number]  HPI: [name] is a 39 y.o. female with a past medical history of chronic lower back pain and conditions listed below presenting to the clinic today with pain in her upper back and neck. Patient is also complaining of pain in her right foot since she stepped on a piece of broken glass a few days ago. States the site of puncture wound is tender, however, no blood or pus drainage. Denies having any fevers, chills, nausea, or vomiting. As per Epic records, patient received a TD vaccine in 2011. Please refer to the assessment and plan section for the status of the patient's chronic medical conditions.     Past Medical History  Diagnosis Date  . Osteoarthritis of hip   . History of migraine headaches     Typical symptoms - bright lights, unilateral (starting behind her left ear), throbbing .  Marland Kitchen Surgical menopause 08/2007    On HRT. Occuring since 01/09 following TAH and R-salpingo-oophorectomy   . Tibial fracture 10/2008    Left - Sustained 2/2 fall down stairs. Patient is S/P closed internal fixation with Smith Nephew tibial nail locked proximal and distal  . Endometriosis     S/P TAH and salpingo-oophorectomy, right  . Chronic cough   . Gastritis     Thought due to chronic NSAID use 2/2 pain from her congenital hip abnormality  . Epigastric pain     Chronic, thought 2/2 gastritis, EGD (07/14/2011) showing chronic duodenitis, H.pyloir neg // Repeat EGD (09/2011) - esophagealring, sessible polyp in stomach body, hiatal hernia - rec ad probiotic, await bx  . 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 With chronic OA of hip  . Vitamin D deficiency 11/2009    Vit D level 15 in 11/2009  . Closed fibular fracture 10/2008    Left -  Sustained 2/2 fall down stairs (same fall as tibular fracture). S/P internal fixation.  . Hidradenitis suppurativa 05/2006  . Protrusio acetabuli     diagnosed at age 3  . Ovarian cancer ([place]) 1995  . Headache(784.0)     Migraines  . Duodenitis     EGD (07/14/2011) showing chronic duodenitis, H.pyloir neg  . Esophageal ring     s/p dilatation during EGD (07/2011) - Dr. Oneida Alar  . Anxiety     pt denies having dx of anxiety  . Sessile colonic polyp     noted 09/2011 -- > rec colonoscopy in 10 years, 2023  . Internal hemorrhoids   . Hypertension   . Bunion, right foot   . BCC (basal cell carcinoma of skin)     right foot    Current Outpatient Prescriptions  Medication Sig Dispense Refill  . albuterol (PROVENTIL HFA;VENTOLIN HFA) 108 (90 BASE) MCG/ACT inhaler Inhale 2 puffs into the lungs every 4 (four) hours as needed for wheezing or shortness of breath. (Patient not taking: Reported on 02/13/2015) 1 Inhaler 0  . amLODipine (NORVASC) 5 MG tablet Take 1 tablet (5 mg total) by mouth daily. 30 tablet 11  . calcium carbonate (OS-CAL) 600 MG TABS tablet Take 1 tablet (600 mg total) by mouth 2 (two) times daily with a meal. (Patient not taking: Reported on 11/02/2014) 60 tablet 1  . Cholecalciferol 400  UNITS CAPS Take 2 capsules (800 Units total) by mouth daily. (Patient not taking: Reported on 11/02/2014) 60 each 11  . cyclobenzaprine (FLEXERIL) 10 MG tablet Take 1 tablet (10 mg total) by mouth every 8 (eight) hours as needed for muscle spasms. 20 tablet 0  . ESTROGENS CONJUGATED PO Take by mouth.    Marland Kitchen ibuprofen (ADVIL,MOTRIN) 400 MG tablet Take 1 tablet (400 mg total) by mouth every 6 (six) hours as needed (for pain). 30 tablet 0  . ipratropium (ATROVENT) 0.06 % nasal spray Place 2 sprays into both nostrils 4 (four) times daily. 15 mL 0  . ondansetron (ZOFRAN) 4 MG tablet Take 1 tablet (4 mg total) by mouth every 8 (eight) hours as needed for nausea or vomiting. (Patient not taking: Reported on  02/13/2015) 20 tablet 0  . ranitidine (ZANTAC) 150 MG tablet Take 1 tablet (150 mg total) by mouth 2 (two) times daily. (Patient not taking: Reported on 02/13/2015) 60 tablet 2   No current facility-administered medications for this visit.   Family History  Problem Relation Age of Onset  . Other Father     deceased while in service.   . Breast cancer Sister 46    remission  . Cancer Sister     breast  . Colon cancer Neg Hx   . Liver disease Neg Hx   . Lupus Cousin    Social History   Social History  . Marital Status: Married    Spouse Name: N/A  . Number of Children: 2  . Years of Education: bs degree   Occupational History  . Stay-at-home mom    Social History Main Topics  . Smoking status: Never Smoker   . Smokeless tobacco: None  . Alcohol Use: No  . Drug Use: No  . Sexual Activity: Not Asked   Other Topics Concern  . None   Social History Narrative   Insurance: Medicare.   Patient is married with two children, [place] and [place].          Review of Systems: Review of Systems  Constitutional: Negative for fever and chills.  HENT: Negative for ear pain.   Eyes: Negative for blurred vision and pain.  Respiratory: Negative for cough, shortness of breath and wheezing.   Cardiovascular: Negative for chest pain, palpitations and leg swelling.  Gastrointestinal: Negative for nausea, vomiting and abdominal pain.  Genitourinary: Negative for dysuria, urgency and frequency.  Musculoskeletal: Positive for back pain and neck pain. Negative for myalgias.  Skin: Negative for itching and rash.  Neurological: Negative for dizziness, tingling, sensory change, focal weakness and headaches.   Objective:  Physical Exam: Filed Vitals:   05/16/15 1102 05/16/15 1111  BP: 156/88 120/80  Pulse: 62 70  Temp: 97.5 F (36.4 C)   TempSrc: Oral   Height: 5\' 11"  (1.803 m)   Weight: 175 lb 3.2 oz (79.47 kg)   SpO2: 100%    Physical Exam  Constitutional: She is oriented to  person, place, and time. She appears well-developed and well-nourished. No distress.  HENT:  Head: Normocephalic and atraumatic.  Eyes: EOM are normal. Pupils are equal, round, and reactive to light.  Neck: Normal range of motion. Neck supple.  Cardiovascular: Normal rate, regular rhythm and intact distal pulses.   Pulmonary/Chest: Effort normal. No respiratory distress. She has no wheezes. She has no rales.  Abdominal: Soft. Bowel sounds are normal. She exhibits no distension. There is no tenderness.  Musculoskeletal: Normal range of motion. She exhibits tenderness. She  exhibits no edema.  Right foot: 1-2 mm puncture wound on the plantar surface (first metatarsophalangeal joint area). Mild erythema surrounding the site of puncture. No active drainage. Area mildly tender to palpation. Neck: Normal range of motion. Dorsal surface of the neck/ trapezius muscle distribution mildly tender to palpation. No pain on palpation of C-spine. Back: Upper back in the trapezius muscle distribution is mildly tender to palpation. No pain on palpation of thoracic, lumbar, and sacral spine. No paraspinal muscle tenderness. Normal range of motion.   Neurological: She is alert and oriented to person, place, and time. No cranial nerve deficit.  Strength 5 out of 5 in bilateral upper and lower extremities. Sensation grossly intact in bilateral upper and lower extremities.  Skin: Skin is warm and dry.   Assessment & Plan:

## 2015-05-18 NOTE — Assessment & Plan Note (Signed)
Patient reports stepping on a piece of broken glass a few days ago which punctured the plantar surface of her right foot (first metatarsophalangeal joint area). Denies any fevers, chills, nausea, or vomiting. Denies any blood or pus drainage. Her last TD vaccine was in 2011. X-ray of the right foot did not show any foreign object in the soft tissue. I tried calling the patient to discuss the x-ray findings with her but both the phone numbers listed on epic did not work. -Ibuprofen 400 mg 1 tablet every 6 hours as needed for pain -Reassess at next visit in 2 weeks

## 2015-05-18 NOTE — Assessment & Plan Note (Addendum)
Patient states she has a history of chronic lower back pain due to scoliosis. States she's been having pain in her upper back and the back of her neck intermittently for several months now. Patient states the pain is worse with movement and describes it as being dull in character but sometimes "needle-like." Attributes her back pain to having large breasts.  States she does 100 crunches every day and diet puts strain on her neck. Physical exam showing normal range of motion of the neck, however, mild tenderness on palpation of trapezius muscle. No paraspinal muscle tenderness. No pain on palpation of the spine. Lumbar x-ray from 2010 showing scoliosis. Patient did have asymmetry of her paraspinal muscles on physical exam. Patient's neck and upper back pain is likely due to back muscle spasms from asymmetry (scoliosis).  -Flexeril 10 mg 1 tablet by mouth every 8 hours as needed for muscle spasms -Ibuprofen 400 mg 1 tablet by mouth every 6 hours as needed for pain -She has been advised not to do strenuous physical activity. -Reassess symptoms at follow-up visit in 2 weeks.

## 2015-05-18 NOTE — Assessment & Plan Note (Signed)
Patient declined flu vaccine today.

## 2015-05-24 ENCOUNTER — Other Ambulatory Visit: Payer: Self-pay

## 2015-05-24 DIAGNOSIS — Z1231 Encounter for screening mammogram for malignant neoplasm of breast: Secondary | ICD-10-CM

## 2015-06-18 ENCOUNTER — Encounter: Payer: Self-pay | Admitting: *Deleted

## 2015-06-20 NOTE — Progress Notes (Signed)
Medicine attending: I personally interviewed and briefly examined this patient, and reviewed pertinent clinical laboratory  data  with resident physician Dr.Vasu Rathore on the day of the patient visit and we discussed a   management plan. 

## 2015-08-15 DIAGNOSIS — L821 Other seborrheic keratosis: Secondary | ICD-10-CM | POA: Diagnosis not present

## 2015-08-15 DIAGNOSIS — L811 Chloasma: Secondary | ICD-10-CM | POA: Diagnosis not present

## 2015-11-14 ENCOUNTER — Ambulatory Visit (HOSPITAL_COMMUNITY)
Admission: RE | Admit: 2015-11-14 | Discharge: 2015-11-14 | Disposition: A | Discharge status: Home / Self Care | Payer: BLUE CROSS/BLUE SHIELD | Source: Ambulatory Visit | Attending: Student in an Organized Health Care Education/Training Program | Admitting: Student in an Organized Health Care Education/Training Program

## 2015-11-14 ENCOUNTER — Encounter: Payer: Self-pay | Admitting: Internal Medicine

## 2015-11-14 ENCOUNTER — Ambulatory Visit (INDEPENDENT_AMBULATORY_CARE_PROVIDER_SITE_OTHER): Payer: Medicare Other | Admitting: Internal Medicine

## 2015-11-14 ENCOUNTER — Telehealth: Payer: Self-pay | Admitting: Internal Medicine

## 2015-11-14 ENCOUNTER — Encounter (HOSPITAL_COMMUNITY): Payer: BLUE CROSS/BLUE SHIELD

## 2015-11-14 VITALS — BP 140/75 | HR 64 | Temp 98.5°F | Ht 71.0 in | Wt 183.8 lb

## 2015-11-14 DIAGNOSIS — M79662 Pain in left lower leg: Secondary | ICD-10-CM | POA: Insufficient documentation

## 2015-11-14 NOTE — Assessment & Plan Note (Addendum)
Patient presents with complaint of left calf pain and swelling for the past 3 days. Patient states pain is located in the posterior left calf and into the left ankle. Pain is worse with ambulation and dorsiflexion of the left foot. It is a constant, throbbing, severe pain that does not improve with rest. Pain gets to a 9/10. She has tried ibuprofen and epsom salt baths without relief. Patient believes left calf and ankle are more swollen than the right. She denies personal or family history of blood clots. She has a history of basal cell carcinoma in 2010, but no active cancer diagnosis. Left calf is 37.5 cm and right calf 37 cm. There is no evidence of edema or superficial veins on examination. She does have tenderness along the deep venous system and a positive Homan's sign. Patient is a non-smoker. She has been on estrogen since 2007 after her hysterectomy.   Assessment: Low to intermediate risk for DVT; alternate diagnosis: Gastrocnemius muscle strain  Plan: -Rule out DVT with LE Doppler -If positive, will consider NOAc -If negative, will treat conservatively with rest, ice, elevation, and NSAIDs  Addendum: No evidence of DVT.

## 2015-11-14 NOTE — Progress Notes (Signed)
Preliminary results by tech - Left Lower Ext. Venous Completed. Negative for deep and superficial vein thrombosis in the left leg. Oda Cogan, BS, RDMS, RVT

## 2015-11-14 NOTE — Telephone Encounter (Signed)
APPT. REMINDER CALL, NO ANSWER, NO VOICE MAIL °

## 2015-11-14 NOTE — Telephone Encounter (Signed)
Pt would like to speak to nurse

## 2015-11-14 NOTE — Progress Notes (Signed)
Internal Medicine Clinic Attending  Case discussed with Dr. Richardson at the time of the visit.  We reviewed the resident's history and exam and pertinent patient test results.  I agree with the assessment, diagnosis, and plan of care documented in the resident's note. 

## 2015-11-14 NOTE — Patient Instructions (Signed)
Critical results can be called paged to [phone]  We are ruling out a deep venous blood clot.  If negative, you are likely experiencing musculoskeletal pain. Continue ibuprofen and tylenol, rest, ice alternating with heat and elevation of the left leg. Return if symptoms are not improved in one week.

## 2015-11-14 NOTE — Telephone Encounter (Signed)
Pt states her L calf is swollen, painful since 4/4, concerned that she has "a blood clot". Desires appt, 1315 dr burns

## 2015-11-14 NOTE — Progress Notes (Signed)
Subjective:    Patient ID: [name], female    DOB: 01-21-1976, 40 y.o.   MRN: [id number]  HPI Valena R Allen-Bruce is a 40 y.o. female with PMHx of HTN and OA who presents to the clinic for complaint of acutely swollen and painful left calf. Please see A&P for the status of the patient's chronic medical problems.   Past Medical History  Diagnosis Date  . Osteoarthritis of hip   . History of migraine headaches     Typical symptoms - bright lights, unilateral (starting behind her left ear), throbbing .  Marland Kitchen Surgical menopause 08/2007    On HRT. Occuring since 01/09 following TAH and R-salpingo-oophorectomy   . Tibial fracture 10/2008    Left - Sustained 2/2 fall down stairs. Patient is S/P closed internal fixation with Smith Nephew tibial nail locked proximal and distal  . Endometriosis     S/P TAH and salpingo-oophorectomy, right  . Chronic cough   . Gastritis     Thought due to chronic NSAID use 2/2 pain from her congenital hip abnormality  . Epigastric pain     Chronic, thought 2/2 gastritis, EGD (07/14/2011) showing chronic duodenitis, H.pyloir neg // Repeat EGD (09/2011) - esophagealring, sessible polyp in stomach body, hiatal hernia - rec ad probiotic, await bx  . 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 With chronic OA of hip  . Vitamin D deficiency 11/2009    Vit D level 15 in 11/2009  . Closed fibular fracture 10/2008    Left - Sustained 2/2 fall down stairs (same fall as tibular fracture). S/P internal fixation.  . Hidradenitis suppurativa 05/2006  . Protrusio acetabuli     diagnosed at age 40  . Ovarian cancer ([place]) 1995  . Headache(784.0)     Migraines  . Duodenitis     EGD (07/14/2011) showing chronic duodenitis, H.pyloir neg  . Esophageal ring     s/p dilatation during EGD (07/2011) - Dr. Oneida Alar  . Anxiety     pt denies having dx of anxiety  . Sessile colonic polyp     noted 09/2011 -- >  rec colonoscopy in 10 years, 2023  . Internal hemorrhoids   . Hypertension   . Bunion, right foot   . BCC (basal cell carcinoma of skin)     right foot     Outpatient Encounter Prescriptions as of 11/14/2015  Medication Sig Note  . albuterol (PROVENTIL HFA;VENTOLIN HFA) 108 (90 BASE) MCG/ACT inhaler Inhale 2 puffs into the lungs every 4 (four) hours as needed for wheezing or shortness of breath. (Patient not taking: Reported on 02/13/2015)   . amLODipine (NORVASC) 5 MG tablet Take 1 tablet (5 mg total) by mouth daily.   . calcium carbonate (OS-CAL) 600 MG TABS tablet Take 1 tablet (600 mg total) by mouth 2 (two) times daily with a meal. (Patient not taking: Reported on 11/02/2014)   . Cholecalciferol 400 UNITS CAPS Take 2 capsules (800 Units total) by mouth daily. (Patient not taking: Reported on 11/02/2014)   . cyclobenzaprine (FLEXERIL) 10 MG tablet Take 1 tablet (10 mg total) by mouth every 8 (eight) hours as needed for muscle spasms.   . ESTROGENS CONJUGATED PO Take by mouth. 03/03/2014: Low dose   . ibuprofen (ADVIL,MOTRIN) 400 MG tablet Take 1 tablet (400 mg total) by mouth every 6 (six) hours as needed (for pain).   Marland Kitchen ipratropium (ATROVENT) 0.06 % nasal spray Place  2 sprays into both nostrils 4 (four) times daily.   . ondansetron (ZOFRAN) 4 MG tablet Take 1 tablet (4 mg total) by mouth every 8 (eight) hours as needed for nausea or vomiting. (Patient not taking: Reported on 02/13/2015)   . ranitidine (ZANTAC) 150 MG tablet Take 1 tablet (150 mg total) by mouth 2 (two) times daily. (Patient not taking: Reported on 02/13/2015)    No facility-administered encounter medications on file as of 11/14/2015.    Family History  Problem Relation Age of Onset  . Other Father     deceased while in service.   . Breast cancer Sister 73    remission  . Cancer Sister     breast  . Colon cancer Neg Hx   . Liver disease Neg Hx   . Lupus Cousin     Social History   Social History  . Marital Status:  Married    Spouse Name: N/A  . Number of Children: 2  . Years of Education: bs degree   Occupational History  . Stay-at-home mom    Social History Main Topics  . Smoking status: Never Smoker   . Smokeless tobacco: Not on file  . Alcohol Use: No  . Drug Use: No  . Sexual Activity: Not on file   Other Topics Concern  . Not on file   Social History Narrative   Insurance: Medicare.   Patient is married with two children, [place] and [place].           Review of Systems General: Denies fever, chills, fatigue Respiratory: Denies SOB, cough, DOE   Cardiovascular: Denies chest pain.  Musculoskeletal: Admits to left calf pain with swelling.  Skin: Denies rash and wounds.  Neurological: Denies dizziness, headaches, weakness, lightheadedness, numbness    Objective:   Physical Exam Filed Vitals:   11/14/15 1332  BP: 140/75  Pulse: 64  Temp: 98.5 F (36.9 C)  TempSrc: Oral  Height: 5\' 11"  (1.803 m)  Weight: 183 lb 12.8 oz (83.371 kg)  SpO2: 100%   General: Vital signs reviewed.  Patient is well-developed and well-nourished, in no acute distress and cooperative with exam.  Cardiovascular: RRR, S1 normal, S2 normal, no murmurs, gallops, or rubs. Pulmonary/Chest: Clear to auscultation bilaterally, no wheezes, rales, or rhonchi. Extremities: Pain on palpation of left lower extremity along deep venous system. +Homan's Sign. No lower extremity edema bilaterally, pulses symmetric and intact bilaterally.  Neurological: A&O x3, sensory intact to light touch bilaterally.  Skin: Warm, dry and intact. No rashes or erythema. Psychiatric: Normal mood and affect. speech and behavior is normal. Cognition and memory are normal.      Assessment & Plan:   Please see problem based assessment and plan.

## 2015-11-15 ENCOUNTER — Ambulatory Visit: Payer: BLUE CROSS/BLUE SHIELD | Admitting: Internal Medicine

## 2015-11-19 ENCOUNTER — Telehealth: Payer: Self-pay | Admitting: Internal Medicine

## 2015-11-19 NOTE — Telephone Encounter (Signed)
Attempted to call patient to discuss ultrasound results negative for DVT. Patient was informed at the end of the study that the test was negative and to resume supportive care for calf pain. I was unable to reach the patient today due to a busy signal x 2. If patient has any further questions she can call the clinic.  Martyn Malay, DO PGY-2 Internal Medicine Resident Pager # [phone] 11/19/2015 11:47 AM

## 2015-12-03 ENCOUNTER — Other Ambulatory Visit: Payer: Self-pay

## 2015-12-03 DIAGNOSIS — I10 Essential (primary) hypertension: Secondary | ICD-10-CM

## 2015-12-03 MED ORDER — AMLODIPINE BESYLATE 5 MG PO TABS: 5.0000 mg | ORAL_TABLET | Freq: Every day | ORAL | Status: DC

## 2015-12-03 NOTE — Telephone Encounter (Addendum)
Pt requesting amlodipine  to be filled @ [facility] in [place].

## 2015-12-03 NOTE — Telephone Encounter (Signed)
She needs an appointment with me in the new Academic year

## 2015-12-03 NOTE — Telephone Encounter (Signed)
Spoke with [facility] - pt has no more refills despite [place] writing it for 11 refills in June last year.  Per pharmacy, pt has had rx transferred a few times over the last months.    Would you mind just sending a fresh rx?

## 2015-12-17 ENCOUNTER — Ambulatory Visit (INDEPENDENT_AMBULATORY_CARE_PROVIDER_SITE_OTHER): Payer: Medicare Other | Admitting: Internal Medicine

## 2015-12-17 ENCOUNTER — Encounter: Payer: Self-pay | Admitting: Internal Medicine

## 2015-12-17 VITALS — BP 159/64 | HR 60 | Temp 98.0°F | Wt 182.6 lb

## 2015-12-17 DIAGNOSIS — R1013 Epigastric pain: Secondary | ICD-10-CM

## 2015-12-17 DIAGNOSIS — R51 Headache: Secondary | ICD-10-CM

## 2015-12-17 DIAGNOSIS — R519 Headache, unspecified: Secondary | ICD-10-CM | POA: Insufficient documentation

## 2015-12-17 DIAGNOSIS — I10 Essential (primary) hypertension: Secondary | ICD-10-CM

## 2015-12-17 HISTORY — DX: Epigastric pain: R10.13

## 2015-12-17 MED ORDER — PANTOPRAZOLE SODIUM 40 MG PO TBEC: 40.0000 mg | DELAYED_RELEASE_TABLET | Freq: Two times a day (BID) | ORAL | Status: DC

## 2015-12-17 MED ORDER — AMLODIPINE BESYLATE 10 MG PO TABS: 10.0000 mg | ORAL_TABLET | Freq: Every day | ORAL | Status: DC

## 2015-12-17 MED ORDER — ACETAMINOPHEN 500 MG PO TABS: 1000.0000 mg | ORAL_TABLET | Freq: Four times a day (QID) | ORAL | Status: DC | PRN

## 2015-12-17 NOTE — Assessment & Plan Note (Addendum)
Her left-sided headache is strange and is not classic for migraine, cluster, nor tension headache; given the concomitant vertigo, and history of MRI in 2014 showing calcification of the falx, I've re-ordered another brain MRI as I'm concerned this could be a meningioma.

## 2015-12-17 NOTE — Assessment & Plan Note (Signed)
Her pressure was elevated today to 159/64 so I've asked her to increase her amlodipine from 5 to 10mg  daily. She was hesitant to change the dose but agreed to increase it if her pressures remain elevated at home. We'll need to clarify if she's changed from the 5 to 10mg  dose at her next visit.

## 2015-12-17 NOTE — Progress Notes (Signed)
Internal Medicine Clinic Attending  Case discussed with Dr. Flores at the time of the visit.  We reviewed the resident's history and exam and pertinent patient test results.  I agree with the assessment, diagnosis, and plan of care documented in the resident's note. 

## 2015-12-17 NOTE — Progress Notes (Signed)
Patient ID: [name], female   DOB: 09-28-1975, 40 y.o.   MRN: [id number] [facility] Subjective:   Patient ID: [name] female   DOB: [birth year] 40 y.o.   MRN: [id number]  HPI: [name] is a 40 y.o. female with a chronic migraines and hypertension presenting with left-sided headache and periumbilical abdominal pain and follow-up hypertension.  Left-sided headache: For the last 2 weeks, she has been having sharp, left-sided headaches that last about 10 seconds associated with vertigo on a daily basis. She denies any lacrimation, droopy eyelid, tinnitus, hearing loss, unilateral weakness, paresthesias, urinary incontinence, fevers, rash, or other complaints.  Periumbilical abdominal pain: She has been taking ibuprofen 3 times daily for her headaches, numbness since felt periumbilical abdominal pain and her stools have appeared more dark recently. She feels slightly lightheaded when she stands. She denies dysuria, frequency, or abdominal bloating.  Hypertension: She is on amlodipine 5mg  daily and her pressures have ranged between 120-140 at home.  She is not smoking and I have reviewed her medications with her today.  Review of Systems  Constitutional: Negative for fever, chills, weight loss and malaise/fatigue.  HENT: Negative for ear pain, hearing loss and tinnitus.   Eyes: Positive for blurred vision. Negative for pain.  Respiratory: Negative for cough and shortness of breath.   Cardiovascular: Negative for chest pain.  Genitourinary: Negative for dysuria, urgency and frequency.  Musculoskeletal: Positive for back pain. Negative for joint pain.  Skin: Negative for rash.  Neurological: Positive for dizziness and headaches. Negative for sensory change, speech change, focal weakness, seizures and loss of consciousness.    Objective:  Physical Exam: Filed Vitals:   12/17/15 0901  BP: 159/64  Pulse: 60  Temp: 98 F (36.7 C)   TempSrc: Oral  Weight: 182 lb 9.6 oz (82.827 kg)  SpO2: 100%   General: friendly lady resting in bed comfortably, appropriately conversational HEENT: no scleral icterus, extra-ocular muscles intact, oropharynx without lesions Cardiac: regular rate and rhythm, no rubs, murmurs or gallops Pulm: breathing well, clear to auscultation bilaterally Abd: bowel sounds normal, soft, nondistended, tender to light palpation throughout, no rashes, no hernias Ext: warm and well perfused, without pedal edema Lymph: no cervical or supraclavicular lymphadenopathy Skin: no rash, hair, or nail changes Neuro: alert and oriented X3, cranial nerves II-XII grossly intact, strength 5/5 throughout, sensation intact to light touch throughout, normal finger-to-nose and rapid alternating movements  Assessment & Plan:  Case discussed with Dr. Dareen Piano  Headache Her left-sided headache is strange and is not classic for migraine, cluster, nor tension headache; given the concomitant vertigo, and history of MRI in 2014 showing calcification of the falx, I've re-ordered another brain MRI as I'm concerned this could be a meningioma.  Abdominal pain, epigastric This nagging epigastric abdominal pain and questionable melena sounds most like gastritis versus peptic ulcer disease. I considered recurrent endometriosis as well; she has had a hysterectomy and oophorectomy in the early 2000s so she doesn't have endogenous hormones. However, she was started estradiol so this could be activating some residual endometriomas. Her last colonoscopy and endoscopy in 2013 showed single sessile polyps in both the rectum and stomach body, but I cannot find the pathology results.  Today I'll focus on treating her for gastritis because I think this is the most-likely cause of her epigastric pain. I've asked her to stop taking NSAIDs and take acetaminophen instead, and I've prescribed pantoprazole 40mg  twice daily for 2 weeks. I'm checking a  CBC  today to ensure she's not overtly anemic. If her pain is not improved in 2 weeks, I think we should consider asking her to try stopping the estradiol, and consider referral to gastroenterology to further evaluate and consider re-scoping her.  Hypertension Her pressure was elevated today to 159/64 so I've asked her to increase her amlodipine from 5 to 10mg  daily. She was hesitant to change the dose but agreed to increase it if her pressures remain elevated at home. We'll need to clarify if she's changed from the 5 to 10mg  dose at her next visit.   Medications Ordered Meds ordered this encounter  Medications  . estradiol (ESTRACE) 1 MG tablet    Sig: Take 1 mg by mouth.  Marland Kitchen amLODipine (NORVASC) 10 MG tablet    Sig: Take 1 tablet (10 mg total) by mouth daily.    Dispense:  90 tablet    Refill:  3  . acetaminophen (TYLENOL) 500 MG tablet    Sig: Take 2 tablets (1,000 mg total) by mouth every 6 (six) hours as needed.    Dispense:  100 tablet    Refill:  2  . pantoprazole (PROTONIX) 40 MG tablet    Sig: Take 1 tablet (40 mg total) by mouth 2 (two) times daily before a meal.    Dispense:  30 tablet    Refill:  1   Other Orders Orders Placed This Encounter  Procedures  . MR Brain W Wo Contrast    Standing Status: Future     Number of Occurrences:      Standing Expiration Date: 02/15/2017    Order Specific Question:  If indicated for the ordered procedure, I authorize the administration of contrast media per Radiology protocol    Answer:  Yes    Order Specific Question:  Reason for Exam (SYMPTOM  OR DIAGNOSIS REQUIRED)    Answer:  left-sided headache, vertigo    Order Specific Question:  Preferred imaging location?    Answer:  [facility] (table limit-500 lbs)    Order Specific Question:  What is the patient's sedation requirement?    Answer:  No Sedation    Order Specific Question:  Does the patient have a pacemaker or implanted devices?    Answer:  Yes     Comments:  bilateral  hip replacements  . CBC no Diff  . CMP14 + Anion Gap   Follow Up: Return in about 2 weeks (around 12/31/2015).

## 2015-12-17 NOTE — Assessment & Plan Note (Addendum)
This nagging epigastric abdominal pain and questionable melena sounds most like gastritis versus peptic ulcer disease. I considered recurrent endometriosis as well; she has had a hysterectomy and oophorectomy in the early 2000s so she doesn't have endogenous hormones. However, she was started estradiol so this could be activating some residual endometriomas. Her last colonoscopy and endoscopy in 2013 showed single sessile polyps in both the rectum and stomach body, but I cannot find the pathology results.  Today I'll focus on treating her for gastritis because I think this is the most-likely cause of her epigastric pain. I've asked her to stop taking NSAIDs and take acetaminophen instead, and I've prescribed pantoprazole 40mg  twice daily for 2 weeks. I'm checking a CBC today to ensure she's not overtly anemic. If her pain is not improved in 2 weeks, I think we should consider asking her to try stopping the estradiol, and consider referral to gastroenterology to further evaluate and consider re-scoping her.  Addendum: Her CBC and CMP were normal. I tried calling her with the results but she didn't pick up nor have a voicemail.

## 2015-12-18 LAB — CMP14 + ANION GAP
ALBUMIN: 4.1 g/dL (ref 3.5–5.5)
ALK PHOS: 53 IU/L (ref 39–117)
ALT: 14 IU/L (ref 0–32)
ANION GAP: 15 mmol/L (ref 10.0–18.0)
AST: 21 IU/L (ref 0–40)
Albumin/Globulin Ratio: 1.3 (ref 1.2–2.2)
BILIRUBIN TOTAL: 0.6 mg/dL (ref 0.0–1.2)
BUN / CREAT RATIO: 7 — AB (ref 9–23)
BUN: 6 mg/dL (ref 6–24)
CHLORIDE: 101 mmol/L (ref 96–106)
CO2: 24 mmol/L (ref 18–29)
CREATININE: 0.83 mg/dL (ref 0.57–1.00)
Calcium: 9.7 mg/dL (ref 8.7–10.2)
GFR calc Af Amer: 101 mL/min/{1.73_m2} (ref >59)
GFR calc non Af Amer: 88 mL/min/{1.73_m2} (ref >59)
GLUCOSE: 101 mg/dL — AB (ref 65–99)
Globulin, Total: 3.2 g/dL (ref 1.5–4.5)
Potassium: 4.2 mmol/L (ref 3.5–5.2)
Sodium: 140 mmol/L (ref 134–144)
TOTAL PROTEIN: 7.3 g/dL (ref 6.0–8.5)

## 2015-12-18 LAB — CBC
HEMATOCRIT: 37.2 % (ref 34.0–46.6)
HEMOGLOBIN: 12.1 g/dL (ref 11.1–15.9)
MCH: 26.8 pg (ref 26.6–33.0)
MCHC: 32.5 g/dL (ref 31.5–35.7)
MCV: 82 fL (ref 79–97)
Platelets: 338 10*3/uL (ref 150–379)
RBC: 4.52 x10E6/uL (ref 3.77–5.28)
RDW: 14.9 % (ref 12.3–15.4)
WBC: 6.9 10*3/uL (ref 3.4–10.8)

## 2015-12-20 ENCOUNTER — Encounter: Payer: Self-pay | Admitting: Internal Medicine

## 2015-12-20 ENCOUNTER — Ambulatory Visit (INDEPENDENT_AMBULATORY_CARE_PROVIDER_SITE_OTHER): Payer: BLUE CROSS/BLUE SHIELD | Admitting: Internal Medicine

## 2015-12-20 VITALS — BP 149/95 | HR 58 | Temp 97.4°F | Ht 71.0 in | Wt 183.3 lb

## 2015-12-20 DIAGNOSIS — I10 Essential (primary) hypertension: Secondary | ICD-10-CM | POA: Diagnosis not present

## 2015-12-20 DIAGNOSIS — R1013 Epigastric pain: Secondary | ICD-10-CM | POA: Diagnosis not present

## 2015-12-20 DIAGNOSIS — R51 Headache: Secondary | ICD-10-CM

## 2015-12-20 DIAGNOSIS — G4489 Other headache syndrome: Secondary | ICD-10-CM

## 2015-12-20 NOTE — Assessment & Plan Note (Addendum)
Her vague epigastric abdominal pain and bloating is perplexing to me. Originally, I thought this may have been gastritis, but it is not improved since stopping the NSAIDs and starting pantoprazole. If she still had her ovaries, ovarian cancer would be at the top of my differential, but these were removed along with her uterus for endometriosis years ago. She is on estrogen replacement therapy, so I suppose it is feasible she has residual endometriomas, so I have ordered an abdominal and pelvis CT scan today to look for endometriomas or some sort of anatomic explanation. Her last colonoscopy in 2013 showed a sessile polyp and recommended another colonoscopy in 2023. If her abdominal CT is negative, I will refer her to gastroenterology for further evaluation.

## 2015-12-20 NOTE — Assessment & Plan Note (Signed)
She continues to be hypertensive today at 149/95, so I have asked her to increase her amlodipine from 5-10 mg daily. She will follow-up in one month for another blood pressure recheck. Should she continue to be above her goal of <140/90, I think adding HCTZ 12.5mg  daily would be a good next step as she is African-American.

## 2015-12-20 NOTE — Progress Notes (Signed)
Internal Medicine Clinic Attending  Case discussed with Dr. Flores at the time of the visit.  We reviewed the resident's history and exam and pertinent patient test results.  I agree with the assessment, diagnosis, and plan of care documented in the resident's note. 

## 2015-12-20 NOTE — Progress Notes (Addendum)
Patient ID: HEATHERANN ADCOCK, female   DOB: 1975-08-24, 40 y.o.   MRN: [id number] [facility] Subjective:   Patient ID: Royce Pawlowicz Allen-Bruce female   DOB: 1975-12-02 40 y.o.   MRN: [id number]  HPI: Ms.Paisly R Allen-Bruce is a 40 y.o. female with a history of endometriosis status-post hysterectomy and bilateral oophorectomy on hormone replacement therapy, Migraine headaches, and hypertension here for follow-up of epigastric abdominal pain, headaches, and hypertension.  Epigastric abdominal pain: For the last 3 months, she has had a vague epigastric abdominal discomfort that radiates to her left flank. This pain is constant and rated as a 9 out of 10. Nothing makes the pain better or worse. She denies any change in her bowel movements; no melena, hematochezia, or diarrhea. She further denies any fevers, arthralgias, rash.  Headache: For the last 3 months, she has also been having a very strange headache. She describes a sharp, electric sensation shooting from the top left side of her head down to her face. During these episodes, her vision becomes blurred and she feels slight vertigo and off balance. There is no associated aura, sensitivity to light, or sensitivity to sound. She denies any eye pain, tingling in any other extremities, urinary incontinence, or family history of multiple sclerosis. She denies being bitten by any ticks, fevers, rash, or arthralgias.  Hypertension: She has not been checking her blood pressure at home. She is still taking amlodipine 5 mg daily.  She is not smoking and I have reviewed her medications with her today.  Review of Systems  Constitutional: Negative for fever, chills, weight loss and malaise/fatigue.  HENT: Negative for ear pain, hearing loss and tinnitus.   Eyes: Positive for blurred vision. Negative for double vision, photophobia, pain and redness.  Gastrointestinal: Positive for abdominal pain. Negative for heartburn, nausea,  vomiting, diarrhea, constipation, blood in stool and melena.  Genitourinary: Positive for flank pain. Negative for dysuria, urgency and hematuria.  Musculoskeletal: Negative for myalgias, back pain, joint pain and neck pain.  Skin: Negative for rash.  Neurological: Positive for dizziness and headaches. Negative for tingling, tremors, sensory change, focal weakness, loss of consciousness and weakness.  Psychiatric/Behavioral: Negative for depression. The patient is not nervous/anxious.    Objective:  Physical Exam: Filed Vitals:   12/20/15 1008  BP: 149/95  Pulse: 58  Temp: 97.4 F (36.3 C)  TempSrc: Oral  Height: 5\' 11"  (1.803 m)  Weight: 183 lb 4.8 oz (83.144 kg)  SpO2: 100%   General: friendly lady resting in chair comfortably, appropriately conversational HEENT: no scleral icterus, extra-ocular muscles intact, oropharynx without lesions Cardiac: regular rate and rhythm, no rubs, murmurs or gallops Pulm: breathing well, clear to auscultation bilaterally Abd: bowel sounds normal, soft, nondistended, non-tender Ext: warm and well perfused, without pedal edema Lymph: no cervical or supraclavicular lymphadenopathy Skin: no rash, hair, or nail changes Neuro: alert and oriented X3, cranial nerves II-XII grossly intact, strength 5/5 throughout, sensation intact to light touch throughout  Assessment & Plan:  Case discussed with Dr. Dareen Piano  Abdominal pain, epigastric Her vague epigastric abdominal pain and bloating is perplexing to me. Originally, I thought this may have been gastritis, but it is not improved since stopping the NSAIDs and starting pantoprazole. If she still had her ovaries, ovarian cancer would be at the top of my differential, but these were removed along with her uterus for endometriosis years ago. She is on estrogen replacement therapy, so I suppose it is feasible she has residual  endometriomas, so I have ordered an abdominal and pelvis CT scan today to look for  endometriomas or some sort of anatomic explanation. Her last colonoscopy in 2013 showed a sessile polyp and recommended another colonoscopy in 2023. If her abdominal CT is negative, I will refer her to gastroenterology for further evaluation.  Headache Her sharp, electric, left posterior headache is also odd to me. It sounds like trigeminal neuralgia but obvious is not in the trigeminal distrubution. She had followed with a neurologist for migraines but she insists this sensation is quite different. Given her co-occuring blurred vision and vertigo, I have scheduled her for a brain MRI. Her last MRI in 2016 showed some nonspecific T2 hyperintense cortical white matter lesions; these were not classic for multiple sclerosis. This doesn't sound like Lhermitte's phenomenon, she does not have any urinary symptoms or optic neuritis, so I doubt this is multiple sclerosis, but I think an MRI is prudent to further clarify her picture. I've also asked her to schedule a follow-up upon with her neurologist for an expert evaluation.  Addendum: Her brain MRI showed left-sided chronic cystic mastoiditis. I was not expecting this finding, and I have a very hard time correlating her clinical picture to this disease process as she does not complain of pain behind the posterior ear, there is no objective evidence of infection, and she denies any fevers. She does complain of vertigo, suggestive of pathology nearby in the inner ear, however. There is very little literature on chronic mastoiditis.  I think she'll need ENT evaluation for further management. I don't think she requires anti-Pseudomonal antibiotic coverage urgently, as she is non-toxic appearing, this is a chronic process, and frankly I don't know if antibiotics are adequate to treat this condition. I've tried calling all of the numbers listed in her chart numerous times to reach but I have not been able to reach her. I've placed a referral and asked out nursing staff  to notify her of the results should she call the clinic again.  Hypertension She continues to be hypertensive today at 149/95, so I have asked her to increase her amlodipine from 5-10 mg daily. She will follow-up in one month for another blood pressure recheck. Should she continue to be above her goal of <140/90, I think adding HCTZ 12.5mg  daily would be a good next step as she is African-American.   Other Orders Orders Placed This Encounter  Procedures  . CT Abdomen Pelvis W Wo Contrast    Standing Status: Future     Number of Occurrences:      Standing Expiration Date: 03/21/2017    Order Specific Question:  If indicated for the ordered procedure, I authorize the administration of contrast media per Radiology protocol    Answer:  Yes    Order Specific Question:  Reason for Exam (SYMPTOM  OR DIAGNOSIS REQUIRED)    Answer:  vague abdominal discomfort, bloating, history of hysterectomy and oophorectomy for endometriosis, on estradiol. evaluate for endometriomas or other causes    Order Specific Question:  Is the patient pregnant?    Answer:  No    Order Specific Question:  Preferred imaging location?    Answer:  [facility]  . Ambulatory referral to ENT    Referral Priority:  Routine    Referral Type:  Consultation    Referral Reason:  Specialty Services Required    Requested Specialty:  Otolaryngology    Number of Visits Requested:  1   Follow Up: Return in  about 4 weeks (around 01/17/2016) for follow up high blood pressure.

## 2015-12-20 NOTE — Assessment & Plan Note (Addendum)
Her sharp, electric, left posterior headache is also odd to me. It sounds like trigeminal neuralgia but obvious is not in the trigeminal distrubution. She had followed with a neurologist for migraines but she insists this sensation is quite different. Given her co-occuring blurred vision and vertigo, I have scheduled her for a brain MRI. Her last MRI in 2016 showed some nonspecific T2 hyperintense cortical white matter lesions; these were not classic for multiple sclerosis. This doesn't sound like Lhermitte's phenomenon, she does not have any urinary symptoms or optic neuritis, so I doubt this is multiple sclerosis, but I think an MRI is prudent to further clarify her picture. I've also asked her to schedule a follow-up upon with her neurologist for an expert evaluation.  Addendum: Her brain MRI showed left-sided chronic cystic mastoiditis. I was not expecting this finding, and I have a very hard time correlating her clinical picture to this disease process as she does not complain of pain behind the posterior ear, there is no objective evidence of infection, and she denies any fevers. She does complain of vertigo, suggestive of pathology nearby in the inner ear, however. There is very little literature on chronic mastoiditis.  I think she'll need ENT evaluation for further management. I don't think she requires anti-Pseudomonal antibiotic coverage urgently, as she is non-toxic appearing, this is a chronic process, and frankly I don't know if antibiotics are adequate to treat this condition. I've tried calling all of the numbers listed in her chart numerous times to reach but I have not been able to reach her. I've placed a referral and asked out nursing staff to notify her of the results should she call the clinic again.

## 2015-12-21 ENCOUNTER — Ambulatory Visit (HOSPITAL_COMMUNITY): Payer: BLUE CROSS/BLUE SHIELD

## 2015-12-25 ENCOUNTER — Ambulatory Visit (HOSPITAL_COMMUNITY)
Admission: RE | Admit: 2015-12-25 | Discharge: 2015-12-25 | Disposition: A | Discharge status: Home / Self Care | Payer: BLUE CROSS/BLUE SHIELD | Source: Ambulatory Visit | Attending: Internal Medicine | Admitting: Internal Medicine

## 2015-12-25 DIAGNOSIS — H7012 Chronic mastoiditis, left ear: Secondary | ICD-10-CM | POA: Insufficient documentation

## 2015-12-25 DIAGNOSIS — R519 Headache, unspecified: Secondary | ICD-10-CM

## 2015-12-25 DIAGNOSIS — R51 Headache: Secondary | ICD-10-CM | POA: Diagnosis not present

## 2015-12-25 MED ORDER — GADOBENATE DIMEGLUMINE 529 MG/ML IV SOLN
15.0000 mL | Freq: Once | INTRAVENOUS | Status: AC
Start: 2015-12-25 — End: 2015-12-25
  Administered 2015-12-25: 15 mL via INTRAVENOUS

## 2015-12-26 DIAGNOSIS — Z90710 Acquired absence of both cervix and uterus: Secondary | ICD-10-CM | POA: Diagnosis not present

## 2015-12-26 DIAGNOSIS — R102 Pelvic and perineal pain: Secondary | ICD-10-CM | POA: Diagnosis not present

## 2015-12-26 DIAGNOSIS — Z01419 Encounter for gynecological examination (general) (routine) without abnormal findings: Secondary | ICD-10-CM | POA: Diagnosis not present

## 2015-12-26 DIAGNOSIS — N951 Menopausal and female climacteric states: Secondary | ICD-10-CM | POA: Diagnosis not present

## 2015-12-27 ENCOUNTER — Telehealth: Payer: Self-pay

## 2015-12-27 NOTE — Telephone Encounter (Signed)
Pt requesting xray result. Please call pt back.

## 2015-12-30 NOTE — Addendum Note (Signed)
Addended by: Carlota Raspberry on: 12/30/2015 02:30 PM   Modules accepted: Orders

## 2015-12-31 NOTE — Telephone Encounter (Signed)
Pt calling about results again

## 2015-12-31 NOTE — Telephone Encounter (Signed)
Spoke with patient, advised her of results and referral to ENT per Dr. Melburn Hake note.  No further questions, she will wait to hear from ENT

## 2016-01-08 ENCOUNTER — Ambulatory Visit: Payer: BLUE CROSS/BLUE SHIELD | Admitting: Internal Medicine

## 2016-01-09 ENCOUNTER — Telehealth: Payer: Self-pay | Admitting: Internal Medicine

## 2016-01-09 NOTE — Telephone Encounter (Signed)
She is having numb in hands

## 2016-01-09 NOTE — Telephone Encounter (Signed)
Called no answer

## 2016-01-09 NOTE — Telephone Encounter (Signed)
i have called both ph# 3 times, lm's and gotten no answer

## 2016-01-10 ENCOUNTER — Ambulatory Visit (INDEPENDENT_AMBULATORY_CARE_PROVIDER_SITE_OTHER): Payer: BLUE CROSS/BLUE SHIELD | Admitting: Otolaryngology

## 2016-01-10 DIAGNOSIS — R51 Headache: Secondary | ICD-10-CM | POA: Diagnosis not present

## 2016-01-10 DIAGNOSIS — H903 Sensorineural hearing loss, bilateral: Secondary | ICD-10-CM

## 2016-01-17 ENCOUNTER — Ambulatory Visit (INDEPENDENT_AMBULATORY_CARE_PROVIDER_SITE_OTHER): Payer: BLUE CROSS/BLUE SHIELD | Admitting: Internal Medicine

## 2016-01-17 ENCOUNTER — Encounter: Payer: Self-pay | Admitting: Internal Medicine

## 2016-01-17 VITALS — BP 148/85 | HR 58 | Temp 98.3°F | Ht 71.0 in | Wt 188.1 lb

## 2016-01-17 DIAGNOSIS — I10 Essential (primary) hypertension: Secondary | ICD-10-CM

## 2016-01-17 MED ORDER — AMLODIPINE BESYLATE 5 MG PO TABS: 5.0000 mg | ORAL_TABLET | Freq: Every day | ORAL | Status: DC

## 2016-01-17 MED ORDER — HYDROCHLOROTHIAZIDE 12.5 MG PO TABS: 12.5000 mg | ORAL_TABLET | Freq: Every day | ORAL | Status: DC

## 2016-01-17 NOTE — Patient Instructions (Signed)
Start taking hydrochlorothiazide 12.5mg  daily Reduce amlodipine from 10 to 5 mg to help with your ankle swelling.  Follow up in 1 month Keep checking your BP at home.  Bring your log with you.

## 2016-01-17 NOTE — Assessment & Plan Note (Addendum)
Filed Vitals:   01/17/16 0912  BP: 148/85  Pulse: 58  Temp: 98.3 F (36.8 C)   BP remains elevated. Currently on amlodipine 10mg  daily. Having swelling on this. Did ok with 5 mg amlodipine daily. Home BP reading is better in 130's usually.  - will reduce amlodipine to 5 mg daily to help avoid swelling - start hctz 12.5mg  daily. - f/up in 1 month. Will check BMET next visit.  - bring home BP log.

## 2016-01-17 NOTE — Progress Notes (Signed)
   Subjective:    Patient ID: [name], female    DOB: 1976-06-12, 40 y.o.   MRN: [id number]  HPI  34 F with hx of chronic migraine, endometriosis, HTN, here for HTN f/up.   HTN: amlodipine was increased from 5 to 10 mg daily. Having b/l ankle swelling since going up, mainly left. Has tried lisinopril in the past and had throat irriatation. Never tried hctz. Denies any chest pain/sob. Has chronic headache. No dizziness. Home BP is better in 130's. Here BP is 148/85.   Review of Systems  Constitutional: Negative for fever and chills.  Respiratory: Negative for cough, chest tightness and shortness of breath.   Cardiovascular: Positive for leg swelling. Negative for chest pain and palpitations.  Neurological: Negative for dizziness and numbness.       Objective:   Physical Exam  Constitutional: She is oriented to person, place, and time. She appears well-developed and well-nourished. No distress.  HENT:  Head: Normocephalic and atraumatic.  Pulmonary/Chest: Effort normal and breath sounds normal. No respiratory distress. She has no wheezes.  Musculoskeletal: Normal range of motion. She exhibits no edema or tenderness.  Neurological: She is alert and oriented to person, place, and time. No cranial nerve deficit.  Skin: She is not diaphoretic.    Filed Vitals:   01/17/16 0912  BP: 148/85  Pulse: 58  Temp: 98.3 F (36.8 C)         Assessment & Plan:  See problem based a&p.

## 2016-01-18 NOTE — Progress Notes (Signed)
Internal Medicine Clinic Attending  Case discussed with Dr. Ahmed at the time of the visit.  We reviewed the resident's history and exam and pertinent patient test results.  I agree with the assessment, diagnosis, and plan of care documented in the resident's note. 

## 2016-01-23 ENCOUNTER — Encounter: Payer: Self-pay | Admitting: Internal Medicine

## 2016-01-23 ENCOUNTER — Other Ambulatory Visit: Payer: Self-pay | Admitting: Internal Medicine

## 2016-01-23 NOTE — Addendum Note (Signed)
Addended by: Carlota Raspberry on: 01/23/2016 08:00 PM   Modules accepted: Orders

## 2016-01-24 ENCOUNTER — Ambulatory Visit (HOSPITAL_COMMUNITY): Admission: RE | Admit: 2016-01-24 | Payer: BLUE CROSS/BLUE SHIELD | Source: Ambulatory Visit

## 2016-01-26 ENCOUNTER — Other Ambulatory Visit: Payer: Self-pay | Admitting: Internal Medicine

## 2016-01-26 DIAGNOSIS — R1013 Epigastric pain: Secondary | ICD-10-CM

## 2016-01-30 ENCOUNTER — Ambulatory Visit (HOSPITAL_COMMUNITY): Payer: BLUE CROSS/BLUE SHIELD

## 2016-01-30 ENCOUNTER — Ambulatory Visit (HOSPITAL_COMMUNITY)
Admission: RE | Admit: 2016-01-30 | Discharge: 2016-01-30 | Disposition: A | Discharge status: Home / Self Care | Payer: BLUE CROSS/BLUE SHIELD | Source: Ambulatory Visit | Attending: Internal Medicine | Admitting: Internal Medicine

## 2016-01-30 DIAGNOSIS — M5136 Other intervertebral disc degeneration, lumbar region: Secondary | ICD-10-CM | POA: Insufficient documentation

## 2016-01-30 DIAGNOSIS — Z9889 Other specified postprocedural states: Secondary | ICD-10-CM | POA: Diagnosis not present

## 2016-01-30 DIAGNOSIS — R109 Unspecified abdominal pain: Secondary | ICD-10-CM | POA: Diagnosis not present

## 2016-01-30 DIAGNOSIS — K5730 Diverticulosis of large intestine without perforation or abscess without bleeding: Secondary | ICD-10-CM | POA: Diagnosis not present

## 2016-01-30 DIAGNOSIS — R1013 Epigastric pain: Secondary | ICD-10-CM | POA: Diagnosis not present

## 2016-01-30 MED ORDER — IOPAMIDOL (ISOVUE-300) INJECTION 61%
100.0000 mL | Freq: Once | INTRAVENOUS | Status: AC | PRN
  Administered 2016-01-30: 100 mL via INTRAVENOUS

## 2016-02-04 ENCOUNTER — Telehealth: Payer: Self-pay | Admitting: *Deleted

## 2016-02-04 NOTE — Telephone Encounter (Signed)
Patient wanting to know the results of her abdominal CT results. Thanks!

## 2016-02-07 NOTE — Telephone Encounter (Signed)
Could you have her make an appointment to come in to discuss?  There is nothing concerning, but I would like to do an exam to correlate some findings that were noted.  If she already has an appointment, have her move it up to an appointment with me in early July.  Thanks!

## 2016-02-11 NOTE — Telephone Encounter (Signed)
Appt. 7/12 is this early enough or do you want to me to have her come in sooner?

## 2016-02-11 NOTE — Telephone Encounter (Signed)
7/12 works.  Thanks!

## 2016-02-14 ENCOUNTER — Telehealth: Payer: Self-pay

## 2016-02-14 NOTE — Telephone Encounter (Signed)
Requesting CT result. Please call back.

## 2016-02-15 NOTE — Telephone Encounter (Signed)
No answer, no vmail 

## 2016-02-15 NOTE — Telephone Encounter (Signed)
Please see previous telephone notes.  I would like to discuss the results with her in person and get a better understanding of her pain.  She has an appointment on 7/12 where I will do this.  Thanks

## 2016-02-18 NOTE — Telephone Encounter (Signed)
No answer, no VM

## 2016-02-19 DIAGNOSIS — Z049 Encounter for examination and observation for unspecified reason: Secondary | ICD-10-CM | POA: Diagnosis not present

## 2016-02-19 DIAGNOSIS — G4485 Primary stabbing headache: Secondary | ICD-10-CM | POA: Diagnosis not present

## 2016-02-19 DIAGNOSIS — G43719 Chronic migraine without aura, intractable, without status migrainosus: Secondary | ICD-10-CM | POA: Diagnosis not present

## 2016-02-19 NOTE — Telephone Encounter (Signed)
No answer, no VM

## 2016-02-20 ENCOUNTER — Ambulatory Visit: Payer: BLUE CROSS/BLUE SHIELD | Admitting: Internal Medicine

## 2016-02-26 DIAGNOSIS — F331 Major depressive disorder, recurrent, moderate: Secondary | ICD-10-CM | POA: Diagnosis not present

## 2016-03-04 DIAGNOSIS — F331 Major depressive disorder, recurrent, moderate: Secondary | ICD-10-CM | POA: Diagnosis not present

## 2016-03-11 DIAGNOSIS — F331 Major depressive disorder, recurrent, moderate: Secondary | ICD-10-CM | POA: Diagnosis not present

## 2016-03-18 DIAGNOSIS — F331 Major depressive disorder, recurrent, moderate: Secondary | ICD-10-CM | POA: Diagnosis not present

## 2016-03-27 DIAGNOSIS — F331 Major depressive disorder, recurrent, moderate: Secondary | ICD-10-CM | POA: Diagnosis not present

## 2016-04-10 DIAGNOSIS — F331 Major depressive disorder, recurrent, moderate: Secondary | ICD-10-CM | POA: Diagnosis not present

## 2016-04-22 DIAGNOSIS — F331 Major depressive disorder, recurrent, moderate: Secondary | ICD-10-CM | POA: Diagnosis not present

## 2016-04-30 DIAGNOSIS — F331 Major depressive disorder, recurrent, moderate: Secondary | ICD-10-CM | POA: Diagnosis not present

## 2016-05-07 DIAGNOSIS — F331 Major depressive disorder, recurrent, moderate: Secondary | ICD-10-CM | POA: Diagnosis not present

## 2016-05-08 ENCOUNTER — Encounter: Payer: Self-pay | Admitting: Family Medicine

## 2016-05-08 ENCOUNTER — Ambulatory Visit (INDEPENDENT_AMBULATORY_CARE_PROVIDER_SITE_OTHER): Payer: BLUE CROSS/BLUE SHIELD | Admitting: Family Medicine

## 2016-05-08 VITALS — BP 140/84 | HR 68 | Wt 185.2 lb

## 2016-05-08 DIAGNOSIS — Z7189 Other specified counseling: Secondary | ICD-10-CM

## 2016-05-08 DIAGNOSIS — K0889 Other specified disorders of teeth and supporting structures: Secondary | ICD-10-CM

## 2016-05-08 DIAGNOSIS — Z7689 Persons encountering health services in other specified circumstances: Secondary | ICD-10-CM

## 2016-05-08 DIAGNOSIS — Z008 Encounter for other general examination: Secondary | ICD-10-CM

## 2016-05-08 DIAGNOSIS — I10 Essential (primary) hypertension: Secondary | ICD-10-CM | POA: Diagnosis not present

## 2016-05-08 DIAGNOSIS — E663 Overweight: Secondary | ICD-10-CM | POA: Diagnosis not present

## 2016-05-08 NOTE — Patient Instructions (Addendum)
Your blood pressure appears to be well managed on your current medication. If you would still like to try switching to a different medication let me know.  I recommend trying My Fitness Pal app to track daily caloric intake.  I am referring you to the medical nutritionist for further evaluation. Call your insurance company BEFORE you go to find out your co-pay if any.  You do not appear to have a dental abscess or infection at this point. Call if you are having worsening symptoms.  Try aleve or ibuprofen for pain, salt water gargles may help as well.  Schedule with a dentist soon.  Follow up when you are due for a physical or sooner if needed.   DASH Eating Plan DASH stands for "Dietary Approaches to Stop Hypertension." The DASH eating plan is a healthy eating plan that has been shown to reduce high blood pressure (hypertension). Additional health benefits may include reducing the risk of type 2 diabetes mellitus, heart disease, and stroke. The DASH eating plan may also help with weight loss. WHAT DO I NEED TO KNOW ABOUT THE DASH EATING PLAN? For the DASH eating plan, you will follow these general guidelines:  Choose foods with a percent daily value for sodium of less than 5% (as listed on the food label).  Use salt-free seasonings or herbs instead of table salt or sea salt.  Check with your health care provider or pharmacist before using salt substitutes.  Eat lower-sodium products, often labeled as "lower sodium" or "no salt added."  Eat fresh foods.  Eat more vegetables, fruits, and low-fat dairy products.  Choose whole grains. Look for the word "whole" as the first word in the ingredient list.  Choose fish and skinless chicken or [place] more often than red meat. Limit fish, poultry, and meat to 6 oz (170 g) each day.  Limit sweets, desserts, sugars, and sugary drinks.  Choose heart-healthy fats.  Limit cheese to 1 oz (28 g) per day.  Eat more home-cooked food and less  restaurant, buffet, and fast food.  Limit fried foods.  Cook foods using methods other than frying.  Limit canned vegetables. If you do use them, rinse them well to decrease the sodium.  When eating at a restaurant, ask that your food be prepared with less salt, or no salt if possible. WHAT FOODS CAN I EAT? Seek help from a dietitian for individual calorie needs. Grains Whole grain or whole wheat bread. Brown rice. Whole grain or whole wheat pasta. Quinoa, bulgur, and whole grain cereals. Low-sodium cereals. Corn or whole wheat flour tortillas. Whole grain cornbread. Whole grain crackers. Low-sodium crackers. Vegetables Fresh or frozen vegetables (raw, steamed, roasted, or grilled). Low-sodium or reduced-sodium tomato and vegetable juices. Low-sodium or reduced-sodium tomato sauce and paste. Low-sodium or reduced-sodium canned vegetables.  Fruits All fresh, canned (in natural juice), or frozen fruits. Meat and Other Protein Products Ground beef (85% or leaner), grass-fed beef, or beef trimmed of fat. Skinless chicken or [place]. Ground chicken or [place]. Pork trimmed of fat. All fish and seafood. Eggs. Dried beans, peas, or lentils. Unsalted nuts and seeds. Unsalted canned beans. Dairy Low-fat dairy products, such as skim or 1% milk, 2% or reduced-fat cheeses, low-fat ricotta or cottage cheese, or plain low-fat yogurt. Low-sodium or reduced-sodium cheeses. Fats and Oils Tub margarines without trans fats. Light or reduced-fat mayonnaise and salad dressings (reduced sodium). Avocado. Safflower, olive, or canola oils. Natural peanut or almond butter. Other Unsalted popcorn and pretzels. The items listed  above may not be a complete list of recommended foods or beverages. Contact your dietitian for more options. WHAT FOODS ARE NOT RECOMMENDED? Grains White bread. White pasta. White rice. Refined cornbread. Bagels and croissants. Crackers that contain trans fat. Vegetables Creamed or fried  vegetables. Vegetables in a cheese sauce. Regular canned vegetables. Regular canned tomato sauce and paste. Regular tomato and vegetable juices. Fruits Dried fruits. Canned fruit in light or heavy syrup. Fruit juice. Meat and Other Protein Products Fatty cuts of meat. Ribs, chicken wings, bacon, sausage, bologna, salami, chitterlings, fatback, hot dogs, bratwurst, and packaged luncheon meats. Salted nuts and seeds. Canned beans with salt. Dairy Whole or 2% milk, cream, half-and-half, and cream cheese. Whole-fat or sweetened yogurt. Full-fat cheeses or blue cheese. Nondairy creamers and whipped toppings. Processed cheese, cheese spreads, or cheese curds. Condiments Onion and garlic salt, seasoned salt, table salt, and sea salt. Canned and packaged gravies. Worcestershire sauce. Tartar sauce. Barbecue sauce. Teriyaki sauce. Soy sauce, including reduced sodium. Steak sauce. Fish sauce. Oyster sauce. Cocktail sauce. Horseradish. Ketchup and mustard. Meat flavorings and tenderizers. Bouillon cubes. Hot sauce. Tabasco sauce. Marinades. Taco seasonings. Relishes. Fats and Oils Butter, stick margarine, lard, shortening, ghee, and bacon fat. Coconut, palm kernel, or palm oils. Regular salad dressings. Other Pickles and olives. Salted popcorn and pretzels. The items listed above may not be a complete list of foods and beverages to avoid. Contact your dietitian for more information. WHERE CAN I FIND MORE INFORMATION? National Heart, Lung, and Blood Institute: travelstabloid.com   This information is not intended to replace advice given to you by your health care provider. Make sure you discuss any questions you have with your health care provider.   Document Released: 07/17/2011 Document Revised: 08/18/2014 Document Reviewed: 06/01/2013 Elsevier Interactive Patient Education [employer].

## 2016-05-08 NOTE — Progress Notes (Signed)
Subjective:    Patient ID: [name], female    DOB: [birth year], 40 y.o.   MRN: [id number]  HPI Chief Complaint  Patient presents with  . new pt    new pt bp issue.-    She is new to the practice and here to establish care and for concerns regarding blood pressures. States she thinks her amlodipine is making her gain weight. She states she thinks she would like to switch blood pressure medication.  Diagnosed in 2015-2016 with HTN States she checks her blood pressure at home daily and her readings are usually around 117/79, <120/80 regularly. States she has some unilateral ankle swelling after being on her feet for several hours to the ankle that has had reconstruction and hardware. States the swelling goes away after being off her feet.   States she has tried other medications in the past including Lisinopril-she could not tolerate due to cough.  HCTZ- was prescribed this but states she did not want to take this because she heard bad things about it. She is not sure which medication she would like to switch to and would like my advice.   States weight gain has been gradual, she is not sure how many calories she is consuming daily but thinks she is only eating a few hundred. States she does not know what she should be eating. States she cannot lose weight like she used to. Reports daily exercise for 1 hour per day on elliptical or treadmill.   Also complains of filling to her left back tooth that broke and has been causing her to have left sided facial pain. States she thinks it was infected within the past week and she took 3 days of penicillin that she had at home and infection seems to have cleared up. She continues to have pain with chewing to that tooth and to her left cheek and jaw. States initially she had swelling to her left jaw but that has improved also.  She states she does not have a dentist right now who is in her network. Plans to call and try to find someone who takes  her insurance but has not done this yet.   Denies fever, chills, ear pain, vision changes, sore throat, numbness, chest pain, cough, DOE, GI or GU symptoms.    Previous medical care: [facility] outpatient  Last CPE: last October  Other providers: Eye doctor- goes annually  Dr. Paulino Rily in [facility]. GI in [place]- saw them for abdominal pain.   Surgeries: hysterectomy at age 53 due to fibroids. Ovaries removed.    Family history: mother passed away at age 67, unknown. 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  Denies smoking, drinking alcohol, drug use  Diet: only eats 1-2 meals per day. Does not know how many calories.  Excerise: reports daily exercise.   Reviewed allergies, medications, past medical, surgical, family, and social history.  Review of Systems Pertinent positives and negatives in the history of present illness.     Objective:   Physical Exam  Constitutional: She is oriented to person, place, and time. She appears well-developed and well-nourished. She does not have a sickly appearance. No distress.  HENT:  Head: Atraumatic.  Right Ear: Tympanic membrane and ear canal normal.  Left Ear: Tympanic membrane and ear canal normal.  Nose: Nose normal. Right sinus exhibits no maxillary sinus tenderness and no frontal sinus tenderness. Left  sinus exhibits no maxillary sinus tenderness and no frontal sinus tenderness.  Mouth/Throat: Uvula is midline, oropharynx is clear and moist and mucous membranes are normal. Dental caries present. No dental abscesses.    Broken tooth #17. No signs of infection. TMJ and mastoid non tender.   Neck: Normal range of motion. Neck supple.  Cardiovascular: Normal rate, regular rhythm and intact distal pulses.  Exam reveals no gallop and no friction rub.   No murmur heard. Pulmonary/Chest: Effort normal and breath sounds normal.  Lymphadenopathy:       Head (right side): No  submandibular, no preauricular, no posterior auricular and no occipital adenopathy present.       Head (left side): No submandibular, no preauricular, no posterior auricular and no occipital adenopathy present.    She has no cervical adenopathy.  Neurological: She is alert and oriented to person, place, and time. Gait normal.  Skin: Skin is warm and dry. No rash noted. No pallor.  Psychiatric: She has a normal mood and affect. Her speech is normal and behavior is normal. Thought content normal.   BP 140/84   Pulse 68   Wt 185 lb 3.2 oz (84 kg)   BMI 25.83 kg/m      Assessment & Plan:  Essential hypertension - Plan: Amb ref to Medical Nutrition Therapy-MNT  Encounter to establish care  Overweight (BMI 25.0-29.9) - Plan: Amb ref to Medical Nutrition Therapy-MNT  Nutritional assessment - Plan: Amb ref to Medical Nutrition Therapy-MNT  Pain, dental  Discussed that her blood pressure is well controlled on current medication per patient home readings. Unilateral ankle swelling appears to be related to history of surgery and hardware and not consistent with edema related to amlodipine. Discussed that I will switch her medication if she is adamant that she wants to change but that the medication does appear to be working well for her. She will let me know if she decides to switch after all. Information on DASH diet provided and she will continue keeping an eye on her blood pressures at home.  Discussed that her BMI is actually very close to being within normal range. Recommend that she try using a free app such as My Fitness Pal to track her calories and counseled that eating too few calories is not helpful for weight loss. She would like to be referred for nutritional counseling. Referral made but I did make her aware that she should check with her insurance in order to make sure of her co-pay prior to going to her appointment.  No signs of dental abscess or infection today. She did take a short  course of PCN at home prior to coming in today and if she had an infection it may have cleared it up. Advised her to call her insurance company and find a Database administrator in her network to see in the next few days. She will call if she notices any worsening symptoms. She does not appear to need further antibiotic treatment at this time.  Patient verbalized understanding and is pleased with the plan of care.

## 2016-05-09 ENCOUNTER — Telehealth: Payer: Self-pay | Admitting: Family Medicine

## 2016-05-09 ENCOUNTER — Other Ambulatory Visit: Payer: Self-pay | Admitting: Family Medicine

## 2016-05-09 MED ORDER — AMOXICILLIN 500 MG PO CAPS: 500.0000 mg | ORAL_CAPSULE | Freq: Two times a day (BID) | ORAL | 0 refills | Status: DC

## 2016-05-09 NOTE — Telephone Encounter (Signed)
Left message for pt

## 2016-05-09 NOTE — Addendum Note (Signed)
Addended by: Girtha Rm on: 05/09/2016 08:26 PM   Modules accepted: Miquel Dunn

## 2016-05-09 NOTE — Progress Notes (Signed)
This encounter was created in error - please disregard.

## 2016-05-09 NOTE — Telephone Encounter (Signed)
Patient stopped by, she feels that she does need antibiotics called in  Having pain , she states there is a spot on other tooth     [facility]

## 2016-05-09 NOTE — Telephone Encounter (Signed)
Please let her know that I sent in the prescription. Please make sure she is scheduling with a dentist.

## 2016-05-09 NOTE — Progress Notes (Signed)
   Subjective:    Patient ID: [name], female    DOB: 1976/04/01, 40 y.o.   MRN: [id number]  HPI    Review of Systems     Objective:   Physical Exam        Assessment & Plan:

## 2016-05-14 ENCOUNTER — Encounter: Payer: Self-pay | Admitting: Internal Medicine

## 2016-05-14 DIAGNOSIS — F331 Major depressive disorder, recurrent, moderate: Secondary | ICD-10-CM | POA: Diagnosis not present

## 2016-05-27 ENCOUNTER — Ambulatory Visit: Payer: Medicare Other | Admitting: Family Medicine

## 2016-05-28 DIAGNOSIS — F331 Major depressive disorder, recurrent, moderate: Secondary | ICD-10-CM | POA: Diagnosis not present

## 2016-06-04 DIAGNOSIS — F331 Major depressive disorder, recurrent, moderate: Secondary | ICD-10-CM | POA: Diagnosis not present

## 2016-06-05 ENCOUNTER — Encounter: Payer: Self-pay | Admitting: Family Medicine

## 2016-06-05 ENCOUNTER — Ambulatory Visit (INDEPENDENT_AMBULATORY_CARE_PROVIDER_SITE_OTHER): Payer: BLUE CROSS/BLUE SHIELD | Admitting: Family Medicine

## 2016-06-05 ENCOUNTER — Other Ambulatory Visit: Payer: Self-pay | Admitting: Family Medicine

## 2016-06-05 VITALS — BP 120/80 | HR 73 | Ht 71.0 in | Wt 189.4 lb

## 2016-06-05 DIAGNOSIS — Z113 Encounter for screening for infections with a predominantly sexual mode of transmission: Secondary | ICD-10-CM | POA: Diagnosis not present

## 2016-06-05 DIAGNOSIS — E559 Vitamin D deficiency, unspecified: Secondary | ICD-10-CM | POA: Diagnosis not present

## 2016-06-05 DIAGNOSIS — Z23 Encounter for immunization: Secondary | ICD-10-CM

## 2016-06-05 DIAGNOSIS — Z79899 Other long term (current) drug therapy: Secondary | ICD-10-CM | POA: Diagnosis not present

## 2016-06-05 DIAGNOSIS — M545 Low back pain, unspecified: Secondary | ICD-10-CM

## 2016-06-05 DIAGNOSIS — R7989 Other specified abnormal findings of blood chemistry: Secondary | ICD-10-CM | POA: Diagnosis not present

## 2016-06-05 DIAGNOSIS — Z0000 Encounter for general adult medical examination without abnormal findings: Secondary | ICD-10-CM

## 2016-06-05 DIAGNOSIS — R945 Abnormal results of liver function studies: Secondary | ICD-10-CM | POA: Diagnosis not present

## 2016-06-05 LAB — LIPID PANEL
Cholesterol: 176 mg/dL (ref 125–200)
HDL: 65 mg/dL (ref >=46)
LDL CALC: 98 mg/dL (ref <130)
Total CHOL/HDL Ratio: 2.7 Ratio (ref <=5.0)
Triglycerides: 66 mg/dL (ref <150)
VLDL: 13 mg/dL (ref <30)

## 2016-06-05 LAB — POCT URINALYSIS DIPSTICK
BILIRUBIN UA: NEGATIVE
GLUCOSE UA: NEGATIVE
Ketones, UA: NEGATIVE
Leukocytes, UA: NEGATIVE
Nitrite, UA: NEGATIVE
Protein, UA: NEGATIVE
RBC UA: NEGATIVE
SPEC GRAV UA: 1.025
UROBILINOGEN UA: NEGATIVE
pH, UA: 6

## 2016-06-05 LAB — COMPREHENSIVE METABOLIC PANEL
ALK PHOS: 49 U/L (ref 33–115)
ALT: 31 U/L — AB (ref 6–29)
AST: 38 U/L — AB (ref 10–30)
Albumin: 4.1 g/dL (ref 3.6–5.1)
BUN: 8 mg/dL (ref 7–25)
CO2: 21 mmol/L (ref 20–31)
Calcium: 9.5 mg/dL (ref 8.6–10.2)
Chloride: 104 mmol/L (ref 98–110)
Creat: 0.91 mg/dL (ref 0.50–1.10)
GLUCOSE: 88 mg/dL (ref 65–99)
POTASSIUM: 3.9 mmol/L (ref 3.5–5.3)
SODIUM: 140 mmol/L (ref 135–146)
Total Bilirubin: 0.9 mg/dL (ref 0.2–1.2)
Total Protein: 7.4 g/dL (ref 6.1–8.1)

## 2016-06-05 LAB — CBC WITH DIFFERENTIAL/PLATELET
BASOS PCT: 0 %
Basophils Absolute: 0 cells/uL (ref 0–200)
EOS PCT: 1 %
Eosinophils Absolute: 76 cells/uL (ref 15–500)
HCT: 37 % (ref 35.0–45.0)
Hemoglobin: 12.5 g/dL (ref 11.7–15.5)
LYMPHS PCT: 29 %
Lymphs Abs: 2204 cells/uL (ref 850–3900)
MCH: 27.8 pg (ref 27.0–33.0)
MCHC: 33.8 g/dL (ref 32.0–36.0)
MCV: 82.2 fL (ref 80.0–100.0)
MONOS PCT: 8 %
MPV: 10 fL (ref 7.5–12.5)
Monocytes Absolute: 608 cells/uL (ref 200–950)
Neutro Abs: 4712 cells/uL (ref 1500–7800)
Neutrophils Relative %: 62 %
PLATELETS: 321 10*3/uL (ref 140–400)
RBC: 4.5 MIL/uL (ref 3.80–5.10)
RDW: 14.2 % (ref 11.0–15.0)
WBC: 7.6 10*3/uL (ref 4.0–10.5)

## 2016-06-05 LAB — TSH: TSH: 1.53 mIU/L

## 2016-06-05 NOTE — Patient Instructions (Addendum)
For your back pain, try taking 2 Aleve twice daily with food for the next week and using heat to the area 2-3 times daily for 15-20 minutes at a time and see if you notice any improvement. As we discussed, use good body mechanics and try to avoid overexertion for now. If you are not noticing any improvement in a couple of weeks then follow-up. You received your Tdap today.  We will call you with lab results.    Preventative Care for Adults - Female      MAINTAIN REGULAR HEALTH EXAMS:  A routine yearly physical is a good way to check in with your primary care provider about your health and preventive screening. It is also an opportunity to share updates about your health and any concerns you have, and receive a thorough all-over exam.   Most health insurance companies pay for at least some preventative services.  Check with your health plan for specific coverages.  WHAT PREVENTATIVE SERVICES DO WOMEN NEED?  Adult women should have their weight and blood pressure checked regularly.   Women age 64 and older should have their cholesterol levels checked regularly.  Women should be screened for cervical cancer with a Pap smear and pelvic exam beginning at either age 49, or 3 years after they become sexually activity.    Breast cancer screening generally begins at age 7 with a mammogram and breast exam by your primary care provider.    Beginning at age 17 and continuing to age 27, women should be screened for colorectal cancer.  Certain people may need continued testing until age 50.  Updating vaccinations is part of preventative care.  Vaccinations help protect against diseases such as the flu.  Osteoporosis is a disease in which the bones lose minerals and strength as we age. Women ages 22 and over should discuss this with their caregivers, as should women after menopause who have other risk factors.  Lab tests are generally done as part of preventative care to screen for anemia and blood  disorders, to screen for problems with the kidneys and liver, to screen for bladder problems, to check blood sugar, and to check your cholesterol level.  Preventative services generally include counseling about diet, exercise, avoiding tobacco, drugs, excessive alcohol consumption, and sexually transmitted infections.    GENERAL RECOMMENDATIONS FOR GOOD HEALTH:  Healthy diet:  Eat a variety of foods, including fruit, vegetables, animal or vegetable protein, such as meat, fish, chicken, and eggs, or beans, lentils, tofu, and grains, such as rice.  Drink plenty of water daily.  Decrease saturated fat in the diet, avoid lots of red meat, processed foods, sweets, fast foods, and fried foods.  Exercise:  Aerobic exercise helps maintain good heart health. At least 30-40 minutes of moderate-intensity exercise is recommended. For example, a brisk walk that increases your heart rate and breathing. This should be done on most days of the week.   Find a type of exercise or a variety of exercises that you enjoy so that it becomes a part of your daily life.  Examples are running, walking, swimming, water aerobics, and biking.  For motivation and support, explore group exercise such as aerobic class, spin class, Zumba, Yoga,or  martial arts, etc.    Set exercise goals for yourself, such as a certain weight goal, walk or run in a race such as a 5k walk/run.  Speak to your primary care provider about exercise goals.  Disease prevention:  If you smoke or chew tobacco,  find out from your caregiver how to quit. It can literally save your life, no matter how long you have been a tobacco user. If you do not use tobacco, never begin.   Maintain a healthy diet and normal weight. Increased weight leads to problems with blood pressure and diabetes.   The Body Mass Index or BMI is a way of measuring how much of your body is fat. Having a BMI above 27 increases the risk of heart disease, diabetes, hypertension,  stroke and other problems related to obesity. Your caregiver can help determine your BMI and based on it develop an exercise and dietary program to help you achieve or maintain this important measurement at a healthful level.  High blood pressure causes heart and blood vessel problems.  Persistent high blood pressure should be treated with medicine if weight loss and exercise do not work.   Fat and cholesterol leaves deposits in your arteries that can block them. This causes heart disease and vessel disease elsewhere in your body.  If your cholesterol is found to be high, or if you have heart disease or certain other medical conditions, then you may need to have your cholesterol monitored frequently and be treated with medication.   Ask if you should have a cardiac stress test if your history suggests this. A stress test is a test done on a treadmill that looks for heart disease. This test can find disease prior to there being a problem.  Menopause can be associated with physical symptoms and risks. Hormone replacement therapy is available to decrease these. You should talk to your caregiver about whether starting or continuing to take hormones is right for you.   Osteoporosis is a disease in which the bones lose minerals and strength as we age. This can result in serious bone fractures. Risk of osteoporosis can be identified using a bone density scan. Women ages 59 and over should discuss this with their caregivers, as should women after menopause who have other risk factors. Ask your caregiver whether you should be taking a calcium supplement and Vitamin D, to reduce the rate of osteoporosis.   Avoid drinking alcohol in excess (more than two drinks per day).  Avoid use of street drugs. Do not share needles with anyone. Ask for professional help if you need assistance or instructions on stopping the use of alcohol, cigarettes, and/or drugs.  Brush your teeth twice a day with fluoride toothpaste, and  floss once a day. Good oral hygiene prevents tooth decay and gum disease. The problems can be painful, unattractive, and can cause other health problems. Visit your dentist for a routine oral and dental check up and preventive care every 6-12 months.   Look at your skin regularly.  Use a mirror to look at your back. Notify your caregivers of changes in moles, especially if there are changes in shapes, colors, a size larger than a pencil eraser, an irregular border, or development of new moles.  Safety:  Use seatbelts 100% of the time, whether driving or as a passenger.  Use safety devices such as hearing protection if you work in environments with loud noise or significant background noise.  Use safety glasses when doing any work that could send debris in to the eyes.  Use a helmet if you ride a bike or motorcycle.  Use appropriate safety gear for contact sports.  Talk to your caregiver about gun safety.  Use sunscreen with a SPF (or skin protection factor) of 15 or greater.  Lighter skinned people are at a greater risk of skin cancer. Don't forget to also wear sunglasses in order to protect your eyes from too much damaging sunlight. Damaging sunlight can accelerate cataract formation.   Practice safe sex. Use condoms. Condoms are used for birth control and to help reduce the spread of sexually transmitted infections (or STIs).  Some of the STIs are gonorrhea (the clap), chlamydia, syphilis, trichomonas, herpes, HPV (human papilloma virus) and HIV (human immunodeficiency virus) which causes AIDS. The herpes, HIV and HPV are viral illnesses that have no cure. These can result in disability, cancer and death.   Keep carbon monoxide and smoke detectors in your home functioning at all times. Change the batteries every 6 months or use a model that plugs into the wall.   Vaccinations:  Stay up to date with your tetanus shots and other required immunizations. You should have a booster for tetanus every 10  years. Be sure to get your flu shot every year, since 5%-20% of the [place] population comes down with the flu. The flu vaccine changes each year, so being vaccinated once is not enough. Get your shot in the fall, before the flu season peaks.   Other vaccines to consider:  Human Papilloma Virus or HPV causes cancer of the cervix, and other infections that can be transmitted from person to person. There is a vaccine for HPV, and females should get immunized between the ages of 8 and 35. It requires a series of 3 shots.   Pneumococcal vaccine to protect against certain types of pneumonia.  This is normally recommended for adults age 23 or older.  However, adults younger than 42 years old with certain underlying conditions such as diabetes, heart or lung disease should also receive the vaccine.  Shingles vaccine to protect against Varicella Zoster if you are older than age 76, or younger than 42 years old with certain underlying illness.  Hepatitis A vaccine to protect against a form of infection of the liver by a virus acquired from food.  Hepatitis B vaccine to protect against a form of infection of the liver by a virus acquired from blood or body fluids, particularly if you work in health care.  If you plan to travel internationally, check with your local health department for specific vaccination recommendations.  Cancer Screening:  Breast cancer screening is essential to preventive care for women. All women age 56 and older should perform a breast self-exam every month. At age 37 and older, women should have their caregiver complete a breast exam each year. Women at ages 50 and older should have a mammogram (x-ray film) of the breasts. Your caregiver can discuss how often you need mammograms.    Cervical cancer screening includes taking a Pap smear (sample of cells examined under a microscope) from the cervix (end of the uterus). It also includes testing for HPV (Human Papilloma Virus, which can  cause cervical cancer). Screening and a pelvic exam should begin at age 66, or 3 years after a woman becomes sexually active. Screening should occur every year, with a Pap smear but no HPV testing, up to age 28. After age 90, you should have a Pap smear every 3 years with HPV testing, if no HPV was found previously.   Most routine colon cancer screening begins at the age of 44. On a yearly basis, doctors may provide special easy to use take-home tests to check for hidden blood in the stool. Sigmoidoscopy or colonoscopy can detect the  earliest forms of colon cancer and is life saving. These tests use a small camera at the end of a tube to directly examine the colon. Speak to your caregiver about this at age 61, when routine screening begins (and is repeated every 5 years unless early forms of pre-cancerous polyps or small growths are found).

## 2016-06-05 NOTE — Progress Notes (Signed)
Subjective:    Patient ID: [name], female    DOB: October 08, 1975, 40 y.o.   MRN: [id number]  HPI Chief Complaint  Patient presents with  . cpe    fasting cpe, no pap- ankle still hurts, pulled muscle on left side. eye exam done yearly. declines flu shot   She is here for a CPE.  Complains of left low back pain for the past 2 weeks after moving her desk at home. Pain feels like a muscle ache and is nonradiating. Certain movements makes pain worse and pain is improved at rest. Denies numbness, tingling, weakness. No loss of control of bowels or bladder. No urinary symptoms. She has not tried anything at home for her symptoms.  Diagnosed in 2015-2016 with HTN States she checks her blood pressure at home daily and her readings are usually around 117/79, <120/80 regularly. States she has some unilateral ankle swelling after being on her feet for several hours to the ankle that has had reconstruction and hardware. States the swelling goes away after being off her feet.  History of vitamin D deficiency. Is not currently taking vitamin D. Cannot recall when vitamin D level was last checked.   Previous medical care: [facility] outpatient  Last CPE: last October  Other providers: Eye doctor- goes annually   GI in [place]- saw them for abdominal pain.  OB/GYN Dr. Paulino Rily in [place], in July 2017  Surgeries: hysterectomy at age 76 due to fibroids. Ovaries removed.    Family history: mother passed away at age 42, unknown.   Social history: Lives with spouse and children , works , age 19 and 60.  Denies smoking, drinking alcohol, drug use  Diet: only eats 1-2 meals per day. Does not know how many calories.  Excerise: Reports daily exercise for 1 hour per day on elliptical or treadmill.  Immunizations: flu shot- does not want this. Tdap- never. Td in 2011  Health maintenance:  Mammogram: June 09 2016-next week  Colonoscopy: 2 years ago- was having abdominal pain and constipation.  Recommended return in 2023 Last Gynecological Exam: July 2017 Last Menstrual cycle: 2008 Pregnancies: 2 Last Dental Exam: annually, scheduled next month.  Last Eye Exam: Dr. Wynetta Emery- annually. Reading glasses Std testing.   Wears seatbelt always, uses sunscreen, smoke detectors in home and functioning, does not text while driving and feels safe in home environment.   Reviewed allergies, medications, past medical, surgical, family, and social history.   Review of Systems Review of Systems Constitutional: -fever, -chills, -sweats, -unexpected weight change,-fatigue ENT: -runny nose, -ear pain, -sore throat Cardiology:  -chest pain, -palpitations, -edema Respiratory: -cough, -shortness of breath, -wheezing Gastroenterology: -abdominal pain, -nausea, -vomiting, -diarrhea, -constipation  Hematology: -bleeding or bruising problems Musculoskeletal: -arthralgias, -myalgias, -joint swelling, -back pain Ophthalmology: -vision changes Urology: -dysuria, -difficulty urinating, -hematuria, -urinary frequency, -urgency Neurology: -headache, -weakness, -tingling, -numbness       Objective:   Physical Exam BP 120/80   Pulse 73   Ht 5\' 11"  (1.803 m)   Wt 189 lb 6.4 oz (85.9 kg)   BMI 26.42 kg/m   General Appearance:    Alert, cooperative, no distress, appears stated age  Head:    Normocephalic, without obvious abnormality, atraumatic  Eyes:    PERRL, conjunctiva/corneas clear, EOM's intact, fundi    benign  Ears:    Normal TM's and external ear canals  Nose:   Nares normal, mucosa normal, no drainage or sinus   tenderness  Throat:   Lips, mucosa, and  tongue normal; teeth and gums normal  Neck:   Supple, no lymphadenopathy;  thyroid:  no   enlargement/tenderness/nodules; no carotid   bruit or JVD  Back:    Spine nontender, no curvature, ROM normal, no CVA     tenderness  Lungs:     Clear to auscultation bilaterally without wheezes, rales or     ronchi; respirations unlabored  Chest  Wall:    No tenderness or deformity   Heart:    Regular rate and rhythm, S1 and S2 normal, no murmur, rub   or gallop  Breast Exam:    This was done at her OB/GYN office in the past 6 months.   Abdomen:     Soft, non-tender, nondistended, normoactive bowel sounds,    no masses, no hepatosplenomegaly  Genitalia:    Done at her OB/GYN office.   Rectal:    Has had a colonoscopy in past 2 years.   Extremities:   No clubbing, cyanosis or edema  Pulses:   2+ and symmetric all extremities  Skin:   Skin color, texture, turgor normal, no rashes or lesions  Lymph nodes:   Cervical, supraclavicular, and axillary nodes normal  Neurologic:   CNII-XII intact, normal strength, sensation and gait; reflexes 2+ and symmetric throughout          Psych:   Normal mood, affect, hygiene and grooming.    Urinalysis dipstick:  negative      Assessment & Plan:  Routine general medical examination at a health care facility - Plan: CBC with Differential/Platelet, Comprehensive metabolic panel, TSH, Lipid panel  Acute left-sided low back pain without sciatica  Screening for STD (sexually transmitted disease) - Plan: HIV antibody, GC/Chlamydia Probe Amp, RPR  Need for Tdap vaccination - Plan: Tdap vaccine greater than or equal to 7yo IM  Vitamin D deficiency - Plan: VITAMIN D 25 Hydroxy (Vit-D Deficiency, Fractures)  She appears to have a muscle strain to her left low back and has not taken any medication or tried any home treatments. Recommend using heat to the area and anti-inflammatories. If conservative therapy is not improving her symptoms in 2 weeks she will follow-up. Discussed that she appears to be healthy. Her blood pressure is well managed on her current medication. She has concerns regarding intermittent left ankle and foot swelling after activity and she does have hardware in the history of surgery to this area. Her exam is normal today. LLE is neurovascularly intact. Advised that if she continues to  have discomfort or swelling and is wanting to pursue possible removal of hardware then I recommend she follow-up with her surgeon. Screening for STDs performed per patient request. Tdap given.  Will follow-up pending labs or in 1 year.

## 2016-06-05 NOTE — Addendum Note (Signed)
Addended by: Minette Headland A on: 06/05/2016 11:19 AM   Modules accepted: Orders

## 2016-06-06 LAB — HIV ANTIBODY (ROUTINE TESTING W REFLEX): HIV: NONREACTIVE

## 2016-06-06 LAB — VITAMIN D 25 HYDROXY (VIT D DEFICIENCY, FRACTURES): Vit D, 25-Hydroxy: 24 ng/mL — ABNORMAL LOW (ref 30–100)

## 2016-06-06 LAB — GC/CHLAMYDIA PROBE AMP
CT Probe RNA: NOT DETECTED
GC Probe RNA: NOT DETECTED

## 2016-06-06 LAB — RPR

## 2016-06-07 LAB — HEPATITIS PANEL, ACUTE
HCV AB: NEGATIVE
HEP B C IGM: NONREACTIVE
Hep A IgM: NONREACTIVE
Hepatitis B Surface Ag: NEGATIVE

## 2016-06-09 DIAGNOSIS — Z1231 Encounter for screening mammogram for malignant neoplasm of breast: Secondary | ICD-10-CM | POA: Diagnosis not present

## 2016-06-09 DIAGNOSIS — Z803 Family history of malignant neoplasm of breast: Secondary | ICD-10-CM | POA: Diagnosis not present

## 2016-06-09 LAB — HM MAMMOGRAPHY

## 2016-06-10 ENCOUNTER — Other Ambulatory Visit: Payer: Self-pay

## 2016-06-10 DIAGNOSIS — R748 Abnormal levels of other serum enzymes: Secondary | ICD-10-CM

## 2016-06-10 DIAGNOSIS — E559 Vitamin D deficiency, unspecified: Secondary | ICD-10-CM

## 2016-06-10 MED ORDER — VITAMIN D (ERGOCALCIFEROL) 1.25 MG (50000 UNIT) PO CAPS: 50000.0000 [IU] | ORAL_CAPSULE | ORAL | 0 refills | Status: DC

## 2016-06-10 NOTE — Progress Notes (Signed)
   Subjective:    Patient ID: [name], female    DOB: 11-28-1975, 40 y.o.   MRN: [id number]  HPI    Review of Systems     Objective:   Physical Exam        Assessment & Plan:

## 2016-06-18 DIAGNOSIS — F331 Major depressive disorder, recurrent, moderate: Secondary | ICD-10-CM | POA: Diagnosis not present

## 2016-06-23 DIAGNOSIS — F331 Major depressive disorder, recurrent, moderate: Secondary | ICD-10-CM | POA: Diagnosis not present

## 2016-07-08 DIAGNOSIS — B070 Plantar wart: Secondary | ICD-10-CM | POA: Diagnosis not present

## 2016-07-08 DIAGNOSIS — M79671 Pain in right foot: Secondary | ICD-10-CM | POA: Diagnosis not present

## 2016-07-08 DIAGNOSIS — M2042 Other hammer toe(s) (acquired), left foot: Secondary | ICD-10-CM | POA: Diagnosis not present

## 2016-07-09 DIAGNOSIS — F331 Major depressive disorder, recurrent, moderate: Secondary | ICD-10-CM | POA: Diagnosis not present

## 2016-07-16 DIAGNOSIS — F331 Major depressive disorder, recurrent, moderate: Secondary | ICD-10-CM | POA: Diagnosis not present

## 2016-07-22 DIAGNOSIS — B070 Plantar wart: Secondary | ICD-10-CM | POA: Diagnosis not present

## 2016-08-08 DIAGNOSIS — F331 Major depressive disorder, recurrent, moderate: Secondary | ICD-10-CM | POA: Diagnosis not present

## 2016-08-12 ENCOUNTER — Other Ambulatory Visit: Payer: BLUE CROSS/BLUE SHIELD

## 2016-08-15 DIAGNOSIS — F331 Major depressive disorder, recurrent, moderate: Secondary | ICD-10-CM | POA: Diagnosis not present

## 2016-09-03 DIAGNOSIS — F331 Major depressive disorder, recurrent, moderate: Secondary | ICD-10-CM | POA: Diagnosis not present

## 2016-09-10 DIAGNOSIS — F331 Major depressive disorder, recurrent, moderate: Secondary | ICD-10-CM | POA: Diagnosis not present

## 2016-09-15 ENCOUNTER — Other Ambulatory Visit: Payer: Self-pay | Admitting: Family Medicine

## 2016-09-21 ENCOUNTER — Other Ambulatory Visit: Payer: Self-pay | Admitting: Family Medicine

## 2016-09-22 DIAGNOSIS — F331 Major depressive disorder, recurrent, moderate: Secondary | ICD-10-CM | POA: Diagnosis not present

## 2016-10-08 DIAGNOSIS — F331 Major depressive disorder, recurrent, moderate: Secondary | ICD-10-CM | POA: Diagnosis not present

## 2016-10-14 DIAGNOSIS — F331 Major depressive disorder, recurrent, moderate: Secondary | ICD-10-CM | POA: Diagnosis not present

## 2016-10-15 DIAGNOSIS — L811 Chloasma: Secondary | ICD-10-CM | POA: Diagnosis not present

## 2016-10-15 DIAGNOSIS — L818 Other specified disorders of pigmentation: Secondary | ICD-10-CM | POA: Diagnosis not present

## 2016-10-15 DIAGNOSIS — L820 Inflamed seborrheic keratosis: Secondary | ICD-10-CM | POA: Insufficient documentation

## 2016-10-15 DIAGNOSIS — Z85828 Personal history of other malignant neoplasm of skin: Secondary | ICD-10-CM | POA: Diagnosis not present

## 2016-10-23 ENCOUNTER — Encounter: Payer: Self-pay | Admitting: Family Medicine

## 2016-10-23 ENCOUNTER — Ambulatory Visit (INDEPENDENT_AMBULATORY_CARE_PROVIDER_SITE_OTHER): Payer: Medicare Other | Admitting: Family Medicine

## 2016-10-23 VITALS — BP 120/80 | HR 58 | Temp 98.0°F | Wt 191.0 lb

## 2016-10-23 DIAGNOSIS — K219 Gastro-esophageal reflux disease without esophagitis: Secondary | ICD-10-CM | POA: Diagnosis not present

## 2016-10-23 DIAGNOSIS — R110 Nausea: Secondary | ICD-10-CM | POA: Diagnosis not present

## 2016-10-23 DIAGNOSIS — R350 Frequency of micturition: Secondary | ICD-10-CM | POA: Diagnosis not present

## 2016-10-23 DIAGNOSIS — R1013 Epigastric pain: Secondary | ICD-10-CM | POA: Diagnosis not present

## 2016-10-23 LAB — CBC WITH DIFFERENTIAL/PLATELET
BASOS PCT: 0 %
Basophils Absolute: 0 cells/uL (ref 0–200)
EOS PCT: 1 %
Eosinophils Absolute: 91 cells/uL (ref 15–500)
HCT: 37.9 % (ref 35.0–45.0)
Hemoglobin: 12.4 g/dL (ref 11.7–15.5)
LYMPHS PCT: 22 %
Lymphs Abs: 2002 cells/uL (ref 850–3900)
MCH: 27 pg (ref 27.0–33.0)
MCHC: 32.7 g/dL (ref 32.0–36.0)
MCV: 82.6 fL (ref 80.0–100.0)
MONOS PCT: 8 %
MPV: 9.9 fL (ref 7.5–12.5)
Monocytes Absolute: 728 cells/uL (ref 200–950)
Neutro Abs: 6279 cells/uL (ref 1500–7800)
Neutrophils Relative %: 69 %
PLATELETS: 331 10*3/uL (ref 140–400)
RBC: 4.59 MIL/uL (ref 3.80–5.10)
RDW: 14.9 % (ref 11.0–15.0)
WBC: 9.1 10*3/uL (ref 4.0–10.5)

## 2016-10-23 LAB — COMPREHENSIVE METABOLIC PANEL
ALK PHOS: 57 U/L (ref 33–115)
ALT: 37 U/L — AB (ref 6–29)
AST: 33 U/L — AB (ref 10–30)
Albumin: 4.1 g/dL (ref 3.6–5.1)
BUN: 6 mg/dL — ABNORMAL LOW (ref 7–25)
CO2: 27 mmol/L (ref 20–31)
Calcium: 9.5 mg/dL (ref 8.6–10.2)
Chloride: 104 mmol/L (ref 98–110)
Creat: 0.74 mg/dL (ref 0.50–1.10)
GLUCOSE: 85 mg/dL (ref 65–99)
Potassium: 3.9 mmol/L (ref 3.5–5.3)
Sodium: 139 mmol/L (ref 135–146)
Total Bilirubin: 0.8 mg/dL (ref 0.2–1.2)
Total Protein: 7.4 g/dL (ref 6.1–8.1)

## 2016-10-23 LAB — POCT URINALYSIS DIPSTICK
Bilirubin, UA: NEGATIVE
Blood, UA: NEGATIVE
GLUCOSE UA: NEGATIVE
Ketones, UA: NEGATIVE
Leukocytes, UA: NEGATIVE
Nitrite, UA: NEGATIVE
Protein, UA: NEGATIVE
Spec Grav, UA: 1.015
UROBILINOGEN UA: NEGATIVE
pH, UA: 8

## 2016-10-23 MED ORDER — ONDANSETRON 4 MG PO TBDP: 4.0000 mg | ORAL_TABLET | Freq: Three times a day (TID) | ORAL | 0 refills | Status: DC | PRN

## 2016-10-23 MED ORDER — ESOMEPRAZOLE MAGNESIUM 40 MG PO CPDR: 40.0000 mg | DELAYED_RELEASE_CAPSULE | Freq: Every day | ORAL | 3 refills | Status: DC

## 2016-10-23 NOTE — Patient Instructions (Signed)
Start taking the Nexium 30 minutes before breakfast. Avoid foods that make the pain worse.  If you get worse or develop any worrisome symptoms as discussed (fever, vomiting, blood in your stools or severe pain) then you will need to go to the emergency department.  Follow up in 2 weeks with me or sooner if needed.   Heartburn Heartburn is a type of pain or discomfort that can happen in the throat or chest. It is often described as a burning pain. It may also cause a bad taste in the mouth. Heartburn may feel worse when you lie down or bend over. It may be caused by stomach contents that move back up (reflux) into the tube that connects the mouth with the stomach (esophagus). Follow these instructions at home: Take these actions to lessen your discomfort and to help avoid problems. Diet   Follow a diet as told by your doctor. You may need to avoid foods and drinks such as:  Coffee and tea (with or without caffeine).  Drinks that contain alcohol.  Energy drinks and sports drinks.  Carbonated drinks or sodas.  Chocolate and cocoa.  Peppermint and mint flavorings.  Garlic and onions.  Horseradish.  Spicy and acidic foods, such as peppers, chili powder, curry powder, vinegar, hot sauces, and BBQ sauce.  Citrus fruit juices and citrus fruits, such as oranges, lemons, and limes.  Tomato-based foods, such as red sauce, chili, salsa, and pizza with red sauce.  Fried and fatty foods, such as donuts, french fries, potato chips, and high-fat dressings.  High-fat meats, such as hot dogs, rib eye steak, sausage, ham, and bacon.  High-fat dairy items, such as whole milk, butter, and cream cheese.  Eat small meals often. Avoid eating large meals.  Avoid drinking large amounts of liquid with your meals.  Avoid eating meals during the 2-3 hours before bedtime.  Avoid lying down right after you eat.  Do not exercise right after you eat. General instructions   Pay attention to any  changes in your symptoms.  Take over-the-counter and prescription medicines only as told by your doctor. Do not take aspirin, ibuprofen, or other NSAIDs unless your doctor says it is okay.  Do not use any tobacco products, including cigarettes, chewing tobacco, and e-cigarettes. If you need help quitting, ask your doctor.  Wear loose clothes. Do not wear anything tight around your waist.  Raise (elevate) the head of your bed about 6 inches (15 cm).  Try to lower your stress. If you need help doing this, ask your doctor.  If you are overweight, lose an amount of weight that is healthy for you. Ask your doctor about a safe weight loss goal.  Keep all follow-up visits as told by your doctor. This is important. Contact a doctor if:  You have new symptoms.  You lose weight and you do not know why it is happening.  You have trouble swallowing, or it hurts to swallow.  You have wheezing or a cough that keeps happening.  Your symptoms do not get better with treatment.  You have heartburn often for more than two weeks. Get help right away if:  You have pain in your arms, neck, jaw, teeth, or back.  You feel sweaty, dizzy, or light-headed.  You have chest pain or shortness of breath.  You throw up (vomit) and your throw up looks like blood or coffee grounds.  Your poop (stool) is bloody or black. This information is not intended to replace advice  given to you by your health care provider. Make sure you discuss any questions you have with your health care provider. Document Released: 04/09/2011 Document Revised: 01/03/2016 Document Reviewed: 11/22/2014 Elsevier Interactive Patient Education  2017 [employer].

## 2016-10-23 NOTE — Progress Notes (Signed)
Subjective:    Patient ID: [name], female    DOB: 1975-09-21, 41 y.o.   MRN: [id number]  HPI Chief Complaint  Patient presents with  . stomach pain    stomach pain for month, nauseated and tired   She is here with complaints of 2 -3 month history of intermittent epigastric pain, nausea, and decreased appetite. Pain feels like a burning or gnawing and is non radiating. States pain is worse in the morning and after eating. Occasionally pain wakes her up at night. Denies pain at the present.  She has not been taking any medication for reflux.  Reports similar pain in the distant past and has seen GI. Had an EGD in 2013 that showed mild chronic gastritis. Neg H Pylori.  Denies fever, chills, dizziness, chest pain, palpitations, shortness of breath, cough, vomiting, diarrhea.  Denies early satiety.   States she is having regular bowel movements, 3 times per week. No changes in bowel habits. No blood in stool   Denies alcohol use or NSAID use.   She also reports urinary frequency for the past week without dysuria, urgency or incontinence. No vaginal discharge.    History of fatty liver on CT and mildly elevated LFTs.   CT from 01/2016 findings below:  IMPRESSION: 1. No explanation for the patient's diffuse abdominal pain and blood in stool is seen. Unfortunately the lower pelvis is obscured by multiple linear artifacts created by bilateral total hip replacements. 2. Possible fatty infiltration of the liver.  Correlate with LFTs. 3. Prominent appendix, but there is air within the appendix and the periappendiceal fat planes are unremarkable. Early appendicitis cannot be excluded by CT, and clinical correlation is recommended. 4. Scattered rectosigmoid colon diverticula. 5. Degenerative disc disease at L2-3.  Electronically Signed   By: Ivar Drape M.D.   On: 01/30/2016 09:16  Hysterectomy.   Past Medical History:  Diagnosis Date  . Abdominal pain, epigastric  12/17/2015  . Anxiety    pt denies having dx of anxiety  . BCC (basal cell carcinoma of skin)    right foot   . Bunion, right foot   . Chronic cough   . Closed fibular fracture 10/2008   Left - Sustained 2/2 fall down stairs (same fall as tibular fracture). S/P internal fixation.  . Duodenitis    EGD (07/14/2011) showing chronic duodenitis, H.pyloir neg  . Endometriosis    S/P TAH and salpingo-oophorectomy, right  . Epigastric pain    Chronic, thought 2/2 gastritis, EGD (07/14/2011) showing chronic duodenitis, H.pyloir neg // Repeat EGD (09/2011) - esophagealring, sessible polyp in stomach body, hiatal hernia - rec ad probiotic, await bx  . Esophageal ring    s/p dilatation during EGD (07/2011) - Dr. Oneida Alar  . Gastritis    Thought due to chronic NSAID use 2/2 pain from her congenital hip abnormality  . Headache(784.0)    Migraines  . Hidradenitis suppurativa 05/2006  . 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 With chronic OA of hip  . History of migraine headaches    Typical symptoms - bright lights, unilateral (starting behind her left ear), throbbing .  Marland Kitchen Hypertension   . Internal hemorrhoids   . Osteoarthritis of hip   . Ovarian cancer ([place]) 1995  . Protrusio acetabuli    diagnosed at age 41  . Sessile colonic polyp    noted 09/2011 -- > rec colonoscopy in 10 years, 2023  . Surgical  menopause 08/2007   On HRT. Occuring since 01/09 following TAH and R-salpingo-oophorectomy   . Tibial fracture 10/2008   Left - Sustained 2/2 fall down stairs. Patient is S/P closed internal fixation with Smith Nephew tibial nail locked proximal and distal  . Vitamin D deficiency 11/2009   Vit D level 15 in 11/2009   Past Surgical History:  Procedure Laterality Date  . ABDOMINAL HYSTERECTOMY    . CESAREAN SECTION  08/2005   Primary low transverse  . COLONOSCOPY  09/22/2011   PNT:[id number] polyp in the rectum/Internal hemorrhoids,  benign path  . ESOPHAGOGASTRODUODENOSCOPY  09/22/2011   QMG:QQPY, esophageal/Sessile polyp in the body of the stomach/Hiatal hernia/ABDOMINAL PAIN LIKELY FUNCTIONAL V. POST-INFECTIOUS IBS, path: mild chronic gastritis  . FRACTURE SURGERY     Right hip  . JOINT REPLACEMENT     right hip  . LAPAROSCOPY  10/2000   Operative laparoscopy with lysis of right adnexal adhesions and uterointestinal adhesions  . left tib fib    . OTHER SURGICAL HISTORY  02/2009   Closed treatment internal fixation, left tibia with Tamala Julian and Nephew tibial nail locked proximal and distal  . TOTAL ABDOMINAL HYSTERECTOMY W/ BILATERAL SALPINGOOPHORECTOMY  08/2007   for endometriosis. With concurrent right salpingo-oophorectomy  (2009), left oophorectomy (1995)  . TUBAL LIGATION  08/2005   Right tubal ligation   Reviewed allergies, medications, past medical, surgical,  and social history.    Review of Systems Pertinent positives and negatives in the history of present illness.     Objective:   Physical Exam  Constitutional: She is oriented to person, place, and time. She appears well-developed and well-nourished. She does not have a sickly appearance. No distress.  HENT:  Mouth/Throat: Uvula is midline, oropharynx is clear and moist and mucous membranes are normal.  Eyes: Lids are normal. Pupils are equal, round, and reactive to light. No scleral icterus.  Neck: Full passive range of motion without pain. Neck supple. No JVD present.  Cardiovascular: Normal rate, regular rhythm, normal heart sounds and normal pulses.  Exam reveals no gallop and no friction rub.   No murmur heard. Pulmonary/Chest: Effort normal and breath sounds normal.  Abdominal: Soft. Normal appearance and bowel sounds are normal. There is no hepatosplenomegaly. There is generalized tenderness. There is no rigidity, no rebound, no guarding, no CVA tenderness, no tenderness at McBurney's point and negative Murphy's sign.  Lymphadenopathy:    She  has no cervical adenopathy.       Right: No supraclavicular adenopathy present.       Left: No supraclavicular adenopathy present.  Neurological: She is alert and oriented to person, place, and time. No cranial nerve deficit or sensory deficit. Gait normal.  Skin: Skin is warm and dry. No rash noted. No pallor.  Psychiatric: She has a normal mood and affect. Her speech is normal and behavior is normal. Thought content normal.   BP 120/80   Pulse (!) 58   Temp 98 F (36.7 C) (Oral)   Wt 191 lb (86.6 kg)   SpO2 99%   BMI 26.64 kg/m       Assessment & Plan:  Abdominal pain, epigastric - Plan: CBC with Differential/Platelet, Comprehensive metabolic panel, Amylase, Lipase, esomeprazole (NEXIUM) 40 MG capsule  Urinary frequency - Plan: POCT urinalysis dipstick  Gastroesophageal reflux disease, esophagitis presence not specified - Plan: esomeprazole (NEXIUM) 40 MG capsule  Nausea without vomiting - Plan: ondansetron (ZOFRAN ODT) 4 MG disintegrating tablet  Urinalysis dipstick: negative  Plan to check  labs for urinary frequency. No sign of infection.   Suspect her epigastric pain and symptoms are related to GERD.  Reviewed results of CT abdomen, EGD and colonoscopy with patient.  Advised her to start taking Nexium 40 mg once daily, samples given #6 and prescription sent. We will see if she notices any improvement. Follow up in 2 weeks.  If she is not improving or symptoms get worse she will call/return or go to the emergency room if after hours. Discussed red flags of abdominal pain.  zofran sent to pharmacy.  Follow up pending labs.

## 2016-10-24 LAB — LIPASE: Lipase: 39 U/L (ref 7–60)

## 2016-10-24 LAB — AMYLASE: Amylase: 53 U/L (ref 0–105)

## 2016-10-31 ENCOUNTER — Telehealth: Payer: Self-pay | Admitting: Gastroenterology

## 2016-10-31 DIAGNOSIS — F331 Major depressive disorder, recurrent, moderate: Secondary | ICD-10-CM | POA: Diagnosis not present

## 2016-10-31 NOTE — Telephone Encounter (Signed)
Pt was checking to see if she could be seen sooner that 4/12 because she was having rectal bleeding. I told her I had no openings at the moment but I would let [facility] nurse be aware. Please advise [phone]

## 2016-10-31 NOTE — Telephone Encounter (Signed)
I spoke to the pt. She saw bright red blood in her stool x 3. She is aware if she has hemorrhoids.  She was previously scheduled for abdominal pain. I have scheduled her an urgent appt with Roseanne Kaufman, NP on 11/04/2016 at 11:30 Am.  I cancelled the appt in April.

## 2016-11-04 ENCOUNTER — Encounter: Payer: Self-pay | Admitting: Gastroenterology

## 2016-11-04 ENCOUNTER — Ambulatory Visit (INDEPENDENT_AMBULATORY_CARE_PROVIDER_SITE_OTHER): Payer: Medicare Other | Admitting: Gastroenterology

## 2016-11-04 VITALS — BP 142/89 | HR 72 | Temp 98.4°F | Ht 71.5 in | Wt 198.2 lb

## 2016-11-04 DIAGNOSIS — R1010 Upper abdominal pain, unspecified: Secondary | ICD-10-CM | POA: Insufficient documentation

## 2016-11-04 DIAGNOSIS — R109 Unspecified abdominal pain: Secondary | ICD-10-CM | POA: Diagnosis not present

## 2016-11-04 NOTE — Patient Instructions (Signed)
I would like for you to restart the Nexium. Make sure you are taking it first thing in the morning on an empty stomach, 30 minutes before breakfast.   I have ordered an ultrasound of your gallbladder. You may need an additional test, but we will let you know.  Please start taking Benefiber or Metamucil daily. We may need to add a prescription agent to help with your bowel movements.   I would like to see you in 4 weeks. We will need to discuss an updated colonoscopy and further evaluation then.

## 2016-11-04 NOTE — Progress Notes (Signed)
Primary Care Physician:  Harland Dingwall, NP-C Primary Gastroenterologist:  Dr. Oneida Alar   Chief Complaint  Patient presents with  . Abdominal Pain  . Rectal Bleeding    x1, red/orange  . Bloated  . Fatigue    HPI:   [name] is a 41 y.o. female presenting today with history of mild normocytic anemia, normal iron/ferritin in past, abdominal pain, constipation. Last TCS/EGD in Feb 2013 with mild gastritis, dilation of esophageal ring, benign polyp and internal hemorrhoids.   Notes abdominal pain around umbilicus and epigastric region. Feels strange in RUQ. Feels full. Can't shake it off. Notes nausea. No vomiting. Has a "watery" sensation in mouth but hasn't vomited. No dysphagia. Thought maybe she had a virus. Noted as constant. Worse after eating. Hurts when laying down. Tries to sleep in different positions but wakes her up. Not sleeping. Saw one episode of bleeding about a week ago, none further. Has alternating constipation and diarrhea. Can't quite put her finger on it. States her kids have had lots of viruses and these symptoms are lingering about a month or longer. Believes constipation/diarrhea is 50/50. Gallbladder remains in situ. Not on a PPI. Had been prescribed Nexium but this did not help at all. Taking Zofran for nausea. Very rare NSAIDs.   BM about 3 times a week. Doesn't feel productive.  If exercising a lot, will be more regular. Stopped with Linzess because she felt it was causing her to gain weight.   Past Medical History:  Diagnosis Date  . Abdominal pain, epigastric 12/17/2015  . Anxiety    pt denies having dx of anxiety  . BCC (basal cell carcinoma of skin)    right foot   . Bunion, right foot   . Chronic cough   . Closed fibular fracture 10/2008   Left - Sustained 2/2 fall down stairs (same fall as tibular fracture). S/P internal fixation.  . Duodenitis    EGD (07/14/2011) showing chronic duodenitis, H.pyloir neg  . Endometriosis    S/P TAH and  salpingo-oophorectomy, right  . Epigastric pain    Chronic, thought 2/2 gastritis, EGD (07/14/2011) showing chronic duodenitis, H.pyloir neg // Repeat EGD (09/2011) - esophagealring, sessible polyp in stomach body, hiatal hernia - rec ad probiotic, await bx  . Esophageal ring    s/p dilatation during EGD (07/2011) - Dr. Oneida Alar  . Gastritis    Thought due to chronic NSAID use 2/2 pain from her congenital hip abnormality  . Headache(784.0)    Migraines  . Hidradenitis suppurativa 05/2006  . 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 With chronic OA of hip  . History of migraine headaches    Typical symptoms - bright lights, unilateral (starting behind her left ear), throbbing .  Marland Kitchen Hypertension   . Internal hemorrhoids   . Osteoarthritis of hip   . Ovarian cancer ([place]) 1995  . Protrusio acetabuli    diagnosed at age 41  . Sessile colonic polyp    noted 09/2011 -- > rec colonoscopy in 10 years, 2023  . Surgical menopause 08/2007   On HRT. Occuring since 01/09 following TAH and R-salpingo-oophorectomy   . Tibial fracture 10/2008   Left - Sustained 2/2 fall down stairs. Patient is S/P closed internal fixation with Smith Nephew tibial nail locked proximal and distal  . Vitamin D deficiency 11/2009   Vit D level 15 in 11/2009    Past Surgical History:  Procedure Laterality  Date  . ABDOMINAL HYSTERECTOMY    . CESAREAN SECTION  08/2005   Primary low transverse  . COLONOSCOPY  09/22/2011   WUJ:[id number] polyp in the rectum/Internal hemorrhoids, benign path  . ESOPHAGOGASTRODUODENOSCOPY  09/22/2011   WGN:FAOZ, esophageal/Sessile polyp in the body of the stomach/Hiatal hernia/ABDOMINAL PAIN LIKELY FUNCTIONAL V. POST-INFECTIOUS IBS, path: mild chronic gastritis  . FRACTURE SURGERY     Right hip  . JOINT REPLACEMENT     right hip  . LAPAROSCOPY  10/2000   Operative laparoscopy with lysis of right adnexal adhesions and  uterointestinal adhesions  . left tib fib    . OTHER SURGICAL HISTORY  02/2009   Closed treatment internal fixation, left tibia with Tamala Julian and Nephew tibial nail locked proximal and distal  . TOTAL ABDOMINAL HYSTERECTOMY W/ BILATERAL SALPINGOOPHORECTOMY  08/2007   for endometriosis. With concurrent right salpingo-oophorectomy  (2009), left oophorectomy (1995)  . TUBAL LIGATION  08/2005   Right tubal ligation    Current Outpatient Prescriptions  Medication Sig Dispense Refill  . amLODipine (NORVASC) 10 MG tablet Take 10 mg by mouth daily.    . ondansetron (ZOFRAN ODT) 4 MG disintegrating tablet Take 1 tablet (4 mg total) by mouth every 8 (eight) hours as needed for nausea or vomiting. 20 tablet 0  . esomeprazole (NEXIUM) 40 MG capsule Take 1 capsule (40 mg total) by mouth daily. (Patient not taking: Reported on 11/04/2016) 30 capsule 3  . estradiol (ESTRACE) 1 MG tablet Take 1 mg by mouth.     No current facility-administered medications for this visit.     Allergies as of 11/04/2016 - Review Complete 11/04/2016  Allergen Reaction Noted  . Pineapple Shortness Of Breath and Swelling 10/02/2010  . Morphine and related Itching 09/13/2010  . Tape Swelling 03/03/2012    Family History  Problem Relation Age of Onset  . Other Father     deceased while in service.   . Breast cancer Sister 2    remission  . Cancer Sister     breast  . Lupus Cousin   . Colon cancer Neg Hx   . Liver disease Neg Hx     Social History   Social History  . Marital status: Married    Spouse name: N/A  . Number of children: 2  . Years of education: bs degree   Occupational History  . 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 Smoking status: Never Smoker  . Smokeless tobacco: Never Used  . Alcohol use No  . Drug use: No  . Sexual activity: Not on file   Other Topics Concern  . Not on file   Social History Narrative   Insurance: Medicare.   Patient is married with two  children, [place] and [place].           Review of Systems: As mentioned in HPI   Physical Exam: BP (!) 142/89   Pulse 72   Temp 98.4 F (36.9 C) (Oral)   Ht 5' 11.5" (1.816 m)   Wt 198 lb 3.2 oz (89.9 kg)   BMI 27.26 kg/m  General:   Alert and oriented. Pleasant and cooperative. Well-nourished and well-developed.  Head:  Normocephalic and atraumatic. Eyes:  Without icterus, sclera clear and conjunctiva pink.  Ears:  Normal auditory acuity. Nose:  No deformity, discharge,  or lesions. Mouth:  No deformity or lesions, oral mucosa pink.  Lungs:  Clear to auscultation bilaterally. No wheezes, rales, or rhonchi. No distress.  Heart:  S1, S2 present without murmurs appreciated.  Abdomen:  +BS, soft, TTP epigastric/RUQ and non-distended. No HSM noted. No guarding or rebound. No masses appreciated.  Rectal:  Deferred  Msk:  Symmetrical without gross deformities. Normal posture. Extremities:  Without  edema. Neurologic:  Alert and  oriented x4;  grossly normal neurologically. Psych:  Alert and cooperative. Normal mood and affect.  Lab Results  Component Value Date   WBC 9.1 10/23/2016   HGB 12.4 10/23/2016   HCT 37.9 10/23/2016   MCV 82.6 10/23/2016   PLT 331 10/23/2016   Lab Results  Component Value Date   ALT 37 (H) 10/23/2016   AST 33 (H) 10/23/2016   ALKPHOS 57 10/23/2016   BILITOT 0.8 10/23/2016   Lab Results  Component Value Date   CREATININE 0.74 10/23/2016   BUN 6 (L) 10/23/2016   NA 139 10/23/2016   K 3.9 10/23/2016   CL 104 10/23/2016   CO2 27 10/23/2016   Lab Results  Component Value Date   TSH 1.53 06/05/2016

## 2016-11-05 ENCOUNTER — Other Ambulatory Visit: Payer: Self-pay

## 2016-11-05 ENCOUNTER — Ambulatory Visit (HOSPITAL_COMMUNITY)
Admission: RE | Admit: 2016-11-05 | Discharge: 2016-11-05 | Disposition: A | Discharge status: Home / Self Care | Payer: 59 | Source: Ambulatory Visit | Attending: Gastroenterology | Admitting: Gastroenterology

## 2016-11-05 ENCOUNTER — Ambulatory Visit (HOSPITAL_COMMUNITY): Payer: Medicare Other

## 2016-11-05 ENCOUNTER — Telehealth: Payer: Self-pay | Admitting: Gastroenterology

## 2016-11-05 DIAGNOSIS — R1011 Right upper quadrant pain: Secondary | ICD-10-CM

## 2016-11-05 DIAGNOSIS — R1033 Periumbilical pain: Secondary | ICD-10-CM | POA: Insufficient documentation

## 2016-11-05 DIAGNOSIS — R110 Nausea: Secondary | ICD-10-CM | POA: Insufficient documentation

## 2016-11-05 DIAGNOSIS — F331 Major depressive disorder, recurrent, moderate: Secondary | ICD-10-CM | POA: Diagnosis not present

## 2016-11-05 DIAGNOSIS — R109 Unspecified abdominal pain: Secondary | ICD-10-CM

## 2016-11-05 NOTE — Progress Notes (Signed)
No gallstones or thickening of gallbladder wall. She has positive Murphy's sign. We need a HIDA scan with [facility] [facility]ALLEN[employer].

## 2016-11-05 NOTE — Telephone Encounter (Signed)
Pt called to say that she had U/S done this morning and radiology told her that we didn't send everything regarding her insurance and that she would be double billed. I verified both insurances with her and told her that radiology is on epic and should've been able to see the same thing regarding her insurances. I told her that I would let the referral coordinator be aware of what they said. [phone]

## 2016-11-05 NOTE — Telephone Encounter (Deleted)
Referral has been made.

## 2016-11-06 ENCOUNTER — Ambulatory Visit: Payer: Medicare Other | Admitting: Family Medicine

## 2016-11-06 NOTE — Assessment & Plan Note (Signed)
History of chronic abdominal pain, constipation, now with new onset epigastric/RUQ pain that seems to correlate with meals. Associated nausea. Not taking PPI as prescribed, as she states she did not feel better with it. I discussed taking longer than a week, as it could take up to 14 days for max effect. Resume Nexium, US abdomen ordered (very mildly elevated LFTs) to evaluate for biliary component. May need HIDA. EGD in 2013 with gastritis and dilation of esophageal ring; she denies dysphagia. Doubt EGD would be helpful in this scenario unless clinical condition changes.    For constipation: Linzess not helpful. Add supplemental fiber. May need to trial Amitiza. Return in 4 weeks. Consider updated colnoscopy due to history of low-volume hematochezia, although she does have a history of internal hemorrhoids. Need to monitor for any further episodes. CBC normal.

## 2016-11-06 NOTE — Telephone Encounter (Signed)
Tired to call with no answer

## 2016-11-06 NOTE — Patient Instructions (Signed)
Attempted to submit PA for HIDA via [facility]. No PA needed.

## 2016-11-07 ENCOUNTER — Ambulatory Visit (HOSPITAL_COMMUNITY): Payer: Medicare Other

## 2016-11-10 NOTE — Progress Notes (Signed)
CC'D TO PCP °

## 2016-11-11 ENCOUNTER — Encounter (HOSPITAL_COMMUNITY): Payer: Self-pay

## 2016-11-11 ENCOUNTER — Encounter (HOSPITAL_COMMUNITY)
Admission: RE | Admit: 2016-11-11 | Discharge: 2016-11-11 | Disposition: A | Discharge status: Home / Self Care | Payer: 59 | Source: Ambulatory Visit | Attending: Gastroenterology | Admitting: Gastroenterology

## 2016-11-11 DIAGNOSIS — R1084 Generalized abdominal pain: Secondary | ICD-10-CM | POA: Diagnosis not present

## 2016-11-11 DIAGNOSIS — R1011 Right upper quadrant pain: Secondary | ICD-10-CM | POA: Insufficient documentation

## 2016-11-11 MED ORDER — TECHNETIUM TC 99M MEBROFENIN IV KIT
5.0000 | PACK | Freq: Once | INTRAVENOUS | Status: AC | PRN
  Administered 2016-11-11: 5 via INTRAVENOUS

## 2016-11-11 MED ORDER — TECHNETIUM TC 99M DISOFENIN: 5.0000 | Freq: Once | Status: DC | PRN

## 2016-11-14 DIAGNOSIS — F331 Major depressive disorder, recurrent, moderate: Secondary | ICD-10-CM | POA: Diagnosis not present

## 2016-11-17 ENCOUNTER — Encounter: Payer: Self-pay | Admitting: Family Medicine

## 2016-11-17 ENCOUNTER — Ambulatory Visit (INDEPENDENT_AMBULATORY_CARE_PROVIDER_SITE_OTHER): Payer: 59 | Admitting: Family Medicine

## 2016-11-17 DIAGNOSIS — I10 Essential (primary) hypertension: Secondary | ICD-10-CM | POA: Diagnosis not present

## 2016-11-17 DIAGNOSIS — R109 Unspecified abdominal pain: Secondary | ICD-10-CM

## 2016-11-17 DIAGNOSIS — E663 Overweight: Secondary | ICD-10-CM

## 2016-11-17 MED ORDER — HYDROCHLOROTHIAZIDE 25 MG PO TABS: 12.5000 mg | ORAL_TABLET | Freq: Every day | ORAL | 3 refills | Status: DC

## 2016-11-17 MED ORDER — POLYETHYLENE GLYCOL 3350 17 GM/SCOOP PO POWD: 17.0000 g | Freq: Every day | ORAL | 1 refills | Status: DC | PRN

## 2016-11-17 NOTE — Progress Notes (Signed)
Low normal gallbladder EF. Follow low fat diet for now. We will see her April 27 to discuss further evaluation. May need elective referral for cholecystectomy due to low normal EF. Will assess symptoms in near future.

## 2016-11-17 NOTE — Patient Instructions (Addendum)
Nice to meet you. We will start you on MiraLAX for constipation. We will change your blood pressure medication from amlodipine to hydrochlorothiazide. Please start checking your blood pressure at home. We'll you see back in 2 weeks for nurse visit for blood pressure check and lab work. We'll get you to see a dietitian as well. If you have worsening abdominal discomfort, blood in her stool, or any new or changing symptoms please seek medical attention immediately.

## 2016-11-17 NOTE — Assessment & Plan Note (Signed)
Relatively well controlled. Discussed switching her from amlodipine to hydrochlorothiazide to see if that would help with her constipation. She was not quite sure if she wanted to do this given potential for very low risk of photosensitivity with hydrochlorothiazide. She will consider this and let us know if she wants to make the switch. Hydrochlorothiazide was sent to the pharmacy. If she does not want to make the switch she will contact us and we can send and amlodipine for her to continue.

## 2016-11-17 NOTE — Progress Notes (Signed)
Tommi Rumps, MD Phone: [phone]  [name] is a 41 y.o. female who presents today for new patient visit.  Abdominal pain: Patient notes she is being followed by gastroenterology for this. Has had epigastric and right upper quadrant discomfort. It is dull all the time and then worsens after she eats at times. She's had an ultrasound with no gallstones or thickening of the gallbladder wall though had positive Murphy sign and HIDA scan which was unremarkable. She has a bowel movement every 3 days. Fluctuates between constipation and diarrhea. She did have some blood 2 weeks ago though does have hemorrhoids. GI is aware of this. She notes some nausea. No vomiting. No dysuria. No vaginal discharge. She was on a reflux medicine though this did not help. She is up-to-date on colonoscopy and she has had an EGD. She has tried Linzess in the past though she reports weight gain with this.  Hypertension: Taking amlodipine. No chest pain, shortness breath, or edema. Notes lisinopril caused cough.  Patient has been gaining some weight. She notes she exercises 4 times a week. She's not been eating quite as much given her abdominal issues. She was supposed to be referred to a dietitian previously.  Active Ambulatory Problems    Diagnosis Date Noted  . MIGRAINE HEADACHE 05/13/2006  . Hypertension 03/20/2010  . Acetabulum intrapelvic protrusion into pelvic region or thigh 05/15/2011  . Hiatal hernia 02/20/2012  . Headache 12/17/2015  . Abdominal pain 11/04/2016  . Overweight 11/17/2016   Resolved Ambulatory Problems    Diagnosis Date Noted  . Vitamin D deficiency 08/20/2009  . ANXIETY 05/18/2009  . MENOPAUSE, SURGICAL 08/20/2009  . OSTEOARTHRITIS, HIP 05/13/2006  . STIFFNESS OF JOINT NEC ANKLE AND FOOT 04/09/2009  . Open fracture of upper end of fibula with tibia 03/05/2009  . Closed fracture of shaft of tibia 03/05/2009  . Back muscle spasm 08/15/2010  . Abdominal pain, generalized  09/06/2010  . Preventative health care 10/03/2010  . Hot flashes due to surgical menopause 03/17/2011  . Urinary tract infection 03/17/2011  . Strep pharyngitis 06/19/2011  . Esophageal dysphagia 06/25/2011  . Nausea 06/25/2011  . Anemia 06/27/2011  . Hip pain 07/08/2011  . Abdominal pain, acute, left lower quadrant 09/04/2011  . Bilateral upper abdominal pain 09/04/2011  . Pain, dental 10/29/2011  . Fatigue 12/19/2011  . Difficulty sleeping 12/19/2011  . Dizziness 02/20/2012  . Hot flashes 02/20/2012  . Chronic gastritis (mild) 02/20/2012  . Chronic hip pain 02/20/2012  . Allergic sinusitis 06/25/2012  . Dysuria 09/20/2012  . Bilateral flank pain 09/30/2012  . Constipation 10/12/2012  . Pain of left side of body 12/30/2012  . UTI (urinary tract infection) 03/28/2013  . Bunion, right foot 07/28/2013  . Chronic pain syndrome (migraines, hips, left ankle, b/l knees) 03/03/2014  . Eczema 03/03/2014  . Soft tissue infection, index finger right hand 04/25/2014  . Dysuria 11/02/2014  . Cough due to ACE inhibitor 11/02/2014  . Acute abdominal pain 01/30/2015  . Puncture wound of right foot 05/18/2015  . Pain of left calf 11/14/2015  . Abdominal pain, epigastric 12/17/2015  . Pain of upper abdomen 11/04/2016   Past Medical History:  Diagnosis Date  . Abdominal pain, epigastric 12/17/2015  . Anxiety   . BCC (basal cell carcinoma of skin)   . Bunion, right foot   . Chronic cough   . Closed fibular fracture 10/2008  . Duodenitis   . Endometriosis   . Epigastric pain   . Esophageal ring   .  Gastritis   . Headache(784.0)   . Hidradenitis suppurativa 05/2006  . Hip deformity, congenital   . History of migraine headaches   . Hypertension   . Internal hemorrhoids   . Osteoarthritis of hip   . Ovarian cancer ([place]) 1995  . Protrusio acetabuli   . Sessile colonic polyp   . Surgical menopause 08/2007  . Tibial fracture 10/2008  . Vitamin D deficiency 11/2009    Family  History  Problem Relation Age of Onset  . Other Father     deceased while in service.   . Breast cancer Sister 79    remission  . Cancer Sister     breast  . Lupus Cousin   . Colon cancer Neg Hx   . Liver disease Neg Hx     Social History   Social History  . Marital status: Married    Spouse name: N/A  . Number of children: 2  . Years of education: bs degree   Occupational History  . 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 Smoking status: Never Smoker  . Smokeless tobacco: Never Used  . Alcohol use No  . Drug use: No  . Sexual activity: Not on file   Other Topics Concern  . Not on file   Social History Narrative   Insurance: Medicare.   Patient is married with two children, [place] and [place].           ROS  General:  Negative for nexplained weight loss, fever Skin: Negative for new or changing mole, sore that won't heal HEENT: Negative for trouble hearing, trouble seeing, ringing in ears, mouth sores, hoarseness, change in voice, dysphagia. CV:  Negative for chest pain, dyspnea, edema, palpitations Resp: Negative for cough, dyspnea, hemoptysis GI: Positive for nausea, diarrhea, constipation, abdominal pain, Negative for vomiting, melena, hematochezia. GU: Negative for dysuria, incontinence, urinary hesitance, hematuria, vaginal or penile discharge, polyuria, sexual difficulty, lumps in testicle or breasts MSK: Negative for muscle cramps or aches, joint pain or swelling Neuro: Negative for headaches, weakness, numbness, dizziness, passing out/fainting Psych: Negative for depression, anxiety, memory problems  Objective  Physical Exam Vitals:   11/17/16 0807  BP: 136/90  Pulse: 68  Temp: 98.6 F (37 C)    BP Readings from Last 3 Encounters:  11/17/16 136/90  11/04/16 (!) 142/89  10/23/16 120/80   Wt Readings from Last 3 Encounters:  11/17/16 199 lb (90.3 kg)  11/04/16 198 lb 3.2 oz (89.9 kg)  10/23/16 191 lb (86.6 kg)     Physical Exam  Constitutional: No distress.  HENT:  Head: Normocephalic and atraumatic.  Mouth/Throat: Oropharynx is clear and moist. No oropharyngeal exudate.  Eyes: Pupils are equal, round, and reactive to light.  Cardiovascular: Normal rate, regular rhythm and normal heart sounds.   Pulmonary/Chest: Effort normal and breath sounds normal.  Abdominal: Soft. Bowel sounds are normal. She exhibits no distension. There is tenderness (mild epigastric). There is no rebound and no guarding.  Musculoskeletal: She exhibits no edema.  Neurological: She is alert. Gait normal.  Skin: Skin is warm and dry. She is not diaphoretic.  Psychiatric: Mood and affect normal.     Assessment/Plan:   Abdominal pain Patient appears to have a history of chronic abdominal pain and constipation. Did have newer onset epigastric and right upper quadrant discomfort that was felt to correlate some degree with meals. She had been on a PPI though is unsure that this is beneficial. She has had a  right upper quadrant ultrasound and HIDA scan. Also had a CT scan of her abdomen and pelvis. She is following with GI for this and has follow-up later this month. Symptoms could potentially be related to constipation given her reduced frequency of bowel movements. I did discuss changing her off of amlodipine to hydrochlorothiazide to see if that would help with constipation. Also discussed trying MiraLAX. She'll monitor and follow-up with GI as planned.  Hypertension Relatively well controlled. Discussed switching her from amlodipine to hydrochlorothiazide to see if that would help with her constipation. She was not quite sure if she wanted to do this given potential for very low risk of photosensitivity with hydrochlorothiazide. She will consider this and let us know if she wants to make the switch. Hydrochlorothiazide was sent to the pharmacy. If she does not want to make the switch she will contact us and we can send and  amlodipine for her to continue.  Overweight Encouraged exercise. Referred to dietitian.   No orders of the defined types were placed in this encounter.   Meds ordered this encounter  Medications  . hydrochlorothiazide (HYDRODIURIL) 25 MG tablet    Sig: Take 0.5 tablets (12.5 mg total) by mouth daily.    Dispense:  45 tablet    Refill:  3  . polyethylene glycol powder (GLYCOLAX/MIRALAX) powder    Sig: Take 17 g by mouth daily as needed.    Dispense:  3350 g    Refill:  [place], MD [place]

## 2016-11-17 NOTE — Assessment & Plan Note (Signed)
Encouraged exercise. Referred to dietitian.

## 2016-11-17 NOTE — Progress Notes (Signed)
Pre visit review using our clinic review tool, if applicable. No additional management support is needed unless otherwise documented below in the visit note. 

## 2016-11-17 NOTE — Assessment & Plan Note (Addendum)
Patient appears to have a history of chronic abdominal pain and constipation. Did have newer onset epigastric and right upper quadrant discomfort that was felt to correlate some degree with meals. She had been on a PPI though is unsure that this is beneficial. She has had a right upper quadrant ultrasound and HIDA scan. Also had a CT scan of her abdomen and pelvis. She is following with GI for this and has follow-up later this month. Symptoms could potentially be related to constipation given her reduced frequency of bowel movements. I did discuss changing her off of amlodipine to hydrochlorothiazide to see if that would help with constipation. Also discussed trying MiraLAX. She'll monitor and follow-up with GI as planned.

## 2016-11-18 NOTE — Progress Notes (Signed)
Removed pcp.  Pt has upcoming appt in October.  Called pt home # disconnected.  Call pt work [employer], [employer].

## 2016-11-18 NOTE — Progress Notes (Signed)
PT is aware.

## 2016-11-20 ENCOUNTER — Ambulatory Visit: Payer: BLUE CROSS/BLUE SHIELD | Admitting: Gastroenterology

## 2016-11-21 DIAGNOSIS — F331 Major depressive disorder, recurrent, moderate: Secondary | ICD-10-CM | POA: Diagnosis not present

## 2016-11-24 DIAGNOSIS — F331 Major depressive disorder, recurrent, moderate: Secondary | ICD-10-CM | POA: Diagnosis not present

## 2016-11-25 NOTE — Progress Notes (Signed)
REVIEWED-NO ADDITIONAL RECOMMENDATIONS. 

## 2016-12-02 ENCOUNTER — Telehealth: Payer: Self-pay | Admitting: *Deleted

## 2016-12-02 DIAGNOSIS — E663 Overweight: Secondary | ICD-10-CM

## 2016-12-02 NOTE — Telephone Encounter (Signed)
Please advise 

## 2016-12-02 NOTE — Telephone Encounter (Signed)
Patient stated that she was to receive a referral to a dietician, however she has not received a call to schedule. Pt contact [phone]

## 2016-12-03 ENCOUNTER — Other Ambulatory Visit: Payer: Self-pay | Admitting: Family Medicine

## 2016-12-03 DIAGNOSIS — F331 Major depressive disorder, recurrent, moderate: Secondary | ICD-10-CM | POA: Diagnosis not present

## 2016-12-03 NOTE — Telephone Encounter (Signed)
Order placed

## 2016-12-04 NOTE — Telephone Encounter (Signed)
noted 

## 2016-12-05 ENCOUNTER — Ambulatory Visit: Payer: Medicare Other | Admitting: Gastroenterology

## 2016-12-10 DIAGNOSIS — F331 Major depressive disorder, recurrent, moderate: Secondary | ICD-10-CM | POA: Diagnosis not present

## 2016-12-17 DIAGNOSIS — Z471 Aftercare following joint replacement surgery: Secondary | ICD-10-CM | POA: Diagnosis not present

## 2016-12-17 DIAGNOSIS — Z09 Encounter for follow-up examination after completed treatment for conditions other than malignant neoplasm: Secondary | ICD-10-CM | POA: Diagnosis not present

## 2016-12-17 DIAGNOSIS — Z96642 Presence of left artificial hip joint: Secondary | ICD-10-CM | POA: Diagnosis not present

## 2016-12-17 DIAGNOSIS — Z96641 Presence of right artificial hip joint: Secondary | ICD-10-CM | POA: Diagnosis not present

## 2016-12-17 DIAGNOSIS — Z96643 Presence of artificial hip joint, bilateral: Secondary | ICD-10-CM | POA: Diagnosis not present

## 2016-12-22 ENCOUNTER — Encounter: Payer: Self-pay | Admitting: Family Medicine

## 2016-12-22 ENCOUNTER — Ambulatory Visit (INDEPENDENT_AMBULATORY_CARE_PROVIDER_SITE_OTHER): Payer: 59 | Admitting: Family Medicine

## 2016-12-22 VITALS — BP 124/80 | HR 57 | Temp 97.9°F | Wt 195.4 lb

## 2016-12-22 DIAGNOSIS — I10 Essential (primary) hypertension: Secondary | ICD-10-CM | POA: Diagnosis not present

## 2016-12-22 DIAGNOSIS — R1010 Upper abdominal pain, unspecified: Secondary | ICD-10-CM | POA: Diagnosis not present

## 2016-12-22 DIAGNOSIS — J019 Acute sinusitis, unspecified: Secondary | ICD-10-CM | POA: Diagnosis not present

## 2016-12-22 MED ORDER — AMOXICILLIN 875 MG PO TABS: 875.0000 mg | ORAL_TABLET | Freq: Two times a day (BID) | ORAL | 0 refills | Status: DC

## 2016-12-22 NOTE — Patient Instructions (Addendum)
Goal BP is <130/80.    Sinusitis, Adult Sinusitis is soreness and inflammation of your sinuses. Sinuses are hollow spaces in the bones around your face. Your sinuses are located:  Around your eyes.  In the middle of your forehead.  Behind your nose.  In your cheekbones. Your sinuses and nasal passages are lined with a stringy fluid (mucus). Mucus normally drains out of your sinuses. When your nasal tissues become inflamed or swollen, the mucus can become trapped or blocked so air cannot flow through your sinuses. This allows bacteria, viruses, and funguses to grow, which leads to infection. Sinusitis can develop quickly and last for 7?10 days (acute) or for more than 12 weeks (chronic). Sinusitis often develops after a cold. What are the causes? This condition is caused by anything that creates swelling in the sinuses or stops mucus from draining, including:  Allergies.  Asthma.  Bacterial or viral infection.  Abnormally shaped bones between the nasal passages.  Nasal growths that contain mucus (nasal polyps).  Narrow sinus openings.  Pollutants, such as chemicals or irritants in the air.  A foreign object stuck in the nose.  A fungal infection. This is rare. What increases the risk? The following factors may make you more likely to develop this condition:  Having allergies or asthma.  Having had a recent cold or respiratory tract infection.  Having structural deformities or blockages in your nose or sinuses.  Having a weak immune system.  Doing a lot of swimming or diving.  Overusing nasal sprays.  Smoking. What are the signs or symptoms? The main symptoms of this condition are pain and a feeling of pressure around the affected sinuses. Other symptoms include:  Upper toothache.  Earache.  Headache.  Bad breath.  Decreased sense of smell and taste.  A cough that may get worse at night.  Fatigue.  Fever.  Thick drainage from your nose. The drainage  is often green and it may contain pus (purulent).  Stuffy nose or congestion.  Postnasal drip. This is when extra mucus collects in the throat or back of the nose.  Swelling and warmth over the affected sinuses.  Sore throat.  Sensitivity to light. How is this diagnosed? This condition is diagnosed based on symptoms, a medical history, and a physical exam. To find out if your condition is acute or chronic, your health care provider may:  Look in your nose for signs of nasal polyps.  Tap over the affected sinus to check for signs of infection.  View the inside of your sinuses using an imaging device that has a light attached (endoscope). If your health care provider suspects that you have chronic sinusitis, you may also:  Be tested for allergies.  Have a sample of mucus taken from your nose (nasal culture) and checked for bacteria.  Have a mucus sample examined to see if your sinusitis is related to an allergy. If your sinusitis does not respond to treatment and it lasts longer than 8 weeks, you may have an MRI or CT scan to check your sinuses. These scans also help to determine how severe your infection is. In rare cases, a bone biopsy may be done to rule out more serious types of fungal sinus disease. How is this treated? Treatment for sinusitis depends on the cause and whether your condition is chronic or acute. If a virus is causing your sinusitis, your symptoms will go away on their own within 10 days. You may be given medicines to relieve your  symptoms, including:  Topical nasal decongestants. They shrink swollen nasal passages and let mucus drain from your sinuses.  Antihistamines. These drugs block inflammation that is triggered by allergies. This can help to ease swelling in your nose and sinuses.  Topical nasal corticosteroids. These are nasal sprays that ease inflammation and swelling in your nose and sinuses.  Nasal saline washes. These rinses can help to get rid of  thick mucus in your nose. If your condition is caused by bacteria, you will be given an antibiotic medicine. If your condition is caused by a fungus, you will be given an antifungal medicine. Surgery may be needed to correct underlying conditions, such as narrow nasal passages. Surgery may also be needed to remove polyps. Follow these instructions at home: Medicines   Take, use, or apply over-the-counter and prescription medicines only as told by your health care provider. These may include nasal sprays.  If you were prescribed an antibiotic medicine, take it as told by your health care provider. Do not stop taking the antibiotic even if you start to feel better. Hydrate and Humidify   Drink enough water to keep your urine clear or pale yellow. Staying hydrated will help to thin your mucus.  Use a cool mist humidifier to keep the humidity level in your home above 50%.  Inhale steam for 10-15 minutes, 3-4 times a day or as told by your health care provider. You can do this in the bathroom while a hot shower is running.  Limit your exposure to cool or dry air. Rest   Rest as much as possible.  Sleep with your head raised (elevated).  Make sure to get enough sleep each night. General instructions   Apply a warm, moist washcloth to your face 3-4 times a day or as told by your health care provider. This will help with discomfort.  Wash your hands often with soap and water to reduce your exposure to viruses and other germs. If soap and water are not available, use hand sanitizer.  Do not smoke. Avoid being around people who are smoking (secondhand smoke).  Keep all follow-up visits as told by your health care provider. This is important. Contact a health care provider if:  You have a fever.  Your symptoms get worse.  Your symptoms do not improve within 10 days. Get help right away if:  You have a severe headache.  You have persistent vomiting.  You have pain or swelling  around your face or eyes.  You have vision problems.  You develop confusion.  Your neck is stiff.  You have trouble breathing. This information is not intended to replace advice given to you by your health care provider. Make sure you discuss any questions you have with your health care provider. Document Released: 07/28/2005 Document Revised: 03/23/2016 Document Reviewed: 05/23/2015 Elsevier Interactive Patient Education  2017 [place] DASH stands for "Dietary Approaches to Stop Hypertension." The DASH eating plan is a healthy eating plan that has been shown to reduce high blood pressure (hypertension). It may also reduce your risk for type 2 diabetes, heart disease, and stroke. The DASH eating plan may also help with weight loss. What are tips for following this plan? General guidelines   Avoid eating more than 2,300 mg (milligrams) of salt (sodium) a day. If you have hypertension, you may need to reduce your sodium intake to 1,500 mg a day.  Limit alcohol intake to no more than 1 drink a  day for nonpregnant women and 2 drinks a day for men. One drink equals 12 oz of beer, 5 oz of wine, or 1 oz of hard liquor.  Work with your health care provider to maintain a healthy body weight or to lose weight. Ask what an ideal weight is for you.  Get at least 30 minutes of exercise that causes your heart to beat faster (aerobic exercise) most days of the week. Activities may include walking, swimming, or biking.  Work with your health care provider or diet and nutrition specialist (dietitian) to adjust your eating plan to your individual calorie needs. Reading food labels   Check food labels for the amount of sodium per serving. Choose foods with less than 5 percent of the Daily Value of sodium. Generally, foods with less than 300 mg of sodium per serving fit into this eating plan.  To find whole grains, look for the word "whole" as the first word in the ingredient  list. Shopping   Buy products labeled as "low-sodium" or "no salt added."  Buy fresh foods. Avoid canned foods and premade or frozen meals. Cooking   Avoid adding salt when cooking. Use salt-free seasonings or herbs instead of table salt or sea salt. Check with your health care provider or pharmacist before using salt substitutes.  Do not fry foods. Cook foods using healthy methods such as baking, boiling, grilling, and broiling instead.  Cook with heart-healthy oils, such as olive, canola, soybean, or sunflower oil. Meal planning    Eat a balanced diet that includes:  5 or more servings of fruits and vegetables each day. At each meal, try to fill half of your plate with fruits and vegetables.  Up to 6-8 servings of whole grains each day.  Less than 6 oz of lean meat, poultry, or fish each day. A 3-oz serving of meat is about the same size as a deck of cards. One egg equals 1 oz.  2 servings of low-fat dairy each day.  A serving of nuts, seeds, or beans 5 times each week.  Heart-healthy fats. Healthy fats called Omega-3 fatty acids are found in foods such as flaxseeds and coldwater fish, like sardines, salmon, and mackerel.  Limit how much you eat of the following:  Canned or prepackaged foods.  Food that is high in trans fat, such as fried foods.  Food that is high in saturated fat, such as fatty meat.  Sweets, desserts, sugary drinks, and other foods with added sugar.  Full-fat dairy products.  Do not salt foods before eating.  Try to eat at least 2 vegetarian meals each week.  Eat more home-cooked food and less restaurant, buffet, and fast food.  When eating at a restaurant, ask that your food be prepared with less salt or no salt, if possible. What foods are recommended? The items listed may not be a complete list. Talk with your dietitian about what dietary choices are best for you. Grains  Whole-grain or whole-wheat bread. Whole-grain or whole-wheat pasta.  Brown rice. [place]. Bulgur. Whole-grain and low-sodium cereals. Pita bread. Low-fat, low-sodium crackers. Whole-wheat flour tortillas. Vegetables  Fresh or frozen vegetables (raw, steamed, roasted, or grilled). Low-sodium or reduced-sodium tomato and vegetable juice. Low-sodium or reduced-sodium tomato sauce and tomato paste. Low-sodium or reduced-sodium canned vegetables. Fruits  All fresh, dried, or frozen fruit. Canned fruit in natural juice (without added sugar). Meat and other protein foods  Skinless chicken or [place]. Ground chicken or [place]. Pork with fat trimmed off.  Fish and seafood. Egg whites. Dried beans, peas, or lentils. Unsalted nuts, nut butters, and seeds. Unsalted canned beans. Lean cuts of beef with fat trimmed off. Low-sodium, lean deli meat. Dairy  Low-fat (1%) or fat-free (skim) milk. Fat-free, low-fat, or reduced-fat cheeses. Nonfat, low-sodium ricotta or cottage cheese. Low-fat or nonfat yogurt. Low-fat, low-sodium cheese. Fats and oils  Soft margarine without trans fats. Vegetable oil. Low-fat, reduced-fat, or light mayonnaise and salad dressings (reduced-sodium). Canola, safflower, olive, soybean, and sunflower oils. Avocado. Seasoning and other foods  Herbs. Spices. Seasoning mixes without salt. Unsalted popcorn and pretzels. Fat-free sweets. What foods are not recommended? The items listed may not be a complete list. Talk with your dietitian about what dietary choices are best for you. Grains  Baked goods made with fat, such as croissants, muffins, or some breads. Dry pasta or rice meal packs. Vegetables  Creamed or fried vegetables. Vegetables in a cheese sauce. Regular canned vegetables (not low-sodium or reduced-sodium). Regular canned tomato sauce and paste (not low-sodium or reduced-sodium). Regular tomato and vegetable juice (not low-sodium or reduced-sodium). Angie Fava. Olives. Fruits  Canned fruit in a light or heavy syrup. Fried fruit. Fruit in cream  or butter sauce. Meat and other protein foods  Fatty cuts of meat. Ribs. Fried meat. Berniece Salines. Sausage. Bologna and other processed lunch meats. Salami. Fatback. Hotdogs. Bratwurst. Salted nuts and seeds. Canned beans with added salt. Canned or smoked fish. Whole eggs or egg yolks. Chicken or [place] with skin. Dairy  Whole or 2% milk, cream, and half-and-half. Whole or full-fat cream cheese. Whole-fat or sweetened yogurt. Full-fat cheese. Nondairy creamers. Whipped toppings. Processed cheese and cheese spreads. Fats and oils  Butter. Stick margarine. Lard. Shortening. Ghee. Bacon fat. Tropical oils, such as coconut, palm kernel, or palm oil. Seasoning and other foods  Salted popcorn and pretzels. Onion salt, garlic salt, seasoned salt, table salt, and sea salt. Worcestershire sauce. Tartar sauce. Barbecue sauce. Teriyaki sauce. Soy sauce, including reduced-sodium. Steak sauce. Canned and packaged gravies. Fish sauce. Oyster sauce. Cocktail sauce. Horseradish that you find on the shelf. Ketchup. Mustard. Meat flavorings and tenderizers. Bouillon cubes. Hot sauce and Tabasco sauce. Premade or packaged marinades. Premade or packaged taco seasonings. Relishes. Regular salad dressings. Where to find more information:  National Heart, Lung, and [place]: https://wilson-eaton.com/  American Heart Association: www.heart.org Summary  The DASH eating plan is a healthy eating plan that has been shown to reduce high blood pressure (hypertension). It may also reduce your risk for type 2 diabetes, heart disease, and stroke.  With the DASH eating plan, you should limit salt (sodium) intake to 2,300 mg a day. If you have hypertension, you may need to reduce your sodium intake to 1,500 mg a day.  When on the DASH eating plan, aim to eat more fresh fruits and vegetables, whole grains, lean proteins, low-fat dairy, and heart-healthy fats.  Work with your health care provider or diet and nutrition specialist  (dietitian) to adjust your eating plan to your individual calorie needs. This information is not intended to replace advice given to you by your health care provider. Make sure you discuss any questions you have with your health care provider. Document Released: 07/17/2011 Document Revised: 07/21/2016 Document Reviewed: 07/21/2016 Elsevier Interactive Patient Education  2017 [employer].

## 2016-12-22 NOTE — Progress Notes (Signed)
Subjective: Chief Complaint  Patient presents with  . follow-up    2.5 weeks nasally, headache,sinus pressure, taken otc to help and no relief. stomach issues- still there but not as bad     [name] is a 41 y.o. female who presents for a follow up on abdominal pain and with a new complaint of possible sinus infection.  Symptoms include a 2.5 week history of nasal congestion, sinus pressure, post nasal drainage, mild sore throat. dry cough.  Denies fever ,chills, ear pain, chest pain, palpitations, shortness of breath, nausea, vomiting, diarrhea. No LE edema.   Past history is significant for no history of pneumonia or bronchitis. No history of allergies.  Patient is a non-smoker.  Using Mucinex DM and allegra for symptoms.  Denies sick contacts.  No other aggravating or relieving factors.    States she is taking amlodipine 10 mg and not HCTZ for the past almost a year. States she would like to stop taking medication.  Checks BP occasionally.   In regards to her stomach issues, she has seen GI and has made some changes to her diet and skipping beef. States this usually controls her pain. She is not taking PPI. States she does not like to take medication.   ROS as in subjective   Objective: Vitals:   12/22/16 0841  BP: 124/80  Pulse: (!) 57  Temp: 97.9 F (36.6 C)    General appearance: Alert, WD/WN, no distress                             Skin: warm, no rash                           Head: + frontal and maxillary sinus tenderness,                            Eyes: conjunctiva normal, corneas clear, PERRLA                            Ears: pearly TMs, external ear canals normal                          Nose: septum midline, turbinates swollen, with erythema and thick discharge             Mouth/throat: MMM, tongue normal, mild pharyngeal erythema                           Neck: supple, no adenopathy, no thyromegaly, nontender                          Heart: RRR, normal S1,  S2, no murmurs                         Lungs: CTA bilaterally, no wheezes, rales, or rhonchi   Extremities: no edema, normal pulses       Assessment and Plan:  Acute sinusitis with symptoms > 10 days  Essential hypertension  Pain of upper abdomen   Prescription sent for Amoxicillin.  Can use OTC Mucinex for congestion.  Tylenol or Ibuprofen OTC for fever and malaise.  Discussed symptomatic relief, nasal saline flush, and call or return  if worse or not back to baseline after completing the antibiotic.  Discussed that we can only manage HTN and not cure it. Recommend she continue checking her BP at home especially if she decides to stop her medication. I advised against this.  Abdominal pain appears to be manageable and not bothersome when she avoids red meat. She will continue seeing GI as needed for this.  Follow up for annual exam later this fall or sooner if needed.

## 2016-12-31 DIAGNOSIS — F331 Major depressive disorder, recurrent, moderate: Secondary | ICD-10-CM | POA: Diagnosis not present

## 2017-01-07 DIAGNOSIS — F331 Major depressive disorder, recurrent, moderate: Secondary | ICD-10-CM | POA: Diagnosis not present

## 2017-02-04 DIAGNOSIS — F331 Major depressive disorder, recurrent, moderate: Secondary | ICD-10-CM | POA: Diagnosis not present

## 2017-02-12 ENCOUNTER — Telehealth: Payer: Self-pay | Admitting: Family Medicine

## 2017-02-12 MED ORDER — AMLODIPINE BESYLATE 5 MG PO TABS: 5.0000 mg | ORAL_TABLET | Freq: Every day | ORAL | 1 refills | Status: DC

## 2017-02-12 NOTE — Telephone Encounter (Signed)
done

## 2017-02-12 NOTE — Telephone Encounter (Signed)
Pt called for refills of Amlodipine. We have not filled this before but is on historical meds. Please send to [facility] in [place]. Pt can be reached at [phone].

## 2017-02-12 NOTE — Telephone Encounter (Signed)
Ok

## 2017-02-25 DIAGNOSIS — F331 Major depressive disorder, recurrent, moderate: Secondary | ICD-10-CM | POA: Diagnosis not present

## 2017-02-26 ENCOUNTER — Telehealth: Payer: Self-pay | Admitting: Family Medicine

## 2017-02-26 MED ORDER — AMLODIPINE BESYLATE 10 MG PO TABS
10.0000 mg | ORAL_TABLET | Freq: Every day | ORAL | 1 refills | Status: DC
Start: 2017-02-26 — End: 2017-09-02

## 2017-02-26 NOTE — Telephone Encounter (Signed)
Called and left message that we switched bp med back to 10mg  as im not sure how it got switched

## 2017-02-26 NOTE — Telephone Encounter (Signed)
Pt called stating that she should be on Amlodipine 10 mg. Pt would like a call at [phone] about this to explain further. Requesting that script be corrected at [facility].

## 2017-02-26 NOTE — Telephone Encounter (Signed)
Please fix this for her. I do see that at our initial appt she was taking 10 mg amlodipine. Not sure how this got switched to 5 mg tablets. Thanks.

## 2017-03-10 DIAGNOSIS — F331 Major depressive disorder, recurrent, moderate: Secondary | ICD-10-CM | POA: Diagnosis not present

## 2017-03-18 DIAGNOSIS — Z1272 Encounter for screening for malignant neoplasm of vagina: Secondary | ICD-10-CM | POA: Diagnosis not present

## 2017-03-18 DIAGNOSIS — Z01419 Encounter for gynecological examination (general) (routine) without abnormal findings: Secondary | ICD-10-CM | POA: Diagnosis not present

## 2017-03-18 DIAGNOSIS — Z90710 Acquired absence of both cervix and uterus: Secondary | ICD-10-CM | POA: Diagnosis not present

## 2017-03-18 DIAGNOSIS — N951 Menopausal and female climacteric states: Secondary | ICD-10-CM | POA: Diagnosis not present

## 2017-03-24 DIAGNOSIS — F331 Major depressive disorder, recurrent, moderate: Secondary | ICD-10-CM | POA: Diagnosis not present

## 2017-04-08 DIAGNOSIS — F331 Major depressive disorder, recurrent, moderate: Secondary | ICD-10-CM | POA: Diagnosis not present

## 2017-04-21 DIAGNOSIS — F331 Major depressive disorder, recurrent, moderate: Secondary | ICD-10-CM | POA: Diagnosis not present

## 2017-05-13 DIAGNOSIS — F331 Major depressive disorder, recurrent, moderate: Secondary | ICD-10-CM | POA: Diagnosis not present

## 2017-05-27 DIAGNOSIS — F331 Major depressive disorder, recurrent, moderate: Secondary | ICD-10-CM | POA: Diagnosis not present

## 2017-06-02 DIAGNOSIS — F331 Major depressive disorder, recurrent, moderate: Secondary | ICD-10-CM | POA: Diagnosis not present

## 2017-06-03 ENCOUNTER — Ambulatory Visit (HOSPITAL_COMMUNITY)
Admission: EM | Admit: 2017-06-03 | Discharge: 2017-06-03 | Disposition: A | Discharge status: Home / Self Care | Payer: 59 | Attending: Family Medicine | Admitting: Family Medicine

## 2017-06-03 ENCOUNTER — Encounter (HOSPITAL_COMMUNITY): Payer: Self-pay | Admitting: Emergency Medicine

## 2017-06-03 DIAGNOSIS — L739 Follicular disorder, unspecified: Secondary | ICD-10-CM | POA: Diagnosis not present

## 2017-06-03 MED ORDER — SULFAMETHOXAZOLE-TRIMETHOPRIM 800-160 MG PO TABS: 1.0000 | ORAL_TABLET | Freq: Two times a day (BID) | ORAL | 0 refills | Status: AC

## 2017-06-03 NOTE — ED Provider Notes (Signed)
[facility]   [id number] 06/03/17 Arrival Time: 1006  ASSESSMENT & PLAN:  1. Folliculitis     Meds ordered this encounter  Medications  . sulfamethoxazole-trimethoprim (BACTRIM DS,SEPTRA DS) 800-160 MG tablet    Sig: Take 1 tablet by mouth 2 (two) times daily.    Dispense:  14 tablet    Refill:  0   Will f/u in 48-72 hours if not showing improvement. Reviewed expectations re: course of current medical issues. Questions answered. Outlined signs and symptoms indicating need for more acute intervention. Patient verbalized understanding. After Visit Summary given.   SUBJECTIVE:  [name] is a 41 y.o. female who presents with complaint of a "knot" in her R axilla noticed approx one week ago. Stable but tender. No itching. No drainage. Afebrile. No OTC treatment. No h/o similar.   ROS: As per HPI.  OBJECTIVE: Vitals:   06/03/17 1031  BP: (!) 146/97  Pulse: 71  Temp: 98.6 F (37 C)  TempSrc: Oral  SpO2: 100%    General appearance: alert; no distress Lungs: clear to auscultation bilaterally Heart: regular rate and rhythm Extremities: no edema Skin: warm and dry; R axilla with <1cm area of skin thickening; no fluctuance or tenderness Psychological: alert and cooperative; normal mood and affect  Allergies  Allergen Reactions  . Pineapple Shortness Of Breath and Swelling    Has Epi pen  . Morphine And Related Itching  . Tape Swelling    Past Medical History:  Diagnosis Date  . Abdominal pain, epigastric 12/17/2015  . Anxiety    pt denies having dx of anxiety  . BCC (basal cell carcinoma of skin)    right foot   . Bunion, right foot   . Chronic cough   . Closed fibular fracture 10/2008   Left - Sustained 2/2 fall down stairs (same fall as tibular fracture). S/P internal fixation.  . Duodenitis    EGD (07/14/2011) showing chronic duodenitis, H.pyloir neg  . Endometriosis    S/P TAH and salpingo-oophorectomy, right  . Epigastric pain    Chronic, thought 2/2 gastritis, EGD (07/14/2011) showing chronic duodenitis, H.pyloir neg // Repeat EGD (09/2011) - esophagealring, sessible polyp in stomach body, hiatal hernia - rec ad probiotic, await bx  . Esophageal ring    s/p dilatation during EGD (07/2011) - Dr. Oneida Alar  . Gastritis    Thought due to chronic NSAID use 2/2 pain from her congenital hip abnormality  . Headache(784.0)    Migraines  . Hidradenitis suppurativa 05/2006  . 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 With chronic OA of hip  . History of migraine headaches    Typical symptoms - bright lights, unilateral (starting behind her left ear), throbbing .  Marland Kitchen Hypertension   . Internal hemorrhoids   . Osteoarthritis of hip   . Ovarian cancer ([place]) 1995  . Protrusio acetabuli    diagnosed at age 62  . Sessile colonic polyp    noted 09/2011 -- > rec colonoscopy in 10 years, 2023  . Surgical menopause 08/2007   On HRT. Occuring since 01/09 following TAH and R-salpingo-oophorectomy   . Tibial fracture 10/2008   Left - Sustained 2/2 fall down stairs. Patient is S/P closed internal fixation with Smith Nephew tibial nail locked proximal and distal  . Vitamin D deficiency 11/2009   Vit D level 15 in 11/2009   Social History   Social History  . Marital status: Married  Spouse name: N/A  . Number of children: 2  . Years of education: bs degree   Occupational History  . 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 Smoking status: Never Smoker  . Smokeless tobacco: Never Used  . Alcohol use No  . Drug use: No  . Sexual activity: Not on file   Other Topics Concern  . Not on file   Social History Narrative   Insurance: Medicare.   Patient is married with two children, [place] and [place].          Family History  Problem Relation Age of Onset  . Other Father        deceased while in service.   . Breast cancer Sister 81        remission  . Cancer Sister        breast  . Lupus Cousin   . Colon cancer Neg Hx   . Liver disease Neg Hx    Past Surgical History:  Procedure Laterality Date  . ABDOMINAL HYSTERECTOMY    . CESAREAN SECTION  08/2005   Primary low transverse  . COLONOSCOPY  09/22/2011   LKJ:ZPHXTAV polyp in the rectum/Internal hemorrhoids, benign path  . ESOPHAGOGASTRODUODENOSCOPY  09/22/2011   WPV:XYIA, esophageal/Sessile polyp in the body of the stomach/Hiatal hernia/ABDOMINAL PAIN LIKELY FUNCTIONAL V. POST-INFECTIOUS IBS, path: mild chronic gastritis  . FRACTURE SURGERY     Right hip  . JOINT REPLACEMENT     right hip  . LAPAROSCOPY  10/2000   Operative laparoscopy with lysis of right adnexal adhesions and uterointestinal adhesions  . left tib fib    . OTHER SURGICAL HISTORY  02/2009   Closed treatment internal fixation, left tibia with Tamala Julian and Nephew tibial nail locked proximal and distal  . TOTAL ABDOMINAL HYSTERECTOMY W/ BILATERAL SALPINGOOPHORECTOMY  08/2007   for endometriosis. With concurrent right salpingo-oophorectomy  (2009), left oophorectomy (1995)  . TUBAL LIGATION  08/2005   Right tubal ligation     Vanessa Kick, MD 06/03/17 1045

## 2017-06-03 NOTE — ED Triage Notes (Signed)
Pt reports bilateral axillary pain for over one week.  She states she feels like there may be a knot under both arms.

## 2017-06-08 ENCOUNTER — Ambulatory Visit: Payer: BLUE CROSS/BLUE SHIELD | Admitting: Family Medicine

## 2017-06-08 ENCOUNTER — Other Ambulatory Visit: Payer: 59

## 2017-06-08 DIAGNOSIS — E559 Vitamin D deficiency, unspecified: Secondary | ICD-10-CM

## 2017-06-08 DIAGNOSIS — Z0000 Encounter for general adult medical examination without abnormal findings: Secondary | ICD-10-CM

## 2017-06-08 DIAGNOSIS — R7989 Other specified abnormal findings of blood chemistry: Secondary | ICD-10-CM

## 2017-06-08 DIAGNOSIS — R945 Abnormal results of liver function studies: Secondary | ICD-10-CM

## 2017-06-09 LAB — CBC WITH DIFFERENTIAL/PLATELET
BASOS ABS: 30 {cells}/uL (ref 0–200)
Basophils Relative: 0.4 %
EOS PCT: 0.8 %
Eosinophils Absolute: 59 cells/uL (ref 15–500)
HEMATOCRIT: 35.6 % (ref 35.0–45.0)
Hemoglobin: 11.9 g/dL (ref 11.7–15.5)
Lymphs Abs: 1709 cells/uL (ref 850–3900)
MCH: 27.2 pg (ref 27.0–33.0)
MCHC: 33.4 g/dL (ref 32.0–36.0)
MCV: 81.5 fL (ref 80.0–100.0)
MONOS PCT: 8.8 %
MPV: 10.7 fL (ref 7.5–12.5)
NEUTROS PCT: 66.9 %
Neutro Abs: 4951 cells/uL (ref 1500–7800)
PLATELETS: 334 10*3/uL (ref 140–400)
RBC: 4.37 10*6/uL (ref 3.80–5.10)
RDW: 13.2 % (ref 11.0–15.0)
TOTAL LYMPHOCYTE: 23.1 %
WBC mixed population: 651 cells/uL (ref 200–950)
WBC: 7.4 10*3/uL (ref 3.8–10.8)

## 2017-06-09 LAB — LIPID PANEL
CHOL/HDL RATIO: 2.4 (calc) (ref <5.0)
CHOLESTEROL: 167 mg/dL (ref <200)
HDL: 70 mg/dL (ref >50)
LDL Cholesterol (Calc): 82 mg/dL (calc)
Non-HDL Cholesterol (Calc): 97 mg/dL (calc) (ref <130)
Triglycerides: 72 mg/dL (ref <150)

## 2017-06-09 LAB — COMPREHENSIVE METABOLIC PANEL
AG Ratio: 1.3 (calc) (ref 1.0–2.5)
ALBUMIN MSPROF: 4.1 g/dL (ref 3.6–5.1)
ALKALINE PHOSPHATASE (APISO): 53 U/L (ref 33–115)
ALT: 39 U/L — ABNORMAL HIGH (ref 6–29)
AST: 43 U/L — ABNORMAL HIGH (ref 10–30)
BILIRUBIN TOTAL: 0.8 mg/dL (ref 0.2–1.2)
BUN: 10 mg/dL (ref 7–25)
CALCIUM: 9.3 mg/dL (ref 8.6–10.2)
CHLORIDE: 102 mmol/L (ref 98–110)
CO2: 25 mmol/L (ref 20–32)
Creat: 1.02 mg/dL (ref 0.50–1.10)
GLOBULIN: 3.2 g/dL (ref 1.9–3.7)
Glucose, Bld: 83 mg/dL (ref 65–99)
POTASSIUM: 4.2 mmol/L (ref 3.5–5.3)
Sodium: 137 mmol/L (ref 135–146)
Total Protein: 7.3 g/dL (ref 6.1–8.1)

## 2017-06-09 LAB — VITAMIN D 25 HYDROXY (VIT D DEFICIENCY, FRACTURES): Vit D, 25-Hydroxy: 28 ng/mL — ABNORMAL LOW (ref 30–100)

## 2017-06-12 LAB — HM MAMMOGRAPHY

## 2017-06-16 DIAGNOSIS — F331 Major depressive disorder, recurrent, moderate: Secondary | ICD-10-CM | POA: Diagnosis not present

## 2017-06-17 ENCOUNTER — Encounter: Payer: Self-pay | Admitting: Internal Medicine

## 2017-06-23 DIAGNOSIS — F331 Major depressive disorder, recurrent, moderate: Secondary | ICD-10-CM | POA: Diagnosis not present

## 2017-06-23 NOTE — Progress Notes (Signed)
Subjective:    Patient ID: [name], female    DOB: [birth year], 41 y.o.   MRN: [id number]  HPI Chief Complaint  Patient presents with  . cpe    cpe, had to reschedule her eye exam. sees obgyn    She is here for a complete physical exam. States she has been doing well except she does report mild nasal congestion.   Hysterectomy 10 years ago for benign reasons. Reports never having a bone density test. Discussed that her insurance company did send a notice that she should have this study done.   Other providers: OB/GYN Dr. Berkley Harvey in [place]. Orthopedist- Dr. Redmond Pulling in [place]. Eyes- Dr. Wynetta Emery , Dr. Basilia Jumbo- dentist in [place]. GI- Dr. 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 Strength training and cardiovascular.   Immunizations: flu shot- declines    Depression screen [facility] 2/9 06/24/2017 06/05/2016 01/17/2016 12/20/2015 11/14/2015  Decreased Interest 0 0 0 0 0  Down, Depressed, Hopeless 0 0 0 0 0  PHQ - 2 Score 0 0 0 0 0    Health maintenance:  Mammogram: 2 weeks ago  Colonoscopy: 5 years ago. Dr. Oneida Alar. States she is due at age 89.  Last Gynecological Exam: August 2018  Last Menstrual cycle: total hysterectomy in 2008 for benign reasons  Last Dental Exam: once annually  Last Eye Exam: has upcoming appointment  Wears seatbelt always, smoke detectors in home and functioning, does not text while driving and feels safe in home environment.   Reviewed allergies, medications, past medical, surgical, family, and social history.    Review of Systems Review of Systems Constitutional: -fever, -chills, -sweats, -unexpected weight change,-fatigue ENT: -runny nose, -ear pain, -sore throat, +nasal congestion Cardiology:  -chest pain, -palpitations, -edema Respiratory: -cough, -shortness of breath, -wheezing Gastroenterology:  -abdominal pain, -nausea, -vomiting, -diarrhea, -constipation  Hematology: -bleeding or bruising problems Musculoskeletal: -arthralgias, -myalgias, -joint swelling, -back pain Ophthalmology: -vision changes Urology: -dysuria, -difficulty urinating, -hematuria, -urinary frequency, -urgency Neurology: -headache, -weakness, -tingling, -numbness       Objective:   Physical Exam BP 120/80   Pulse (!) 58   Ht 5' 11.5" (1.816 m)   Wt 188 lb 6.4 oz (85.5 kg)   BMI 25.91 kg/m   General Appearance:    Alert, cooperative, no distress, appears stated age  Head:    Normocephalic, without obvious abnormality, atraumatic  Eyes:    PERRL, conjunctiva/corneas clear, EOM's intact, fundi    benign  Ears:    Normal TM's and external ear canals  Nose:   Nares with erythema and edema, mucosa normal, no drainage, mild maxillary sinus tenderness  Throat:   Lips, mucosa, and tongue normal; teeth and gums normal  Neck:   Supple, no lymphadenopathy;  thyroid:  no   enlargement/tenderness/nodules; no carotid   bruit or JVD  Back:    Spine nontender, no curvature, ROM normal, no CVA     tenderness  Lungs:     Clear to auscultation bilaterally without wheezes, rales or     ronchi; respirations unlabored  Chest Wall:    No tenderness or deformity   Heart:    Regular rate and rhythm, S1 and S2 normal, no murmur, rub   or gallop  Breast Exam:    Done at OB/GYN  Abdomen:     Soft, non-tender, nondistended, normoactive bowel  sounds,    no masses, no hepatosplenomegaly  Genitalia:    Done at OB/GYN     Extremities:   No clubbing, cyanosis or edema  Pulses:   2+ and symmetric all extremities  Skin:   Skin color, texture, turgor normal, no rashes or lesions  Lymph nodes:   Cervical, supraclavicular, and axillary nodes normal  Neurologic:   CNII-XII intact, normal strength, sensation and gait; reflexes 2+ and symmetric throughout          Psych:   Normal mood, affect, hygiene and grooming.    Urinalysis  dipstick: spec grav 1.030. Negative       Assessment & Plan:  Routine general medical examination at a health care facility  Essential hypertension  Estrogen deficiency - Plan: DG Bone Density  Vitamin D deficiency  Elevated LFTs  History of hysterectomy with bilateral oophorectomy - Plan: DG Bone Density  She is taking good care of herself and her mood is good.  Congratulated her on weight loss and healthy lifestyle.  BP is in goal range. Continue on medication. She will spot check her BP and let me know if her readings are not in goal.  Discussed good bone health. She had a hysterectomy at age 60 for benign reasons. Her first DEXA is ordered.  Recommend calcium in diet, supplemental vitamin D 800 IU daily and weight bearing exercises.  She is aware that her liver enzymes have been chronically elevated. Negative acute hepatitis panel. Questionable fatty liver on CT in 2017. She has lost weight and avoids alcohol. Will monitor this closely.  Discussed safety and health promotion.  Declines flu shot.  Follow up pending DEXA results.

## 2017-06-24 ENCOUNTER — Ambulatory Visit (INDEPENDENT_AMBULATORY_CARE_PROVIDER_SITE_OTHER): Payer: 59 | Admitting: Family Medicine

## 2017-06-24 ENCOUNTER — Encounter: Payer: Self-pay | Admitting: Family Medicine

## 2017-06-24 VITALS — BP 120/80 | HR 58 | Ht 71.5 in | Wt 188.4 lb

## 2017-06-24 DIAGNOSIS — I10 Essential (primary) hypertension: Secondary | ICD-10-CM

## 2017-06-24 DIAGNOSIS — E2839 Other primary ovarian failure: Secondary | ICD-10-CM | POA: Diagnosis not present

## 2017-06-24 DIAGNOSIS — E559 Vitamin D deficiency, unspecified: Secondary | ICD-10-CM | POA: Diagnosis not present

## 2017-06-24 DIAGNOSIS — Z0000 Encounter for general adult medical examination without abnormal findings: Secondary | ICD-10-CM

## 2017-06-24 DIAGNOSIS — Z90710 Acquired absence of both cervix and uterus: Secondary | ICD-10-CM | POA: Diagnosis not present

## 2017-06-24 DIAGNOSIS — R945 Abnormal results of liver function studies: Secondary | ICD-10-CM | POA: Diagnosis not present

## 2017-06-24 DIAGNOSIS — R7989 Other specified abnormal findings of blood chemistry: Secondary | ICD-10-CM

## 2017-06-24 DIAGNOSIS — Z90722 Acquired absence of ovaries, bilateral: Secondary | ICD-10-CM | POA: Diagnosis not present

## 2017-06-24 HISTORY — DX: Acquired absence of both cervix and uterus: Z90.710

## 2017-06-24 LAB — POCT URINALYSIS DIP (PROADVANTAGE DEVICE)
BILIRUBIN UA: NEGATIVE mg/dL
Bilirubin, UA: NEGATIVE
Blood, UA: NEGATIVE
Glucose, UA: NEGATIVE mg/dL
LEUKOCYTES UA: NEGATIVE
Nitrite, UA: NEGATIVE
PH UA: 6 (ref 5.0–8.0)
PROTEIN UA: NEGATIVE mg/dL
SPECIFIC GRAVITY, URINE: 1.03
UUROB: NEGATIVE

## 2017-06-24 NOTE — Addendum Note (Signed)
Addended by: Minette Headland A on: 06/24/2017 10:34 AM   Modules accepted: Orders

## 2017-06-24 NOTE — Patient Instructions (Signed)
For your bone health, make sure you are getting at least 1,200 mg of calcium in your diet and 800 IU of vitamin D daily.  Continue with weight bearing exercises.   Call and [facility]   We will call you with your results.    Preventative Care for Adults - Female      MAINTAIN REGULAR HEALTH EXAMS:  A routine yearly physical is a good way to check in with your primary care provider about your health and preventive screening. It is also an opportunity to share updates about your health and any concerns you have, and receive a thorough all-over exam.   Most health insurance companies pay for at least some preventative services.  Check with your health plan for specific coverages.  WHAT PREVENTATIVE SERVICES DO WOMEN NEED?  Adult women should have their weight and blood pressure checked regularly.   Women age 29 and older should have their cholesterol levels checked regularly.  Women should be screened for cervical cancer with a Pap smear and pelvic exam beginning at either age 79, or 3 years after they become sexually activity.    Breast cancer screening generally begins at age 37 with a mammogram and breast exam by your primary care provider.    Beginning at age 28 and continuing to age 50, women should be screened for colorectal cancer.  Certain people may need continued testing until age 39.  Updating vaccinations is part of preventative care.  Vaccinations help protect against diseases such as the flu.  Osteoporosis is a disease in which the bones lose minerals and strength as we age. Women ages 33 and over should discuss this with their caregivers, as should women after menopause who have other risk factors.  Lab tests are generally done as part of preventative care to screen for anemia and blood disorders, to screen for problems with the kidneys and liver, to screen for bladder problems, to check blood sugar, and to check your cholesterol  level.  Preventative services generally include counseling about diet, exercise, avoiding tobacco, drugs, excessive alcohol consumption, and sexually transmitted infections.    GENERAL RECOMMENDATIONS FOR GOOD HEALTH:  Healthy diet:  Eat a variety of foods, including fruit, vegetables, animal or vegetable protein, such as meat, fish, chicken, and eggs, or beans, lentils, tofu, and grains, such as rice.  Drink plenty of water daily.  Decrease saturated fat in the diet, avoid lots of red meat, processed foods, sweets, fast foods, and fried foods.  Exercise:  Aerobic exercise helps maintain good heart health. At least 30-40 minutes of moderate-intensity exercise is recommended. For example, a brisk walk that increases your heart rate and breathing. This should be done on most days of the week.   Find a type of exercise or a variety of exercises that you enjoy so that it becomes a part of your daily life.  Examples are running, walking, swimming, water aerobics, and biking.  For motivation and support, explore group exercise such as aerobic class, spin class, Zumba, Yoga,or  martial arts, etc.    Set exercise goals for yourself, such as a certain weight goal, walk or run in a race such as a 5k walk/run.  Speak to your primary care provider about exercise goals.  Disease prevention:  If you smoke or chew tobacco, find out from your caregiver how to quit. It can literally save your life, no matter how long you have been a tobacco user. If you  do not use tobacco, never begin.   Maintain a healthy diet and normal weight. Increased weight leads to problems with blood pressure and diabetes.   The Body Mass Index or BMI is a way of measuring how much of your body is fat. Having a BMI above 27 increases the risk of heart disease, diabetes, hypertension, stroke and other problems related to obesity. Your caregiver can help determine your BMI and based on it develop an exercise and dietary program to  help you achieve or maintain this important measurement at a healthful level.  High blood pressure causes heart and blood vessel problems.  Persistent high blood pressure should be treated with medicine if weight loss and exercise do not work.   Fat and cholesterol leaves deposits in your arteries that can block them. This causes heart disease and vessel disease elsewhere in your body.  If your cholesterol is found to be high, or if you have heart disease or certain other medical conditions, then you may need to have your cholesterol monitored frequently and be treated with medication.   Ask if you should have a cardiac stress test if your history suggests this. A stress test is a test done on a treadmill that looks for heart disease. This test can find disease prior to there being a problem.  Menopause can be associated with physical symptoms and risks. Hormone replacement therapy is available to decrease these. You should talk to your caregiver about whether starting or continuing to take hormones is right for you.   Osteoporosis is a disease in which the bones lose minerals and strength as we age. This can result in serious bone fractures. Risk of osteoporosis can be identified using a bone density scan. Women ages 39 and over should discuss this with their caregivers, as should women after menopause who have other risk factors. Ask your caregiver whether you should be taking a calcium supplement and Vitamin D, to reduce the rate of osteoporosis.   Avoid drinking alcohol in excess (more than two drinks per day).  Avoid use of street drugs. Do not share needles with anyone. Ask for professional help if you need assistance or instructions on stopping the use of alcohol, cigarettes, and/or drugs.  Brush your teeth twice a day with fluoride toothpaste, and floss once a day. Good oral hygiene prevents tooth decay and gum disease. The problems can be painful, unattractive, and can cause other health  problems. Visit your dentist for a routine oral and dental check up and preventive care every 6-12 months.   Look at your skin regularly.  Use a mirror to look at your back. Notify your caregivers of changes in moles, especially if there are changes in shapes, colors, a size larger than a pencil eraser, an irregular border, or development of new moles.  Safety:  Use seatbelts 100% of the time, whether driving or as a passenger.  Use safety devices such as hearing protection if you work in environments with loud noise or significant background noise.  Use safety glasses when doing any work that could send debris in to the eyes.  Use a helmet if you ride a bike or motorcycle.  Use appropriate safety gear for contact sports.  Talk to your caregiver about gun safety.  Use sunscreen with a SPF (or skin protection factor) of 15 or greater.  Lighter skinned people are at a greater risk of skin cancer. Don't forget to also wear sunglasses in order to protect your eyes from too  much damaging sunlight. Damaging sunlight can accelerate cataract formation.   Practice safe sex. Use condoms. Condoms are used for birth control and to help reduce the spread of sexually transmitted infections (or STIs).  Some of the STIs are gonorrhea (the clap), chlamydia, syphilis, trichomonas, herpes, HPV (human papilloma virus) and HIV (human immunodeficiency virus) which causes AIDS. The herpes, HIV and HPV are viral illnesses that have no cure. These can result in disability, cancer and death.   Keep carbon monoxide and smoke detectors in your home functioning at all times. Change the batteries every 6 months or use a model that plugs into the wall.   Vaccinations:  Stay up to date with your tetanus shots and other required immunizations. You should have a booster for tetanus every 10 years. Be sure to get your flu shot every year, since 5%-20% of the [place] population comes down with the flu. The flu vaccine changes each year,  so being vaccinated once is not enough. Get your shot in the fall, before the flu season peaks.   Other vaccines to consider:  Human Papilloma Virus or HPV causes cancer of the cervix, and other infections that can be transmitted from person to person. There is a vaccine for HPV, and females should get immunized between the ages of 47 and 47. It requires a series of 3 shots.   Pneumococcal vaccine to protect against certain types of pneumonia.  This is normally recommended for adults age 59 or older.  However, adults younger than 43 years old with certain underlying conditions such as diabetes, heart or lung disease should also receive the vaccine.  Shingles vaccine to protect against Varicella Zoster if you are older than age 44, or younger than 43 years old with certain underlying illness.  Hepatitis A vaccine to protect against a form of infection of the liver by a virus acquired from food.  Hepatitis B vaccine to protect against a form of infection of the liver by a virus acquired from blood or body fluids, particularly if you work in health care.  If you plan to travel internationally, check with your local health department for specific vaccination recommendations.  Cancer Screening:  Breast cancer screening is essential to preventive care for women. All women age 24 and older should perform a breast self-exam every month. At age 25 and older, women should have their caregiver complete a breast exam each year. Women at ages 59 and older should have a mammogram (x-ray film) of the breasts. Your caregiver can discuss how often you need mammograms.    Cervical cancer screening includes taking a Pap smear (sample of cells examined under a microscope) from the cervix (end of the uterus). It also includes testing for HPV (Human Papilloma Virus, which can cause cervical cancer). Screening and a pelvic exam should begin at age 86, or 3 years after a woman becomes sexually active. Screening should  occur every year, with a Pap smear but no HPV testing, up to age 75. After age 32, you should have a Pap smear every 3 years with HPV testing, if no HPV was found previously.   Most routine colon cancer screening begins at the age of 41. On a yearly basis, doctors may provide special easy to use take-home tests to check for hidden blood in the stool. Sigmoidoscopy or colonoscopy can detect the earliest forms of colon cancer and is life saving. These tests use a small camera at the end of a tube to directly examine the  colon. Speak to your caregiver about this at age 55, when routine screening begins (and is repeated every 5 years unless early forms of pre-cancerous polyps or small growths are found).

## 2017-07-08 ENCOUNTER — Telehealth: Payer: Self-pay | Admitting: Family Medicine

## 2017-07-08 NOTE — Telephone Encounter (Signed)
Pt came in and dropped off disability parking placard. Sending back to be completed. Please call pt at [phone] when ready.

## 2017-07-08 NOTE — Telephone Encounter (Signed)
Please call and find out why she is wanting a handicap placard. Has she had one and if so for what problem? I'm not sure she would meet criteria for this so please ask her more questions. Thanks.

## 2017-07-09 NOTE — Telephone Encounter (Signed)
Called pt lmtrc 

## 2017-07-09 NOTE — Telephone Encounter (Signed)
Patient called back and said she has a degenerative bone disorders.  She uses the plackard from time to time when her hip goes out.  She has had the handicap placard for over 16 years.

## 2017-07-09 NOTE — Telephone Encounter (Signed)
Let's have her call her orthopedist since this is a musculoskeletal disorder that they have treated her for.  As far as I can tell she would not meet criteria for me to give this to her.

## 2017-07-14 DIAGNOSIS — F331 Major depressive disorder, recurrent, moderate: Secondary | ICD-10-CM | POA: Diagnosis not present

## 2017-07-14 NOTE — Telephone Encounter (Signed)
Pt came in and stated that she does use this from time to time due to her diagnosis of  Acetabulum intrapelvic protrusion into pelvic region or thigh. She states that her previous pcp always filled this out for her. She is requesting again this be filled out. Sending back again.

## 2017-07-14 NOTE — Telephone Encounter (Signed)
Please have her discuss this with her orthopedist. This is not a diagnosis I am familiar with or treat.  She does not appear to meet criteria from a primary care standpoint. I have to go by the criteria on the application. The orthopedist may be able to provide more insight as to whether she meets criteria based on her history and diagnosis.

## 2017-07-15 NOTE — Telephone Encounter (Signed)
Called pt, advised her that she does not qualify for a handicapp placard, due to their criteria.  I read the criteria off to her.  Also advised she may want to see if her ortho dr would sign off.  She said she will pick up tomorrow.

## 2017-07-15 NOTE — Telephone Encounter (Signed)
Forwarding to [place]

## 2017-07-28 DIAGNOSIS — F331 Major depressive disorder, recurrent, moderate: Secondary | ICD-10-CM | POA: Diagnosis not present

## 2017-08-12 DIAGNOSIS — F331 Major depressive disorder, recurrent, moderate: Secondary | ICD-10-CM | POA: Diagnosis not present

## 2017-08-25 ENCOUNTER — Ambulatory Visit (HOSPITAL_COMMUNITY)
Admission: EM | Admit: 2017-08-25 | Discharge: 2017-08-25 | Disposition: A | Discharge status: Home / Self Care | Payer: Medicare Other | Attending: Family Medicine | Admitting: Family Medicine

## 2017-08-25 ENCOUNTER — Encounter (HOSPITAL_COMMUNITY): Payer: Self-pay | Admitting: Family Medicine

## 2017-08-25 DIAGNOSIS — R05 Cough: Secondary | ICD-10-CM

## 2017-08-25 DIAGNOSIS — J0140 Acute pansinusitis, unspecified: Secondary | ICD-10-CM | POA: Diagnosis not present

## 2017-08-25 MED ORDER — BENZONATATE 100 MG PO CAPS: 100.0000 mg | ORAL_CAPSULE | Freq: Three times a day (TID) | ORAL | 0 refills | Status: DC

## 2017-08-25 MED ORDER — AMOXICILLIN-POT CLAVULANATE 875-125 MG PO TABS: 1.0000 | ORAL_TABLET | Freq: Two times a day (BID) | ORAL | 0 refills | Status: DC

## 2017-08-25 NOTE — ED Triage Notes (Signed)
Pt here for facial pain and sinus congestion since before thanksgiving. sts 1 week ago she started coughing and chest congestion with sore throat.

## 2017-08-25 NOTE — Discharge Instructions (Signed)
Start Augmentin for sinus infection. Tessalon for cough. Continue flonase, nyquil, sinus medicines for nasal congestion. You can use over the counter nasal saline rinse such as neti pot for nasal congestion. Keep hydrated, your urine should be clear to pale yellow in color. Tylenol/motrin for fever and pain. Monitor for any worsening of symptoms, chest pain, shortness of breath, wheezing, swelling of the throat, follow up for reevaluation.   For sore throat try using a honey-based tea. Use 3 teaspoons of honey with juice squeezed from half lemon. Place shaved pieces of ginger into 1/2-1 cup of water and warm over stove top. Then mix the ingredients and repeat every 4 hours as needed.

## 2017-08-25 NOTE — ED Provider Notes (Signed)
[facility]    CSN: [id number] Arrival date & time: 08/25/17  1002     History   Chief Complaint Chief Complaint  Patient presents with  . Facial Pain  . Cough  . Nasal Congestion    HPI [name] is a 42 y.o. female.   42 year old female comes in for continued sinus symptoms after 6 weeks.  Patient states sinus congestion was intermittent in the beginning, and relieved by nasal saline rinses.  However, it gradually got worse, one-week ago she started having mild productive cough with chest congestion and sore throat.  She is also tried Flonase, over-the-counter cold medications with little relief.  Denies fever, chills, night sweats.  Never smoker.      Past Medical History:  Diagnosis Date  . Abdominal pain, epigastric 12/17/2015  . Anxiety    pt denies having dx of anxiety  . BCC (basal cell carcinoma of skin)    right foot   . Bunion, right foot   . Chronic cough   . Closed fibular fracture 10/2008   Left - Sustained 2/2 fall down stairs (same fall as tibular fracture). S/P internal fixation.  . Duodenitis    EGD (07/14/2011) showing chronic duodenitis, H.pyloir neg  . Endometriosis    S/P TAH and salpingo-oophorectomy, right  . Epigastric pain    Chronic, thought 2/2 gastritis, EGD (07/14/2011) showing chronic duodenitis, H.pyloir neg // Repeat EGD (09/2011) - esophagealring, sessible polyp in stomach body, hiatal hernia - rec ad probiotic, await bx  . Esophageal ring    s/p dilatation during EGD (07/2011) - Dr. Oneida Alar  . Gastritis    Thought due to chronic NSAID use 2/2 pain from her congenital hip abnormality  . Headache(784.0)    Migraines  . Hidradenitis suppurativa 05/2006  . 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 With chronic OA of hip  . History of migraine headaches    Typical symptoms - bright lights, unilateral (starting behind her left ear), throbbing .  Marland Kitchen  Hypertension   . Internal hemorrhoids   . Osteoarthritis of hip   . Ovarian cancer ([place]) 1995  . Protrusio acetabuli    diagnosed at age 63  . Sessile colonic polyp    noted 09/2011 -- > rec colonoscopy in 10 years, 2023  . Surgical menopause 08/2007   On HRT. Occuring since 01/09 following TAH and R-salpingo-oophorectomy   . Tibial fracture 10/2008   Left - Sustained 2/2 fall down stairs. Patient is S/P closed internal fixation with Smith Nephew tibial nail locked proximal and distal  . Vitamin D deficiency 11/2009   Vit D level 15 in 11/2009    Patient Active Problem List   Diagnosis Date Noted  . History of hysterectomy with bilateral oophorectomy 06/24/2017  . Elevated LFTs 06/24/2017  . Estrogen deficiency 06/24/2017  . Overweight 11/17/2016  . Abdominal pain 11/04/2016  . Headache 12/17/2015  . Hiatal hernia 02/20/2012  . Acetabulum intrapelvic protrusion into pelvic region or thigh 05/15/2011  . Hypertension 03/20/2010  . MIGRAINE HEADACHE 05/13/2006    Past Surgical History:  Procedure Laterality Date  . ABDOMINAL HYSTERECTOMY    . CESAREAN SECTION  08/2005   Primary low transverse  . COLONOSCOPY  09/22/2011   NWG:NFAOZHY polyp in the rectum/Internal hemorrhoids, benign path  . ESOPHAGOGASTRODUODENOSCOPY  09/22/2011   QMV:HQIO, esophageal/Sessile polyp in the body of the stomach/Hiatal hernia/ABDOMINAL PAIN LIKELY FUNCTIONAL V. POST-INFECTIOUS IBS, path: mild chronic  gastritis  . FRACTURE SURGERY     Right hip  . JOINT REPLACEMENT     right hip  . LAPAROSCOPY  10/2000   Operative laparoscopy with lysis of right adnexal adhesions and uterointestinal adhesions  . left tib fib    . OTHER SURGICAL HISTORY  02/2009   Closed treatment internal fixation, left tibia with Tamala Julian and Nephew tibial nail locked proximal and distal  . TOTAL ABDOMINAL HYSTERECTOMY W/ BILATERAL SALPINGOOPHORECTOMY  08/2007   for endometriosis. With concurrent right salpingo-oophorectomy  (2009),  left oophorectomy (1995)  . TUBAL LIGATION  08/2005   Right tubal ligation    OB History    No data available       Home Medications    Prior to Admission medications   Medication Sig Start Date End Date Taking? Authorizing Provider  amLODipine (NORVASC) 10 MG tablet Take 1 tablet (10 mg total) by mouth daily. 02/26/17   Henson, Vickie L, NP-C  amoxicillin-clavulanate (AUGMENTIN) 875-125 MG tablet Take 1 tablet by mouth every 12 (twelve) hours. 08/25/17   Tasia Catchings, Amy V, PA-C  benzonatate (TESSALON) 100 MG capsule Take 1 capsule (100 mg total) by mouth every 8 (eight) hours. 08/25/17   Ok Edwards, PA-C    Family History Family History  Problem Relation Age of Onset  . Other Father        deceased while in service.   . Breast cancer Sister 80       remission  . Cancer Sister        breast  . Lupus Cousin   . Colon cancer Neg Hx   . Liver disease Neg Hx     Social History Social History   Tobacco Use  . Smoking status: Never Smoker  . Smokeless tobacco: Never Used  Substance Use Topics  . Alcohol use: No    Alcohol/week: 0.0 oz  . Drug use: No     Allergies   Pineapple; Morphine and related; and Tape   Review of Systems Review of Systems  Reason unable to perform ROS: See HPI as above.     Physical Exam Triage Vital Signs ED Triage Vitals [08/25/17 1020]  Enc Vitals Group     BP 136/89     Pulse Rate 71     Resp 18     Temp 97.7 F (36.5 C)     Temp src      SpO2 97 %     Weight      Height      Head Circumference      Peak Flow      Pain Score      Pain Loc      Pain Edu?      Excl. in [place]?    No data found.  Updated Vital Signs BP 136/89   Pulse 71   Temp 97.7 F (36.5 C)   Resp 18   SpO2 97%   Physical Exam  Constitutional: She is oriented to person, place, and time. She appears well-developed and well-nourished. No distress.  HENT:  Head: Normocephalic and atraumatic.  Right Ear: External ear and ear canal normal. Tympanic membrane is  erythematous. Tympanic membrane is not bulging.  Left Ear: External ear and ear canal normal. Tympanic membrane is erythematous. Tympanic membrane is not bulging.  Nose: Right sinus exhibits maxillary sinus tenderness and frontal sinus tenderness. Left sinus exhibits maxillary sinus tenderness and frontal sinus tenderness.  Mouth/Throat: Uvula is midline, oropharynx is clear and  moist and mucous membranes are normal.  Eyes: Conjunctivae are normal. Pupils are equal, round, and reactive to light.  Neck: Normal range of motion. Neck supple.  Cardiovascular: Normal rate, regular rhythm and normal heart sounds. Exam reveals no gallop and no friction rub.  No murmur heard. Pulmonary/Chest: Effort normal and breath sounds normal. She has no decreased breath sounds. She has no wheezes. She has no rhonchi. She has no rales.  Lymphadenopathy:    She has no cervical adenopathy.  Neurological: She is alert and oriented to person, place, and time.  Skin: Skin is warm and dry.  Psychiatric: She has a normal mood and affect. Her behavior is normal. Judgment normal.     UC Treatments / Results  Labs (all labs ordered are listed, but only abnormal results are displayed) Labs Reviewed - No data to display  EKG  EKG Interpretation None       Radiology No results found.  Procedures Procedures (including critical care time)  Medications Ordered in UC Medications - No data to display   Initial Impression / Assessment and Plan / UC Course  I have reviewed the triage vital signs and the nursing notes.  Pertinent labs & imaging results that were available during my care of the patient were reviewed by me and considered in my medical decision making (see chart for details).    Start Augmentin for sinusitis.  Other symptomatic treatment discussed.  Push fluids.  Return precautions given.  Patient expresses understanding and agrees to plan.  Final Clinical Impressions(s) / UC Diagnoses   Final  diagnoses:  Acute non-recurrent pansinusitis    ED Discharge Orders        Ordered    amoxicillin-clavulanate (AUGMENTIN) 875-125 MG tablet  Every 12 hours     08/25/17 1029    benzonatate (TESSALON) 100 MG capsule  Every 8 hours     08/25/17 1029        Ok Edwards, PA-C 08/25/17 1035

## 2017-08-26 DIAGNOSIS — F331 Major depressive disorder, recurrent, moderate: Secondary | ICD-10-CM | POA: Diagnosis not present

## 2017-09-02 ENCOUNTER — Other Ambulatory Visit: Payer: Self-pay | Admitting: Family Medicine

## 2017-09-02 NOTE — Telephone Encounter (Signed)
Called and l/m for pt to call us back to set up an appt , for refill on meds

## 2017-09-04 ENCOUNTER — Telehealth: Payer: Self-pay

## 2017-09-04 NOTE — Telephone Encounter (Signed)
Copied from [place] [id number]. Topic: General - Other >> Sep 04, 2017 12:41 PM Vernona Rieger wrote: Patient is requesting to transfer her care from Dr Josephina Gip to Dr. Karlyn Agee.  Call back is [phone]

## 2017-09-04 NOTE — Telephone Encounter (Signed)
That is fine with me.

## 2017-09-04 NOTE — Telephone Encounter (Signed)
Please schedule

## 2017-09-04 NOTE — Telephone Encounter (Signed)
Pt is scheduled. Thank you.

## 2017-09-09 DIAGNOSIS — F331 Major depressive disorder, recurrent, moderate: Secondary | ICD-10-CM | POA: Diagnosis not present

## 2017-09-10 ENCOUNTER — Ambulatory Visit (INDEPENDENT_AMBULATORY_CARE_PROVIDER_SITE_OTHER): Payer: BLUE CROSS/BLUE SHIELD | Admitting: Internal Medicine

## 2017-09-10 ENCOUNTER — Encounter: Payer: Self-pay | Admitting: Internal Medicine

## 2017-09-10 VITALS — BP 136/80 | HR 74 | Temp 97.6°F | Ht 72.0 in | Wt 190.0 lb

## 2017-09-10 DIAGNOSIS — T300 Burn of unspecified body region, unspecified degree: Secondary | ICD-10-CM | POA: Diagnosis not present

## 2017-09-10 DIAGNOSIS — R748 Abnormal levels of other serum enzymes: Secondary | ICD-10-CM

## 2017-09-10 DIAGNOSIS — R945 Abnormal results of liver function studies: Secondary | ICD-10-CM

## 2017-09-10 DIAGNOSIS — H53143 Visual discomfort, bilateral: Secondary | ICD-10-CM

## 2017-09-10 DIAGNOSIS — J322 Chronic ethmoidal sinusitis: Secondary | ICD-10-CM

## 2017-09-10 DIAGNOSIS — E559 Vitamin D deficiency, unspecified: Secondary | ICD-10-CM

## 2017-09-10 DIAGNOSIS — I10 Essential (primary) hypertension: Secondary | ICD-10-CM

## 2017-09-10 DIAGNOSIS — R1031 Right lower quadrant pain: Secondary | ICD-10-CM

## 2017-09-10 DIAGNOSIS — R7989 Other specified abnormal findings of blood chemistry: Secondary | ICD-10-CM

## 2017-09-10 LAB — HEPATIC FUNCTION PANEL
AG RATIO: 1.2 (calc) (ref 1.0–2.5)
ALBUMIN MSPROF: 4 g/dL (ref 3.6–5.1)
ALT: 22 U/L (ref 6–29)
AST: 23 U/L (ref 10–30)
Alkaline phosphatase (APISO): 55 U/L (ref 33–115)
BILIRUBIN TOTAL: 0.8 mg/dL (ref 0.2–1.2)
Bilirubin, Direct: 0.1 mg/dL (ref 0.0–0.2)
GLOBULIN: 3.4 g/dL (ref 1.9–3.7)
Indirect Bilirubin: 0.7 mg/dL (calc) (ref 0.2–1.2)
TOTAL PROTEIN: 7.4 g/dL (ref 6.1–8.1)

## 2017-09-10 LAB — URINALYSIS, ROUTINE W REFLEX MICROSCOPIC
Bilirubin Urine: NEGATIVE
Hgb urine dipstick: NEGATIVE
Ketones, ur: NEGATIVE
Leukocytes, UA: NEGATIVE
Nitrite: NEGATIVE
PH: 7 (ref 5.0–8.0)
RBC / HPF: NONE SEEN (ref 0–?)
SPECIFIC GRAVITY, URINE: 1.015 (ref 1.000–1.030)
Total Protein, Urine: NEGATIVE
UROBILINOGEN UA: 0.2 (ref 0.0–1.0)
Urine Glucose: NEGATIVE
WBC, UA: NONE SEEN (ref 0–?)

## 2017-09-10 NOTE — Progress Notes (Signed)
Pre visit review using our clinic review tool, if applicable. No additional management support is needed unless otherwise documented below in the visit note. 

## 2017-09-10 NOTE — Patient Instructions (Addendum)
I ordered CT sinus at [facility]  Try over the counter nasal saline followed by over the counter flonase daily and humidifier  Follow up in 1 month  Referred to [facility] in [place] for elevated liver enzymes  Please schedule appt with eye doctor.  Take care    Sinusitis, Adult Sinusitis is soreness and inflammation of your sinuses. Sinuses are hollow spaces in the bones around your face. They are located:  Around your eyes.  In the middle of your forehead.  Behind your nose.  In your cheekbones.  Your sinuses and nasal passages are lined with a stringy fluid (mucus). Mucus normally drains out of your sinuses. When your nasal tissues get inflamed or swollen, the mucus can get trapped or blocked so air cannot flow through your sinuses. This lets bacteria, viruses, and funguses grow, and that leads to infection. Follow these instructions at home: Medicines  Take, use, or apply over-the-counter and prescription medicines only as told by your doctor. These may include nasal sprays.  If you were prescribed an antibiotic medicine, take it as told by your doctor. Do not stop taking the antibiotic even if you start to feel better. Hydrate and Humidify  Drink enough water to keep your pee (urine) clear or pale yellow.  Use a cool mist humidifier to keep the humidity level in your home above 50%.  Breathe in steam for 10-15 minutes, 3-4 times a day or as told by your doctor. You can do this in the bathroom while a hot shower is running.  Try not to spend time in cool or dry air. Rest  Rest as much as possible.  Sleep with your head raised (elevated).  Make sure to get enough sleep each night. General instructions  Put a warm, moist washcloth on your face 3-4 times a day or as told by your doctor. This will help with discomfort.  Wash your hands often with soap and water. If there is no soap and water, use hand sanitizer.  Do not smoke. Avoid being around people who  are smoking (secondhand smoke).  Keep all follow-up visits as told by your doctor. This is important. Contact a doctor if:  You have a fever.  Your symptoms get worse.  Your symptoms do not get better within 10 days. Get help right away if:  You have a very bad headache.  You cannot stop throwing up (vomiting).  You have pain or swelling around your face or eyes.  You have trouble seeing.  You feel confused.  Your neck is stiff.  You have trouble breathing. This information is not intended to replace advice given to you by your health care provider. Make sure you discuss any questions you have with your health care provider. Document Released: 01/14/2008 Document Revised: 03/23/2016 Document Reviewed: 05/23/2015 Elsevier Interactive Patient Education  [employer].

## 2017-09-13 ENCOUNTER — Encounter: Payer: Self-pay | Admitting: Internal Medicine

## 2017-09-13 DIAGNOSIS — R748 Abnormal levels of other serum enzymes: Secondary | ICD-10-CM | POA: Insufficient documentation

## 2017-09-13 DIAGNOSIS — J322 Chronic ethmoidal sinusitis: Secondary | ICD-10-CM | POA: Insufficient documentation

## 2017-09-13 DIAGNOSIS — H53143 Visual discomfort, bilateral: Secondary | ICD-10-CM | POA: Insufficient documentation

## 2017-09-13 NOTE — Progress Notes (Signed)
Chief Complaint  Patient presents with  . Follow-up    transfer from Dr Caryl Bis   Transfer care to Dr. Gayland Curry  1. H/o elevated lfts and right lower abdominal and umbilical ab pain "nagging pain". this was never worked up will pursue w/u today.reviewed prior imaging and there was c/w ? Fatty liver    2. She needs handicap sticker filled out today for since age 42 y.o hip deformities b/l hips causing chronic hip pain  3. She has right forearm burn new no signs of infection  4. She c/o +photophobia chronic and she was supposed to get special contacts from eye MD but never did she is due to go back to [facility] in [place].  5. She also c/o ethmoid and frontal sinus tenderness. She has not tried humidifier nose feels dry. Denies fever/chills.  Reviewed previous MRI brain see A/P with abnormalities in sinus cavities. She has been through 2 rounds of antibiotics and sinus issues since 06/2017. Sinuses feel tight and pressure. Has used flonase in the past w/o much relief. Denies fever/chills,  6 .HTN controlled on Norvasc 10 mg qd    Review of Systems  Constitutional: Negative for chills, fever and weight loss.       Wt gain used to weigh 176 #s   HENT: Positive for sinus pain.   Eyes: Positive for photophobia.  Respiratory: Negative for shortness of breath.   Cardiovascular: Negative for chest pain.  Gastrointestinal: Positive for abdominal pain.  Musculoskeletal: Positive for joint pain.  Skin: Negative for rash.  Neurological: Negative for dizziness.  Psychiatric/Behavioral: Negative for memory loss.   Past Medical History:  Diagnosis Date  . Abdominal pain, epigastric 12/17/2015  . Anxiety    pt denies having dx of anxiety  . BCC (basal cell carcinoma of skin)    right foot   . Bunion, right foot   . Chronic cough   . Closed fibular fracture 10/2008   Left - Sustained 2/2 fall down stairs (same fall as tibular fracture). S/P internal fixation.  . Duodenitis    EGD (07/14/2011)  showing chronic duodenitis, H.pyloir neg  . Elevated liver enzymes   . Endometriosis    S/P TAH and salpingo-oophorectomy, right  . Epigastric pain    Chronic, thought 2/2 gastritis, EGD (07/14/2011) showing chronic duodenitis, H.pyloir neg // Repeat EGD (09/2011) - esophagealring, sessible polyp in stomach body, hiatal hernia - rec ad probiotic, await bx  . Esophageal ring    s/p dilatation during EGD (07/2011) - Dr. Oneida Alar  . Gastritis    Thought due to chronic NSAID use 2/2 pain from her congenital hip abnormality  . Headache(784.0)    Migraines  . Hidradenitis suppurativa 05/2006  . 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 With chronic OA of hip  . History of migraine headaches    Typical symptoms - bright lights, unilateral (starting behind her left ear), throbbing .  Marland Kitchen Hypertension   . Internal hemorrhoids   . Osteoarthritis of hip   . Ovarian cancer ([place]) 1995  . Protrusio acetabuli    diagnosed at age 35  . Sessile colonic polyp    noted 09/2011 -- > rec colonoscopy in 10 years, 2023  . Surgical menopause 08/2007   On HRT. Occuring since 01/09 following TAH and R-salpingo-oophorectomy   . Tibial fracture 10/2008   Left - Sustained 2/2 fall down stairs. Patient is S/P closed internal fixation with Ruthy Dick  tibial nail locked proximal and distal  . Vitamin D deficiency 11/2009   Vit D level 15 in 11/2009   Past Surgical History:  Procedure Laterality Date  . ABDOMINAL HYSTERECTOMY    . CESAREAN SECTION  08/2005   Primary low transverse  . COLONOSCOPY  09/22/2011   OIZ:TIWPYKD polyp in the rectum/Internal hemorrhoids, benign path  . ESOPHAGOGASTRODUODENOSCOPY  09/22/2011   XIP:JASN, esophageal/Sessile polyp in the body of the stomach/Hiatal hernia/ABDOMINAL PAIN LIKELY FUNCTIONAL V. POST-INFECTIOUS IBS, path: mild chronic gastritis  . FRACTURE SURGERY     Right hip  . JOINT REPLACEMENT     right hip   . LAPAROSCOPY  10/2000   Operative laparoscopy with lysis of right adnexal adhesions and uterointestinal adhesions  . left tib fib    . OTHER SURGICAL HISTORY  02/2009   Closed treatment internal fixation, left tibia with Tamala Julian and Nephew tibial nail locked proximal and distal  . TOTAL ABDOMINAL HYSTERECTOMY W/ BILATERAL SALPINGOOPHORECTOMY  08/2007   for endometriosis. With concurrent right salpingo-oophorectomy  (2009), left oophorectomy (1995)  . TUBAL LIGATION  08/2005   Right tubal ligation   Family History  Problem Relation Age of Onset  . Other Father        deceased while in service.   . Breast cancer Sister 6       remission  . Cancer Sister        breast  . Lupus Cousin   . Colon cancer Neg Hx   . Liver disease Neg Hx    Social History   Socioeconomic History  . Marital status: Married    Spouse name: Not on file  . Number of children: 2  . Years of education: bs degree  . Highest education level: Not on file  Social Needs  . Financial resource strain: Not on file  . Food insecurity - worry: Not on file  . Food insecurity - inability: Not on file  . Transportation needs - medical: Not on file  . Transportation needs - non-medical: Not on file  Occupational History  . 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 Smoking status: Never Smoker  . Smokeless tobacco: Never Used  Substance and Sexual Activity  . Alcohol use: No    Alcohol/week: 0.0 oz  . Drug use: No  . Sexual activity: Not on file  Other Topics Concern  . Not on file  Social History Narrative   Insurance: Medicare.   Patient is married with two children, [place] and Herman.    She lives in [place] but children go to school in [place]  . amLODipine (NORVASC) 10 MG tablet TAKE 1 TABLET BY MOUTH EVERY DAY  . estradiol (ESTRACE) 1 MG tablet Take 1 mg by mouth daily.  . [DISCONTINUED] amoxicillin-clavulanate (AUGMENTIN)  875-125 MG tablet Take 1 tablet by mouth every 12 (twelve) hours.  . [DISCONTINUED] benzonatate (TESSALON) 100 MG capsule Take 1 capsule (100 mg total) by mouth every 8 (eight) hours.   Allergies  Allergen Reactions  . Pineapple Shortness Of Breath and Swelling    Has Epi pen  . Latex Swelling  . Morphine And Related Itching  . Tape Swelling   Recent Results (from the past 2160 hour(s))  POCT Urinalysis DIP (Proadvantage Device)     Status: Normal   Collection Time: 06/24/17 10:33 AM  Result Value Ref Range   Color, UA yellow yellow  Clarity, UA clear clear   Glucose, UA negative negative mg/dL   Bilirubin, UA negative negative   Ketones, POC UA negative negative mg/dL   Specific Gravity, Urine 1.030    Blood, UA negative negative   pH, UA 6.0 5.0 - 8.0   Protein Ur, POC negative negative mg/dL   Urobilinogen, Ur n    Nitrite, UA Negative Negative   Leukocytes, UA Negative Negative  Hepatitis B surface antibody     Status: None   Collection Time: 09/10/17 10:24 AM  Result Value Ref Range   Hepatitis B-Post 42 > OR = 10 mIU/mL    Comment: . Patient has immunity to hepatitis B virus. . For additional information, please refer to http://education.[facility].com/faq/FAQ105 (This link is being provided for informational/ educational purposes only).   Tissue transglutaminase, IgA     Status: None   Collection Time: 09/10/17 10:24 AM  Result Value Ref Range   (tTG) Ab, IgA 1 U/mL    Comment: .        Value      Interpretation        -----      --------------        <4         No Antibody Detected        > or = 4   Antibody Detected .   Reticulin Antibody, IgA w reflex titer     Status: None   Collection Time: 09/10/17 10:24 AM  Result Value Ref Range   Reticulin IGA Screen Negative Negative  Antinuclear Antib (ANA)     Status: None   Collection Time: 09/10/17 10:24 AM  Result Value Ref Range   Anit Nuclear Antibody(ANA) NEGATIVE NEGATIVE    Comment: ANA IFA  is a first line screen for detecting the presence of up to approximately 150 autoantibodies in various autoimmune diseases. A negative ANA IFA result suggests ANA-associated autoimmune diseases are not present at this time. . Visit Physician FAQs for interpretation of all antibodies in the [place], prevalence, and association with diseases at http://education.[facility].com/ RWE/RXV400 .   Urinalysis, Routine w reflex microscopic     Status: Abnormal   Collection Time: 09/10/17 10:24 AM  Result Value Ref Range   Color, Urine YELLOW Yellow;Lt. Yellow   APPearance CLEAR Clear   Specific Gravity, Urine 1.015 1.000 - 1.030   pH 7.0 5.0 - 8.0   Total Protein, Urine NEGATIVE Negative   Urine Glucose NEGATIVE Negative   Ketones, ur NEGATIVE Negative   Bilirubin Urine NEGATIVE Negative   Hgb urine dipstick NEGATIVE Negative   Urobilinogen, UA 0.2 0.0 - 1.0   Leukocytes, UA NEGATIVE Negative   Nitrite NEGATIVE Negative   WBC, UA none seen 0-2/hpf   RBC / HPF none seen 0-2/hpf   Squamous Epithelial / LPF Few(5-10/hpf) (A) Rare(0-4/hpf)  Hepatic function panel     Status: None   Collection Time: 09/10/17 10:34 AM  Result Value Ref Range   Total Protein 7.4 6.1 - 8.1 g/dL   Albumin 4.0 3.6 - 5.1 g/dL   Globulin 3.4 1.9 - 3.7 g/dL (calc)   AG Ratio 1.2 1.0 - 2.5 (calc)   Total Bilirubin 0.8 0.2 - 1.2 mg/dL   Bilirubin, Direct 0.1 0.0 - 0.2 mg/dL   Indirect Bilirubin 0.7 0.2 - 1.2 mg/dL (calc)   Alkaline phosphatase (APISO) 55 33 - 115 U/L   AST 23 10 - 30 U/L   ALT 22 6 - 29 U/L   Objective  Body mass index is 25.77 kg/m. Wt Readings from Last 3 Encounters:  09/10/17 190 lb (86.2 kg)  06/24/17 188 lb 6.4 oz (85.5 kg)  12/22/16 195 lb 6.4 oz (88.6 kg)   Temp Readings from Last 3 Encounters:  09/10/17 97.6 F (36.4 C) (Oral)  08/25/17 97.7 F (36.5 C)  06/03/17 98.6 F (37 C) (Oral)   BP Readings from Last 3 Encounters:  09/10/17 136/80  08/25/17 136/89  06/24/17  120/80   Pulse Readings from Last 3 Encounters:  09/10/17 74  08/25/17 71  06/24/17 (!) 58   O2 sat room air 94%  Physical Exam  Constitutional: She is oriented to person, place, and time and well-developed, well-nourished, and in no distress. Vital signs are normal.  HENT:  Head: Normocephalic and atraumatic.  Nose: Right sinus exhibits frontal sinus tenderness. Left sinus exhibits frontal sinus tenderness.  Mouth/Throat: Oropharynx is clear and moist and mucous membranes are normal.  +ethmoid ttp mild   Eyes: Conjunctivae are normal. Pupils are equal, round, and reactive to light.  Cardiovascular: Normal rate, regular rhythm and normal heart sounds.  Pulmonary/Chest: Effort normal and breath sounds normal.  Abdominal: Soft. Bowel sounds are normal. There is tenderness in the periumbilical area. There is positive Murphy's sign. There is no rebound and no guarding.  Neurological: She is alert and oriented to person, place, and time. Gait normal. Gait normal.  Skin: Skin is warm, dry and intact.  Psychiatric: Mood, memory, affect and judgment normal.  Nursing note and vitals reviewed.   Assessment   1.abdominal pain and Abnormal lfts reviewed 01/30/16 CT scan with possible fatty liver. 11/11/16 nl GB ejection fraction. [place] ab 11/05/16 prominent size CBD 7 mm and c/w fatty liver.  2. Sinus pressure ? Chronic Sinusitis (frontal and ethmoid) MRI 12/25/15 with symmetric adenoid enlargement with rentention cysts and diffuse mucosal thickening in the left mastoid air cells, mild mucosal thickening in right frontal sinus noted.  3. Chronic hip pain with b/l protusio acetabuli moderate to severe changes hips  4. Photophobia ? Etiology  5. Right forearm burn 6. HM 7. HTN controlled   Plan   1.  Consider US abdomen  Repeat lfts; W/u for autoimmune etiologies ANA, antismooth muscle, anti liver kidney antibodies. Check hep Bsab to see if immune  Refer to [facility] for consult in [place]  2. Do CT sinuses  to further w/u  Trial of NS, then flonase, and humidifier  She may need referral to ENT in future  3. Handicap form filled out today  4. Pt needs to f/u with [facility] encouraged to make appt  5. Wound care otc antibacterial oints to heal  6.   Not had flu shot this year pna 23 had 02/19/09, Tdap had 06/05/16  Hep B immune, HCV neg 06/05/16  HIV neg 06/05/16  Consider hep A vaccine if +fatty liver given review of prior images   Never smoker  Check UA today for blood/protein Pap (s/p hysterectomy 08/2007 cervix, uterus, right ovary and fallopian tube removed with h/o endometriosis and abnormal pap) [place]N in [place] Dr. Ok Edwards appt pending 02/2018 h/o abnormal pap  Mammogram had [facility] 06/2017 need to get records Colonoscopy had 09/22/11 sessile polyp in rectum  Neg malignancy and fundic gland polyp and mild chronic gastritis neg H pylori.   Of note vit D def taking 2000 IU qd vit  7. Cont norvasc 10 mg qd   Provider: Dr. Olivia Mackie McLean-[facility]ocuz[facility]

## 2017-09-15 LAB — ANTI-SMOOTH MUSCLE ANTIBODY, IGG: Actin (Smooth Muscle) Antibody (IGG): <20 U (ref <20)

## 2017-09-15 LAB — HEPATITIS B SURFACE ANTIBODY, QUANTITATIVE: Hepatitis B-Post: 42 m[IU]/mL (ref >=10)

## 2017-09-15 LAB — ANA: Anti Nuclear Antibody(ANA): NEGATIVE

## 2017-09-15 LAB — GLIADIN ANTIBODIES, SERUM
GLIADIN IGA: 13 U
GLIADIN IGG: 4 U

## 2017-09-15 LAB — TISSUE TRANSGLUTAMINASE, IGA: (tTG) Ab, IgA: 1 U/mL

## 2017-09-15 LAB — RETICULIN ANTIBODIES, IGA W TITER: RETICULIN IGA SCREEN: NEGATIVE

## 2017-09-15 LAB — ANTI-MICROSOMAL ANTIBODY LIVER / KIDNEY

## 2017-09-16 ENCOUNTER — Other Ambulatory Visit: Payer: Self-pay | Admitting: Internal Medicine

## 2017-09-16 DIAGNOSIS — R1084 Generalized abdominal pain: Secondary | ICD-10-CM

## 2017-09-16 DIAGNOSIS — R748 Abnormal levels of other serum enzymes: Secondary | ICD-10-CM

## 2017-09-18 ENCOUNTER — Ambulatory Visit
Admission: RE | Admit: 2017-09-18 | Discharge: 2017-09-18 | Disposition: A | Discharge status: Home / Self Care | Payer: BLUE CROSS/BLUE SHIELD | Source: Ambulatory Visit | Attending: Internal Medicine | Admitting: Internal Medicine

## 2017-09-18 DIAGNOSIS — J322 Chronic ethmoidal sinusitis: Secondary | ICD-10-CM | POA: Diagnosis not present

## 2017-09-18 DIAGNOSIS — J0110 Acute frontal sinusitis, unspecified: Secondary | ICD-10-CM | POA: Diagnosis not present

## 2017-09-18 DIAGNOSIS — J329 Chronic sinusitis, unspecified: Secondary | ICD-10-CM | POA: Diagnosis not present

## 2017-09-22 ENCOUNTER — Telehealth: Payer: Self-pay | Admitting: *Deleted

## 2017-09-22 ENCOUNTER — Ambulatory Visit: Payer: BLUE CROSS/BLUE SHIELD

## 2017-09-22 DIAGNOSIS — F331 Major depressive disorder, recurrent, moderate: Secondary | ICD-10-CM | POA: Diagnosis not present

## 2017-09-22 NOTE — Telephone Encounter (Signed)
Copied from [place] [id number]. Topic: General - Other >> Sep 22, 2017 10:21 AM Carolyn Stare wrote:  [name] with [facility] call to say pt did not show up for her [place] .

## 2017-09-22 NOTE — Telephone Encounter (Signed)
Please call pt and inform she needs to reschedule abdomen US   Thanks Burton

## 2017-09-23 NOTE — Telephone Encounter (Signed)
Please sign and close encounter when completed,

## 2017-09-23 NOTE — Telephone Encounter (Signed)
Left message for patient to return call back. [facility] may inform patient

## 2017-09-28 NOTE — Telephone Encounter (Signed)
Please close encounter when completed.

## 2017-09-28 NOTE — Telephone Encounter (Signed)
Patient stated she will call to reschedule [place] she def wants [place].

## 2017-09-29 ENCOUNTER — Telehealth: Payer: Self-pay | Admitting: Internal Medicine

## 2017-09-29 NOTE — Telephone Encounter (Unsigned)
Copied from [place]. Topic: General - Other >> Sep 29, 2017  2:59 PM Neva Seat wrote: Pt is wanting the results from the recent sinus scan.  Please call pt asap for results.  Pt is not wanting the abdominal scan done - wants to cancel this at this time.

## 2017-09-29 NOTE — Telephone Encounter (Signed)
Left message for patient to return call back for lab results. [facility] may give results and obtain information.  Please see result note.

## 2017-09-29 NOTE — Telephone Encounter (Signed)
Please advise 

## 2017-10-06 DIAGNOSIS — F331 Major depressive disorder, recurrent, moderate: Secondary | ICD-10-CM | POA: Diagnosis not present

## 2017-10-07 ENCOUNTER — Ambulatory Visit (INDEPENDENT_AMBULATORY_CARE_PROVIDER_SITE_OTHER): Payer: BLUE CROSS/BLUE SHIELD | Admitting: Internal Medicine

## 2017-10-07 ENCOUNTER — Encounter: Payer: Self-pay | Admitting: Internal Medicine

## 2017-10-07 VITALS — BP 130/78 | HR 56 | Temp 98.1°F | Ht 72.0 in | Wt 190.2 lb

## 2017-10-07 DIAGNOSIS — L732 Hidradenitis suppurativa: Secondary | ICD-10-CM | POA: Diagnosis not present

## 2017-10-07 DIAGNOSIS — J321 Chronic frontal sinusitis: Secondary | ICD-10-CM | POA: Diagnosis not present

## 2017-10-07 DIAGNOSIS — K760 Fatty (change of) liver, not elsewhere classified: Secondary | ICD-10-CM | POA: Diagnosis not present

## 2017-10-07 DIAGNOSIS — R748 Abnormal levels of other serum enzymes: Secondary | ICD-10-CM

## 2017-10-07 MED ORDER — DOXYCYCLINE HYCLATE 100 MG PO TABS: 100.0000 mg | ORAL_TABLET | Freq: Two times a day (BID) | ORAL | 0 refills | Status: DC

## 2017-10-07 MED ORDER — CLINDAMYCIN PHOSPHATE 1 % EX GEL: Freq: Two times a day (BID) | CUTANEOUS | 3 refills | Status: DC

## 2017-10-07 MED ORDER — FLUCONAZOLE 150 MG PO TABS: 150.0000 mg | ORAL_TABLET | Freq: Once | ORAL | 0 refills | Status: AC

## 2017-10-07 NOTE — Progress Notes (Signed)
Pre visit review using our clinic review tool, if applicable. No additional management support is needed unless otherwise documented below in the visit note. 

## 2017-10-07 NOTE — Patient Instructions (Addendum)
Consider hepatitis A vaccine in the future  F/u in 3 months sooner if needed   Fatty Liver Fatty liver, also called hepatic steatosis or steatohepatitis, is a condition in which too much fat has built up in your liver cells. The liver removes harmful substances from your bloodstream. It produces fluids your body needs. It also helps your body use and store energy from the food you eat. In many cases, fatty liver does not cause symptoms or problems. It is often diagnosed when tests are being done for other reasons. However, over time, fatty liver can cause inflammation that may lead to more serious liver problems, such as scarring of the liver (cirrhosis). What are the causes? Causes of fatty liver may include:  Drinking too much alcohol.  Poor nutrition.  Obesity.  Cushing syndrome.  Diabetes.  Hyperlipidemia.  Pregnancy.  Certain drugs.  Poisons.  Some viral infections.  What increases the risk? You may be more likely to develop fatty liver if you:  Abuse alcohol.  Are pregnant.  Are overweight.  Have diabetes.  Have hepatitis.  Have a high triglyceride level.  What are the signs or symptoms? Fatty liver often does not cause any symptoms. In cases where symptoms develop, they can include:  Fatigue.  Weakness.  Weight loss.  Confusion.  Abdominal pain.  Yellowing of your skin and the white parts of your eyes (jaundice).  Nausea and vomiting.  How is this diagnosed? Fatty liver may be diagnosed by:  Physical exam and medical history.  Blood tests.  Imaging tests, such as an ultrasound, CT scan, or MRI.  Liver biopsy. A small sample of liver tissue is removed using a needle. The sample is then looked at under a microscope.  How is this treated? Fatty liver is often caused by other health conditions. Treatment for fatty liver may involve medicines and lifestyle changes to manage conditions such as:  Alcoholism.  High  cholesterol.  Diabetes.  Being overweight or obese.  Follow these instructions at home:  Eat a healthy diet as directed by your health care provider.  Exercise regularly. This can help you lose weight and control your cholesterol and diabetes. Talk to your health care provider about an exercise plan and which activities are best for you.  Do not drink alcohol.  Take medicines only as directed by your health care provider. Contact a health care provider if: You have difficulty controlling your:  Blood sugar.  Cholesterol.  Alcohol consumption.  Get help right away if:  You have abdominal pain.  You have jaundice.  You have nausea and vomiting. This information is not intended to replace advice given to you by your health care provider. Make sure you discuss any questions you have with your health care provider. Document Released: 09/12/2005 Document Revised: 01/03/2016 Document Reviewed: 12/07/2013 Elsevier Interactive Patient Education  2018 [employer].  Hidradenitis Suppurativa Hidradenitis suppurativa is a long-term (chronic) skin disease that starts with blocked sweat glands or hair follicles. Bacteria may grow in these blocked openings of your skin. Hidradenitis suppurativa is like a severe form of acne that develops in areas of your body where acne would be unusual. It is most likely to affect the areas of your body where skin rubs against skin and becomes moist. This includes your:  Underarms.  Groin.  Genital areas.  Buttocks.  Upper thighs.  Breasts.  Hidradenitis suppurativa may start out with small pimples. The pimples can develop into deep sores that break open (rupture) and drain  pus. Over time your skin may thicken and become scarred. Hidradenitis suppurativa cannot be passed from person to person. What are the causes? The exact cause of hidradenitis suppurativa is not known. This condition may be due to:  Female and female hormones. The condition  is rare before and after puberty.  An overactive body defense system (immune system). Your immune system may overreact to the blocked hair follicles or sweat glands and cause swelling and pus-filled sores.  What increases the risk? You may have a higher risk of hidradenitis suppurativa if you:  Are a woman.  Are between ages 57 and 49.  Have a family history of hidradenitis suppurativa.  Have a personal history of acne.  Are overweight.  Smoke.  Take the drug lithium.  What are the signs or symptoms? The first signs of an outbreak are usually painful skin bumps that look like pimples. As the condition progresses:  Skin bumps may get bigger and grow deeper into the skin.  Bumps under the skin may rupture and drain smelly pus.  Skin may become itchy and infected.  Skin may thicken and scar.  Drainage may continue through tunnels under the skin (fistulas).  Walking and moving your arms can become painful.  How is this diagnosed? Your health care provider may diagnose hidradenitis suppurativa based on your medical history and your signs and symptoms. A physical exam will also be done. You may need to see a health care provider who specializes in skin diseases (dermatologist). You may also have tests done to confirm the diagnosis. These can include:  Swabbing a sample of pus or drainage from your skin so it can be sent to the lab and tested for infection.  Blood tests to check for infection.  How is this treated? The same treatment will not work for everybody with hidradenitis suppurativa. Your treatment will depend on how severe your symptoms are. You may need to try several treatments to find what works best for you. Part of your treatment may include cleaning and bandaging (dressing) your wounds. You may also have to take medicines, such as the following:  Antibiotics.  Acne medicines.  Medicines to block or suppress the immune system.  A diabetes medicine  (metformin) is sometimes used to treat this condition.  For women, birth control pills can sometimes help relieve symptoms.  You may need surgery if you have a severe case of hidradenitis suppurativa that does not respond to medicine. Surgery may involve:  Using a laser to clear the skin and remove hair follicles.  Opening and draining deep sores.  Removing the areas of skin that are diseased and scarred.  Follow these instructions at home:  Learn as much as you can about your disease, and work closely with your health care providers.  Take medicines only as directed by your health care provider.  If you were prescribed an antibiotic medicine, finish it all even if you start to feel better.  If you are overweight, losing weight may be very helpful. Try to reach and maintain a healthy weight.  Do not use any tobacco products, including cigarettes, chewing tobacco, or electronic cigarettes. If you need help quitting, ask your health care provider.  Do not shave the areas where you get hidradenitis suppurativa.  Do not wear deodorant.  Wear loose-fitting clothes.  Try not to overheat and get sweaty.  Take a daily bleach bath as directed by your health care provider. ? Fill your bathtub halfway with water. ? Pour in  cup of unscented household bleach. ? Soak for 5-10 minutes.  Cover sore areas with a warm, clean washcloth (compress) for 5-10 minutes. Contact a health care provider if:  You have a flare-up of hidradenitis suppurativa.  You have chills or a fever.  You are having trouble controlling your symptoms at home. This information is not intended to replace advice given to you by your health care provider. Make sure you discuss any questions you have with your health care provider. Document Released: 03/11/2004 Document Revised: 01/03/2016 Document Reviewed: 10/28/2013 Elsevier Interactive Patient Education  2018 [employer].

## 2017-10-11 ENCOUNTER — Encounter: Payer: Self-pay | Admitting: Internal Medicine

## 2017-10-11 DIAGNOSIS — K760 Fatty (change of) liver, not elsewhere classified: Secondary | ICD-10-CM | POA: Insufficient documentation

## 2017-10-11 DIAGNOSIS — J321 Chronic frontal sinusitis: Secondary | ICD-10-CM | POA: Insufficient documentation

## 2017-10-11 DIAGNOSIS — L732 Hidradenitis suppurativa: Secondary | ICD-10-CM | POA: Insufficient documentation

## 2017-10-11 NOTE — Progress Notes (Signed)
Chief Complaint  Patient presents with  . Follow-up   Follow up  1. She c/o boil to right axilla she gets these chronically underarms and private area. She does shave under arms with disposable razor. Nothing tried 2. She still c/o sinus h/a though tried NS, flonase, humidifier.  reviewed CT sinus with mild opacifications in inferior right frontal sinus.This has been ongoing since 06/2017 3. H/o abnormal lfts w/u negative autoimmune w/u  Neg and lfts normalized reviewed CT ab/pelvis 2017 with ? Fatty liver pt did not get US abdomen as referred last visit she reports she had some study at [facility] in [place] but does not know what it was.     Review of Systems  Constitutional: Negative for weight loss.  HENT: Positive for sinus pain.   Eyes: Positive for photophobia.  Respiratory: Negative for shortness of breath.   Cardiovascular: Negative for chest pain.  Gastrointestinal: Negative for abdominal pain.  Musculoskeletal: Positive for joint pain. Negative for falls.  Skin:       +boil   Neurological: Positive for headaches.  Psychiatric/Behavioral: Negative for depression.   Past Medical History:  Diagnosis Date  . Abdominal pain, epigastric 12/17/2015  . Anxiety    pt denies having dx of anxiety  . BCC (basal cell carcinoma of skin)    right foot   . Bunion, right foot   . Chronic cough   . Closed fibular fracture 10/2008   Left - Sustained 2/2 fall down stairs (same fall as tibular fracture). S/P internal fixation.  . Duodenitis    EGD (07/14/2011) showing chronic duodenitis, H.pyloir neg  . Elevated liver enzymes   . Endometriosis    S/P TAH and salpingo-oophorectomy, right  . Epigastric pain    Chronic, thought 2/2 gastritis, EGD (07/14/2011) showing chronic duodenitis, H.pyloir neg // Repeat EGD (09/2011) - esophagealring, sessible polyp in stomach body, hiatal hernia - rec ad probiotic, await bx  . Esophageal ring    s/p dilatation during EGD (07/2011) - Dr. Oneida Alar    . Gastritis    Thought due to chronic NSAID use 2/2 pain from her congenital hip abnormality  . Headache(784.0)    Migraines  . Hidradenitis suppurativa 05/2006  . 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 With chronic OA of hip  . History of migraine headaches    Typical symptoms - bright lights, unilateral (starting behind her left ear), throbbing .  Marland Kitchen Hypertension   . Internal hemorrhoids   . Osteoarthritis of hip   . Ovarian cancer ([place]) 1995  . Protrusio acetabuli    diagnosed at age 6  . Sessile colonic polyp    noted 09/2011 -- > rec colonoscopy in 10 years, 2023  . Surgical menopause 08/2007   On HRT. Occuring since 01/09 following TAH and R-salpingo-oophorectomy   . Tibial fracture 10/2008   Left - Sustained 2/2 fall down stairs. Patient is S/P closed internal fixation with Smith Nephew tibial nail locked proximal and distal  . Vitamin D deficiency 11/2009   Vit D level 15 in 11/2009   Past Surgical History:  Procedure Laterality Date  . ABDOMINAL HYSTERECTOMY    . CESAREAN SECTION  08/2005   Primary low transverse  . COLONOSCOPY  09/22/2011   YTK:ZSWFUXN polyp in the rectum/Internal hemorrhoids, benign path  . ESOPHAGOGASTRODUODENOSCOPY  09/22/2011   ATF:TDDU, esophageal/Sessile polyp in the body of the stomach/Hiatal hernia/ABDOMINAL PAIN LIKELY FUNCTIONAL V. POST-INFECTIOUS IBS, path: mild  chronic gastritis  . FRACTURE SURGERY     Right hip  . JOINT REPLACEMENT     right hip  . LAPAROSCOPY  10/2000   Operative laparoscopy with lysis of right adnexal adhesions and uterointestinal adhesions  . left tib fib    . OTHER SURGICAL HISTORY  02/2009   Closed treatment internal fixation, left tibia with Tamala Julian and Nephew tibial nail locked proximal and distal  . TOTAL ABDOMINAL HYSTERECTOMY W/ BILATERAL SALPINGOOPHORECTOMY  08/2007   for endometriosis. With concurrent right salpingo-oophorectomy  (2009), left  oophorectomy (1995)  . TUBAL LIGATION  08/2005   Right tubal ligation   Family History  Problem Relation Age of Onset  . Other Father        deceased while in service.   . Breast cancer Sister 54       remission  . Cancer Sister        breast  . Lupus Cousin   . Colon cancer Neg Hx   . Liver disease Neg Hx    Social History   Socioeconomic History  . Marital status: Married    Spouse name: Not on file  . Number of children: 2  . Years of education: bs degree  . Highest education level: Not on file  Social Needs  . Financial resource strain: Not on file  . Food insecurity - worry: Not on file  . Food insecurity - inability: Not on file  . Transportation needs - medical: Not on file  . Transportation needs - non-medical: Not on file  Occupational History  . 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 Smoking status: Never Smoker  . Smokeless tobacco: Never Used  Substance and Sexual Activity  . Alcohol use: No    Alcohol/week: 0.0 oz  . Drug use: No  . Sexual activity: Not on file  Other Topics Concern  . Not on file  Social History Narrative   Insurance: Medicare.   Patient is married with two children, [place] and [place].    She lives in [place] but children go to school in [place]       No outpatient medications have been marked as taking for the 10/07/17 encounter (Office Visit) with McLean-Scocuzza, Nino Glow, MD.   Allergies  Allergen Reactions  . Pineapple Shortness Of Breath and Swelling    Has Epi pen  . Latex Swelling  . Morphine And Related Itching  . Tape Swelling   Recent Results (from the past 2160 hour(s))  Hepatitis B surface antibody     Status: None   Collection Time: 09/10/17 10:24 AM  Result Value Ref Range   Hepatitis B-Post 42 > OR = 10 mIU/mL    Comment: . Patient has immunity to hepatitis B virus. . For additional information, please refer  to http://education.[facility].com/faq/FAQ105 (This link is being provided for informational/ educational purposes only).   Gliadin antibodies, serum     Status: None   Collection Time: 09/10/17 10:24 AM  Result Value Ref Range   Gliadin IgA 13 Units    Comment: .           Value  Interpretation           -----  --------------           <20    Antibody not detected           >or=20 Antibody detected .    Gliadin IgG 4 Units    Comment: .  Value  Interpretation           -----  --------------           <20    Antibody not detected           >or=20 Antibody detected .   Tissue transglutaminase, IgA     Status: None   Collection Time: 09/10/17 10:24 AM  Result Value Ref Range   (tTG) Ab, IgA 1 U/mL    Comment: .        Value      Interpretation        -----      --------------        <4         No Antibody Detected        > or = 4   Antibody Detected .   Reticulin Antibody, IgA w reflex titer     Status: None   Collection Time: 09/10/17 10:24 AM  Result Value Ref Range   Reticulin IGA Screen Negative Negative  Antinuclear Antib (ANA)     Status: None   Collection Time: 09/10/17 10:24 AM  Result Value Ref Range   Anit Nuclear Antibody(ANA) NEGATIVE NEGATIVE    Comment: ANA IFA is a first line screen for detecting the presence of up to approximately 150 autoantibodies in various autoimmune diseases. A negative ANA IFA result suggests ANA-associated autoimmune diseases are not present at this time. . Visit Physician FAQs for interpretation of all antibodies in the [place], prevalence, and association with diseases at http://education.[facility].com/ YKD/XIP382 .   Anti-smooth muscle antibody, IgG     Status: None   Collection Time: 09/10/17 10:24 AM  Result Value Ref Range   Actin (Smooth Muscle) Antibody (IGG) <20 <20 U    Comment: . Reference Range:    <20 U: Negative >or=20 U: Positive . Marland Kitchen Antibodies recognizing actin are the main  component of smooth muscle antibodies associated with auto- immune liver disease. Actin antibodies are found in approximately 75% of patients with autoimmune hepatitis (AIH) type 1, approximately 65% of patients with autoimmune cholangitis, approximately 30% of patients with primary biliary cirrhosis and approximately 2% of healthy controls. High values are closely correlated with AIH type 1. .   AntiMicrosomal Ab-Liver / Kidney     Status: None   Collection Time: 09/10/17 10:24 AM  Result Value Ref Range   LKM1 Ab <=20.0 <=20.0 U    Comment: . Reference Range:   <=20.0      Negative   20.1-24.9   Equivocal   >=25.0      Positive . Anti-liver/kidney microsomal antibodies (Anti-LKM-1) were previously tested by indirect immunofluorescence (IF) using rodent liver/kidney substrate. Identification of a specific antibody target as cytochrome P450 IID6 has led to the current recombinant based ELISA. Antibodies to this cytochrome are present in approximately 70% of patients with autoimmune hepatitis type 2. This antibody is also present in approximately 10% of patients with hepatitis C infection. .   Urinalysis, Routine w reflex microscopic     Status: Abnormal   Collection Time: 09/10/17 10:24 AM  Result Value Ref Range   Color, Urine YELLOW Yellow;Lt. Yellow   APPearance CLEAR Clear   Specific Gravity, Urine 1.015 1.000 - 1.030   pH 7.0 5.0 - 8.0   Total Protein, Urine NEGATIVE Negative   Urine Glucose NEGATIVE Negative   Ketones, ur NEGATIVE Negative   Bilirubin Urine NEGATIVE Negative   Hgb urine dipstick NEGATIVE Negative   Urobilinogen, UA  0.2 0.0 - 1.0   Leukocytes, UA NEGATIVE Negative   Nitrite NEGATIVE Negative   WBC, UA none seen 0-2/hpf   RBC / HPF none seen 0-2/hpf   Squamous Epithelial / LPF Few(5-10/hpf) (A) Rare(0-4/hpf)  Hepatic function panel     Status: None   Collection Time: 09/10/17 10:34 AM  Result Value Ref Range   Total Protein 7.4 6.1 - 8.1 g/dL    Albumin 4.0 3.6 - 5.1 g/dL   Globulin 3.4 1.9 - 3.7 g/dL (calc)   AG Ratio 1.2 1.0 - 2.5 (calc)   Total Bilirubin 0.8 0.2 - 1.2 mg/dL   Bilirubin, Direct 0.1 0.0 - 0.2 mg/dL   Indirect Bilirubin 0.7 0.2 - 1.2 mg/dL (calc)   Alkaline phosphatase (APISO) 55 33 - 115 U/L   AST 23 10 - 30 U/L   ALT 22 6 - 29 U/L   Objective  Body mass index is 25.8 kg/m. Wt Readings from Last 3 Encounters:  10/07/17 190 lb 3.2 oz (86.3 kg)  09/10/17 190 lb (86.2 kg)  06/24/17 188 lb 6.4 oz (85.5 kg)   Temp Readings from Last 3 Encounters:  10/07/17 98.1 F (36.7 C) (Oral)  09/10/17 97.6 F (36.4 C) (Oral)  08/25/17 97.7 F (36.5 C)   BP Readings from Last 3 Encounters:  10/07/17 130/78  09/10/17 136/80  08/25/17 136/89   Pulse Readings from Last 3 Encounters:  10/07/17 (!) 56  09/10/17 74  08/25/17 71   O2 sat room air 98%  Physical Exam  Constitutional: She is oriented to person, place, and time and well-developed, well-nourished, and in no distress.  HENT:  Head: Normocephalic and atraumatic.  Mouth/Throat: Oropharynx is clear and moist and mucous membranes are normal.  Eyes: Conjunctivae are normal. Pupils are equal, round, and reactive to light.  Cardiovascular: Normal rate, regular rhythm and normal heart sounds.  Pulmonary/Chest: Effort normal and breath sounds normal.  Neurological: She is alert and oriented to person, place, and time. Gait normal. Gait normal.  Skin: Skin is warm and dry. Lesion noted.     7.7-8.2 cm folliculitis vs hidradentitis suppurativa right axilla with yellow pus drainage   Psychiatric: Mood, memory, affect and judgment normal.  Nursing note and vitals reviewed.   Assessment   1. Folliculitis vs hidradenitis suppurativa right axilla 0.5-0.6 cm  2. Sinusitis CT sinus with mild opacifications inf. Right frontal sinus  3. Fatty liver suspected with h/o abnormal lfts now normal lfts  4. HM Plan  1.  Cleaned with betadine, used 11 blade to I&D  yellow drainage from lesion  Will rx warm compress and doxycycline and topical clindamycin. Diflucan for yeast infection s/p abx rec shave with electric razor   2. Trial of doxycycline if not better refer to ENT 3. Reviewed fatty liver  Pt did not have US abdomen as ordered last visit  Will try to get whatever she had at [facility] in the past release signed today  rec hep A vaccine to consider in future hep B immune   4. Declines flu shot  Hep B immune, consider hep A vaccine  Tdap up to date   Get records mammo and pap OB[place]GYN [place]. Get copy [facility] in [place]  pna 23 had 02/19/09, Tdap had 06/05/16  Hep B immune, HCV neg 06/05/16  HIV neg 06/05/16 Never smoker  Pap (s/p hysterectomy 08/2007 cervix, uterus, right ovary and fallopian tube removed with h/o endometriosis and abnormal pap) [place]GYN in [place] Dr. Ok Edwards  appt pending 02/2018 h/o abnormal pap  Mammogram had [facility] 06/2017 need to get records Colonoscopy had 09/22/11 sessile polyp in rectum  Neg malignancy and fundic gland polyp and mild chronic gastritis neg H pylori.   rec pt make [facility] appt she will do   Provider: Dr. Olivia Mackie McLean-Scocuzza-Internal Medicine

## 2017-10-14 ENCOUNTER — Ambulatory Visit: Payer: BLUE CROSS/BLUE SHIELD | Admitting: Internal Medicine

## 2017-10-20 DIAGNOSIS — F331 Major depressive disorder, recurrent, moderate: Secondary | ICD-10-CM | POA: Diagnosis not present

## 2017-10-28 ENCOUNTER — Ambulatory Visit: Payer: Self-pay | Admitting: *Deleted

## 2017-10-28 NOTE — Telephone Encounter (Signed)
Please advise 

## 2017-10-28 NOTE — Telephone Encounter (Signed)
Pt called with pain in the right upper abd, under the right rib cage. She states that this has been going on for about a year now. The pain comes and goes and it is not necessarily after she eats. She can feel a lump about the size of a golf ball in that area. She does not want an appointment right now. She is awaiting to hear back from her pcp regarding what she is recommending next.  She faxed her CT Scan report to the office for her to see.  She is requesting what she should take for pain.  Skyped flow a [employer] regarding patient's Careers information officer. Answer Assessment - Initial Assessment Questions 1. LOCATION: "Where does it hurt?"      Right upper abd 2. RADIATION: "Does the pain shoot anywhere else?" (e.g., chest, back)     Goes a little to the left but mostly on the right 3. ONSET: "When did the pain begin?" (e.g., minutes, hours or days ago)      Dealing with it for about a year, more persistent 4. SUDDEN: "Gradual or sudden onset?"     gradual 5. PATTERN "Does the pain come and go, or is it constant?"    - If constant: "Is it getting better, staying the same, or worsening?"      (Note: Constant means the pain never goes away completely; most serious pain is constant and it progresses)     - If intermittent: "How long does it last?" "Do you have pain now?"     (Note: Intermittent means the pain goes away completely between bouts)     Comes and goes 6. SEVERITY: "How bad is the pain?"  (e.g., Scale 1-10; mild, moderate, or severe)    - MILD (1-3): doesn't interfere with normal activities, abdomen soft and not tender to touch     - MODERATE (4-7): interferes with normal activities or awakens from sleep, tender to touch     - SEVERE (8-10): excruciating pain, doubled over, unable to do any normal activities       Pain #6 7. RECURRENT SYMPTOM: "Have you ever had this type of abdominal pain before?" If so, ask: "When was the last time?" and "What happened that time?"      Has  had it before during the last year 8. AGGRAVATING FACTORS: "Does anything seem to cause this pain?" (e.g., foods, stress, alcohol)     Eating anything,causes it to flare. 9. CARDIAC SYMPTOMS: "Do you have any of the following symptoms: chest pain, difficulty breathing, sweating, nausea?"     nauseous 10. OTHER SYMPTOMS: "Do you have any other symptoms?" (e.g., fever, vomiting, diarrhea)       A little bit chills or shakes 11. PREGNANCY: "Is there any chance you are pregnant?" "When was your last menstrual period?"       LMP dont have one  Protocols used: ABDOMINAL PAIN - UPPER-A-AH

## 2017-10-28 NOTE — Telephone Encounter (Signed)
Noted. If going on for a year and would like to hear from [place]. I will send to her.

## 2017-11-02 NOTE — Telephone Encounter (Signed)
I rec she see GI  Id also ordered an US abdomen that was not done  Does she want me to refer for these things?  Thanks [employer]

## 2017-11-02 NOTE — Telephone Encounter (Signed)
Brock Ensuring you got this.  You may take me off future communicaton :)

## 2017-11-03 ENCOUNTER — Telehealth: Payer: Self-pay

## 2017-11-03 NOTE — Telephone Encounter (Signed)
-----   Message from Delorise Jackson, MD sent at 11/02/2017  8:07 PM EDT ----- No gallstones in gallbladder per ultrasound 11/05/16, common bile duct slightly increased in size higher end of normal size and concerned for fatty liver on ultrasound  Other gallbladder study 11/11/16 was normal   Does she want referral to GI for abdominal pain ?

## 2017-11-03 NOTE — Telephone Encounter (Signed)
Left message for patient to return call back. [facility] may give results and obtain information.  

## 2017-11-04 NOTE — Telephone Encounter (Signed)
Left message for patient to return call back. [facility] may give information below From Dr. Olivia Mackie.   I recommend she see GI  Id also ordered an US abdomen, that was not done   Does she want me to refer for these things?

## 2017-11-05 ENCOUNTER — Telehealth: Payer: Self-pay | Admitting: Internal Medicine

## 2017-11-05 ENCOUNTER — Other Ambulatory Visit: Payer: Self-pay | Admitting: Internal Medicine

## 2017-11-05 DIAGNOSIS — R1011 Right upper quadrant pain: Secondary | ICD-10-CM

## 2017-11-05 DIAGNOSIS — R945 Abnormal results of liver function studies: Secondary | ICD-10-CM

## 2017-11-05 DIAGNOSIS — K760 Fatty (change of) liver, not elsewhere classified: Secondary | ICD-10-CM

## 2017-11-05 NOTE — Telephone Encounter (Signed)
Sent [facility] in [place]   [employer]

## 2017-11-05 NOTE — Telephone Encounter (Signed)
PT calling to get results.

## 2017-11-05 NOTE — Telephone Encounter (Signed)
Patient called, left VM to return call to the office for results. 

## 2017-11-05 NOTE — Telephone Encounter (Signed)
FYI

## 2017-11-05 NOTE — Telephone Encounter (Signed)
Called in and was given U/S results from 11/02/17.  No gallstones in gallbladder per U/S 11/05/17, common bile duct slightly increased in size higher end of normal size and concerned for fatty liver on U/S.  Other gallbladder study 11/11/16 was normal.  Does she want a referral to GI for abd pain?  She does want a referral to a GI doctor.   Whoever Dr. Aundra Dubin recommends is fine.

## 2017-11-10 DIAGNOSIS — F331 Major depressive disorder, recurrent, moderate: Secondary | ICD-10-CM | POA: Diagnosis not present

## 2017-11-10 NOTE — Telephone Encounter (Signed)
Please advise 

## 2017-11-10 NOTE — Telephone Encounter (Signed)
Patient wants [name] to give her a call back about this. Call back [phone]

## 2017-11-11 NOTE — Telephone Encounter (Signed)
Pt calling back requesting [place] be done. I provided her with centralized scheduling phone number so she can set that up at her convenience.

## 2017-11-11 NOTE — Telephone Encounter (Signed)
Unable to leave message for patient to return call back. PEC may obtain information. Patients number [phone].

## 2017-11-11 NOTE — Telephone Encounter (Signed)
New referral may need to be placed since patient changed mind

## 2017-11-12 ENCOUNTER — Other Ambulatory Visit: Payer: Self-pay | Admitting: Internal Medicine

## 2017-11-12 DIAGNOSIS — R1084 Generalized abdominal pain: Secondary | ICD-10-CM

## 2017-11-12 NOTE — Telephone Encounter (Signed)
Melissa please check on referral to Dr. Oneida Alar GI in [place]  Advise pt I tried to get in with [facility] and they are booked until may and rec she f/u with Dr. Oneida Alar in [place] fo rabdominal pain   Thanks [place]

## 2017-11-13 ENCOUNTER — Ambulatory Visit
Admission: RE | Admit: 2017-11-13 | Discharge: 2017-11-13 | Disposition: A | Discharge status: Home / Self Care | Payer: BLUE CROSS/BLUE SHIELD | Source: Ambulatory Visit | Attending: Internal Medicine | Admitting: Internal Medicine

## 2017-11-13 DIAGNOSIS — R748 Abnormal levels of other serum enzymes: Secondary | ICD-10-CM | POA: Insufficient documentation

## 2017-11-13 DIAGNOSIS — R1011 Right upper quadrant pain: Secondary | ICD-10-CM | POA: Insufficient documentation

## 2017-11-13 DIAGNOSIS — R1084 Generalized abdominal pain: Secondary | ICD-10-CM | POA: Insufficient documentation

## 2017-11-16 NOTE — Progress Notes (Signed)
Yes she does on 5/9

## 2017-11-17 DIAGNOSIS — F331 Major depressive disorder, recurrent, moderate: Secondary | ICD-10-CM | POA: Diagnosis not present

## 2017-11-24 DIAGNOSIS — F331 Major depressive disorder, recurrent, moderate: Secondary | ICD-10-CM | POA: Diagnosis not present

## 2017-12-01 DIAGNOSIS — F331 Major depressive disorder, recurrent, moderate: Secondary | ICD-10-CM | POA: Diagnosis not present

## 2017-12-04 ENCOUNTER — Other Ambulatory Visit: Payer: Self-pay | Admitting: Family Medicine

## 2017-12-07 NOTE — Telephone Encounter (Signed)
Looks like pt has transferred to an internal medicine

## 2017-12-15 ENCOUNTER — Ambulatory Visit: Payer: Self-pay | Admitting: *Deleted

## 2017-12-15 ENCOUNTER — Telehealth: Payer: Self-pay | Admitting: Internal Medicine

## 2017-12-15 ENCOUNTER — Telehealth: Payer: Self-pay

## 2017-12-15 ENCOUNTER — Other Ambulatory Visit: Payer: Self-pay

## 2017-12-15 DIAGNOSIS — F331 Major depressive disorder, recurrent, moderate: Secondary | ICD-10-CM | POA: Diagnosis not present

## 2017-12-15 MED ORDER — AMLODIPINE BESYLATE 10 MG PO TABS: 10.0000 mg | ORAL_TABLET | Freq: Every day | ORAL | 1 refills | Status: DC

## 2017-12-15 NOTE — Telephone Encounter (Signed)
Patient would like refill for amlodipine to [facility] [address]

## 2017-12-15 NOTE — Telephone Encounter (Signed)
Please advise. It looks like this was prescribed by another provider but patient has been seen by you before and has a scheduled appointment with you. Patient is currently out of this medication and BP is elevated due to being out. Would you like to refill?

## 2017-12-15 NOTE — Telephone Encounter (Signed)
p pec nu  Copied from [place] [id number]. Topic: Quick Communication - Rx Refill/Question >> Dec 15, 2017  8:12 AM Arletha Grippe wrote: Medication: amLODipine (NORVASC) 10 MG tablet Has the patient contacted their pharmacy? Yes.   (Agent: If no, request that the patient contact the pharmacy for the refill.) Preferred Pharmacy (with phone number or street name): [facility] [address]  Agent: Please be advised that RX refills may take up to 3 business days. We ask that you follow-up with your pharmacy. Pt is out of medication

## 2017-12-15 NOTE — Telephone Encounter (Signed)
Patient has been out of medication for a week- she has been trying to get it but the pharmacy states they do not have it.   Patient states her BP is 159/98 and 186/96 last night. Patient is having headaches. Patient [phone]. Patient has follow up appointment on 01/06/18. She is reluctant to make an annual exam appointment because her insurance will not pay but for 1 yearly.   Patient instructed to call office if the medication does not bring her BP down.  Reason for Disposition . [1] Request for URGENT new prescription or refill of "essential" medication (i.e., likelihood of harm to patient if not taken) AND [2] triager unable to fill per unit policy  Protocols used: MEDICATION QUESTION CALL-A-AH

## 2017-12-16 ENCOUNTER — Other Ambulatory Visit: Payer: Self-pay

## 2017-12-16 ENCOUNTER — Ambulatory Visit
Admission: EM | Admit: 2017-12-16 | Discharge: 2017-12-16 | Disposition: A | Discharge status: Home / Self Care | Payer: BLUE CROSS/BLUE SHIELD | Attending: Family Medicine | Admitting: Family Medicine

## 2017-12-16 ENCOUNTER — Other Ambulatory Visit: Payer: Self-pay | Admitting: Internal Medicine

## 2017-12-16 ENCOUNTER — Encounter: Payer: Self-pay | Admitting: Emergency Medicine

## 2017-12-16 ENCOUNTER — Ambulatory Visit: Payer: Self-pay

## 2017-12-16 DIAGNOSIS — S161XXA Strain of muscle, fascia and tendon at neck level, initial encounter: Secondary | ICD-10-CM | POA: Diagnosis not present

## 2017-12-16 MED ORDER — TIZANIDINE HCL 4 MG PO CAPS: 4.0000 mg | ORAL_CAPSULE | Freq: Three times a day (TID) | ORAL | 0 refills | Status: DC

## 2017-12-16 MED ORDER — NAPROXEN 500 MG PO TABS: 500.0000 mg | ORAL_TABLET | Freq: Two times a day (BID) | ORAL | 0 refills | Status: DC

## 2017-12-16 NOTE — Discharge Instructions (Signed)
Use  Caution while taking muscle relaxers.  Do not perform activities requiring concentration or judgment and do not drive. Apply ice 20 minutes out of every 2 hours 4-5 times daily for comfort.  °

## 2017-12-16 NOTE — ED Triage Notes (Signed)
Patient states she was feeling lightheaded and blurred vision yesterday today headache and right arm pain

## 2017-12-16 NOTE — ED Provider Notes (Signed)
[facility]    CSN: [id number] Arrival date & time: 12/16/17  1617     History   Chief Complaint Chief Complaint  Patient presents with  . Joint Pain    HPI [name] is a 42 y.o. female.   HPI  42 year old female states that she yesterday began feeling lightheaded had blurred vision with a headache and right arm pressure that even extended into her right leg.  She has a history of hypertension.  She has a history of migraines.  The headache was not migrainous.  Towards the evening it improved.  Neck is painful with movement.           Past Medical History:  Diagnosis Date  . Abdominal pain, epigastric 12/17/2015  . Anxiety    pt denies having dx of anxiety  . BCC (basal cell carcinoma of skin)    right foot   . Bunion, right foot   . Chronic cough   . Closed fibular fracture 10/2008   Left - Sustained 2/2 fall down stairs (same fall as tibular fracture). S/P internal fixation.  . Duodenitis    EGD (07/14/2011) showing chronic duodenitis, H.pyloir neg  . Elevated liver enzymes   . Endometriosis    S/P TAH and salpingo-oophorectomy, right  . Epigastric pain    Chronic, thought 2/2 gastritis, EGD (07/14/2011) showing chronic duodenitis, H.pyloir neg // Repeat EGD (09/2011) - esophagealring, sessible polyp in stomach body, hiatal hernia - rec ad probiotic, await bx  . Esophageal ring    s/p dilatation during EGD (07/2011) - Dr. Oneida Alar  . Gastritis    Thought due to chronic NSAID use 2/2 pain from her congenital hip abnormality  . Headache(784.0)    Migraines  . Hidradenitis suppurativa 05/2006  . 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 With chronic OA of hip  . History of migraine headaches    Typical symptoms - bright lights, unilateral (starting behind her left ear), throbbing .  Marland Kitchen Hypertension   . Internal hemorrhoids   . Osteoarthritis of hip   . Ovarian cancer ([place])  1995  . Protrusio acetabuli    diagnosed at age 43  . Sessile colonic polyp    noted 09/2011 -- > rec colonoscopy in 10 years, 2023  . Surgical menopause 08/2007   On HRT. Occuring since 01/09 following TAH and R-salpingo-oophorectomy   . Tibial fracture 10/2008   Left - Sustained 2/2 fall down stairs. Patient is S/P closed internal fixation with Smith Nephew tibial nail locked proximal and distal  . Vitamin D deficiency 11/2009   Vit D level 15 in 11/2009    Patient Active Problem List   Diagnosis Date Noted  . Hidradenitis suppurativa 10/11/2017  . Chronic frontal sinusitis 10/11/2017  . Fatty liver 10/11/2017  . Chronic ethmoidal sinusitis 09/13/2017  . Photophobia of both eyes 09/13/2017  . Abnormal liver enzymes 09/13/2017  . History of hysterectomy with bilateral oophorectomy 06/24/2017  . Estrogen deficiency 06/24/2017  . Overweight 11/17/2016  . Abdominal pain 11/04/2016  . Headache 12/17/2015  . Hiatal hernia 02/20/2012  . Chronic hip pain 02/20/2012  . Acetabulum intrapelvic protrusion into pelvic region or thigh 05/15/2011  . Hypertension 03/20/2010  . Vitamin D deficiency 08/20/2009  . MIGRAINE HEADACHE 05/13/2006    Past Surgical History:  Procedure Laterality Date  . ABDOMINAL HYSTERECTOMY    . CESAREAN SECTION  08/2005   Primary low transverse  . COLONOSCOPY  09/22/2011   CZY:[id number] polyp in the rectum/Internal hemorrhoids, benign path  . ESOPHAGOGASTRODUODENOSCOPY  09/22/2011   WFU:XNAT, esophageal/Sessile polyp in the body of the stomach/Hiatal hernia/ABDOMINAL PAIN LIKELY FUNCTIONAL V. POST-INFECTIOUS IBS, path: mild chronic gastritis  . FRACTURE SURGERY     Right hip  . JOINT REPLACEMENT     right hip  . LAPAROSCOPY  10/2000   Operative laparoscopy with lysis of right adnexal adhesions and uterointestinal adhesions  . left tib fib    . OTHER SURGICAL HISTORY  02/2009   Closed treatment internal fixation, left tibia with Tamala Julian and Nephew tibial nail  locked proximal and distal  . TOTAL ABDOMINAL HYSTERECTOMY W/ BILATERAL SALPINGOOPHORECTOMY  08/2007   for endometriosis. With concurrent right salpingo-oophorectomy  (2009), left oophorectomy (1995)  . TUBAL LIGATION  08/2005   Right tubal ligation    OB History   None      Home Medications    Prior to Admission medications   Medication Sig Start Date End Date Taking? Authorizing Provider  amLODipine (NORVASC) 10 MG tablet Take 1 tablet (10 mg total) by mouth daily. 12/15/17  Yes McLean-Scocuzza, Nino Glow, MD  estradiol (ESTRACE) 1 MG tablet Take 1 mg by mouth daily.   Yes [provider]  naproxen (NAPROSYN) 500 MG tablet Take 1 tablet (500 mg total) by mouth 2 (two) times daily with a meal. 12/16/17   Lorin Picket, PA-C  tiZANidine (ZANAFLEX) 4 MG capsule Take 1 capsule (4 mg total) by mouth 3 (three) times daily. 12/16/17   Lorin Picket, PA-C    Family History Family History  Problem Relation Age of Onset  . Other Father        deceased while in service.   . Breast cancer Sister 76       remission  . Cancer Sister        breast  . Lupus Cousin   . Colon cancer Neg Hx   . Liver disease Neg Hx     Social History Social History   Tobacco Use  . Smoking status: Never Smoker  . Smokeless tobacco: Never Used  Substance Use Topics  . Alcohol use: No    Alcohol/week: 0.0 oz  . Drug use: No     Allergies   Pineapple; Latex; Morphine and related; and Tape   Review of Systems Review of Systems  Constitutional: Positive for activity change. Negative for chills, fatigue and fever.  Musculoskeletal: Positive for neck pain and neck stiffness.  All other systems reviewed and are negative.    Physical Exam Triage Vital Signs ED Triage Vitals  Enc Vitals Group     BP 12/16/17 1637 (!) 153/105     Pulse Rate 12/16/17 1637 66     Resp 12/16/17 1637 16     Temp 12/16/17 1637 98.6 F (37 C)     Temp Source 12/16/17 1637 Oral     SpO2 12/16/17 1637 100  %     Weight 12/16/17 1639 162 lb (73.5 kg)     Height 12/16/17 1639 6' (1.829 m)     Head Circumference --      Peak Flow --      Pain Score 12/16/17 1638 7     Pain Loc --      Pain Edu? --      Excl. in [place]? --    No data found.  Updated Vital Signs BP (!) 153/105 (BP Location: Left Arm)   Pulse 66  Temp 98.6 F (37 C) (Oral)   Resp 16   Ht 6' (1.829 m)   Wt 162 lb (73.5 kg)   SpO2 100%   BMI 21.97 kg/m   Visual Acuity Right Eye Distance:   Left Eye Distance:   Bilateral Distance:    Right Eye Near:   Left Eye Near:    Bilateral Near:     Physical Exam  Constitutional: She is oriented to person, place, and time. She appears well-developed and well-nourished. No distress.  HENT:  Head: Normocephalic.  Right Ear: External ear normal.  Left Ear: External ear normal.  Nose: Nose normal.  Mouth/Throat: Oropharynx is clear and moist. No oropharyngeal exudate.  Eyes: Pupils are equal, round, and reactive to light. EOM are normal. Right eye exhibits no discharge. Left eye exhibits no discharge.  Neck:  Examination of the cervical spine shows limitation of motion with discomfort at extremes particularly with flexion and rotation.  More noticeable to the right.  Tenderness to palpation in the paraspinous muscles on the right extending into the trapezius.  Upper extremity strength and sensation are intact.  DTRs are 2+/4 and symmetrical.  Pulmonary/Chest: Effort normal and breath sounds normal.  Musculoskeletal: Normal range of motion. She exhibits tenderness.  Neurological: She is alert and oriented to person, place, and time. She displays normal reflexes. No cranial nerve deficit or sensory deficit. She exhibits normal muscle tone. Coordination normal.  Skin: Skin is warm and dry. She is not diaphoretic.  Psychiatric: She has a normal mood and affect. Her behavior is normal. Judgment and thought content normal.  Nursing note and vitals reviewed.    UC Treatments /  Results  Labs (all labs ordered are listed, but only abnormal results are displayed) Labs Reviewed - No data to display  EKG None  Radiology No results found.  Procedures Procedures (including critical care time)  Medications Ordered in UC Medications - No data to display  Initial Impression / Assessment and Plan / UC Course  I have reviewed the triage vital signs and the nursing notes.  Pertinent labs & imaging results that were available during my care of the patient were reviewed by me and considered in my medical decision making (see chart for details).     Plan: 1. Test/x-ray results and diagnosis reviewed with patient 2. rx as per orders; risks, benefits, potential side effects reviewed with patient 3. Recommend supportive treatment with rest and symptom avoidance.  Prescribe a nonsteroidal anti-inflammatory as well as muscle relaxer to see if this will improve her symptoms.  She worsens or is not improving I recommend she follow-up with her primary care. 4. F/u prn if symptoms worsen or don't improve  Final Clinical Impressions(s) / UC Diagnoses   Final diagnoses:  Cervical strain, acute, initial encounter     Discharge Instructions     Use  Caution while taking muscle relaxers.  Do not perform activities requiring concentration or judgment and do not drive. Apply ice 20 minutes out of every 2 hours 4-5 times daily for comfort.    ED Prescriptions    Medication Sig Dispense Auth. Provider   naproxen (NAPROSYN) 500 MG tablet Take 1 tablet (500 mg total) by mouth 2 (two) times daily with a meal. 60 tablet Crecencio Mc P, PA-C   tiZANidine (ZANAFLEX) 4 MG capsule Take 1 capsule (4 mg total) by mouth 3 (three) times daily. 21 capsule Lorin Picket, PA-C     Controlled Substance Prescriptions [place] Controlled Substance Registry  consulted? Not Applicable   Lorin Picket, PA-C 12/16/17 2033

## 2017-12-17 ENCOUNTER — Encounter: Payer: Self-pay | Admitting: Gastroenterology

## 2017-12-17 ENCOUNTER — Telehealth: Payer: Self-pay

## 2017-12-17 ENCOUNTER — Ambulatory Visit (INDEPENDENT_AMBULATORY_CARE_PROVIDER_SITE_OTHER): Payer: BLUE CROSS/BLUE SHIELD | Admitting: Gastroenterology

## 2017-12-17 ENCOUNTER — Other Ambulatory Visit: Payer: Self-pay

## 2017-12-17 DIAGNOSIS — R1011 Right upper quadrant pain: Secondary | ICD-10-CM

## 2017-12-17 DIAGNOSIS — E663 Overweight: Secondary | ICD-10-CM | POA: Diagnosis not present

## 2017-12-17 NOTE — Telephone Encounter (Signed)
Pt said as she was being discharged today, that she meant to ask Dr. Oneida Alar about her liver. She said her PCP has concerns about her liver. Per Dr. Oneida Alar, see OV note and let the pt know. I called and LMOM for a return call.

## 2017-12-17 NOTE — Assessment & Plan Note (Addendum)
INTENTIONAL WEIGHT LOSS: 6 LBS SINCE MAR 2018.  STRENGTHEN YOUR CORE MUSCLES. DO ABDOMINAL WORK OUT FOR 5-10 MINS THREE TIMES A WEEK. LOOK UP 5 MIN AB WORKOUT ON YOUTUBE. EAT 4 TO 6 SMALL MEALS DAILY. EAT A HIGH PROTEIN SNACK EVERY 3-4 HOURS. AVOID GOING MORE THAN 4 HOURS WITHOUT EATING. EAT A 200 CALORIE SNACK AT LEAST FOUR TIMES A DAY. Do not drink DIET SODA, AVOID HIGH FRUCTOSE CORN SYRUP, chew SUGAR FREE GUM, OR USE ARTIFICIAL SWEETENERS. USE STEVIA AS A SWEETENER OR AGAVE NECTAR.   FOLLOW UP IN 6 MOS TO ONE YEAR.

## 2017-12-17 NOTE — Assessment & Plan Note (Signed)
DUE TO ABDOMINAL WALL ETIOLOGY: LAXITY V. HERNIA.  TO REDUCE PAIN:    1.  ICE PACK THREE TIMES A DAY FOR 2 WEEKS. DO NOT APPLY ICE PACKS DIRECTLY TO YOUR SKIN.    2. USE ARNICARE OR TURMERIC PILLS UP TO THREE TIMES A DAY.    3. STRENGTHEN YOUR CORE MUSCLES. DO ABDOMINAL WORK OUT FOR 5-10 MINS THREE TIMES A WEEK. LOOK UP 5 MIN AB WORKOUT ON YOUTUBE.    4. SEE SURGERY FOR AN EVALUATION.  FOLLOW UP IN 6 MOS TO ONE YEAR.

## 2017-12-17 NOTE — Progress Notes (Signed)
PLEASE CALL PT. SHE HAD A CT IN 2017 AND AN [place] IN MAY 2019 THT SHOWED FAT IN HER LIVER. HER LIVER ENZYMES WERE NORMAL IN JAN 2019. IT IS DUE TO HER BEING OVERWEIGHT. A WEIGHT OF 175 LBS  WILL GET YOUR BODY MASS INDEX(BMI) UNDER 25-NORMAL.

## 2017-12-17 NOTE — Progress Notes (Signed)
Subjective:    Patient ID: [name], female    DOB: 03/25/1976, 42 y.o.   MRN: [id number]  McLean-Scocuzza, Nino Glow, MD   HPI CONSTIPATION BETTER. MAY GET [place] AFTER EATING??. PRACTICALLY EVERY DAY. DULL PAIN CAN LAST 10-20 TIMES  A DAY. LAST FOR DAYS. TRIGGERS: RANDOM. MAY BE SHARP WHEN SHE TRIES TO TIE HER SHOWS, BENDING OVER, LYING DOWN AND SITTING CAN BE UNCOMFORTABLE. NO INJURY/MVA. SYMPTOMS OVER A YEAR(SEEMS LIKE IT'S WORSE). EXERCISING: USING ELLIPTICAL(2X/WEEK, FOR 30 MINS, DOESN'T FEEL PAIN, DOESN'T MAKE IT WORSE). WHEN SHE ROLLS OVER FEELS  THE PAIN. NO SIT UPS OR PLANKS. NO HEAVY LIFTING. FEELS COLD OFF AND ON. BMs: 3-4 X/WEEK. EATING BETTER. FEELS EYES LOOK FUNNY. AND HAS FATIGUE.  FEELS HEAVY IN UPPER ABDOMEN. HAD COFFEE THIS AM WITH CREAM THIS AM. DRANK SOME.   PT DENIES FEVER, CHILLS, ITCHING, YELLOW EYES/SKIN, HEMATOCHEZIA, BLOATING, HEMATURIA, DYSURIA, HEMATEMESIS, nausea, vomiting, melena, diarrhea, CHEST PAIN, SHORTNESS OF BREATH,  CHANGE IN BOWEL IN HABITS, problems swallowing, OR heartburn or indigestion.   Past Medical History:  Diagnosis Date  . Abdominal pain, epigastric 12/17/2015  . Anxiety    pt denies having dx of anxiety  . BCC (basal cell carcinoma of skin)    right foot   . Bunion, right foot   . Chronic cough   . Closed fibular fracture 10/2008   Left - Sustained 2/2 fall down stairs (same fall as tibular fracture). S/P internal fixation.  . Duodenitis    EGD (07/14/2011) showing chronic duodenitis, H.pyloir neg  . Elevated liver enzymes   . Endometriosis    S/P TAH and salpingo-oophorectomy, right  . Epigastric pain    Chronic, thought 2/2 gastritis, EGD (07/14/2011) showing chronic duodenitis, H.pyloir neg // Repeat EGD (09/2011) - esophagealring, sessible polyp in stomach body, hiatal hernia - rec ad probiotic, await bx  . Esophageal ring    s/p dilatation during EGD (07/2011) - Dr. Oneida Alar  . Gastritis    Thought due to chronic NSAID  use 2/2 pain from her congenital hip abnormality  . Headache(784.0)    Migraines  . Hidradenitis suppurativa 05/2006  . 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 With chronic OA of hip  . History of migraine headaches    Typical symptoms - bright lights, unilateral (starting behind her left ear), throbbing .  Marland Kitchen Hypertension   . Internal hemorrhoids   . Osteoarthritis of hip   . Ovarian cancer ([place]) 1995  . Protrusio acetabuli    diagnosed at age 59  . Sessile colonic polyp    noted 09/2011 -- > rec colonoscopy in 10 years, 2023  . Surgical menopause 08/2007   On HRT. Occuring since 01/09 following TAH and R-salpingo-oophorectomy   . Tibial fracture 10/2008   Left - Sustained 2/2 fall down stairs. Patient is S/P closed internal fixation with Smith Nephew tibial nail locked proximal and distal  . Vitamin D deficiency 11/2009   Vit D level 15 in 11/2009    Past Surgical History:  Procedure Laterality Date  . ABDOMINAL HYSTERECTOMY    . CESAREAN SECTION  08/2005   Primary low transverse  . COLONOSCOPY  09/22/2011   [id number] polyp in the rectum/Internal hemorrhoids, benign path  . ESOPHAGOGASTRODUODENOSCOPY  09/22/2011   BHA:LPFX, esophageal/Sessile polyp in the body of the stomach/Hiatal hernia/ABDOMINAL PAIN LIKELY FUNCTIONAL V. POST-INFECTIOUS IBS, path: mild chronic gastritis  . FRACTURE SURGERY     Right  hip  . JOINT REPLACEMENT     right hip  . LAPAROSCOPY  10/2000   Operative laparoscopy with lysis of right adnexal adhesions and uterointestinal adhesions  . left tib fib    . OTHER SURGICAL HISTORY  02/2009   Closed treatment internal fixation, left tibia with Tamala Julian and Nephew tibial nail locked proximal and distal  . TOTAL ABDOMINAL HYSTERECTOMY W/ BILATERAL SALPINGOOPHORECTOMY  08/2007   for endometriosis. With concurrent right salpingo-oophorectomy  (2009), left oophorectomy (1995)  . TUBAL LIGATION   08/2005   Right tubal ligation   Allergies  Allergen Reactions  . Pineapple Shortness Of Breath and Swelling    Has Epi pen  . Latex Swelling  . Morphine And Related Itching  . Tape Swelling    Current Outpatient Medications  Medication Sig    . amLODipine (NORVASC) 10 MG tablet Take 1 tablet (10 mg total) by mouth daily.    Marland Kitchen estradiol (ESTRACE) 1 MG tablet Take 1 mg by mouth daily.    . naproxen (NAPROSYN) 500 MG tablet Take 1 tablet (500 mg total) by mouth 2 (two) times daily with a meal. (Patient not taking: Reported on 12/17/2017)    .       Review of Systems PER HPI OTHERWISE ALL SYSTEMS ARE NEGATIVE.    Objective:   Physical Exam  Constitutional: She is oriented to person, place, and time. She appears well-developed and well-nourished. No distress.  HENT:  Head: Normocephalic and atraumatic.  Mouth/Throat: Oropharynx is clear and moist. No oropharyngeal exudate.  Eyes: Pupils are equal, round, and reactive to light. No scleral icterus.  Neck: Normal range of motion. Neck supple.  Cardiovascular: Normal rate, regular rhythm and normal heart sounds.  Pulmonary/Chest: Effort normal and breath sounds normal. No respiratory distress.  Abdominal: Soft. Bowel sounds are normal. She exhibits no distension. There is tenderness (MODERATE, between the rectus abdominus and the obliques, REPRODUCES COMPLAINT, POSITIVE CARNETT'S SIGN) in the right upper quadrant.  Musculoskeletal: She exhibits no edema.  Lymphadenopathy:    She has no cervical adenopathy.  Neurological: She is alert and oriented to person, place, and time.  NO FOCAL DEFICITS  Psychiatric: She has a normal mood and affect.  Vitals reviewed.     Assessment & Plan:

## 2017-12-17 NOTE — Patient Instructions (Addendum)
TO REDUCE PAIN:    1.  ICE PACK THREE TIMES A DAY FOR 2 WEEKS. DO NOT APPLY ICE PACKS DIRECTLY TO YOUR SKIN.     2. USE ARNICARE OR TURMERIC PILLS UP TO THREE TIMES A DAY.     3. STRENGTHEN YOUR CORE MUSCLES. DO ABDOMINAL WORK OUT FOR 5-10 MINS THREE TIMES A WEEK. LOOK UP 5 MIN AB WORKOUT ON YOUTUBE.     4. SEE SURGERY FOR AN EVALUATION.  TO LOSE WEIGHT:     1. DRINK WATER TO KEEP YOUR URINE LIGHT YELLOW. Do not drink DIET SODA, AVOID HIGH FRUCTOSE CORN SYRUP, chew SUGAR FREE GUM, OR USE ARTIFICIAL SWEETENERS. USE STEVIA AS A SWEETENER OR AGAVE NECTAR.     2. EAT 4 TO 6 SMALL MEALS DAILY. EAT A HIGH PROTEIN SNACK EVERY 3-4 HOURS. AVOID GOING MORE THAN 4 HOURS WITHOUT EATING. EAT A 200 CALORIE SNACK AT LEAST 4 TIMES A DAY.    FOLLOW UP IN 6 MOS TO ONE YEAR.

## 2017-12-18 NOTE — Progress Notes (Signed)
ON RECALL  °

## 2017-12-18 NOTE — Progress Notes (Signed)
CC'D TO PCP °

## 2017-12-21 NOTE — Telephone Encounter (Signed)
LMOM to call.

## 2017-12-22 NOTE — Telephone Encounter (Signed)
Letter mailed to pt to call.  

## 2017-12-29 DIAGNOSIS — F331 Major depressive disorder, recurrent, moderate: Secondary | ICD-10-CM | POA: Diagnosis not present

## 2018-01-05 DIAGNOSIS — F331 Major depressive disorder, recurrent, moderate: Secondary | ICD-10-CM | POA: Diagnosis not present

## 2018-01-06 ENCOUNTER — Telehealth: Payer: Self-pay

## 2018-01-06 ENCOUNTER — Encounter: Payer: Self-pay | Admitting: Internal Medicine

## 2018-01-06 ENCOUNTER — Ambulatory Visit (INDEPENDENT_AMBULATORY_CARE_PROVIDER_SITE_OTHER): Payer: BLUE CROSS/BLUE SHIELD

## 2018-01-06 ENCOUNTER — Ambulatory Visit (INDEPENDENT_AMBULATORY_CARE_PROVIDER_SITE_OTHER): Payer: BLUE CROSS/BLUE SHIELD | Admitting: Internal Medicine

## 2018-01-06 ENCOUNTER — Other Ambulatory Visit: Payer: Self-pay | Admitting: Internal Medicine

## 2018-01-06 VITALS — BP 130/88 | HR 67 | Temp 98.0°F | Ht 71.5 in | Wt 197.2 lb

## 2018-01-06 DIAGNOSIS — K760 Fatty (change of) liver, not elsewhere classified: Secondary | ICD-10-CM | POA: Diagnosis not present

## 2018-01-06 DIAGNOSIS — M5030 Other cervical disc degeneration, unspecified cervical region: Secondary | ICD-10-CM

## 2018-01-06 DIAGNOSIS — M5136 Other intervertebral disc degeneration, lumbar region: Secondary | ICD-10-CM

## 2018-01-06 DIAGNOSIS — R200 Anesthesia of skin: Secondary | ICD-10-CM

## 2018-01-06 DIAGNOSIS — K449 Diaphragmatic hernia without obstruction or gangrene: Secondary | ICD-10-CM | POA: Diagnosis not present

## 2018-01-06 DIAGNOSIS — R1011 Right upper quadrant pain: Secondary | ICD-10-CM | POA: Diagnosis not present

## 2018-01-06 DIAGNOSIS — M5032 Other cervical disc degeneration, mid-cervical region, unspecified level: Secondary | ICD-10-CM | POA: Diagnosis not present

## 2018-01-06 DIAGNOSIS — G43909 Migraine, unspecified, not intractable, without status migrainosus: Secondary | ICD-10-CM

## 2018-01-06 DIAGNOSIS — M51369 Other intervertebral disc degeneration, lumbar region without mention of lumbar back pain or lower extremity pain: Secondary | ICD-10-CM

## 2018-01-06 DIAGNOSIS — I10 Essential (primary) hypertension: Secondary | ICD-10-CM | POA: Diagnosis not present

## 2018-01-06 DIAGNOSIS — M48061 Spinal stenosis, lumbar region without neurogenic claudication: Secondary | ICD-10-CM | POA: Diagnosis not present

## 2018-01-06 DIAGNOSIS — R748 Abnormal levels of other serum enzymes: Secondary | ICD-10-CM

## 2018-01-06 DIAGNOSIS — M5412 Radiculopathy, cervical region: Secondary | ICD-10-CM

## 2018-01-06 DIAGNOSIS — M4802 Spinal stenosis, cervical region: Secondary | ICD-10-CM | POA: Diagnosis not present

## 2018-01-06 DIAGNOSIS — M47812 Spondylosis without myelopathy or radiculopathy, cervical region: Secondary | ICD-10-CM | POA: Diagnosis not present

## 2018-01-06 NOTE — Telephone Encounter (Signed)
REVIEWED. Spoke to Dr. Aundra Dubin. Explained pt has no evidence for GB disease, NO SOURCE for RUQ pain identified. Needs to see surgery for hernia evaluation or to CONSIDER EX LAP. PT LIKELY HAS FUNCTIONAL ABDOMINAL PAIN OR NON-ULCER DYSPEPSIA.

## 2018-01-06 NOTE — Patient Instructions (Addendum)
Will refer to [facility] [place]  We will refer you to Neurology in [facility]    Migraine Headache A migraine headache is an intense, throbbing pain on one side or both sides of the head. Migraines may also cause other symptoms, such as nausea, vomiting, and sensitivity to light and noise. What are the causes? Doing or taking certain things may also trigger migraines, such as:  Alcohol.  Smoking.  Medicines, such as: ? Medicine used to treat chest pain (nitroglycerine). ? Birth control pills. ? Estrogen pills. ? Certain blood pressure medicines.  Aged cheeses, chocolate, or caffeine.  Foods or drinks that contain nitrates, glutamate, aspartame, or tyramine.  Physical activity.  Other things that may trigger a migraine include:  Menstruation.  Pregnancy.  Hunger.  Stress, lack of sleep, too much sleep, or fatigue.  Weather changes.  What increases the risk? The following factors may make you more likely to experience migraine headaches:  Age. Risk increases with age.  Family history of migraine headaches.  Being Caucasian.  Depression and anxiety.  Obesity.  Being a woman.  Having a hole in the heart (patent foramen ovale) or other heart problems.  What are the signs or symptoms? The main symptom of this condition is pulsating or throbbing pain. Pain may:  Happen in any area of the head, such as on one side or both sides.  Interfere with daily activities.  Get worse with physical activity.  Get worse with exposure to bright lights or loud noises.  Other symptoms may include:  Nausea.  Vomiting.  Dizziness.  General sensitivity to bright lights, loud noises, or smells.  Before you get a migraine, you may get warning signs that a migraine is developing (aura). An aura may include:  Seeing flashing lights or having blind spots.  Seeing bright spots, halos, or zigzag lines.  Having tunnel vision or blurred vision.  Having numbness  or a tingling feeling.  Having trouble talking.  Having muscle weakness.  How is this diagnosed? A migraine headache can be diagnosed based on:  Your symptoms.  A physical exam.  Tests, such as CT scan or MRI of the head. These imaging tests can help rule out other causes of headaches.  Taking fluid from the spine (lumbar puncture) and analyzing it (cerebrospinal fluid analysis, or CSF analysis).  How is this treated? A migraine headache is usually treated with medicines that:  Relieve pain.  Relieve nausea.  Prevent migraines from coming back.  Treatment may also include:  Acupuncture.  Lifestyle changes like avoiding foods that trigger migraines.  Follow these instructions at home: Medicines  Take over-the-counter and prescription medicines only as told by your health care provider.  Do not drive or use heavy machinery while taking prescription pain medicine.  To prevent or treat constipation while you are taking prescription pain medicine, your health care provider may recommend that you: ? Drink enough fluid to keep your urine clear or pale yellow. ? Take over-the-counter or prescription medicines. ? Eat foods that are high in fiber, such as fresh fruits and vegetables, whole grains, and beans. ? Limit foods that are high in fat and processed sugars, such as fried and sweet foods. Lifestyle  Avoid alcohol use.  Do not use any products that contain nicotine or tobacco, such as cigarettes and e-cigarettes. If you need help quitting, ask your health care provider.  Get at least 8 hours of sleep every night.  Limit your stress. General instructions   Keep a  journal to find out what may trigger your migraine headaches. For example, write down: ? What you eat and drink. ? How much sleep you get. ? Any change to your diet or medicines.  If you have a migraine: ? Avoid things that make your symptoms worse, such as bright lights. ? It may help to lie down in  a dark, quiet room. ? Do not drive or use heavy machinery. ? Ask your health care provider what activities are safe for you while you are experiencing symptoms.  Keep all follow-up visits as told by your health care provider. This is important. Contact a health care provider if:  You develop symptoms that are different or more severe than your usual migraine symptoms. Get help right away if:  Your migraine becomes severe.  You have a fever.  You have a stiff neck.  You have vision loss.  Your muscles feel weak or like you cannot control them.  You start to lose your balance often.  You develop trouble walking.  You faint. This information is not intended to replace advice given to you by your health care provider. Make sure you discuss any questions you have with your health care provider. Document Released: 07/28/2005 Document Revised: 02/15/2016 Document Reviewed: 01/14/2016 Elsevier Interactive Patient Education  2017 Elsevier Inc.  Fatty Liver Fatty liver, also called hepatic steatosis or steatohepatitis, is a condition in which too much fat has built up in your liver cells. The liver removes harmful substances from your bloodstream. It produces fluids your body needs. It also helps your body use and store energy from the food you eat. In many cases, fatty liver does not cause symptoms or problems. It is often diagnosed when tests are being done for other reasons. However, over time, fatty liver can cause inflammation that may lead to more serious liver problems, such as scarring of the liver (cirrhosis). What are the causes? Causes of fatty liver may include:  Drinking too much alcohol.  Poor nutrition.  Obesity.  Cushing syndrome.  Diabetes.  Hyperlipidemia.  Pregnancy.  Certain drugs.  Poisons.  Some viral infections.  What increases the risk? You may be more likely to develop fatty liver if you:  Abuse alcohol.  Are pregnant.  Are  overweight.  Have diabetes.  Have hepatitis.  Have a high triglyceride level.  What are the signs or symptoms? Fatty liver often does not cause any symptoms. In cases where symptoms develop, they can include:  Fatigue.  Weakness.  Weight loss.  Confusion.  Abdominal pain.  Yellowing of your skin and the white parts of your eyes (jaundice).  Nausea and vomiting.  How is this diagnosed? Fatty liver may be diagnosed by:  Physical exam and medical history.  Blood tests.  Imaging tests, such as an ultrasound, CT scan, or MRI.  Liver biopsy. A small sample of liver tissue is removed using a needle. The sample is then looked at under a microscope.  How is this treated? Fatty liver is often caused by other health conditions. Treatment for fatty liver may involve medicines and lifestyle changes to manage conditions such as:  Alcoholism.  High cholesterol.  Diabetes.  Being overweight or obese.  Follow these instructions at home:  Eat a healthy diet as directed by your health care provider.  Exercise regularly. This can help you lose weight and control your cholesterol and diabetes. Talk to your health care provider about an exercise plan and which activities are best for you.  Do  not drink alcohol.  Take medicines only as directed by your health care provider. Contact a health care provider if: You have difficulty controlling your:  Blood sugar.  Cholesterol.  Alcohol consumption.  Get help right away if:  You have abdominal pain.  You have jaundice.  You have nausea and vomiting. This information is not intended to replace advice given to you by your health care provider. Make sure you discuss any questions you have with your health care provider. Document Released: 09/12/2005 Document Revised: 01/03/2016 Document Reviewed: 12/07/2013 Elsevier Interactive Patient Education  2018 [employer].  Nonalcoholic Fatty Liver Disease Diet Nonalcoholic  fatty liver disease is a condition that causes fat to accumulate in and around the liver. The disease makes it harder for the liver to work the way that it should. Following a healthy diet can help to keep nonalcoholic fatty liver disease under control. It can also help to prevent or improve conditions that are associated with the disease, such as heart disease, diabetes, high blood pressure, and abnormal cholesterol levels. Along with regular exercise, this diet:  Promotes weight loss.  Helps to control blood sugar levels.  Helps to improve the way that the body uses insulin.  What do I need to know about this diet?  Use the glycemic index (GI) to plan your meals. The index tells you how quickly a food will raise your blood sugar. Choose low-GI foods. These foods take a longer time to raise blood sugar.  Keep track of how many calories you take in. Eating the right amount of calories will help you to achieve a healthy weight.  You may want to follow a Mediterranean diet. This diet includes a lot of vegetables, lean meats or fish, whole grains, fruits, and healthy oils and fats. What foods can I eat? Grains Whole grains, such as whole-wheat or whole-grain breads, crackers, tortillas, cereals, and pasta. Stone-ground whole wheat. Pumpernickel bread. Unsweetened oatmeal. Bulgur. Barley. Quinoa. Brown or wild rice. Corn or whole-wheat flour tortillas. Vegetables Lettuce. Spinach. Peas. Beets. Cauliflower. Cabbage. Broccoli. Carrots. Tomatoes. Squash. Eggplant. Herbs. Peppers. Onions. Cucumbers. Brussels sprouts. Yams and sweet potatoes. Beans. Lentils. Fruits Bananas. Apples. Oranges. Grapes. Papaya. Mango. Pomegranate. Kiwi. Grapefruit. Cherries. Meats and Other Protein Sources Seafood and shellfish. Lean meats. Poultry. Tofu. Dairy Low-fat or fat-free dairy products, such as yogurt, cottage cheese, and cheese. Beverages Water. Sugar-free drinks. Tea. Coffee. Low-fat or skim milk. Milk  alternatives, such as soy or almond milk. Real fruit juice. Condiments Mustard. Relish. Low-fat, low-sugar ketchup and barbecue sauce. Low-fat or fat-free mayonnaise. Sweets and Desserts Sugar-free sweets. Fats and Oils Avocado. Canola or olive oil. Nuts and nut butters. Seeds. The items listed above may not be a complete list of recommended foods or beverages. Contact your dietitian for more options.  What foods are not recommended? Palm oil and coconut oil. Processed foods. Fried foods. Sweetened drinks, such as sweet tea, milkshakes, snow cones, iced sweet drinks, and sodas. Alcohol. Sweets. Foods that contain a lot of salt or sodium. The items listed above may not be a complete list of foods and beverages to avoid. Contact your dietitian for more information. This information is not intended to replace advice given to you by your health care provider. Make sure you discuss any questions you have with your health care provider. Document Released: 12/12/2014 Document Revised: 01/03/2016 Document Reviewed: 08/22/2014 Elsevier Interactive Patient Education  [employer].

## 2018-01-06 NOTE — Telephone Encounter (Signed)
T/C from pt's PCP, Dr. Olivia Mackie McLean-Scocuzza, at [facility] in [place]. Call back number is [phone].  She called with questions about Dr. Oneida Alar referral to a surgeon. She saw pt in the office today and would like to discuss with Dr. Oneida Alar.  She will try to call Dr. Oneida Alar back at the hospital if Dr. Oneida Alar does not get to call her.

## 2018-01-06 NOTE — Progress Notes (Addendum)
Chief Complaint  Patient presents with  . Follow-up   F/u  1. HTN controlled norvasc 10 mg qd   2. Still c/o RUQ pain worse with bending and bloating sensation she saw GI Dr. Barney Drain who pt reports wanted her to see surgery for "hernia". Reviewed [place] 11/13/17 + fatty liver but no gallstones and pt has upcoming surgery appt this week and is unclear why GI referred her to surgery. Pt also reports she was given some stretches to do for abdominal pain but it has not helped. Of note she had hida 11/11/16 which was negative   3. Fatty liver pt is concerned about this and feels like her questions were not answered with GI. She would like to know if it is reversible what diet should she follow she reports she is working out and eating healthy but unable to lose weight   4. H/o migraines since 2013 referred to neurology and never f/u. She reports with h/a vision changes and bothered by light sound and smells get more intense. She also has numbness of RUE and RLE intermittently but she is not sure if these episodes are related to h/a.    5. She does mention she has h/o intermittent neck and low back pain. Reviewed C spine Xrays with mild DDD in the past and low back Xrays nl in 2010     Review of Systems  Constitutional: Negative for weight loss.  HENT: Negative for hearing loss.   Eyes: Negative for blurred vision.  Respiratory: Negative for shortness of breath.   Cardiovascular: Negative for chest pain.  Gastrointestinal: Positive for abdominal pain.       +ab bloating    Musculoskeletal: Positive for back pain and neck pain.  Skin: Negative for rash.  Neurological: Positive for sensory change. Negative for headaches.  Psychiatric/Behavioral: Negative for depression.   Past Medical History:  Diagnosis Date  . Abdominal pain, epigastric 12/17/2015  . Anxiety    pt denies having dx of anxiety  . BCC (basal cell carcinoma of skin)    right foot   . Bunion, right foot   . Chronic cough   .  Closed fibular fracture 10/2008   Left - Sustained 2/2 fall down stairs (same fall as tibular fracture). S/P internal fixation.  . Duodenitis    EGD (07/14/2011) showing chronic duodenitis, H.pyloir neg  . Elevated liver enzymes   . Endometriosis    S/P TAH and salpingo-oophorectomy, right  . Epigastric pain    Chronic, thought 2/2 gastritis, EGD (07/14/2011) showing chronic duodenitis, H.pyloir neg // Repeat EGD (09/2011) - esophagealring, sessible polyp in stomach body, hiatal hernia - rec ad probiotic, await bx  . Esophageal ring    s/p dilatation during EGD (07/2011) - Dr. Oneida Alar  . Gastritis    Thought due to chronic NSAID use 2/2 pain from her congenital hip abnormality  . Headache(784.0)    Migraines  . Hidradenitis suppurativa 05/2006  . 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 With chronic OA of hip  . History of migraine headaches    Typical symptoms - bright lights, unilateral (starting behind her left ear), throbbing .  Marland Kitchen Hypertension   . Internal hemorrhoids   . Osteoarthritis of hip   . Ovarian cancer ([place]) 1995  . Protrusio acetabuli    diagnosed at age 72  . Sessile colonic polyp    noted 09/2011 -- > rec colonoscopy in 10 years, 2023  .  Surgical menopause 08/2007   On HRT. Occuring since 01/09 following TAH and R-salpingo-oophorectomy   . Tibial fracture 10/2008   Left - Sustained 2/2 fall down stairs. Patient is S/P closed internal fixation with Smith Nephew tibial nail locked proximal and distal  . Vitamin D deficiency 11/2009   Vit D level 15 in 11/2009   Past Surgical History:  Procedure Laterality Date  . ABDOMINAL HYSTERECTOMY    . CESAREAN SECTION  08/2005   Primary low transverse  . COLONOSCOPY  09/22/2011   TGG:YIRSWNI polyp in the rectum/Internal hemorrhoids, benign path  . ESOPHAGOGASTRODUODENOSCOPY  09/22/2011   OEV:OJJK, esophageal/Sessile polyp in the body of the stomach/Hiatal  hernia/ABDOMINAL PAIN LIKELY FUNCTIONAL V. POST-INFECTIOUS IBS, path: mild chronic gastritis  . FRACTURE SURGERY     Right hip  . JOINT REPLACEMENT     right hip  . LAPAROSCOPY  10/2000   Operative laparoscopy with lysis of right adnexal adhesions and uterointestinal adhesions  . left tib fib    . OTHER SURGICAL HISTORY  02/2009   Closed treatment internal fixation, left tibia with Tamala Julian and Nephew tibial nail locked proximal and distal  . TOTAL ABDOMINAL HYSTERECTOMY W/ BILATERAL SALPINGOOPHORECTOMY  08/2007   for endometriosis. With concurrent right salpingo-oophorectomy  (2009), left oophorectomy (1995)  . TUBAL LIGATION  08/2005   Right tubal ligation   Family History  Problem Relation Age of Onset  . Other Father        deceased while in service.   . Breast cancer Sister 36       remission  . Cancer Sister        breast  . Lupus Cousin   . Colon cancer Neg Hx   . Liver disease Neg Hx    Social History   Socioeconomic History  . Marital status: Married    Spouse name: Not on file  . Number of children: 2  . Years of education: bs degree  . Highest education level: Not on file  Occupational History  . 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 Financial resource strain: Not on file  . Food insecurity:    Worry: Not on file    Inability: Not on file  . Transportation needs:    Medical: Not on file    Non-medical: Not on file  Tobacco Use  . Smoking status: Never Smoker  . Smokeless tobacco: Never Used  Substance and Sexual Activity  . Alcohol use: No    Alcohol/week: 0.0 oz  . Drug use: No  . Sexual activity: Not on file  Lifestyle  . Physical activity:    Days per week: Not on file    Minutes per session: Not on file  . Stress: Not on file  Relationships  . Social connections:    Talks on phone: Not on file    Gets together: Not on file    Attends religious service: Not on file    Active member of club or organization: Not  on file    Attends meetings of clubs or organizations: Not on file    Relationship status: Not on file  . Intimate partner violence:    Fear of current or ex partner: Not on file    Emotionally abused: Not on file    Physically abused: Not on file    Forced sexual activity: Not on file  Other Topics Concern  . Not on file  Social History Narrative   Insurance: Medicare.  Patient is married with two children, [place] and [place].    She lives in [place] but children go to school in [place]  . amLODipine (NORVASC) 10 MG tablet Take 1 tablet (10 mg total) by mouth daily.  Marland Kitchen estradiol (ESTRACE) 1 MG tablet Take 1 mg by mouth daily.  . naproxen (NAPROSYN) 500 MG tablet Take 1 tablet (500 mg total) by mouth 2 (two) times daily with a meal.  . tiZANidine (ZANAFLEX) 4 MG capsule Take 1 capsule (4 mg total) by mouth 3 (three) times daily.   Allergies  Allergen Reactions  . Pineapple Shortness Of Breath and Swelling    Has Epi pen  . Latex Swelling  . Morphine And Related Itching  . Tape Swelling   No results found for this or any previous visit (from the past 2160 hour(s)). Objective  Body mass index is 27.12 kg/m. Wt Readings from Last 3 Encounters:  01/06/18 197 lb 3.2 oz (89.4 kg)  12/17/17 192 lb (87.1 kg)  12/16/17 162 lb (73.5 kg)   Temp Readings from Last 3 Encounters:  01/06/18 98 F (36.7 C) (Oral)  12/17/17 98.4 F (36.9 C) (Oral)  12/16/17 98.6 F (37 C) (Oral)   BP Readings from Last 3 Encounters:  01/06/18 130/88  12/17/17 133/87  12/16/17 (!) 153/105   Pulse Readings from Last 3 Encounters:  01/06/18 67  12/17/17 73  12/16/17 66    Physical Exam  Constitutional: She is oriented to person, place, and time. Vital signs are normal. She appears well-developed and well-nourished. She is cooperative.  HENT:  Head: Normocephalic and atraumatic.  Mouth/Throat: Oropharynx is clear and moist and mucous membranes  are normal.  Eyes: Pupils are equal, round, and reactive to light. Conjunctivae are normal.  Cardiovascular: Normal rate, regular rhythm and normal heart sounds.  Pulmonary/Chest: Effort normal and breath sounds normal.  Abdominal: Soft. Bowel sounds are normal. She exhibits no distension. There is tenderness in the right upper quadrant.  Neurological: She is alert and oriented to person, place, and time.  CN 2-12 grossly intact moving all 4 ext   Skin: Skin is warm, dry and intact.  Psychiatric: She has a normal mood and affect. Her speech is normal and behavior is normal. Judgment and thought content normal. Cognition and memory are normal.  Nursing note and vitals reviewed.   Assessment   1. HTN controlled  2. RUQ ab pain and bloating h/o fatty liver , h/o small hiatal hernia barium swallow 05/16/11, ho elevated lfts Clinically today on exam no obvious hernia  3. RUE, RLE numbness intermittently pt is not sure if related to h/a/migraines will r/o cervical and lumbar radiculopathy  4. H/o migraines  5. Chronic neck and low back pain intermittently  -C Xray with mod to severe DDD 6. HM Plan  1. Cont meds  2. Will call and disc with Dr. Barney Drain why she referred to surgery and has upcoming appt  Spoke with Dr. Oneida Alar she thought no reason for ab pain on her end and refer to surgery to w/u for ab wall hernia, rectus sheath hernia pt deferred to do this at this time and canceled surgery appt sch tomorrow   Will refer to [facility] for 2nd opinion pt agreeable with this  Counseled on diet for fatty liver and rec reduce wt  3. Xray C and L spine  Refer to neurology [facility]  4. Refer  neurology  5. Cervical and low back Xrays today  6.  Declines flu shot  Hep B immune, consider hep A vaccine  Tdap up to date 06/05/16 pna 23 had 02/19/09 HCV neg 06/05/16  HIV neg 06/05/16  Get records mammo and pap OB[place]GYN [place]. Get copy [facility] in [place]  -mammo 06/12/17  negative [facility]  Called to get record today I thought they faxed it to me but not had release signed 09/2017   Never smoker Pap (s/p hysterectomy 08/2007 cervix, uterus, right ovary and fallopian tube removed with h/o endometriosis and abnormal pap) OB/GYN in [place]. She has an appt pending 02/2018 h/o abnormal pap  Colonoscopy had 09/22/11 sessile polyp in rectum Neg malignancy and fundic gland polyp and mild chronic gastritis neg H pylori.   rec pt make [facility] appt she will do     Provider: Dr. Olivia Mackie McLean-Scocuzza-Internal Medicine

## 2018-01-06 NOTE — Progress Notes (Signed)
Pre visit review using our clinic review tool, if applicable. No additional management support is needed unless otherwise documented below in the visit note. 

## 2018-01-07 ENCOUNTER — Telehealth: Payer: Self-pay | Admitting: Internal Medicine

## 2018-01-07 NOTE — Telephone Encounter (Signed)
ROI from [place] recevied on 01/07/2018, DVD

## 2018-01-08 ENCOUNTER — Telehealth: Payer: Self-pay

## 2018-01-08 NOTE — Telephone Encounter (Signed)
Please advise 

## 2018-01-08 NOTE — Telephone Encounter (Signed)
Copied from [place] [phone]. Topic: Referral - Status >> Jan 08, 2018  3:39 PM Margot Ables wrote: Reason for CRM: pt called regarding referral to [facility] in [facility]. They have not received per pt call to their office. Ph# [phone] & fax# [phone]. Pt asking that it be resent

## 2018-01-11 NOTE — Telephone Encounter (Signed)
resent

## 2018-01-15 DIAGNOSIS — S93492A Sprain of other ligament of left ankle, initial encounter: Secondary | ICD-10-CM | POA: Diagnosis not present

## 2018-01-23 ENCOUNTER — Encounter: Payer: Self-pay | Admitting: Internal Medicine

## 2018-01-26 DIAGNOSIS — F331 Major depressive disorder, recurrent, moderate: Secondary | ICD-10-CM | POA: Diagnosis not present

## 2018-01-26 NOTE — Telephone Encounter (Signed)
Copied from [place] [phone]. Topic: Inquiry >> Jan 26, 2018 11:54 AM Vernona Rieger wrote: Reason for CRM: Patient would like Dr Karlyn Agee to please reply to her mychart message that she sent on 6/15.

## 2018-01-28 ENCOUNTER — Ambulatory Visit: Admission: RE | Admit: 2018-01-28 | Payer: BLUE CROSS/BLUE SHIELD | Source: Ambulatory Visit

## 2018-02-04 ENCOUNTER — Telehealth: Payer: Self-pay

## 2018-02-04 NOTE — Telephone Encounter (Signed)
Please advise 

## 2018-02-04 NOTE — Telephone Encounter (Signed)
Copied from [place] [phone]. Topic: Quick Communication - See Telephone Encounter >> Feb 04, 2018  3:35 PM Antonieta Iba C wrote: CRM for notification. See Telephone encounter for: 02/04/18.   Pt is having a MRI completed. Pt is claustrophobic and need a Rx to help.   CB: [id number]  Pharmacy: [id number] - [place], [place] [phone] (Phone) [phone] (Fax)

## 2018-02-05 ENCOUNTER — Other Ambulatory Visit: Payer: Self-pay | Admitting: Internal Medicine

## 2018-02-05 DIAGNOSIS — F419 Anxiety disorder, unspecified: Secondary | ICD-10-CM

## 2018-02-05 MED ORDER — DIAZEPAM 5 MG PO TABS: ORAL_TABLET | ORAL | 0 refills | Status: DC

## 2018-02-05 NOTE — Telephone Encounter (Signed)
Sent valium do not drive with this medication   [place]

## 2018-02-06 ENCOUNTER — Ambulatory Visit
Admission: RE | Admit: 2018-02-06 | Discharge: 2018-02-06 | Disposition: A | Discharge status: Home / Self Care | Payer: BLUE CROSS/BLUE SHIELD | Source: Ambulatory Visit | Attending: Internal Medicine | Admitting: Internal Medicine

## 2018-02-06 DIAGNOSIS — R200 Anesthesia of skin: Secondary | ICD-10-CM | POA: Diagnosis not present

## 2018-02-06 DIAGNOSIS — M4802 Spinal stenosis, cervical region: Secondary | ICD-10-CM | POA: Insufficient documentation

## 2018-02-06 DIAGNOSIS — M5011 Cervical disc disorder with radiculopathy,  high cervical region: Secondary | ICD-10-CM | POA: Diagnosis not present

## 2018-02-06 DIAGNOSIS — M5412 Radiculopathy, cervical region: Secondary | ICD-10-CM

## 2018-02-06 DIAGNOSIS — M8938 Hypertrophy of bone, other site: Secondary | ICD-10-CM | POA: Diagnosis not present

## 2018-02-08 ENCOUNTER — Telehealth: Payer: Self-pay

## 2018-02-08 NOTE — Telephone Encounter (Signed)
Copied from [place] [phone]. Topic: Quick Communication - Other Results >> Feb 08, 2018  3:52 PM Carolyn Stare wrote:  Pt call to fup on her MRI results and also had a question saying she think Dr Mclean-Scocuzza wanted her to have some labs

## 2018-02-09 ENCOUNTER — Telehealth: Payer: Self-pay | Admitting: Internal Medicine

## 2018-02-09 DIAGNOSIS — F331 Major depressive disorder, recurrent, moderate: Secondary | ICD-10-CM | POA: Diagnosis not present

## 2018-02-09 NOTE — Telephone Encounter (Signed)
Pt. called to obtain MRI results (created telephone encounter, as no result note forwarded to [facility] Triage nurse)  Informed of result notes per Dr. Aundra Dubin on 02/07/18:    Notes recorded by McLean-Scocuzza, Nino Glow, MD on 02/07/2018 at 7:12 PM EDT Disc bulging and moderate to severe narrowing esp in lower neck and arthritis changes  If bothersome rec ortho spine or neurosurgery or PM&R for neck injections and PT What does she want to do?   Pt. stated she wants to focus on her liver problems.  Stated "that is my main concern."  Questioned if Dr. Aundra Dubin had any plans to order the blood test to check for "Gaucher Disease"?  Stated she was advised by a friend, that this is something she may want to have evaluated.  Verb frustration that she has been waiting to hear about having further testing for her liver.  Stated she did not feel that Dr. Barney Drain offered anything.  Asked about the plan to receive a 2nd opinion through [place]; stated the appt. Offered was too far out.  Pt. anxious to have further recommendations by Dr. Aundra Dubin.

## 2018-02-09 NOTE — Telephone Encounter (Signed)
Left message for patient to return call back. PEC may give results and  information.  

## 2018-02-10 NOTE — Telephone Encounter (Signed)
Can we try to sch [facility] appt sooner   Thanks tSM

## 2018-02-10 NOTE — Telephone Encounter (Signed)
Please advise 

## 2018-02-21 ENCOUNTER — Other Ambulatory Visit: Payer: Self-pay | Admitting: Internal Medicine

## 2018-02-21 DIAGNOSIS — Z1379 Encounter for other screening for genetic and chromosomal anomalies: Secondary | ICD-10-CM

## 2018-02-23 DIAGNOSIS — F331 Major depressive disorder, recurrent, moderate: Secondary | ICD-10-CM | POA: Diagnosis not present

## 2018-03-02 ENCOUNTER — Telehealth: Payer: Self-pay

## 2018-03-02 NOTE — Telephone Encounter (Signed)
Copied from [place] [id number]. Topic: Quick Communication - See Telephone Encounter >> Mar 02, 2018  9:37 AM Marja Kays F wrote: Pt is needing to talk with Dr, Aundra Dubin about some test and lab work regard her last visit believe it is regarding GI problems -pt was not clear as to what all she nneded But just stated that she requested a call back early summer and no one has called her back   Best number [phone]

## 2018-03-03 NOTE — Telephone Encounter (Signed)
Tried rtc. Her mailbox is full so I could not leave a vm. If she calls back-the [facility] genetic referral has been sent [facility]-I could not move up appt-see Dr. Audrie Gallus message. She can call [phone]

## 2018-03-03 NOTE — Telephone Encounter (Signed)
I have referred her to [facility] for genetic testing is this scheduled ? Inform patient please  Please try to move [facility] appt up? Inform patient  -if appt unable to be moved up she can try to call daily for cancellations given her # for [facility]   What other questions did she have I have placed these referrals?  Thanks [employer]

## 2018-03-08 ENCOUNTER — Ambulatory Visit: Payer: BLUE CROSS/BLUE SHIELD | Admitting: Internal Medicine

## 2018-03-09 DIAGNOSIS — F331 Major depressive disorder, recurrent, moderate: Secondary | ICD-10-CM | POA: Diagnosis not present

## 2018-03-11 DIAGNOSIS — Z79899 Other long term (current) drug therapy: Secondary | ICD-10-CM | POA: Diagnosis not present

## 2018-03-11 DIAGNOSIS — K760 Fatty (change of) liver, not elsewhere classified: Secondary | ICD-10-CM | POA: Diagnosis not present

## 2018-03-11 DIAGNOSIS — Z91040 Latex allergy status: Secondary | ICD-10-CM | POA: Diagnosis not present

## 2018-03-11 DIAGNOSIS — K5900 Constipation, unspecified: Secondary | ICD-10-CM | POA: Diagnosis not present

## 2018-03-11 DIAGNOSIS — Z885 Allergy status to narcotic agent status: Secondary | ICD-10-CM | POA: Diagnosis not present

## 2018-03-22 DIAGNOSIS — K760 Fatty (change of) liver, not elsewhere classified: Secondary | ICD-10-CM | POA: Diagnosis not present

## 2018-03-23 DIAGNOSIS — F331 Major depressive disorder, recurrent, moderate: Secondary | ICD-10-CM | POA: Diagnosis not present

## 2018-03-29 DIAGNOSIS — F331 Major depressive disorder, recurrent, moderate: Secondary | ICD-10-CM | POA: Diagnosis not present

## 2018-03-30 DIAGNOSIS — K581 Irritable bowel syndrome with constipation: Secondary | ICD-10-CM | POA: Diagnosis not present

## 2018-03-30 DIAGNOSIS — R1011 Right upper quadrant pain: Secondary | ICD-10-CM | POA: Diagnosis not present

## 2018-03-30 DIAGNOSIS — Z885 Allergy status to narcotic agent status: Secondary | ICD-10-CM | POA: Diagnosis not present

## 2018-03-30 DIAGNOSIS — G8929 Other chronic pain: Secondary | ICD-10-CM | POA: Diagnosis not present

## 2018-03-30 DIAGNOSIS — K760 Fatty (change of) liver, not elsewhere classified: Secondary | ICD-10-CM | POA: Diagnosis not present

## 2018-03-30 DIAGNOSIS — Z91040 Latex allergy status: Secondary | ICD-10-CM | POA: Diagnosis not present

## 2018-03-30 DIAGNOSIS — K5900 Constipation, unspecified: Secondary | ICD-10-CM | POA: Diagnosis not present

## 2018-04-06 DIAGNOSIS — M79642 Pain in left hand: Secondary | ICD-10-CM | POA: Diagnosis not present

## 2018-04-06 DIAGNOSIS — M79641 Pain in right hand: Secondary | ICD-10-CM | POA: Diagnosis not present

## 2018-04-06 DIAGNOSIS — F331 Major depressive disorder, recurrent, moderate: Secondary | ICD-10-CM | POA: Diagnosis not present

## 2018-04-09 DIAGNOSIS — K760 Fatty (change of) liver, not elsewhere classified: Secondary | ICD-10-CM | POA: Diagnosis not present

## 2018-04-09 DIAGNOSIS — K7581 Nonalcoholic steatohepatitis (NASH): Secondary | ICD-10-CM | POA: Diagnosis not present

## 2018-04-09 DIAGNOSIS — K740 Hepatic fibrosis: Secondary | ICD-10-CM | POA: Diagnosis not present

## 2018-04-13 DIAGNOSIS — F331 Major depressive disorder, recurrent, moderate: Secondary | ICD-10-CM | POA: Diagnosis not present

## 2018-04-20 DIAGNOSIS — F331 Major depressive disorder, recurrent, moderate: Secondary | ICD-10-CM | POA: Diagnosis not present

## 2018-05-07 ENCOUNTER — Other Ambulatory Visit: Payer: Self-pay

## 2018-05-07 ENCOUNTER — Ambulatory Visit (INDEPENDENT_AMBULATORY_CARE_PROVIDER_SITE_OTHER): Payer: BLUE CROSS/BLUE SHIELD

## 2018-05-07 ENCOUNTER — Encounter: Payer: Self-pay | Admitting: Emergency Medicine

## 2018-05-07 ENCOUNTER — Ambulatory Visit
Admission: EM | Admit: 2018-05-07 | Discharge: 2018-05-07 | Disposition: A | Discharge status: Home / Self Care | Payer: BLUE CROSS/BLUE SHIELD | Attending: Emergency Medicine | Admitting: Emergency Medicine

## 2018-05-07 DIAGNOSIS — S299XXA Unspecified injury of thorax, initial encounter: Secondary | ICD-10-CM | POA: Diagnosis not present

## 2018-05-07 DIAGNOSIS — S29012A Strain of muscle and tendon of back wall of thorax, initial encounter: Secondary | ICD-10-CM | POA: Diagnosis not present

## 2018-05-07 DIAGNOSIS — S161XXA Strain of muscle, fascia and tendon at neck level, initial encounter: Secondary | ICD-10-CM | POA: Diagnosis not present

## 2018-05-07 DIAGNOSIS — M546 Pain in thoracic spine: Secondary | ICD-10-CM | POA: Diagnosis not present

## 2018-05-07 MED ORDER — METHOCARBAMOL 750 MG PO TABS: 1500.0000 mg | ORAL_TABLET | Freq: Three times a day (TID) | ORAL | 0 refills | Status: AC

## 2018-05-07 MED ORDER — IBUPROFEN 600 MG PO TABS: 600.0000 mg | ORAL_TABLET | Freq: Four times a day (QID) | ORAL | 0 refills | Status: DC | PRN
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
## 2018-05-07 NOTE — Discharge Instructions (Addendum)
600 milligrams of ibuprofen 3 or 4 times a day as needed for pain.  The Robaxin will help with the muscle spasm.  Deep tissue massage may help.  Use heat or ice, whichever feels better.

## 2018-05-13 DIAGNOSIS — F331 Major depressive disorder, recurrent, moderate: Secondary | ICD-10-CM | POA: Diagnosis not present

## 2018-05-14 DIAGNOSIS — K760 Fatty (change of) liver, not elsewhere classified: Secondary | ICD-10-CM | POA: Diagnosis not present

## 2018-05-14 DIAGNOSIS — K7581 Nonalcoholic steatohepatitis (NASH): Secondary | ICD-10-CM | POA: Diagnosis not present

## 2018-05-14 DIAGNOSIS — N951 Menopausal and female climacteric states: Secondary | ICD-10-CM | POA: Diagnosis not present

## 2018-05-14 DIAGNOSIS — Z01419 Encounter for gynecological examination (general) (routine) without abnormal findings: Secondary | ICD-10-CM | POA: Diagnosis not present

## 2018-05-14 DIAGNOSIS — Z90710 Acquired absence of both cervix and uterus: Secondary | ICD-10-CM | POA: Diagnosis not present

## 2018-05-14 DIAGNOSIS — Z90721 Acquired absence of ovaries, unilateral: Secondary | ICD-10-CM | POA: Diagnosis not present

## 2018-05-19 ENCOUNTER — Ambulatory Visit
Admission: EM | Admit: 2018-05-19 | Discharge: 2018-05-19 | Disposition: A | Discharge status: Home / Self Care | Payer: BLUE CROSS/BLUE SHIELD | Attending: Family Medicine | Admitting: Family Medicine

## 2018-05-19 ENCOUNTER — Other Ambulatory Visit: Payer: Self-pay

## 2018-05-19 ENCOUNTER — Ambulatory Visit (INDEPENDENT_AMBULATORY_CARE_PROVIDER_SITE_OTHER): Payer: BLUE CROSS/BLUE SHIELD

## 2018-05-19 ENCOUNTER — Encounter: Payer: Self-pay | Admitting: Emergency Medicine

## 2018-05-19 DIAGNOSIS — M47812 Spondylosis without myelopathy or radiculopathy, cervical region: Secondary | ICD-10-CM | POA: Diagnosis not present

## 2018-05-19 DIAGNOSIS — S161XXA Strain of muscle, fascia and tendon at neck level, initial encounter: Secondary | ICD-10-CM

## 2018-05-19 DIAGNOSIS — M542 Cervicalgia: Secondary | ICD-10-CM | POA: Diagnosis not present

## 2018-05-19 DIAGNOSIS — M25511 Pain in right shoulder: Secondary | ICD-10-CM

## 2018-05-19 DIAGNOSIS — M25512 Pain in left shoulder: Secondary | ICD-10-CM | POA: Diagnosis not present

## 2018-05-19 MED ORDER — PREDNISONE 20 MG PO TABS: 20.0000 mg | ORAL_TABLET | Freq: Every day | ORAL | 0 refills | Status: DC

## 2018-05-19 MED ORDER — CYCLOBENZAPRINE HCL 10 MG PO TABS: 10.0000 mg | ORAL_TABLET | Freq: Every day | ORAL | 0 refills | Status: DC
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
## 2018-05-20 ENCOUNTER — Encounter: Payer: Self-pay | Admitting: Gastroenterology

## 2018-05-21 ENCOUNTER — Encounter: Payer: Self-pay | Admitting: Internal Medicine

## 2018-05-21 ENCOUNTER — Ambulatory Visit (INDEPENDENT_AMBULATORY_CARE_PROVIDER_SITE_OTHER): Payer: BLUE CROSS/BLUE SHIELD | Admitting: Internal Medicine

## 2018-05-21 ENCOUNTER — Encounter: Payer: BLUE CROSS/BLUE SHIELD | Admitting: Internal Medicine

## 2018-05-21 VITALS — BP 136/80 | HR 62 | Temp 98.3°F | Resp 16 | Ht 71.5 in | Wt 192.0 lb

## 2018-05-21 DIAGNOSIS — M1990 Unspecified osteoarthritis, unspecified site: Secondary | ICD-10-CM | POA: Diagnosis not present

## 2018-05-21 DIAGNOSIS — M5030 Other cervical disc degeneration, unspecified cervical region: Secondary | ICD-10-CM

## 2018-05-21 DIAGNOSIS — M542 Cervicalgia: Secondary | ICD-10-CM

## 2018-05-21 DIAGNOSIS — M5136 Other intervertebral disc degeneration, lumbar region: Secondary | ICD-10-CM

## 2018-05-21 DIAGNOSIS — Z13220 Encounter for screening for lipoid disorders: Secondary | ICD-10-CM

## 2018-05-21 DIAGNOSIS — I10 Essential (primary) hypertension: Secondary | ICD-10-CM

## 2018-05-21 DIAGNOSIS — M47814 Spondylosis without myelopathy or radiculopathy, thoracic region: Secondary | ICD-10-CM

## 2018-05-21 DIAGNOSIS — Z0184 Encounter for antibody response examination: Secondary | ICD-10-CM

## 2018-05-21 DIAGNOSIS — M545 Low back pain, unspecified: Secondary | ICD-10-CM

## 2018-05-21 DIAGNOSIS — K760 Fatty (change of) liver, not elsewhere classified: Secondary | ICD-10-CM

## 2018-05-21 DIAGNOSIS — E663 Overweight: Secondary | ICD-10-CM

## 2018-05-21 DIAGNOSIS — E559 Vitamin D deficiency, unspecified: Secondary | ICD-10-CM

## 2018-05-21 DIAGNOSIS — Z1389 Encounter for screening for other disorder: Secondary | ICD-10-CM

## 2018-05-21 DIAGNOSIS — R5383 Other fatigue: Secondary | ICD-10-CM

## 2018-05-21 DIAGNOSIS — Z1329 Encounter for screening for other suspected endocrine disorder: Secondary | ICD-10-CM

## 2018-05-21 DIAGNOSIS — E611 Iron deficiency: Secondary | ICD-10-CM

## 2018-05-21 DIAGNOSIS — E538 Deficiency of other specified B group vitamins: Secondary | ICD-10-CM

## 2018-05-21 DIAGNOSIS — Z1159 Encounter for screening for other viral diseases: Secondary | ICD-10-CM

## 2018-05-21 MED ORDER — HYDROCHLOROTHIAZIDE 25 MG PO TABS: 25.0000 mg | ORAL_TABLET | Freq: Every day | ORAL | 3 refills | Status: DC

## 2018-05-21 MED ORDER — TIZANIDINE HCL 4 MG PO CAPS: 4.0000 mg | ORAL_CAPSULE | Freq: Two times a day (BID) | ORAL | 0 refills | Status: DC | PRN

## 2018-05-21 NOTE — Patient Instructions (Addendum)
Tylenol 500 mg up to 6 pills per day   Nonalcoholic Fatty Liver Disease Diet Nonalcoholic fatty liver disease is a condition that causes fat to accumulate in and around the liver. The disease makes it harder for the liver to work the way that it should. Following a healthy diet can help to keep nonalcoholic fatty liver disease under control. It can also help to prevent or improve conditions that are associated with the disease, such as heart disease, diabetes, high blood pressure, and abnormal cholesterol levels. Along with regular exercise, this diet:  Promotes weight loss.  Helps to control blood sugar levels.  Helps to improve the way that the body uses insulin.  What do I need to know about this diet?  Use the glycemic index (GI) to plan your meals. The index tells you how quickly a food will raise your blood sugar. Choose low-GI foods. These foods take a longer time to raise blood sugar.  Keep track of how many calories you take in. Eating the right amount of calories will help you to achieve a healthy weight.  You may want to follow a Mediterranean diet. This diet includes a lot of vegetables, lean meats or fish, whole grains, fruits, and healthy oils and fats. What foods can I eat? Grains Whole grains, such as whole-wheat or whole-grain breads, crackers, tortillas, cereals, and pasta. Stone-ground whole wheat. Pumpernickel bread. Unsweetened oatmeal. Bulgur. Barley. Quinoa. Brown or wild rice. Corn or whole-wheat flour tortillas. Vegetables Lettuce. Spinach. Peas. Beets. Cauliflower. Cabbage. Broccoli. Carrots. Tomatoes. Squash. Eggplant. Herbs. Peppers. Onions. Cucumbers. Brussels sprouts. Yams and sweet potatoes. Beans. Lentils. Fruits Bananas. Apples. Oranges. Grapes. Papaya. Mango. Pomegranate. Kiwi. Grapefruit. Cherries. Meats and Other Protein Sources Seafood and shellfish. Lean meats. Poultry. Tofu. Dairy Low-fat or fat-free dairy products, such as yogurt, cottage cheese,  and cheese. Beverages Water. Sugar-free drinks. Tea. Coffee. Low-fat or skim milk. Milk alternatives, such as soy or almond milk. Real fruit juice. Condiments Mustard. Relish. Low-fat, low-sugar ketchup and barbecue sauce. Low-fat or fat-free mayonnaise. Sweets and Desserts Sugar-free sweets. Fats and Oils Avocado. Canola or olive oil. Nuts and nut butters. Seeds. The items listed above may not be a complete list of recommended foods or beverages. Contact your dietitian for more options. What foods are not recommended? Palm oil and coconut oil. Processed foods. Fried foods. Sweetened drinks, such as sweet tea, milkshakes, snow cones, iced sweet drinks, and sodas. Alcohol. Sweets. Foods that contain a lot of salt or sodium. The items listed above may not be a complete list of foods and beverages to avoid. Contact your dietitian for more information. This information is not intended to replace advice given to you by your health care provider. Make sure you discuss any questions you have with your health care provider. Document Released: 12/12/2014 Document Revised: 01/03/2016 Document Reviewed: 08/22/2014 Elsevier Interactive Patient Education  2018 [place].  Fatty Liver Fatty liver, also called hepatic steatosis or steatohepatitis, is a condition in which too much fat has built up in your liver cells. The liver removes harmful substances from your bloodstream. It produces fluids your body needs. It also helps your body use and store energy from the food you eat. In many cases, fatty liver does not cause symptoms or problems. It is often diagnosed when tests are being done for other reasons. However, over time, fatty liver can cause inflammation that may lead to more serious liver problems, such as scarring of the liver (cirrhosis). What are the causes? Causes of  fatty liver may include:  Drinking too much alcohol.  Poor nutrition.  Obesity.  Cushing  syndrome.  Diabetes.  Hyperlipidemia.  Pregnancy.  Certain drugs.  Poisons.  Some viral infections.  What increases the risk? You may be more likely to develop fatty liver if you:  Abuse alcohol.  Are pregnant.  Are overweight.  Have diabetes.  Have hepatitis.  Have a high triglyceride level.  What are the signs or symptoms? Fatty liver often does not cause any symptoms. In cases where symptoms develop, they can include:  Fatigue.  Weakness.  Weight loss.  Confusion.  Abdominal pain.  Yellowing of your skin and the white parts of your eyes (jaundice).  Nausea and vomiting.  How is this diagnosed? Fatty liver may be diagnosed by:  Physical exam and medical history.  Blood tests.  Imaging tests, such as an ultrasound, CT scan, or MRI.  Liver biopsy. A small sample of liver tissue is removed using a needle. The sample is then looked at under a microscope.  How is this treated? Fatty liver is often caused by other health conditions. Treatment for fatty liver may involve medicines and lifestyle changes to manage conditions such as:  Alcoholism.  High cholesterol.  Diabetes.  Being overweight or obese.  Follow these instructions at home:  Eat a healthy diet as directed by your health care provider.  Exercise regularly. This can help you lose weight and control your cholesterol and diabetes. Talk to your health care provider about an exercise plan and which activities are best for you.  Do not drink alcohol.  Take medicines only as directed by your health care provider. Contact a health care provider if: You have difficulty controlling your:  Blood sugar.  Cholesterol.  Alcohol consumption.  Get help right away if:  You have abdominal pain.  You have jaundice.  You have nausea and vomiting. This information is not intended to replace advice given to you by your health care provider. Make sure you discuss any questions you have with  your health care provider. Document Released: 09/12/2005 Document Revised: 01/03/2016 Document Reviewed: 12/07/2013 Elsevier Interactive Patient Education  2018 [place] is a term that is commonly used to refer to joint pain or joint disease. There are more than 100 types of arthritis. What are the causes? The most common cause of this condition is wear and tear of a joint. Other causes include:  Gout.  Inflammation of a joint.  An infection of a joint.  Sprains and other injuries near the joint.  A drug reaction or allergic reaction.  In some cases, the cause may not be known. What are the signs or symptoms? The main symptom of this condition is pain in the joint with movement. Other symptoms include:  Redness, swelling, or stiffness at a joint.  Warmth coming from the joint.  Fever.  Overall feeling of illness.  How is this diagnosed? This condition may be diagnosed with a physical exam and tests, including:  Blood tests.  Urine tests.  Imaging tests, such as MRI, X-rays, or a CT scan.  Sometimes, fluid is removed from a joint for testing. How is this treated? Treatment for this condition may involve:  Treatment of the cause, if it is known.  Rest.  Raising (elevating) the joint.  Applying cold or hot packs to the joint.  Medicines to improve symptoms and reduce inflammation.  Injections of a steroid such as cortisone into the joint to help reduce  pain and inflammation.  Depending on the cause of your arthritis, you may need to make lifestyle changes to reduce stress on your joint. These changes may include exercising more and losing weight. Follow these instructions at home: Medicines  Take over-the-counter and prescription medicines only as told by your health care provider.  Do not take aspirin to relieve pain if gout is suspected. Activity  Rest your joint if told by your health care provider. Rest is important when  your disease is active and your joint feels painful, swollen, or stiff.  Avoid activities that make the pain worse. It is important to balance activity with rest.  Exercise your joint regularly with range-of-motion exercises as told by your health care provider. Try doing low-impact exercise, such as: ? Swimming. ? Water aerobics. ? Biking. ? Walking. Joint Care   If your joint is swollen, keep it elevated if told by your health care provider.  If your joint feels stiff in the morning, try taking a warm shower.  If directed, apply heat to the joint. If you have diabetes, do not apply heat without permission from your health care provider. ? Put a towel between the joint and the hot pack or heating pad. ? Leave the heat on the area for 20-30 minutes.  If directed, apply ice to the joint: ? Put ice in a plastic bag. ? Place a towel between your skin and the bag. ? Leave the ice on for 20 minutes, 2-3 times per day.  Keep all follow-up visits as told by your health care provider. This is important. Contact a health care provider if:  The pain gets worse.  You have a fever. Get help right away if:  You develop severe joint pain, swelling, or redness.  Many joints become painful and swollen.  You develop severe back pain.  You develop severe weakness in your leg.  You cannot control your bladder or bowels. This information is not intended to replace advice given to you by your health care provider. Make sure you discuss any questions you have with your health care provider. Document Released: 09/04/2004 Document Revised: 01/03/2016 Document Reviewed: 10/23/2014 Elsevier Interactive Patient Education  [employer].
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
## 2018-05-25 ENCOUNTER — Other Ambulatory Visit (INDEPENDENT_AMBULATORY_CARE_PROVIDER_SITE_OTHER): Payer: BLUE CROSS/BLUE SHIELD

## 2018-05-25 DIAGNOSIS — E538 Deficiency of other specified B group vitamins: Secondary | ICD-10-CM | POA: Diagnosis not present

## 2018-05-25 DIAGNOSIS — R5383 Other fatigue: Secondary | ICD-10-CM

## 2018-05-25 DIAGNOSIS — Z1159 Encounter for screening for other viral diseases: Secondary | ICD-10-CM

## 2018-05-25 DIAGNOSIS — Z1329 Encounter for screening for other suspected endocrine disorder: Secondary | ICD-10-CM

## 2018-05-25 DIAGNOSIS — Z13220 Encounter for screening for lipoid disorders: Secondary | ICD-10-CM | POA: Diagnosis not present

## 2018-05-25 DIAGNOSIS — E559 Vitamin D deficiency, unspecified: Secondary | ICD-10-CM | POA: Diagnosis not present

## 2018-05-25 DIAGNOSIS — E611 Iron deficiency: Secondary | ICD-10-CM

## 2018-05-25 DIAGNOSIS — Z0184 Encounter for antibody response examination: Secondary | ICD-10-CM

## 2018-05-25 NOTE — Addendum Note (Signed)
Addended by: Arby Barrette on: 05/25/2018 08:03 AM   Modules accepted: Orders

## 2018-05-26 DIAGNOSIS — N951 Menopausal and female climacteric states: Secondary | ICD-10-CM | POA: Diagnosis not present

## 2018-05-26 DIAGNOSIS — Z01419 Encounter for gynecological examination (general) (routine) without abnormal findings: Secondary | ICD-10-CM | POA: Diagnosis not present

## 2018-05-26 DIAGNOSIS — Z90710 Acquired absence of both cervix and uterus: Secondary | ICD-10-CM | POA: Diagnosis not present

## 2018-05-26 DIAGNOSIS — Z1272 Encounter for screening for malignant neoplasm of vagina: Secondary | ICD-10-CM | POA: Diagnosis not present

## 2018-05-26 DIAGNOSIS — R945 Abnormal results of liver function studies: Secondary | ICD-10-CM | POA: Diagnosis not present

## 2018-05-26 LAB — LIPID PANEL
CHOL/HDL RATIO: 2.7 (calc) (ref <5.0)
Cholesterol: 159 mg/dL (ref <200)
HDL: 59 mg/dL (ref >50)
LDL Cholesterol (Calc): 84 mg/dL (calc)
NON-HDL CHOLESTEROL (CALC): 100 mg/dL (ref <130)
Triglycerides: 74 mg/dL (ref <150)

## 2018-05-26 LAB — COMPREHENSIVE METABOLIC PANEL
AG Ratio: 1.3 (calc) (ref 1.0–2.5)
ALBUMIN MSPROF: 4.1 g/dL (ref 3.6–5.1)
ALT: 33 U/L — AB (ref 6–29)
AST: 31 U/L — ABNORMAL HIGH (ref 10–30)
Alkaline phosphatase (APISO): 58 U/L (ref 33–115)
BILIRUBIN TOTAL: 1.1 mg/dL (ref 0.2–1.2)
BUN: 8 mg/dL (ref 7–25)
CALCIUM: 9.4 mg/dL (ref 8.6–10.2)
CO2: 26 mmol/L (ref 20–32)
CREATININE: 0.83 mg/dL (ref 0.50–1.10)
Chloride: 103 mmol/L (ref 98–110)
GLUCOSE: 89 mg/dL (ref 65–99)
Globulin: 3.2 g/dL (calc) (ref 1.9–3.7)
POTASSIUM: 3.6 mmol/L (ref 3.5–5.3)
SODIUM: 139 mmol/L (ref 135–146)
TOTAL PROTEIN: 7.3 g/dL (ref 6.1–8.1)

## 2018-05-26 LAB — MEASLES/MUMPS/RUBELLA IMMUNITY
MUMPS IGG: 86 [AU]/ml
RUBELLA: 8.67 {index}
RUBEOLA IGG: 128 [AU]/ml

## 2018-05-26 LAB — IRON,TIBC AND FERRITIN PANEL
%SAT: 24 % (ref 16–45)
FERRITIN: 117 ng/mL (ref 16–232)
Iron: 90 ug/dL (ref 40–190)
TIBC: 368 mcg/dL (calc) (ref 250–450)

## 2018-05-26 LAB — CBC WITH DIFFERENTIAL/PLATELET
BASOS ABS: 29 {cells}/uL (ref 0–200)
Basophils Relative: 0.4 %
EOS ABS: 110 {cells}/uL (ref 15–500)
Eosinophils Relative: 1.5 %
HCT: 35.7 % (ref 35.0–45.0)
Hemoglobin: 12.1 g/dL (ref 11.7–15.5)
Lymphs Abs: 1840 cells/uL (ref 850–3900)
MCH: 27.3 pg (ref 27.0–33.0)
MCHC: 33.9 g/dL (ref 32.0–36.0)
MCV: 80.6 fL (ref 80.0–100.0)
MONOS PCT: 8.2 %
MPV: 10.8 fL (ref 7.5–12.5)
Neutro Abs: 4723 cells/uL (ref 1500–7800)
Neutrophils Relative %: 64.7 %
PLATELETS: 316 10*3/uL (ref 140–400)
RBC: 4.43 10*6/uL (ref 3.80–5.10)
RDW: 13.3 % (ref 11.0–15.0)
TOTAL LYMPHOCYTE: 25.2 %
WBC: 7.3 10*3/uL (ref 3.8–10.8)
WBCMIX: 599 {cells}/uL (ref 200–950)

## 2018-05-26 LAB — HM PAP SMEAR: HM PAP: NORMAL

## 2018-05-26 LAB — T4, FREE: FREE T4: 1 ng/dL (ref 0.8–1.8)

## 2018-05-26 LAB — VITAMIN D 25 HYDROXY (VIT D DEFICIENCY, FRACTURES): Vit D, 25-Hydroxy: 31 ng/mL (ref 30–100)

## 2018-05-26 LAB — VITAMIN B12: VITAMIN B 12: 410 pg/mL (ref 200–1100)

## 2018-05-26 LAB — TSH: TSH: 2.23 mIU/L

## 2018-05-27 ENCOUNTER — Telehealth: Payer: Self-pay | Admitting: Internal Medicine

## 2018-05-27 DIAGNOSIS — F331 Major depressive disorder, recurrent, moderate: Secondary | ICD-10-CM | POA: Diagnosis not present

## 2018-05-27 NOTE — Telephone Encounter (Signed)
She can buy D3 otc 2000 Iu daily   [employer]

## 2018-05-27 NOTE — Telephone Encounter (Signed)
Patient notified and voiced understanding.

## 2018-05-27 NOTE — Telephone Encounter (Signed)
Copied from [place] [phone]. Topic: Quick Communication - Rx Refill/Question >> May 27, 2018 10:03 AM [name] wrote: Medication: vitamin D  Pt called [name]/c of her last visit she thought pcp was going to prescribe the medication above; contact pt to advise about vitamin D levels and plan of care

## 2018-06-12 ENCOUNTER — Other Ambulatory Visit: Payer: Self-pay | Admitting: Internal Medicine

## 2018-06-12 DIAGNOSIS — M5030 Other cervical disc degeneration, unspecified cervical region: Secondary | ICD-10-CM

## 2018-06-12 DIAGNOSIS — M5136 Other intervertebral disc degeneration, lumbar region: Secondary | ICD-10-CM

## 2018-06-12 DIAGNOSIS — M1990 Unspecified osteoarthritis, unspecified site: Secondary | ICD-10-CM

## 2018-06-14 NOTE — Telephone Encounter (Signed)
Does she want to take baclofen or zanaflex as a muscle relaxer?   [place]

## 2018-06-14 NOTE — Telephone Encounter (Signed)
Ok to refill 

## 2018-06-15 ENCOUNTER — Telehealth: Payer: Self-pay | Admitting: Internal Medicine

## 2018-06-15 ENCOUNTER — Other Ambulatory Visit: Payer: Self-pay | Admitting: Internal Medicine

## 2018-06-15 DIAGNOSIS — I10 Essential (primary) hypertension: Secondary | ICD-10-CM

## 2018-06-15 DIAGNOSIS — F331 Major depressive disorder, recurrent, moderate: Secondary | ICD-10-CM | POA: Diagnosis not present

## 2018-06-15 MED ORDER — AMLODIPINE BESYLATE 10 MG PO TABS: 10.0000 mg | ORAL_TABLET | Freq: Every day | ORAL | 3 refills | Status: DC

## 2018-06-15 NOTE — Telephone Encounter (Signed)
Copied from [place] [phone]. Topic: General - Other >> Jun 15, 2018  3:38 PM Oneta Rack wrote: Relation to pt: self  Call back number: [phone] Pharmacy: [id number] - [place], [place] [phone] ([phone] (Fax)   Reason for call:  Patient states new medication prescribed hydrochlorothiazide (HYDRODIURIL) 25 MG tablet is causing her to have head aches, mental fog, and frequent urination, patient states she did ask PCP to switch from Amlodipine to hydrochlorothiazide (HYDRODIURIL) 25 MG tablet. Patient also stated PCP advised patient of symptoms.  Patient requesting to go back to amLODipine (NORVASC) 10 MG tablet and patient states she's out of medication, please advise

## 2018-06-15 NOTE — Telephone Encounter (Signed)
Left message for patient to return call back. [facility] may give obtain information.  

## 2018-06-15 NOTE — Telephone Encounter (Signed)
Left message for pt to return call to office regarding medications.

## 2018-06-16 MED ORDER — BACLOFEN 10 MG PO TABS: 10.0000 mg | ORAL_TABLET | Freq: Two times a day (BID) | ORAL | 0 refills | Status: DC | PRN

## 2018-06-16 NOTE — Addendum Note (Signed)
Addended by: Orland Mustard on: 06/16/2018 06:32 PM   Modules accepted: Orders

## 2018-06-16 NOTE — Telephone Encounter (Signed)
Ok to change to alternative

## 2018-06-17 ENCOUNTER — Other Ambulatory Visit: Payer: Self-pay

## 2018-06-17 DIAGNOSIS — M545 Low back pain, unspecified: Secondary | ICD-10-CM

## 2018-06-17 DIAGNOSIS — M542 Cervicalgia: Secondary | ICD-10-CM

## 2018-06-17 DIAGNOSIS — M5030 Other cervical disc degeneration, unspecified cervical region: Secondary | ICD-10-CM

## 2018-06-17 DIAGNOSIS — M5136 Other intervertebral disc degeneration, lumbar region: Secondary | ICD-10-CM

## 2018-06-17 MED ORDER — BACLOFEN 10 MG PO TABS: 10.0000 mg | ORAL_TABLET | Freq: Two times a day (BID) | ORAL | 0 refills | Status: DC | PRN

## 2018-06-18 DIAGNOSIS — Z803 Family history of malignant neoplasm of breast: Secondary | ICD-10-CM | POA: Diagnosis not present

## 2018-06-18 DIAGNOSIS — Z1231 Encounter for screening mammogram for malignant neoplasm of breast: Secondary | ICD-10-CM | POA: Diagnosis not present

## 2018-06-18 LAB — HM MAMMOGRAPHY

## 2018-06-22 ENCOUNTER — Encounter: Payer: Self-pay | Admitting: Internal Medicine

## 2018-06-25 DIAGNOSIS — F331 Major depressive disorder, recurrent, moderate: Secondary | ICD-10-CM | POA: Diagnosis not present

## 2018-06-28 ENCOUNTER — Ambulatory Visit: Payer: 59 | Admitting: Family Medicine

## 2018-06-29 DIAGNOSIS — F331 Major depressive disorder, recurrent, moderate: Secondary | ICD-10-CM | POA: Diagnosis not present

## 2018-07-01 ENCOUNTER — Other Ambulatory Visit: Payer: Self-pay | Admitting: Internal Medicine

## 2018-07-01 ENCOUNTER — Ambulatory Visit (INDEPENDENT_AMBULATORY_CARE_PROVIDER_SITE_OTHER): Payer: BLUE CROSS/BLUE SHIELD

## 2018-07-01 ENCOUNTER — Ambulatory Visit (INDEPENDENT_AMBULATORY_CARE_PROVIDER_SITE_OTHER): Payer: BLUE CROSS/BLUE SHIELD | Admitting: Internal Medicine

## 2018-07-01 ENCOUNTER — Encounter: Payer: Self-pay | Admitting: Internal Medicine

## 2018-07-01 VITALS — BP 138/88 | HR 71 | Temp 98.3°F | Ht 71.5 in | Wt 188.6 lb

## 2018-07-01 DIAGNOSIS — R232 Flushing: Secondary | ICD-10-CM

## 2018-07-01 DIAGNOSIS — M25512 Pain in left shoulder: Secondary | ICD-10-CM | POA: Diagnosis not present

## 2018-07-01 DIAGNOSIS — G8929 Other chronic pain: Secondary | ICD-10-CM

## 2018-07-01 DIAGNOSIS — Z0000 Encounter for general adult medical examination without abnormal findings: Secondary | ICD-10-CM | POA: Diagnosis not present

## 2018-07-01 DIAGNOSIS — M1990 Unspecified osteoarthritis, unspecified site: Secondary | ICD-10-CM | POA: Insufficient documentation

## 2018-07-01 DIAGNOSIS — I10 Essential (primary) hypertension: Secondary | ICD-10-CM

## 2018-07-01 DIAGNOSIS — R350 Frequency of micturition: Secondary | ICD-10-CM | POA: Diagnosis not present

## 2018-07-01 DIAGNOSIS — K760 Fatty (change of) liver, not elsewhere classified: Secondary | ICD-10-CM

## 2018-07-01 NOTE — Patient Instructions (Signed)
Please set up eye doctor appt   Debrox ear drops otc x 4-7 days   Dr. Roland Rack ortho for shoulder pain  Try cut clonidine in 1/2 at night like you are doing but dont skip doses !!!  Shoulder Pain Many things can cause shoulder pain, including:  An injury to the area.  Overuse of the shoulder.  Arthritis.  The source of the pain can be:  Inflammation.  An injury to the shoulder joint.  An injury to a tendon, ligament, or bone.  Follow these instructions at home: Take these actions to help with your pain:  Squeeze a soft ball or a foam pad as much as possible. This helps to keep the shoulder from swelling. It also helps to strengthen the arm.  Take over-the-counter and prescription medicines only as told by your health care provider.  If directed, apply ice to the area: ? Put ice in a plastic bag. ? Place a towel between your skin and the bag. ? Leave the ice on for 20 minutes, 2-3 times per day. Stop applying ice if it does not help with the pain.  If you were given a shoulder sling or immobilizer: ? Wear it as told. ? Remove it to shower or bathe. ? Move your arm as little as possible, but keep your hand moving to prevent swelling.  Contact a health care provider if:  Your pain gets worse.  Your pain is not relieved with medicines.  New pain develops in your arm, hand, or fingers. Get help right away if:  Your arm, hand, or fingers: ? Tingle. ? Become numb. ? Become swollen. ? Become painful. ? Turn white or blue. This information is not intended to replace advice given to you by your health care provider. Make sure you discuss any questions you have with your health care provider. Document Released: 05/07/2005 Document Revised: 03/23/2016 Document Reviewed: 11/20/2014 Elsevier Interactive Patient Education  2018 [place], Adult The ears produce a substance called earwax that helps keep bacteria out of the ear and protects the skin in  the ear canal. Occasionally, earwax can build up in the ear and cause discomfort or hearing loss. What increases the risk? This condition is more likely to develop in people who:  Are female.  Are elderly.  Naturally produce more earwax.  Clean their ears often with cotton swabs.  Use earplugs often.  Use in-ear headphones often.  Wear hearing aids.  Have narrow ear canals.  Have earwax that is overly thick or sticky.  Have eczema.  Are dehydrated.  Have excess hair in the ear canal.  What are the signs or symptoms? Symptoms of this condition include:  Reduced or muffled hearing.  A feeling of fullness in the ear or feeling that the ear is plugged.  Fluid coming from the ear.  Ear pain.  Ear itch.  Ringing in the ear.  Coughing.  An obvious piece of earwax that can be seen inside the ear canal.  How is this diagnosed? This condition may be diagnosed based on:  Your symptoms.  Your medical history.  An ear exam. During the exam, your health care provider will look into your ear with an instrument called an otoscope.  You may have tests, including a hearing test. How is this treated? This condition may be treated by:  Using ear drops to soften the earwax.  Having the earwax removed by a health care provider. The health care provider may: ? Flush  the ear with water. ? Use an instrument that has a loop on the end (curette). ? Use a suction device.  Surgery to remove the wax buildup. This may be done in severe cases.  Follow these instructions at home:  Take over-the-counter and prescription medicines only as told by your health care provider.  Do not put any objects, including cotton swabs, into your ear. You can clean the opening of your ear canal with a washcloth or facial tissue.  Follow instructions from your health care provider about cleaning your ears. Do not over-clean your ears.  Drink enough fluid to keep your urine clear or pale  yellow. This will help to thin the earwax.  Keep all follow-up visits as told by your health care provider. If earwax builds up in your ears often or if you use hearing aids, consider seeing your health care provider for routine, preventive ear cleanings. Ask your health care provider how often you should schedule your cleanings.  If you have hearing aids, clean them according to instructions from the manufacturer and your health care provider. Contact a health care provider if:  You have ear pain.  You develop a fever.  You have blood, pus, or other fluid coming from your ear.  You have hearing loss.  You have ringing in your ears that does not go away.  Your symptoms do not improve with treatment.  You feel like the room is spinning (vertigo). Summary  Earwax can build up in the ear and cause discomfort or hearing loss.  The most common symptoms of this condition include reduced or muffled hearing and a feeling of fullness in the ear or feeling that the ear is plugged.  This condition may be diagnosed based on your symptoms, your medical history, and an ear exam.  This condition may be treated by using ear drops to soften the earwax or by having the earwax removed by a health care provider.  Do not put any objects, including cotton swabs, into your ear. You can clean the opening of your ear canal with a washcloth or facial tissue. This information is not intended to replace advice given to you by your health care provider. Make sure you discuss any questions you have with your health care provider. Document Released: 09/04/2004 Document Revised: 10/08/2016 Document Reviewed: 10/08/2016 Elsevier Interactive Patient Education  [employer].

## 2018-07-01 NOTE — Progress Notes (Signed)
Pre visit review using our clinic review tool, if applicable. No additional management support is needed unless otherwise documented below in the visit note. 

## 2018-07-01 NOTE — Progress Notes (Signed)
Chief Complaint  Patient presents with  . Annual Exam   Annual with husband today  1. C/o neck>low back pain 10/10 at night Xray with moderate arthritis seeing [facility] 3x per week doing adjustments now causing left shoulder pain 7-8/10. She feels like left shoulder is slipping out of place and painful at night and pain with ROM. Flexeril made too sleepy and taking baclofen  2. Hot flashes side effects with estrogen pills and now on clonidine 0.1 mg qhs had to take qod was making her feel tired and increased urination w/o burning wants to know if this is side effect 3. Fatty liver f/u with GI [place]  4. HTN controlled on norvasc 10 mg qd hctz 25 gave h/a  Review of Systems  Constitutional: Negative for weight loss.  HENT: Negative for hearing loss.   Eyes: Negative for blurred vision.  Respiratory: Negative for shortness of breath.   Cardiovascular: Negative for chest pain.  Gastrointestinal: Negative for abdominal pain.  Musculoskeletal: Positive for joint pain.  Skin: Negative for rash.  Neurological: Negative for headaches.  Psychiatric/Behavioral: Negative for depression.   Past Medical History:  Diagnosis Date  . Abdominal pain, epigastric 12/17/2015  . Anxiety    pt denies having dx of anxiety  . BCC (basal cell carcinoma of skin)    right foot   . Bunion, right foot   . Chronic cough   . Closed fibular fracture 10/2008   Left - Sustained 2/2 fall down stairs (same fall as tibular fracture). S/P internal fixation.  . Duodenitis    EGD (07/14/2011) showing chronic duodenitis, H.pyloir neg  . Elevated liver enzymes   . Endometriosis    S/P TAH and salpingo-oophorectomy, right  . Epigastric pain    Chronic, thought 2/2 gastritis, EGD (07/14/2011) showing chronic duodenitis, H.pyloir neg // Repeat EGD (09/2011) - esophagealring, sessible polyp in stomach body, hiatal hernia - rec ad probiotic, await bx  . Esophageal ring    s/p dilatation during EGD (07/2011) -  Dr. Oneida Alar  . Fatty liver   . Gastritis    Thought due to chronic NSAID use 2/2 pain from her congenital hip abnormality  . Headache(784.0)    Migraines  . Hidradenitis suppurativa 05/2006  . 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 With chronic OA of hip  . History of migraine headaches    Typical symptoms - bright lights, unilateral (starting behind her left ear), throbbing .  Marland Kitchen Hypertension   . Internal hemorrhoids   . Osteoarthritis of hip   . Ovarian cancer ([place]) 1995  . Protrusio acetabuli    diagnosed at age 71  . Sessile colonic polyp    noted 09/2011 -- > rec colonoscopy in 10 years, 2023  . Surgical menopause 08/2007   On HRT. Occuring since 01/09 following TAH and R-salpingo-oophorectomy   . Tibial fracture 10/2008   Left - Sustained 2/2 fall down stairs. Patient is S/P closed internal fixation with Smith Nephew tibial nail locked proximal and distal  . Vitamin D deficiency 11/2009   Vit D level 15 in 11/2009   Past Surgical History:  Procedure Laterality Date  . ABDOMINAL HYSTERECTOMY    . CESAREAN SECTION  08/2005   Primary low transverse  . COLONOSCOPY  09/22/2011   RCV:ELFYBOF polyp in the rectum/Internal hemorrhoids, benign path  . ESOPHAGOGASTRODUODENOSCOPY  09/22/2011   BPZ:WCHE, esophageal/Sessile polyp in the body of the stomach/Hiatal hernia/ABDOMINAL PAIN LIKELY FUNCTIONAL  V. POST-INFECTIOUS IBS, path: mild chronic gastritis  . FRACTURE SURGERY     Right hip  . JOINT REPLACEMENT     right hip  . LAPAROSCOPY  10/2000   Operative laparoscopy with lysis of right adnexal adhesions and uterointestinal adhesions  . left tib fib    . OTHER SURGICAL HISTORY  02/2009   Closed treatment internal fixation, left tibia with Tamala Julian and Nephew tibial nail locked proximal and distal  . TOTAL ABDOMINAL HYSTERECTOMY W/ BILATERAL SALPINGOOPHORECTOMY  08/2007   for endometriosis. With concurrent right  salpingo-oophorectomy  (2009), left oophorectomy (1995)  . TUBAL LIGATION  08/2005   Right tubal ligation   Family History  Problem Relation Age of Onset  . Other Father        deceased while in service.   . Breast cancer Sister 53       remission  . Cancer Sister        breast  . Lupus Cousin   . Colon cancer Neg Hx   . Liver disease Neg Hx    Social History   Socioeconomic History  . Marital status: Married    Spouse name: Not on file  . Number of children: 2  . Years of education: bs degree  . Highest education level: Not on file  Occupational History  . 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 Financial resource strain: Not on file  . Food insecurity:    Worry: Not on file    Inability: Not on file  . Transportation needs:    Medical: Not on file    Non-medical: Not on file  Tobacco Use  . Smoking status: Never Smoker  . Smokeless tobacco: Never Used  Substance and Sexual Activity  . Alcohol use: No    Alcohol/week: 0.0 standard drinks  . Drug use: No  . Sexual activity: Not on file  Lifestyle  . Physical activity:    Days per week: Not on file    Minutes per session: Not on file  . Stress: Not on file  Relationships  . Social connections:    Talks on phone: Not on file    Gets together: Not on file    Attends religious service: Not on file    Active member of club or organization: Not on file    Attends meetings of clubs or organizations: Not on file    Relationship status: Not on file  . Intimate partner violence:    Fear of current or ex partner: Not on file    Emotionally abused: Not on file    Physically abused: Not on file    Forced sexual activity: Not on file  Other Topics Concern  . Not on file  Social History Narrative   Insurance: Medicare.   Patient is married with two children, [place] and [place].    She lives in [place] but children go to school in [place]   . amLODipine (NORVASC) 10 MG tablet Take 1 tablet (10 mg total) by mouth daily.  . baclofen (LIORESAL) 10 MG tablet Take 1 tablet (10 mg total) by mouth 2 (two) times daily as needed for muscle spasms. Or 2 tablets at night  . cyclobenzaprine (FLEXERIL) 10 MG tablet Take 1 tablet (10 mg total) by mouth at bedtime.  Marland Kitchen ibuprofen (ADVIL,MOTRIN) 600 MG tablet Take 1 tablet (600 mg total) by mouth every 6 (six) hours  as needed.   Allergies  Allergen Reactions  . Pineapple Shortness Of Breath and Swelling    Has Epi pen  . Hctz [Hydrochlorothiazide]     Headaches, foggy headed    . Latex Swelling  . Lisinopril     Cough   . Morphine And Related Itching  . Tape Swelling   Recent Results (from the past 2160 hour(s))  Comprehensive metabolic panel     Status: Abnormal   Collection Time: 05/25/18  8:03 AM  Result Value Ref Range   Glucose, Bld 89 65 - 99 mg/dL    Comment: .            Fasting reference interval .    BUN 8 7 - 25 mg/dL   Creat 0.83 0.50 - 1.10 mg/dL   BUN/Creatinine Ratio NOT APPLICABLE 6 - 22 (calc)   Sodium 139 135 - 146 mmol/L   Potassium 3.6 3.5 - 5.3 mmol/L   Chloride 103 98 - 110 mmol/L   CO2 26 20 - 32 mmol/L   Calcium 9.4 8.6 - 10.2 mg/dL   Total Protein 7.3 6.1 - 8.1 g/dL   Albumin 4.1 3.6 - 5.1 g/dL   Globulin 3.2 1.9 - 3.7 g/dL (calc)   AG Ratio 1.3 1.0 - 2.5 (calc)   Total Bilirubin 1.1 0.2 - 1.2 mg/dL   Alkaline phosphatase (APISO) 58 33 - 115 U/L   AST 31 (H) 10 - 30 U/L   ALT 33 (H) 6 - 29 U/L  Iron, TIBC and Ferritin Panel     Status: None   Collection Time: 05/25/18  8:03 AM  Result Value Ref Range   Iron 90 40 - 190 mcg/dL   TIBC 368 250 - 450 mcg/dL (calc)   %SAT 24 16 - 45 % (calc)   Ferritin 117 16 - 232 ng/mL  Measles/Mumps/Rubella Immunity     Status: None   Collection Time: 05/25/18  8:03 AM  Result Value Ref Range   Rubeola IgG 128.00 AU/mL    Comment: AU/mL            Interpretation -----            -------------- <25.00            Negative 25.00-29.99      Equivocal >29.99           Positive . A positive result indicates that the patient has antibody to measles virus. It does not differentiate  between an active or past infection. The clinical  diagnosis must be interpreted in conjunction with  clinical signs and symptoms of the patient.    Mumps IgG 86.00 AU/mL    Comment:  AU/mL           Interpretation -------         ---------------- <9.00             Negative 9.00-10.99        Equivocal >10.99            Positive A positive result indicates that the patient has  antibody to mumps virus. It does not differentiate between an  active or past infection. The clinical diagnosis must be interpreted in conjunction with clinical signs and symptoms of the patient. .    Rubella 8.67 index    Comment:     Index            Interpretation     -----            --------------       <  0.90            Not consistent with Immunity     0.90-0.99        Equivocal     > or = 1.00      Consistent with Immunity  . The presence of rubella IgG antibody suggests  immunization or past or current infection with rubella virus.   B12     Status: None   Collection Time: 05/25/18  8:03 AM  Result Value Ref Range   Vitamin B-12 410 200 - 1,100 pg/mL  Vitamin D (25 hydroxy)     Status: None   Collection Time: 05/25/18  8:03 AM  Result Value Ref Range   Vit D, 25-Hydroxy 31 30 - 100 ng/mL    Comment: Vitamin D Status         25-OH Vitamin D: . Deficiency:                    <20 ng/mL Insufficiency:             20 - 29 ng/mL Optimal:                 > or = 30 ng/mL . For 25-OH Vitamin D testing on patients on  D2-supplementation and patients for whom quantitation  of D2 and D3 fractions is required, the QuestAssureD(TM) 25-OH VIT D, (D2,D3), LC/MS/MS is recommended: order  code [phone] (patients >1yrs). . For more information on this test, go to: http://education.questdiagnostics.com/faq/FAQ163 (This link is being  provided for  informational/educational purposes only.)   T4, free     Status: None   Collection Time: 05/25/18  8:03 AM  Result Value Ref Range   Free T4 1.0 0.8 - 1.8 ng/dL  TSH     Status: None   Collection Time: 05/25/18  8:03 AM  Result Value Ref Range   TSH 2.23 mIU/L    Comment:           Reference Range .           > or = 20 Years  0.40-4.50 .                Pregnancy Ranges           First trimester    0.26-2.66           Second trimester   0.55-2.73           Third trimester    0.43-2.91   Lipid panel     Status: None   Collection Time: 05/25/18  8:03 AM  Result Value Ref Range   Cholesterol 159 <200 mg/dL   HDL 59 >50 mg/dL   Triglycerides 74 <150 mg/dL   LDL Cholesterol (Calc) 84 mg/dL (calc)    Comment: Reference range: <100 . Desirable range <100 mg/dL for primary prevention;   <70 mg/dL for patients with CHD or diabetic patients  with > or = 2 CHD risk factors. Marland Kitchen LDL-C is now calculated using the Martin-Hopkins  calculation, which is a validated novel method providing  better accuracy than the Friedewald equation in the  estimation of LDL-C.  Cresenciano Genre et al. Annamaria Helling. 2426;834(19): 2061-2068  (http://education.[facility].com/faq/FAQ164)    Total CHOL/HDL Ratio 2.7 <5.0 (calc)   Non-HDL Cholesterol (Calc) 100 <130 mg/dL (calc)    Comment: For patients with diabetes plus 1 major ASCVD risk  factor, treating to a non-HDL-C goal of <100 mg/dL  (LDL-C of <70 mg/dL) is considered a therapeutic  option.   CBC with Differential/Platelet     Status: None   Collection Time: 05/25/18  8:03 AM  Result Value Ref Range   WBC 7.3 3.8 - 10.8 Thousand/uL   RBC 4.43 3.80 - 5.10 Million/uL   Hemoglobin 12.1 11.7 - 15.5 g/dL   HCT 35.7 35.0 - 45.0 %   MCV 80.6 80.0 - 100.0 fL   MCH 27.3 27.0 - 33.0 pg   MCHC 33.9 32.0 - 36.0 g/dL   RDW 13.3 11.0 - 15.0 %   Platelets 316 140 - 400 Thousand/uL   MPV 10.8 7.5 - 12.5 fL   Neutro Abs 4,723 1,500 - 7,800 cells/uL    Lymphs Abs 1,840 850 - 3,900 cells/uL   WBC mixed population 599 200 - 950 cells/uL   Eosinophils Absolute 110 15 - 500 cells/uL   Basophils Absolute 29 0 - 200 cells/uL   Neutrophils Relative % 64.7 %   Total Lymphocyte 25.2 %   Monocytes Relative 8.2 %   Eosinophils Relative 1.5 %   Basophils Relative 0.4 %  HM MAMMOGRAPHY     Status: None   Collection Time: 06/20/18 12:00 AM  Result Value Ref Range   HM Mammogram 0-4 Bi-Rad 0-4 Bi-Rad, Self Reported Normal    Comment: [facility] negative 06/20/18    Objective  Body mass index is 25.94 kg/m. Wt Readings from Last 3 Encounters:  07/01/18 188 lb 9.6 oz (85.5 kg)  05/21/18 192 lb (87.1 kg)  05/19/18 168 lb (76.2 kg)   Temp Readings from Last 3 Encounters:  07/01/18 98.3 F (36.8 C) (Oral)  05/21/18 98.3 F (36.8 C) (Oral)  05/19/18 98 F (36.7 C) (Oral)   BP Readings from Last 3 Encounters:  07/01/18 138/88  05/21/18 136/80  05/19/18 138/79   Pulse Readings from Last 3 Encounters:  07/01/18 71  05/21/18 62  05/19/18 (!) 56    Physical Exam  Constitutional: She is oriented to person, place, and time. Vital signs are normal. She appears well-developed and well-nourished. She is cooperative.  HENT:  Head: Normocephalic and atraumatic.  Mouth/Throat: Oropharynx is clear and moist and mucous membranes are normal.  Eyes: Pupils are equal, round, and reactive to light. Conjunctivae are normal.  Cardiovascular: Normal rate, regular rhythm and normal heart sounds.  Pulmonary/Chest: Effort normal and breath sounds normal.  Musculoskeletal:       Left shoulder: She exhibits decreased range of motion and tenderness.  Neurological: She is alert and oriented to person, place, and time. Gait normal.  Skin: Skin is warm, dry and intact.  Psychiatric: She has a normal mood and affect. Her speech is normal and behavior is normal. Judgment and thought content normal. Cognition and memory are normal.  Nursing note and vitals  reviewed.   Assessment   1. Annual  2. Neck>low back pain likely 2/2 arthritsi and left shoulder pain  3. Hot flashes 4. Fatty liver  5. Urinary frequency  6. HTN controlled  Plan  Declines flu shot for now will think about it  Hep B immune, consider hep A vaccineh/o fatty liver  Tdap up to date10/26/17 pna 23 had 02/19/09 HCV neg 06/05/16  HIV neg 06/05/16 mammo 06/20/18 neg  Get copy of pap [facility] [place] saw 2019 s/p hysterectomy   Never smoker  Pap (s/p hysterectomy 08/2007 cervix, uterus, right ovary and fallopian tube removed with h/o endometriosis and abnormal pap)    Colonoscopy had 09/22/11 sessile polyp in rectum Neg malignancy and fundic gland  polyp and mild chronic gastritis neg H pylori. -current seeing [facility] for NASH + bx 04/09/18 Dr. Belva Chimes  rec pt make [facility] appt she will do rec exercise to lose Check UA today r/o UTI  2. F/u [facility]  Xray left shoulder today consider Dr. Roland Rack  Prn Baclofen flexeril too sleepy  3. Disc clonidine and side effects rebound HTN and urinary incontinence   Will try 1/2 dose qhs clonidine  4. F/u [facility] 5. UA today 6. Cont norvasc 10 mg qd  Provider: Dr. Olivia Mackie McLean-Scocuz[facility]

## 2018-07-02 ENCOUNTER — Telehealth: Payer: Self-pay | Admitting: Internal Medicine

## 2018-07-02 LAB — URINALYSIS, ROUTINE W REFLEX MICROSCOPIC
BILIRUBIN URINE: NEGATIVE
GLUCOSE, UA: NEGATIVE
Hgb urine dipstick: NEGATIVE
Ketones, ur: NEGATIVE
Leukocytes, UA: NEGATIVE
Nitrite: NEGATIVE
PROTEIN: NEGATIVE
Specific Gravity, Urine: 1.019 (ref 1.001–1.03)
pH: 7 (ref 5.0–8.0)

## 2018-07-02 NOTE — Telephone Encounter (Signed)
Copied from [employer] [id number]. Topic: Quick Communication - Lab Results (Clinic Use ONLY)  Relation to pt: self  Call back number:   [phone]  Reason for call:  Patient returning call   >> Jul 01, 2018  3:23 PM Babs Bertin, CMA wrote: Called patient to inform them of [id number] lab results. When patient returns call, triage nurse may disclose results.

## 2018-07-02 NOTE — Telephone Encounter (Signed)
Attempted to call pt for lab results but no answer at this time. Unable to leave message due to mailbox being full.

## 2018-07-05 ENCOUNTER — Encounter: Payer: Self-pay | Admitting: *Deleted

## 2018-07-06 ENCOUNTER — Encounter: Payer: Self-pay | Admitting: Internal Medicine

## 2018-07-07 ENCOUNTER — Telehealth: Payer: Self-pay | Admitting: Family Medicine

## 2018-07-07 NOTE — Telephone Encounter (Signed)
Dismissal letter in guarantor snapshot [facility]

## 2018-07-13 DIAGNOSIS — F331 Major depressive disorder, recurrent, moderate: Secondary | ICD-10-CM | POA: Diagnosis not present

## 2018-07-27 DIAGNOSIS — F331 Major depressive disorder, recurrent, moderate: Secondary | ICD-10-CM | POA: Diagnosis not present

## 2018-07-28 ENCOUNTER — Ambulatory Visit: Payer: BLUE CROSS/BLUE SHIELD | Admitting: Internal Medicine

## 2018-08-05 ENCOUNTER — Ambulatory Visit (INDEPENDENT_AMBULATORY_CARE_PROVIDER_SITE_OTHER): Payer: BLUE CROSS/BLUE SHIELD | Admitting: Internal Medicine

## 2018-08-05 ENCOUNTER — Encounter: Payer: Self-pay | Admitting: Internal Medicine

## 2018-08-05 VITALS — BP 118/82 | HR 66 | Temp 98.4°F | Ht 71.0 in | Wt 187.6 lb

## 2018-08-05 DIAGNOSIS — H6993 Unspecified Eustachian tube disorder, bilateral: Secondary | ICD-10-CM

## 2018-08-05 DIAGNOSIS — I10 Essential (primary) hypertension: Secondary | ICD-10-CM

## 2018-08-05 DIAGNOSIS — H9203 Otalgia, bilateral: Secondary | ICD-10-CM | POA: Diagnosis not present

## 2018-08-05 DIAGNOSIS — J309 Allergic rhinitis, unspecified: Secondary | ICD-10-CM

## 2018-08-05 MED ORDER — FLUTICASONE PROPIONATE 50 MCG/ACT NA SUSP: 1.0000 | Freq: Every day | NASAL | 11 refills | Status: DC

## 2018-08-05 NOTE — Progress Notes (Signed)
Chief Complaint  Patient presents with  . Ear Pain    right ear   F/u  1.c/o right ear pain x months and b/l at times pain 5/10 used debrox ear drops for wax and did not help and ears still itching ear wax drops did not help  2. C/o scratchy throat and nasal congestion and husband states she is snoring and this is new. She is not sure if she has allergies    Review of Systems  Constitutional: Negative for fever.  HENT: Positive for ear pain.        +scratchy throat  +ear itching    Respiratory: Negative for shortness of breath.   Cardiovascular: Negative for chest pain.  Gastrointestinal: Negative for abdominal pain.  Neurological: Negative for headaches.  Psychiatric/Behavioral: Negative for depression.   Past Medical History:  Diagnosis Date  . Abdominal pain, epigastric 12/17/2015  . Anxiety    pt denies having dx of anxiety  . BCC (basal cell carcinoma of skin)    right foot   . Bunion, right foot   . Chronic cough   . Closed fibular fracture 10/2008   Left - Sustained 2/2 fall down stairs (same fall as tibular fracture). S/P internal fixation.  . Duodenitis    EGD (07/14/2011) showing chronic duodenitis, H.pyloir neg  . Elevated liver enzymes   . Endometriosis    S/P TAH and salpingo-oophorectomy, right  . Epigastric pain    Chronic, thought 2/2 gastritis, EGD (07/14/2011) showing chronic duodenitis, H.pyloir neg // Repeat EGD (09/2011) - esophagealring, sessible polyp in stomach body, hiatal hernia - rec ad probiotic, await bx  . Esophageal ring    s/p dilatation during EGD (07/2011) - Dr. Oneida Alar  . Fatty liver   . Gastritis    Thought due to chronic NSAID use 2/2 pain from her congenital hip abnormality  . Headache(784.0)    Migraines  . Hidradenitis suppurativa 05/2006  . 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 With chronic OA of hip  . History of migraine headaches    Typical symptoms - bright  lights, unilateral (starting behind her left ear), throbbing .  Marland Kitchen Hot flashes   . Hypertension   . Internal hemorrhoids   . Osteoarthritis of hip   . Ovarian cancer ([place]) 1995  . Protrusio acetabuli    diagnosed at age 37  . Sessile colonic polyp    noted 09/2011 -- > rec colonoscopy in 10 years, 2023  . Surgical menopause 08/2007   On HRT. Occuring since 01/09 following TAH and R-salpingo-oophorectomy   . Tibial fracture 10/2008   Left - Sustained 2/2 fall down stairs. Patient is S/P closed internal fixation with Smith Nephew tibial nail locked proximal and distal  . Vitamin D deficiency 11/2009   Vit D level 15 in 11/2009   Past Surgical History:  Procedure Laterality Date  . ABDOMINAL HYSTERECTOMY    . CESAREAN SECTION  08/2005   Primary low transverse  . COLONOSCOPY  09/22/2011   NAT:FTDDUKG polyp in the rectum/Internal hemorrhoids, benign path  . ESOPHAGOGASTRODUODENOSCOPY  09/22/2011   URK:YHCW, esophageal/Sessile polyp in the body of the stomach/Hiatal hernia/ABDOMINAL PAIN LIKELY FUNCTIONAL V. POST-INFECTIOUS IBS, path: mild chronic gastritis  . FRACTURE SURGERY     Right hip  . JOINT REPLACEMENT     right hip  . LAPAROSCOPY  10/2000   Operative laparoscopy with lysis of right adnexal adhesions and uterointestinal adhesions  . left  tib fib    . OTHER SURGICAL HISTORY  02/2009   Closed treatment internal fixation, left tibia with Tamala Julian and Nephew tibial nail locked proximal and distal  . TOTAL ABDOMINAL HYSTERECTOMY W/ BILATERAL SALPINGOOPHORECTOMY  08/2007   for endometriosis. With concurrent right salpingo-oophorectomy  (2009), left oophorectomy (1995)  . TUBAL LIGATION  08/2005   Right tubal ligation   Family History  Problem Relation Age of Onset  . Other Father        deceased while in service.   . Breast cancer Sister 34       remission  . Cancer Sister        breast  . Lupus Cousin   . Colon cancer Neg Hx   . Liver disease Neg Hx    Social History    Socioeconomic History  . Marital status: Married    Spouse name: Not on file  . Number of children: 2  . Years of education: bs degree  . Highest education level: Not on file  Occupational History  . 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 Financial resource strain: Not on file  . Food insecurity:    Worry: Not on file    Inability: Not on file  . Transportation needs:    Medical: Not on file    Non-medical: Not on file  Tobacco Use  . Smoking status: Never Smoker  . Smokeless tobacco: Never Used  Substance and Sexual Activity  . Alcohol use: No    Alcohol/week: 0.0 standard drinks  . Drug use: No  . Sexual activity: Not on file  Lifestyle  . Physical activity:    Days per week: Not on file    Minutes per session: Not on file  . Stress: Not on file  Relationships  . Social connections:    Talks on phone: Not on file    Gets together: Not on file    Attends religious service: Not on file    Active member of club or organization: Not on file    Attends meetings of clubs or organizations: Not on file    Relationship status: Not on file  . Intimate partner violence:    Fear of current or ex partner: Not on file    Emotionally abused: Not on file    Physically abused: Not on file    Forced sexual activity: Not on file  Other Topics Concern  . Not on file  Social History Narrative   Insurance: Medicare.   Patient is married with two children, Bernard and Sarben.    She lives in [place] but children go to school in [place]  . amLODipine (NORVASC) 10 MG tablet Take 1 tablet (10 mg total) by mouth daily.  . baclofen (LIORESAL) 10 MG tablet Take 1 tablet (10 mg total) by mouth 2 (two) times daily as needed for muscle spasms. Or 2 tablets at night  . cloNIDine (CATAPRES) 0.1 MG tablet Take 0.05 mg by mouth 2 (two) times daily.  Marland Kitchen ibuprofen (ADVIL,MOTRIN) 600 MG tablet Take 1 tablet (600 mg  total) by mouth every 6 (six) hours as needed.   Allergies  Allergen Reactions  . Pineapple Shortness Of Breath and Swelling    Has Epi pen  . Hctz [Hydrochlorothiazide]     Headaches, foggy headed    . Latex Swelling  . Lisinopril     Cough   .  Morphine And Related Itching  . Tape Swelling   Recent Results (from the past 2160 hour(s))  Comprehensive metabolic panel     Status: Abnormal   Collection Time: 05/25/18  8:03 AM  Result Value Ref Range   Glucose, Bld 89 65 - 99 mg/dL    Comment: .            Fasting reference interval .    BUN 8 7 - 25 mg/dL   Creat 0.83 0.50 - 1.10 mg/dL   BUN/Creatinine Ratio NOT APPLICABLE 6 - 22 (calc)   Sodium 139 135 - 146 mmol/L   Potassium 3.6 3.5 - 5.3 mmol/L   Chloride 103 98 - 110 mmol/L   CO2 26 20 - 32 mmol/L   Calcium 9.4 8.6 - 10.2 mg/dL   Total Protein 7.3 6.1 - 8.1 g/dL   Albumin 4.1 3.6 - 5.1 g/dL   Globulin 3.2 1.9 - 3.7 g/dL (calc)   AG Ratio 1.3 1.0 - 2.5 (calc)   Total Bilirubin 1.1 0.2 - 1.2 mg/dL   Alkaline phosphatase (APISO) 58 33 - 115 U/L   AST 31 (H) 10 - 30 U/L   ALT 33 (H) 6 - 29 U/L  Iron, TIBC and Ferritin Panel     Status: None   Collection Time: 05/25/18  8:03 AM  Result Value Ref Range   Iron 90 40 - 190 mcg/dL   TIBC 368 250 - 450 mcg/dL (calc)   %SAT 24 16 - 45 % (calc)   Ferritin 117 16 - 232 ng/mL  Measles/Mumps/Rubella Immunity     Status: None   Collection Time: 05/25/18  8:03 AM  Result Value Ref Range   Rubeola IgG 128.00 AU/mL    Comment: AU/mL            Interpretation -----            -------------- <25.00           Negative 25.00-29.99      Equivocal >29.99           Positive . A positive result indicates that the patient has antibody to measles virus. It does not differentiate  between an active or past infection. The clinical  diagnosis must be interpreted in conjunction with  clinical signs and symptoms of the patient.    Mumps IgG 86.00 AU/mL    Comment:  AU/mL            Interpretation -------         ---------------- <9.00             Negative 9.00-10.99        Equivocal >10.99            Positive A positive result indicates that the patient has  antibody to mumps virus. It does not differentiate between an  active or past infection. The clinical diagnosis must be interpreted in conjunction with clinical signs and symptoms of the patient. .    Rubella 8.67 index    Comment:     Index            Interpretation     -----            --------------       <0.90            Not consistent with Immunity     0.90-0.99        Equivocal     > or = 1.00  Consistent with Immunity  . The presence of rubella IgG antibody suggests  immunization or past or current infection with rubella virus.   B12     Status: None   Collection Time: 05/25/18  8:03 AM  Result Value Ref Range   Vitamin B-12 410 200 - 1,100 pg/mL  Vitamin D (25 hydroxy)     Status: None   Collection Time: 05/25/18  8:03 AM  Result Value Ref Range   Vit D, 25-Hydroxy 31 30 - 100 ng/mL    Comment: Vitamin D Status         25-OH Vitamin D: . Deficiency:                    <20 ng/mL Insufficiency:             20 - 29 ng/mL Optimal:                 > or = 30 ng/mL . For 25-OH Vitamin D testing on patients on  D2-supplementation and patients for whom quantitation  of D2 and D3 fractions is required, the QuestAssureD(TM) 25-OH VIT D, (D2,D3), LC/MS/MS is recommended: order  code 954 [id number] 4888 (patients >74yrs). . For more information on this test, go to: http://education.quest[facility].com/faq/FAQ163 (This link is being provided for  informational/educational purposes only.)   T4, free     Status: None   Collection Time: 05/25/18  8:03 AM  Result Value Ref Range   Free T4 1.0 0.8 - 1.8 ng/dL  TSH     Status: None   Collection Time: 05/25/18  8:03 AM  Result Value Ref Range   TSH 2.23 mIU/L    Comment:           Reference Range .           > or = 20 Years  0.40-4.50 .                 Pregnancy Ranges           First trimester    0.26-2.66           Second trimester   0.55-2.73           Third trimester    0.43-2.91   Lipid panel     Status: None   Collection Time: 05/25/18  8:03 AM  Result Value Ref Range   Cholesterol 159 <200 mg/dL   HDL 59 >50 mg/dL   Triglycerides 74 <150 mg/dL   LDL Cholesterol (Calc) 84 mg/dL (calc)    Comment: Reference range: <100 . Desirable range <100 mg/dL for primary prevention;   <70 mg/dL for patients with CHD or diabetic patients  with > or = 2 CHD risk factors. Marland Kitchen LDL-C is now calculated using the Martin-Hopkins  calculation, which is a validated novel method providing  better accuracy than the Friedewald equation in the  estimation of LDL-C.  Cresenciano Genre et al. Annamaria Helling. 4742;595(63): 2061-2068  (http://education.[facility].com/faq/FAQ164)    Total CHOL/HDL Ratio 2.7 <5.0 (calc)   Non-HDL Cholesterol (Calc) 100 <130 mg/dL (calc)    Comment: For patients with diabetes plus 1 major ASCVD risk  factor, treating to a non-HDL-C goal of <100 mg/dL  (LDL-C of <70 mg/dL) is considered a therapeutic  option.   CBC with Differential/Platelet     Status: None   Collection Time: 05/25/18  8:03 AM  Result Value Ref Range   WBC 7.3 3.8 - 10.8 Thousand/uL   RBC 4.43 3.80 - 5.10 Million/uL  Hemoglobin 12.1 11.7 - 15.5 g/dL   HCT 35.7 35.0 - 45.0 %   MCV 80.6 80.0 - 100.0 fL   MCH 27.3 27.0 - 33.0 pg   MCHC 33.9 32.0 - 36.0 g/dL   RDW 13.3 11.0 - 15.0 %   Platelets 316 140 - 400 Thousand/uL   MPV 10.8 7.5 - 12.5 fL   Neutro Abs 4,723 1,500 - 7,800 cells/uL   Lymphs Abs 1,840 850 - 3,900 cells/uL   WBC mixed population 599 200 - 950 cells/uL   Eosinophils Absolute 110 15 - 500 cells/uL   Basophils Absolute 29 0 - 200 cells/uL   Neutrophils Relative % 64.7 %   Total Lymphocyte 25.2 %   Monocytes Relative 8.2 %   Eosinophils Relative 1.5 %   Basophils Relative 0.4 %  HM PAP SMEAR     Status: None   Collection Time: 05/26/18  12:00 AM  Result Value Ref Range   HM Pap smear normal     Comment: normal neg +fungus, no HPV testing done Dr. Luan Pulling   HM MAMMOGRAPHY     Status: None   Collection Time: 06/18/18 12:00 AM  Result Value Ref Range   HM Mammogram 0-4 Bi-Rad 0-4 Bi-Rad, Self Reported Normal    Comment: [facility] negative 06/20/18   Urinalysis, Routine w reflex microscopic     Status: None   Collection Time: 07/01/18  9:44 AM  Result Value Ref Range   Color, Urine YELLOW YELLOW   APPearance CLEAR CLEAR   Specific Gravity, Urine 1.019 1.001 - 1.03   pH 7.0 5.0 - 8.0   Glucose, UA NEGATIVE NEGATIVE   Bilirubin Urine NEGATIVE NEGATIVE   Ketones, ur NEGATIVE NEGATIVE   Hgb urine dipstick NEGATIVE NEGATIVE   Protein, ur NEGATIVE NEGATIVE   Nitrite NEGATIVE NEGATIVE   Leukocytes, UA NEGATIVE NEGATIVE   Objective  Body mass index is 26.16 kg/m. Wt Readings from Last 3 Encounters:  08/05/18 187 lb 9.6 oz (85.1 kg)  07/01/18 188 lb 9.6 oz (85.5 kg)  05/21/18 192 lb (87.1 kg)   Temp Readings from Last 3 Encounters:  08/05/18 98.4 F (36.9 C) (Oral)  07/01/18 98.3 F (36.8 C) (Oral)  05/21/18 98.3 F (36.8 C) (Oral)   BP Readings from Last 3 Encounters:  08/05/18 118/82  07/01/18 138/88  05/21/18 136/80   Pulse Readings from Last 3 Encounters:  08/05/18 66  07/01/18 71  05/21/18 62    Physical Exam Vitals signs and nursing note reviewed.  Constitutional:      Appearance: Normal appearance. She is well-developed.  HENT:     Head: Normocephalic and atraumatic.     Ears:     Comments: B/l ear wax      Nose: Nose normal.     Mouth/Throat:     Mouth: Mucous membranes are moist.     Pharynx: Oropharynx is clear.  Eyes:     Conjunctiva/sclera: Conjunctivae normal.     Pupils: Pupils are equal, round, and reactive to light.  Cardiovascular:     Rate and Rhythm: Normal rate and regular rhythm.     Heart sounds: Normal heart sounds.  Pulmonary:     Effort: Pulmonary effort is normal.      Breath sounds: Normal breath sounds.  Skin:    General: Skin is warm and dry.  Neurological:     General: No focal deficit present.     Mental Status: She is alert and oriented to person, place, and time.  Gait: Gait normal.  Psychiatric:        Attention and Perception: Attention and perception normal.        Mood and Affect: Mood and affect normal.        Speech: Speech normal.        Behavior: Behavior normal. Behavior is cooperative.        Thought Content: Thought content normal.        Cognition and Memory: Cognition and memory normal.        Judgment: Judgment normal.     Assessment   1. Right>left ear pain and itching could be multifactorial due to wax w/o impaction and allergic rhinitis >eustachian tube d/o and mild fluid in b/l ears 2. Nasal congestion ddx URI vs allergies  3. HTN controlled  4. HM Plan   1and 2. Trial of ah otc 1/2 dose qhs prn and flonase  If not better call back and will refer Dr. Benjamine Mola in [place]  Removed wax with currette today b/l ears mild no charge today 3. Cont meds controlled  4.  Declines flu shot for now will think about it Hep B immune, consider hep A vaccineh/o fatty liver Tdap up to date10/26/17 pna 23 had 02/19/09 HCV neg 06/05/16  HIV neg 06/05/16 mammo 06/20/18 neg  Get copy of pap [facility] in [place] saw 2019 s/p hysterectomy  Provider: Dr. Olivia Mackie McLean-[facility]oc[facility]

## 2018-08-05 NOTE — Patient Instructions (Addendum)
Can try Zyrtec 10 (1/2 pill), allegra or claritin 10 (try 1/2 pill) at night as needed  Try allegra 60 or 180 60 mg at night as needed   Try flonase as needed if does not help with ear symptoms will refer to ENT Dr. Benjamine Mola in [place]   Warm salt gargles for scratchy throat  Warm tea honey and lemon    Earwax Buildup, Adult The ears produce a substance called earwax that helps keep bacteria out of the ear and protects the skin in the ear canal. Occasionally, earwax can build up in the ear and cause discomfort or hearing loss. What increases the risk? This condition is more likely to develop in people who:  Are female.  Are elderly.  Naturally produce more earwax.  Clean their ears often with cotton swabs.  Use earplugs often.  Use in-ear headphones often.  Wear hearing aids.  Have narrow ear canals.  Have earwax that is overly thick or sticky.  Have eczema.  Are dehydrated.  Have excess hair in the ear canal. What are the signs or symptoms? Symptoms of this condition include:  Reduced or muffled hearing.  A feeling of fullness in the ear or feeling that the ear is plugged.  Fluid coming from the ear.  Ear pain.  Ear itch.  Ringing in the ear.  Coughing.  An obvious piece of earwax that can be seen inside the ear canal. How is this diagnosed? This condition may be diagnosed based on:  Your symptoms.  Your medical history.  An ear exam. During the exam, your health care provider will look into your ear with an instrument called an otoscope. You may have tests, including a hearing test. How is this treated? This condition may be treated by:  Using ear drops to soften the earwax.  Having the earwax removed by a health care provider. The health care provider may: ? Flush the ear with water. ? Use an instrument that has a loop on the end (curette). ? Use a suction device.  Surgery to remove the wax buildup. This may be done in severe cases. Follow  these instructions at home:   Take over-the-counter and prescription medicines only as told by your health care provider.  Do not put any objects, including cotton swabs, into your ear. You can clean the opening of your ear canal with a washcloth or facial tissue.  Follow instructions from your health care provider about cleaning your ears. Do not over-clean your ears.  Drink enough fluid to keep your urine clear or pale yellow. This will help to thin the earwax.  Keep all follow-up visits as told by your health care provider. If earwax builds up in your ears often or if you use hearing aids, consider seeing your health care provider for routine, preventive ear cleanings. Ask your health care provider how often you should schedule your cleanings.  If you have hearing aids, clean them according to instructions from the manufacturer and your health care provider. Contact a health care provider if:  You have ear pain.  You develop a fever.  You have blood, pus, or other fluid coming from your ear.  You have hearing loss.  You have ringing in your ears that does not go away.  Your symptoms do not improve with treatment.  You feel like the room is spinning (vertigo). Summary  Earwax can build up in the ear and cause discomfort or hearing loss.  The most common symptoms of this  condition include reduced or muffled hearing and a feeling of fullness in the ear or feeling that the ear is plugged.  This condition may be diagnosed based on your symptoms, your medical history, and an ear exam.  This condition may be treated by using ear drops to soften the earwax or by having the earwax removed by a health care provider.  Do not put any objects, including cotton swabs, into your ear. You can clean the opening of your ear canal with a washcloth or facial tissue. This information is not intended to replace advice given to you by your health care provider. Make sure you discuss any questions  you have with your health care provider. Document Released: 09/04/2004 Document Revised: 07/09/2017 Document Reviewed: 10/08/2016 Elsevier Interactive Patient Education  2019 Elsevier Inc.   Allergic Rhinitis, Adult Allergic rhinitis is an allergic reaction that affects the mucous membrane inside the nose. It causes sneezing, a runny or stuffy nose, and the feeling of mucus going down the back of the throat (postnasal drip). Allergic rhinitis can be mild to severe. There are two types of allergic rhinitis:  Seasonal. This type is also called hay fever. It happens only during certain seasons.  Perennial. This type can happen at any time of the year. What are the causes? This condition happens when the body's defense system (immune system) responds to certain harmless substances called allergens as though they were germs.  Seasonal allergic rhinitis is triggered by pollen, which can come from grasses, trees, and weeds. Perennial allergic rhinitis may be caused by:  House dust mites.  Pet dander.  Mold spores. What are the signs or symptoms? Symptoms of this condition include:  Sneezing.  Runny or stuffy nose (nasal congestion).  Postnasal drip.  Itchy nose.  Tearing of the eyes.  Trouble sleeping.  Daytime sleepiness. How is this diagnosed? This condition may be diagnosed based on:  Your medical history.  A physical exam.  Tests to check for related conditions, such as: ? Asthma. ? Pink eye. ? Ear infection. ? Upper respiratory infection.  Tests to find out which allergens trigger your symptoms. These may include skin or blood tests. How is this treated? There is no cure for this condition, but treatment can help control symptoms. Treatment may include:  Taking medicines that block allergy symptoms, such as antihistamines. Medicine may be given as a shot, nasal spray, or pill.  Avoiding the allergen.  Desensitization. This treatment involves getting ongoing  shots until your body becomes less sensitive to the allergen. This treatment may be done if other treatments do not help.  If taking medicine and avoiding the allergen does not work, new, stronger medicines may be prescribed. Follow these instructions at home:  Find out what you are allergic to. Common allergens include smoke, dust, and pollen.  Avoid the things you are allergic to. These are some things you can do to help avoid allergens: ? Replace carpet with wood, tile, or vinyl flooring. Carpet can trap dander and dust. ? Do not smoke. Do not allow smoking in your home. ? Change your heating and air conditioning filter at least once a month. ? During allergy season:  Keep windows closed as much as possible.  Plan outdoor activities when pollen counts are lowest. This is usually during the evening hours.  When coming indoors, change clothing and shower before sitting on furniture or bedding.  Take over-the-counter and prescription medicines only as told by your health care provider.  Keep all  follow-up visits as told by your health care provider. This is important. Contact a health care provider if:  You have a fever.  You develop a persistent cough.  You make whistling sounds when you breathe (you wheeze).  Your symptoms interfere with your normal daily activities. Get help right away if:  You have shortness of breath. Summary  This condition can be managed by taking medicines as directed and avoiding allergens.  Contact your health care provider if you develop a persistent cough or fever.  During allergy season, keep windows closed as much as possible. This information is not intended to replace advice given to you by your health care provider. Make sure you discuss any questions you have with your health care provider. Document Released: 04/22/2001 Document Revised: 09/04/2016 Document Reviewed: 09/04/2016 Elsevier Interactive Patient Education  2019 [employer].

## 2018-08-06 ENCOUNTER — Emergency Department: Payer: BLUE CROSS/BLUE SHIELD

## 2018-08-06 ENCOUNTER — Other Ambulatory Visit: Payer: Self-pay

## 2018-08-06 ENCOUNTER — Encounter: Payer: Self-pay | Admitting: *Deleted

## 2018-08-06 DIAGNOSIS — I10 Essential (primary) hypertension: Secondary | ICD-10-CM | POA: Insufficient documentation

## 2018-08-06 DIAGNOSIS — M25532 Pain in left wrist: Secondary | ICD-10-CM | POA: Diagnosis not present

## 2018-08-06 DIAGNOSIS — Y92512 Supermarket, store or market as the place of occurrence of the external cause: Secondary | ICD-10-CM | POA: Diagnosis not present

## 2018-08-06 DIAGNOSIS — S0990XA Unspecified injury of head, initial encounter: Secondary | ICD-10-CM | POA: Diagnosis not present

## 2018-08-06 DIAGNOSIS — W01198A Fall on same level from slipping, tripping and stumbling with subsequent striking against other object, initial encounter: Secondary | ICD-10-CM | POA: Diagnosis not present

## 2018-08-06 DIAGNOSIS — H53149 Visual discomfort, unspecified: Secondary | ICD-10-CM | POA: Insufficient documentation

## 2018-08-06 DIAGNOSIS — S060X0A Concussion without loss of consciousness, initial encounter: Secondary | ICD-10-CM | POA: Diagnosis not present

## 2018-08-06 DIAGNOSIS — R51 Headache: Secondary | ICD-10-CM | POA: Diagnosis not present

## 2018-08-06 DIAGNOSIS — S79912A Unspecified injury of left hip, initial encounter: Secondary | ICD-10-CM | POA: Diagnosis not present

## 2018-08-06 DIAGNOSIS — Z79899 Other long term (current) drug therapy: Secondary | ICD-10-CM | POA: Insufficient documentation

## 2018-08-06 DIAGNOSIS — S6992XA Unspecified injury of left wrist, hand and finger(s), initial encounter: Secondary | ICD-10-CM | POA: Diagnosis not present

## 2018-08-06 DIAGNOSIS — M7918 Myalgia, other site: Secondary | ICD-10-CM | POA: Diagnosis not present

## 2018-08-06 DIAGNOSIS — Z8543 Personal history of malignant neoplasm of ovary: Secondary | ICD-10-CM | POA: Insufficient documentation

## 2018-08-06 DIAGNOSIS — Y998 Other external cause status: Secondary | ICD-10-CM | POA: Insufficient documentation

## 2018-08-06 DIAGNOSIS — Z96641 Presence of right artificial hip joint: Secondary | ICD-10-CM | POA: Insufficient documentation

## 2018-08-06 DIAGNOSIS — Z91040 Latex allergy status: Secondary | ICD-10-CM | POA: Insufficient documentation

## 2018-08-06 DIAGNOSIS — Y9301 Activity, walking, marching and hiking: Secondary | ICD-10-CM | POA: Insufficient documentation

## 2018-08-06 DIAGNOSIS — M79642 Pain in left hand: Secondary | ICD-10-CM | POA: Diagnosis not present

## 2018-08-06 DIAGNOSIS — M25552 Pain in left hip: Secondary | ICD-10-CM | POA: Diagnosis not present

## 2018-08-06 NOTE — ED Notes (Signed)
Patient transported to X-ray 

## 2018-08-06 NOTE — ED Triage Notes (Signed)
Pt around 2100 reports that she slipped and fell on a slick spot in a store. She hit her head on rack then fell on the left side. No LOC. She is hurting all along the left side, particularly in the left hip and left hand. Took ibuprofen PTA.

## 2018-08-07 ENCOUNTER — Emergency Department: Payer: BLUE CROSS/BLUE SHIELD

## 2018-08-07 ENCOUNTER — Emergency Department
Admission: EM | Admit: 2018-08-07 | Discharge: 2018-08-07 | Disposition: A | Discharge status: Home / Self Care | Payer: BLUE CROSS/BLUE SHIELD | Attending: Emergency Medicine | Admitting: Emergency Medicine

## 2018-08-07 DIAGNOSIS — S060X0A Concussion without loss of consciousness, initial encounter: Secondary | ICD-10-CM

## 2018-08-07 DIAGNOSIS — S0990XA Unspecified injury of head, initial encounter: Secondary | ICD-10-CM | POA: Diagnosis not present

## 2018-08-07 DIAGNOSIS — M7918 Myalgia, other site: Secondary | ICD-10-CM

## 2018-08-07 MED ORDER — IBUPROFEN 600 MG PO TABS
600.0000 mg | ORAL_TABLET | Freq: Once | ORAL | Status: AC
  Administered 2018-08-07: 600 mg via ORAL
  Filled 2018-08-07 (×2): qty 1

## 2018-08-07 MED ORDER — ONDANSETRON 4 MG PO TBDP
4.0000 mg | ORAL_TABLET | Freq: Once | ORAL | Status: AC
  Administered 2018-08-07: 4 mg via ORAL
  Filled 2018-08-07: qty 1

## 2018-08-07 MED ORDER — BUTALBITAL-APAP-CAFFEINE 50-325-40 MG PO TABS
1.0000 | ORAL_TABLET | Freq: Once | ORAL | Status: DC
  Filled 2018-08-07: qty 1

## 2018-08-07 NOTE — ED Notes (Signed)
Patient transported to CT 

## 2018-08-07 NOTE — ED Provider Notes (Signed)
[facility] Emergency Department Provider Note   First MD Initiated Contact with Patient 08/07/18 0201     (approximate)  I have reviewed the triage vital signs and the nursing notes.   HISTORY  Chief Complaint Fall    HPI [name] is a 42 y.o. female with below list of chronic medical conditions presents to the emergency department following accidental slip and fall while in [facility] yesterday.  Patient admits to pain left hip hand/wrist.  Patient also admits to hitting her head during the fall followed by being confused and dazed however patient just not admit to loss of consciousness.  Patient admits to continued headache since the fall current pain score 8 out of 10.  Patient admits to photosensitivity as well.  Patient denies any weakness numbness or gait instability.   Past Medical History:  Diagnosis Date  . Abdominal pain, epigastric 12/17/2015  . Anxiety    pt denies having dx of anxiety  . BCC (basal cell carcinoma of skin)    right foot   . Bunion, right foot   . Chronic cough   . Closed fibular fracture 10/2008   Left - Sustained 2/2 fall down stairs (same fall as tibular fracture). S/P internal fixation.  . Duodenitis    EGD (07/14/2011) showing chronic duodenitis, H.pyloir neg  . Elevated liver enzymes   . Endometriosis    S/P TAH and salpingo-oophorectomy, right  . Epigastric pain    Chronic, thought 2/2 gastritis, EGD (07/14/2011) showing chronic duodenitis, H.pyloir neg // Repeat EGD (09/2011) - esophagealring, sessible polyp in stomach body, hiatal hernia - rec ad probiotic, await bx  . Esophageal ring    s/p dilatation during EGD (07/2011) - Dr. Oneida Alar  . Fatty liver   . Gastritis    Thought due to chronic NSAID use 2/2 pain from her congenital hip abnormality  . Headache(784.0)    Migraines  . Hidradenitis suppurativa 05/2006  . Hip deformity, congenital    Protrusio acetabuli with articular sclerosis and flattening  of femoral heads. With chronic OA of hip  . History of migraine headaches    Typical symptoms - bright lights, unilateral (starting behind her left ear), throbbing .  Marland Kitchen Hot flashes   . Hypertension   . Internal hemorrhoids   . Osteoarthritis of hip   . Ovarian cancer ([place]) 1995  . Protrusio acetabuli    diagnosed at age 75  . Sessile colonic polyp    noted 09/2011 -- > rec colonoscopy in 10 years, 2023  . Surgical menopause 08/2007   On HRT. Occuring since 01/09 following TAH and R-salpingo-oophorectomy   . Tibial fracture 10/2008   Left - Sustained 2/2 fall down stairs. Patient is S/P closed internal fixation with Smith Nephew tibial nail locked proximal and distal  . Vitamin D deficiency 11/2009   Vit D level 15 in 11/2009    Patient Active Problem List   Diagnosis Date Noted  . Allergic rhinitis 08/05/2018  . Arthritis 07/01/2018  . DDD (degenerative disc disease), lumbar 05/21/2018  . Thoracic arthritis 05/21/2018  . DDD (degenerative disc disease), cervical 01/06/2018  . Hidradenitis suppurativa 10/11/2017  . Chronic frontal sinusitis 10/11/2017  . Fatty liver 10/11/2017  . Chronic ethmoidal sinusitis 09/13/2017  . Photophobia of both eyes 09/13/2017  . Abnormal liver enzymes 09/13/2017  . History of hysterectomy with bilateral oophorectomy 06/24/2017  . Estrogen deficiency 06/24/2017  . Overweight 11/17/2016  . Abdominal pain 11/04/2016  . Headache 12/17/2015  .  Hiatal hernia 02/20/2012  . Chronic hip pain 02/20/2012  . Fatigue 12/19/2011  . Acetabulum intrapelvic protrusion into pelvic region or thigh 05/15/2011  . Annual physical exam 10/03/2010  . Hypertension 03/20/2010  . Vitamin D deficiency 08/20/2009  . Migraine headache 05/13/2006    Past Surgical History:  Procedure Laterality Date  . ABDOMINAL HYSTERECTOMY    . CESAREAN SECTION  08/2005   Primary low transverse  . COLONOSCOPY  09/22/2011   [id number] polyp in the rectum/Internal hemorrhoids,  benign path  . ESOPHAGOGASTRODUODENOSCOPY  09/22/2011   FAO:ZHYQ, esophageal/Sessile polyp in the body of the stomach/Hiatal hernia/ABDOMINAL PAIN LIKELY FUNCTIONAL V. POST-INFECTIOUS IBS, path: mild chronic gastritis  . FRACTURE SURGERY     Right hip  . JOINT REPLACEMENT     right hip  . LAPAROSCOPY  10/2000   Operative laparoscopy with lysis of right adnexal adhesions and uterointestinal adhesions  . left tib fib    . OTHER SURGICAL HISTORY  02/2009   Closed treatment internal fixation, left tibia with Tamala Julian and Nephew tibial nail locked proximal and distal  . TOTAL ABDOMINAL HYSTERECTOMY W/ BILATERAL SALPINGOOPHORECTOMY  08/2007   for endometriosis. With concurrent right salpingo-oophorectomy  (2009), left oophorectomy (1995)  . TUBAL LIGATION  08/2005   Right tubal ligation    Prior to Admission medications   Medication Sig Start Date End Date Taking? Authorizing Provider  amLODipine (NORVASC) 10 MG tablet Take 1 tablet (10 mg total) by mouth daily. 06/15/18   McLean-Scocuzza, Nino Glow, MD  baclofen (LIORESAL) 10 MG tablet Take 1 tablet (10 mg total) by mouth 2 (two) times daily as needed for muscle spasms. Or 2 tablets at night 06/17/18   McLean-Scocuzza, Nino Glow, MD  cloNIDine (CATAPRES) 0.1 MG tablet Take 0.05 mg by mouth 2 (two) times daily.    [provider]  fluticasone (FLONASE) 50 MCG/ACT nasal spray Place 1-2 sprays into both nostrils daily. Prn 08/05/18   McLean-Scocuzza, Nino Glow, MD  ibuprofen (ADVIL,MOTRIN) 600 MG tablet Take 1 tablet (600 mg total) by mouth every 6 (six) hours as needed. 05/07/18   Melynda Ripple, MD    Allergies Pineapple; Hctz [hydrochlorothiazide]; Latex; Lisinopril; Morphine and related; and Tape  Family History  Problem Relation Age of Onset  . Other Father        deceased while in service.   . Breast cancer Sister 11       remission  . Cancer Sister        breast  . Lupus Cousin   . Colon cancer Neg Hx   . Liver disease Neg Hx       Social History Social History   Tobacco Use  . Smoking status: Never Smoker  . Smokeless tobacco: Never Used  Substance Use Topics  . Alcohol use: No    Alcohol/week: 0.0 standard drinks  . Drug use: No    Review of Systems Constitutional: No fever/chills Eyes: No visual changes. ENT: No sore throat. Cardiovascular: Denies chest pain. Respiratory: Denies shortness of breath. Gastrointestinal: No abdominal pain.  No nausea, no vomiting.  No diarrhea.  No constipation. Genitourinary: Negative for dysuria. Musculoskeletal: Negative for neck pain.  Negative for back pain. Integumentary: Negative for rash. Neurological: Positive for headaches, negative for focal weakness or numbness.   ____________________________________________   PHYSICAL EXAM:  VITAL SIGNS: ED Triage Vitals  Enc Vitals Group     BP 08/06/18 2216 (!) 141/98     Pulse Rate 08/06/18 2216 65  Resp 08/06/18 2216 16     Temp 08/06/18 2216 98.2 F (36.8 C)     Temp Source 08/06/18 2216 Oral     SpO2 08/06/18 2216 100 %     Weight 08/06/18 2216 74.8 kg (165 lb)     Height 08/06/18 2216 1.803 m (5\' 11" )     Head Circumference --      Peak Flow --      Pain Score 08/06/18 2214 8     Pain Loc --      Pain Edu? --      Excl. in [place]? --     Constitutional: Alert and oriented.  Apparent discomfort  eyes: Conjunctivae are normal. Head: Atraumatic. Mouth/Throat: Mucous membranes are moist. Oropharynx non-erythematous. Neck: No stridor.   Cardiovascular: Normal rate, regular rhythm. Good peripheral circulation. Grossly normal heart sounds. Respiratory: Normal respiratory effort.  No retractions. Lungs CTAB. Gastrointestinal: Soft and nontender. No distention.  Musculoskeletal: Left wrist pain with active and passive range of motion.  Pain with palpation left hip. Neurologic:  Normal speech and language. No gross focal neurologic deficits are appreciated.  Skin:  Skin is warm, dry and intact. No rash  noted. Psychiatric: Mood and affect are normal. Speech and behavior are normal.  _______________________________________  RADIOLOGY I, [place] Janisha Bueso, personally viewed and evaluated these images (plain radiographs) as part of my medical decision making, as well as reviewing the written report by the radiologist.  ED MD interpretation: X-rays of the left hand left hip and CT scan of the head revealed no acute pathology.  Official radiology report(s): Dg Hand Complete Left  Result Date: 08/06/2018 CLINICAL DATA:  Acute onset of left hand pain after fall. Initial encounter. EXAM: LEFT HAND - COMPLETE 3+ VIEW COMPARISON:  None. FINDINGS: There is no evidence of fracture or dislocation. The joint spaces are preserved. The carpal rows are intact, and demonstrate normal alignment. The soft tissues are unremarkable in appearance. IMPRESSION: No evidence of fracture or dislocation. Electronically Signed   By: Garald Balding M.D.   On: 08/06/2018 22:54   Dg Hip Unilat With Pelvis 2-3 Views Left  Result Date: 08/06/2018 CLINICAL DATA:  Acute onset of left hip pain after fall. Initial encounter. EXAM: DG HIP (WITH OR WITHOUT PELVIS) 2-3V LEFT COMPARISON:  None. FINDINGS: There is no evidence of fracture or dislocation. Bilateral hip arthroplasties appear grossly intact, without evidence of loosening. The sacroiliac joints are grossly unremarkable in appearance. The visualized bowel gas pattern is grossly unremarkable in appearance. IMPRESSION: No evidence of fracture or dislocation. Bilateral hip arthroplasties appear grossly intact, without evidence of loosening. Electronically Signed   By: Garald Balding M.D.   On: 08/06/2018 22:53    Procedures   ____________________________________________   INITIAL IMPRESSION / ASSESSMENT AND PLAN / ED COURSE  As part of my medical decision making, I reviewed the following data within the electronic MEDICAL RECORD NUMBER  42 year old female presenting with  above-stated history and physical exam secondary to accidental fall.  X-rays revealed no evidence of fracture dislocation CT head unremarkable.  Patient symptoms consistent with possible concussion given prolonged symptoms since the event that continue at this point.  Patient prescribed Fioricet in the emergency department however she is concerned about the side effects of that and as such patient was given ibuprofen. ____________________________________________  FINAL CLINICAL IMPRESSION(S) / ED DIAGNOSES  Final diagnoses:  Concussion without loss of consciousness, initial encounter  Musculoskeletal pain     MEDICATIONS GIVEN DURING THIS VISIT:  Medications  butalbital-acetaminophen-caffeine (FIORICET, ESGIC) 50-325-40 MG per tablet 1 tablet (1 tablet Oral Refused 08/07/18 0206)     ED Discharge Orders    None       Note:  This document was prepared using Dragon voice recognition software and may include unintentional dictation errors.    Gregor Hams, MD 08/07/18 2214

## 2018-08-09 ENCOUNTER — Ambulatory Visit: Payer: Self-pay

## 2018-08-09 ENCOUNTER — Ambulatory Visit (INDEPENDENT_AMBULATORY_CARE_PROVIDER_SITE_OTHER): Payer: BLUE CROSS/BLUE SHIELD | Admitting: Family Medicine

## 2018-08-09 ENCOUNTER — Ambulatory Visit: Payer: BLUE CROSS/BLUE SHIELD | Admitting: Family Medicine

## 2018-08-09 VITALS — BP 150/92 | HR 87 | Temp 97.9°F | Ht 71.0 in

## 2018-08-09 DIAGNOSIS — M25512 Pain in left shoulder: Secondary | ICD-10-CM

## 2018-08-09 DIAGNOSIS — S060X0A Concussion without loss of consciousness, initial encounter: Secondary | ICD-10-CM

## 2018-08-09 DIAGNOSIS — M545 Low back pain, unspecified: Secondary | ICD-10-CM

## 2018-08-09 DIAGNOSIS — M79642 Pain in left hand: Secondary | ICD-10-CM | POA: Diagnosis not present

## 2018-08-09 DIAGNOSIS — M25552 Pain in left hip: Secondary | ICD-10-CM | POA: Diagnosis not present

## 2018-08-09 MED ORDER — NAPROXEN-ESOMEPRAZOLE 500-20 MG PO TBEC: 1.0000 | DELAYED_RELEASE_TABLET | Freq: Two times a day (BID) | ORAL | 3 refills | Status: DC

## 2018-08-09 NOTE — Telephone Encounter (Signed)
Sarah RN at [facility] called to see if pt could be scheduled for 15' appt with Dr Darnell Level today at 2:30 due to pt having hip pain. Pt seen at [facility] ED on 08/07/18 due to fall at [facility]. Dr Darnell Level said if pt needed to be seen today would need to go to UC with capability of xray if needed. Sarah voiced understanding and will cancel appt with Dr Darnell Level. FYI to Dr Darnell Level.

## 2018-08-09 NOTE — Telephone Encounter (Signed)
Patient being seen for acute problem at [facility] office today. FYI Dr. Raeford Razor

## 2018-08-09 NOTE — Patient Instructions (Signed)
Nice to meet you  Please try the medicine  Please try heat or ice over the areas of pain.  Please try melatonin for sleep and coQ10 and fish oil for concussion  Please try to perform light exercise  You can still use electronics but put them down if you become symptomatic  Please follow up in one week.  Happy New year.

## 2018-08-09 NOTE — Progress Notes (Signed)
[name] - 42 y.o. female MRN [id number]  Date of birth: 1976/02/11  SUBJECTIVE:  Including CC & ROS.  Chief Complaint  Patient presents with  . Arm Pain    left arm and hand  . Leg Pain    right back and leg     [name] is a 42 y.o. female that is presenting with pain on the left arm and hand as well as the right lower back and concussion-like symptoms.  She was evaluated on 12/20 in the emergency department.  Imaging at that time was all normal.  She has continued pain on the left side of her body.  The most significant pain is the right lower back.  She denies any sciatic type symptoms.  Left arm is painful and she is holding it in a guarded position.  She is provided Fioricet in the emergency department.  She has continued headache.  She feels like she is not like herself.  She denies any double vision..  Independent review of the left hand x-ray from 12/28 shows a normal exam.  Independent review of the hip x-ray from 12/27 shows stable postsurgical hardware.  Review of the CT head from 12/28 shows a normal exam.  Review of Systems  Constitutional: Negative for fever.  HENT: Negative for congestion.   Respiratory: Negative for cough.   Cardiovascular: Negative for chest pain.  Gastrointestinal: Negative for abdominal pain.  Musculoskeletal: Positive for back pain.  Skin: Negative for color change.  Neurological: Positive for weakness.  Hematological: Negative for adenopathy.  Psychiatric/Behavioral: Negative for agitation.    HISTORY: Past Medical, Surgical, Social, and Family History Reviewed & Updated per EMR.   Pertinent Historical Findings include:  Past Medical History:  Diagnosis Date  . Abdominal pain, epigastric 12/17/2015  . Anxiety    pt denies having dx of anxiety  . BCC (basal cell carcinoma of skin)    right foot   . Bunion, right foot   . Chronic cough   . Closed fibular fracture 10/2008   Left - Sustained 2/2 fall down stairs (same  fall as tibular fracture). S/P internal fixation.  . Duodenitis    EGD (07/14/2011) showing chronic duodenitis, H.pyloir neg  . Elevated liver enzymes   . Endometriosis    S/P TAH and salpingo-oophorectomy, right  . Epigastric pain    Chronic, thought 2/2 gastritis, EGD (07/14/2011) showing chronic duodenitis, H.pyloir neg // Repeat EGD (09/2011) - esophagealring, sessible polyp in stomach body, hiatal hernia - rec ad probiotic, await bx  . Esophageal ring    s/p dilatation during EGD (07/2011) - Dr. Oneida Alar  . Fatty liver   . Gastritis    Thought due to chronic NSAID use 2/2 pain from her congenital hip abnormality  . Headache(784.0)    Migraines  . Hidradenitis suppurativa 05/2006  . 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 With chronic OA of hip  . History of migraine headaches    Typical symptoms - bright lights, unilateral (starting behind her left ear), throbbing .  Marland Kitchen Hot flashes   . Hypertension   . Internal hemorrhoids   . Osteoarthritis of hip   . Ovarian cancer ([place]) 1995  . Protrusio acetabuli    diagnosed at age 47  . Sessile colonic polyp    noted 09/2011 -- > rec colonoscopy in 10 years, 2023  . Surgical menopause 08/2007   On HRT. Occuring since 01/09 following TAH  and R-salpingo-oophorectomy   . Tibial fracture 10/2008   Left - Sustained 2/2 fall down stairs. Patient is S/P closed internal fixation with Smith Nephew tibial nail locked proximal and distal  . Vitamin D deficiency 11/2009   Vit D level 15 in 11/2009    Past Surgical History:  Procedure Laterality Date  . ABDOMINAL HYSTERECTOMY    . CESAREAN SECTION  08/2005   Primary low transverse  . COLONOSCOPY  09/22/2011   [id number] polyp in the rectum/Internal hemorrhoids, benign path  . ESOPHAGOGASTRODUODENOSCOPY  09/22/2011   IHK:VQQV, esophageal/Sessile polyp in the body of the stomach/Hiatal hernia/ABDOMINAL PAIN LIKELY FUNCTIONAL V.  POST-INFECTIOUS IBS, path: mild chronic gastritis  . FRACTURE SURGERY     Right hip  . JOINT REPLACEMENT     right hip  . LAPAROSCOPY  10/2000   Operative laparoscopy with lysis of right adnexal adhesions and uterointestinal adhesions  . left tib fib    . OTHER SURGICAL HISTORY  02/2009   Closed treatment internal fixation, left tibia with Tamala Julian and Nephew tibial nail locked proximal and distal  . TOTAL ABDOMINAL HYSTERECTOMY W/ BILATERAL SALPINGOOPHORECTOMY  08/2007   for endometriosis. With concurrent right salpingo-oophorectomy  (2009), left oophorectomy (1995)  . TUBAL LIGATION  08/2005   Right tubal ligation    Allergies  Allergen Reactions  . Pineapple Shortness Of Breath and Swelling    Has Epi pen  . Hctz [Hydrochlorothiazide]     Headaches, foggy headed    . Latex Swelling  . Lisinopril     Cough   . Morphine And Related Itching  . Tape Swelling    Family History  Problem Relation Age of Onset  . Other Father        deceased while in service.   . Breast cancer Sister 46       remission  . Cancer Sister        breast  . Lupus Cousin   . Colon cancer Neg Hx   . Liver disease Neg Hx      Social History   Socioeconomic History  . Marital status: Married    Spouse name: Not on file  . Number of children: 2  . Years of education: bs degree  . Highest education level: Not on file  Occupational History  . 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 Financial resource strain: Not on file  . Food insecurity:    Worry: Not on file    Inability: Not on file  . Transportation needs:    Medical: Not on file    Non-medical: Not on file  Tobacco Use  . Smoking status: Never Smoker  . Smokeless tobacco: Never Used  Substance and Sexual Activity  . Alcohol use: No    Alcohol/week: 0.0 standard drinks  . Drug use: No  . Sexual activity: Not on file  Lifestyle  . Physical activity:    Days per week: Not on file    Minutes per  session: Not on file  . Stress: Not on file  Relationships  . Social connections:    Talks on phone: Not on file    Gets together: Not on file    Attends religious service: Not on file    Active member of club or organization: Not on file    Attends meetings of clubs or organizations: Not on file    Relationship status: Not on file  . Intimate partner violence:    Fear  of current or ex partner: Not on file    Emotionally abused: Not on file    Physically abused: Not on file    Forced sexual activity: Not on file  Other Topics Concern  . Not on file  Social History Narrative   Insurance: Medicare.   Patient is married with two children, [place] and [place].    She lives in [place] but children go to school in [place]:  VS: BP (!) 150/92   Pulse 87   Temp 97.9 F (36.6 C) (Oral)   Ht 5\' 11"  (1.803 m)   SpO2 97%   BMI 23.01 kg/m  Physical Exam Gen: NAD, alert, cooperative with exam, well-appearing ENT: normal lips, normal nasal mucosa,  Eye: normal EOM, normal conjunctiva and lids CV:  no edema, +2 pedal pulses   Resp: no accessory muscle use, non-labored,  Skin: no rashes, no areas of induration  Neuro: normal tone, normal sensation to touch, cranial nerves II through XII intact, significant reproduction of symptoms with horizontal and vertical gaze as well as saccades testing Psych:  normal insight, alert and oriented MSK:  Exam limited secondary to pain. Holding left arm in guarded position. Normal passive left shoulder ROM  Normal strength to resistance with IR and ER  Normal empty can testing  Left hip:  Normal IR and ER  Normal strength to resistance with hip flexion, knee flexion and extension  Normal SLR b/l  Back:  TTP in the right paraspinal muscles  No TTP over the midline lumbar spine  Neurovascularly intact      ASSESSMENT & PLAN:   Concussion with no loss of consciousness Symptoms are suggestive of concussion  with significant trouble with visual testing today. Also doesn't feel like herself and trouble concentrating  - counseled on supportive care - counseled on symptoms and symptoms management.  - f/u in one week.   Acute right-sided low back pain without sciatica Related to fall. Likely muscle in nature.  - provided samples of vimovo  - sent vimovo  - counseled on supportive care   Acute pain of left shoulder I don't appreciated a tear but exam limited - vimovo  - counseled on supportive - can scan with [place] on follow up if needed. Consider injection.

## 2018-08-09 NOTE — Telephone Encounter (Signed)
Pt c/o left hip pain after falling 08/06/18. Pt stated the pain was a "pulling pain". Pt rates pain as moderate. Pt stated pain is constant.  Pt also c/o stabbing pain to her right side. Pt is a pt at [facility] but no appointments available. Called [facility] and spoke with Sam who stated to call [facility] to see Clearance Coots. Called [facility] and spoke with Edd Arbour who ok'd appointment for pt to see Dr Raeford Razor today for the acute hip pain. Hospital f/u scheduled with PCP> Reason for Disposition . [1] MODERATE pain (e.g., interferes with normal activities, limping) AND [2] present > 3 days  Answer Assessment - Initial Assessment Questions 1. LOCATION and RADIATION: "Where is the pain located?"      Left hip and right side 2. QUALITY: "What does the pain feel like?"  (e.g., sharp, dull, aching, burning)     Stabbing pain to the right left hip feels like a pulling pain 3. SEVERITY: "How bad is the pain?" "What does it keep you from doing?"   (Scale 1-10; or mild, moderate, severe)   -  MILD (1-3): doesn't interfere with normal activities    -  MODERATE (4-7): interferes with normal activities (e.g., work or school) or awakens from sleep, limping    -  SEVERE (8-10): excruciating pain, unable to do any normal activities, unable to walk     Moderate- pulling sensation at the hip joint 4. ONSET: "When did the pain start?" "Does it come and go, or is it there all the time?"     After the fall-all the time  Right side pain comes and goes - left hips constant 5. WORK OR EXERCISE: "Has there been any recent work or exercise that involved this part of the body?"      Pt had a fall 6. CAUSE: "What do you think is causing the hip pain?"      fall 7. AGGRAVATING FACTORS: "What makes the hip pain worse?" (e.g., walking, climbing stairs, running)     Sitting and lying and walking 8. OTHER SYMPTOMS: "Do you have any other symptoms?" (e.g., back pain, pain shooting down leg,  fever, rash)     Back pain  to the right side sharp pain from the front to the back  Protocols used: HIP PAIN-A-AH

## 2018-08-10 NOTE — Telephone Encounter (Signed)
Noted  TMS 

## 2018-08-12 DIAGNOSIS — M25512 Pain in left shoulder: Secondary | ICD-10-CM | POA: Insufficient documentation

## 2018-08-12 DIAGNOSIS — S060X0A Concussion without loss of consciousness, initial encounter: Secondary | ICD-10-CM | POA: Insufficient documentation

## 2018-08-12 DIAGNOSIS — M545 Low back pain, unspecified: Secondary | ICD-10-CM | POA: Insufficient documentation

## 2018-08-12 NOTE — Assessment & Plan Note (Signed)
Related to fall. Likely muscle in nature.  - provided samples of vimovo  - sent vimovo  - counseled on supportive care

## 2018-08-12 NOTE — Assessment & Plan Note (Signed)
Symptoms are suggestive of concussion with significant trouble with visual testing today. Also doesn't feel like herself and trouble concentrating  - counseled on supportive care - counseled on symptoms and symptoms management.  - f/u in one week.

## 2018-08-12 NOTE — Assessment & Plan Note (Signed)
I don't appreciated a tear but exam limited - vimovo  - counseled on supportive - can scan with [place] on follow up if needed. Consider injection.

## 2018-08-16 DIAGNOSIS — F331 Major depressive disorder, recurrent, moderate: Secondary | ICD-10-CM | POA: Diagnosis not present

## 2018-08-18 ENCOUNTER — Encounter: Payer: Self-pay | Admitting: Internal Medicine

## 2018-08-18 ENCOUNTER — Ambulatory Visit (INDEPENDENT_AMBULATORY_CARE_PROVIDER_SITE_OTHER): Payer: BLUE CROSS/BLUE SHIELD | Admitting: Internal Medicine

## 2018-08-18 ENCOUNTER — Ambulatory Visit (INDEPENDENT_AMBULATORY_CARE_PROVIDER_SITE_OTHER): Payer: BLUE CROSS/BLUE SHIELD

## 2018-08-18 VITALS — BP 142/82 | HR 67 | Temp 97.6°F | Ht 71.0 in | Wt 192.8 lb

## 2018-08-18 DIAGNOSIS — M25521 Pain in right elbow: Secondary | ICD-10-CM

## 2018-08-18 DIAGNOSIS — R519 Headache, unspecified: Secondary | ICD-10-CM

## 2018-08-18 DIAGNOSIS — M25522 Pain in left elbow: Secondary | ICD-10-CM

## 2018-08-18 DIAGNOSIS — I10 Essential (primary) hypertension: Secondary | ICD-10-CM | POA: Diagnosis not present

## 2018-08-18 DIAGNOSIS — S59912A Unspecified injury of left forearm, initial encounter: Secondary | ICD-10-CM | POA: Diagnosis not present

## 2018-08-18 DIAGNOSIS — M25552 Pain in left hip: Secondary | ICD-10-CM

## 2018-08-18 DIAGNOSIS — M25572 Pain in left ankle and joints of left foot: Secondary | ICD-10-CM | POA: Diagnosis not present

## 2018-08-18 DIAGNOSIS — S59901A Unspecified injury of right elbow, initial encounter: Secondary | ICD-10-CM | POA: Diagnosis not present

## 2018-08-18 DIAGNOSIS — R51 Headache: Secondary | ICD-10-CM

## 2018-08-18 DIAGNOSIS — S99912A Unspecified injury of left ankle, initial encounter: Secondary | ICD-10-CM | POA: Diagnosis not present

## 2018-08-18 MED ORDER — KETOROLAC TROMETHAMINE 60 MG/2ML IM SOLN
30.0000 mg | Freq: Once | INTRAMUSCULAR | Status: AC
  Administered 2018-08-18: 30 mg via INTRAMUSCULAR

## 2018-08-18 MED ORDER — METHYLPREDNISOLONE ACETATE 40 MG/ML IJ SUSP
40.0000 mg | Freq: Once | INTRAMUSCULAR | Status: AC
  Administered 2018-08-18: 40 mg via INTRAMUSCULAR

## 2018-08-18 NOTE — Progress Notes (Signed)
Chief Complaint  Patient presents with  . Follow-up    Pt had a fall on 08/06/18, seen in ED. Pt struck her head when she fell and has been having constant headaches since. Some blurred vision and visual disturbance; nausea, denies vomiting. Not feeling well. Pt also injured her R elbow, L ankle and L wrist in the fall. Pt also c/o some shivering - not sure if from pain/HA or if coming down with something.   Fall 08/06/18 in [place] slipped on french fry and some grease on the floor going to the bathroom and hit her head family was in the car. Since fall had h/a and blurry vision CT head negative 08/07/18. She is s/p left ankle surgery years ago and b/l hip surgery and c/o 8/10 pain in right elbow and left elbow and left ankle pain both sides and left hip pain and wrist pain. She is in hand splint/cast today Xray of left hand negative. She has tried Tylenol and otc NSAIDS and afraid to try anything else   HTN slightly elevated today likely 2/2 pain on norvasc 10 mg qd   Review of Systems  Constitutional: Negative for weight loss.  HENT: Negative for hearing loss.   Eyes: Positive for blurred vision.  Respiratory: Negative for shortness of breath.   Cardiovascular: Negative for chest pain.  Gastrointestinal: Negative for nausea and vomiting.  Musculoskeletal: Positive for back pain, falls and joint pain.  Skin: Negative for rash.  Neurological: Positive for headaches.  Psychiatric/Behavioral: Negative for depression.   Past Medical History:  Diagnosis Date  . Abdominal pain, epigastric 12/17/2015  . Anxiety    pt denies having dx of anxiety  . BCC (basal cell carcinoma of skin)    right foot   . Bunion, right foot   . Chronic cough   . Closed fibular fracture 10/2008   Left - Sustained 2/2 fall down stairs (same fall as tibular fracture). S/P internal fixation.  . Duodenitis    EGD (07/14/2011) showing chronic duodenitis, H.pyloir neg  . Elevated liver enzymes   . Endometriosis    S/P  TAH and salpingo-oophorectomy, right  . Epigastric pain    Chronic, thought 2/2 gastritis, EGD (07/14/2011) showing chronic duodenitis, H.pyloir neg // Repeat EGD (09/2011) - esophagealring, sessible polyp in stomach body, hiatal hernia - rec ad probiotic, await bx  . Esophageal ring    s/p dilatation during EGD (07/2011) - Dr. Oneida Alar  . Fatty liver   . Gastritis    Thought due to chronic NSAID use 2/2 pain from her congenital hip abnormality  . Headache(784.0)    Migraines  . Hidradenitis suppurativa 05/2006  . 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 With chronic OA of hip  . History of migraine headaches    Typical symptoms - bright lights, unilateral (starting behind her left ear), throbbing .  Marland Kitchen Hot flashes   . Hypertension   . Internal hemorrhoids   . Osteoarthritis of hip   . Ovarian cancer ([place]) 1995  . Protrusio acetabuli    diagnosed at age 69  . Sessile colonic polyp    noted 09/2011 -- > rec colonoscopy in 10 years, 2023  . Surgical menopause 08/2007   On HRT. Occuring since 01/09 following TAH and R-salpingo-oophorectomy   . Tibial fracture 10/2008   Left - Sustained 2/2 fall down stairs. Patient is S/P closed internal fixation with Smith Nephew tibial nail locked proximal and distal  .  Vitamin D deficiency 11/2009   Vit D level 15 in 11/2009   Past Surgical History:  Procedure Laterality Date  . ABDOMINAL HYSTERECTOMY    . CESAREAN SECTION  08/2005   Primary low transverse  . COLONOSCOPY  09/22/2011   CLE:XNTZGYF polyp in the rectum/Internal hemorrhoids, benign path  . ESOPHAGOGASTRODUODENOSCOPY  09/22/2011   VCB:SWHQ, esophageal/Sessile polyp in the body of the stomach/Hiatal hernia/ABDOMINAL PAIN LIKELY FUNCTIONAL V. POST-INFECTIOUS IBS, path: mild chronic gastritis  . FRACTURE SURGERY     Right hip  . JOINT REPLACEMENT     right hip  . LAPAROSCOPY  10/2000   Operative laparoscopy with lysis of  right adnexal adhesions and uterointestinal adhesions  . left tib fib    . OTHER SURGICAL HISTORY  02/2009   Closed treatment internal fixation, left tibia with Tamala Julian and Nephew tibial nail locked proximal and distal  . TOTAL ABDOMINAL HYSTERECTOMY W/ BILATERAL SALPINGOOPHORECTOMY  08/2007   for endometriosis. With concurrent right salpingo-oophorectomy  (2009), left oophorectomy (1995)  . TUBAL LIGATION  08/2005   Right tubal ligation   Family History  Problem Relation Age of Onset  . Other Father        deceased while in service.   . Breast cancer Sister 59       remission  . Cancer Sister        breast  . Lupus Cousin   . Colon cancer Neg Hx   . Liver disease Neg Hx    Social History   Socioeconomic History  . Marital status: Married    Spouse name: Not on file  . Number of children: 2  . Years of education: bs degree  . Highest education level: Not on file  Occupational History  . 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 Financial resource strain: Not on file  . Food insecurity:    Worry: Not on file    Inability: Not on file  . Transportation needs:    Medical: Not on file    Non-medical: Not on file  Tobacco Use  . Smoking status: Never Smoker  . Smokeless tobacco: Never Used  Substance and Sexual Activity  . Alcohol use: No    Alcohol/week: 0.0 standard drinks  . Drug use: No  . Sexual activity: Not on file  Lifestyle  . Physical activity:    Days per week: Not on file    Minutes per session: Not on file  . Stress: Not on file  Relationships  . Social connections:    Talks on phone: Not on file    Gets together: Not on file    Attends religious service: Not on file    Active member of club or organization: Not on file    Attends meetings of clubs or organizations: Not on file    Relationship status: Not on file  . Intimate partner violence:    Fear of current or ex partner: Not on file    Emotionally abused: Not on  file    Physically abused: Not on file    Forced sexual activity: Not on file  Other Topics Concern  . Not on file  Social History Narrative   Insurance: Medicare.   Patient is married with two children, [place] and [place].    She lives in [place] but children go to school in [place]  . amLODipine (NORVASC) 10 MG tablet Take  1 tablet (10 mg total) by mouth daily.  . baclofen (LIORESAL) 10 MG tablet Take 1 tablet (10 mg total) by mouth 2 (two) times daily as needed for muscle spasms. Or 2 tablets at night  . cloNIDine (CATAPRES) 0.1 MG tablet Take 0.05 mg by mouth 2 (two) times daily.  . fluticasone (FLONASE) 50 MCG/ACT nasal spray Place 1-2 sprays into both nostrils daily. Prn  . ibuprofen (ADVIL,MOTRIN) 600 MG tablet Take 1 tablet (600 mg total) by mouth every 6 (six) hours as needed.  . Naproxen-Esomeprazole 500-20 MG TBEC Take 1 tablet by mouth 2 (two) times daily.   Allergies  Allergen Reactions  . Pineapple Shortness Of Breath and Swelling    Has Epi pen  . Hctz [Hydrochlorothiazide]     Headaches, foggy headed    . Latex Swelling  . Lisinopril     Cough   . Morphine And Related Itching  . Tape Swelling   Recent Results (from the past 2160 hour(s))  Comprehensive metabolic panel     Status: Abnormal   Collection Time: 05/25/18  8:03 AM  Result Value Ref Range   Glucose, Bld 89 65 - 99 mg/dL    Comment: .            Fasting reference interval .    BUN 8 7 - 25 mg/dL   Creat 0.83 0.50 - 1.10 mg/dL   BUN/Creatinine Ratio NOT APPLICABLE 6 - 22 (calc)   Sodium 139 135 - 146 mmol/L   Potassium 3.6 3.5 - 5.3 mmol/L   Chloride 103 98 - 110 mmol/L   CO2 26 20 - 32 mmol/L   Calcium 9.4 8.6 - 10.2 mg/dL   Total Protein 7.3 6.1 - 8.1 g/dL   Albumin 4.1 3.6 - 5.1 g/dL   Globulin 3.2 1.9 - 3.7 g/dL (calc)   AG Ratio 1.3 1.0 - 2.5 (calc)   Total Bilirubin 1.1 0.2 - 1.2 mg/dL   Alkaline phosphatase (APISO) 58 33 - 115 U/L    AST 31 (H) 10 - 30 U/L   ALT 33 (H) 6 - 29 U/L  Iron, TIBC and Ferritin Panel     Status: None   Collection Time: 05/25/18  8:03 AM  Result Value Ref Range   Iron 90 40 - 190 mcg/dL   TIBC 368 250 - 450 mcg/dL (calc)   %SAT 24 16 - 45 % (calc)   Ferritin 117 16 - 232 ng/mL  Measles/Mumps/Rubella Immunity     Status: None   Collection Time: 05/25/18  8:03 AM  Result Value Ref Range   Rubeola IgG 128.00 AU/mL    Comment: AU/mL            Interpretation -----            -------------- <25.00           Negative 25.00-29.99      Equivocal >29.99           Positive . A positive result indicates that the patient has antibody to measles virus. It does not differentiate  between an active or past infection. The clinical  diagnosis must be interpreted in conjunction with  clinical signs and symptoms of the patient.    Mumps IgG 86.00 AU/mL    Comment:  AU/mL           Interpretation -------         ---------------- <9.00             Negative  9.00-10.99        Equivocal >10.99            Positive A positive result indicates that the patient has  antibody to mumps virus. It does not differentiate between an  active or past infection. The clinical diagnosis must be interpreted in conjunction with clinical signs and symptoms of the patient. .    Rubella 8.67 index    Comment:     Index            Interpretation     -----            --------------       <0.90            Not consistent with Immunity     0.90-0.99        Equivocal     > or = 1.00      Consistent with Immunity  . The presence of rubella IgG antibody suggests  immunization or past or current infection with rubella virus.   B12     Status: None   Collection Time: 05/25/18  8:03 AM  Result Value Ref Range   Vitamin B-12 410 200 - 1,100 pg/mL  Vitamin D (25 hydroxy)     Status: None   Collection Time: 05/25/18  8:03 AM  Result Value Ref Range   Vit D, 25-Hydroxy 31 30 - 100 ng/mL    Comment: Vitamin D Status          25-OH Vitamin D: . Deficiency:                    <20 ng/mL Insufficiency:             20 - 29 ng/mL Optimal:                 > or = 30 ng/mL . For 25-OH Vitamin D testing on patients on  D2-supplementation and patients for whom quantitation  of D2 and D3 fractions is required, the QuestAssureD(TM) 25-OH VIT D, (D2,D3), LC/MS/MS is recommended: order  code [phone] (patients >48yrs). . For more information on this test, go to: http://education.quest[facility].com/faq/FAQ163 (This link is being provided for  informational/educational purposes only.)   T4, free     Status: None   Collection Time: 05/25/18  8:03 AM  Result Value Ref Range   Free T4 1.0 0.8 - 1.8 ng/dL  TSH     Status: None   Collection Time: 05/25/18  8:03 AM  Result Value Ref Range   TSH 2.23 mIU/L    Comment:           Reference Range .           > or = 20 Years  0.40-4.50 .                Pregnancy Ranges           First trimester    0.26-2.66           Second trimester   0.55-2.73           Third trimester    0.43-2.91   Lipid panel     Status: None   Collection Time: 05/25/18  8:03 AM  Result Value Ref Range   Cholesterol 159 <200 mg/dL   HDL 59 >50 mg/dL   Triglycerides 74 <150 mg/dL   LDL Cholesterol (Calc) 84 mg/dL (calc)    Comment: Reference range: <100 . Desirable range <100 mg/dL  for primary prevention;   <70 mg/dL for patients with CHD or diabetic patients  with > or = 2 CHD risk factors. Marland Kitchen LDL-C is now calculated using the Martin-Hopkins  calculation, which is a validated novel method providing  better accuracy than the Friedewald equation in the  estimation of LDL-C.  Cresenciano Genre et al. Annamaria Helling. 5638;756(43): 2061-2068  (http://education.[facility].com/faq/FAQ164)    Total CHOL/HDL Ratio 2.7 <5.0 (calc)   Non-HDL Cholesterol (Calc) 100 <130 mg/dL (calc)    Comment: For patients with diabetes plus 1 major ASCVD risk  factor, treating to a non-HDL-C goal of <100 mg/dL  (LDL-C of <70  mg/dL) is considered a therapeutic  option.   CBC with Differential/Platelet     Status: None   Collection Time: 05/25/18  8:03 AM  Result Value Ref Range   WBC 7.3 3.8 - 10.8 Thousand/uL   RBC 4.43 3.80 - 5.10 Million/uL   Hemoglobin 12.1 11.7 - 15.5 g/dL   HCT 35.7 35.0 - 45.0 %   MCV 80.6 80.0 - 100.0 fL   MCH 27.3 27.0 - 33.0 pg   MCHC 33.9 32.0 - 36.0 g/dL   RDW 13.3 11.0 - 15.0 %   Platelets 316 140 - 400 Thousand/uL   MPV 10.8 7.5 - 12.5 fL   Neutro Abs 4,723 1,500 - 7,800 cells/uL   Lymphs Abs 1,840 850 - 3,900 cells/uL   WBC mixed population 599 200 - 950 cells/uL   Eosinophils Absolute 110 15 - 500 cells/uL   Basophils Absolute 29 0 - 200 cells/uL   Neutrophils Relative % 64.7 %   Total Lymphocyte 25.2 %   Monocytes Relative 8.2 %   Eosinophils Relative 1.5 %   Basophils Relative 0.4 %  HM PAP SMEAR     Status: None   Collection Time: 05/26/18 12:00 AM  Result Value Ref Range   HM Pap smear normal     Comment: normal neg +fungus, no HPV testing done Dr. Luan Pulling   HM MAMMOGRAPHY     Status: None   Collection Time: 06/18/18 12:00 AM  Result Value Ref Range   HM Mammogram 0-4 Bi-Rad 0-4 Bi-Rad, Self Reported Normal    Comment: [facility] negative 06/20/18   Urinalysis, Routine w reflex microscopic     Status: None   Collection Time: 07/01/18  9:44 AM  Result Value Ref Range   Color, Urine YELLOW YELLOW   APPearance CLEAR CLEAR   Specific Gravity, Urine 1.019 1.001 - 1.03   pH 7.0 5.0 - 8.0   Glucose, UA NEGATIVE NEGATIVE   Bilirubin Urine NEGATIVE NEGATIVE   Ketones, ur NEGATIVE NEGATIVE   Hgb urine dipstick NEGATIVE NEGATIVE   Protein, ur NEGATIVE NEGATIVE   Nitrite NEGATIVE NEGATIVE   Leukocytes, UA NEGATIVE NEGATIVE   Objective  Body mass index is 26.89 kg/m. Wt Readings from Last 3 Encounters:  08/18/18 192 lb 12.8 oz (87.5 kg)  08/06/18 165 lb (74.8 kg)  08/05/18 187 lb 9.6 oz (85.1 kg)   Temp Readings from Last 3 Encounters:  08/18/18 97.6 F (36.4  C) (Oral)  08/09/18 97.9 F (36.6 C) (Oral)  08/06/18 98.2 F (36.8 C) (Oral)   BP Readings from Last 3 Encounters:  08/18/18 (!) 142/82  08/09/18 (!) 150/92  08/07/18 129/83   Pulse Readings from Last 3 Encounters:  08/18/18 67  08/09/18 87  08/07/18 (!) 51    Physical Exam Vitals signs and nursing note reviewed.  Constitutional:      Appearance: Normal appearance.  She is well-developed.  HENT:     Head: Normocephalic and atraumatic.     Mouth/Throat:     Mouth: Mucous membranes are moist.     Pharynx: Oropharynx is clear.  Eyes:     Conjunctiva/sclera: Conjunctivae normal.     Pupils: Pupils are equal, round, and reactive to light.  Cardiovascular:     Rate and Rhythm: Normal rate and regular rhythm.     Heart sounds: Normal heart sounds.  Pulmonary:     Effort: Pulmonary effort is normal.     Breath sounds: Normal breath sounds.  Musculoskeletal:     Right elbow: Tenderness found. Medial epicondyle, lateral epicondyle and olecranon process tenderness noted.     Left elbow: Tenderness found. Medial epicondyle, lateral epicondyle and olecranon process tenderness noted.     Left hip: She exhibits tenderness.     Left ankle: She exhibits normal range of motion. Tenderness. Lateral malleolus and medial malleolus tenderness found.  Skin:    General: Skin is warm and dry.  Neurological:     General: No focal deficit present.     Mental Status: She is alert and oriented to person, place, and time.     Gait: Gait normal.  Psychiatric:        Attention and Perception: Attention and perception normal.        Mood and Affect: Mood and affect normal.        Speech: Speech normal.        Behavior: Behavior normal. Behavior is cooperative.        Thought Content: Thought content normal.        Cognition and Memory: Cognition and memory normal.        Judgment: Judgment normal.     Assessment   1. S/p fall 08/06/18 with CT head normal 08/07/18 with c/o h/a, left hip,  left ankle, b/l elbow pain and left wrist pain  2. HTN 3. HM Plan  1. Xray b/l elbows today and ankle left MRI left hip needs open in [place]  Refer back to Dr. [facility] in [phone] fax [phone]  toradol 30 and depomedrol 40 today x 1  If h/as continue refer neurology in [place]  2. Cont norvasc 10 mg qd  3.  Declines flu shot for now will think about it Hep B immune, consider hep A vaccineh/o fatty liver Tdap up to date10/26/17 pna 23 had 02/19/09 HCV neg 06/05/16  HIV neg 06/05/16 mammo 06/20/18 neg  Get copy of pap [facility] [place] saw 2019 s/p hysterectomy  Provider: Dr. Olivia Mackie McLean-Scoc[facility]

## 2018-08-18 NOTE — Patient Instructions (Addendum)
Call me back if headaches dont get better we will refer you to [facility] in [facility]     Ankle Pain The ankle joint holds your body weight and allows you to move around. Ankle pain can occur on either side or the back of one ankle or both ankles. Ankle pain may be sharp and burning or dull and aching. There may be tenderness, stiffness, redness, or warmth around the ankle. Many things can cause ankle pain, including an injury to the area and overuse of the ankle. Follow these instructions at home: Activity  Rest your ankle as told by your health care provider. Avoid any activities that cause ankle pain.  Do not use the injured limb to support your body weight until your health care provider says that you can. Use crutches as told by your health care provider.  Do exercises as told by your health care provider.  Ask your health care provider when it is safe to drive if you have a brace on your ankle. If you have a brace:  Wear the brace as told by your health care provider. Remove it only as told by your health care provider.  Loosen the brace if your toes tingle, become numb, or turn cold and blue.  Keep the brace clean.  If the brace is not waterproof: ? Do not let it get wet. ? Cover it with a watertight covering when you take a bath or shower. If you were given an elastic bandage:   Remove it when you take a bath or a shower.  Try not to move your ankle very much, but wiggle your toes from time to time. This helps to prevent swelling.  Adjust the bandage to make it more comfortable if it feels too tight.  Loosen the bandage if you have numbness or tingling in your foot or if your foot turns cold and blue. Managing pain, stiffness, and swelling   If directed, put ice on the painful area. ? If you have a removable brace or elastic bandage, remove it as told by your health care provider. ? Put ice in a plastic bag. ? Place a towel between your skin and the  bag. ? Leave the ice on for 20 minutes, 2-3 times a day.  Move your toes often to avoid stiffness and to lessen swelling.  Raise (elevate) your ankle above the level of your heart while you are sitting or lying down. General instructions  Record information about your pain. Writing down the following may be helpful for you and your health care provider: ? How often you have ankle pain. ? Where the pain is located. ? What the pain feels like.  If treatment involves wearing a prescribed shoe or insole, make sure you wear it correctly and for as long as told by your health care provider.  Take over-the-counter and prescription medicines only as told by your health care provider.  Keep all follow-up visits as told by your health care provider. This is important. Contact a health care provider if:  Your pain gets worse.  Your pain is not relieved with medicines.  You have a fever or chills.  You are having more trouble with walking.  You have new symptoms. Get help right away if:  Your foot, leg, toes, or ankle: ? Tingles or becomes numb. ? Becomes swollen. ? Turns pale or blue. Summary  Ankle pain can occur on either side or the back of one ankle or both ankles.  Ankle  pain may be sharp and burning or dull and aching.  Rest your ankle as told by your health care provider. If told, apply ice to the area.  Take over-the-counter and prescription medicines only as told by your health care provider. This information is not intended to replace advice given to you by your health care provider. Make sure you discuss any questions you have with your health care provider. Document Released: 01/15/2010 Document Revised: 02/03/2018 Document Reviewed: 02/03/2018 Elsevier Interactive Patient Education  2019 [employer]  Hip Pain  The hip is the joint between the upper legs and the lower pelvis. The bones, cartilage, tendons, and muscles of your hip joint support your body and allow  you to move around. Hip pain can range from a minor ache to severe pain in one or both of your hips. The pain may be felt on the inside of the hip joint near the groin, or the outside near the buttocks and upper thigh. You may also have swelling or stiffness. Follow these instructions at home: Managing pain, stiffness, and swelling  If directed, apply ice to the injured area. ? Put ice in a plastic bag. ? Place a towel between your skin and the bag. ? Leave the ice on for 20 minutes, 2-3 times a day  Sleep with a pillow between your legs on your most comfortable side.  Avoid any activities that cause pain. General instructions  Take over-the-counter and prescription medicines only as told by your health care provider.  Do any exercises as told by your health care provider.  Record the following: ? How often you have hip pain. ? The location of your pain. ? What the pain feels like. ? What makes the pain worse.  Keep all follow-up visits as told by your health care provider. This is important. Contact a health care provider if:  You cannot put weight on your leg.  Your pain or swelling continues or gets worse after one week.  It gets harder to walk.  You have a fever. Get help right away if:  You fall.  You have a sudden increase in pain and swelling in your hip.  Your hip is red or swollen or very tender to touch. Summary  Hip pain can range from a minor ache to severe pain in one or both of your hips.  The pain may be felt on the inside of the hip joint near the groin, or the outside near the buttocks and upper thigh.  Avoid any activities that cause pain.  Record how often you have hip pain, the location of the pain, what makes it worse and what it feels like. This information is not intended to replace advice given to you by your health care provider. Make sure you discuss any questions you have with your health care provider. Document Released: 01/15/2010  Document Revised: 06/30/2016 Document Reviewed: 06/30/2016 Elsevier Interactive Patient Education  2019 [employer].

## 2018-08-19 ENCOUNTER — Ambulatory Visit: Payer: Self-pay | Admitting: *Deleted

## 2018-08-19 NOTE — Telephone Encounter (Signed)
Pt reports dizziness, headache, nausea(no vomiting) and "Fever" with chills and diaphoresis; vision blurry.   States H/O fall with concussion 08/06/18. Has had all these symptoms "Off and on since" but worsening since yesterday. Saw Dr. Aundra Dubin yesterday, Toradol and steroidal injection given; elbow, hip and ankle pain, headache.  Pt states "Headache better for a while, but came back." States dizziness positional, worse with movement, feels unsteady walking. Vision remains blurry, room spins at times. States BP today 137/82.  Pt does not thermometer but states "Feel feverish."  [place] called practice and spoke with Judson Roch. No availability with providers today. Pt directed to [facility]; states will follow disposition, will call on relative to drive.  Reason for Disposition . [1] Dizziness caused by heat exposure, sudden standing, or poor fluid intake AND [2] no improvement after 2 hours of rest and fluids    H/O concussion; "Worsening symptoms"  Answer Assessment - Initial Assessment Questions 1. DESCRIPTION: "Describe your dizziness."     "Floaty" Spins at times 2. LIGHTHEADED: "Do you feel lightheaded?" (e.g., somewhat faint, woozy, weak upon standing)     yes 3. VERTIGO: "Do you feel like either you or the room is spinning or tilting?" (i.e. vertigo)     Spinning at times, worse with movement 4. SEVERITY: "How bad is it?"  "Do you feel like you are going to faint?" "Can you stand and walk?"   - MILD - walking normally   - MODERATE - interferes with normal activities (e.g., work, school)    - SEVERE - unable to stand, requires support to walk, feels like passing out now.      Have to sit back down, feels unsteady walking 5. ONSET:  "When did the dizziness begin?"      Since concussion, worsening yesterday 6. AGGRAVATING FACTORS: "Does anything make it worse?" (e.g., standing, change in head position)    Positional, movement 7. HEART RATE: "Can you tell me your heart rate?" "How many beats in  15 seconds?"  (Note: not all patients can do this)       no 8. CAUSE: "What do you think is causing the dizziness?"     My head injury 9. RECURRENT SYMPTOM: "Have you had dizziness before?" If so, ask: "When was the last time?" "What happened that time?"     Off and on, worse since yesterday 10. OTHER SYMPTOMS: "Do you have any other symptoms?" (e.g., fever, chest pain, vomiting, diarrhea, bleeding)      Nausea, no vomiting, "Fever"  Chills and sweating, vision blurred.  Protocols used: DIZZINESS [facility]

## 2018-08-19 NOTE — Telephone Encounter (Signed)
Please refer neurology referral from 12/2017  H/a and dizziness blurry vision after fall and h/o photophobia   Thanks tMS

## 2018-08-25 ENCOUNTER — Telehealth: Payer: Self-pay | Admitting: Family Medicine

## 2018-08-25 ENCOUNTER — Telehealth: Payer: Self-pay

## 2018-08-25 ENCOUNTER — Telehealth: Payer: Self-pay | Admitting: Internal Medicine

## 2018-08-25 NOTE — Telephone Encounter (Signed)
Copied from [place] [phone]. Topic: Referral - Question >> Aug 25, 2018  2:40 PM [name] wrote: Reason for CRM: [name] from [place] called due to Pts insurance [employer] requiring authorization for MRI / please call ellen or advise

## 2018-08-25 NOTE — Telephone Encounter (Deleted)
Copied from [place] [phone]. Topic: Quick Communication - See Telephone Encounter >> Aug 25, 2018  5:41 PM Valla Leaver wrote: CRM for notification. See Telephone encounter for: 08/25/18. Pharmacy calling to initiate PA for Naproxen-Esomeprazole 500-20 MG TBEC. Needs

## 2018-08-25 NOTE — Telephone Encounter (Signed)
Copied from [place] [phone]. Topic: Quick Communication - See Telephone Encounter >> Aug 25, 2018  5:41 PM Valla Leaver wrote: CRM for notification. See Telephone encounter for: 08/25/18. Pharmacy calling to initiate PA for Naproxen-Esomeprazole 500-20 MG TBEC. Needs diagnosis, whether other medication was tried and failed, if its used for arthritis pain, if diagnosis affects the knees, if it will be a temporary use or continuation of therapy?

## 2018-08-25 NOTE — Telephone Encounter (Signed)
error 

## 2018-08-28 ENCOUNTER — Other Ambulatory Visit: Payer: BLUE CROSS/BLUE SHIELD

## 2018-08-30 NOTE — Telephone Encounter (Signed)
[name] Key: [id number]   PA Started waiting for response.

## 2018-09-10 ENCOUNTER — Ambulatory Visit (INDEPENDENT_AMBULATORY_CARE_PROVIDER_SITE_OTHER): Payer: BLUE CROSS/BLUE SHIELD | Admitting: Internal Medicine

## 2018-09-10 ENCOUNTER — Encounter: Payer: Self-pay | Admitting: Internal Medicine

## 2018-09-10 VITALS — BP 130/82 | HR 67 | Temp 98.2°F | Ht 71.0 in | Wt 189.0 lb

## 2018-09-10 DIAGNOSIS — Z20828 Contact with and (suspected) exposure to other viral communicable diseases: Secondary | ICD-10-CM | POA: Diagnosis not present

## 2018-09-10 MED ORDER — OSELTAMIVIR PHOSPHATE 75 MG PO CAPS: 75.0000 mg | ORAL_CAPSULE | Freq: Two times a day (BID) | ORAL | 0 refills | Status: DC

## 2018-09-10 MED ORDER — AZITHROMYCIN 250 MG PO TABS: ORAL_TABLET | ORAL | 0 refills | Status: DC

## 2018-09-10 NOTE — Progress Notes (Signed)
Chief Complaint  Patient presents with  . Follow-up   C/o night sweats dry cough since weds no fever, no sore throat, +nasal congestion son and daughter sick with +flu  Nothing tried   Review of Systems  Constitutional: Positive for malaise/fatigue. Negative for weight loss.  HENT: Positive for congestion. Negative for hearing loss.   Eyes: Negative for blurred vision.  Respiratory: Positive for cough. Negative for sputum production and shortness of breath.   Cardiovascular: Negative for chest pain.  Gastrointestinal: Negative for abdominal pain.  Skin: Negative for rash.  Neurological: Negative for headaches.  Psychiatric/Behavioral: Negative for depression.   Past Medical History:  Diagnosis Date  . Abdominal pain, epigastric 12/17/2015  . Anxiety    pt denies having dx of anxiety  . BCC (basal cell carcinoma of skin)    right foot   . Bunion, right foot   . Chronic cough   . Closed fibular fracture 10/2008   Left - Sustained 2/2 fall down stairs (same fall as tibular fracture). S/P internal fixation.  . Duodenitis    EGD (07/14/2011) showing chronic duodenitis, H.pyloir neg  . Elevated liver enzymes   . Endometriosis    S/P TAH and salpingo-oophorectomy, right  . Epigastric pain    Chronic, thought 2/2 gastritis, EGD (07/14/2011) showing chronic duodenitis, H.pyloir neg // Repeat EGD (09/2011) - esophagealring, sessible polyp in stomach body, hiatal hernia - rec ad probiotic, await bx  . Esophageal ring    s/p dilatation during EGD (07/2011) - Dr. Oneida Alar  . Fatty liver   . Gastritis    Thought due to chronic NSAID use 2/2 pain from her congenital hip abnormality  . Headache(784.0)    Migraines  . Hidradenitis suppurativa 05/2006  . 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 With chronic OA of hip  . History of migraine headaches    Typical symptoms - bright lights, unilateral (starting behind her left  ear), throbbing .  Marland Kitchen Hot flashes   . Hypertension   . Internal hemorrhoids   . Osteoarthritis of hip   . Ovarian cancer ([place]) 1995  . Protrusio acetabuli    diagnosed at age 24  . Sessile colonic polyp    noted 09/2011 -- > rec colonoscopy in 10 years, 2023  . Surgical menopause 08/2007   On HRT. Occuring since 01/09 following TAH and R-salpingo-oophorectomy   . Tibial fracture 10/2008   Left - Sustained 2/2 fall down stairs. Patient is S/P closed internal fixation with Smith Nephew tibial nail locked proximal and distal  . Vitamin D deficiency 11/2009   Vit D level 15 in 11/2009   Past Surgical History:  Procedure Laterality Date  . ABDOMINAL HYSTERECTOMY    . CESAREAN SECTION  08/2005   Primary low transverse  . COLONOSCOPY  09/22/2011   GYJ:EHUDJSH polyp in the rectum/Internal hemorrhoids, benign path  . ESOPHAGOGASTRODUODENOSCOPY  09/22/2011   FWY:OVZC, esophageal/Sessile polyp in the body of the stomach/Hiatal hernia/ABDOMINAL PAIN LIKELY FUNCTIONAL V. POST-INFECTIOUS IBS, path: mild chronic gastritis  . FRACTURE SURGERY     Right hip  . JOINT REPLACEMENT     right hip  . LAPAROSCOPY  10/2000   Operative laparoscopy with lysis of right adnexal adhesions and uterointestinal adhesions  . left tib fib    . OTHER SURGICAL HISTORY  02/2009   Closed treatment internal fixation, left tibia with Tamala Julian and Nephew tibial nail locked proximal and distal  . TOTAL ABDOMINAL  HYSTERECTOMY W/ BILATERAL SALPINGOOPHORECTOMY  08/2007   for endometriosis. With concurrent right salpingo-oophorectomy  (2009), left oophorectomy (1995)  . TUBAL LIGATION  08/2005   Right tubal ligation   Family History  Problem Relation Age of Onset  . Other Father        deceased while in service.   . Breast cancer Sister 71       remission  . Cancer Sister        breast  . Lupus Cousin   . Colon cancer Neg Hx   . Liver disease Neg Hx    Social History   Socioeconomic History  . Marital status:  Married    Spouse name: Not on file  . Number of children: 2  . Years of education: bs degree  . Highest education level: Not on file  Occupational History  . 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 Financial resource strain: Not on file  . Food insecurity:    Worry: Not on file    Inability: Not on file  . Transportation needs:    Medical: Not on file    Non-medical: Not on file  Tobacco Use  . Smoking status: Never Smoker  . Smokeless tobacco: Never Used  Substance and Sexual Activity  . Alcohol use: No    Alcohol/week: 0.0 standard drinks  . Drug use: No  . Sexual activity: Not on file  Lifestyle  . Physical activity:    Days per week: Not on file    Minutes per session: Not on file  . Stress: Not on file  Relationships  . Social connections:    Talks on phone: Not on file    Gets together: Not on file    Attends religious service: Not on file    Active member of club or organization: Not on file    Attends meetings of clubs or organizations: Not on file    Relationship status: Not on file  . Intimate partner violence:    Fear of current or ex partner: Not on file    Emotionally abused: Not on file    Physically abused: Not on file    Forced sexual activity: Not on file  Other Topics Concern  . Not on file  Social History Narrative   Insurance: Medicare.   Patient is married with two children, [employer] and [place].    She lives in [place] but children go to school in [place]  . amLODipine (NORVASC) 10 MG tablet Take 1 tablet (10 mg total) by mouth daily.  . baclofen (LIORESAL) 10 MG tablet Take 1 tablet (10 mg total) by mouth 2 (two) times daily as needed for muscle spasms. Or 2 tablets at night  . cloNIDine (CATAPRES) 0.1 MG tablet Take 0.05 mg by mouth 2 (two) times daily.  . fluticasone (FLONASE) 50 MCG/ACT nasal spray Place 1-2 sprays into both nostrils daily. Prn  . ibuprofen  (ADVIL,MOTRIN) 600 MG tablet Take 1 tablet (600 mg total) by mouth every 6 (six) hours as needed.  . Naproxen-Esomeprazole 500-20 MG TBEC Take 1 tablet by mouth 2 (two) times daily.   Allergies  Allergen Reactions  . Pineapple Shortness Of Breath and Swelling    Has Epi pen  . Hctz [Hydrochlorothiazide]     Headaches, foggy headed    . Latex Swelling  . Lisinopril     Cough   . Morphine  And Related Itching  . Tape Swelling   Recent Results (from the past 2160 hour(s))  HM MAMMOGRAPHY     Status: None   Collection Time: 06/18/18 12:00 AM  Result Value Ref Range   HM Mammogram 0-4 Bi-Rad 0-4 Bi-Rad, Self Reported Normal    Comment: [facility] negative 06/20/18   Urinalysis, Routine w reflex microscopic     Status: None   Collection Time: 07/01/18  9:44 AM  Result Value Ref Range   Color, Urine YELLOW YELLOW   APPearance CLEAR CLEAR   Specific Gravity, Urine 1.019 1.001 - 1.03   pH 7.0 5.0 - 8.0   Glucose, UA NEGATIVE NEGATIVE   Bilirubin Urine NEGATIVE NEGATIVE   Ketones, ur NEGATIVE NEGATIVE   Hgb urine dipstick NEGATIVE NEGATIVE   Protein, ur NEGATIVE NEGATIVE   Nitrite NEGATIVE NEGATIVE   Leukocytes, UA NEGATIVE NEGATIVE   Objective  Body mass index is 26.36 kg/m. Wt Readings from Last 3 Encounters:  09/10/18 189 lb (85.7 kg)  08/18/18 192 lb 12.8 oz (87.5 kg)  08/06/18 165 lb (74.8 kg)   Temp Readings from Last 3 Encounters:  09/10/18 98.2 F (36.8 C) (Oral)  08/18/18 97.6 F (36.4 C) (Oral)  08/09/18 97.9 F (36.6 C) (Oral)   BP Readings from Last 3 Encounters:  09/10/18 130/82  08/18/18 (!) 142/82  08/09/18 (!) 150/92   Pulse Readings from Last 3 Encounters:  09/10/18 67  08/18/18 67  08/09/18 87    Physical Exam Vitals signs and nursing note reviewed.  Constitutional:      Appearance: Normal appearance. She is well-developed and well-groomed.  HENT:     Head: Normocephalic and atraumatic.     Nose: Nose normal.     Mouth/Throat:     Mouth:  Mucous membranes are moist.     Pharynx: Oropharynx is clear.  Eyes:     Conjunctiva/sclera: Conjunctivae normal.     Pupils: Pupils are equal, round, and reactive to light.  Cardiovascular:     Rate and Rhythm: Normal rate and regular rhythm.     Heart sounds: Normal heart sounds.  Pulmonary:     Effort: Pulmonary effort is normal.     Breath sounds: Normal breath sounds.  Skin:    General: Skin is warm and dry.  Neurological:     General: No focal deficit present.     Mental Status: She is alert and oriented to person, place, and time. Mental status is at baseline.     Gait: Gait normal.  Psychiatric:        Attention and Perception: Attention and perception normal.        Mood and Affect: Mood and affect normal.        Speech: Speech normal.        Behavior: Behavior normal. Behavior is cooperative.        Thought Content: Thought content normal.        Cognition and Memory: Cognition and memory normal.        Judgment: Judgment normal.     Assessment   1. Flu exposure Plan   1. Tamiflu, zpack  Supportive care    Provider: Dr. Olivia Mackie McLean-Scocuzza-Internal Medicine

## 2018-09-10 NOTE — Progress Notes (Signed)
Pre visit review using our clinic review tool, if applicable. No additional management support is needed unless otherwise documented below in the visit note. 

## 2018-09-10 NOTE — Patient Instructions (Signed)

## 2018-09-13 DIAGNOSIS — F331 Major depressive disorder, recurrent, moderate: Secondary | ICD-10-CM | POA: Diagnosis not present

## 2018-09-20 DIAGNOSIS — L811 Chloasma: Secondary | ICD-10-CM | POA: Diagnosis not present

## 2018-09-20 DIAGNOSIS — L853 Xerosis cutis: Secondary | ICD-10-CM | POA: Diagnosis not present

## 2018-09-20 DIAGNOSIS — L720 Epidermal cyst: Secondary | ICD-10-CM | POA: Diagnosis not present

## 2018-09-21 DIAGNOSIS — K581 Irritable bowel syndrome with constipation: Secondary | ICD-10-CM | POA: Diagnosis not present

## 2018-09-21 DIAGNOSIS — K760 Fatty (change of) liver, not elsewhere classified: Secondary | ICD-10-CM | POA: Diagnosis not present

## 2018-09-21 DIAGNOSIS — R1011 Right upper quadrant pain: Secondary | ICD-10-CM | POA: Diagnosis not present

## 2018-09-29 DIAGNOSIS — G43909 Migraine, unspecified, not intractable, without status migrainosus: Secondary | ICD-10-CM | POA: Diagnosis not present

## 2018-10-04 DIAGNOSIS — F331 Major depressive disorder, recurrent, moderate: Secondary | ICD-10-CM | POA: Diagnosis not present

## 2018-10-12 DIAGNOSIS — D2271 Melanocytic nevi of right lower limb, including hip: Secondary | ICD-10-CM | POA: Diagnosis not present

## 2018-10-12 DIAGNOSIS — D229 Melanocytic nevi, unspecified: Secondary | ICD-10-CM | POA: Diagnosis not present

## 2018-10-12 DIAGNOSIS — L818 Other specified disorders of pigmentation: Secondary | ICD-10-CM | POA: Diagnosis not present

## 2018-10-18 DIAGNOSIS — F331 Major depressive disorder, recurrent, moderate: Secondary | ICD-10-CM | POA: Diagnosis not present

## 2018-10-26 DIAGNOSIS — R748 Abnormal levels of other serum enzymes: Secondary | ICD-10-CM | POA: Diagnosis not present

## 2018-10-26 DIAGNOSIS — N951 Menopausal and female climacteric states: Secondary | ICD-10-CM | POA: Diagnosis not present

## 2018-10-26 DIAGNOSIS — Z90710 Acquired absence of both cervix and uterus: Secondary | ICD-10-CM | POA: Diagnosis not present

## 2018-10-29 ENCOUNTER — Encounter: Payer: Self-pay | Admitting: Internal Medicine

## 2018-10-29 ENCOUNTER — Other Ambulatory Visit: Payer: Self-pay

## 2018-10-29 ENCOUNTER — Ambulatory Visit (INDEPENDENT_AMBULATORY_CARE_PROVIDER_SITE_OTHER): Payer: BLUE CROSS/BLUE SHIELD | Admitting: Internal Medicine

## 2018-10-29 VITALS — BP 130/80 | HR 70 | Temp 99.0°F | Ht 71.5 in | Wt 197.2 lb

## 2018-10-29 DIAGNOSIS — J0100 Acute maxillary sinusitis, unspecified: Secondary | ICD-10-CM

## 2018-10-29 DIAGNOSIS — R232 Flushing: Secondary | ICD-10-CM | POA: Diagnosis not present

## 2018-10-29 DIAGNOSIS — J029 Acute pharyngitis, unspecified: Secondary | ICD-10-CM | POA: Diagnosis not present

## 2018-10-29 DIAGNOSIS — K760 Fatty (change of) liver, not elsewhere classified: Secondary | ICD-10-CM | POA: Diagnosis not present

## 2018-10-29 LAB — POCT RAPID STREP A (OFFICE): RAPID STREP A SCREEN: NEGATIVE

## 2018-10-29 MED ORDER — GABAPENTIN 300 MG PO CAPS: 300.0000 mg | ORAL_CAPSULE | Freq: Every day | ORAL | 0 refills | Status: DC

## 2018-10-29 MED ORDER — AMOXICILLIN-POT CLAVULANATE 875-125 MG PO TABS: 1.0000 | ORAL_TABLET | Freq: Two times a day (BID) | ORAL | 0 refills | Status: DC

## 2018-10-29 NOTE — Patient Instructions (Addendum)
Take Augmentin 2x per day x 1 week with food  Warm salt water gargles   Component Value Ref Range Performed At Pathologist Signature  Sodium 139 135 - 146 MMOL/L [place] [facility]   Potassium 3.6Comment: NO VISIBLE HEMOLYSIS 3.5 - 5.3 MMOL/L [place] [facility]   Chloride 102 98 - 110 MMOL/L [place] [facility]   CO2 30 23 - 30 MMOL/L [place] [facility]   BUN 9 8 - 24 MG/DL [place] [facility]   Glucose 81 70 - 99 MG/DL [place] [facility]   Creatinine 1.04 0.50 - 1.50 MG/DL [place] [facility]   Calcium 9.4 8.5 - 10.5 MG/DL [place] [facility]   Total Protein 7.3 6.0 - 8.3 G/DL [place] [facility]   Albumin  4.3 3.5 - 5.0 G/DL [place] [facility]   Total Bilirubin 0.6 0.1 - 1.2 MG/DL [place] [facility]   Alkaline Phosphatase 59 25 - 125 IU/L [place] [facility]   AST (SGOT) 25 5 - 40 IU/L [place] [facility]   ALT (SGPT) 29 5 - 50 IU/L [place] [facility]   Anion Gap 7 4 - 14 MMOL/L [place] [facility]   Est. GFR Non-African American 66 Comment:   GFR estimated by CKD-EPI equations, reportable up to 90 ML/MIN/1.73 M*2 >=60 ML/MIN/1.73 M*2   [facility]   Est. GFR African American 76 Comment:   GFR estimated by CKD-EPI equations, reportable up to 90 ML/MIN/1.73 M*2 >=60 ML/MIN/1.73 M*2   [facility]    Comprehensive Metabolic Panel (67/07/4579 1:59 PM EDT)  Specimen      Sinusitis, Adult Sinusitis is inflammation of your sinuses. Sinuses are hollow spaces in the bones around your face. Your sinuses are located:  Around your eyes.  In the middle of your forehead.  Behind your nose.  In your cheekbones. Mucus normally drains out of your sinuses. When your  nasal tissues become inflamed or swollen, mucus can become trapped or blocked. This allows bacteria, viruses, and fungi to grow, which leads to infection. Most infections of the sinuses are caused by a virus. Sinusitis can develop quickly. It can last for up to 4 weeks (acute) or for more than 12 weeks (chronic). Sinusitis often develops after a cold. What are the causes? This condition is caused by anything that creates swelling in the sinuses or stops mucus from draining. This includes:  Allergies.  Asthma.  Infection from bacteria or viruses.  Deformities or blockages in your nose or sinuses.  Abnormal growths in the nose (nasal polyps).  Pollutants, such as chemicals or irritants in the air.  Infection from fungi (rare). What increases the risk? You are more likely to develop this condition if you:  Have a weak body defense system (immune system).  Do a lot of swimming or diving.  Overuse nasal sprays.  Smoke. What are the signs or symptoms? The main symptoms of this condition are pain and a feeling of pressure around the affected sinuses. Other symptoms include:  Stuffy nose or congestion.  Thick drainage from your nose.  Swelling and warmth over the affected sinuses.  Headache.  Upper toothache.  A cough that may get worse at night.  Extra mucus that collects  in the throat or the back of the nose (postnasal drip).  Decreased sense of smell and taste.  Fatigue.  A fever.  Sore throat.  Bad breath. How is this diagnosed? This condition is diagnosed based on:  Your symptoms.  Your medical history.  A physical exam.  Tests to find out if your condition is acute or chronic. This may include: ? Checking your nose for nasal polyps. ? Viewing your sinuses using a device that has a light (endoscope). ? Testing for allergies or bacteria. ? Imaging tests, such as an MRI or CT scan. In rare cases, a bone biopsy may be done to rule out more serious types  of fungal sinus disease. How is this treated? Treatment for sinusitis depends on the cause and whether your condition is chronic or acute.  If caused by a virus, your symptoms should go away on their own within 10 days. You may be given medicines to relieve symptoms. They include: ? Medicines that shrink swollen nasal passages (topical intranasal decongestants). ? Medicines that treat allergies (antihistamines). ? A spray that eases inflammation of the nostrils (topical intranasal corticosteroids). ? Rinses that help get rid of thick mucus in your nose (nasal saline washes).  If caused by bacteria, your health care provider may recommend waiting to see if your symptoms improve. Most bacterial infections will get better without antibiotic medicine. You may be given antibiotics if you have: ? A severe infection. ? A weak immune system.  If caused by narrow nasal passages or nasal polyps, you may need to have surgery. Follow these instructions at home: Medicines  Take, use, or apply over-the-counter and prescription medicines only as told by your health care provider. These may include nasal sprays.  If you were prescribed an antibiotic medicine, take it as told by your health care provider. Do not stop taking the antibiotic even if you start to feel better. Hydrate and humidify   Drink enough fluid to keep your urine pale yellow. Staying hydrated will help to thin your mucus.  Use a cool mist humidifier to keep the humidity level in your home above 50%.  Inhale steam for 10-15 minutes, 3-4 times a day, or as told by your health care provider. You can do this in the bathroom while a hot shower is running.  Limit your exposure to cool or dry air. Rest  Rest as much as possible.  Sleep with your head raised (elevated).  Make sure you get enough sleep each night. General instructions   Apply a warm, moist washcloth to your face 3-4 times a day or as told by your health care  provider. This will help with discomfort.  Wash your hands often with soap and water to reduce your exposure to germs. If soap and water are not available, use hand sanitizer.  Do not smoke. Avoid being around people who are smoking (secondhand smoke).  Keep all follow-up visits as told by your health care provider. This is important. Contact a health care provider if:  You have a fever.  Your symptoms get worse.  Your symptoms do not improve within 10 days. Get help right away if:  You have a severe headache.  You have persistent vomiting.  You have severe pain or swelling around your face or eyes.  You have vision problems.  You develop confusion.  Your neck is stiff.  You have trouble breathing. Summary  Sinusitis is soreness and inflammation of your sinuses. Sinuses are hollow spaces in the bones  around your face.  This condition is caused by nasal tissues that become inflamed or swollen. The swelling traps or blocks the flow of mucus. This allows bacteria, viruses, and fungi to grow, which leads to infection.  If you were prescribed an antibiotic medicine, take it as told by your health care provider. Do not stop taking the antibiotic even if you start to feel better.  Keep all follow-up visits as told by your health care provider. This is important. This information is not intended to replace advice given to you by your health care provider. Make sure you discuss any questions you have with your health care provider. Document Released: 07/28/2005 Document Revised: 12/28/2017 Document Reviewed: 12/28/2017 Elsevier Interactive Patient Education  2019 [employer].

## 2018-10-29 NOTE — Progress Notes (Signed)
Chief Complaint  Patient presents with  . Acute Visit    face pain/headache/throat pain   Sick visit  1. C/o facial pain b/l cheeks h/a and sore throat and felt subjective fever her kids recently had flu then her daughter strep and she is worried she has strep throat. She reports facial pressure on her cheeks L>R tried ibuprofen and flonase, sx's x 4-5 days, She has a dry cough worse at night no wheezing and she also c/o stuffy nose   2. Hot flashes she does not wish to go back on estrogen orally and wants to disc other options disc effexor and gabapentin. She tried clonidine in the past and had side effects to this and does not want to be on antidepressant medication.  3. Fatty liver with fluctuating lfts reviewed labs    Review of Systems  Constitutional: Positive for fever. Negative for weight loss.  HENT: Positive for congestion, sinus pain and sore throat.   Eyes: Negative for blurred vision.  Respiratory: Positive for cough. Negative for sputum production and wheezing.   Cardiovascular: Negative for chest pain.  Genitourinary:       +hot flashes    Skin: Negative for rash.  Neurological: Positive for headaches.   Past Medical History:  Diagnosis Date  . Abdominal pain, epigastric 12/17/2015  . Anxiety    pt denies having dx of anxiety  . BCC (basal cell carcinoma of skin)    right foot   . Bunion, right foot   . Chronic cough   . Closed fibular fracture 10/2008   Left - Sustained 2/2 fall down stairs (same fall as tibular fracture). S/P internal fixation.  . Duodenitis    EGD (07/14/2011) showing chronic duodenitis, H.pyloir neg  . Elevated liver enzymes   . Endometriosis    S/P TAH and salpingo-oophorectomy, right  . Epigastric pain    Chronic, thought 2/2 gastritis, EGD (07/14/2011) showing chronic duodenitis, H.pyloir neg // Repeat EGD (09/2011) - esophagealring, sessible polyp in stomach body, hiatal hernia - rec ad probiotic, await bx  . Esophageal ring    s/p  dilatation during EGD (07/2011) - Dr. Oneida Alar  . Fatty liver   . Gastritis    Thought due to chronic NSAID use 2/2 pain from her congenital hip abnormality  . Headache(784.0)    Migraines  . Hidradenitis suppurativa 05/2006  . 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 With chronic OA of hip  . History of migraine headaches    Typical symptoms - bright lights, unilateral (starting behind her left ear), throbbing .  Marland Kitchen Hot flashes   . Hypertension   . Internal hemorrhoids   . Osteoarthritis of hip   . Ovarian cancer ([place]) 1995  . Protrusio acetabuli    diagnosed at age 27  . Sessile colonic polyp    noted 09/2011 -- > rec colonoscopy in 10 years, 2023  . Surgical menopause 08/2007   On HRT. Occuring since 01/09 following TAH and R-salpingo-oophorectomy   . Tibial fracture 10/2008   Left - Sustained 2/2 fall down stairs. Patient is S/P closed internal fixation with Smith Nephew tibial nail locked proximal and distal  . Vitamin D deficiency 11/2009   Vit D level 15 in 11/2009   Past Surgical History:  Procedure Laterality Date  . ABDOMINAL HYSTERECTOMY    . CESAREAN SECTION  08/2005   Primary low transverse  . COLONOSCOPY  09/22/2011   TDD:UKGURKY polyp in  the rectum/Internal hemorrhoids, benign path  . ESOPHAGOGASTRODUODENOSCOPY  09/22/2011   FOY:DXAJ, esophageal/Sessile polyp in the body of the stomach/Hiatal hernia/ABDOMINAL PAIN LIKELY FUNCTIONAL V. POST-INFECTIOUS IBS, path: mild chronic gastritis  . FRACTURE SURGERY     Right hip  . JOINT REPLACEMENT     right hip  . LAPAROSCOPY  10/2000   Operative laparoscopy with lysis of right adnexal adhesions and uterointestinal adhesions  . left tib fib    . OTHER SURGICAL HISTORY  02/2009   Closed treatment internal fixation, left tibia with Tamala Julian and Nephew tibial nail locked proximal and distal  . TOTAL ABDOMINAL HYSTERECTOMY W/ BILATERAL SALPINGOOPHORECTOMY  08/2007    for endometriosis. With concurrent right salpingo-oophorectomy  (2009), left oophorectomy (1995)  . TUBAL LIGATION  08/2005   Right tubal ligation   Family History  Problem Relation Age of Onset  . Other Father        deceased while in service.   . Breast cancer Sister 30       remission  . Cancer Sister        breast  . Lupus Cousin   . Colon cancer Neg Hx   . Liver disease Neg Hx    Social History   Socioeconomic History  . Marital status: Married    Spouse name: Not on file  . Number of children: 2  . Years of education: bs degree  . Highest education level: Not on file  Occupational History  . 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 Financial resource strain: Not on file  . Food insecurity:    Worry: Not on file    Inability: Not on file  . Transportation needs:    Medical: Not on file    Non-medical: Not on file  Tobacco Use  . Smoking status: Never Smoker  . Smokeless tobacco: Never Used  Substance and Sexual Activity  . Alcohol use: No    Alcohol/week: 0.0 standard drinks  . Drug use: No  . Sexual activity: Not on file  Lifestyle  . Physical activity:    Days per week: Not on file    Minutes per session: Not on file  . Stress: Not on file  Relationships  . Social connections:    Talks on phone: Not on file    Gets together: Not on file    Attends religious service: Not on file    Active member of club or organization: Not on file    Attends meetings of clubs or organizations: Not on file    Relationship status: Not on file  . Intimate partner violence:    Fear of current or ex partner: Not on file    Emotionally abused: Not on file    Physically abused: Not on file    Forced sexual activity: Not on file  Other Topics Concern  . Not on file  Social History Narrative   Insurance: Medicare.   Patient is married with two children, [place] and [place].    She lives in [place] but children go to school in  [place]  . amLODipine (NORVASC) 10 MG tablet Take 1 tablet (10 mg total) by mouth daily.  . baclofen (LIORESAL) 10 MG tablet Take 1 tablet (10 mg total) by mouth 2 (two) times daily as needed for muscle spasms. Or 2 tablets at night  . cloNIDine (CATAPRES) 0.1 MG tablet Take 0.05 mg by  mouth 2 (two) times daily.  . fluticasone (FLONASE) 50 MCG/ACT nasal spray Place 1-2 sprays into both nostrils daily. Prn  . ibuprofen (ADVIL,MOTRIN) 600 MG tablet Take 1 tablet (600 mg total) by mouth every 6 (six) hours as needed.  . Naproxen-Esomeprazole 500-20 MG TBEC Take 1 tablet by mouth 2 (two) times daily.  . [DISCONTINUED] azithromycin (ZITHROMAX) 250 MG tablet 2 pills day 1 and 1 pill day 2-5  . [DISCONTINUED] oseltamivir (TAMIFLU) 75 MG capsule Take 1 capsule (75 mg total) by mouth 2 (two) times daily.   Allergies  Allergen Reactions  . Pineapple Shortness Of Breath and Swelling    Has Epi pen  . Hctz [Hydrochlorothiazide]     Headaches, foggy headed    . Latex Swelling  . Lisinopril     Cough   . Morphine And Related Itching  . Tape Swelling   No results found for this or any previous visit (from the past 2160 hour(s)). Objective  Body mass index is 27.12 kg/m. Wt Readings from Last 3 Encounters:  10/29/18 197 lb 3.2 oz (89.4 kg)  09/10/18 189 lb (85.7 kg)  08/18/18 192 lb 12.8 oz (87.5 kg)   Temp Readings from Last 3 Encounters:  10/29/18 99 F (37.2 C) (Oral)  09/10/18 98.2 F (36.8 C) (Oral)  08/18/18 97.6 F (36.4 C) (Oral)   BP Readings from Last 3 Encounters:  10/29/18 130/80  09/10/18 130/82  08/18/18 (!) 142/82   Pulse Readings from Last 3 Encounters:  10/29/18 70  09/10/18 67  08/18/18 67    Physical Exam Vitals signs and nursing note reviewed.  Constitutional:      Appearance: Normal appearance. She is well-developed and well-groomed.  HENT:     Head: Normocephalic and atraumatic.     Nose:     Right Sinus:  Maxillary sinus tenderness present. No frontal sinus tenderness.     Left Sinus: Maxillary sinus tenderness present. No frontal sinus tenderness.     Comments: L max sinus ttp > right      Mouth/Throat:     Mouth: Mucous membranes are moist.     Pharynx: Oropharynx is clear. Posterior oropharyngeal erythema present. No oropharyngeal exudate.  Eyes:     Conjunctiva/sclera: Conjunctivae normal.     Pupils: Pupils are equal, round, and reactive to light.  Cardiovascular:     Rate and Rhythm: Normal rate and regular rhythm.     Heart sounds: Normal heart sounds.  Pulmonary:     Effort: Pulmonary effort is normal.     Breath sounds: Normal breath sounds.  Skin:    General: Skin is warm and dry.  Neurological:     General: No focal deficit present.     Mental Status: She is alert and oriented to person, place, and time. Mental status is at baseline.     Gait: Gait normal.  Psychiatric:        Attention and Perception: Attention and perception normal.        Mood and Affect: Mood and affect normal.        Speech: Speech normal.        Behavior: Behavior normal. Behavior is cooperative.        Thought Content: Thought content normal.        Cognition and Memory: Cognition and memory normal.        Judgment: Judgment normal.     Assessment   1. Sinusitis strep negative  2. Hot flashes  3. HM 4.  Fatty liver  Plan   1. augmentin NS, flonase  Warm salt water gargles  Supportive care  2. Gabapentin 300 mg qhs  Declines effexor does not want to try antidepressants and tried clonidine with side effects and does not want to resume oral estrogen  3.  Declines flu shot this year 2019  Hep B immune, consider hep A vaccineh/o fatty liver Tdap up to date10/26/17 pna 23 had 02/19/09 HCV neg 06/05/16  HIV neg 06/05/16  mammo 06/20/18 neg  Get copy of pap [facility] [place] 2019 s/p hysterectomyfor endometriosis in 2007 prior LSO in 1995 for ovarian issues per OB/GYN notes  05/2018 [facility] -no rec paps per OB/GYN no h/o abnormals per note 05/14/18 ob/gyn DEXA consider in future  4. F/u [facility] GI bx + 04/09/18  lfts 03/11/18 normal per lab ref range [facility] lfts slightly elevated per lab ref. Range 05/25/18  Reviewed with pt ref. Ranges vary and lfts with fatty liver may rise periodically Provider: Dr. Olivia Mackie McLean-Scocuzza-Internal Medicine

## 2018-11-01 ENCOUNTER — Encounter: Payer: Self-pay | Admitting: Internal Medicine

## 2018-11-01 DIAGNOSIS — F331 Major depressive disorder, recurrent, moderate: Secondary | ICD-10-CM | POA: Diagnosis not present

## 2018-11-04 NOTE — Telephone Encounter (Signed)
[name], referral coordinator with [facility] called and left message on [facility] voicemail box.  States that they received a referral for this pt in July 2019 and they need to know if she is still interested.  They can schedule her an appointment but it will be August 2020 or after. [name] can be reached at [phone]

## 2018-11-23 ENCOUNTER — Telehealth: Payer: Self-pay

## 2018-11-23 NOTE — Telephone Encounter (Signed)
If she wants to go this was her request   [place]

## 2018-11-23 NOTE — Telephone Encounter (Signed)
Copied from [place] [id number]. Topic: Referral - Question >> Nov 22, 2018  4:51 PM Nils Flack, Marland Kitchen wrote: Reason for CRM: [facility] called - she wants to know if the pt still needs the referral.   Gache disease.  Cb is [phone] Caryl Pina

## 2018-11-29 DIAGNOSIS — H538 Other visual disturbances: Secondary | ICD-10-CM | POA: Diagnosis not present

## 2018-11-29 DIAGNOSIS — H524 Presbyopia: Secondary | ICD-10-CM | POA: Diagnosis not present

## 2018-11-29 DIAGNOSIS — G43109 Migraine with aura, not intractable, without status migrainosus: Secondary | ICD-10-CM | POA: Diagnosis not present

## 2018-12-03 ENCOUNTER — Ambulatory Visit: Payer: BLUE CROSS/BLUE SHIELD | Admitting: Internal Medicine

## 2018-12-23 DIAGNOSIS — Z471 Aftercare following joint replacement surgery: Secondary | ICD-10-CM | POA: Diagnosis not present

## 2018-12-23 DIAGNOSIS — M79605 Pain in left leg: Secondary | ICD-10-CM | POA: Diagnosis not present

## 2018-12-23 DIAGNOSIS — Z96641 Presence of right artificial hip joint: Secondary | ICD-10-CM | POA: Diagnosis not present

## 2018-12-23 DIAGNOSIS — Z96643 Presence of artificial hip joint, bilateral: Secondary | ICD-10-CM | POA: Diagnosis not present

## 2018-12-23 DIAGNOSIS — M25572 Pain in left ankle and joints of left foot: Secondary | ICD-10-CM | POA: Diagnosis not present

## 2019-01-06 ENCOUNTER — Other Ambulatory Visit: Payer: Self-pay

## 2019-01-06 ENCOUNTER — Ambulatory Visit
Admission: EM | Admit: 2019-01-06 | Discharge: 2019-01-06 | Disposition: A | Discharge status: Home / Self Care | Payer: BLUE CROSS/BLUE SHIELD | Attending: Urgent Care | Admitting: Urgent Care

## 2019-01-06 DIAGNOSIS — K047 Periapical abscess without sinus: Secondary | ICD-10-CM

## 2019-01-06 MED ORDER — TRAMADOL HCL 50 MG PO TABS: 50.0000 mg | ORAL_TABLET | Freq: Two times a day (BID) | ORAL | 0 refills | Status: DC | PRN

## 2019-01-06 MED ORDER — IBUPROFEN 800 MG PO TABS: 800.0000 mg | ORAL_TABLET | Freq: Three times a day (TID) | ORAL | 0 refills | Status: DC

## 2019-01-06 MED ORDER — AMOXICILLIN-POT CLAVULANATE 875-125 MG PO TABS: 1.0000 | ORAL_TABLET | Freq: Two times a day (BID) | ORAL | 0 refills | Status: AC

## 2019-01-06 NOTE — Discharge Instructions (Addendum)
It was very nice meeting you today in clinic. Thank you for entrusting me with your care.   As discussed, you have a dental infection. Please utilize the medications that we discussed. Your prescriptions have been called in to your pharmacy.   Make arrangements to follow up with your your dentist to discuss ongoing management of your dental condition. If your symptoms/condition worsens, please seek follow up care either here or in the ER. Please remember, our [place] providers are "right here with you" when you need [place].   Again, it was my pleasure to take care of you today. Thank you for choosing our clinic. I hope that you start to feel better quickly.   Honor Loh, MSN, APRN, FNP-C, CEN Advanced Practice Provider [facility]

## 2019-01-06 NOTE — ED Provider Notes (Signed)
[address] [place], [place] [address] [phone]   Name: [name] DOB: 1976/07/15 MRN: [id number] CSN: [id number] PCP: McLean-Scocuzza, Nino Glow, MD  Arrival date and time:  01/06/19 1344  Chief Complaint:  Abscess  NOTE: Prior to seeing the patient today, I have reviewed the triage nursing documentation and vital signs. Clinical staff has updated patient's PMH/PSHx, current medication list, and drug allergies/intolerances to ensure comprehensive history available to assist in medical decision making.   History:   HPI: [name] is a 43 y.o. female who presents today with complaints of dental pain and swelling that started on 01/04/2019.  Patient with a broken molar to her LEFT lower jaw since March 2020.  Due to [place] pandemic, her dentist has not been seeing patients in the office. Patient sees Dr. Debbora Presto, [place] in [facility]. Patient presents today with increased pain and facial swelling.  She denies any associated fevers or chills.  Patient denies any difficulty swallowing.  Past Medical History:  Diagnosis Date  . Abdominal pain, epigastric 12/17/2015  . Anxiety    pt denies having dx of anxiety  . BCC (basal cell carcinoma of skin)    right foot   . Bunion, right foot   . Chronic cough   . Closed fibular fracture 10/2008   Left - Sustained 2/2 fall down stairs (same fall as tibular fracture). S/P internal fixation.  . Duodenitis    EGD (07/14/2011) showing chronic duodenitis, H.pyloir neg  . Elevated liver enzymes   . Endometriosis    S/P TAH and salpingo-oophorectomy, right  . Epigastric pain    Chronic, thought 2/2 gastritis, EGD (07/14/2011) showing chronic duodenitis, H.pyloir neg // Repeat EGD (09/2011) - esophagealring, sessible polyp in stomach body, hiatal hernia - rec ad probiotic, await bx  . Esophageal ring    s/p dilatation during EGD (07/2011) - Dr. Oneida Alar  . Fatty liver   . Gastritis    Thought due to chronic  NSAID use 2/2 pain from her congenital hip abnormality  . Headache(784.0)    Migraines  . Hidradenitis suppurativa 05/2006  . 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 With chronic OA of hip  . History of migraine headaches    Typical symptoms - bright lights, unilateral (starting behind her left ear), throbbing .  Marland Kitchen Hot flashes   . Hypertension   . Internal hemorrhoids   . Osteoarthritis of hip   . Ovarian cancer ([place]) 1995  . Protrusio acetabuli    diagnosed at age 65  . Sessile colonic polyp    noted 09/2011 -- > rec colonoscopy in 10 years, 2023  . Surgical menopause 08/2007   On HRT. Occuring since 01/09 following TAH and R-salpingo-oophorectomy   . Tibial fracture 10/2008   Left - Sustained 2/2 fall down stairs. Patient is S/P closed internal fixation with Smith Nephew tibial nail locked proximal and distal  . Vitamin D deficiency 11/2009   Vit D level 15 in 11/2009    Past Surgical History:  Procedure Laterality Date  . ABDOMINAL HYSTERECTOMY    . CESAREAN SECTION  08/2005   Primary low transverse  . COLONOSCOPY  09/22/2011   PJK:DTOIZTI polyp in the rectum/Internal hemorrhoids, benign path  . ESOPHAGOGASTRODUODENOSCOPY  09/22/2011   WPY:KDXI, esophageal/Sessile polyp in the body of the stomach/Hiatal hernia/ABDOMINAL PAIN LIKELY FUNCTIONAL V. POST-INFECTIOUS IBS, path: mild chronic gastritis  . FRACTURE SURGERY     Right hip  .  JOINT REPLACEMENT     right hip  . LAPAROSCOPY  10/2000   Operative laparoscopy with lysis of right adnexal adhesions and uterointestinal adhesions  . left tib fib    . OTHER SURGICAL HISTORY  02/2009   Closed treatment internal fixation, left tibia with Tamala Julian and Nephew tibial nail locked proximal and distal  . TOTAL ABDOMINAL HYSTERECTOMY W/ BILATERAL SALPINGOOPHORECTOMY  08/2007   for endometriosis. With concurrent right salpingo-oophorectomy  (2009), left oophorectomy (1995)   . TUBAL LIGATION  08/2005   Right tubal ligation    Family History  Problem Relation Age of Onset  . Other Father        deceased while in service.   . Breast cancer Sister 37       remission  . Cancer Sister        breast  . Lupus Cousin   . Colon cancer Neg Hx   . Liver disease Neg Hx     Social History   Socioeconomic History  . Marital status: Married    Spouse name: Not on file  . Number of children: 2  . Years of education: bs degree  . Highest education level: Not on file  Occupational History  . 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 Financial resource strain: Not on file  . Food insecurity:    Worry: Not on file    Inability: Not on file  . Transportation needs:    Medical: Not on file    Non-medical: Not on file  Tobacco Use  . Smoking status: Never Smoker  . Smokeless tobacco: Never Used  Substance and Sexual Activity  . Alcohol use: No    Alcohol/week: 0.0 standard drinks  . Drug use: No  . Sexual activity: Not on file  Lifestyle  . Physical activity:    Days per week: Not on file    Minutes per session: Not on file  . Stress: Not on file  Relationships  . Social connections:    Talks on phone: Not on file    Gets together: Not on file    Attends religious service: Not on file    Active member of club or organization: Not on file    Attends meetings of clubs or organizations: Not on file    Relationship status: Not on file  . Intimate partner violence:    Fear of current or ex partner: Not on file    Emotionally abused: Not on file    Physically abused: Not on file    Forced sexual activity: Not on file  Other Topics Concern  . Not on file  Social History Narrative   Insurance: Medicare.   Patient is married with two children, [place] and [place].    She lives in [place] but children go to school in [place]        Patient Active Problem List   Diagnosis Date Noted  . Concussion with no loss  of consciousness 08/12/2018  . Acute pain of left shoulder 08/12/2018  . Acute right-sided low back pain without sciatica 08/12/2018  . Allergic rhinitis 08/05/2018  . Arthritis 07/01/2018  . DDD (degenerative disc disease), lumbar 05/21/2018  . Thoracic arthritis 05/21/2018  . DDD (degenerative disc disease), cervical 01/06/2018  . Hidradenitis suppurativa 10/11/2017  . Chronic frontal sinusitis 10/11/2017  . Fatty liver 10/11/2017  . Chronic ethmoidal sinusitis 09/13/2017  . Photophobia of both eyes 09/13/2017  . Abnormal liver enzymes 09/13/2017  .  History of hysterectomy with bilateral oophorectomy 06/24/2017  . Estrogen deficiency 06/24/2017  . Overweight 11/17/2016  . Abdominal pain 11/04/2016  . Headache 12/17/2015  . Hot flashes 02/20/2012  . Hiatal hernia 02/20/2012  . Chronic hip pain 02/20/2012  . Fatigue 12/19/2011  . Acetabulum intrapelvic protrusion into pelvic region or thigh 05/15/2011  . Annual physical exam 10/03/2010  . Hypertension 03/20/2010  . Vitamin D deficiency 08/20/2009  . Migraine headache 05/13/2006    Home Medications:    No outpatient medications have been marked as taking for the 01/06/19 encounter [facility] Encounter).    Allergies:   Pineapple; Hctz [hydrochlorothiazide]; Latex; Lisinopril; Morphine and related; and Tape  Review of Systems (ROS): Review of Systems  Constitutional: Negative for chills and fever.  HENT: Positive for dental problem. Negative for drooling and trouble swallowing.   Respiratory: Negative for cough and shortness of breath.   Cardiovascular: Negative for chest pain and palpitations.  Gastrointestinal: Negative for nausea and vomiting.  Hematological: Negative for adenopathy.     Physical Exam:  Triage Vital Signs ED Triage Vitals  Enc Vitals Group     BP 01/06/19 1355 (!) 147/92     Pulse Rate 01/06/19 1355 68     Resp 01/06/19 1355 18     Temp 01/06/19 1355 98.2 F (36.8 C)     Temp Source 01/06/19  1355 Oral     SpO2 01/06/19 1355 99 %     Weight 01/06/19 1353 162 lb (73.5 kg)     Height 01/06/19 1353 6' (1.829 m)     Head Circumference --      Peak Flow --      Pain Score 01/06/19 1353 8     Pain Loc --      Pain Edu? --      Excl. in [place]? --     Physical Exam  Constitutional: She is oriented to person, place, and time and well-developed, well-nourished, and in no distress.  HENT:  Head: Normocephalic and atraumatic.  Mouth/Throat: Oropharynx is clear and moist and mucous membranes are normal. Abnormal dentition. Dental abscesses present.    (+) broken tooth. (+) gingival edema and erythema. (+) tenderness extending to post-auricular area.   Eyes: Pupils are equal, round, and reactive to light. EOM are normal.  Neck: Normal range of motion. Neck supple. No tracheal deviation present.  Cardiovascular: Normal rate, regular rhythm, normal heart sounds and intact distal pulses. Exam reveals no gallop and no friction rub.  No murmur heard. Pulmonary/Chest: Effort normal and breath sounds normal. No respiratory distress. She has no wheezes. She has no rales.  Neurological: She is alert and oriented to person, place, and time.  Skin: Skin is warm and dry. No rash noted. No erythema.  Psychiatric: Mood, affect and judgment normal.  Nursing note and vitals reviewed.    Urgent Care Treatments / Results:   LABS: PLEASE NOTE: all labs that were ordered this encounter are listed, however only abnormal results are displayed. Labs Reviewed - No data to display  EKG: -None  RADIOLOGY: No results found.  PRODEDURES: Procedures  MEDICATIONS RECEIVED THIS VISIT: Medications - No data to display  PERTINENT CLINICAL COURSE NOTES/UPDATES: No data to display   Initial Impression / Assessment and Plan / Urgent Care Course:    Mikinzie R [name] is a 43 y.o. female who presents to [facility] today with complaints of Abscess  Pertinent labs & imaging results that were  available during my care of  the patient were personally reviewed by me and considered in my medical decision making (see lab/imaging section of note for values and interpretations).  Exam reveals gingival erythema and edema to LEFT lower jaw.  Area is tender to touch with associated tenderness to her postauricular area.  Patient advising that she broke her tooth back and March 2020, however due to scheduling restrictions with her dental office, she has been unable to schedule definitive treatment. Symptoms consistent with dental abscess.  Will cover with 10-day course of Augmentin.  Patient given ibuprofen for as needed use of mild to moderate pain, in addition to a short course of tramadol for severe breakthrough pain.  Discussed follow up with dental provider ASAP.  She was encouraged to call Dr. Kalman Drape office this week to advise them of diagnosed dental abscess.. I have reviewed the follow up and strict return precautions for any new or worsening symptoms. Patient is aware of symptoms that would be deemed urgent/emergent, and would thus require further evaluation either here or in the emergency department. At the time of discharge, she verbalized understanding and consent with the discharge plan as it was reviewed with her. All questions were fielded by provider and/or clinic staff prior to patient discharge.    Final Clinical Impressions(s) / Urgent Care Diagnoses:   Final diagnoses:  Dental abscess    New Prescriptions:   Meds ordered this encounter  Medications  . ibuprofen (ADVIL) 800 MG tablet    Sig: Take 1 tablet (800 mg total) by mouth 3 (three) times daily.    Dispense:  15 tablet    Refill:  0  . amoxicillin-clavulanate (AUGMENTIN) 875-125 MG tablet    Sig: Take 1 tablet by mouth 2 (two) times daily for 10 days.    Dispense:  20 tablet    Refill:  0  . traMADol (ULTRAM) 50 MG tablet    Sig: Take 1 tablet (50 mg total) by mouth every 12 (twelve) hours as needed.    Dispense:   10 tablet    Refill:  0    Controlled Substance Prescriptions:  [place] Controlled Substance Registry consulted? Yes, I have consulted the [place] Controlled Substances Registry for this patient, and feel the risk/benefit ratio today is favorable for proceeding with this prescription for a controlled substance.  NOTE: This note was prepared using Lobbyist along with smaller Company secretary. Despite my best ability to proofread, there is the potential that transcriptional errors may still occur from this process, and are completely unintentional.     Karen Kitchens, NP 01/06/19 1442

## 2019-01-06 NOTE — ED Triage Notes (Signed)
Patient complains of dental abscess that started 2 days ago with tenderness. Patient states that area is on the bottom of her left jaw, patient states that her dentist is closed. Patient reports that her face started swelling today but reports that her tooth broke in March.

## 2019-02-08 ENCOUNTER — Ambulatory Visit: Payer: BLUE CROSS/BLUE SHIELD | Admitting: Internal Medicine

## 2019-02-22 ENCOUNTER — Telehealth: Payer: Self-pay | Admitting: Internal Medicine

## 2019-02-22 NOTE — Telephone Encounter (Signed)
Pt need refill for an antibiotic gel she was using for condition under her arms,  Sent to [facility]

## 2019-02-22 NOTE — Telephone Encounter (Signed)
Copied from [place] [id number]. Topic: Quick Communication - Rx Refill/Question >> Feb 22, 2019  3:44 PM Nils Flack, Marland Kitchen wrote: Medication: clindamycin gel   Has the patient contacted their pharmacy? Yes.   (Agent: If no, request that the patient contact the pharmacy for the refill.) (Agent: If yes, when and what did the pharmacy advise?)  Preferred Pharmacy (with phone number or street name): [facility] [address]   Agent: Please be advised that RX refills may take up to 3 business days. We ask that you follow-up with your pharmacy.

## 2019-02-23 ENCOUNTER — Other Ambulatory Visit: Payer: Self-pay | Admitting: Internal Medicine

## 2019-02-23 DIAGNOSIS — L732 Hidradenitis suppurativa: Secondary | ICD-10-CM

## 2019-02-23 MED ORDER — CLINDAMYCIN PHOSPHATE 1 % EX GEL: Freq: Two times a day (BID) | CUTANEOUS | 5 refills | Status: DC

## 2019-02-24 DIAGNOSIS — H93293 Other abnormal auditory perceptions, bilateral: Secondary | ICD-10-CM | POA: Insufficient documentation

## 2019-02-24 DIAGNOSIS — H60331 Swimmer's ear, right ear: Secondary | ICD-10-CM | POA: Diagnosis not present

## 2019-02-24 DIAGNOSIS — H60501 Unspecified acute noninfective otitis externa, right ear: Secondary | ICD-10-CM | POA: Insufficient documentation

## 2019-02-24 DIAGNOSIS — F331 Major depressive disorder, recurrent, moderate: Secondary | ICD-10-CM | POA: Diagnosis not present

## 2019-02-25 ENCOUNTER — Other Ambulatory Visit: Payer: Self-pay | Admitting: Internal Medicine

## 2019-02-25 DIAGNOSIS — L732 Hidradenitis suppurativa: Secondary | ICD-10-CM

## 2019-02-25 MED ORDER — CLINDAMYCIN PHOSPHATE 1 % EX LOTN: TOPICAL_LOTION | Freq: Two times a day (BID) | CUTANEOUS | 12 refills | Status: DC

## 2019-02-25 NOTE — Telephone Encounter (Signed)
Pt was told that she needs prior auth for clindamycin (CLINDAGEL) 1 % gel  Phone for pt is [phone]

## 2019-03-07 DIAGNOSIS — H93293 Other abnormal auditory perceptions, bilateral: Secondary | ICD-10-CM | POA: Diagnosis not present

## 2019-03-08 ENCOUNTER — Telehealth: Payer: Self-pay | Admitting: Internal Medicine

## 2019-03-08 NOTE — Telephone Encounter (Signed)
Patient stated she will comply.

## 2019-03-08 NOTE — Telephone Encounter (Signed)
Diana Drought, MD  [address]  [place], [place] [address]  [phone]  [phone] (Fax)   I rec she see her orthopedic doctor can she call them and make appt  [place]

## 2019-03-08 NOTE — Telephone Encounter (Signed)
Pt want to come into office. She is having joint pain, bottom of foot to top of leg. She says this is a condition she has. Can pt come into office.

## 2019-03-11 ENCOUNTER — Ambulatory Visit
Admission: EM | Admit: 2019-03-11 | Discharge: 2019-03-11 | Disposition: A | Discharge status: Home / Self Care | Payer: Medicare Other | Attending: Family Medicine | Admitting: Family Medicine

## 2019-03-11 ENCOUNTER — Other Ambulatory Visit: Payer: Self-pay

## 2019-03-11 DIAGNOSIS — M5442 Lumbago with sciatica, left side: Secondary | ICD-10-CM

## 2019-03-11 DIAGNOSIS — M5441 Lumbago with sciatica, right side: Secondary | ICD-10-CM

## 2019-03-11 MED ORDER — METAXALONE 800 MG PO TABS: 800.0000 mg | ORAL_TABLET | Freq: Three times a day (TID) | ORAL | 0 refills | Status: DC | PRN

## 2019-03-11 MED ORDER — PREDNISONE 10 MG (21) PO TBPK: ORAL_TABLET | ORAL | 0 refills | Status: DC

## 2019-03-11 NOTE — Discharge Instructions (Signed)
Medication as prescribed.  Follow up with Ortho.  Take care  Dr. Lacinda Axon

## 2019-03-11 NOTE — ED Provider Notes (Signed)
[facility]    CSN: [id number] Arrival date & time: 03/11/19  1406      History   Chief Complaint Chief Complaint  Patient presents with  . Joint Pain   HPI  43 year old female presents with back pain, hip pain, foot pain.  Patient reports a 2 to 3-week history of the above complaints.  Patient states that her pain is predominantly in the low back and radiates downward.  She reports numbness of the feet bilaterally.  She has taken Tylenol and ibuprofen without resolution.  Pain is currently 6/10 in severity.  She has a complicated past medical history especially from an orthopedic standpoint.  She is had bilateral hip replacements.  Pain is exacerbated by activity as well as wrist.  No other reported symptoms.  No other complaints.  PMH, Surgical Hx, Family Hx, Social History reviewed and updated as below.  Past Medical History:  Diagnosis Date  . Abdominal pain, epigastric 12/17/2015  . Anxiety    pt denies having dx of anxiety  . BCC (basal cell carcinoma of skin)    right foot   . Bunion, right foot   . Chronic cough   . Closed fibular fracture 10/2008   Left - Sustained 2/2 fall down stairs (same fall as tibular fracture). S/P internal fixation.  . Duodenitis    EGD (07/14/2011) showing chronic duodenitis, H.pyloir neg  . Elevated liver enzymes   . Endometriosis    S/P TAH and salpingo-oophorectomy, right  . Epigastric pain    Chronic, thought 2/2 gastritis, EGD (07/14/2011) showing chronic duodenitis, H.pyloir neg // Repeat EGD (09/2011) - esophagealring, sessible polyp in stomach body, hiatal hernia - rec ad probiotic, await bx  . Esophageal ring    s/p dilatation during EGD (07/2011) - Dr. Oneida Alar  . Fatty liver   . Gastritis    Thought due to chronic NSAID use 2/2 pain from her congenital hip abnormality  . Headache(784.0)    Migraines  . Hidradenitis suppurativa 05/2006  . 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 With chronic OA of hip  . History of migraine headaches    Typical symptoms - bright lights, unilateral (starting behind her left ear), throbbing .  Marland Kitchen Hot flashes   . Hypertension   . Internal hemorrhoids   . Osteoarthritis of hip   . Ovarian cancer ([place]) 1995  . Protrusio acetabuli    diagnosed at age 65  . Sessile colonic polyp    noted 09/2011 -- > rec colonoscopy in 10 years, 2023  . Surgical menopause 08/2007   On HRT. Occuring since 01/09 following TAH and R-salpingo-oophorectomy   . Tibial fracture 10/2008   Left - Sustained 2/2 fall down stairs. Patient is S/P closed internal fixation with Smith Nephew tibial nail locked proximal and distal  . Vitamin D deficiency 11/2009   Vit D level 15 in 11/2009    Patient Active Problem List   Diagnosis Date Noted  . Concussion with no loss of consciousness 08/12/2018  . Acute pain of left shoulder 08/12/2018  . Acute right-sided low back pain without sciatica 08/12/2018  . Allergic rhinitis 08/05/2018  . Arthritis 07/01/2018  . DDD (degenerative disc disease), lumbar 05/21/2018  . Thoracic arthritis 05/21/2018  . DDD (degenerative disc disease), cervical 01/06/2018  . Hidradenitis suppurativa 10/11/2017  . Chronic frontal sinusitis 10/11/2017  . Fatty liver 10/11/2017  . Chronic ethmoidal sinusitis 09/13/2017  . Photophobia of both  eyes 09/13/2017  . Abnormal liver enzymes 09/13/2017  . History of hysterectomy with bilateral oophorectomy 06/24/2017  . Estrogen deficiency 06/24/2017  . Overweight 11/17/2016  . Abdominal pain 11/04/2016  . Headache 12/17/2015  . Hot flashes 02/20/2012  . Hiatal hernia 02/20/2012  . Chronic hip pain 02/20/2012  . Fatigue 12/19/2011  . Acetabulum intrapelvic protrusion into pelvic region or thigh 05/15/2011  . Annual physical exam 10/03/2010  . Hypertension 03/20/2010  . Vitamin D deficiency 08/20/2009  . Migraine headache 05/13/2006    Past Surgical History:   Procedure Laterality Date  . ABDOMINAL HYSTERECTOMY    . CESAREAN SECTION  08/2005   Primary low transverse  . COLONOSCOPY  09/22/2011   LEX:NTZGYFV polyp in the rectum/Internal hemorrhoids, benign path  . ESOPHAGOGASTRODUODENOSCOPY  09/22/2011   CBS:WHQP, esophageal/Sessile polyp in the body of the stomach/Hiatal hernia/ABDOMINAL PAIN LIKELY FUNCTIONAL V. POST-INFECTIOUS IBS, path: mild chronic gastritis  . FRACTURE SURGERY     Right hip  . JOINT REPLACEMENT     right hip  . LAPAROSCOPY  10/2000   Operative laparoscopy with lysis of right adnexal adhesions and uterointestinal adhesions  . left tib fib    . OTHER SURGICAL HISTORY  02/2009   Closed treatment internal fixation, left tibia with Tamala Julian and Nephew tibial nail locked proximal and distal  . TOTAL ABDOMINAL HYSTERECTOMY W/ BILATERAL SALPINGOOPHORECTOMY  08/2007   for endometriosis. With concurrent right salpingo-oophorectomy  (2009), left oophorectomy (1995)  . TUBAL LIGATION  08/2005   Right tubal ligation    OB History   No obstetric history on file.      Home Medications    Prior to Admission medications   Medication Sig Start Date End Date Taking? Authorizing Provider  amLODipine (NORVASC) 10 MG tablet Take 1 tablet (10 mg total) by mouth daily. 06/15/18   McLean-Scocuzza, Nino Glow, MD  metaxalone (SKELAXIN) 800 MG tablet Take 1 tablet (800 mg total) by mouth 3 (three) times daily as needed for muscle spasms. 03/11/19   Coral Spikes, DO  predniSONE (STERAPRED UNI-PAK 21 TAB) 10 MG (21) TBPK tablet 6 tablets on day 1; decrease by 1 tablet daily until gone. 03/11/19   Coral Spikes, DO    Family History Family History  Problem Relation Age of Onset  . Other Father        deceased while in service.   . Breast cancer Sister 50       remission  . Cancer Sister        breast  . Lupus Cousin   . Colon cancer Neg Hx   . Liver disease Neg Hx     Social History Social History   Tobacco Use  . Smoking status:  Never Smoker  . Smokeless tobacco: Never Used  Substance Use Topics  . Alcohol use: No    Alcohol/week: 0.0 standard drinks  . Drug use: No     Allergies   Pineapple, Hctz [hydrochlorothiazide], Latex, Lisinopril, Morphine and related, and Tape   Review of Systems Review of Systems  Constitutional: Negative.   Musculoskeletal: Positive for back pain.  Neurological: Positive for numbness.   Physical Exam Triage Vital Signs ED Triage Vitals  Enc Vitals Group     BP 03/11/19 1426 (!) 141/96     Pulse Rate 03/11/19 1426 74     Resp 03/11/19 1426 16     Temp 03/11/19 1426 97.7 F (36.5 C)     Temp Source 03/11/19 1426 Oral  SpO2 03/11/19 1426 100 %     Weight 03/11/19 1427 165 lb (74.8 kg)     Height 03/11/19 1427 5\' 11"  (1.803 m)     Head Circumference --      Peak Flow --      Pain Score 03/11/19 1427 6     Pain Loc --      Pain Edu? --      Excl. in [place]? --    No data found.  Updated Vital Signs BP (!) 141/96 (BP Location: Left Arm)   Pulse 74   Temp 97.7 F (36.5 C) (Oral)   Resp 16   Ht 5\' 11"  (1.803 m)   Wt 74.8 kg   SpO2 100%   BMI 23.01 kg/m   Visual Acuity Right Eye Distance:   Left Eye Distance:   Bilateral Distance:    Right Eye Near:   Left Eye Near:    Bilateral Near:     Physical Exam Vitals signs and nursing note reviewed.  Constitutional:      General: She is not in acute distress.    Appearance: Normal appearance.  HENT:     Head: Normocephalic and atraumatic.  Eyes:     General:        Right eye: No discharge.        Left eye: No discharge.     Conjunctiva/sclera: Conjunctivae normal.  Cardiovascular:     Rate and Rhythm: Normal rate and regular rhythm.  Pulmonary:     Effort: Pulmonary effort is normal.     Breath sounds: Normal breath sounds. No wheezing, rhonchi or rales.  Musculoskeletal:     Comments: Lumbar spine with diffuse tenderness to palpation.  Neurological:     Mental Status: She is alert.  Psychiatric:      Comments: Flat affect.    UC Treatments / Results  Labs (all labs ordered are listed, but only abnormal results are displayed) Labs Reviewed - No data to display  EKG   Radiology No results found.  Procedures Procedures (including critical care time)  Medications Ordered in UC Medications - No data to display  Initial Impression / Assessment and Plan / UC Course  I have reviewed the triage vital signs and the nursing notes.  Pertinent labs & imaging results that were available during my care of the patient were reviewed by me and considered in my medical decision making (see chart for details).    43 year old female presents with low back pain with sciatica.  Treating with prednisone and Skelaxin.  Final Clinical Impressions(s) / UC Diagnoses   Final diagnoses:  Acute bilateral low back pain with bilateral sciatica     Discharge Instructions     Medication as prescribed.  Follow up with Ortho.  Take care  Dr. Lacinda Axon    ED Prescriptions    Medication Sig Dispense Auth. Provider   predniSONE (STERAPRED UNI-PAK 21 TAB) 10 MG (21) TBPK tablet 6 tablets on day 1; decrease by 1 tablet daily until gone. 21 tablet Shayanne Gomm G, DO   metaxalone (SKELAXIN) 800 MG tablet Take 1 tablet (800 mg total) by mouth 3 (three) times daily as needed for muscle spasms. 30 tablet Coral Spikes, DO     Controlled Substance Prescriptions [place] Controlled Substance Registry consulted? Not Applicable   Coral Spikes, DO 03/11/19 1606

## 2019-03-11 NOTE — ED Triage Notes (Signed)
Pt c/o mid to lower back pain, right hip pain and BL foot pain for the past 2-3 weeks. States she has a bone disorder and feels like this is a flare, tried to reach out to her PCP and orthopedist but they are not currently seeing anyone.

## 2019-03-17 DIAGNOSIS — M7062 Trochanteric bursitis, left hip: Secondary | ICD-10-CM | POA: Diagnosis not present

## 2019-03-17 DIAGNOSIS — M7061 Trochanteric bursitis, right hip: Secondary | ICD-10-CM | POA: Insufficient documentation

## 2019-03-17 DIAGNOSIS — Z471 Aftercare following joint replacement surgery: Secondary | ICD-10-CM | POA: Diagnosis not present

## 2019-03-17 DIAGNOSIS — Z96641 Presence of right artificial hip joint: Secondary | ICD-10-CM | POA: Diagnosis not present

## 2019-03-21 DIAGNOSIS — Z1329 Encounter for screening for other suspected endocrine disorder: Secondary | ICD-10-CM | POA: Diagnosis not present

## 2019-03-21 DIAGNOSIS — R030 Elevated blood-pressure reading, without diagnosis of hypertension: Secondary | ICD-10-CM | POA: Diagnosis not present

## 2019-03-21 DIAGNOSIS — N951 Menopausal and female climacteric states: Secondary | ICD-10-CM | POA: Diagnosis not present

## 2019-03-21 DIAGNOSIS — R748 Abnormal levels of other serum enzymes: Secondary | ICD-10-CM | POA: Diagnosis not present

## 2019-03-24 ENCOUNTER — Ambulatory Visit: Payer: Medicare Other | Admitting: Internal Medicine

## 2019-04-01 ENCOUNTER — Telehealth: Payer: Self-pay | Admitting: Internal Medicine

## 2019-04-01 NOTE — Telephone Encounter (Signed)
OB GYN was putting her on estrogen for her hot flashes. It was causing issues with her liver. A patch. And a BP medication with fluid pill. Wondering if it is a concerning factor to take them? Patient wants some advice about the medication.patient just wants advice. clindamycin didn't work but she would like to try what worked last time for her skin issues. Patient stated she was trying px in hip and it has been locking up and she goes through periods of needing help getting in and out of tub. She has to use a chair at times but is very unable to keep using the chair for help. Patient is having pain while going up step.

## 2019-04-01 NOTE — Telephone Encounter (Signed)
Please get more info.

## 2019-04-01 NOTE — Telephone Encounter (Signed)
Pt called and stated that she would like a call back from the nurse regarding medications. Pt states that he obgyn put her on some medications and wanted to make sure they are okay to take. Please advise

## 2019-04-06 DIAGNOSIS — Z0279 Encounter for issue of other medical certificate: Secondary | ICD-10-CM

## 2019-04-07 NOTE — Telephone Encounter (Signed)
LMTCB

## 2019-04-07 NOTE — Telephone Encounter (Signed)
Estrogen is not rec with elevated liver enzymes pill or patch   If her legs are swelling it could be due to norvasc 10 mg we could try to reduce the dose to 5 mg and add another non fluid pill blood pressure medication previously hctz caused h/a and side effects   What does she want to do?   If she is still having skin issues I rec. She see dermatology for this  -does she want a referral   If she is having hip pain will recommend she see orthopedics   [place]

## 2019-04-08 ENCOUNTER — Telehealth: Payer: Self-pay

## 2019-04-08 NOTE — Telephone Encounter (Signed)
Copied from [place] [id number]. Topic: General - Other >> Apr 07, 2019  4:47 PM Yvette Rack wrote: Reason for CRM: Pt returned call to the office. Pt requests call back.

## 2019-04-08 NOTE — Telephone Encounter (Signed)
Patient would like appt moved up to discuss below. She does not need referral for ortho or dermatology.

## 2019-04-08 NOTE — Telephone Encounter (Signed)
See other phone note

## 2019-04-22 DIAGNOSIS — Z1211 Encounter for screening for malignant neoplasm of colon: Secondary | ICD-10-CM | POA: Diagnosis not present

## 2019-04-22 DIAGNOSIS — Z86010 Personal history of colonic polyps: Secondary | ICD-10-CM | POA: Diagnosis not present

## 2019-04-22 DIAGNOSIS — Z8719 Personal history of other diseases of the digestive system: Secondary | ICD-10-CM | POA: Diagnosis not present

## 2019-04-28 DIAGNOSIS — Z471 Aftercare following joint replacement surgery: Secondary | ICD-10-CM | POA: Diagnosis not present

## 2019-04-28 DIAGNOSIS — M25551 Pain in right hip: Secondary | ICD-10-CM | POA: Diagnosis not present

## 2019-04-28 DIAGNOSIS — Z96641 Presence of right artificial hip joint: Secondary | ICD-10-CM | POA: Diagnosis not present

## 2019-05-02 DIAGNOSIS — F331 Major depressive disorder, recurrent, moderate: Secondary | ICD-10-CM | POA: Diagnosis not present

## 2019-05-14 DIAGNOSIS — Z96641 Presence of right artificial hip joint: Secondary | ICD-10-CM | POA: Diagnosis not present

## 2019-05-14 DIAGNOSIS — M25551 Pain in right hip: Secondary | ICD-10-CM | POA: Diagnosis not present

## 2019-05-18 ENCOUNTER — Other Ambulatory Visit: Payer: Self-pay

## 2019-05-18 ENCOUNTER — Ambulatory Visit (INDEPENDENT_AMBULATORY_CARE_PROVIDER_SITE_OTHER): Payer: BC Managed Care – PPO | Admitting: Internal Medicine

## 2019-05-18 ENCOUNTER — Encounter: Payer: Self-pay | Admitting: Internal Medicine

## 2019-05-18 VITALS — BP 142/98 | HR 67 | Temp 97.5°F | Ht 71.0 in | Wt 207.0 lb

## 2019-05-18 DIAGNOSIS — M25552 Pain in left hip: Secondary | ICD-10-CM

## 2019-05-18 DIAGNOSIS — R1084 Generalized abdominal pain: Secondary | ICD-10-CM | POA: Diagnosis not present

## 2019-05-18 DIAGNOSIS — Z13220 Encounter for screening for lipoid disorders: Secondary | ICD-10-CM

## 2019-05-18 DIAGNOSIS — M545 Low back pain, unspecified: Secondary | ICD-10-CM

## 2019-05-18 DIAGNOSIS — R1030 Lower abdominal pain, unspecified: Secondary | ICD-10-CM

## 2019-05-18 DIAGNOSIS — R634 Abnormal weight loss: Secondary | ICD-10-CM

## 2019-05-18 DIAGNOSIS — Z1231 Encounter for screening mammogram for malignant neoplasm of breast: Secondary | ICD-10-CM | POA: Diagnosis not present

## 2019-05-18 DIAGNOSIS — L732 Hidradenitis suppurativa: Secondary | ICD-10-CM

## 2019-05-18 DIAGNOSIS — G8929 Other chronic pain: Secondary | ICD-10-CM

## 2019-05-18 DIAGNOSIS — I10 Essential (primary) hypertension: Secondary | ICD-10-CM

## 2019-05-18 DIAGNOSIS — R1011 Right upper quadrant pain: Secondary | ICD-10-CM

## 2019-05-18 DIAGNOSIS — E559 Vitamin D deficiency, unspecified: Secondary | ICD-10-CM | POA: Diagnosis not present

## 2019-05-18 DIAGNOSIS — R232 Flushing: Secondary | ICD-10-CM

## 2019-05-18 DIAGNOSIS — Z1329 Encounter for screening for other suspected endocrine disorder: Secondary | ICD-10-CM | POA: Diagnosis not present

## 2019-05-18 DIAGNOSIS — R252 Cramp and spasm: Secondary | ICD-10-CM

## 2019-05-18 DIAGNOSIS — R790 Abnormal level of blood mineral: Secondary | ICD-10-CM | POA: Diagnosis not present

## 2019-05-18 DIAGNOSIS — K5909 Other constipation: Secondary | ICD-10-CM

## 2019-05-18 DIAGNOSIS — F419 Anxiety disorder, unspecified: Secondary | ICD-10-CM

## 2019-05-18 DIAGNOSIS — E2839 Other primary ovarian failure: Secondary | ICD-10-CM

## 2019-05-18 DIAGNOSIS — M25551 Pain in right hip: Secondary | ICD-10-CM

## 2019-05-18 MED ORDER — CLINDAMYCIN PHOSPHATE 1 % EX GEL: Freq: Two times a day (BID) | CUTANEOUS | 11 refills | Status: DC

## 2019-05-18 MED ORDER — LOSARTAN POTASSIUM 25 MG PO TABS: 25.0000 mg | ORAL_TABLET | Freq: Every day | ORAL | 3 refills | Status: DC

## 2019-05-18 NOTE — Patient Instructions (Addendum)
Lidocaine or Salonpas pain patch  unsweet warm prune juice   ASK GI about these medications  Trulance 3 mg for constipation (no weight gain)  Amitiza 8 mg to 24 mg 2x per day <1% weight gain  Motegrity 2 mg daily (high risk headache 19%)   MRI 05/16/2019  FINDINGS:  PELVIS Pelvic ring: Within normal limits. Viscera: Within normal limits. Soft tissues: Within normal limits. Small volume free fluid in the pelvis.  RIGHT HIP/PROXIMAL FEMUR Femur: Right total hip arthroplasty with susceptibility artifact limiting evaluation of adjacent structures. Hip joint: Small effusion Soft tissues (peri-articular): Partial insertional tear gluteus medius tendon.  LEFT HIP/PROXIMAL FEMUR Femur: Left total hip arthroplasty with susceptibility artifact limiting evaluation of adjacent structures. Hip joint: Small effusion Soft tissues (peri-articular): Mild high signal within the quadratus femoris muscle on the left without narrowing at the ischiofemoral space.  Other Result Information  Interface, Rad Results In - 05/16/2019 11:40 AM EDT MR PELVIS AND HIPS WITHOUT CONTRAST, 05/14/2019 12:44 PM   INDICATION: s/p bilateral THA  now with R hip area pain.  Evaluate for effusion of hip with metal reduction protocol \ R hip pain \ Z96.641 Status post right hip replacement \ M25.551 Right hip pain   COMPARISON: Multiple, most recent radiographs from 01/29/2019.   TECHNIQUE: Multiplanar, multi-sequence MR images of the pelvis and hips were obtained without contrast.   FINDINGS:  PELVIS Pelvic ring: Within normal limits. Viscera: Within normal limits. Soft tissues: Within normal limits. Small volume free fluid in the pelvis.  RIGHT HIP/PROXIMAL FEMUR Femur: Right total hip arthroplasty with susceptibility artifact limiting evaluation of adjacent structures. Hip joint: Small effusion Soft tissues (peri-articular): Partial insertional tear gluteus medius tendon.  LEFT HIP/PROXIMAL FEMUR Femur: Left  total hip arthroplasty with susceptibility artifact limiting evaluation of adjacent structures. Hip joint: Small effusion Soft tissues (peri-articular): Mild high signal within the quadratus femoris muscle on the left without narrowing at the ischiofemoral space.   CONCLUSION:  1.  Bilateral total hip arthroplasties with associated susceptibility artifact limiting evaluation of adjacent structures. Trace bilateral hip joint effusions. 2.  Low grade partial insertional tear gluteus medius tendon on the right.     effexor/Venlafaxine extended-release capsules What is this medicine? VENLAFAXINE(VEN la fax een) is used to treat depression, anxiety and panic disorder. This medicine may be used for other purposes; ask your health care provider or pharmacist if you have questions. COMMON BRAND NAME(S): Effexor XR What should I tell my health care provider before I take this medicine? They need to know if you have any of these conditions:  bleeding disorders  glaucoma  heart disease  high blood pressure  high cholesterol  kidney disease  liver disease  low levels of sodium in the blood  mania or bipolar disorder  seizures  suicidal thoughts, plans, or attempt; a previous suicide attempt by you or a family  take medicines that treat or prevent blood clots  thyroid disease  an unusual or allergic reaction to venlafaxine, desvenlafaxine, other medicines, foods, dyes, or preservatives  pregnant or trying to get pregnant  breast-feeding How should I use this medicine? Take this medicine by mouth with a full glass of water. Follow the directions on the prescription label. Do not cut, crush, or chew this medicine. Take it with food. If needed, the capsule may be carefully opened and the entire contents sprinkled on a spoonful of cool applesauce. Swallow the applesauce/pellet mixture right away without chewing and follow with a glass of water to  ensure complete swallowing of the  pellets. Try to take your medicine at about the same time each day. Do not take your medicine more often than directed. Do not stop taking this medicine suddenly except upon the advice of your doctor. Stopping this medicine too quickly may cause serious side effects or your condition may worsen. A special MedGuide will be given to you by the pharmacist with each prescription and refill. Be sure to read this information carefully each time. Talk to your pediatrician regarding the use of this medicine in children. Special care may be needed. Overdosage: If you think you have taken too much of this medicine contact a poison control center or emergency room at once. NOTE: This medicine is only for you. Do not share this medicine with others. What if I miss a dose? If you miss a dose, take it as soon as you can. If it is almost time for your next dose, take only that dose. Do not take double or extra doses. What may interact with this medicine? Do not take this medicine with any of the following medications:  certain medicines for fungal infections like fluconazole, itraconazole, ketoconazole, posaconazole, voriconazole  cisapride  desvenlafaxine  dronedarone  duloxetine  levomilnacipran  linezolid  MAOIs like Carbex, Eldepryl, Marplan, Nardil, and Parnate  methylene blue (injected into a vein)  milnacipran  pimozide  thioridazine This medicine may also interact with the following medications:  amphetamines  aspirin and aspirin-like medicines  certain medicines for depression, anxiety, or psychotic disturbances  certain medicines for migraine headaches like almotriptan, eletriptan, frovatriptan, naratriptan, rizatriptan, sumatriptan, zolmitriptan  certain medicines for sleep  certain medicines that treat or prevent blood clots like dalteparin, enoxaparin,  warfarin  cimetidine  clozapine  diuretics  fentanyl  furazolidone  indinavir  isoniazid  lithium  metoprolol  NSAIDS, medicines for pain and inflammation, like ibuprofen or naproxen  other medicines that prolong the QT interval (cause an abnormal heart rhythm) like dofetilide, ziprasidone  procarbazine  rasagiline  supplements like St. John's wort, kava kava, valerian  tramadol  tryptophan This list may not describe all possible interactions. Give your health care provider a list of all the medicines, herbs, non-prescription drugs, or dietary supplements you use. Also tell them if you smoke, drink alcohol, or use illegal drugs. Some items may interact with your medicine. What should I watch for while using this medicine? Tell your doctor if your symptoms do not get better or if they get worse. Visit your doctor or health care professional for regular checks on your progress. Because it may take several weeks to see the full effects of this medicine, it is important to continue your treatment as prescribed by your doctor. Patients and their families should watch out for new or worsening thoughts of suicide or depression. Also watch out for sudden changes in feelings such as feeling anxious, agitated, panicky, irritable, hostile, aggressive, impulsive, severely restless, overly excited and hyperactive, or not being able to sleep. If this happens, especially at the beginning of treatment or after a change in dose, call your health care professional. This medicine can cause an increase in blood pressure. Check with your doctor for instructions on monitoring your blood pressure while taking this medicine. You may get drowsy or dizzy. Do not drive, use machinery, or do anything that needs mental alertness until you know how this medicine affects you. Do not stand or sit up quickly, especially if you are an older patient. This reduces the risk of  dizzy or fainting spells. Alcohol may  interfere with the effect of this medicine. Avoid alcoholic drinks. Your mouth may get dry. Chewing sugarless gum, sucking hard candy and drinking plenty of water will help. Contact your doctor if the problem does not go away or is severe. What side effects may I notice from receiving this medicine? Side effects that you should report to your doctor or health care professional as soon as possible:  allergic reactions like skin rash, itching or hives, swelling of the face, lips, or tongue  anxious  breathing problems  confusion  changes in vision  chest pain  confusion  elevated mood, decreased need for sleep, racing thoughts, impulsive behavior  eye pain  fast, irregular heartbeat  feeling faint or lightheaded, falls  feeling agitated, angry, or irritable  hallucination, loss of contact with reality  high blood pressure  loss of balance or coordination  palpitations  redness, blistering, peeling or loosening of the skin, including inside the mouth  restlessness, pacing, inability to keep still  seizures  stiff muscles  suicidal thoughts or other mood changes  trouble passing urine or change in the amount of urine  trouble sleeping  unusual bleeding or bruising  unusually weak or tired  vomiting Side effects that usually do not require medical attention (report to your doctor or health care professional if they continue or are bothersome):  change in sex drive or performance  change in appetite or weight  constipation  dizziness  dry mouth  headache  increased sweating  nausea  tired This list may not describe all possible side effects. Call your doctor for medical advice about side effects. You may report side effects to FDA at 1-800-FDA-1088. Where should I keep my medicine? Keep out of the reach of children. Store at a controlled temperature between 20 and 25 degrees C (68 degrees and 77 degrees F), in a dry place. Throw away any unused  medicine after the expiration date. NOTE: This sheet is a summary. It may not cover all possible information. If you have questions about this medicine, talk to your doctor, pharmacist, or health care provider.  2020 Elsevier/Gold Standard (2018-07-20 12:06:43)   Low-Sodium Eating Plan Sodium, which is an element that makes up salt, helps you maintain a healthy balance of fluids in your body. Too much sodium can increase your blood pressure and cause fluid and waste to be held in your body. Your health care provider or dietitian may recommend following this plan if you have high blood pressure (hypertension), kidney disease, liver disease, or heart failure. Eating less sodium can help lower your blood pressure, reduce swelling, and protect your heart, liver, and kidneys. What are tips for following this plan? General guidelines  Most people on this plan should limit their sodium intake to 1,500-2,000 mg (milligrams) of sodium each day. Reading food labels   The Nutrition Facts label lists the amount of sodium in one serving of the food. If you eat more than one serving, you must multiply the listed amount of sodium by the number of servings.  Choose foods with less than 140 mg of sodium per serving.  Avoid foods with 300 mg of sodium or more per serving. Shopping  Look for lower-sodium products, often labeled as "low-sodium" or "no salt added."  Always check the sodium content even if foods are labeled as "unsalted" or "no salt added".  Buy fresh foods. ? Avoid canned foods and premade or frozen meals. ? Avoid canned, cured, or  processed meats  Buy breads that have less than 80 mg of sodium per slice. Cooking  Eat more home-cooked food and less restaurant, buffet, and fast food.  Avoid adding salt when cooking. Use salt-free seasonings or herbs instead of table salt or sea salt. Check with your health care provider or pharmacist before using salt substitutes.  Cook with  plant-based oils, such as canola, sunflower, or olive oil. Meal planning  When eating at a restaurant, ask that your food be prepared with less salt or no salt, if possible.  Avoid foods that contain MSG (monosodium glutamate). MSG is sometimes added to [place] food, bouillon, and some canned foods. What foods are recommended? The items listed may not be a complete list. Talk with your dietitian about what dietary choices are best for you. Grains Low-sodium cereals, including oats, puffed wheat and rice, and shredded wheat. Low-sodium crackers. Unsalted rice. Unsalted pasta. Low-sodium bread. Whole-grain breads and whole-grain pasta. Vegetables Fresh or frozen vegetables. "No salt added" canned vegetables. "No salt added" tomato sauce and paste. Low-sodium or reduced-sodium tomato and vegetable juice. Fruits Fresh, frozen, or canned fruit. Fruit juice. Meats and other protein foods Fresh or frozen (no salt added) meat, poultry, seafood, and fish. Low-sodium canned tuna and salmon. Unsalted nuts. Dried peas, beans, and lentils without added salt. Unsalted canned beans. Eggs. Unsalted nut butters. Dairy Milk. Soy milk. Cheese that is naturally low in sodium, such as ricotta cheese, fresh mozzarella, or Swiss cheese Low-sodium or reduced-sodium cheese. Cream cheese. Yogurt. Fats and oils Unsalted butter. Unsalted margarine with no trans fat. Vegetable oils such as canola or olive oils. Seasonings and other foods Fresh and dried herbs and spices. Salt-free seasonings. Low-sodium mustard and ketchup. Sodium-free salad dressing. Sodium-free light mayonnaise. Fresh or refrigerated horseradish. Lemon juice. Vinegar. Homemade, reduced-sodium, or low-sodium soups. Unsalted popcorn and pretzels. Low-salt or salt-free chips. What foods are not recommended? The items listed may not be a complete list. Talk with your dietitian about what dietary choices are best for you. Grains Instant hot cereals. Bread  stuffing, pancake, and biscuit mixes. Croutons. Seasoned rice or pasta mixes. Noodle soup cups. Boxed or frozen macaroni and cheese. Regular salted crackers. Self-rising flour. Vegetables Sauerkraut, pickled vegetables, and relishes. Olives. [place] fries. Onion rings. Regular canned vegetables (not low-sodium or reduced-sodium). Regular canned tomato sauce and paste (not low-sodium or reduced-sodium). Regular tomato and vegetable juice (not low-sodium or reduced-sodium). Frozen vegetables in sauces. Meats and other protein foods Meat or fish that is salted, canned, smoked, spiced, or pickled. Bacon, ham, sausage, hotdogs, corned beef, chipped beef, packaged lunch meats, salt pork, jerky, pickled herring, anchovies, regular canned tuna, sardines, salted nuts. Dairy Processed cheese and cheese spreads. Cheese curds. Blue cheese. Feta cheese. String cheese. Regular cottage cheese. Buttermilk. Canned milk. Fats and oils Salted butter. Regular margarine. Ghee. Bacon fat. Seasonings and other foods Onion salt, garlic salt, seasoned salt, table salt, and sea salt. Canned and packaged gravies. Worcestershire sauce. Tartar sauce. Barbecue sauce. Teriyaki sauce. Soy sauce, including reduced-sodium. Steak sauce. Fish sauce. Oyster sauce. Cocktail sauce. Horseradish that you find on the shelf. Regular ketchup and mustard. Meat flavorings and tenderizers. Bouillon cubes. Hot sauce and Tabasco sauce. Premade or packaged marinades. Premade or packaged taco seasonings. Relishes. Regular salad dressings. Salsa. Potato and tortilla chips. Corn chips and puffs. Salted popcorn and pretzels. Canned or dried soups. Pizza. Frozen entrees and pot pies. Summary  Eating less sodium can help lower your blood pressure, reduce swelling, and protect  your heart, liver, and kidneys.  Most people on this plan should limit their sodium intake to 1,500-2,000 mg (milligrams) of sodium each day.  Canned, boxed, and frozen foods are  high in sodium. Restaurant foods, fast foods, and pizza are also very high in sodium. You also get sodium by adding salt to food.  Try to cook at home, eat more fresh fruits and vegetables, and eat less fast food, canned, processed, or prepared foods. This information is not intended to replace advice given to you by your health care provider. Make sure you discuss any questions you have with your health care provider. Document Released: 01/17/2002 Document Revised: 07/10/2017 Document Reviewed: 07/21/2016 Elsevier Patient Education  2020 [place] DASH stands for "Dietary Approaches to Stop Hypertension." The DASH eating plan is a healthy eating plan that has been shown to reduce high blood pressure (hypertension). It may also reduce your risk for type 2 diabetes, heart disease, and stroke. The DASH eating plan may also help with weight loss. What are tips for following this plan?  General guidelines  Avoid eating more than 2,300 mg (milligrams) of salt (sodium) a day. If you have hypertension, you may need to reduce your sodium intake to 1,500 mg a day.  Limit alcohol intake to no more than 1 drink a day for nonpregnant women and 2 drinks a day for men. One drink equals 12 oz of beer, 5 oz of wine, or 1 oz of hard liquor.  Work with your health care provider to maintain a healthy body weight or to lose weight. Ask what an ideal weight is for you.  Get at least 30 minutes of exercise that causes your heart to beat faster (aerobic exercise) most days of the week. Activities may include walking, swimming, or biking.  Work with your health care provider or diet and nutrition specialist (dietitian) to adjust your eating plan to your individual calorie needs. Reading food labels   Check food labels for the amount of sodium per serving. Choose foods with less than 5 percent of the Daily Value of sodium. Generally, foods with less than 300 mg of sodium per serving fit into  this eating plan.  To find whole grains, look for the word "whole" as the first word in the ingredient list. Shopping  Buy products labeled as "low-sodium" or "no salt added."  Buy fresh foods. Avoid canned foods and premade or frozen meals. Cooking  Avoid adding salt when cooking. Use salt-free seasonings or herbs instead of table salt or sea salt. Check with your health care provider or pharmacist before using salt substitutes.  Do not fry foods. Cook foods using healthy methods such as baking, boiling, grilling, and broiling instead.  Cook with heart-healthy oils, such as olive, canola, soybean, or sunflower oil. Meal planning  Eat a balanced diet that includes: ? 5 or more servings of fruits and vegetables each day. At each meal, try to fill half of your plate with fruits and vegetables. ? Up to 6-8 servings of whole grains each day. ? Less than 6 oz of lean meat, poultry, or fish each day. A 3-oz serving of meat is about the same size as a deck of cards. One egg equals 1 oz. ? 2 servings of low-fat dairy each day. ? A serving of nuts, seeds, or beans 5 times each week. ? Heart-healthy fats. Healthy fats called Omega-3 fatty acids are found in foods such as flaxseeds and coldwater fish, like sardines,  salmon, and mackerel.  Limit how much you eat of the following: ? Canned or prepackaged foods. ? Food that is high in trans fat, such as fried foods. ? Food that is high in saturated fat, such as fatty meat. ? Sweets, desserts, sugary drinks, and other foods with added sugar. ? Full-fat dairy products.  Do not salt foods before eating.  Try to eat at least 2 vegetarian meals each week.  Eat more home-cooked food and less restaurant, buffet, and fast food.  When eating at a restaurant, ask that your food be prepared with less salt or no salt, if possible. What foods are recommended? The items listed may not be a complete list. Talk with your dietitian about what dietary  choices are best for you. Grains Whole-grain or whole-wheat bread. Whole-grain or whole-wheat pasta. Brown rice. [place]. Bulgur. Whole-grain and low-sodium cereals. Pita bread. Low-fat, low-sodium crackers. Whole-wheat flour tortillas. Vegetables Fresh or frozen vegetables (raw, steamed, roasted, or grilled). Low-sodium or reduced-sodium tomato and vegetable juice. Low-sodium or reduced-sodium tomato sauce and tomato paste. Low-sodium or reduced-sodium canned vegetables. Fruits All fresh, dried, or frozen fruit. Canned fruit in natural juice (without added sugar). Meat and other protein foods Skinless chicken or [place]. Ground chicken or [place]. Pork with fat trimmed off. Fish and seafood. Egg whites. Dried beans, peas, or lentils. Unsalted nuts, nut butters, and seeds. Unsalted canned beans. Lean cuts of beef with fat trimmed off. Low-sodium, lean deli meat. Dairy Low-fat (1%) or fat-free (skim) milk. Fat-free, low-fat, or reduced-fat cheeses. Nonfat, low-sodium ricotta or cottage cheese. Low-fat or nonfat yogurt. Low-fat, low-sodium cheese. Fats and oils Soft margarine without trans fats. Vegetable oil. Low-fat, reduced-fat, or light mayonnaise and salad dressings (reduced-sodium). Canola, safflower, olive, soybean, and sunflower oils. Avocado. Seasoning and other foods Herbs. Spices. Seasoning mixes without salt. Unsalted popcorn and pretzels. Fat-free sweets. What foods are not recommended? The items listed may not be a complete list. Talk with your dietitian about what dietary choices are best for you. Grains Baked goods made with fat, such as croissants, muffins, or some breads. Dry pasta or rice meal packs. Vegetables Creamed or fried vegetables. Vegetables in a cheese sauce. Regular canned vegetables (not low-sodium or reduced-sodium). Regular canned tomato sauce and paste (not low-sodium or reduced-sodium). Regular tomato and vegetable juice (not low-sodium or reduced-sodium).  Angie Fava. Olives. Fruits Canned fruit in a light or heavy syrup. Fried fruit. Fruit in cream or butter sauce. Meat and other protein foods Fatty cuts of meat. Ribs. Fried meat. Berniece Salines. Sausage. Bologna and other processed lunch meats. Salami. Fatback. Hotdogs. Bratwurst. Salted nuts and seeds. Canned beans with added salt. Canned or smoked fish. Whole eggs or egg yolks. Chicken or [place] with skin. Dairy Whole or 2% milk, cream, and half-and-half. Whole or full-fat cream cheese. Whole-fat or sweetened yogurt. Full-fat cheese. Nondairy creamers. Whipped toppings. Processed cheese and cheese spreads. Fats and oils Butter. Stick margarine. Lard. Shortening. Ghee. Bacon fat. Tropical oils, such as coconut, palm kernel, or palm oil. Seasoning and other foods Salted popcorn and pretzels. Onion salt, garlic salt, seasoned salt, table salt, and sea salt. Worcestershire sauce. Tartar sauce. Barbecue sauce. Teriyaki sauce. Soy sauce, including reduced-sodium. Steak sauce. Canned and packaged gravies. Fish sauce. Oyster sauce. Cocktail sauce. Horseradish that you find on the shelf. Ketchup. Mustard. Meat flavorings and tenderizers. Bouillon cubes. Hot sauce and Tabasco sauce. Premade or packaged marinades. Premade or packaged taco seasonings. Relishes. Regular salad dressings. Where to find more information:  [employer]  Heart, Lung, and Blood Institute: https://wilson-eaton.com/  American Heart Association: www.heart.org Summary  The DASH eating plan is a healthy eating plan that has been shown to reduce high blood pressure (hypertension). It may also reduce your risk for type 2 diabetes, heart disease, and stroke.  With the DASH eating plan, you should limit salt (sodium) intake to 2,300 mg a day. If you have hypertension, you may need to reduce your sodium intake to 1,500 mg a day.  When on the DASH eating plan, aim to eat more fresh fruits and vegetables, whole grains, lean proteins, low-fat dairy, and  heart-healthy fats.  Work with your health care provider or diet and nutrition specialist (dietitian) to adjust your eating plan to your individual calorie needs. This information is not intended to replace advice given to you by your health care provider. Make sure you discuss any questions you have with your health care provider. Document Released: 07/17/2011 Document Revised: 07/10/2017 Document Reviewed: 07/21/2016 Elsevier Patient Education  2020 [employer].

## 2019-05-18 NOTE — Progress Notes (Signed)
Chief Complaint  Patient presents with  . Follow-up   F/u 05/18/2019  1. HTN uncontrolled on norvasc 10 mg qd and she did take today BP was 142/98 and she has been having h/as. No side effects of leg swelling with this medication   2. C/o abdominal pain RUQ and female pelvic pain and right hip pain 8/10 had MRI hips, pain 8/10 nothing tried    3. Hot flashes tried gabapentin wife side effects and clonidine with side effects and worried about side effects of estrogen causing elevated lfts   4. C/o cramps in hands and feet as well   5. Hip pain she f/u with ortho in [place] (of note ANA negative 03/11/18) pain is 8/10 R> L hip  MRI b/l hips 05/16/2019  1. Bilateral total hip arthroplasties with associated susceptibility artifact limiting evaluation of adjacent structures. Trace bilateral hip joint effusions. 2. Low grade partial insertional tear gluteus medius tendon on the right.  6. Chronic constipation dulcolax helps, linzess she was worried about weight gain was on 145 dose w/o help and 290 dose was too strong per pt f/u [facility] she ask about Trulance colonoscopy sch 05/30/19. Metamucil did not help nor Miralax.  She is c/w wt gain with medications for constipation   7. Hidradenitis worse L> R axilla she reports clindagel worked better and lotion insurance would not cover gel but will try to get approved again    Review of Systems  Constitutional: Positive for weight loss.  HENT: Negative for hearing loss.   Eyes: Negative for blurred vision.  Respiratory: Negative for shortness of breath.   Cardiovascular: Negative for chest pain.  Gastrointestinal: Positive for abdominal pain and constipation. Negative for nausea and vomiting.  Musculoskeletal: Positive for back pain, joint pain and neck pain.  Skin: Negative for rash.  Neurological:       +sensitivity to light chronically    Psychiatric/Behavioral: The patient is nervous/anxious.        +anxiety about current events and  son is senior in [employer]     Past Medical History:  Diagnosis Date  . Abdominal pain, epigastric 12/17/2015  . Anxiety    pt denies having dx of anxiety  . BCC (basal cell carcinoma of skin)    right foot   . Bunion, right foot   . Chronic cough   . Closed fibular fracture 10/2008   Left - Sustained 2/2 fall down stairs (same fall as tibular fracture). S/P internal fixation.  . Duodenitis    EGD (07/14/2011) showing chronic duodenitis, H.pyloir neg  . Elevated liver enzymes   . Endometriosis    S/P TAH and salpingo-oophorectomy, right  . Epigastric pain    Chronic, thought 2/2 gastritis, EGD (07/14/2011) showing chronic duodenitis, H.pyloir neg // Repeat EGD (09/2011) - esophagealring, sessible polyp in stomach body, hiatal hernia - rec ad probiotic, await bx  . Esophageal ring    s/p dilatation during EGD (07/2011) - Dr. Oneida Alar  . Fatty liver   . Gastritis    Thought due to chronic NSAID use 2/2 pain from her congenital hip abnormality  . Headache(784.0)    Migraines  . Hidradenitis suppurativa 05/2006  . 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 With chronic OA of hip  . History of migraine headaches    Typical symptoms - bright lights, unilateral (starting behind her left ear), throbbing .  Marland Kitchen Hot flashes   . Hypertension   .  Internal hemorrhoids   . Osteoarthritis of hip   . Ovarian cancer ([place]) 1995  . Protrusio acetabuli    diagnosed at age 55  . Sessile colonic polyp    noted 09/2011 -- > rec colonoscopy in 10 years, 2023  . Surgical menopause 08/2007   On HRT. Occuring since 01/09 following TAH and R-salpingo-oophorectomy   . Tibial fracture 10/2008   Left - Sustained 2/2 fall down stairs. Patient is S/P closed internal fixation with Smith Nephew tibial nail locked proximal and distal  . Vitamin D deficiency 11/2009   Vit D level 15 in 11/2009   Past Surgical History:  Procedure Laterality Date  .  ABDOMINAL HYSTERECTOMY    . CESAREAN SECTION  08/2005   Primary low transverse  . COLONOSCOPY  09/22/2011   [id number] polyp in the rectum/Internal hemorrhoids, benign path  . ESOPHAGOGASTRODUODENOSCOPY  09/22/2011   [id number], esophageal/Sessile polyp in the body of the stomach/Hiatal hernia/ABDOMINAL PAIN LIKELY FUNCTIONAL V. POST-INFECTIOUS IBS, path: mild chronic gastritis  . FRACTURE SURGERY     Right hip  . JOINT REPLACEMENT     right hip  . LAPAROSCOPY  10/2000   Operative laparoscopy with lysis of right adnexal adhesions and uterointestinal adhesions  . left tib fib    . OTHER SURGICAL HISTORY  02/2009   Closed treatment internal fixation, left tibia with Tamala Julian and Nephew tibial nail locked proximal and distal  . TOTAL ABDOMINAL HYSTERECTOMY W/ BILATERAL SALPINGOOPHORECTOMY  08/2007   for endometriosis. With concurrent right salpingo-oophorectomy  (2009), left oophorectomy (1995)  . TUBAL LIGATION  08/2005   Right tubal ligation   Family History  Problem Relation Age of Onset  . Other Father        deceased while in service.   . Breast cancer Sister 69       remission  . Cancer Sister        breast  . Lupus Cousin   . Colon cancer Neg Hx   . Liver disease Neg Hx    Social History   Socioeconomic History  . Marital status: Married    Spouse name: Not on file  . Number of children: 2  . Years of education: bs degree  . Highest education level: Not on file  Occupational History  . 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 Financial resource strain: Not on file  . Food insecurity    Worry: Not on file    Inability: Not on file  . Transportation needs    Medical: Not on file    Non-medical: Not on file  Tobacco Use  . Smoking status: Never Smoker  . Smokeless tobacco: Never Used  Substance and Sexual Activity  . Alcohol use: No    Alcohol/week: 0.0 standard drinks  . Drug use: No  . Sexual activity: Not on file  Lifestyle  .  Physical activity    Days per week: Not on file    Minutes per session: Not on file  . Stress: Not on file  Relationships  . Social Herbalist on phone: Not on file    Gets together: Not on file    Attends religious service: Not on file    Active member of club or organization: Not on file    Attends meetings of clubs or organizations: Not on file    Relationship status: Not on file  . Intimate partner violence    Fear of current or  ex partner: Not on file    Emotionally abused: Not on file    Physically abused: Not on file    Forced sexual activity: Not on file  Other Topics Concern  . Not on file  Social History Narrative   Insurance: Medicare.   Patient is married with two children, [place] and [place].    She lives in [place] but children go to school in [place]  . amLODipine (NORVASC) 10 MG tablet Take 1 tablet (10 mg total) by mouth daily.  . [DISCONTINUED] metaxalone (SKELAXIN) 800 MG tablet Take 1 tablet (800 mg total) by mouth 3 (three) times daily as needed for muscle spasms.   Allergies  Allergen Reactions  . Pineapple Shortness Of Breath and Swelling    Has Epi pen  . Hctz [Hydrochlorothiazide]     Headaches, foggy headed    . Latex Swelling  . Lisinopril     Cough   . Morphine And Related Itching  . Tape Swelling   Recent Results (from the past 2160 hour(s))  Comprehensive metabolic panel     Status: None   Collection Time: 05/20/19  8:11 AM  Result Value Ref Range   Glucose 86 65 - 99 mg/dL   BUN 8 6 - 24 mg/dL   Creatinine, Ser 0.92 0.57 - 1.00 mg/dL   GFR calc non Af Amer 75 >59 mL/min/1.73   GFR calc Af Amer 87 >59 mL/min/1.73   BUN/Creatinine Ratio 9 9 - 23   Sodium 140 134 - 144 mmol/L   Potassium 4.0 3.5 - 5.2 mmol/L   Chloride 103 96 - 106 mmol/L   CO2 24 20 - 29 mmol/L   Calcium 9.6 8.7 - 10.2 mg/dL   Total Protein 7.0 6.0 - 8.5 g/dL   Albumin 4.1 3.8 - 4.8 g/dL   Globulin, Total  2.9 1.5 - 4.5 g/dL   Albumin/Globulin Ratio 1.4 1.2 - 2.2   Bilirubin Total 0.8 0.0 - 1.2 mg/dL   Alkaline Phosphatase 85 39 - 117 IU/L   AST 19 0 - 40 IU/L   ALT 25 0 - 32 IU/L  CBC with Differential/Platelet     Status: None   Collection Time: 05/20/19  8:11 AM  Result Value Ref Range   WBC 9.9 3.4 - 10.8 x10E3/uL   RBC 4.42 3.77 - 5.28 x10E6/uL   Hemoglobin 12.3 11.1 - 15.9 g/dL   Hematocrit 36.9 34.0 - 46.6 %   MCV 84 79 - 97 fL   MCH 27.8 26.6 - 33.0 pg   MCHC 33.3 31.5 - 35.7 g/dL   RDW 14.3 11.7 - 15.4 %   Platelets 348 150 - 450 x10E3/uL   Neutrophils 69 Not Estab. %   Lymphs 22 Not Estab. %   Monocytes 8 Not Estab. %   Eos 1 Not Estab. %   Basos 0 Not Estab. %   Neutrophils Absolute 6.8 1.4 - 7.0 x10E3/uL   Lymphocytes Absolute 2.2 0.7 - 3.1 x10E3/uL   Monocytes Absolute 0.8 0.1 - 0.9 x10E3/uL   EOS (ABSOLUTE) 0.1 0.0 - 0.4 x10E3/uL   Basophils Absolute 0.0 0.0 - 0.2 x10E3/uL   Immature Granulocytes 0 Not Estab. %   Immature Grans (Abs) 0.0 0.0 - 0.1 x10E3/uL  Lipid panel     Status: Abnormal   Collection Time: 05/20/19  8:11 AM  Result Value Ref Range   Cholesterol, Total 182 100 - 199 mg/dL  Triglycerides 68 0 - 149 mg/dL   HDL 64 >39 mg/dL   VLDL Cholesterol Cal 13 5 - 40 mg/dL   LDL Chol Calc (NIH) 105 (H) 0 - 99 mg/dL   Chol/HDL Ratio 2.8 0.0 - 4.4 ratio    Comment:                                   T. Chol/HDL Ratio                                             Men  Women                               1/2 Avg.Risk  3.4    3.3                                   Avg.Risk  5.0    4.4                                2X Avg.Risk  9.6    7.1                                3X Avg.Risk 23.4   11.0   TSH     Status: None   Collection Time: 05/20/19  8:11 AM  Result Value Ref Range   TSH 2.090 0.450 - 4.500 uIU/mL  Urinalysis, Routine w reflex microscopic     Status: Abnormal   Collection Time: 05/20/19  8:11 AM  Result Value Ref Range   Specific Gravity, UA  1.019 1.005 - 1.030   pH, UA 7.0 5.0 - 7.5   Color, UA Yellow Yellow   Appearance Ur Clear Clear   Leukocytes,UA 1+ (A) Negative   Protein,UA Negative Negative/Trace   Glucose, UA Negative Negative   Ketones, UA Negative Negative   RBC, UA Negative Negative   Bilirubin, UA Negative Negative   Urobilinogen, Ur 0.2 0.2 - 1.0 mg/dL   Nitrite, UA Negative Negative   Microscopic Examination See below:     Comment: Microscopic was indicated and was performed.  Magnesium     Status: None   Collection Time: 05/20/19  8:11 AM  Result Value Ref Range   Magnesium 2.1 1.6 - 2.3 mg/dL  Vitamin D (25 hydroxy)     Status: None   Collection Time: 05/20/19  8:11 AM  Result Value Ref Range   Vit D, 25-Hydroxy 31.1 30.0 - 100.0 ng/mL    Comment: Vitamin D deficiency has been defined by the [place] practice guideline as a level of serum 25-OH vitamin D less than 20 ng/mL (1,2). The Endocrine Society went on to further define vitamin D insufficiency as a level between 21 and 29 ng/mL (2). 1. IOM (Institute of Medicine). 2010. Dietary reference    intakes for calcium and D. [place]: The    [employer]. 2. Holick MF, Binkley Neabsco, Bischoff-Ferrari HA, et al.    Evaluation, treatment, and prevention of vitamin D  deficiency: an Endocrine Society clinical practice    guideline. JCEM. 2011 Jul; 96(7):1911-30.   Microscopic Examination     Status: Abnormal   Collection Time: 05/20/19  8:11 AM  Result Value Ref Range   WBC, UA 0-5 0 - 5 /hpf   RBC 0-2 0 - 2 /hpf   Epithelial Cells (non renal) 0-10 0 - 10 /hpf   Casts Present (A) None seen /lpf   Cast Type Hyaline casts N/A   Mucus, UA Present Not Estab.   Bacteria, UA None seen None seen/Few  Urinalysis, Complete w Microscopic     Status: Abnormal   Collection Time: 05/20/19  4:13 PM  Result Value Ref Range   Color, Urine STRAW (A) YELLOW   APPearance CLEAR CLEAR   Specific Gravity,  Urine 1.010 1.005 - 1.030   pH 6.5 5.0 - 8.0   Glucose, UA NEGATIVE NEGATIVE mg/dL   Hgb urine dipstick TRACE (A) NEGATIVE   Bilirubin Urine NEGATIVE NEGATIVE   Ketones, ur NEGATIVE NEGATIVE mg/dL   Protein, ur NEGATIVE NEGATIVE mg/dL   Nitrite NEGATIVE NEGATIVE   Leukocytes,Ua TRACE (A) NEGATIVE   Squamous Epithelial / LPF 0-5 0 - 5   WBC, UA 6-10 0 - 5 WBC/hpf   RBC / HPF 0-5 0 - 5 RBC/hpf   Bacteria, UA NONE SEEN NONE SEEN    Comment: Performed at [facility], [address] [place], [place] [address]  Urine culture     Status: Abnormal   Collection Time: 05/20/19  4:13 PM   Specimen: Urine, Clean Catch  Result Value Ref Range   Specimen Description      URINE, CLEAN CATCH Performed at [facility], [address], [place], [place] [address]    Special Requests      NONE Performed at [facility], [address], [place], [place] [address]    Culture MULTIPLE SPECIES PRESENT, SUGGEST RECOLLECTION (A)    Report Status 05/22/2019 FINAL    Objective  Body mass index is 28.87 kg/m. Wt Readings from Last 3 Encounters:  05/20/19 165 lb (74.8 kg)  05/18/19 207 lb (93.9 kg)  03/11/19 165 lb (74.8 kg)   Temp Readings from Last 3 Encounters:  05/20/19 98 F (36.7 C) (Oral)  05/18/19 (!) 97.5 F (36.4 C) (Oral)  03/11/19 97.7 F (36.5 C) (Oral)   BP Readings from Last 3 Encounters:  05/20/19 (!) 150/100  05/18/19 (!) 142/98  03/11/19 (!) 141/96   Pulse Readings from Last 3 Encounters:  05/20/19 67  05/18/19 67  03/11/19 74    Physical Exam Vitals signs and nursing note reviewed.  Constitutional:      Appearance: Normal appearance. She is well-developed, well-groomed and overweight.     Comments: +mask on    HENT:     Head: Normocephalic and atraumatic.  Eyes:     Conjunctiva/sclera: Conjunctivae normal.     Pupils: Pupils are equal, round, and reactive to light.     Comments: +photophobia   Cardiovascular:     Rate  and Rhythm: Normal rate and regular rhythm.     Heart sounds: Normal heart sounds. No murmur.  Pulmonary:     Effort: Pulmonary effort is normal.     Breath sounds: Normal breath sounds.  Abdominal:     General: Bowel sounds are normal. There is no distension.     Tenderness: There is abdominal tenderness in the right upper quadrant, right lower quadrant, suprapubic area and  left lower quadrant. There is no guarding.  Skin:    General: Skin is warm and dry.  Neurological:     General: No focal deficit present.     Mental Status: She is alert and oriented to person, place, and time. Mental status is at baseline.     Gait: Gait abnormal.     Comments: BL walks with cane    Psychiatric:        Attention and Perception: Attention and perception normal.        Mood and Affect: Mood and affect normal.        Speech: Speech normal.        Behavior: Behavior normal. Behavior is cooperative.        Thought Content: Thought content normal.        Cognition and Memory: Cognition and memory normal.        Judgment: Judgment normal.     Assessment  Plan  Essential hypertension - cont norvasc 10 mg qd  Plan: add losartan (COZAAR) 25 MG tablet, Comprehensive metabolic panel, CBC with Differential/Platelet, Lipid panel  Generalized abdominal pain? Etiology r/o GI vs GU I.e kidney stones etiology - Plan: Comprehensive metabolic panel, CBC with Differential/Platelet, Urinalysis, Routine w reflex microscopic, CT Abdomen Pelvis W Contrast Colonoscopy sch 05/30/19   Muscle Cramps r/o K, mag def Hydration with water   Vitamin D deficiency - Plan: Vitamin D (25 hydroxy) 31.1  rec D3 4000-max 5000 IU daily otc   Hidradenitis suppurativa - Plan: clindamycin (CLINDAGEL) 1 % gel lotion did not work per pt   Low back pain,chronic.  09/14/12 Xray lumbar No acute fracture. There are 2 mm retrolisthesis of L5 on S1. Partially imaged postsurgical changes of bilateral total hip arthroplasties. Mild  degenerative changes of the lumbar spine, most pronounced at L4-L5 and L5-S1 as evidenced by facet arthropathy. T11-L3 anterior spurring with relative preservation of the disc spaces  Pt called back few days after visit with c/o low back pain- Plan: DG Lumbar Spine Complete to further w/u  Xray L spine in 2014 with mild deg changes   weight loss with RUQ and RLQ, LLQ ab pain - Plan: CT Abdomen Pelvis W Contrast, colonoscopy with [facility] sch 05/30/19    Hot flashes  Had side effects from gabapentin, clonidine and declines to try estrogen due to h/o elevated lfts  Disc consider effexor as she also has anxiety given info about this    Chronic constipation  linzess 145 did not work and c/w wt gain and 290 too strong  miralax does not help nor metamucil  Dulcolax helps but c/w taking laxative daily  Colonoscopy sch 05/30/19 [facility]  Disc warm prune juice  Disc trulance 3 mg no side effect of wt gain also could do amitiza 8-24 mg 2x per day <1% chance of weight gain and Motegrity 2 mg qd would avoid due to high risk of headache   Chronic b/l hip pain with MRI 05/16/2019 with trace effusions in hips  -f/u ortho [facility] -disc lidocaine or salonpas patch   HM Declines flu shot this year 2020 Hep B immune, consider hep A vaccineh/o fatty liver Tdap up to date10/26/17 pna 23 had 02/19/09 HCV neg 06/05/16  HIV neg 06/05/16  mammo 06/20/18 neg referred again [facility] with dexa  Get copy of pap [facility] [place] saw 2019 s/p hysterectomyfor endometriosis in 2007 prior LSO in 1995 for ovarian issues per OB/GYN notes 05/2018 [facility] -no rec paps per OB/GYN  no h/o abnormals per note 05/14/18 ob/gyn Colonoscopy having done 05/30/19 [facility]  Provider: Dr. Olivia Mackie McLean-Scocuzza-Internal Medicine

## 2019-05-20 ENCOUNTER — Ambulatory Visit: Payer: Self-pay | Admitting: *Deleted

## 2019-05-20 ENCOUNTER — Ambulatory Visit
Admission: EM | Admit: 2019-05-20 | Discharge: 2019-05-20 | Disposition: A | Discharge status: Home / Self Care | Payer: Medicare Other | Attending: Urgent Care | Admitting: Urgent Care

## 2019-05-20 ENCOUNTER — Encounter: Payer: Self-pay | Admitting: Emergency Medicine

## 2019-05-20 ENCOUNTER — Other Ambulatory Visit: Payer: Self-pay

## 2019-05-20 DIAGNOSIS — R1084 Generalized abdominal pain: Secondary | ICD-10-CM | POA: Diagnosis not present

## 2019-05-20 DIAGNOSIS — N390 Urinary tract infection, site not specified: Secondary | ICD-10-CM | POA: Diagnosis not present

## 2019-05-20 DIAGNOSIS — R790 Abnormal level of blood mineral: Secondary | ICD-10-CM | POA: Diagnosis not present

## 2019-05-20 DIAGNOSIS — Z1329 Encounter for screening for other suspected endocrine disorder: Secondary | ICD-10-CM | POA: Diagnosis not present

## 2019-05-20 DIAGNOSIS — J019 Acute sinusitis, unspecified: Secondary | ICD-10-CM

## 2019-05-20 DIAGNOSIS — Z13220 Encounter for screening for lipoid disorders: Secondary | ICD-10-CM | POA: Diagnosis not present

## 2019-05-20 DIAGNOSIS — I10 Essential (primary) hypertension: Secondary | ICD-10-CM | POA: Diagnosis not present

## 2019-05-20 DIAGNOSIS — E559 Vitamin D deficiency, unspecified: Secondary | ICD-10-CM | POA: Diagnosis not present

## 2019-05-20 LAB — URINALYSIS, COMPLETE (UACMP) WITH MICROSCOPIC
Bacteria, UA: NONE SEEN
Bilirubin Urine: NEGATIVE
Glucose, UA: NEGATIVE mg/dL
Ketones, ur: NEGATIVE mg/dL
Nitrite: NEGATIVE
Protein, ur: NEGATIVE mg/dL
Specific Gravity, Urine: 1.01 (ref 1.005–1.030)
pH: 6.5 (ref 5.0–8.0)

## 2019-05-20 MED ORDER — AMOXICILLIN-POT CLAVULANATE 875-125 MG PO TABS: 1.0000 | ORAL_TABLET | Freq: Two times a day (BID) | ORAL | 0 refills | Status: AC

## 2019-05-20 NOTE — Telephone Encounter (Signed)
See other nurse triage note from today for details.

## 2019-05-20 NOTE — Telephone Encounter (Signed)
Message from Oneta Rack sent at 05/20/2019 1:46 PM EDT  Summary: clinical advice   Left side pain near kidney since AM, no other symptoms. Patient seeking clinical advice [phone] (patient PCP has no availability)           Reason for Disposition . [1] SEVERE back pain (e.g., excruciating, unable to do any normal activities) AND [2] not improved 2 hours after pain medicine  Answer Assessment - Initial Assessment Questions 1. ONSET: "When did the pain begin?"      Middle of the night last night  2. LOCATION: "Where does it hurt?" (upper, mid or lower back)     Left mid back pain 3. SEVERITY: "How bad is the pain?"  (e.g., Scale 1-10; mild, moderate, or severe)   - MILD (1-3): doesn't interfere with normal activities    - MODERATE (4-7): interferes with normal activities or awakens from sleep    - SEVERE (8-10): excruciating pain, unable to do any normal activities     9- severe  4. PATTERN: "Is the pain constant?" (e.g., yes, no; constant, intermittent)      Intermittent  5. RADIATION: "Does the pain shoot into your legs or elsewhere?"     Left flank   6. CAUSE:  "What do you think is causing the back pain?"      Unsure  7. BACK OVERUSE:  "Any recent lifting of heavy objects, strenuous work or exercise?"     Denies  8. MEDICATIONS: "What have you taken so far for the pain?" (e.g., nothing, acetaminophen, NSAIDS)     Ibuprofen - not helpful 9. NEUROLOGIC SYMPTOMS: "Do you have any weakness, numbness, or problems with bowel/bladder control?"     No weakness of numbness, has full control over bowels and bladder  10. OTHER SYMPTOMS: "Do you have any other symptoms?" (e.g., fever, abdominal pain, burning with urination, blood in urine)       Urine darker than normal  11. PREGNANCY: "Is there any chance you are pregnant?" (e.g., yes, no; LMP)  Protocols used: BACK PAIN-A-AH   Patient states she began having sharp mid back pain that radiates to her left flank last night.  She  has taken ibuprofen 3 times with no relief.  She states the pain is sharp, and rates it as 9/10 on pain scale.  She denies fever, chest pain, shortness of breath, or any urinary symptoms.  Advised patient that she should go to [facility] within the next 4 hours for evaluation of severe back and flank pain, as her PCP office has no availability this afternoon.  Patient expressed understanding and is agreeable.

## 2019-05-20 NOTE — ED Triage Notes (Signed)
Patient c/o left sided flank pain that started a week ago.  Patient also reports some burning when urinating prior to having flank pain.

## 2019-05-20 NOTE — ED Provider Notes (Signed)
[place], [place]   Name: RIHONNA RAJ DOB: 12-10-1975 MRN: [id number] CSN: [id number] PCP: McLean-Scocuzza, Nino Glow, MD  Arrival date and time:  05/20/19 1602  Chief Complaint:  Flank pain and sinus issues  NOTE: Prior to seeing the patient today, I have reviewed the triage nursing documentation and vital signs. Clinical staff has updated patient's PMH/PSHx, current medication list, and drug allergies/intolerances to ensure comprehensive history available to assist in medical decision making.   History:   HPI: Ryenne R Allen-Bruce is a 43 y.o. female who presents today with multiple complaints.  Patient reports a history of paranasal sinus pain and pressure x2 weeks. She is having frequent headaches and pain behind her eyes. Patient has been using fluticasone and Sudafed without relief.  Patient was seen by her yesterday, however she did not mention this as she was concerned that she would not be allowed to present to the office if she had respiratory symptoms. Patient has experienced any fever, chills, nausea, vomiting, or diarrhea. She is eating and drinking well. Patient denies any perceived alterations to her sense of taste of smell. Patient denies being in close contact with anyone known to be ill. She has never been tested for SARS-CoV-2 (novel coronavirus) per her report. Patient has also been using Sudafed to help with her symptoms.  She presents today with an elevated blood pressure of 150/100.  She denies any associated chest pain, shortness of breath, or palpitations.  Additionally, patient with urinary symptoms that began with acute onset approximately 1 week ago. She complains of dysuria, frequency, and urgency. She has not appreciated any gross hematuria, nor has she noticed her urine being malodorous. She is concerned about increasing pain "around her LEFT kidney" and in her lower abdomen.Patient advises that she has a past medical history that is significant for recurrent urinary  tract infections. She denies any vaginal pain, bleeding, or discharge.  There are no concerns for potential pregnancy as patient is status post hysterectomy.  Past Medical History:  Diagnosis Date  . Abdominal pain, epigastric 12/17/2015  . Anxiety    pt denies having dx of anxiety  . BCC (basal cell carcinoma of skin)    right foot   . Bunion, right foot   . Chronic cough   . Closed fibular fracture 10/2008   Left - Sustained 2/2 fall down stairs (same fall as tibular fracture). S/P internal fixation.  . Duodenitis    EGD (07/14/2011) showing chronic duodenitis, H.pyloir neg  . Elevated liver enzymes   . Endometriosis    S/P TAH and salpingo-oophorectomy, right  . Epigastric pain    Chronic, thought 2/2 gastritis, EGD (07/14/2011) showing chronic duodenitis, H.pyloir neg // Repeat EGD (09/2011) - esophagealring, sessible polyp in stomach body, hiatal hernia - rec ad probiotic, await bx  . Esophageal ring    s/p dilatation during EGD (07/2011) - Dr. Oneida Alar  . Fatty liver   . Gastritis    Thought due to chronic NSAID use 2/2 pain from her congenital hip abnormality  . Headache(784.0)    Migraines  . Hidradenitis suppurativa 05/2006  . 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 With chronic OA of hip  . History of migraine headaches    Typical symptoms - bright lights, unilateral (starting behind her left ear), throbbing .  Marland Kitchen Hot flashes   . Hypertension   . Internal hemorrhoids   . Osteoarthritis of hip   . Ovarian cancer (  [place]) 1995  . Protrusio acetabuli    diagnosed at age 3  . Sessile colonic polyp    noted 09/2011 -- > rec colonoscopy in 10 years, 2023  . Surgical menopause 08/2007   On HRT. Occuring since 01/09 following TAH and R-salpingo-oophorectomy   . Tibial fracture 10/2008   Left - Sustained 2/2 fall down stairs. Patient is S/P closed internal fixation with Smith Nephew tibial nail locked proximal and  distal  . Vitamin D deficiency 11/2009   Vit D level 15 in 11/2009    Past Surgical History:  Procedure Laterality Date  . ABDOMINAL HYSTERECTOMY    . CESAREAN SECTION  08/2005   Primary low transverse  . COLONOSCOPY  09/22/2011   [id number] polyp in the rectum/Internal hemorrhoids, benign path  . ESOPHAGOGASTRODUODENOSCOPY  09/22/2011   [id number], esophageal/Sessile polyp in the body of the stomach/Hiatal hernia/ABDOMINAL PAIN LIKELY FUNCTIONAL V. POST-INFECTIOUS IBS, path: mild chronic gastritis  . FRACTURE SURGERY     Right hip  . JOINT REPLACEMENT     right hip  . LAPAROSCOPY  10/2000   Operative laparoscopy with lysis of right adnexal adhesions and uterointestinal adhesions  . left tib fib    . OTHER SURGICAL HISTORY  02/2009   Closed treatment internal fixation, left tibia with Tamala Julian and Nephew tibial nail locked proximal and distal  . TOTAL ABDOMINAL HYSTERECTOMY W/ BILATERAL SALPINGOOPHORECTOMY  08/2007   for endometriosis. With concurrent right salpingo-oophorectomy  (2009), left oophorectomy (1995)  . TUBAL LIGATION  08/2005   Right tubal ligation    Family History  Problem Relation Age of Onset  . Other Father        deceased while in service.   . Breast cancer Sister 46       remission  . Cancer Sister        breast  . Lupus Cousin   . Colon cancer Neg Hx   . Liver disease Neg Hx     Social History   Tobacco Use  . Smoking status: Never Smoker  . Smokeless tobacco: Never Used  Substance Use Topics  . Alcohol use: No    Alcohol/week: 0.0 standard drinks  . Drug use: No    Patient Active Problem List   Diagnosis Date Noted  . Concussion with no loss of consciousness 08/12/2018  . Acute pain of left shoulder 08/12/2018  . Acute right-sided low back pain without sciatica 08/12/2018  . Allergic rhinitis 08/05/2018  . Arthritis 07/01/2018  . DDD (degenerative disc disease), lumbar 05/21/2018  . Thoracic arthritis 05/21/2018  . DDD (degenerative disc  disease), cervical 01/06/2018  . Hidradenitis suppurativa 10/11/2017  . Chronic frontal sinusitis 10/11/2017  . Fatty liver 10/11/2017  . Chronic ethmoidal sinusitis 09/13/2017  . Photophobia of both eyes 09/13/2017  . Abnormal liver enzymes 09/13/2017  . History of hysterectomy with bilateral oophorectomy 06/24/2017  . Estrogen deficiency 06/24/2017  . Overweight 11/17/2016  . Abdominal pain 11/04/2016  . Headache 12/17/2015  . Hot flashes 02/20/2012  . Hiatal hernia 02/20/2012  . Chronic hip pain 02/20/2012  . Fatigue 12/19/2011  . Acetabulum intrapelvic protrusion into pelvic region or thigh 05/15/2011  . Annual physical exam 10/03/2010  . Hypertension 03/20/2010  . Vitamin D deficiency 08/20/2009  . Migraine headache 05/13/2006    Home Medications:    Current Meds  Medication Sig  . amLODipine (NORVASC) 10 MG tablet Take 1 tablet (10 mg total) by mouth daily.    Allergies:   Pineapple, Hctz [hydrochlorothiazide],  Latex, Lisinopril, Morphine and related, and Tape  Review of Systems (ROS): Review of Systems  Constitutional: Negative for fatigue and fever.  HENT: Positive for congestion, sinus pressure and sinus pain. Negative for ear pain, postnasal drip, rhinorrhea, sneezing, sore throat and trouble swallowing.   Eyes: Negative for pain, discharge and redness.  Respiratory: Negative for cough, chest tightness and shortness of breath.   Cardiovascular: Negative for chest pain and palpitations.  Gastrointestinal: Positive for abdominal pain. Negative for diarrhea, nausea and vomiting.  Genitourinary: Positive for dysuria, frequency and urgency. Negative for decreased urine volume, flank pain, hematuria, pelvic pain, vaginal bleeding, vaginal discharge and vaginal pain.  Musculoskeletal: Positive for back pain. Negative for arthralgias, myalgias and neck pain.  Skin: Negative for color change, pallor and rash.  Neurological: Positive for headaches. Negative for dizziness,  syncope and weakness.  Hematological: Negative for adenopathy.     Vital Signs: Today's Vitals   05/20/19 1608 05/20/19 1609 05/20/19 1612 05/20/19 1655  BP:   (!) 150/100   Pulse:   67   Resp:   16   Temp:   98 F (36.7 C)   TempSrc:   Oral   SpO2:   100%   Weight:  165 lb (74.8 kg)    Height:  5\' 6"  (1.676 m)    PainSc: 9    9     Physical Exam: Physical Exam  Constitutional: She is oriented to person, place, and time and well-developed, well-nourished, and in no distress. No distress.  HENT:  Head: Normocephalic and atraumatic.  Right Ear: Tympanic membrane normal.  Nose: Mucosal edema and sinus tenderness present.  Mouth/Throat: Uvula is midline and mucous membranes are normal. Posterior oropharyngeal erythema present. No posterior oropharyngeal edema.  Eyes: Pupils are equal, round, and reactive to light. Conjunctivae and EOM are normal.  Neck: Normal range of motion. Neck supple. No tracheal deviation present.  Cardiovascular: Normal rate, regular rhythm, normal heart sounds and intact distal pulses. Exam reveals no gallop and no friction rub.  No murmur heard. Pulmonary/Chest: Effort normal and breath sounds normal. No respiratory distress. She has no wheezes. She has no rales.  Abdominal: Soft. Bowel sounds are normal. She exhibits no distension. There is abdominal tenderness in the suprapubic area. There is CVA tenderness (LEFT).  Musculoskeletal: Normal range of motion.  Lymphadenopathy:       Head (right side): Submandibular (mild) adenopathy present.       Head (left side): Submandibular (mild) adenopathy present.  Neurological: She is alert and oriented to person, place, and time. Gait normal.  Skin: Skin is warm and dry. No rash noted. She is not diaphoretic.  Psychiatric: Mood, memory, affect and judgment normal.  Nursing note and vitals reviewed.   Urgent Care Treatments / Results:   LABS: PLEASE NOTE: all labs that were ordered this encounter are listed,  however only abnormal results are displayed. Labs Reviewed  URINALYSIS, COMPLETE (UACMP) WITH MICROSCOPIC - Abnormal; Notable for the following components:      Result Value   Color, Urine STRAW (*)    Hgb urine dipstick TRACE (*)    Leukocytes,Ua TRACE (*)    All other components within normal limits  URINE CULTURE    EKG: -None  RADIOLOGY: No results found.  PROCEDURES: Procedures  MEDICATIONS RECEIVED THIS VISIT: Medications - No data to display  PERTINENT CLINICAL COURSE NOTES/UPDATES:   Initial Impression / Assessment and Plan / Urgent Care Course:  Pertinent labs & imaging results that were  available during my care of the patient were personally reviewed by me and considered in my medical decision making (see lab/imaging section of note for values and interpretations).  Madline R Allen-Bruce is a 44 y.o. female who presents to [facility] today with complaints of sinus and urinary issues.  Patient is well appearing overall in clinic today. She does not appear to be in any acute distress. Presenting symptoms (see HPI) and exam as documented above.  Patient with sinus symptoms x2 weeks and urinary symptoms for the last week.  Patient was seen by her PCP yesterday, however failed to mention her sinus symptoms as she felt like her provider would have thought it was related to SARS-CoV-2 (novel coronavirus).  Patient notes worsening urinary symptoms since seen by her PCP.  A positive for trace leukocytes; 6-10 WBC/hpf.  Reflex culture sent of her urine.  Given the chronicity of her sinus symptoms, and reported failed attempts at conservative management using decongestants and fluticasone, will proceed with treatment for acute sinusitis.  Patient to be treated with a 10-day course of Augmentin, which will also cover the probable infection in her urine.  She was advised that if culture demonstrated significant growth of the pathogen that was resistant to the prescribed antibiotic,  she will be contacted with further directives regarding the need to change her current antimicrobial therapy.  Discussed supportive care at home. Patient was advised to avoid decongestants in efforts to prevent elevations of her blood pressure.  Discussed use of Coricidin HBP products as needed.  Patient encouraged to remain well-hydrated and to rest as much possible.  Discussed follow up with primary care physician in 1 week for re-evaluation. I have reviewed the follow up and strict return precautions for any new or worsening symptoms. Patient is aware of symptoms that would be deemed urgent/emergent, and would thus require further evaluation either here or in the emergency department. At the time of discharge, she verbalized understanding and consent with the discharge plan as it was reviewed with her. All questions were fielded by provider and/or clinic staff prior to patient discharge.    Final Clinical Impressions / Urgent Care Diagnoses:   Final diagnoses:  Urinary tract infection without hematuria, site unspecified  Acute non-recurrent sinusitis, unspecified location    New Prescriptions:  [place] Controlled Substance Registry consulted? Not Applicable  Meds ordered this encounter  Medications  . amoxicillin-clavulanate (AUGMENTIN) 875-125 MG tablet    Sig: Take 1 tablet by mouth 2 (two) times daily for 10 days.    Dispense:  20 tablet    Refill:  0    Recommended Follow up Care:  Patient encouraged to follow up with the following provider within the specified time frame, or sooner as dictated by the severity of her symptoms. As always, she was instructed that for any urgent/emergent care needs, she should seek care either here or in the emergency department for more immediate evaluation.  Follow-up Information    McLean-Scocuzza, Nino Glow, MD In 1 week.   Specialty: Internal Medicine Contact information: [place] [address] [phone]         NOTE: This  note was prepared using Dragon dictation software along with smaller phrase technology. Despite my best ability to proofread, there is the potential that transcriptional errors may still occur from this process, and are completely unintentional.    Karen Kitchens, NP 05/21/19 1721

## 2019-05-20 NOTE — Telephone Encounter (Signed)
Ok noted rec UC   [place]

## 2019-05-20 NOTE — Discharge Instructions (Signed)
It was very nice seeing you today in clinic. Thank you for entrusting me with your care.   As discussed, your urine is POSITIVE for infection. I also think you have a sinus infection. Will approach treatment as follows:  Prescription has been sent to your pharmacy for antibiotics.  Please pick up and take as directed. FINISH the entire course of medication even if you are feeling better.  A culture will be sent on your provided sample. If it comes back resistant to what I have prescribed you, someone will call you and let you know that we will need to change antibiotics. Increase fluid intake as much as possible to flush your urinary tract.  Water is always the best.  Avoid caffeine until your infection clears up, as it can contribute to painful bladder spasms.  May use Tylenol and/or Ibuprofen as needed for pain/fever. May use Azo products (over the counter) to help with pain/discomfort.   Make arrangements to follow up with your regular doctor in 1 week for re-evaluation. If your symptoms/condition worsens, please seek follow up care either here or in the ER. Please remember, our [place] providers are "right here with you" when you need [place].   Again, it was my pleasure to take care of you today. Thank you for choosing our clinic. I hope that you start to feel better quickly.   Honor Loh, MSN, APRN, FNP-C, CEN Advanced Practice Provider [facility]

## 2019-05-21 LAB — COMPREHENSIVE METABOLIC PANEL
ALT: 25 IU/L (ref 0–32)
AST: 19 IU/L (ref 0–40)
Albumin/Globulin Ratio: 1.4 (ref 1.2–2.2)
Albumin: 4.1 g/dL (ref 3.8–4.8)
Alkaline Phosphatase: 85 IU/L (ref 39–117)
BUN/Creatinine Ratio: 9 (ref 9–23)
BUN: 8 mg/dL (ref 6–24)
Bilirubin Total: 0.8 mg/dL (ref 0.0–1.2)
CO2: 24 mmol/L (ref 20–29)
Calcium: 9.6 mg/dL (ref 8.7–10.2)
Chloride: 103 mmol/L (ref 96–106)
Creatinine, Ser: 0.92 mg/dL (ref 0.57–1.00)
GFR calc Af Amer: 87 mL/min/{1.73_m2} (ref >59)
GFR calc non Af Amer: 75 mL/min/{1.73_m2} (ref >59)
Globulin, Total: 2.9 g/dL (ref 1.5–4.5)
Glucose: 86 mg/dL (ref 65–99)
Potassium: 4 mmol/L (ref 3.5–5.2)
Sodium: 140 mmol/L (ref 134–144)
Total Protein: 7 g/dL (ref 6.0–8.5)

## 2019-05-21 LAB — URINALYSIS, ROUTINE W REFLEX MICROSCOPIC
Bilirubin, UA: NEGATIVE
Glucose, UA: NEGATIVE
Ketones, UA: NEGATIVE
Nitrite, UA: NEGATIVE
Protein,UA: NEGATIVE
RBC, UA: NEGATIVE
Specific Gravity, UA: 1.019 (ref 1.005–1.030)
Urobilinogen, Ur: 0.2 mg/dL (ref 0.2–1.0)
pH, UA: 7 (ref 5.0–7.5)

## 2019-05-21 LAB — MICROSCOPIC EXAMINATION: Bacteria, UA: NONE SEEN

## 2019-05-21 LAB — LIPID PANEL
Chol/HDL Ratio: 2.8 ratio (ref 0.0–4.4)
Cholesterol, Total: 182 mg/dL (ref 100–199)
HDL: 64 mg/dL (ref >39)
LDL Chol Calc (NIH): 105 mg/dL — ABNORMAL HIGH (ref 0–99)
Triglycerides: 68 mg/dL (ref 0–149)
VLDL Cholesterol Cal: 13 mg/dL (ref 5–40)

## 2019-05-21 LAB — CBC WITH DIFFERENTIAL/PLATELET
Basophils Absolute: 0 10*3/uL (ref 0.0–0.2)
Basos: 0 %
EOS (ABSOLUTE): 0.1 10*3/uL (ref 0.0–0.4)
Eos: 1 %
Hematocrit: 36.9 % (ref 34.0–46.6)
Hemoglobin: 12.3 g/dL (ref 11.1–15.9)
Immature Grans (Abs): 0 10*3/uL (ref 0.0–0.1)
Immature Granulocytes: 0 %
Lymphocytes Absolute: 2.2 10*3/uL (ref 0.7–3.1)
Lymphs: 22 %
MCH: 27.8 pg (ref 26.6–33.0)
MCHC: 33.3 g/dL (ref 31.5–35.7)
MCV: 84 fL (ref 79–97)
Monocytes Absolute: 0.8 10*3/uL (ref 0.1–0.9)
Monocytes: 8 %
Neutrophils Absolute: 6.8 10*3/uL (ref 1.4–7.0)
Neutrophils: 69 %
Platelets: 348 10*3/uL (ref 150–450)
RBC: 4.42 x10E6/uL (ref 3.77–5.28)
RDW: 14.3 % (ref 11.7–15.4)
WBC: 9.9 10*3/uL (ref 3.4–10.8)

## 2019-05-21 LAB — TSH: TSH: 2.09 u[IU]/mL (ref 0.450–4.500)

## 2019-05-21 LAB — VITAMIN D 25 HYDROXY (VIT D DEFICIENCY, FRACTURES): Vit D, 25-Hydroxy: 31.1 ng/mL (ref 30.0–100.0)

## 2019-05-21 LAB — MAGNESIUM: Magnesium: 2.1 mg/dL (ref 1.6–2.3)

## 2019-05-22 LAB — URINE CULTURE

## 2019-05-24 NOTE — Progress Notes (Signed)
Notes have been faxed electronically to providers

## 2019-05-25 ENCOUNTER — Emergency Department: Payer: BC Managed Care – PPO

## 2019-05-25 ENCOUNTER — Other Ambulatory Visit: Payer: Self-pay

## 2019-05-25 ENCOUNTER — Emergency Department
Admission: EM | Admit: 2019-05-25 | Discharge: 2019-05-25 | Disposition: A | Discharge status: Home / Self Care | Payer: BC Managed Care – PPO | Attending: Student | Admitting: Student

## 2019-05-25 ENCOUNTER — Encounter: Payer: Self-pay | Admitting: *Deleted

## 2019-05-25 DIAGNOSIS — S161XXA Strain of muscle, fascia and tendon at neck level, initial encounter: Secondary | ICD-10-CM | POA: Diagnosis not present

## 2019-05-25 DIAGNOSIS — Y92410 Unspecified street and highway as the place of occurrence of the external cause: Secondary | ICD-10-CM | POA: Insufficient documentation

## 2019-05-25 DIAGNOSIS — Z91040 Latex allergy status: Secondary | ICD-10-CM | POA: Insufficient documentation

## 2019-05-25 DIAGNOSIS — S3992XA Unspecified injury of lower back, initial encounter: Secondary | ICD-10-CM | POA: Diagnosis not present

## 2019-05-25 DIAGNOSIS — M545 Low back pain, unspecified: Secondary | ICD-10-CM

## 2019-05-25 DIAGNOSIS — R519 Headache, unspecified: Secondary | ICD-10-CM | POA: Diagnosis not present

## 2019-05-25 DIAGNOSIS — S0990XA Unspecified injury of head, initial encounter: Secondary | ICD-10-CM | POA: Diagnosis not present

## 2019-05-25 DIAGNOSIS — G44319 Acute post-traumatic headache, not intractable: Secondary | ICD-10-CM

## 2019-05-25 DIAGNOSIS — Y999 Unspecified external cause status: Secondary | ICD-10-CM | POA: Diagnosis not present

## 2019-05-25 DIAGNOSIS — Y93I9 Activity, other involving external motion: Secondary | ICD-10-CM | POA: Diagnosis not present

## 2019-05-25 DIAGNOSIS — Z96641 Presence of right artificial hip joint: Secondary | ICD-10-CM | POA: Insufficient documentation

## 2019-05-25 DIAGNOSIS — S79912A Unspecified injury of left hip, initial encounter: Secondary | ICD-10-CM | POA: Diagnosis not present

## 2019-05-25 DIAGNOSIS — S199XXA Unspecified injury of neck, initial encounter: Secondary | ICD-10-CM | POA: Diagnosis not present

## 2019-05-25 DIAGNOSIS — M25551 Pain in right hip: Secondary | ICD-10-CM | POA: Insufficient documentation

## 2019-05-25 DIAGNOSIS — I10 Essential (primary) hypertension: Secondary | ICD-10-CM | POA: Insufficient documentation

## 2019-05-25 DIAGNOSIS — M542 Cervicalgia: Secondary | ICD-10-CM | POA: Diagnosis not present

## 2019-05-25 DIAGNOSIS — M25552 Pain in left hip: Secondary | ICD-10-CM | POA: Insufficient documentation

## 2019-05-25 MED ORDER — METHOCARBAMOL 500 MG PO TABS: 500.0000 mg | ORAL_TABLET | Freq: Four times a day (QID) | ORAL | 0 refills | Status: DC | PRN

## 2019-05-25 MED ORDER — TRAMADOL HCL 50 MG PO TABS
50.0000 mg | ORAL_TABLET | Freq: Once | ORAL | Status: AC
  Administered 2019-05-25: 50 mg via ORAL
  Filled 2019-05-25: qty 1

## 2019-05-25 MED ORDER — TRAMADOL HCL 50 MG PO TABS: 50.0000 mg | ORAL_TABLET | Freq: Four times a day (QID) | ORAL | 0 refills | Status: DC | PRN
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
## 2019-05-27 ENCOUNTER — Other Ambulatory Visit: Payer: Self-pay

## 2019-05-27 ENCOUNTER — Ambulatory Visit: Payer: BC Managed Care – PPO | Admitting: Internal Medicine

## 2019-05-30 DIAGNOSIS — Z8719 Personal history of other diseases of the digestive system: Secondary | ICD-10-CM | POA: Diagnosis not present

## 2019-05-30 DIAGNOSIS — Z86010 Personal history of colonic polyps: Secondary | ICD-10-CM | POA: Diagnosis not present

## 2019-05-30 DIAGNOSIS — N951 Menopausal and female climacteric states: Secondary | ICD-10-CM | POA: Diagnosis not present

## 2019-05-30 DIAGNOSIS — Z01419 Encounter for gynecological examination (general) (routine) without abnormal findings: Secondary | ICD-10-CM | POA: Diagnosis not present

## 2019-05-30 DIAGNOSIS — Z1211 Encounter for screening for malignant neoplasm of colon: Secondary | ICD-10-CM | POA: Diagnosis not present

## 2019-05-30 DIAGNOSIS — Z538 Procedure and treatment not carried out for other reasons: Secondary | ICD-10-CM | POA: Diagnosis not present

## 2019-05-30 DIAGNOSIS — R748 Abnormal levels of other serum enzymes: Secondary | ICD-10-CM | POA: Diagnosis not present

## 2019-05-30 DIAGNOSIS — Z6828 Body mass index (BMI) 28.0-28.9, adult: Secondary | ICD-10-CM | POA: Diagnosis not present

## 2019-05-31 ENCOUNTER — Encounter: Payer: Self-pay | Admitting: Internal Medicine

## 2019-05-31 ENCOUNTER — Other Ambulatory Visit: Payer: Self-pay

## 2019-05-31 ENCOUNTER — Ambulatory Visit (INDEPENDENT_AMBULATORY_CARE_PROVIDER_SITE_OTHER): Payer: BC Managed Care – PPO | Admitting: Internal Medicine

## 2019-05-31 VITALS — BP 154/80 | HR 85 | Temp 98.1°F | Ht 72.0 in | Wt 200.0 lb

## 2019-05-31 DIAGNOSIS — M545 Low back pain, unspecified: Secondary | ICD-10-CM

## 2019-05-31 DIAGNOSIS — M542 Cervicalgia: Secondary | ICD-10-CM

## 2019-05-31 DIAGNOSIS — M25552 Pain in left hip: Secondary | ICD-10-CM

## 2019-05-31 DIAGNOSIS — M25551 Pain in right hip: Secondary | ICD-10-CM

## 2019-05-31 DIAGNOSIS — I10 Essential (primary) hypertension: Secondary | ICD-10-CM

## 2019-05-31 MED ORDER — CYCLOBENZAPRINE HCL 5 MG PO TABS: 5.0000 mg | ORAL_TABLET | Freq: Every evening | ORAL | 2 refills | Status: DC | PRN

## 2019-05-31 MED ORDER — AMLODIPINE BESYLATE 10 MG PO TABS: 10.0000 mg | ORAL_TABLET | Freq: Every day | ORAL | 3 refills | Status: DC

## 2019-05-31 MED ORDER — TRAMADOL HCL 50 MG PO TABS: 50.0000 mg | ORAL_TABLET | Freq: Three times a day (TID) | ORAL | 0 refills | Status: DC | PRN
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
## 2019-05-31 NOTE — Addendum Note (Signed)
Addended by: Orland Mustard on: 05/31/2019 05:35 PM   Modules accepted: Orders

## 2019-05-31 NOTE — Patient Instructions (Signed)
Lidocaine patches for neck hip and back  Change every 12-24 hours  Flexeril 1-2 pill at night as needed muscle relaxer  Tramadol for pain up to 3x per day try it 1st at night  PT [place] [facility]  Buy blood pressure cuff on [employer]  Back Exercises The following exercises strengthen the muscles that help to support the trunk and back. They also help to keep the lower back flexible. Doing these exercises can help to prevent back pain or lessen existing pain.  If you have back pain or discomfort, try doing these exercises 2-3 times each day or as told by your health care provider.  As your pain improves, do them once each day, but increase the number of times that you repeat the steps for each exercise (do more repetitions).  To prevent the recurrence of back pain, continue to do these exercises once each day or as told by your health care provider. Do exercises exactly as told by your health care provider and adjust them as directed. It is normal to feel mild stretching, pulling, tightness, or discomfort as you do these exercises, but you should stop right away if you feel sudden pain or your pain gets worse. Exercises Single knee to chest Repeat these steps 3-5 times for each leg: 1. Lie on your back on a firm bed or the floor with your legs extended. 2. Bring one knee to your chest. Your other leg should stay extended and in contact with the floor. 3. Hold your knee in place by grabbing your knee or thigh with both hands and hold. 4. Pull on your knee until you feel a gentle stretch in your lower back or buttocks. 5. Hold the stretch for 10-30 seconds. 6. Slowly release and straighten your leg. Pelvic tilt Repeat these steps 5-10 times: 1. Lie on your back on a firm bed or the floor with your legs extended. 2. Bend your knees so they are pointing toward the ceiling and your feet are flat on the floor. 3. Tighten your lower abdominal muscles to press your lower back against the floor.  This motion will tilt your pelvis so your tailbone points up toward the ceiling instead of pointing to your feet or the floor. 4. With gentle tension and even breathing, hold this position for 5-10 seconds. Cat-cow Repeat these steps until your lower back becomes more flexible: 1. Get into a hands-and-knees position on a firm surface. Keep your hands under your shoulders, and keep your knees under your hips. You may place padding under your knees for comfort. 2. Let your head hang down toward your chest. Contract your abdominal muscles and point your tailbone toward the floor so your lower back becomes rounded like the back of a cat. 3. Hold this position for 5 seconds. 4. Slowly lift your head, let your abdominal muscles relax and point your tailbone up toward the ceiling so your back forms a sagging arch like the back of a cow. 5. Hold this position for 5 seconds.  Press-ups Repeat these steps 5-10 times: 1. Lie on your abdomen (face-down) on the floor. 2. Place your palms near your head, about shoulder-width apart. 3. Keeping your back as relaxed as possible and keeping your hips on the floor, slowly straighten your arms to raise the top half of your body and lift your shoulders. Do not use your back muscles to raise your upper torso. You may adjust the placement of your hands to make yourself more comfortable. 4.  Hold this position for 5 seconds while you keep your back relaxed. 5. Slowly return to lying flat on the floor.  Bridges Repeat these steps 10 times: 1. Lie on your back on a firm surface. 2. Bend your knees so they are pointing toward the ceiling and your feet are flat on the floor. Your arms should be flat at your sides, next to your body. 3. Tighten your buttocks muscles and lift your buttocks off the floor until your waist is at almost the same height as your knees. You should feel the muscles working in your buttocks and the back of your thighs. If you do not feel these  muscles, slide your feet 1-2 inches farther away from your buttocks. 4. Hold this position for 3-5 seconds. 5. Slowly lower your hips to the starting position, and allow your buttocks muscles to relax completely. If this exercise is too easy, try doing it with your arms crossed over your chest. Abdominal crunches Repeat these steps 5-10 times: 1. Lie on your back on a firm bed or the floor with your legs extended. 2. Bend your knees so they are pointing toward the ceiling and your feet are flat on the floor. 3. Cross your arms over your chest. 4. Tip your chin slightly toward your chest without bending your neck. 5. Tighten your abdominal muscles and slowly raise your trunk (torso) high enough to lift your shoulder blades a tiny bit off the floor. Avoid raising your torso higher than that because it can put too much stress on your low back and does not help to strengthen your abdominal muscles. 6. Slowly return to your starting position. Back lifts Repeat these steps 5-10 times: 1. Lie on your abdomen (face-down) with your arms at your sides, and rest your forehead on the floor. 2. Tighten the muscles in your legs and your buttocks. 3. Slowly lift your chest off the floor while you keep your hips pressed to the floor. Keep the back of your head in line with the curve in your back. Your eyes should be looking at the floor. 4. Hold this position for 3-5 seconds. 5. Slowly return to your starting position. Contact a health care provider if:  Your back pain or discomfort gets much worse when you do an exercise.  Your worsening back pain or discomfort does not lessen within 2 hours after you exercise. If you have any of these problems, stop doing these exercises right away. Do not do them again unless your health care provider says that you can. Get help right away if:  You develop sudden, severe back pain. If this happens, stop doing the exercises right away. Do not do them again unless your  health care provider says that you can. This information is not intended to replace advice given to you by your health care provider. Make sure you discuss any questions you have with your health care provider. Document Released: 09/04/2004 Document Revised: 12/02/2018 Document Reviewed: 04/29/2018 Elsevier Patient Education  2020 [employer].  Neck Exercises Ask your health care provider which exercises are safe for you. Do exercises exactly as told by your health care provider and adjust them as directed. It is normal to feel mild stretching, pulling, tightness, or discomfort as you do these exercises. Stop right away if you feel sudden pain or your pain gets worse. Do not begin these exercises until told by your health care provider. Neck exercises can be important for many reasons. They can improve strength and  maintain flexibility in your neck, which will help your upper back and prevent neck pain. Stretching exercises Rotation neck stretching  1. Sit in a chair or stand up. 2. Place your feet flat on the floor, shoulder width apart. 3. Slowly turn your head (rotate) to the right until a slight stretch is felt. Turn it all the way to the right so you can look over your right shoulder. Do not tilt or tip your head. 4. Hold this position for 10-30 seconds. 5. Slowly turn your head (rotate) to the left until a slight stretch is felt. Turn it all the way to the left so you can look over your left shoulder. Do not tilt or tip your head. 6. Hold this position for 10-30 seconds. Repeat __________ times. Complete this exercise __________ times a day. Neck retraction 1. Sit in a sturdy chair or stand up. 2. Look straight ahead. Do not bend your neck. 3. Use your fingers to push your chin backward (retraction). Do not bend your neck for this movement. Continue to face straight ahead. If you are doing the exercise properly, you will feel a slight sensation in your throat and a stretch at the back of  your neck. 4. Hold the stretch for 1-2 seconds. Repeat __________ times. Complete this exercise __________ times a day. Strengthening exercises Neck press 1. Lie on your back on a firm bed or on the floor with a pillow under your head. 2. Use your neck muscles to push your head down on the pillow and straighten your spine. 3. Hold the position as well as you can. Keep your head facing up (in a neutral position) and your chin tucked. 4. Slowly count to 5 while holding this position. Repeat __________ times. Complete this exercise __________ times a day. Isometrics These are exercises in which you strengthen the muscles in your neck while keeping your neck still (isometrics). 1. Sit in a supportive chair and place your hand on your forehead. 2. Keep your head and face facing straight ahead. Do not flex or extend your neck while doing isometrics. 3. Push forward with your head and neck while pushing back with your hand. Hold for 10 seconds. 4. Do the sequence again, this time putting your hand against the back of your head. Use your head and neck to push backward against the hand pressure. 5. Finally, do the same exercise on either side of your head, pushing sideways against the pressure of your hand. Repeat __________ times. Complete this exercise __________ times a day. Prone head lifts 1. Lie face-down (prone position), resting on your elbows so that your chest and upper back are raised. 2. Start with your head facing downward, near your chest. Position your chin either on or near your chest. 3. Slowly lift your head upward. Lift until you are looking straight ahead. Then continue lifting your head as far back as you can comfortably stretch. 4. Hold your head up for 5 seconds. Then slowly lower it to your starting position. Repeat __________ times. Complete this exercise __________ times a day. Supine head lifts 1. Lie on your back (supine position), bending your knees to point to the  ceiling and keeping your feet flat on the floor. 2. Lift your head slowly off the floor, raising your chin toward your chest. 3. Hold for 5 seconds. Repeat __________ times. Complete this exercise __________ times a day. Scapular retraction 1. Stand with your arms at your sides. Look straight ahead. 2. Slowly pull both shoulders (  scapulae) backward and downward (retraction) until you feel a stretch between your shoulder blades in your upper back. 3. Hold for 10-30 seconds. 4. Relax and repeat. Repeat __________ times. Complete this exercise __________ times a day. Contact a health care provider if:  Your neck pain or discomfort gets much worse when you do an exercise.  Your neck pain or discomfort does not improve within 2 hours after you exercise. If you have any of these problems, stop exercising right away. Do not do the exercises again unless your health care provider says that you can. Get help right away if:  You develop sudden, severe neck pain. If this happens, stop exercising right away. Do not do the exercises again unless your health care provider says that you can. This information is not intended to replace advice given to you by your health care provider. Make sure you discuss any questions you have with your health care provider. Document Released: 07/09/2015 Document Revised: 05/26/2018 Document Reviewed: 05/26/2018 Elsevier Patient Education  2020 [employer].

## 2019-06-02 DIAGNOSIS — Z471 Aftercare following joint replacement surgery: Secondary | ICD-10-CM | POA: Diagnosis not present

## 2019-06-02 DIAGNOSIS — M25552 Pain in left hip: Secondary | ICD-10-CM | POA: Diagnosis not present

## 2019-06-02 DIAGNOSIS — Z96643 Presence of artificial hip joint, bilateral: Secondary | ICD-10-CM | POA: Diagnosis not present

## 2019-06-02 DIAGNOSIS — Z96641 Presence of right artificial hip joint: Secondary | ICD-10-CM | POA: Diagnosis not present

## 2019-06-02 DIAGNOSIS — Z96642 Presence of left artificial hip joint: Secondary | ICD-10-CM | POA: Diagnosis not present

## 2019-06-02 DIAGNOSIS — M461 Sacroiliitis, not elsewhere classified: Secondary | ICD-10-CM | POA: Diagnosis not present

## 2019-06-02 DIAGNOSIS — M25551 Pain in right hip: Secondary | ICD-10-CM | POA: Diagnosis not present

## 2019-06-02 DIAGNOSIS — M7981 Nontraumatic hematoma of soft tissue: Secondary | ICD-10-CM | POA: Diagnosis not present

## 2019-06-07 ENCOUNTER — Other Ambulatory Visit: Payer: Self-pay

## 2019-06-08 ENCOUNTER — Ambulatory Visit: Payer: BC Managed Care – PPO | Admitting: Internal Medicine

## 2019-06-08 ENCOUNTER — Other Ambulatory Visit: Payer: Self-pay

## 2019-06-08 ENCOUNTER — Encounter: Payer: Medicare Other | Admitting: Internal Medicine

## 2019-06-15 ENCOUNTER — Telehealth (HOSPITAL_COMMUNITY): Payer: Self-pay

## 2019-06-15 ENCOUNTER — Ambulatory Visit (HOSPITAL_COMMUNITY): Payer: BC Managed Care – PPO

## 2019-06-15 NOTE — Telephone Encounter (Signed)
pt called to cancel due to she has no transportation.

## 2019-06-17 ENCOUNTER — Ambulatory Visit
Admission: EM | Admit: 2019-06-17 | Discharge: 2019-06-17 | Disposition: A | Discharge status: Home / Self Care | Payer: BC Managed Care – PPO | Attending: Family Medicine | Admitting: Family Medicine

## 2019-06-17 ENCOUNTER — Other Ambulatory Visit: Payer: Self-pay

## 2019-06-17 DIAGNOSIS — M542 Cervicalgia: Secondary | ICD-10-CM

## 2019-06-17 DIAGNOSIS — M545 Low back pain, unspecified: Secondary | ICD-10-CM

## 2019-06-17 MED ORDER — HYDROCODONE-ACETAMINOPHEN 5-325 MG PO TABS: ORAL_TABLET | ORAL | 0 refills | Status: DC

## 2019-06-17 MED ORDER — MELOXICAM 15 MG PO TABS: 15.0000 mg | ORAL_TABLET | Freq: Every day | ORAL | 0 refills | Status: DC
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
## 2019-06-20 ENCOUNTER — Other Ambulatory Visit: Payer: Self-pay

## 2019-06-20 ENCOUNTER — Ambulatory Visit (HOSPITAL_COMMUNITY): Payer: BC Managed Care – PPO | Attending: Internal Medicine | Admitting: Physical Therapy

## 2019-06-20 ENCOUNTER — Encounter (HOSPITAL_COMMUNITY): Payer: Self-pay | Admitting: Physical Therapy

## 2019-06-20 DIAGNOSIS — M545 Low back pain, unspecified: Secondary | ICD-10-CM

## 2019-06-20 DIAGNOSIS — M542 Cervicalgia: Secondary | ICD-10-CM | POA: Diagnosis not present

## 2019-06-20 DIAGNOSIS — M25551 Pain in right hip: Secondary | ICD-10-CM

## 2019-06-20 DIAGNOSIS — R2689 Other abnormalities of gait and mobility: Secondary | ICD-10-CM | POA: Diagnosis not present

## 2019-06-20 DIAGNOSIS — M25552 Pain in left hip: Secondary | ICD-10-CM | POA: Diagnosis not present

## 2019-06-20 DIAGNOSIS — M6281 Muscle weakness (generalized): Secondary | ICD-10-CM | POA: Diagnosis not present
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
## 2019-06-20 NOTE — Addendum Note (Signed)
Addended by: Josue Hector A on: 06/20/2019 06:29 PM   Modules accepted: Orders

## 2019-06-21 DIAGNOSIS — F331 Major depressive disorder, recurrent, moderate: Secondary | ICD-10-CM | POA: Diagnosis not present

## 2019-06-22 ENCOUNTER — Ambulatory Visit (HOSPITAL_COMMUNITY): Payer: BC Managed Care – PPO

## 2019-06-22 ENCOUNTER — Other Ambulatory Visit: Payer: Self-pay

## 2019-06-22 ENCOUNTER — Encounter (HOSPITAL_COMMUNITY): Payer: Self-pay

## 2019-06-22 DIAGNOSIS — R2689 Other abnormalities of gait and mobility: Secondary | ICD-10-CM | POA: Diagnosis not present

## 2019-06-22 DIAGNOSIS — M6281 Muscle weakness (generalized): Secondary | ICD-10-CM | POA: Diagnosis not present

## 2019-06-22 DIAGNOSIS — M25551 Pain in right hip: Secondary | ICD-10-CM | POA: Diagnosis not present

## 2019-06-22 DIAGNOSIS — M542 Cervicalgia: Secondary | ICD-10-CM

## 2019-06-22 DIAGNOSIS — M25552 Pain in left hip: Secondary | ICD-10-CM

## 2019-06-22 DIAGNOSIS — M545 Low back pain, unspecified: Secondary | ICD-10-CM

## 2019-06-22 NOTE — Patient Instructions (Addendum)
Stretch Break - Shoulder Roll    Roll shoulders forward, up, back, and down to complete a circle 10 times.  Repeat 10 times every 3 hours.  Copyright  VHI. All rights reserved.   SCAPULA: Retraction    Hold cane with both hands. Pinch shoulder blades together. Do not shrug shoulders. Hold ___ seconds. Use___ lb weight on cane. ___ reps per set, ___ sets per day, ___ days per week  Copyright  VHI. All rights reserved.

## 2019-06-22 NOTE — Therapy (Signed)
[place], [place], [address] Phone: [phone]   Fax:  [phone]  Physical Therapy Treatment  Patient Details  Name: [name] MRN: [id number] Date of Birth: 1976/07/16 Referring Provider (PT): McLean-Scocuzza, Olivia Mackie   Encounter Date: 06/22/2019  PT End of Session - 06/22/19 1715    Visit Number  2    Number of Visits  18    Date for PT Re-Evaluation  08/01/19    Authorization Type  60 visit limit    Authorization Time Period  06/20/19-08/01/19    Authorization - Visit Number  2    Authorization - Number of Visits  60    PT Start Time  1703    PT Stop Time  [id number]    PT Time Calculation (min)  45 min    Activity Tolerance  Patient limited by pain    Behavior During Therapy  [facility] for tasks assessed/performed       Past Medical History:  Diagnosis Date  . Abdominal pain, epigastric 12/17/2015  . Anxiety    pt denies having dx of anxiety  . BCC (basal cell carcinoma of skin)    right foot   . Bunion, right foot   . Chronic cough   . Closed fibular fracture 10/2008   Left - Sustained 2/2 fall down stairs (same fall as tibular fracture). S/P internal fixation.  . Duodenitis    EGD (07/14/2011) showing chronic duodenitis, H.pyloir neg  . Elevated liver enzymes   . Endometriosis    S/P TAH and salpingo-oophorectomy, right  . Epigastric pain    Chronic, thought 2/2 gastritis, EGD (07/14/2011) showing chronic duodenitis, H.pyloir neg // Repeat EGD (09/2011) - esophagealring, sessible polyp in stomach body, hiatal hernia - rec ad probiotic, await bx  . Esophageal ring    s/p dilatation during EGD (07/2011) - Dr. Oneida Alar  . Fatty liver   . Gastritis    Thought due to chronic NSAID use 2/2 pain from her congenital hip abnormality  . Headache(784.0)    Migraines  . Hidradenitis suppurativa 05/2006  . Hip deformity, congenital    Protrusio acetabuli with articular sclerosis and flattening of femoral  heads. With chronic OA of hip  . History of migraine headaches    Typical symptoms - bright lights, unilateral (starting behind her left ear), throbbing .  Marland Kitchen Hot flashes   . Hypertension   . Internal hemorrhoids   . Osteoarthritis of hip   . Ovarian cancer ([place]) 1995  . Protrusio acetabuli    diagnosed at age 43  . Sessile colonic polyp    noted 09/2011 -- > rec colonoscopy in 10 years, 2023  . Surgical menopause 08/2007   On HRT. Occuring since 01/09 following TAH and R-salpingo-oophorectomy   . Tibial fracture 10/2008   Left - Sustained 2/2 fall down stairs. Patient is S/P closed internal fixation with Smith Nephew tibial nail locked proximal and distal  . Vitamin D deficiency 11/2009   Vit D level 15 in 11/2009    Past Surgical History:  Procedure Laterality Date  . ABDOMINAL HYSTERECTOMY    . CESAREAN SECTION  08/2005   Primary low transverse  . COLONOSCOPY  09/22/2011   [id number] polyp in the rectum/Internal hemorrhoids, benign path  . ESOPHAGOGASTRODUODENOSCOPY  09/22/2011   [id number], esophageal/Sessile polyp in the body of the stomach/Hiatal hernia/ABDOMINAL PAIN LIKELY FUNCTIONAL V. POST-INFECTIOUS IBS, path: mild chronic gastritis  . FRACTURE SURGERY     Right hip  .  JOINT REPLACEMENT     right hip  . LAPAROSCOPY  10/2000   Operative laparoscopy with lysis of right adnexal adhesions and uterointestinal adhesions  . left tib fib    . OTHER SURGICAL HISTORY  02/2009   Closed treatment internal fixation, left tibia with Tamala Julian and Nephew tibial nail locked proximal and distal  . TOTAL ABDOMINAL HYSTERECTOMY W/ BILATERAL SALPINGOOPHORECTOMY  08/2007   for endometriosis. With concurrent right salpingo-oophorectomy  (2009), left oophorectomy (1995)  . TUBAL LIGATION  08/2005   Right tubal ligation    There were no vitals filed for this visit.  Subjective Assessment - 06/22/19 1708    Subjective  Pt arrived ambulating with standard walker, reoprts neck and LBP, pain  scale constant 8-9/10 intermittent sharp pain    Pertinent History  Bilateral THA    Patient Stated Goals  Get back to normal    Currently in Pain?  Yes    Pain Score  9     Pain Location  Neck    Pain Orientation  Right;Left    Pain Descriptors / Indicators  Sharp;Aching    Pain Type  Acute pain    Pain Onset  1 to 4 weeks ago    Pain Frequency  Constant    Aggravating Factors   Lying flat, proloned sitting    Pain Relieving Factors  laying on rolling pin/foam roll    Effect of Pain on Daily Activities  limits                       [facility] Adult PT Treatment/Exercise - 06/22/19 0001      Ambulation/Gait   Ambulation/Gait  Yes    Ambulation Distance (Feet)  50 Feet    Assistive device  Rolling walker    Gait Pattern  Step-to pattern;Decreased stride length;Decreased hip/knee flexion - left;Decreased dorsiflexion - left;Trunk flexed;Poor foot clearance - left;Poor foot clearance - right;Antalgic    Gait Comments  RW with appropriate height and gait training       Exercises   Exercises  Neck      Neck Exercises: Seated   Shoulder Rolls  Backwards;10 reps    Shoulder Rolls Limitations  up, back and down    Postural Training  Educated proper posture    Other Seated Exercise  3D cervical excursion, slow and pain free range 10x each    Other Seated Exercise  scapular retraciton      Manual Therapy   Manual Therapy  Soft tissue mobilization    Manual therapy comments  Manual complete separate than rest of tx    Soft tissue mobilization  Upper trap, paraspinals and general cervical mm             PT Education - 06/22/19 1716    Education Details  Reviewed goals, established HEP.  Educated pt on appropriate AD (RW vs standard walker) to improve gait mechanics, balance and reduce stress on UE.    Person(s) Educated  Patient    Methods  Explanation    Comprehension  Verbalized understanding;Need further instruction       PT Short Term Goals - 06/20/19 1809       PT SHORT TERM GOAL #1   Title  Patient will be independant with initial HEP to improve functional outcomes    Time  3    Period  Weeks    Status  New    Target Date  07/11/19  PT SHORT TERM GOAL #2   Title  Patient will report less than 7/10 pain globally, on average, at rest, to improve daily function    Time  3    Period  Weeks    Status  New    Target Date  07/11/19        PT Long Term Goals - 06/20/19 1811      PT LONG TERM GOAL #1   Title  Patient will acheive >150 feet using LRAD with 2MWT in order to improve functional mobility in community.    Baseline  60 feet    Time  6    Period  Weeks    Status  New    Target Date  08/01/19      PT LONG TERM GOAL #2   Title  Patient will perform 5x STS in <30 seconds to improve functional mobility transfers.    Baseline  STS x 1: 16 seconds    Time  6    Period  Weeks    Status  New    Target Date  08/01/19      PT LONG TERM GOAL #3   Title  Patient will be able to perform staggered stance with both feet >20 seconds in oreder to improve stability with functional mobility and reduce risk of falls.    Baseline  unable    Time  6    Period  Weeks    Status  New    Target Date  08/01/19      PT LONG TERM GOAL #4   Title  Patient will improve cervical rotation by 10 degrees in each direction in order to scan environment and improve safetry awareness with funcitonal activity and ADLs.    Time  6    Period  Weeks    Status  New    Target Date  08/01/19            Plan - 06/22/19 1754    Clinical Impression Statement  Pt arrived ambulating with standard walker that was too short.  Attempted to increase height but unable with walker.  Pt educated on proper height and benefits with RW to reduce UE having to lift to advanced, pt reports increased ease with RW.  Reviewed goals.  Session focus with cervical mobility.  Established HEP to address cervical mobility and postural strengthening.  Pt educated on proper  posture for pain control.  EOS with manual soft tissue mobilization to address restricitons with moderate tension noted in upper traps, paraspinals and cervical muscuature wiht reports of pain reduced to 7/10.    Personal Factors and Comorbidities  Comorbidity 2    Comorbidities  Globalized pain in multiple sites    Examination-Activity Limitations  Bed Mobility;Sleep;Bend;Squat;Bathing;Sit;Carry;Stand;Dressing;Transfers;Lift;Locomotion Level;Stairs    Examination-Participation Restrictions  Cleaning;Community Activity;Dorita Sciara    Stability/Clinical Decision Making  Evolving/Moderate complexity    Clinical Decision Making  Moderate    Rehab Potential  Fair    PT Frequency  3x / week    PT Duration  6 weeks    PT Treatment/Interventions  ADLs/Self Care Home Management;DME Instruction;Biofeedback;Gait training;Stair training;Cryotherapy;Electrical Stimulation;Functional mobility training;Therapeutic activities;Moist Heat;Therapeutic exercise;Balance training;Manual techniques;Taping;Wheelchair mobility training;Orthotic Fit/Training;Patient/family education;Energy conservation;Joint Manipulations;Spinal Manipulations;Passive range of motion;Neuromuscular re-education    PT Next Visit Plan  F/U with appropriate AD and complaince wiht HEP.  Continue STM to neck area, begin cervical AROM, scapular retraction, shoulder rolls. Add and progress standing BLE strengthening as tolerated, (patient does not tolerate supine postition  per self report).    PT Home Exercise Plan  06/22/19: 3D cervical excursion, shoulder rolls and scapular retraction.       Patient will benefit from skilled therapeutic intervention in order to improve the following deficits and impairments:  Abnormal gait, Decreased endurance, Decreased strength, Decreased activity tolerance, Decreased balance, Decreased mobility, Difficulty walking, Pain, Decreased range of motion, Impaired flexibility, Improper body mechanics, Postural  dysfunction  Visit Diagnosis: Cervicalgia  Acute left-sided low back pain without sciatica  Pain in right hip  Pain in left hip  Other abnormalities of gait and mobility  Muscle weakness (generalized)     Problem List Patient Active Problem List   Diagnosis Date Noted  . Concussion with no loss of consciousness 08/12/2018  . Acute pain of left shoulder 08/12/2018  . Low back pain 08/12/2018  . Allergic rhinitis 08/05/2018  . Arthritis 07/01/2018  . DDD (degenerative disc disease), lumbar 05/21/2018  . Thoracic arthritis 05/21/2018  . DDD (degenerative disc disease), cervical 01/06/2018  . Hidradenitis suppurativa 10/11/2017  . Chronic frontal sinusitis 10/11/2017  . Fatty liver 10/11/2017  . Chronic ethmoidal sinusitis 09/13/2017  . Photophobia of both eyes 09/13/2017  . Abnormal liver enzymes 09/13/2017  . History of hysterectomy with bilateral oophorectomy 06/24/2017  . Estrogen deficiency 06/24/2017  . Overweight 11/17/2016  . Abdominal pain 11/04/2016  . Headache 12/17/2015  . Chronic constipation 10/12/2012  . Hot flashes 02/20/2012  . Hiatal hernia 02/20/2012  . Chronic pain of both hips 02/20/2012  . Fatigue 12/19/2011  . Acetabulum intrapelvic protrusion into pelvic region or thigh 05/15/2011  . Annual physical exam 10/03/2010  . Hypertension 03/20/2010  . Vitamin D deficiency 08/20/2009  . Anxiety 05/18/2009  . Migraine headache 05/13/2006   Ihor Austin, [place]; [place]  Aldona Lento 06/22/2019, 7:00 PM  [place] [address] [place], [place], [address] Phone: [phone]   Fax:  [phone]  Name: ADLEN ALPERT MRN: [id number] Date of Birth: 11/12/1975

## 2019-06-24 ENCOUNTER — Ambulatory Visit (HOSPITAL_COMMUNITY): Payer: BC Managed Care – PPO

## 2019-06-24 ENCOUNTER — Encounter (HOSPITAL_COMMUNITY): Payer: Self-pay

## 2019-06-24 ENCOUNTER — Other Ambulatory Visit: Payer: Self-pay

## 2019-06-24 DIAGNOSIS — M25551 Pain in right hip: Secondary | ICD-10-CM

## 2019-06-24 DIAGNOSIS — M25552 Pain in left hip: Secondary | ICD-10-CM

## 2019-06-24 DIAGNOSIS — M542 Cervicalgia: Secondary | ICD-10-CM | POA: Diagnosis not present

## 2019-06-24 DIAGNOSIS — M545 Low back pain, unspecified: Secondary | ICD-10-CM

## 2019-06-24 DIAGNOSIS — R2689 Other abnormalities of gait and mobility: Secondary | ICD-10-CM | POA: Diagnosis not present

## 2019-06-24 DIAGNOSIS — M6281 Muscle weakness (generalized): Secondary | ICD-10-CM

## 2019-06-24 NOTE — Patient Instructions (Signed)
Knee to Chest (Flexion)    Pull knee toward chest. Feel stretch in lower back or buttock area. Breathing deeply, Hold 30 seconds. Repeat with other knee. Repeat 2 times. Do 1-2 sessions per day.  http://gt2.exer.us/226   Copyright  VHI. All rights reserved.   Lower Trunk Rotation Stretch    Keeping back flat and feet together, rotate knees to left side. Hold 10 seconds. Repeat 5 times per set. Do 1-2 sets per session.   http://orth.exer.us/123   Copyright  VHI. All rights reserved.

## 2019-06-24 NOTE — Therapy (Signed)
[place], [place], [address] Phone: [phone]   Fax:  [phone]  Physical Therapy Treatment  Patient Details  Name: [name] MRN: [id number] Date of Birth: 04-Nov-1975 Referring Provider (PT): McLean-Scocuzza, Olivia Mackie   Encounter Date: 06/24/2019  PT End of Session - 06/24/19 1721    Visit Number  3    Number of Visits  18    Date for PT Re-Evaluation  08/01/19    Authorization Type  60 visit limit    Authorization Time Period  06/20/19-08/01/19    Authorization - Visit Number  3    Authorization - Number of Visits  60    PT Start Time  [id number]    PT Stop Time  [id number]    PT Time Calculation (min)  57 min    Activity Tolerance  Patient limited by pain;Patient tolerated treatment well    Behavior During Therapy  [facility] for tasks assessed/performed       Past Medical History:  Diagnosis Date  . Abdominal pain, epigastric 12/17/2015  . Anxiety    pt denies having dx of anxiety  . BCC (basal cell carcinoma of skin)    right foot   . Bunion, right foot   . Chronic cough   . Closed fibular fracture 10/2008   Left - Sustained 2/2 fall down stairs (same fall as tibular fracture). S/P internal fixation.  . Duodenitis    EGD (07/14/2011) showing chronic duodenitis, H.pyloir neg  . Elevated liver enzymes   . Endometriosis    S/P TAH and salpingo-oophorectomy, right  . Epigastric pain    Chronic, thought 2/2 gastritis, EGD (07/14/2011) showing chronic duodenitis, H.pyloir neg // Repeat EGD (09/2011) - esophagealring, sessible polyp in stomach body, hiatal hernia - rec ad probiotic, await bx  . Esophageal ring    s/p dilatation during EGD (07/2011) - Dr. Oneida Alar  . Fatty liver   . Gastritis    Thought due to chronic NSAID use 2/2 pain from her congenital hip abnormality  . Headache(784.0)    Migraines  . Hidradenitis suppurativa 05/2006  . Hip deformity, congenital    Protrusio acetabuli with articular  sclerosis and flattening of femoral heads. With chronic OA of hip  . History of migraine headaches    Typical symptoms - bright lights, unilateral (starting behind her left ear), throbbing .  Marland Kitchen Hot flashes   . Hypertension   . Internal hemorrhoids   . Osteoarthritis of hip   . Ovarian cancer ([place]) 1995  . Protrusio acetabuli    diagnosed at age 15  . Sessile colonic polyp    noted 09/2011 -- > rec colonoscopy in 10 years, 2023  . Surgical menopause 08/2007   On HRT. Occuring since 01/09 following TAH and R-salpingo-oophorectomy   . Tibial fracture 10/2008   Left - Sustained 2/2 fall down stairs. Patient is S/P closed internal fixation with Smith Nephew tibial nail locked proximal and distal  . Vitamin D deficiency 11/2009   Vit D level 15 in 11/2009    Past Surgical History:  Procedure Laterality Date  . ABDOMINAL HYSTERECTOMY    . CESAREAN SECTION  08/2005   Primary low transverse  . COLONOSCOPY  09/22/2011   [id number] polyp in the rectum/Internal hemorrhoids, benign path  . ESOPHAGOGASTRODUODENOSCOPY  09/22/2011   [id number], esophageal/Sessile polyp in the body of the stomach/Hiatal hernia/ABDOMINAL PAIN LIKELY FUNCTIONAL V. POST-INFECTIOUS IBS, path: mild chronic gastritis  . FRACTURE SURGERY  Right hip  . JOINT REPLACEMENT     right hip  . LAPAROSCOPY  10/2000   Operative laparoscopy with lysis of right adnexal adhesions and uterointestinal adhesions  . left tib fib    . OTHER SURGICAL HISTORY  02/2009   Closed treatment internal fixation, left tibia with Tamala Julian and Nephew tibial nail locked proximal and distal  . TOTAL ABDOMINAL HYSTERECTOMY W/ BILATERAL SALPINGOOPHORECTOMY  08/2007   for endometriosis. With concurrent right salpingo-oophorectomy  (2009), left oophorectomy (1995)  . TUBAL LIGATION  08/2005   Right tubal ligation    There were no vitals filed for this visit.  Subjective Assessment - 06/24/19 1702    Subjective  Pt arrived with RW, reports ease  walking.  Current pain scale range from 6-8 for neck, lumbar and hip pain.    Pertinent History  Bilateral THA    Patient Stated Goals  Get back to normal    Currently in Pain?  Yes    Pain Score  7     Pain Location  Neck   Neck, back and hip pain   Pain Orientation  Left;Posterior   Lt and posterior   Pain Descriptors / Indicators  Aching;Sore    Pain Type  Acute pain    Pain Onset  1 to 4 weeks ago    Pain Frequency  Constant    Aggravating Factors   Lying flat, prolonged sitting    Pain Relieving Factors  laying on rolling pin/foam roll    Effect of Pain on Daily Activities  limits    Multiple Pain Sites  Yes    Pain Score  7    Pain Location  Back    Pain Orientation  Lower;Left   Lt on spinal cord   Pain Descriptors / Indicators  Aching;Sore;Sharp    Pain Type  Acute pain    Pain Radiating Towards  Lt hip    Pain Onset  1 to 4 weeks ago    Pain Frequency  Intermittent    Aggravating Factors   Laying flat    Pain Relieving Factors  laying on rolling pin    Effect of Pain on Daily Activities  limiting                       [facility] Adult PT Treatment/Exercise - 06/24/19 0001      Bed Mobility   Bed Mobility  Right Sidelying to Sit;Sit to Sidelying Right    Right Sidelying to Sit  Supervision/Verbal cueing   instructed log rolling   Sit to Sidelying Right  Supervision/Verbal cueing   instructed log rolling     Ambulation/Gait   Ambulation/Gait  Yes    Ambulation/Gait Assistance  5: Supervision    Ambulation Distance (Feet)  85 Feet    Assistive device  Rolling walker    Gait Pattern  Step-to pattern;Decreased stride length;Decreased hip/knee flexion - left;Decreased dorsiflexion - left;Trunk flexed;Poor foot clearance - left;Poor foot clearance - right;Antalgic    Gait Comments  Cueing for heel to toe, equal stride length, posture.  RW adjusted to proper height      Exercises   Exercises  Neck;Lumbar      Neck Exercises: Seated   Shoulder Rolls   Backwards;10 reps    Shoulder Rolls Limitations  up, back and down    Other Seated Exercise  3D cervical excursion, slow and pain free range 10x each    Other Seated Exercise  scapular retraciton  Neck Exercises: Supine   Other Supine Exercise  Decompression exercise 1-5 5reps 3" holds      Lumbar Exercises: Stretches   Single Knee to Chest Stretch  2 reps;20 seconds    Single Knee to Chest Stretch Limitations  towel assistance      Manual Therapy   Manual Therapy  Soft tissue mobilization    Manual therapy comments  Manual complete separate than rest of tx    Soft tissue mobilization  Supine: Upper trap, paraspinals and general cervical mm               PT Short Term Goals - 06/20/19 1809      PT SHORT TERM GOAL #1   Title  Patient will be independant with initial HEP to improve functional outcomes    Time  3    Period  Weeks    Status  New    Target Date  07/11/19      PT SHORT TERM GOAL #2   Title  Patient will report less than 7/10 pain globally, on average, at rest, to improve daily function    Time  3    Period  Weeks    Status  New    Target Date  07/11/19        PT Long Term Goals - 06/20/19 1811      PT LONG TERM GOAL #1   Title  Patient will acheive >150 feet using LRAD with 2MWT in order to improve functional mobility in community.    Baseline  60 feet    Time  6    Period  Weeks    Status  New    Target Date  08/01/19      PT LONG TERM GOAL #2   Title  Patient will perform 5x STS in <30 seconds to improve functional mobility transfers.    Baseline  STS x 1: 16 seconds    Time  6    Period  Weeks    Status  New    Target Date  08/01/19      PT LONG TERM GOAL #3   Title  Patient will be able to perform staggered stance with both feet >20 seconds in oreder to improve stability with functional mobility and reduce risk of falls.    Baseline  unable    Time  6    Period  Weeks    Status  New    Target Date  08/01/19      PT LONG TERM  GOAL #4   Title  Patient will improve cervical rotation by 10 degrees in each direction in order to scan environment and improve safetry awareness with funcitonal activity and ADLs.    Time  6    Period  Weeks    Status  New    Target Date  08/01/19            Plan - 06/24/19 1810    Clinical Impression Statement  Pt arrived with RW, adjusted to proper height.  Gait training to improve mechanics, pt able to follow cues appropriate, very slow cadence.  Began decompression exercises for postural strengthening and to improve tolerance with supine position.  Pt educated on log rolling to reduce pain wiht transition sitting to supine position.  Added SKTC to improve lumbar mobility.  Also educated on proper mechanics with sit to stand (lean forward, nose over toes) instead of standing upright with reports of relief with task.  EOS with  manual soft tissue mobilization to address restricitons on cervical mucultuare, pain scale 7/10 for neck and back.  Encouraged hydration following manual to reduce risk of headache following session.    Personal Factors and Comorbidities  Comorbidity 2    Comorbidities  Globalized pain in multiple sites    Examination-Activity Limitations  Bed Mobility;Sleep;Bend;Squat;Bathing;Sit;Carry;Stand;Dressing;Transfers;Lift;Locomotion Level;Stairs    Examination-Participation Restrictions  Cleaning;Community Activity;Dorita Sciara    Stability/Clinical Decision Making  Evolving/Moderate complexity    Clinical Decision Making  Moderate    Rehab Potential  Fair    PT Frequency  3x / week    PT Duration  6 weeks    PT Treatment/Interventions  ADLs/Self Care Home Management;DME Instruction;Biofeedback;Gait training;Stair training;Cryotherapy;Electrical Stimulation;Functional mobility training;Therapeutic activities;Moist Heat;Therapeutic exercise;Balance training;Manual techniques;Taping;Wheelchair mobility training;Orthotic Fit/Training;Patient/family education;Energy  conservation;Joint Manipulations;Spinal Manipulations;Passive range of motion;Neuromuscular re-education    PT Next Visit Plan  Add LTR next session.  Gait training wiht RW.  Continue mobility exercises (3D cervical excursions, LTR, shoulder roll), postural strengthening.  Add and progress standing LBE strengthening as tolerated.    PT Home Exercise Plan  06/22/19: 3D cervical excursion, shoulder rolls and scapular retraction. 11/13: SKTC, decompression exercise.       Patient will benefit from skilled therapeutic intervention in order to improve the following deficits and impairments:  Abnormal gait, Decreased endurance, Decreased strength, Decreased activity tolerance, Decreased balance, Decreased mobility, Difficulty walking, Pain, Decreased range of motion, Impaired flexibility, Improper body mechanics, Postural dysfunction  Visit Diagnosis: Cervicalgia  Acute left-sided low back pain without sciatica  Pain in right hip  Pain in left hip  Other abnormalities of gait and mobility  Muscle weakness (generalized)     Problem List Patient Active Problem List   Diagnosis Date Noted  . Concussion with no loss of consciousness 08/12/2018  . Acute pain of left shoulder 08/12/2018  . Low back pain 08/12/2018  . Allergic rhinitis 08/05/2018  . Arthritis 07/01/2018  . DDD (degenerative disc disease), lumbar 05/21/2018  . Thoracic arthritis 05/21/2018  . DDD (degenerative disc disease), cervical 01/06/2018  . Hidradenitis suppurativa 10/11/2017  . Chronic frontal sinusitis 10/11/2017  . Fatty liver 10/11/2017  . Chronic ethmoidal sinusitis 09/13/2017  . Photophobia of both eyes 09/13/2017  . Abnormal liver enzymes 09/13/2017  . History of hysterectomy with bilateral oophorectomy 06/24/2017  . Estrogen deficiency 06/24/2017  . Overweight 11/17/2016  . Abdominal pain 11/04/2016  . Headache 12/17/2015  . Chronic constipation 10/12/2012  . Hot flashes 02/20/2012  . Hiatal hernia  02/20/2012  . Chronic pain of both hips 02/20/2012  . Fatigue 12/19/2011  . Acetabulum intrapelvic protrusion into pelvic region or thigh 05/15/2011  . Annual physical exam 10/03/2010  . Hypertension 03/20/2010  . Vitamin D deficiency 08/20/2009  . Anxiety 05/18/2009  . Migraine headache 05/13/2006   Ihor Austin, [place]; [place]  Aldona Lento 06/24/2019, 6:21 PM  [place], [place], [address] Phone: [phone]   Fax:  [phone]  Name: MALILLANY GRAWE MRN: [id number] Date of Birth: 08-Nov-1975

## 2019-06-27 ENCOUNTER — Ambulatory Visit (HOSPITAL_COMMUNITY): Payer: BC Managed Care – PPO | Admitting: Physical Therapy

## 2019-06-27 ENCOUNTER — Telehealth (HOSPITAL_COMMUNITY): Payer: Self-pay | Admitting: Physical Therapy

## 2019-06-27 NOTE — Telephone Encounter (Signed)
pt called to cancel due to pt's granddtr has a game today that she does not want to miss.

## 2019-06-29 ENCOUNTER — Encounter (HOSPITAL_COMMUNITY): Payer: BC Managed Care – PPO | Admitting: Physical Therapy

## 2019-06-29 ENCOUNTER — Telehealth (HOSPITAL_COMMUNITY): Payer: Self-pay | Admitting: Physical Therapy

## 2019-06-29 NOTE — Telephone Encounter (Signed)
pt called to cancel the appts week of 11/24 and 11/25 due to she will be out of town

## 2019-06-29 NOTE — Telephone Encounter (Signed)
pt called to cancel today's appt due to no transportation 

## 2019-07-01 ENCOUNTER — Ambulatory Visit (HOSPITAL_COMMUNITY): Payer: BC Managed Care – PPO | Admitting: Physical Therapy

## 2019-07-05 ENCOUNTER — Encounter (HOSPITAL_COMMUNITY): Payer: BC Managed Care – PPO | Admitting: Physical Therapy

## 2019-07-06 ENCOUNTER — Encounter (HOSPITAL_COMMUNITY): Payer: BC Managed Care – PPO | Admitting: Physical Therapy

## 2019-07-11 ENCOUNTER — Other Ambulatory Visit: Payer: Self-pay

## 2019-07-11 ENCOUNTER — Ambulatory Visit (HOSPITAL_COMMUNITY): Payer: BC Managed Care – PPO | Admitting: Physical Therapy

## 2019-07-11 ENCOUNTER — Encounter (HOSPITAL_COMMUNITY): Payer: Self-pay | Admitting: Physical Therapy

## 2019-07-11 DIAGNOSIS — M545 Low back pain, unspecified: Secondary | ICD-10-CM

## 2019-07-11 DIAGNOSIS — M542 Cervicalgia: Secondary | ICD-10-CM

## 2019-07-11 DIAGNOSIS — M25551 Pain in right hip: Secondary | ICD-10-CM

## 2019-07-11 DIAGNOSIS — M6281 Muscle weakness (generalized): Secondary | ICD-10-CM

## 2019-07-11 DIAGNOSIS — M25552 Pain in left hip: Secondary | ICD-10-CM | POA: Diagnosis not present

## 2019-07-11 DIAGNOSIS — R2689 Other abnormalities of gait and mobility: Secondary | ICD-10-CM | POA: Diagnosis not present

## 2019-07-11 NOTE — Therapy (Signed)
[facility] [address] [place], [place], [address] Phone: [phone]   Fax:  [phone]  Physical Therapy Treatment/ Progress Note  Patient Details  Name: [name] MRN: [id number] Date of Birth: June 20, 1976 Referring Provider (PT): McLean-Scocuzza, Olivia Mackie   Encounter Date: 07/11/2019   Progress Note Reporting Period 06/20/19 to 07/11/19  See note below for Objective Data and Assessment of Progress/Goals.     PT End of Session - 07/11/19 1746    Visit Number  4    Number of Visits  18    Date for PT Re-Evaluation  08/01/19    Authorization Type  60 visit limit    Authorization Time Period  06/20/19-08/01/19    Authorization - Visit Number  4    Authorization - Number of Visits  60    PT Start Time  [id number]    PT Stop Time  1820    PT Time Calculation (min)  40 min    Activity Tolerance  Patient limited by pain;Patient tolerated treatment well    Behavior During Therapy  [facility] for tasks assessed/performed       Past Medical History:  Diagnosis Date  . Abdominal pain, epigastric 12/17/2015  . Anxiety    pt denies having dx of anxiety  . BCC (basal cell carcinoma of skin)    right foot   . Bunion, right foot   . Chronic cough   . Closed fibular fracture 10/2008   Left - Sustained 2/2 fall down stairs (same fall as tibular fracture). S/P internal fixation.  . Duodenitis    EGD (07/14/2011) showing chronic duodenitis, H.pyloir neg  . Elevated liver enzymes   . Endometriosis    S/P TAH and salpingo-oophorectomy, right  . Epigastric pain    Chronic, thought 2/2 gastritis, EGD (07/14/2011) showing chronic duodenitis, H.pyloir neg // Repeat EGD (09/2011) - esophagealring, sessible polyp in stomach body, hiatal hernia - rec ad probiotic, await bx  . Esophageal ring    s/p dilatation during EGD (07/2011) - Dr. Oneida Alar  . Fatty liver   . Gastritis    Thought due to chronic NSAID use 2/2 pain from her congenital hip  abnormality  . Headache(784.0)    Migraines  . Hidradenitis suppurativa 05/2006  . 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 With chronic OA of hip  . History of migraine headaches    Typical symptoms - bright lights, unilateral (starting behind her left ear), throbbing .  Marland Kitchen Hot flashes   . Hypertension   . Internal hemorrhoids   . Osteoarthritis of hip   . Ovarian cancer ([place]) 1995  . Protrusio acetabuli    diagnosed at age 74  . Sessile colonic polyp    noted 09/2011 -- > rec colonoscopy in 10 years, 2023  . Surgical menopause 08/2007   On HRT. Occuring since 01/09 following TAH and R-salpingo-oophorectomy   . Tibial fracture 10/2008   Left - Sustained 2/2 fall down stairs. Patient is S/P closed internal fixation with Smith Nephew tibial nail locked proximal and distal  . Vitamin D deficiency 11/2009   Vit D level 15 in 11/2009    Past Surgical History:  Procedure Laterality Date  . ABDOMINAL HYSTERECTOMY    . CESAREAN SECTION  08/2005   Primary low transverse  . COLONOSCOPY  09/22/2011   [id number] polyp in the rectum/Internal hemorrhoids, benign path  . ESOPHAGOGASTRODUODENOSCOPY  09/22/2011   [id number], esophageal/Sessile  polyp in the body of the stomach/Hiatal hernia/ABDOMINAL PAIN LIKELY FUNCTIONAL V. POST-INFECTIOUS IBS, path: mild chronic gastritis  . FRACTURE SURGERY     Right hip  . JOINT REPLACEMENT     right hip  . LAPAROSCOPY  10/2000   Operative laparoscopy with lysis of right adnexal adhesions and uterointestinal adhesions  . left tib fib    . OTHER SURGICAL HISTORY  02/2009   Closed treatment internal fixation, left tibia with Tamala Julian and Nephew tibial nail locked proximal and distal  . TOTAL ABDOMINAL HYSTERECTOMY W/ BILATERAL SALPINGOOPHORECTOMY  08/2007   for endometriosis. With concurrent right salpingo-oophorectomy  (2009), left oophorectomy (1995)  . TUBAL LIGATION  08/2005   Right tubal  ligation    There were no vitals filed for this visit.  Subjective Assessment - 07/11/19 1741    Subjective  Patient says her pain is a little bit better. Patient says she has been doing HEP for neck which she says is helpful.    Pertinent History  Bilateral THA    Limitations  Sitting;Lifting;Standing;Walking;House hold activities    How long can you sit comfortably?  30 minutes    How long can you stand comfortably?  5-10 minutes    How long can you walk comfortably?  5 minutes    Diagnostic tests  CT scan, xrays    Patient Stated Goals  Get back to normal    Currently in Pain?  Yes    Pain Score  8     Pain Location  Neck    Pain Orientation  Left;Posterior    Pain Descriptors / Indicators  Sharp    Pain Type  Acute pain    Pain Onset  More than a month ago    Pain Frequency  Constant    Aggravating Factors   lying flat, sitting    Pain Relieving Factors  message    Effect of Pain on Daily Activities  Limits    Multiple Pain Sites  Yes    Pain Score  9    Pain Location  Back    Pain Orientation  Right;Lower    Pain Descriptors / Indicators  Aching;Dull    Pain Type  Acute pain    Pain Onset  More than a month ago    Pain Frequency  Constant    Aggravating Factors   laying flat    Pain Relieving Factors  message, rest    Effect of Pain on Daily Activities  limits         [facility] PT Assessment - 07/11/19 0001      Assessment   Medical Diagnosis  Neck, lumbar, Bilateral hip pain     Referring Provider (PT)  McLean-Scocuzza, Olivia Mackie    Onset Date/Surgical Date  05/25/19    Prior Therapy  Denies prior therapy       Precautions   Precautions  Fall      Restrictions   Weight Bearing Restrictions  No      Balance Screen   Has the patient fallen in the past 6 months  No    Has the patient had a decrease in activity level because of a fear of falling?   Yes    Is the patient reluctant to leave their home because of a fear of falling?   Yes      [place] residence      Prior Function   Level of Independence  Independent with  basic ADLs      Cognition   Overall Cognitive Status  Within Functional Limits for tasks assessed      AROM   Cervical Flexion  22    Cervical Extension  8    Cervical - Right Side Bend  14    Cervical - Left Side Bend  18    Cervical - Right Rotation  35    Cervical - Left Rotation  26    Lumbar Flexion  80%   limited   Lumbar Extension  100%   limited     Strength   Right Hip Flexion  3+/5   was 3-   Right Hip ABduction  4-/5    Right Hip ADduction  4-/5    Left Hip Flexion  3-/5   was 2+   Left Hip ABduction  4-/5   was 3+   Left Hip ADduction  4-/5   was 3+   Right Knee Flexion  4-/5    Right Knee Extension  4-/5    Left Knee Flexion  4-/5    Left Knee Extension  3/5   was 3-   Right Ankle Dorsiflexion  4/5   was 4-   Left Ankle Dorsiflexion  4-/5      Transfers   Five time sit to stand comments   83 seconds with BUE assist       Ambulation/Gait   Ambulation/Gait  Yes    Ambulation/Gait Assistance  5: Supervision    Ambulation Distance (Feet)  105 Feet    Assistive device  Rolling walker    Gait Pattern  Step-to pattern;Decreased stride length;Decreased hip/knee flexion - left;Decreased dorsiflexion - left;Trunk flexed;Poor foot clearance - left;Poor foot clearance - right;Antalgic    Ambulation Surface  Level    Gait Comments  2MWT                   [facility] Adult PT Treatment/Exercise - 07/11/19 0001      Manual Therapy   Manual Therapy  Soft tissue mobilization    Manual therapy comments  Manual complete separate than rest of tx    Soft tissue mobilization  IASTM to bilateral lumbar paraspinals in seated position                PT Short Term Goals - 07/11/19 1831      PT SHORT TERM GOAL #1   Title  Patient will be independant with initial HEP to improve functional outcomes    Time  3    Period  Weeks    Status  Achieved     Target Date  07/11/19      PT SHORT TERM GOAL #2   Title  Patient will report less than 7/10 pain globally, on average, at rest, to improve daily function    Time  3    Period  Weeks    Status  On-going    Target Date  08/01/19        PT Long Term Goals - 07/11/19 1832      PT LONG TERM GOAL #1   Title  Patient will acheive >150 feet using LRAD with 2MWT in order to improve functional mobility in community.    Baseline  60 feet; current: 105 feet    Time  6    Period  Weeks    Status  On-going    Target Date  08/01/19      PT LONG TERM GOAL #2  Title  Patient will perform 5x STS in <30 seconds to improve functional mobility transfers.    Baseline  STS x 1: 16 seconds; current: 83 seconds with use of both arms    Time  6    Period  Weeks    Status  On-going    Target Date  08/01/19      PT LONG TERM GOAL #3   Title  Patient will be able to perform staggered stance with both feet >20 seconds in oreder to improve stability with functional mobility and reduce risk of falls.    Baseline  unable    Time  6    Period  Weeks    Status  On-going    Target Date  08/01/19      PT LONG TERM GOAL #4   Title  Patient will improve cervical rotation by 10 degrees in each direction in order to scan environment and improve safetry awareness with funcitonal activity and ADLs.    Baseline  37 deg RT; 22 deg LT    Time  6    Period  Weeks    Status  On-going    Target Date  08/01/19            Plan - 07/11/19 1827    Clinical Impression Statement  Patient is slowly progressing to LTGs. Patient has attended only 3 therapy sessions prior to today, largely due to scheduling conflicts. Patient does show some improvement in cervical ROM, BLE strength and functional mobility, but is still significantly limited in all aspects, and continues to have high level pain with most activity. Focused todays session on lumbar pain due to high subjective pain report, using IASTM. Patient did note  reduction of pain from 9/10 at rest to 7/10. At this time, patient will likely continue to benefit from skilled therapy services to address remaining deficits and decrease pain to improve level of function with ADLs, functional mobility and reduce risk for future falls.    Personal Factors and Comorbidities  Comorbidity 2    Comorbidities  Globalized pain in multiple sites    Examination-Activity Limitations  Bed Mobility;Sleep;Bend;Squat;Bathing;Sit;Carry;Stand;Dressing;Transfers;Lift;Locomotion Level;Stairs    Examination-Participation Restrictions  Cleaning;Community Activity;[employer]    Stability/Clinical Decision Making  Evolving/Moderate complexity    Rehab Potential  Fair    PT Frequency  3x / week    PT Duration  3 weeks    PT Treatment/Interventions  ADLs/Self Care Home Management;DME Instruction;Biofeedback;Gait training;Stair training;Cryotherapy;Electrical Stimulation;Functional mobility training;Therapeutic activities;Moist Heat;Therapeutic exercise;Balance training;Manual techniques;Taping;Wheelchair mobility training;Orthotic Fit/Training;Patient/family education;Energy conservation;Joint Manipulations;Spinal Manipulations;Passive range of motion;Neuromuscular re-education    PT Next Visit Plan  Add LTR next session.  Gait training wiht RW.  Continue mobility exercises (3D cervical excursions, LTR, shoulder roll), postural strengthening.  Add and progress standing LBE strengthening as tolerated.    PT Home Exercise Plan  06/22/19: 3D cervical excursion, shoulder rolls and scapular retraction. 11/13: SKTC, decompression exercise.    Consulted and Agree with Plan of Care  Patient       Patient will benefit from skilled therapeutic intervention in order to improve the following deficits and impairments:  Abnormal gait, Decreased endurance, Decreased strength, Decreased activity tolerance, Decreased balance, Decreased mobility, Difficulty walking, Pain, Decreased range of motion,  Impaired flexibility, Improper body mechanics, Postural dysfunction  Visit Diagnosis: Cervicalgia  Acute left-sided low back pain without sciatica  Pain in right hip  Pain in left hip  Other abnormalities of gait and mobility  Muscle weakness (generalized)  Problem List Patient Active Problem List   Diagnosis Date Noted  . Concussion with no loss of consciousness 08/12/2018  . Acute pain of left shoulder 08/12/2018  . Low back pain 08/12/2018  . Allergic rhinitis 08/05/2018  . Arthritis 07/01/2018  . DDD (degenerative disc disease), lumbar 05/21/2018  . Thoracic arthritis 05/21/2018  . DDD (degenerative disc disease), cervical 01/06/2018  . Hidradenitis suppurativa 10/11/2017  . Chronic frontal sinusitis 10/11/2017  . Fatty liver 10/11/2017  . Chronic ethmoidal sinusitis 09/13/2017  . Photophobia of both eyes 09/13/2017  . Abnormal liver enzymes 09/13/2017  . History of hysterectomy with bilateral oophorectomy 06/24/2017  . Estrogen deficiency 06/24/2017  . Overweight 11/17/2016  . Abdominal pain 11/04/2016  . Headache 12/17/2015  . Chronic constipation 10/12/2012  . Hot flashes 02/20/2012  . Hiatal hernia 02/20/2012  . Chronic pain of both hips 02/20/2012  . Fatigue 12/19/2011  . Acetabulum intrapelvic protrusion into pelvic region or thigh 05/15/2011  . Annual physical exam 10/03/2010  . Hypertension 03/20/2010  . Vitamin D deficiency 08/20/2009  . Anxiety 05/18/2009  . Migraine headache 05/13/2006   6:38 PM, 07/11/19 Josue Hector PT DPT  Physical Therapist with [facility]  [phone] [place] [address] [place], [place], [address] Phone: [phone]   Fax:  [phone]  Name: TANNIE EICHMANN MRN: [id number] Date of Birth: [birth year]

## 2019-07-12 DIAGNOSIS — K5909 Other constipation: Secondary | ICD-10-CM | POA: Diagnosis not present

## 2019-07-12 DIAGNOSIS — Z23 Encounter for immunization: Secondary | ICD-10-CM | POA: Diagnosis not present

## 2019-07-12 DIAGNOSIS — K760 Fatty (change of) liver, not elsewhere classified: Secondary | ICD-10-CM | POA: Diagnosis not present

## 2019-07-12 DIAGNOSIS — K581 Irritable bowel syndrome with constipation: Secondary | ICD-10-CM | POA: Diagnosis not present

## 2019-07-13 ENCOUNTER — Other Ambulatory Visit: Payer: Self-pay

## 2019-07-13 ENCOUNTER — Encounter (HOSPITAL_COMMUNITY): Payer: Self-pay

## 2019-07-13 ENCOUNTER — Ambulatory Visit (HOSPITAL_COMMUNITY): Payer: BC Managed Care – PPO | Attending: Internal Medicine

## 2019-07-13 DIAGNOSIS — M6281 Muscle weakness (generalized): Secondary | ICD-10-CM | POA: Diagnosis not present

## 2019-07-13 DIAGNOSIS — M542 Cervicalgia: Secondary | ICD-10-CM | POA: Insufficient documentation

## 2019-07-13 DIAGNOSIS — M25551 Pain in right hip: Secondary | ICD-10-CM | POA: Insufficient documentation

## 2019-07-13 DIAGNOSIS — M25552 Pain in left hip: Secondary | ICD-10-CM | POA: Diagnosis not present

## 2019-07-13 DIAGNOSIS — M545 Low back pain, unspecified: Secondary | ICD-10-CM

## 2019-07-13 DIAGNOSIS — R2689 Other abnormalities of gait and mobility: Secondary | ICD-10-CM | POA: Diagnosis not present

## 2019-07-13 NOTE — Therapy (Signed)
[name], [place], [address] Phone: [phone]   Fax:  [phone]  Physical Therapy Treatment  Patient Details  Name: [name] MRN: [id number] Date of Birth: Jul 17, 1976 Referring Provider (PT): McLean-Scocuzza, Olivia Mackie   Encounter Date: 07/13/2019  PT End of Session - 07/13/19 0926    Visit Number  5    Number of Visits  18    Date for PT Re-Evaluation  08/01/19   PR complete 07/11/19   Authorization Type  60 visit limit    Authorization Time Period  06/20/19-08/01/19; progress note complete 11/30 visit #4    Authorization - Visit Number  5    Authorization - Number of Visits  60    PT Start Time  0920    PT Stop Time  1000    PT Time Calculation (min)  40 min    Activity Tolerance  Patient limited by pain;Patient tolerated treatment well   Pain scale 7/10 at EOS   Behavior During Therapy  [facility] for tasks assessed/performed       Past Medical History:  Diagnosis Date  . Abdominal pain, epigastric 12/17/2015  . Anxiety    pt denies having dx of anxiety  . BCC (basal cell carcinoma of skin)    right foot   . Bunion, right foot   . Chronic cough   . Closed fibular fracture 10/2008   Left - Sustained 2/2 fall down stairs (same fall as tibular fracture). S/P internal fixation.  . Duodenitis    EGD (07/14/2011) showing chronic duodenitis, H.pyloir neg  . Elevated liver enzymes   . Endometriosis    S/P TAH and salpingo-oophorectomy, right  . Epigastric pain    Chronic, thought 2/2 gastritis, EGD (07/14/2011) showing chronic duodenitis, H.pyloir neg // Repeat EGD (09/2011) - esophagealring, sessible polyp in stomach body, hiatal hernia - rec ad probiotic, await bx  . Esophageal ring    s/p dilatation during EGD (07/2011) - Dr. Oneida Alar  . Fatty liver   . Gastritis    Thought due to chronic NSAID use 2/2 pain from her congenital hip abnormality  . Headache(784.0)    Migraines  . Hidradenitis  suppurativa 05/2006  . 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 With chronic OA of hip  . History of migraine headaches    Typical symptoms - bright lights, unilateral (starting behind her left ear), throbbing .  Marland Kitchen Hot flashes   . Hypertension   . Internal hemorrhoids   . Osteoarthritis of hip   . Ovarian cancer ([place]) 1995  . Protrusio acetabuli    diagnosed at age 51  . Sessile colonic polyp    noted 09/2011 -- > rec colonoscopy in 10 years, 2023  . Surgical menopause 08/2007   On HRT. Occuring since 01/09 following TAH and R-salpingo-oophorectomy   . Tibial fracture 10/2008   Left - Sustained 2/2 fall down stairs. Patient is S/P closed internal fixation with Smith Nephew tibial nail locked proximal and distal  . Vitamin D deficiency 11/2009   Vit D level 15 in 11/2009    Past Surgical History:  Procedure Laterality Date  . ABDOMINAL HYSTERECTOMY    . CESAREAN SECTION  08/2005   Primary low transverse  . COLONOSCOPY  09/22/2011   [id number] polyp in the rectum/Internal hemorrhoids, benign path  . ESOPHAGOGASTRODUODENOSCOPY  09/22/2011   [id number], esophageal/Sessile polyp in the body of the stomach/Hiatal hernia/ABDOMINAL PAIN  LIKELY FUNCTIONAL V. POST-INFECTIOUS IBS, path: mild chronic gastritis  . FRACTURE SURGERY     Right hip  . JOINT REPLACEMENT     right hip  . LAPAROSCOPY  10/2000   Operative laparoscopy with lysis of right adnexal adhesions and uterointestinal adhesions  . left tib fib    . OTHER SURGICAL HISTORY  02/2009   Closed treatment internal fixation, left tibia with Tamala Julian and Nephew tibial nail locked proximal and distal  . TOTAL ABDOMINAL HYSTERECTOMY W/ BILATERAL SALPINGOOPHORECTOMY  08/2007   for endometriosis. With concurrent right salpingo-oophorectomy  (2009), left oophorectomy (1995)  . TUBAL LIGATION  08/2005   Right tubal ligation    There were no vitals filed for this  visit.  Subjective Assessment - 07/13/19 0922    Subjective  Pt stated she has a dull ache, current pain scale 7-8/10 for neck, back and Lt hip.    Pertinent History  Bilateral THA    Patient Stated Goals  Get back to normal    Currently in Pain?  Yes    Pain Score  8     Pain Location  Back   neck, back and Lt hip   Pain Orientation  Lower;Posterior;Left    Pain Descriptors / Indicators  Aching;Dull;Sharp    Pain Type  Acute pain    Pain Onset  More than a month ago    Pain Frequency  Constant    Aggravating Factors   lying flat, sitting    Pain Relieving Factors  massage    Effect of Pain on Daily Activities  limits                       [facility] Adult PT Treatment/Exercise - 07/13/19 0001      Exercises   Exercises  Neck;Lumbar      Neck Exercises: Seated   Other Seated Exercise  3D cervical excursion, slow and pain free range 5x each    Other Seated Exercise  scapular retraciton      Lumbar Exercises: Stretches   Single Knee to Chest Stretch  2 reps;20 seconds    Single Knee to Chest Stretch Limitations  towel assistance    Lower Trunk Rotation  5 reps;10 seconds    Prone on Elbows Stretch  1 rep;60 seconds    Other Lumbar Stretch Exercise  prone x 2 minutes      Lumbar Exercises: Supine   Bridge  5 reps;3 seconds    Bridge Limitations  2 sets      Manual Therapy   Manual Therapy  Soft tissue mobilization    Manual therapy comments  Manual complete separate than rest of tx    Soft tissue mobilization  prone: lumbar paraspinals and QL               PT Short Term Goals - 07/11/19 1831      PT SHORT TERM GOAL #1   Title  Patient will be independant with initial HEP to improve functional outcomes    Time  3    Period  Weeks    Status  Achieved    Target Date  07/11/19      PT SHORT TERM GOAL #2   Title  Patient will report less than 7/10 pain globally, on average, at rest, to improve daily function    Time  3    Period  Weeks    Status   On-going    Target Date  08/01/19  PT Long Term Goals - 07/11/19 1832      PT LONG TERM GOAL #1   Title  Patient will acheive >150 feet using LRAD with 2MWT in order to improve functional mobility in community.    Baseline  60 feet; current: 105 feet    Time  6    Period  Weeks    Status  On-going    Target Date  08/01/19      PT LONG TERM GOAL #2   Title  Patient will perform 5x STS in <30 seconds to improve functional mobility transfers.    Baseline  STS x 1: 16 seconds; current: 83 seconds with use of both arms    Time  6    Period  Weeks    Status  On-going    Target Date  08/01/19      PT LONG TERM GOAL #3   Title  Patient will be able to perform staggered stance with both feet >20 seconds in oreder to improve stability with functional mobility and reduce risk of falls.    Baseline  unable    Time  6    Period  Weeks    Status  On-going    Target Date  08/01/19      PT LONG TERM GOAL #4   Title  Patient will improve cervical rotation by 10 degrees in each direction in order to scan environment and improve safetry awareness with funcitonal activity and ADLs.    Baseline  37 deg RT; 22 deg LT    Time  6    Period  Weeks    Status  On-going    Target Date  08/01/19            Plan - 07/13/19 1007    Clinical Impression Statement  Added LTR for lumbar mobility and prone activities for lumbar extension wiht positive results, added prone and POE to HEP.  Began bridges as well for extension and gluteal strengthening, pt unable to acheive full extension for pain control.  EOS with manual STM to address lumbar restrictions wiht reports of pain reduced to 7/10 (was 8/10 at beginning of session.)    Personal Factors and Comorbidities  Comorbidity 2    Comorbidities  Globalized pain in multiple sites    Examination-Activity Limitations  Bed Mobility;Sleep;Bend;Squat;Bathing;Sit;Carry;Stand;Dressing;Transfers;Lift;Locomotion Level;Stairs    Examination-Participation  Restrictions  Cleaning;Community Activity;Dorita Sciara    Stability/Clinical Decision Making  Evolving/Moderate complexity    Clinical Decision Making  Moderate    Rehab Potential  Fair    PT Frequency  3x / week    PT Duration  3 weeks    PT Treatment/Interventions  ADLs/Self Care Home Management;DME Instruction;Biofeedback;Gait training;Stair training;Cryotherapy;Electrical Stimulation;Functional mobility training;Therapeutic activities;Moist Heat;Therapeutic exercise;Balance training;Manual techniques;Taping;Wheelchair mobility training;Orthotic Fit/Training;Patient/family education;Energy conservation;Joint Manipulations;Spinal Manipulations;Passive range of motion;Neuromuscular re-education    PT Next Visit Plan  Add standing extension, prone push up and rocker board next session.  Gait training wiht RW.  Continue mobility exercises (3D cervical excursions, LTR, shoulder roll), postural strengthening.  Add and progress standing LBE strengthening as tolerated.    PT Home Exercise Plan  06/22/19: 3D cervical excursion, shoulder rolls and scapular retraction. 11/13: SKTC, decompression exercise. 12/02: prone and POE       Patient will benefit from skilled therapeutic intervention in order to improve the following deficits and impairments:  Abnormal gait, Decreased endurance, Decreased strength, Decreased activity tolerance, Decreased balance, Decreased mobility, Difficulty walking, Pain, Decreased range of motion, Impaired flexibility, Improper  body mechanics, Postural dysfunction  Visit Diagnosis: Acute left-sided low back pain without sciatica  Pain in right hip  Cervicalgia  Pain in left hip  Other abnormalities of gait and mobility  Muscle weakness (generalized)     Problem List Patient Active Problem List   Diagnosis Date Noted  . Concussion with no loss of consciousness 08/12/2018  . Acute pain of left shoulder 08/12/2018  . Low back pain 08/12/2018  . Allergic  rhinitis 08/05/2018  . Arthritis 07/01/2018  . DDD (degenerative disc disease), lumbar 05/21/2018  . Thoracic arthritis 05/21/2018  . DDD (degenerative disc disease), cervical 01/06/2018  . Hidradenitis suppurativa 10/11/2017  . Chronic frontal sinusitis 10/11/2017  . Fatty liver 10/11/2017  . Chronic ethmoidal sinusitis 09/13/2017  . Photophobia of both eyes 09/13/2017  . Abnormal liver enzymes 09/13/2017  . History of hysterectomy with bilateral oophorectomy 06/24/2017  . Estrogen deficiency 06/24/2017  . Overweight 11/17/2016  . Abdominal pain 11/04/2016  . Headache 12/17/2015  . Chronic constipation 10/12/2012  . Hot flashes 02/20/2012  . Hiatal hernia 02/20/2012  . Chronic pain of both hips 02/20/2012  . Fatigue 12/19/2011  . Acetabulum intrapelvic protrusion into pelvic region or thigh 05/15/2011  . Annual physical exam 10/03/2010  . Hypertension 03/20/2010  . Vitamin D deficiency 08/20/2009  . Anxiety 05/18/2009  . Migraine headache 05/13/2006   Ihor Austin, [place]; CBIS [phone]  Aldona Lento 07/13/2019, 10:14 AM  [place], [place], [address] Phone: [phone]   Fax:  [phone]  Name: REWA DECKELMAN MRN: [id number] Date of Birth: 1976/02/22

## 2019-07-13 NOTE — Patient Instructions (Signed)
Prone    Assist person to lie face down as needed. Place pillows as follows... Above hips (under stomach). Between hips and knees. Between knees and ankles. Under head for comfort as needed. Check that hips, knees and feet are not touching surface.  Copyright  VHI. All rights reserved.   On Elbows (Prone)    Rise up on elbows as high as possible, keeping hips on floor. Hold 2 minutes seconds. Repeat 2  times per set. Do 2 sets per day http://orth.exer.us/93   Copyright  VHI. All rights reserved.

## 2019-07-15 ENCOUNTER — Other Ambulatory Visit: Payer: Self-pay

## 2019-07-15 ENCOUNTER — Encounter (HOSPITAL_COMMUNITY): Payer: Self-pay

## 2019-07-15 ENCOUNTER — Ambulatory Visit (HOSPITAL_COMMUNITY): Payer: BC Managed Care – PPO

## 2019-07-15 DIAGNOSIS — M25552 Pain in left hip: Secondary | ICD-10-CM | POA: Diagnosis not present

## 2019-07-15 DIAGNOSIS — A499 Bacterial infection, unspecified: Secondary | ICD-10-CM | POA: Diagnosis not present

## 2019-07-15 DIAGNOSIS — N390 Urinary tract infection, site not specified: Secondary | ICD-10-CM | POA: Diagnosis not present

## 2019-07-15 DIAGNOSIS — R2689 Other abnormalities of gait and mobility: Secondary | ICD-10-CM | POA: Diagnosis not present

## 2019-07-15 DIAGNOSIS — M542 Cervicalgia: Secondary | ICD-10-CM | POA: Diagnosis not present

## 2019-07-15 DIAGNOSIS — M545 Low back pain, unspecified: Secondary | ICD-10-CM

## 2019-07-15 DIAGNOSIS — M25551 Pain in right hip: Secondary | ICD-10-CM | POA: Diagnosis not present

## 2019-07-15 DIAGNOSIS — M6281 Muscle weakness (generalized): Secondary | ICD-10-CM | POA: Diagnosis not present

## 2019-07-15 DIAGNOSIS — R300 Dysuria: Secondary | ICD-10-CM | POA: Diagnosis not present

## 2019-07-15 DIAGNOSIS — L089 Local infection of the skin and subcutaneous tissue, unspecified: Secondary | ICD-10-CM | POA: Diagnosis not present

## 2019-07-15 NOTE — Therapy (Signed)
[place], [place], [address] Phone: [phone]   Fax:  [phone]  Physical Therapy Treatment  Patient Details  Name: [name] MRN: [id number] Date of Birth: 1975-09-16 Referring Provider (PT): McLean-Scocuzza, Olivia Mackie   Encounter Date: 07/15/2019  PT End of Session - 07/15/19 1003    Visit Number  6    Number of Visits  18    Date for PT Re-Evaluation  08/01/19    Authorization Type  60 visit limit    Authorization Time Period  06/20/19-08/01/19; progress note complete 11/30 visit #4    Authorization - Visit Number  6    Authorization - Number of Visits  60    PT Start Time  0916    PT Stop Time  0957    PT Time Calculation (min)  41 min    Equipment Utilized During Treatment  Gait belt    Activity Tolerance  Patient limited by pain;Patient tolerated treatment well;No increased pain    Behavior During Therapy  WFL for tasks assessed/performed       Past Medical History:  Diagnosis Date  . Abdominal pain, epigastric 12/17/2015  . Anxiety    pt denies having dx of anxiety  . BCC (basal cell carcinoma of skin)    right foot   . Bunion, right foot   . Chronic cough   . Closed fibular fracture 10/2008   Left - Sustained 2/2 fall down stairs (same fall as tibular fracture). S/P internal fixation.  . Duodenitis    EGD (07/14/2011) showing chronic duodenitis, H.pyloir neg  . Elevated liver enzymes   . Endometriosis    S/P TAH and salpingo-oophorectomy, right  . Epigastric pain    Chronic, thought 2/2 gastritis, EGD (07/14/2011) showing chronic duodenitis, H.pyloir neg // Repeat EGD (09/2011) - esophagealring, sessible polyp in stomach body, hiatal hernia - rec ad probiotic, await bx  . Esophageal ring    s/p dilatation during EGD (07/2011) - Dr. Oneida Alar  . Fatty liver   . Gastritis    Thought due to chronic NSAID use 2/2 pain from her congenital hip abnormality  . Headache(784.0)    Migraines  .  Hidradenitis suppurativa 05/2006  . 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 With chronic OA of hip  . History of migraine headaches    Typical symptoms - bright lights, unilateral (starting behind her left ear), throbbing .  Marland Kitchen Hot flashes   . Hypertension   . Internal hemorrhoids   . Osteoarthritis of hip   . Ovarian cancer ([place]) 1995  . Protrusio acetabuli    diagnosed at age 89  . Sessile colonic polyp    noted 09/2011 -- > rec colonoscopy in 10 years, 2023  . Surgical menopause 08/2007   On HRT. Occuring since 01/09 following TAH and R-salpingo-oophorectomy   . Tibial fracture 10/2008   Left - Sustained 2/2 fall down stairs. Patient is S/P closed internal fixation with Smith Nephew tibial nail locked proximal and distal  . Vitamin D deficiency 11/2009   Vit D level 15 in 11/2009    Past Surgical History:  Procedure Laterality Date  . ABDOMINAL HYSTERECTOMY    . CESAREAN SECTION  08/2005   Primary low transverse  . COLONOSCOPY  09/22/2011   [id number] polyp in the rectum/Internal hemorrhoids, benign path  . ESOPHAGOGASTRODUODENOSCOPY  09/22/2011   [id number], esophageal/Sessile polyp in the body of the stomach/Hiatal  hernia/ABDOMINAL PAIN LIKELY FUNCTIONAL V. POST-INFECTIOUS IBS, path: mild chronic gastritis  . FRACTURE SURGERY     Right hip  . JOINT REPLACEMENT     right hip  . LAPAROSCOPY  10/2000   Operative laparoscopy with lysis of right adnexal adhesions and uterointestinal adhesions  . left tib fib    . OTHER SURGICAL HISTORY  02/2009   Closed treatment internal fixation, left tibia with Tamala Julian and Nephew tibial nail locked proximal and distal  . TOTAL ABDOMINAL HYSTERECTOMY W/ BILATERAL SALPINGOOPHORECTOMY  08/2007   for endometriosis. With concurrent right salpingo-oophorectomy  (2009), left oophorectomy (1995)  . TUBAL LIGATION  08/2005   Right tubal ligation    There were no vitals filed for this  visit.  Subjective Assessment - 07/15/19 0919    Subjective  Pt stated she has a dull ache with some sharp pain Lt neck, pain scale 7/10 neck, back and Lt hip.    Pertinent History  Bilateral THA    Patient Stated Goals  Get back to normal    Currently in Pain?  Yes    Pain Score  7     Pain Location  Back                       [facility] Adult PT Treatment/Exercise - 07/15/19 0001      Ambulation/Gait   Ambulation/Gait  Yes    Ambulation/Gait Assistance  5: Supervision    Ambulation Distance (Feet)  200 Feet    Assistive device  Rolling walker    Gait Pattern  Step-to pattern;Decreased stride length;Decreased hip/knee flexion - left;Decreased dorsiflexion - left;Trunk flexed;Poor foot clearance - left;Poor foot clearance - right;Antalgic    Ambulation Surface  Level;Indoor    Gait velocity  very slow velocity      Exercises   Exercises  Neck;Lumbar      Neck Exercises: Seated   Other Seated Exercise  3x STS with elevated height, cueing for mechanics and min A      Lumbar Exercises: Stretches   Standing Extension  10 reps;10 seconds    Standing Extension Limitations  standing with back against // bars    Prone on Elbows Stretch  2 reps;60 seconds    Press Ups Limitations  attempted, weak UE and pain lumbar      Lumbar Exercises: Standing   Other Standing Lumbar Exercises  weight shifting wiht and wihtout HHA (guard), rockerboard x 2 min; standing rotation wiht HHA    Other Standing Lumbar Exercises  Gait 2RT with arms on // bars to improve rotation       Manual Therapy   Manual Therapy  Soft tissue mobilization    Manual therapy comments  Manual complete separate than rest of tx    Soft tissue mobilization  Prone:  erector spinal, QL, UTs               PT Short Term Goals - 07/11/19 1831      PT SHORT TERM GOAL #1   Title  Patient will be independant with initial HEP to improve functional outcomes    Time  3    Period  Weeks    Status  Achieved     Target Date  07/11/19      PT SHORT TERM GOAL #2   Title  Patient will report less than 7/10 pain globally, on average, at rest, to improve daily function    Time  3    Period  Weeks    Status  On-going    Target Date  08/01/19        PT Long Term Goals - 07/11/19 1832      PT LONG TERM GOAL #1   Title  Patient will acheive >150 feet using LRAD with 2MWT in order to improve functional mobility in community.    Baseline  60 feet; current: 105 feet    Time  6    Period  Weeks    Status  On-going    Target Date  08/01/19      PT LONG TERM GOAL #2   Title  Patient will perform 5x STS in <30 seconds to improve functional mobility transfers.    Baseline  STS x 1: 16 seconds; current: 83 seconds with use of both arms    Time  6    Period  Weeks    Status  On-going    Target Date  08/01/19      PT LONG TERM GOAL #3   Title  Patient will be able to perform staggered stance with both feet >20 seconds in oreder to improve stability with functional mobility and reduce risk of falls.    Baseline  unable    Time  6    Period  Weeks    Status  On-going    Target Date  08/01/19      PT LONG TERM GOAL #4   Title  Patient will improve cervical rotation by 10 degrees in each direction in order to scan environment and improve safetry awareness with funcitonal activity and ADLs.    Baseline  37 deg RT; 22 deg LT    Time  6    Period  Weeks    Status  On-going    Target Date  08/01/19            Plan - 07/15/19 1302    Clinical Impression Statement  Continued with extension based exercises, positive reports for pain control.   Pt continues to ambulate/overall movements with increased guarding and stiffness  Gait training/education to improve rotation with hip during gait with verbal and tactile cueing.  Trial with prone push-up, UE weakness and reports of increased lumbar pain.  POE does continue to reports pain reduction.  EOS with manual soft tissue mobilization to address  restricitons in erector spinae, QL and UT for pain control and mobility.  No reoprts of increased pain through sesison.    Personal Factors and Comorbidities  Comorbidity 2    Comorbidities  Globalized pain in multiple sites    Examination-Activity Limitations  Bed Mobility;Sleep;Bend;Squat;Bathing;Sit;Carry;Stand;Dressing;Transfers;Lift;Locomotion Level;Stairs    Examination-Participation Restrictions  Cleaning;Community Activity;Dorita Sciara    Stability/Clinical Decision Making  Evolving/Moderate complexity    Clinical Decision Making  Moderate    Rehab Potential  Fair    PT Frequency  3x / week    PT Duration  3 weeks    PT Treatment/Interventions  ADLs/Self Care Home Management;DME Instruction;Biofeedback;Gait training;Stair training;Cryotherapy;Electrical Stimulation;Functional mobility training;Therapeutic activities;Moist Heat;Therapeutic exercise;Balance training;Manual techniques;Taping;Wheelchair mobility training;Orthotic Fit/Training;Patient/family education;Energy conservation;Joint Manipulations;Spinal Manipulations;Passive range of motion;Neuromuscular re-education    PT Next Visit Plan  Sit to stand next session.  Gait training wiht RW.  Continue mobility exercises (3D cervical excursions, LTR, shoulder roll), postural strengthening.  Add and progress standing LBE strengthening as tolerated.    PT Home Exercise Plan  06/22/19: 3D cervical excursion, shoulder rolls and scapular retraction. 11/13: SKTC, decompression exercise. 12/02: prone and POE  Patient will benefit from skilled therapeutic intervention in order to improve the following deficits and impairments:  Abnormal gait, Decreased endurance, Decreased strength, Decreased activity tolerance, Decreased balance, Decreased mobility, Difficulty walking, Pain, Decreased range of motion, Impaired flexibility, Improper body mechanics, Postural dysfunction  Visit Diagnosis: Pain in right hip  Cervicalgia  Pain in left  hip  Other abnormalities of gait and mobility  Muscle weakness (generalized)  Acute left-sided low back pain without sciatica     Problem List Patient Active Problem List   Diagnosis Date Noted  . Concussion with no loss of consciousness 08/12/2018  . Acute pain of left shoulder 08/12/2018  . Low back pain 08/12/2018  . Allergic rhinitis 08/05/2018  . Arthritis 07/01/2018  . DDD (degenerative disc disease), lumbar 05/21/2018  . Thoracic arthritis 05/21/2018  . DDD (degenerative disc disease), cervical 01/06/2018  . Hidradenitis suppurativa 10/11/2017  . Chronic frontal sinusitis 10/11/2017  . Fatty liver 10/11/2017  . Chronic ethmoidal sinusitis 09/13/2017  . Photophobia of both eyes 09/13/2017  . Abnormal liver enzymes 09/13/2017  . History of hysterectomy with bilateral oophorectomy 06/24/2017  . Estrogen deficiency 06/24/2017  . Overweight 11/17/2016  . Abdominal pain 11/04/2016  . Headache 12/17/2015  . Chronic constipation 10/12/2012  . Hot flashes 02/20/2012  . Hiatal hernia 02/20/2012  . Chronic pain of both hips 02/20/2012  . Fatigue 12/19/2011  . Acetabulum intrapelvic protrusion into pelvic region or thigh 05/15/2011  . Annual physical exam 10/03/2010  . Hypertension 03/20/2010  . Vitamin D deficiency 08/20/2009  . Anxiety 05/18/2009  . Migraine headache 05/13/2006   Ihor Austin, Corinne; Blessing   Aldona Lento 07/15/2019, 1:12 PM  [facility] [address] [place], [place], [address] Phone: [phone]   Fax:  [phone]  Name: SHANTAYA MARIE MRN: [id number] Date of Birth: [birth year]

## 2019-07-18 ENCOUNTER — Encounter (HOSPITAL_COMMUNITY): Payer: BC Managed Care – PPO | Admitting: Physical Therapy

## 2019-07-18 ENCOUNTER — Telehealth (HOSPITAL_COMMUNITY): Payer: Self-pay | Admitting: Physical Therapy
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
## 2019-07-18 NOTE — Telephone Encounter (Signed)
pt called to cancel this appt this morning due to she has another conflicting appt in [place]

## 2019-07-20 ENCOUNTER — Encounter (HOSPITAL_COMMUNITY): Payer: Self-pay | Admitting: Physical Therapy

## 2019-07-20 ENCOUNTER — Other Ambulatory Visit: Payer: Self-pay

## 2019-07-20 ENCOUNTER — Ambulatory Visit (HOSPITAL_COMMUNITY): Payer: BC Managed Care – PPO | Admitting: Physical Therapy

## 2019-07-20 DIAGNOSIS — M545 Low back pain, unspecified: Secondary | ICD-10-CM

## 2019-07-20 DIAGNOSIS — M25552 Pain in left hip: Secondary | ICD-10-CM

## 2019-07-20 DIAGNOSIS — M6281 Muscle weakness (generalized): Secondary | ICD-10-CM

## 2019-07-20 DIAGNOSIS — M25551 Pain in right hip: Secondary | ICD-10-CM | POA: Diagnosis not present

## 2019-07-20 DIAGNOSIS — R2689 Other abnormalities of gait and mobility: Secondary | ICD-10-CM

## 2019-07-20 DIAGNOSIS — M542 Cervicalgia: Secondary | ICD-10-CM

## 2019-07-20 NOTE — Therapy (Signed)
[place], [place], [address] Phone: [phone]   Fax:  [phone]  Physical Therapy Treatment  Patient Details  Name: [name] MRN: [id number] Date of Birth: April 29, 1976 Referring Provider (PT): McLean-Scocuzza, Olivia Mackie   Encounter Date: 07/20/2019  PT End of Session - 07/20/19 0911    Visit Number  7    Number of Visits  18    Date for PT Re-Evaluation  08/01/19    Authorization Type  60 visit limit    Authorization Time Period  06/20/19-08/01/19; progress note complete 11/30 visit #4    Authorization - Visit Number  7    Authorization - Number of Visits  60    PT Start Time  0905    PT Stop Time  0950    PT Time Calculation (min)  45 min    Equipment Utilized During Treatment  --    Activity Tolerance  Patient limited by pain;Patient tolerated treatment well    Behavior During Therapy  [facility] for tasks assessed/performed       Past Medical History:  Diagnosis Date  . Abdominal pain, epigastric 12/17/2015  . Anxiety    pt denies having dx of anxiety  . BCC (basal cell carcinoma of skin)    right foot   . Bunion, right foot   . Chronic cough   . Closed fibular fracture 10/2008   Left - Sustained 2/2 fall down stairs (same fall as tibular fracture). S/P internal fixation.  . Duodenitis    EGD (07/14/2011) showing chronic duodenitis, H.pyloir neg  . Elevated liver enzymes   . Endometriosis    S/P TAH and salpingo-oophorectomy, right  . Epigastric pain    Chronic, thought 2/2 gastritis, EGD (07/14/2011) showing chronic duodenitis, H.pyloir neg // Repeat EGD (09/2011) - esophagealring, sessible polyp in stomach body, hiatal hernia - rec ad probiotic, await bx  . Esophageal ring    s/p dilatation during EGD (07/2011) - Dr. Oneida Alar  . Fatty liver   . Gastritis    Thought due to chronic NSAID use 2/2 pain from her congenital hip abnormality  . Headache(784.0)    Migraines  . Hidradenitis suppurativa  05/2006  . 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 With chronic OA of hip  . History of migraine headaches    Typical symptoms - bright lights, unilateral (starting behind her left ear), throbbing .  Marland Kitchen Hot flashes   . Hypertension   . Internal hemorrhoids   . Osteoarthritis of hip   . Ovarian cancer ([place]) 1995  . Protrusio acetabuli    diagnosed at age 11  . Sessile colonic polyp    noted 09/2011 -- > rec colonoscopy in 10 years, 2023  . Surgical menopause 08/2007   On HRT. Occuring since 01/09 following TAH and R-salpingo-oophorectomy   . Tibial fracture 10/2008   Left - Sustained 2/2 fall down stairs. Patient is S/P closed internal fixation with Smith Nephew tibial nail locked proximal and distal  . Vitamin D deficiency 11/2009   Vit D level 15 in 11/2009    Past Surgical History:  Procedure Laterality Date  . ABDOMINAL HYSTERECTOMY    . CESAREAN SECTION  08/2005   Primary low transverse  . COLONOSCOPY  09/22/2011   [id number] polyp in the rectum/Internal hemorrhoids, benign path  . ESOPHAGOGASTRODUODENOSCOPY  09/22/2011   [id number], esophageal/Sessile polyp in the body of the stomach/Hiatal hernia/ABDOMINAL PAIN LIKELY  FUNCTIONAL V. POST-INFECTIOUS IBS, path: mild chronic gastritis  . FRACTURE SURGERY     Right hip  . JOINT REPLACEMENT     right hip  . LAPAROSCOPY  10/2000   Operative laparoscopy with lysis of right adnexal adhesions and uterointestinal adhesions  . left tib fib    . OTHER SURGICAL HISTORY  02/2009   Closed treatment internal fixation, left tibia with Tamala Julian and Nephew tibial nail locked proximal and distal  . TOTAL ABDOMINAL HYSTERECTOMY W/ BILATERAL SALPINGOOPHORECTOMY  08/2007   for endometriosis. With concurrent right salpingo-oophorectomy  (2009), left oophorectomy (1995)  . TUBAL LIGATION  08/2005   Right tubal ligation    There were no vitals filed for this visit.  Subjective  Assessment - 07/20/19 0908    Subjective  Patient says she is stiff this morning. Patient says home exercise is coming along pretty good, and that added POE stretching last visit felt good and helped to reduce tension in her low back.    Pertinent History  Bilateral THA    Patient Stated Goals  Get back to normal    Currently in Pain?  Yes    Pain Score  8     Pain Location  Back    Pain Orientation  Posterior;Lower;Right    Pain Descriptors / Indicators  Sharp    Pain Score  7    Pain Location  Neck    Pain Orientation  Left    Pain Descriptors / Indicators  Tightness    Pain Type  Acute pain                       [facility] Adult PT Treatment/Exercise - 07/20/19 0001      Lumbar Exercises: Standing   Heel Raises  10 reps    Other Standing Lumbar Exercises  Standing hip extension x10 each     Other Standing Lumbar Exercises  staggered stance with HHA x 2, 30 sec each; normal stance with eyes closed 20 sec, HHA x2       Lumbar Exercises: Seated   Sit to Stand  5 reps   elevated surface using low mat; BUE assist     Manual Therapy   Manual Therapy  Soft tissue mobilization    Manual therapy comments  Manual complete separate than rest of tx    Soft tissue mobilization  IASTM to bilateral lumbar parspinals in prone; STM to RT periscapular area using tennis ball                PT Short Term Goals - 07/11/19 1831      PT SHORT TERM GOAL #1   Title  Patient will be independant with initial HEP to improve functional outcomes    Time  3    Period  Weeks    Status  Achieved    Target Date  07/11/19      PT SHORT TERM GOAL #2   Title  Patient will report less than 7/10 pain globally, on average, at rest, to improve daily function    Time  3    Period  Weeks    Status  On-going    Target Date  08/01/19        PT Long Term Goals - 07/11/19 1832      PT LONG TERM GOAL #1   Title  Patient will acheive >150 feet using LRAD with 2MWT in order to improve  functional mobility in community.  Baseline  60 feet; current: 105 feet    Time  6    Period  Weeks    Status  On-going    Target Date  08/01/19      PT LONG TERM GOAL #2   Title  Patient will perform 5x STS in <30 seconds to improve functional mobility transfers.    Baseline  STS x 1: 16 seconds; current: 83 seconds with use of both arms    Time  6    Period  Weeks    Status  On-going    Target Date  08/01/19      PT LONG TERM GOAL #3   Title  Patient will be able to perform staggered stance with both feet >20 seconds in oreder to improve stability with functional mobility and reduce risk of falls.    Baseline  unable    Time  6    Period  Weeks    Status  On-going    Target Date  08/01/19      PT LONG TERM GOAL #4   Title  Patient will improve cervical rotation by 10 degrees in each direction in order to scan environment and improve safetry awareness with funcitonal activity and ADLs.    Baseline  37 deg RT; 22 deg LT    Time  6    Period  Weeks    Status  On-going    Target Date  08/01/19            Plan - 07/20/19 1004    Clinical Impression Statement  Patient continues to be limited in function by pain. Performed manuals first this session to see if it would improve patient performance by reducing pain. Patient reports more benefit from manuals post treatment. Patient shows significant weakness in BLEs with sit to stand transfers, and relies heavily on upper extremities for performing. Patient was elevated so knees were approx 45 degrees, patient still unable to perform sit to stand without heavy use of arms. Tested patients balance with staggered stance and normal base with eyes close, patient had extreme difficulty. Patient showed mod sway with HHA x 2 and noted feeling of generalized unsteadiness and feeling "off". Continue to monitor these types of responses to activity in regard to possibility of neurological nature.    Personal Factors and Comorbidities   Comorbidity 2    Comorbidities  Globalized pain in multiple sites    Examination-Activity Limitations  Bed Mobility;Sleep;Bend;Squat;Bathing;Sit;Carry;Stand;Dressing;Transfers;Lift;Locomotion Level;Stairs    Examination-Participation Restrictions  Cleaning;Community Activity;[employer]    Stability/Clinical Decision Making  Evolving/Moderate complexity    Rehab Potential  Fair    PT Frequency  3x / week    PT Duration  3 weeks    PT Treatment/Interventions  ADLs/Self Care Home Management;DME Instruction;Biofeedback;Gait training;Stair training;Cryotherapy;Electrical Stimulation;Functional mobility training;Therapeutic activities;Moist Heat;Therapeutic exercise;Balance training;Manual techniques;Taping;Wheelchair mobility training;Orthotic Fit/Training;Patient/family education;Energy conservation;Joint Manipulations;Spinal Manipulations;Passive range of motion;Neuromuscular re-education    PT Next Visit Plan  Continue to progress BLE strength as tolerated. Continue balance work, monitor sx per suspicion of possible neuro involvement. Manuals at end of session    PT Home Exercise Plan  06/22/19: 3D cervical excursion, shoulder rolls and scapular retraction. 11/13: SKTC, decompression exercise. 12/02: prone and POE       Patient will benefit from skilled therapeutic intervention in order to improve the following deficits and impairments:  Abnormal gait, Decreased endurance, Decreased strength, Decreased activity tolerance, Decreased balance, Decreased mobility, Difficulty walking, Pain, Decreased range of motion, Impaired flexibility, Improper body  mechanics, Postural dysfunction  Visit Diagnosis: Pain in right hip  Cervicalgia  Pain in left hip  Other abnormalities of gait and mobility  Muscle weakness (generalized)  Acute left-sided low back pain without sciatica     Problem List Patient Active Problem List   Diagnosis Date Noted  . Concussion with no loss of consciousness  08/12/2018  . Acute pain of left shoulder 08/12/2018  . Low back pain 08/12/2018  . Allergic rhinitis 08/05/2018  . Arthritis 07/01/2018  . DDD (degenerative disc disease), lumbar 05/21/2018  . Thoracic arthritis 05/21/2018  . DDD (degenerative disc disease), cervical 01/06/2018  . Hidradenitis suppurativa 10/11/2017  . Chronic frontal sinusitis 10/11/2017  . Fatty liver 10/11/2017  . Chronic ethmoidal sinusitis 09/13/2017  . Photophobia of both eyes 09/13/2017  . Abnormal liver enzymes 09/13/2017  . History of hysterectomy with bilateral oophorectomy 06/24/2017  . Estrogen deficiency 06/24/2017  . Overweight 11/17/2016  . Abdominal pain 11/04/2016  . Headache 12/17/2015  . Chronic constipation 10/12/2012  . Hot flashes 02/20/2012  . Hiatal hernia 02/20/2012  . Chronic pain of both hips 02/20/2012  . Fatigue 12/19/2011  . Acetabulum intrapelvic protrusion into pelvic region or thigh 05/15/2011  . Annual physical exam 10/03/2010  . Hypertension 03/20/2010  . Vitamin D deficiency 08/20/2009  . Anxiety 05/18/2009  . Migraine headache 05/13/2006    10:08 AM, 07/20/19 Josue Hector PT DPT  Physical Therapist with [facility]  [phone] [place] [address] [place], [place], [address] Phone: [phone]   Fax:  [phone]  Name: [name] MRN: [id number] Date of Birth: November 09, 1975

## 2019-07-22 ENCOUNTER — Telehealth (HOSPITAL_COMMUNITY): Payer: Self-pay

## 2019-07-22 ENCOUNTER — Ambulatory Visit (HOSPITAL_COMMUNITY): Payer: BC Managed Care – PPO

## 2019-07-22 NOTE — Telephone Encounter (Signed)
Patient took daughter to get a COVID test. The results will be in on Sunday and she will let us know about Monday appt.

## 2019-07-25 ENCOUNTER — Ambulatory Visit (HOSPITAL_COMMUNITY): Payer: BC Managed Care – PPO | Admitting: Physical Therapy

## 2019-07-25 ENCOUNTER — Telehealth (HOSPITAL_COMMUNITY): Payer: Self-pay | Admitting: Physical Therapy

## 2019-07-25 NOTE — Telephone Encounter (Signed)
pt has to do a procedure on this day she needed to cancel the appt.

## 2019-07-25 NOTE — Telephone Encounter (Signed)
pt dtr had a covid test but had not had no results yet mom called to cancel and is awaiting those test results since she was around her dtr

## 2019-07-27 ENCOUNTER — Encounter (HOSPITAL_COMMUNITY): Payer: BC Managed Care – PPO | Admitting: Physical Therapy

## 2019-07-27 ENCOUNTER — Telehealth (HOSPITAL_COMMUNITY): Payer: Self-pay | Admitting: Physical Therapy

## 2019-07-27 NOTE — Telephone Encounter (Signed)
Called to let patient know about missed appointment today at [facility]. Left voice message to call and schedule future appointments as no more are currently scheduled.   10:03 AM, 07/27/19 Josue Hector PT DPT  Physical Therapist with [facility]  [phone]

## 2019-07-29 ENCOUNTER — Encounter (HOSPITAL_COMMUNITY): Payer: BC Managed Care – PPO | Admitting: Physical Therapy

## 2019-08-09 ENCOUNTER — Encounter: Payer: Self-pay | Admitting: Internal Medicine

## 2019-08-09 ENCOUNTER — Ambulatory Visit (INDEPENDENT_AMBULATORY_CARE_PROVIDER_SITE_OTHER): Payer: BC Managed Care – PPO | Admitting: Internal Medicine

## 2019-08-09 ENCOUNTER — Other Ambulatory Visit: Payer: Self-pay

## 2019-08-09 VITALS — Ht 72.0 in | Wt 165.0 lb

## 2019-08-09 DIAGNOSIS — G43909 Migraine, unspecified, not intractable, without status migrainosus: Secondary | ICD-10-CM | POA: Diagnosis not present

## 2019-08-09 DIAGNOSIS — M545 Low back pain, unspecified: Secondary | ICD-10-CM

## 2019-08-09 DIAGNOSIS — M25552 Pain in left hip: Secondary | ICD-10-CM | POA: Diagnosis not present

## 2019-08-09 DIAGNOSIS — R269 Unspecified abnormalities of gait and mobility: Secondary | ICD-10-CM

## 2019-08-09 DIAGNOSIS — M25551 Pain in right hip: Secondary | ICD-10-CM | POA: Diagnosis not present

## 2019-08-09 DIAGNOSIS — R413 Other amnesia: Secondary | ICD-10-CM | POA: Diagnosis not present

## 2019-08-09 DIAGNOSIS — M542 Cervicalgia: Secondary | ICD-10-CM | POA: Diagnosis not present

## 2019-08-09 MED ORDER — MELOXICAM 15 MG PO TABS: 15.0000 mg | ORAL_TABLET | Freq: Every day | ORAL | 2 refills | Status: DC | PRN
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
## 2019-08-09 NOTE — Patient Instructions (Signed)
Hip Exercises Ask your health care provider which exercises are safe for you. Do exercises exactly as told by your health care provider and adjust them as directed. It is normal to feel mild stretching, pulling, tightness, or discomfort as you do these exercises. Stop right away if you feel sudden pain or your pain gets worse. Do not begin these exercises until told by your health care provider. Stretching and range-of-motion exercises These exercises warm up your muscles and joints and improve the movement and flexibility of your hip. These exercises also help to relieve pain, numbness, and tingling. You may be asked to limit your range of motion if you had a hip replacement. Talk to your health care provider about these restrictions. Hamstrings, supine  1. Lie on your back (supine position). 2. Loop a belt or towel over the ball of your left / right foot. The ball of your foot is on the walking surface, right under your toes. 3. Straighten your left / right knee and slowly pull on the belt or towel to raise your leg until you feel a gentle stretch behind your knee (hamstring). ? Do not let your knee bend while you do this. ? Keep your other leg flat on the floor. 4. Hold this position for __________ seconds. 5. Slowly return your leg to the starting position. Repeat __________ times. Complete this exercise __________ times a day. Hip rotation  1. Lie on your back on a firm surface. 2. With your left / right hand, gently pull your left / right knee toward the shoulder that is on the same side of the body. Stop when your knee is pointing toward the ceiling. 3. Hold your left / right ankle with your other hand. 4. Keeping your knee steady, gently pull your left / right ankle toward your other shoulder until you feel a stretch in your buttocks. ? Keep your hips and shoulders firmly planted while you do this stretch. 5. Hold this position for __________ seconds. Repeat __________ times. Complete  this exercise __________ times a day. Seated stretch This exercise is sometimes called hamstrings and adductors stretch. 1. Sit on the floor with your legs stretched wide. Keep your knees straight during this exercise. 2. Keeping your head and back in a straight line, bend at your waist to reach for your left foot (position A). You should feel a stretch in your right inner thigh (adductors). 3. Hold this position for __________ seconds. Then slowly return to the upright position. 4. Keeping your head and back in a straight line, bend at your waist to reach forward (position B). You should feel a stretch behind both of your thighs and knees (hamstrings). 5. Hold this position for __________ seconds. Then slowly return to the upright position. 6. Keeping your head and back in a straight line, bend at your waist to reach for your right foot (position C). You should feel a stretch in your left inner thigh (adductors). 7. Hold this position for __________ seconds. Then slowly return to the upright position. Repeat __________ times. Complete this exercise __________ times a day. Lunge This exercise stretches the muscles of the hip (hip flexors). 1. Place your left / right knee on the floor and bend your other knee so that is directly over your ankle. You should be half-kneeling. 2. Keep good posture with your head over your shoulders. 3. Tighten your buttocks to point your tailbone downward. This will prevent your back from arching too much. 4. You should feel a  gentle stretch in the front of your left / right thigh and hip. If you do not feel a stretch, slide your other foot forward slightly and then slowly lunge forward with your chest up until your knee once again lines up over your ankle. ? Make sure your tailbone continues to point downward. 5. Hold this position for __________ seconds. 6. Slowly return to the starting position. Repeat __________ times. Complete this exercise __________ times a  day. Strengthening exercises These exercises build strength and endurance in your hip. Endurance is the ability to use your muscles for a long time, even after they get tired. Bridge This exercise strengthens the muscles of your hip (hip extensors). 1. Lie on your back on a firm surface with your knees bent and your feet flat on the floor. 2. Tighten your buttocks muscles and lift your bottom off the floor until the trunk of your body and your hips are level with your thighs. ? Do not arch your back. ? You should feel the muscles working in your buttocks and the back of your thighs. If you do not feel these muscles, slide your feet 1-2 inches (2.5-5 cm) farther away from your buttocks. 3. Hold this position for __________ seconds. 4. Slowly lower your hips to the starting position. 5. Let your muscles relax completely between repetitions. Repeat __________ times. Complete this exercise __________ times a day. Straight leg raises, side-lying This exercise strengthens the muscles that move the hip joint away from the center of the body (hip abductors). 1. Lie on your side with your left / right leg in the top position. Lie so your head, shoulder, hip, and knee line up. You may bend your bottom knee slightly to help you balance. 2. Roll your hips slightly forward, so your hips are stacked directly over each other and your left / right knee is facing forward. 3. Leading with your heel, lift your top leg 4-6 inches (10-15 cm). You should feel the muscles in your top hip lifting. ? Do not let your foot drift forward. ? Do not let your knee roll toward the ceiling. 4. Hold this position for __________ seconds. 5. Slowly return to the starting position. 6. Let your muscles relax completely between repetitions. Repeat __________ times. Complete this exercise __________ times a day. Straight leg raises, side-lying This exercise strengthen the muscles that move the hip joint toward the center of the  body (hip adductors). 1. Lie on your side with your left / right leg in the bottom position. Lie so your head, shoulder, hip, and knee line up. You may place your upper foot in front to help you balance. 2. Roll your hips slightly forward, so your hips are stacked directly over each other and your left / right knee is facing forward. 3. Tense the muscles in your inner thigh and lift your bottom leg 4-6 inches (10-15 cm). 4. Hold this position for __________ seconds. 5. Slowly return to the starting position. 6. Let your muscles relax completely between repetitions. Repeat __________ times. Complete this exercise __________ times a day. Straight leg raises, supine This exercise strengthens the muscles in the front of your thigh (quadriceps). 1. Lie on your back (supine position) with your left / right leg extended and your other knee bent. 2. Tense the muscles in the front of your left / right thigh. You should see your kneecap slide up or see increased dimpling just above your knee. 3. Keep these muscles tight as you raise your   leg 4-6 inches (10-15 cm) off the floor. Do not let your knee bend. 4. Hold this position for __________ seconds. 5. Keep these muscles tense as you lower your leg. 6. Relax the muscles slowly and completely between repetitions. Repeat __________ times. Complete this exercise __________ times a day. Hip abductors, standing This exercise strengthens the muscles that move the leg and hip joint away from the center of the body (hip abductors). 1. Tie one end of a rubber exercise band or tubing to a secure surface, such as a chair, table, or pole. 2. Loop the other end of the band or tubing around your left / right ankle. 3. Keeping your ankle with the band or tubing directly opposite the secured end, step away until there is tension in the tubing or band. Hold on to a chair, table, or pole as needed for balance. 4. Lift your left / right leg out to your side. While you do  this: ? Keep your back upright. ? Keep your shoulders over your hips. ? Keep your toes pointing forward. ? Make sure to use your hip muscles to slowly lift your leg. Do not tip your body or forcefully lift your leg. 5. Hold this position for __________ seconds. 6. Slowly return to the starting position. Repeat __________ times. Complete this exercise __________ times a day. Squats This exercise strengthens the muscles in the front of your thigh (quadriceps). 1. Stand in a door frame so your feet and knees are in line with the frame. You may place your hands on the frame for balance. 2. Slowly bend your knees and lower your hips like you are going to sit in a chair. ? Keep your lower legs in a straight-up-and-down position. ? Do not let your hips go lower than your knees. ? Do not bend your knees lower than told by your health care provider. ? If your hip pain increases, do not bend as low. 3. Hold this position for ___________ seconds. 4. Slowly push with your legs to return to standing. Do not use your hands to pull yourself to standing. Repeat __________ times. Complete this exercise __________ times a day. This information is not intended to replace advice given to you by your health care provider. Make sure you discuss any questions you have with your health care provider. Document Released: 08/15/2005 Document Revised: 06/08/2018 Document Reviewed: 06/08/2018 Elsevier Patient Education  2020 [place].  Back Exercises The following exercises strengthen the muscles that help to support the trunk and back. They also help to keep the lower back flexible. Doing these exercises can help to prevent back pain or lessen existing pain.  If you have back pain or discomfort, try doing these exercises 2-3 times each day or as told by your health care provider.  As your pain improves, do them once each day, but increase the number of times that you repeat the steps for each exercise (do more  repetitions).  To prevent the recurrence of back pain, continue to do these exercises once each day or as told by your health care provider. Do exercises exactly as told by your health care provider and adjust them as directed. It is normal to feel mild stretching, pulling, tightness, or discomfort as you do these exercises, but you should stop right away if you feel sudden pain or your pain gets worse. Exercises Single knee to chest Repeat these steps 3-5 times for each leg: 1. Lie on your back on a firm bed or  the floor with your legs extended. 2. Bring one knee to your chest. Your other leg should stay extended and in contact with the floor. 3. Hold your knee in place by grabbing your knee or thigh with both hands and hold. 4. Pull on your knee until you feel a gentle stretch in your lower back or buttocks. 5. Hold the stretch for 10-30 seconds. 6. Slowly release and straighten your leg. Pelvic tilt Repeat these steps 5-10 times: 1. Lie on your back on a firm bed or the floor with your legs extended. 2. Bend your knees so they are pointing toward the ceiling and your feet are flat on the floor. 3. Tighten your lower abdominal muscles to press your lower back against the floor. This motion will tilt your pelvis so your tailbone points up toward the ceiling instead of pointing to your feet or the floor. 4. With gentle tension and even breathing, hold this position for 5-10 seconds. Cat-cow Repeat these steps until your lower back becomes more flexible: 1. Get into a hands-and-knees position on a firm surface. Keep your hands under your shoulders, and keep your knees under your hips. You may place padding under your knees for comfort. 2. Let your head hang down toward your chest. Contract your abdominal muscles and point your tailbone toward the floor so your lower back becomes rounded like the back of a cat. 3. Hold this position for 5 seconds. 4. Slowly lift your head, let your abdominal  muscles relax and point your tailbone up toward the ceiling so your back forms a sagging arch like the back of a cow. 5. Hold this position for 5 seconds.  Press-ups Repeat these steps 5-10 times: 1. Lie on your abdomen (face-down) on the floor. 2. Place your palms near your head, about shoulder-width apart. 3. Keeping your back as relaxed as possible and keeping your hips on the floor, slowly straighten your arms to raise the top half of your body and lift your shoulders. Do not use your back muscles to raise your upper torso. You may adjust the placement of your hands to make yourself more comfortable. 4. Hold this position for 5 seconds while you keep your back relaxed. 5. Slowly return to lying flat on the floor.  Bridges Repeat these steps 10 times: 1. Lie on your back on a firm surface. 2. Bend your knees so they are pointing toward the ceiling and your feet are flat on the floor. Your arms should be flat at your sides, next to your body. 3. Tighten your buttocks muscles and lift your buttocks off the floor until your waist is at almost the same height as your knees. You should feel the muscles working in your buttocks and the back of your thighs. If you do not feel these muscles, slide your feet 1-2 inches farther away from your buttocks. 4. Hold this position for 3-5 seconds. 5. Slowly lower your hips to the starting position, and allow your buttocks muscles to relax completely. If this exercise is too easy, try doing it with your arms crossed over your chest. Abdominal crunches Repeat these steps 5-10 times: 1. Lie on your back on a firm bed or the floor with your legs extended. 2. Bend your knees so they are pointing toward the ceiling and your feet are flat on the floor. 3. Cross your arms over your chest. 4. Tip your chin slightly toward your chest without bending your neck. 5. Tighten your abdominal muscles and slowly  raise your trunk (torso) high enough to lift your shoulder  blades a tiny bit off the floor. Avoid raising your torso higher than that because it can put too much stress on your low back and does not help to strengthen your abdominal muscles. 6. Slowly return to your starting position. Back lifts Repeat these steps 5-10 times: 1. Lie on your abdomen (face-down) with your arms at your sides, and rest your forehead on the floor. 2. Tighten the muscles in your legs and your buttocks. 3. Slowly lift your chest off the floor while you keep your hips pressed to the floor. Keep the back of your head in line with the curve in your back. Your eyes should be looking at the floor. 4. Hold this position for 3-5 seconds. 5. Slowly return to your starting position. Contact a health care provider if:  Your back pain or discomfort gets much worse when you do an exercise.  Your worsening back pain or discomfort does not lessen within 2 hours after you exercise. If you have any of these problems, stop doing these exercises right away. Do not do them again unless your health care provider says that you can. Get help right away if:  You develop sudden, severe back pain. If this happens, stop doing the exercises right away. Do not do them again unless your health care provider says that you can. This information is not intended to replace advice given to you by your health care provider. Make sure you discuss any questions you have with your health care provider. Document Released: 09/04/2004 Document Revised: 12/02/2018 Document Reviewed: 04/29/2018 Elsevier Patient Education  2020 [place].  Neck Exercises Ask your health care provider which exercises are safe for you. Do exercises exactly as told by your health care provider and adjust them as directed. It is normal to feel mild stretching, pulling, tightness, or discomfort as you do these exercises. Stop right away if you feel sudden pain or your pain gets worse. Do not begin these exercises until told by your  health care provider. Neck exercises can be important for many reasons. They can improve strength and maintain flexibility in your neck, which will help your upper back and prevent neck pain. Stretching exercises Rotation neck stretching  1. Sit in a chair or stand up. 2. Place your feet flat on the floor, shoulder width apart. 3. Slowly turn your head (rotate) to the right until a slight stretch is felt. Turn it all the way to the right so you can look over your right shoulder. Do not tilt or tip your head. 4. Hold this position for 10-30 seconds. 5. Slowly turn your head (rotate) to the left until a slight stretch is felt. Turn it all the way to the left so you can look over your left shoulder. Do not tilt or tip your head. 6. Hold this position for 10-30 seconds. Repeat __________ times. Complete this exercise __________ times a day. Neck retraction 1. Sit in a sturdy chair or stand up. 2. Look straight ahead. Do not bend your neck. 3. Use your fingers to push your chin backward (retraction). Do not bend your neck for this movement. Continue to face straight ahead. If you are doing the exercise properly, you will feel a slight sensation in your throat and a stretch at the back of your neck. 4. Hold the stretch for 1-2 seconds. Repeat __________ times. Complete this exercise __________ times a day. Strengthening exercises Neck press 1. Lie on  your back on a firm bed or on the floor with a pillow under your head. 2. Use your neck muscles to push your head down on the pillow and straighten your spine. 3. Hold the position as well as you can. Keep your head facing up (in a neutral position) and your chin tucked. 4. Slowly count to 5 while holding this position. Repeat __________ times. Complete this exercise __________ times a day. Isometrics These are exercises in which you strengthen the muscles in your neck while keeping your neck still (isometrics). 1. Sit in a supportive chair and  place your hand on your forehead. 2. Keep your head and face facing straight ahead. Do not flex or extend your neck while doing isometrics. 3. Push forward with your head and neck while pushing back with your hand. Hold for 10 seconds. 4. Do the sequence again, this time putting your hand against the back of your head. Use your head and neck to push backward against the hand pressure. 5. Finally, do the same exercise on either side of your head, pushing sideways against the pressure of your hand. Repeat __________ times. Complete this exercise __________ times a day. Prone head lifts 1. Lie face-down (prone position), resting on your elbows so that your chest and upper back are raised. 2. Start with your head facing downward, near your chest. Position your chin either on or near your chest. 3. Slowly lift your head upward. Lift until you are looking straight ahead. Then continue lifting your head as far back as you can comfortably stretch. 4. Hold your head up for 5 seconds. Then slowly lower it to your starting position. Repeat __________ times. Complete this exercise __________ times a day. Supine head lifts 1. Lie on your back (supine position), bending your knees to point to the ceiling and keeping your feet flat on the floor. 2. Lift your head slowly off the floor, raising your chin toward your chest. 3. Hold for 5 seconds. Repeat __________ times. Complete this exercise __________ times a day. Scapular retraction 1. Stand with your arms at your sides. Look straight ahead. 2. Slowly pull both shoulders (scapulae) backward and downward (retraction) until you feel a stretch between your shoulder blades in your upper back. 3. Hold for 10-30 seconds. 4. Relax and repeat. Repeat __________ times. Complete this exercise __________ times a day. Contact a health care provider if:  Your neck pain or discomfort gets much worse when you do an exercise.  Your neck pain or discomfort does not  improve within 2 hours after you exercise. If you have any of these problems, stop exercising right away. Do not do the exercises again unless your health care provider says that you can. Get help right away if:  You develop sudden, severe neck pain. If this happens, stop exercising right away. Do not do the exercises again unless your health care provider says that you can. This information is not intended to replace advice given to you by your health care provider. Make sure you discuss any questions you have with your health care provider. Document Released: 07/09/2015 Document Revised: 05/26/2018 Document Reviewed: 05/26/2018 Elsevier Patient Education  2020 [employer].

## 2019-08-10 DIAGNOSIS — D126 Benign neoplasm of colon, unspecified: Secondary | ICD-10-CM | POA: Diagnosis not present

## 2019-08-10 DIAGNOSIS — Z1211 Encounter for screening for malignant neoplasm of colon: Secondary | ICD-10-CM | POA: Diagnosis not present

## 2019-08-16 ENCOUNTER — Encounter: Payer: Self-pay | Admitting: Neurology

## 2019-08-16 ENCOUNTER — Ambulatory Visit (INDEPENDENT_AMBULATORY_CARE_PROVIDER_SITE_OTHER): Payer: BC Managed Care – PPO | Admitting: Neurology

## 2019-08-16 ENCOUNTER — Other Ambulatory Visit: Payer: Self-pay

## 2019-08-16 VITALS — BP 142/93 | HR 62 | Temp 97.1°F | Ht 72.0 in

## 2019-08-16 DIAGNOSIS — S134XXA Sprain of ligaments of cervical spine, initial encounter: Secondary | ICD-10-CM | POA: Diagnosis not present

## 2019-08-16 DIAGNOSIS — S161XXA Strain of muscle, fascia and tendon at neck level, initial encounter: Secondary | ICD-10-CM

## 2019-08-16 DIAGNOSIS — F0781 Postconcussional syndrome: Secondary | ICD-10-CM | POA: Diagnosis not present

## 2019-08-16 NOTE — Patient Instructions (Addendum)
Physical Therapy: Dry Needling cervical muscles Occupational Therapy: cognitive therapy You can try taking 10mg  of flexeril at bedtime and see if it helps, if you have side effects we can switch to something else like tizanidine.   Post-Concussion Syndrome  Post-concussion syndrome is when symptoms last longer than normal after a head injury. What are the signs or symptoms? After a head injury, you may:  Have headaches.  Feel tired.  Feel dizzy.  Feel weak.  Have trouble seeing.  Have trouble in bright lights.  Have trouble hearing.  Not be able to remember things.  Not be able to focus.  Have trouble sleeping.  Have mood swings.  Have trouble learning new things. These can last from weeks to months. Follow these instructions at home: Medicines  Take all medicines only as told by your doctor.  Do not take prescription pain medicines. Activity  Limit activities as told by your doctor. This includes: ? Homework. ? Job-related work. ? Thinking. ? Watching TV. ? Using a computer or phone. ? Puzzles. ? Exercise. ? Sports.  Slowly return to your normal activity as told by your doctor.  Stop an activity if you have symptoms.  Do not do anything that may cause you to get injured again. General instructions  Rest. Try to: ? Sleep 7-9 hours each night. ? Take naps or breaks when you feel tired during the day.  Do not drink alcohol until your doctor says that you can.  Keep track of your symptoms.  Keep all follow-up visits as told by your doctor. This is important. Contact a doctor if:  You do not improve.  You get worse.  You have another injury. Get help right away if:  You have a very bad headache.  You feel confused.  You feel very sleepy.  You pass out (faint).  You throw up (vomit).  You feel weak in any part of your body.  You feel numb in any part of your body.  You start shaking (have a seizure).  You have trouble  talking. Summary  Post-concussion syndrome is when symptoms last longer than normal after a head injury.  Limit all activity after your injury. Gradually return to normal activity as told by your doctor.  Rest, do not drink alcohol, and avoid prescription pain medicines after a concussion.  Call your doctor if your symptoms get worse. This information is not intended to replace advice given to you by your health care provider. Make sure you discuss any questions you have with your health care provider. Document Revised: 05/20/2018 Document Reviewed: 09/01/2017 Elsevier Patient Education  2020 [employer].
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
## 2019-08-23 ENCOUNTER — Ambulatory Visit (HOSPITAL_COMMUNITY): Payer: BC Managed Care – PPO

## 2019-08-23 ENCOUNTER — Encounter (HOSPITAL_COMMUNITY): Payer: Self-pay

## 2019-08-23 ENCOUNTER — Ambulatory Visit (HOSPITAL_COMMUNITY): Payer: BC Managed Care – PPO | Admitting: Physical Therapy

## 2019-08-23 ENCOUNTER — Other Ambulatory Visit: Payer: Self-pay

## 2019-08-23 DIAGNOSIS — Z6827 Body mass index (BMI) 27.0-27.9, adult: Secondary | ICD-10-CM | POA: Diagnosis not present

## 2019-08-23 DIAGNOSIS — K581 Irritable bowel syndrome with constipation: Secondary | ICD-10-CM | POA: Diagnosis not present

## 2019-08-23 DIAGNOSIS — Z79899 Other long term (current) drug therapy: Secondary | ICD-10-CM | POA: Diagnosis not present

## 2019-08-23 DIAGNOSIS — K760 Fatty (change of) liver, not elsewhere classified: Secondary | ICD-10-CM | POA: Diagnosis not present

## 2019-08-23 DIAGNOSIS — R140 Abdominal distension (gaseous): Secondary | ICD-10-CM | POA: Diagnosis not present

## 2019-08-31 ENCOUNTER — Ambulatory Visit (HOSPITAL_COMMUNITY): Payer: BC Managed Care – PPO | Admitting: Occupational Therapy

## 2019-09-02 ENCOUNTER — Ambulatory Visit (HOSPITAL_COMMUNITY): Payer: BC Managed Care – PPO | Admitting: Specialist

## 2019-09-02 ENCOUNTER — Ambulatory Visit (HOSPITAL_COMMUNITY): Payer: BC Managed Care – PPO | Admitting: Physical Therapy

## 2019-09-08 ENCOUNTER — Ambulatory Visit (HOSPITAL_COMMUNITY): Payer: BC Managed Care – PPO | Attending: Neurology | Admitting: Physical Therapy

## 2019-09-08 ENCOUNTER — Telehealth (HOSPITAL_COMMUNITY): Payer: Self-pay | Admitting: Physical Therapy

## 2019-09-08 ENCOUNTER — Encounter (HOSPITAL_COMMUNITY): Payer: Self-pay | Admitting: Physical Therapy

## 2019-09-08 ENCOUNTER — Other Ambulatory Visit: Payer: Self-pay

## 2019-09-08 ENCOUNTER — Ambulatory Visit (HOSPITAL_COMMUNITY): Payer: BC Managed Care – PPO | Admitting: Physical Therapy

## 2019-09-08 ENCOUNTER — Telehealth (HOSPITAL_COMMUNITY): Payer: Self-pay

## 2019-09-08 ENCOUNTER — Ambulatory Visit (HOSPITAL_COMMUNITY): Payer: BC Managed Care – PPO

## 2019-09-08 ENCOUNTER — Encounter (HOSPITAL_COMMUNITY): Payer: Self-pay

## 2019-09-08 DIAGNOSIS — M25552 Pain in left hip: Secondary | ICD-10-CM | POA: Diagnosis not present

## 2019-09-08 DIAGNOSIS — M542 Cervicalgia: Secondary | ICD-10-CM | POA: Diagnosis not present

## 2019-09-08 DIAGNOSIS — R41840 Attention and concentration deficit: Secondary | ICD-10-CM | POA: Diagnosis not present

## 2019-09-08 DIAGNOSIS — M545 Low back pain, unspecified: Secondary | ICD-10-CM

## 2019-09-08 DIAGNOSIS — R2689 Other abnormalities of gait and mobility: Secondary | ICD-10-CM | POA: Diagnosis not present

## 2019-09-08 DIAGNOSIS — R41842 Visuospatial deficit: Secondary | ICD-10-CM | POA: Diagnosis not present

## 2019-09-08 DIAGNOSIS — R29898 Other symptoms and signs involving the musculoskeletal system: Secondary | ICD-10-CM | POA: Diagnosis not present

## 2019-09-08 DIAGNOSIS — M6281 Muscle weakness (generalized): Secondary | ICD-10-CM | POA: Insufficient documentation

## 2019-09-08 DIAGNOSIS — R41844 Frontal lobe and executive function deficit: Secondary | ICD-10-CM | POA: Insufficient documentation

## 2019-09-08 NOTE — Telephone Encounter (Signed)
pt has been rescheduled due to she was  running late

## 2019-09-08 NOTE — Therapy (Signed)
[facility] [address] [place], [place], [address] Phone: [phone]   Fax:  [phone]  Patient Details  Name: [name] MRN: [id number] Date of Birth: 11/28/1975 Referring Provider:  McLean-Scocuzza, Tracy Encounter Date: 09/08/2019  PHYSICAL THERAPY DISCHARGE SUMMARY  Visits from Start of Care: 7  Current functional level related to goals / functional outcomes: Unable to reassess, patient did not return for follow up visits. Last visit dated 07/20/19   Remaining deficits: Unable to reassess   Education / Equipment: Patient DC due to noncompliance, did not return for follow up visits. Patient is scheduled to return to therapy for evaluation with new referral for cervical myofascial strain from Antonia Ahern MD Plan: Patient agrees to discharge.  Patient goals were not met. Patient is being discharged due to not returning since the last visit.  ?????        10:15 AM, 09/08/19   PT DPT  Physical Therapist with [place]  [facility]  [phone]    [facility] [address] [place], [place], [address] Phone: [phone]   Fax:  [phone] 

## 2019-09-08 NOTE — Telephone Encounter (Signed)
pt cancelled appt today because her kids were on a two hour delay from school. she will make her 10:30 appt with Mickel Baas
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
## 2019-09-09 ENCOUNTER — Ambulatory Visit (HOSPITAL_COMMUNITY): Payer: BC Managed Care – PPO | Admitting: Occupational Therapy

## 2019-09-09 ENCOUNTER — Encounter (HOSPITAL_COMMUNITY): Payer: Self-pay | Admitting: Occupational Therapy

## 2019-09-09 DIAGNOSIS — M6281 Muscle weakness (generalized): Secondary | ICD-10-CM | POA: Diagnosis not present

## 2019-09-09 DIAGNOSIS — M542 Cervicalgia: Secondary | ICD-10-CM | POA: Diagnosis not present

## 2019-09-09 DIAGNOSIS — R41844 Frontal lobe and executive function deficit: Secondary | ICD-10-CM | POA: Diagnosis not present

## 2019-09-09 DIAGNOSIS — R29898 Other symptoms and signs involving the musculoskeletal system: Secondary | ICD-10-CM | POA: Diagnosis not present

## 2019-09-09 DIAGNOSIS — R41842 Visuospatial deficit: Secondary | ICD-10-CM | POA: Diagnosis not present

## 2019-09-09 DIAGNOSIS — M25552 Pain in left hip: Secondary | ICD-10-CM | POA: Diagnosis not present

## 2019-09-09 DIAGNOSIS — R41840 Attention and concentration deficit: Secondary | ICD-10-CM | POA: Diagnosis not present

## 2019-09-09 DIAGNOSIS — M545 Low back pain: Secondary | ICD-10-CM | POA: Diagnosis not present

## 2019-09-09 DIAGNOSIS — R2689 Other abnormalities of gait and mobility: Secondary | ICD-10-CM | POA: Diagnosis not present
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
## 2019-09-12 ENCOUNTER — Encounter (HOSPITAL_COMMUNITY): Payer: BC Managed Care – PPO

## 2019-09-14 ENCOUNTER — Encounter: Payer: Self-pay | Admitting: Emergency Medicine

## 2019-09-14 ENCOUNTER — Encounter (HOSPITAL_COMMUNITY): Payer: BC Managed Care – PPO

## 2019-09-14 ENCOUNTER — Ambulatory Visit (HOSPITAL_COMMUNITY): Payer: BC Managed Care – PPO | Attending: Neurology | Admitting: Physical Therapy

## 2019-09-14 ENCOUNTER — Encounter: Payer: Self-pay | Admitting: *Deleted

## 2019-09-14 ENCOUNTER — Ambulatory Visit
Admission: EM | Admit: 2019-09-14 | Discharge: 2019-09-14 | Disposition: A | Discharge status: Home / Self Care | Payer: BC Managed Care – PPO | Attending: Family Medicine | Admitting: Family Medicine

## 2019-09-14 ENCOUNTER — Telehealth: Payer: Self-pay | Admitting: Internal Medicine

## 2019-09-14 ENCOUNTER — Other Ambulatory Visit: Payer: Self-pay

## 2019-09-14 ENCOUNTER — Encounter (HOSPITAL_COMMUNITY): Payer: Self-pay | Admitting: Physical Therapy

## 2019-09-14 DIAGNOSIS — G8929 Other chronic pain: Secondary | ICD-10-CM | POA: Insufficient documentation

## 2019-09-14 DIAGNOSIS — R41844 Frontal lobe and executive function deficit: Secondary | ICD-10-CM | POA: Insufficient documentation

## 2019-09-14 DIAGNOSIS — R29898 Other symptoms and signs involving the musculoskeletal system: Secondary | ICD-10-CM | POA: Insufficient documentation

## 2019-09-14 DIAGNOSIS — M25551 Pain in right hip: Secondary | ICD-10-CM | POA: Insufficient documentation

## 2019-09-14 DIAGNOSIS — R41842 Visuospatial deficit: Secondary | ICD-10-CM | POA: Insufficient documentation

## 2019-09-14 DIAGNOSIS — M6281 Muscle weakness (generalized): Secondary | ICD-10-CM | POA: Insufficient documentation

## 2019-09-14 DIAGNOSIS — M542 Cervicalgia: Secondary | ICD-10-CM | POA: Diagnosis not present

## 2019-09-14 DIAGNOSIS — M545 Low back pain, unspecified: Secondary | ICD-10-CM

## 2019-09-14 DIAGNOSIS — M25552 Pain in left hip: Secondary | ICD-10-CM | POA: Diagnosis not present

## 2019-09-14 DIAGNOSIS — R41840 Attention and concentration deficit: Secondary | ICD-10-CM | POA: Diagnosis not present

## 2019-09-14 DIAGNOSIS — Z20822 Contact with and (suspected) exposure to covid-19: Secondary | ICD-10-CM | POA: Insufficient documentation

## 2019-09-14 DIAGNOSIS — R2689 Other abnormalities of gait and mobility: Secondary | ICD-10-CM | POA: Insufficient documentation

## 2019-09-14 NOTE — Therapy (Signed)
[place] [address] [place], [place], [address] Phone: [phone]   Fax:  [phone]  Physical Therapy Treatment  Patient Details  Name: [name] MRN: [id number] Date of Birth: 03-04-1976 Referring Provider (PT): McLean-Scocuzza, Olivia Mackie; Melvenia Beam, MD   Encounter Date: 09/14/2019  PT End of Session - 09/14/19 1133    Visit Number  2    Number of Visits  12    Date for PT Re-Evaluation  10/20/19    Authorization Type  Primary: Medicare Secondary: BCBS Tertiary: Medicaid of [place]    Authorization Time Period  09/08/19- 10/20/19    Authorization - Visit Number  2    Authorization - Number of Visits  10    PT Start Time  1133    PT Stop Time  1215    PT Time Calculation (min)  42 min    Activity Tolerance  Patient limited by pain    Behavior During Therapy  [facility] for tasks assessed/performed       Past Medical History:  Diagnosis Date  . Abdominal pain, epigastric 12/17/2015  . Anxiety    pt denies having dx of anxiety  . BCC (basal cell carcinoma of skin)    right foot   . Bunion, right foot   . Chronic cough   . Closed fibular fracture 10/2008   Left - Sustained 2/2 fall down stairs (same fall as tibular fracture). S/P internal fixation.  . Duodenitis    EGD (07/14/2011) showing chronic duodenitis, H.pyloir neg  . Elevated liver enzymes   . Endometriosis    S/P TAH and salpingo-oophorectomy, right  . Epigastric pain    Chronic, thought 2/2 gastritis, EGD (07/14/2011) showing chronic duodenitis, H.pyloir neg // Repeat EGD (09/2011) - esophagealring, sessible polyp in stomach body, hiatal hernia - rec ad probiotic, await bx  . Esophageal ring    s/p dilatation during EGD (07/2011) - Dr. Oneida Alar  . Fatty liver   . Gastritis    Thought due to chronic NSAID use 2/2 pain from her congenital hip abnormality  . Headache(784.0)    Migraines  . Hidradenitis suppurativa 05/2006  . 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 With chronic OA of hip  . History of migraine headaches    Typical symptoms - bright lights, unilateral (starting behind her left ear), throbbing .  Marland Kitchen Hot flashes   . Hypertension   . Internal hemorrhoids   . Osteoarthritis of hip   . Ovarian cancer ([place]) 1995  . Protrusio acetabuli    diagnosed at age 29  . Sessile colonic polyp    noted 09/2011 -- > rec colonoscopy in 10 years, 2023  . Surgical menopause 08/2007   On HRT. Occuring since 01/09 following TAH and R-salpingo-oophorectomy   . Tibial fracture 10/2008   Left - Sustained 2/2 fall down stairs. Patient is S/P closed internal fixation with Smith Nephew tibial nail locked proximal and distal  . Vitamin D deficiency 11/2009   Vit D level 15 in 11/2009    Past Surgical History:  Procedure Laterality Date  . ABDOMINAL HYSTERECTOMY    . CESAREAN SECTION  08/2005   Primary low transverse  . COLONOSCOPY  09/22/2011   [id number] polyp in the rectum/Internal hemorrhoids, benign path  . ESOPHAGOGASTRODUODENOSCOPY  09/22/2011   [id number], esophageal/Sessile polyp in the body of the stomach/Hiatal hernia/ABDOMINAL PAIN LIKELY FUNCTIONAL V. POST-INFECTIOUS IBS, path: mild chronic gastritis  .  FRACTURE SURGERY     Right hip  . JOINT REPLACEMENT     right hip  . LAPAROSCOPY  10/2000   Operative laparoscopy with lysis of right adnexal adhesions and uterointestinal adhesions  . left tib fib    . OTHER SURGICAL HISTORY  02/2009   Closed treatment internal fixation, left tibia with Tamala Julian and Nephew tibial nail locked proximal and distal  . TOTAL ABDOMINAL HYSTERECTOMY W/ BILATERAL SALPINGOOPHORECTOMY  08/2007   for endometriosis. With concurrent right salpingo-oophorectomy  (2009), left oophorectomy (1995)  . TUBAL LIGATION  08/2005   Right tubal ligation    There were no vitals filed for this visit.  Subjective Assessment - 09/14/19 1137    Subjective   States that she is feeling about the same, 8/10 pain in her neck on the left side. Tried to use a golf ball to mash on the area but it doesn't seem to be going anywhere. Hips are hurting her realy bad 10/10 pain.    Pertinent History  Bilateral THA    Limitations  Sitting;Lifting;Standing;Walking;House hold activities    How long can you sit comfortably?  30 minutes    How long can you stand comfortably?  15 minutes    How long can you walk comfortably?  5 minutes    Patient Stated Goals  main goal is to get better and get back to being active.    Pain Onset  More than a month ago                       [facility] Adult PT Treatment/Exercise - 09/14/19 1301      Neuro Re-ed    Neuro Re-ed Details   guided breathing- focused on 5sec in/out breath. practiced with verbal cues, in sequence with PT and independently -. Body scan with breath work - focus on each body part in supported/ relaxed position and guided imagery for "heavy" "decreased tension".             PT Education - 09/14/19 1254    Education Details  on current presentation, on how breathing/guided imagery/body scan can help with down regulation of ANS. educated patient on CNS and how upregulation increases stress hormone levels, pain and sleep    Person(s) Educated  Patient    Methods  Explanation    Comprehension  Verbalized understanding       PT Short Term Goals - 09/08/19 1323      PT SHORT TERM GOAL #1   Title  Patient will be independant with initial HEP to improve functional outcomes    Time  3    Period  Weeks    Status  Achieved    Target Date  09/29/19      PT SHORT TERM GOAL #2   Title  Patient will report less than 5/10 pain globally, on average, at rest, to improve daily function    Time  3    Period  Weeks    Status  On-going    Target Date  09/29/19        PT Long Term Goals - 09/08/19 1324      PT LONG TERM GOAL #1   Title  Patient will report at least 50% improvement in  cervical symptoms for improved quality of life.    Time  6    Period  Weeks    Status  New    Target Date  10/20/19      PT  LONG TERM GOAL #2   Title  Patient will improve cervical spine ROM in all planes by 50% in order to be able to drive safely.    Time  6    Period  Weeks    Status  New    Target Date  10/20/19      PT LONG TERM GOAL #3   Title  Patient will experience neck/headache pain less than 3/10 in order to improve sleep quality.    Time  6    Period  Weeks    Status  New    Target Date  10/20/19            Plan - 09/14/19 1255    Clinical Impression Statement  Today's session focused on education of breath work and guided imagery as well as practice in clinic. Patient's blood pressure in supine at the start of the session was 149/84 and after breath work and guided imagery her blood pressure was 137/88. In addition to her blood pressure, her pain levels in her back and hips decreased from 10/10 to 5/10 and patient reported she felt signif9icantly more relaxed. Her neck still hurt but educated patient on decreasing accessory muscle use with breathing to allow optimal relaxation. Patient verbalized understanding. Discussed assessment of low back in future sessions as warranted.    Personal Factors and Comorbidities  Comorbidity 2;Past/Current Experience;Time since onset of injury/illness/exacerbation    Comorbidities  Globalized pain in multiple sites    Examination-Activity Limitations  Bed Mobility;Sleep;Bend;Squat;Bathing;Sit;Carry;Stand;Dressing;Transfers;Lift;Locomotion Level;Stairs;Reach Overhead    Examination-Participation Restrictions  Cleaning;Community Activity;Dorita Sciara    Stability/Clinical Decision Making  Evolving/Moderate complexity    Rehab Potential  Fair    PT Frequency  2x / week    PT Duration  6 weeks    PT Treatment/Interventions  ADLs/Self Care Home Management;DME Instruction;Biofeedback;Gait training;Stair training;Cryotherapy;Electrical  Stimulation;Functional mobility training;Therapeutic activities;Moist Heat;Therapeutic exercise;Balance training;Manual techniques;Taping;Wheelchair mobility training;Orthotic Fit/Training;Patient/family education;Energy conservation;Joint Manipulations;Spinal Manipulations;Passive range of motion;Neuromuscular re-education;Aquatic Therapy;Traction;Ultrasound;Splinting;Vestibular    PT Next Visit Plan  Perform FOTO, manual therapy for decreased neck pain and stiffness, access low back and incorporate into POC as warranted by patient presentation (referral includes this area). guided breathing as needed    PT Home Exercise Plan  2/3 deep breathing and body scan    Consulted and Agree with Plan of Care  Patient       Patient will benefit from skilled therapeutic intervention in order to improve the following deficits and impairments:  Abnormal gait, Decreased endurance, Decreased strength, Decreased activity tolerance, Decreased balance, Decreased mobility, Difficulty walking, Pain, Decreased range of motion, Impaired flexibility, Improper body mechanics, Postural dysfunction, Dizziness, Increased fascial restricitons, Increased muscle spasms, Impaired perceived functional ability  Visit Diagnosis: Cervicalgia  Muscle weakness (generalized)  Other symptoms and signs involving the musculoskeletal system  Pain in left hip  Other abnormalities of gait and mobility  Acute left-sided low back pain without sciatica     Problem List Patient Active Problem List   Diagnosis Date Noted  . Concussion with no loss of consciousness 08/12/2018  . Acute pain of left shoulder 08/12/2018  . Low back pain 08/12/2018  . Allergic rhinitis 08/05/2018  . Arthritis 07/01/2018  . DDD (degenerative disc disease), lumbar 05/21/2018  . Thoracic arthritis 05/21/2018  . DDD (degenerative disc disease), cervical 01/06/2018  . Hidradenitis suppurativa 10/11/2017  . Chronic frontal sinusitis 10/11/2017  . Fatty  liver 10/11/2017  . Chronic ethmoidal sinusitis 09/13/2017  . Photophobia of both eyes 09/13/2017  .  Abnormal liver enzymes 09/13/2017  . History of hysterectomy with bilateral oophorectomy 06/24/2017  . Estrogen deficiency 06/24/2017  . Overweight 11/17/2016  . Abdominal pain 11/04/2016  . Headache 12/17/2015  . Chronic constipation 10/12/2012  . Hot flashes 02/20/2012  . Hiatal hernia 02/20/2012  . Chronic pain of both hips 02/20/2012  . Fatigue 12/19/2011  . Acetabulum intrapelvic protrusion into pelvic region or thigh 05/15/2011  . Annual physical exam 10/03/2010  . Hypertension 03/20/2010  . Vitamin D deficiency 08/20/2009  . Anxiety 05/18/2009  . Migraine headache 05/13/2006  1:01 PM, 09/14/19 Jerene Pitch, DPT Physical Therapy with [facility]  [phone] office  [facility] [address] [place], [place], [address] Phone: [phone]   Fax:  [phone]  Name: [name] MRN: [id number] Date of Birth: 1976-04-20

## 2019-09-14 NOTE — Telephone Encounter (Signed)
Patient would like to know when it is time for her to get her covid vaccine, should she get it do to her health issues.
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
## 2019-09-14 NOTE — Telephone Encounter (Signed)
Sent mychart message
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
## 2019-09-14 NOTE — Telephone Encounter (Signed)
COVID-19 Vaccine Information can be found at: [email or web address] For questions related to vaccine distribution or appointments, please email [email or web address] or call [phone].    Have pt read here its not yet time for her age  More info here

## 2019-09-14 NOTE — Discharge Instructions (Signed)
COVID test results available in 24-48 hours - It will release to mychart.  Please call your orthopedists. You need to see him regarding your hip pain/back pain.  Take care  Dr. Lacinda Axon

## 2019-09-15 DIAGNOSIS — M47817 Spondylosis without myelopathy or radiculopathy, lumbosacral region: Secondary | ICD-10-CM | POA: Diagnosis not present

## 2019-09-15 DIAGNOSIS — Z471 Aftercare following joint replacement surgery: Secondary | ICD-10-CM | POA: Diagnosis not present

## 2019-09-15 DIAGNOSIS — M461 Sacroiliitis, not elsewhere classified: Secondary | ICD-10-CM | POA: Diagnosis not present

## 2019-09-15 DIAGNOSIS — M8588 Other specified disorders of bone density and structure, other site: Secondary | ICD-10-CM | POA: Diagnosis not present

## 2019-09-15 DIAGNOSIS — M5136 Other intervertebral disc degeneration, lumbar region: Secondary | ICD-10-CM | POA: Diagnosis not present

## 2019-09-15 DIAGNOSIS — Z96642 Presence of left artificial hip joint: Secondary | ICD-10-CM | POA: Diagnosis not present

## 2019-09-15 DIAGNOSIS — M47816 Spondylosis without myelopathy or radiculopathy, lumbar region: Secondary | ICD-10-CM | POA: Diagnosis not present

## 2019-09-15 DIAGNOSIS — M48061 Spinal stenosis, lumbar region without neurogenic claudication: Secondary | ICD-10-CM | POA: Diagnosis not present

## 2019-09-15 LAB — NOVEL CORONAVIRUS, NAA (HOSP ORDER, SEND-OUT TO REF LAB; TAT 18-24 HRS): SARS-CoV-2, NAA: NOT DETECTED

## 2019-09-16 ENCOUNTER — Other Ambulatory Visit: Payer: Self-pay

## 2019-09-16 ENCOUNTER — Encounter (HOSPITAL_COMMUNITY): Payer: Self-pay

## 2019-09-16 ENCOUNTER — Ambulatory Visit (HOSPITAL_COMMUNITY): Payer: BC Managed Care – PPO

## 2019-09-16 ENCOUNTER — Telehealth (HOSPITAL_COMMUNITY): Payer: Self-pay | Admitting: Physical Therapy

## 2019-09-16 DIAGNOSIS — M25551 Pain in right hip: Secondary | ICD-10-CM | POA: Diagnosis not present

## 2019-09-16 DIAGNOSIS — R41840 Attention and concentration deficit: Secondary | ICD-10-CM | POA: Diagnosis not present

## 2019-09-16 DIAGNOSIS — M542 Cervicalgia: Secondary | ICD-10-CM

## 2019-09-16 DIAGNOSIS — R41844 Frontal lobe and executive function deficit: Secondary | ICD-10-CM | POA: Diagnosis not present

## 2019-09-16 DIAGNOSIS — M25552 Pain in left hip: Secondary | ICD-10-CM

## 2019-09-16 DIAGNOSIS — M545 Low back pain, unspecified: Secondary | ICD-10-CM

## 2019-09-16 DIAGNOSIS — R2689 Other abnormalities of gait and mobility: Secondary | ICD-10-CM | POA: Diagnosis not present

## 2019-09-16 DIAGNOSIS — M6281 Muscle weakness (generalized): Secondary | ICD-10-CM

## 2019-09-16 DIAGNOSIS — R41842 Visuospatial deficit: Secondary | ICD-10-CM

## 2019-09-16 DIAGNOSIS — R29898 Other symptoms and signs involving the musculoskeletal system: Secondary | ICD-10-CM

## 2019-09-16 NOTE — Therapy (Signed)
[facility] [address] [place], [place], [address] Phone: [phone]   Fax:  [phone]  Physical Therapy Treatment  Patient Details  Name: [name] MRN: [id number] Date of Birth: [birth year] Referring Provider (PT): McLean-Scocuzza, Olivia Mackie; Melvenia Beam, MD   Encounter Date: 09/16/2019  PT End of Session - 09/16/19 1033    Visit Number  3    Number of Visits  12    Date for PT Re-Evaluation  10/20/19    Authorization Type  Primary: Medicare Secondary: BCBS Tertiary: Medicaid of [place]    Authorization Time Period  09/08/19- 10/20/19    Authorization - Visit Number  3    Authorization - Number of Visits  10    PT Start Time  [id number]    PT Stop Time  1115    PT Time Calculation (min)  40 min    Activity Tolerance  Patient limited by pain    Behavior During Therapy  [facility] for tasks assessed/performed       Past Medical History:  Diagnosis Date  . Abdominal pain, epigastric 12/17/2015  . Anxiety    pt denies having dx of anxiety  . BCC (basal cell carcinoma of skin)    right foot   . Bunion, right foot   . Chronic cough   . Closed fibular fracture 10/2008   Left - Sustained 2/2 fall down stairs (same fall as tibular fracture). S/P internal fixation.  . Duodenitis    EGD (07/14/2011) showing chronic duodenitis, H.pyloir neg  . Elevated liver enzymes   . Endometriosis    S/P TAH and salpingo-oophorectomy, right  . Epigastric pain    Chronic, thought 2/2 gastritis, EGD (07/14/2011) showing chronic duodenitis, H.pyloir neg // Repeat EGD (09/2011) - esophagealring, sessible polyp in stomach body, hiatal hernia - rec ad probiotic, await bx  . Esophageal ring    s/p dilatation during EGD (07/2011) - Dr. Oneida Alar  . Fatty liver   . Gastritis    Thought due to chronic NSAID use 2/2 pain from her congenital hip abnormality  . Headache(784.0)    Migraines  . Hidradenitis suppurativa 05/2006  . 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 With chronic OA of hip  . History of migraine headaches    Typical symptoms - bright lights, unilateral (starting behind her left ear), throbbing .  Marland Kitchen Hot flashes   . Hypertension   . Internal hemorrhoids   . Osteoarthritis of hip   . Ovarian cancer ([place]) 1995  . Protrusio acetabuli    diagnosed at age 47  . Sessile colonic polyp    noted 09/2011 -- > rec colonoscopy in 10 years, 2023  . Surgical menopause 08/2007   On HRT. Occuring since 01/09 following TAH and R-salpingo-oophorectomy   . Tibial fracture 10/2008   Left - Sustained 2/2 fall down stairs. Patient is S/P closed internal fixation with Smith Nephew tibial nail locked proximal and distal  . Vitamin D deficiency 11/2009   Vit D level 15 in 11/2009    Past Surgical History:  Procedure Laterality Date  . ABDOMINAL HYSTERECTOMY    . CESAREAN SECTION  08/2005   Primary low transverse  . COLONOSCOPY  09/22/2011   [id number] polyp in the rectum/Internal hemorrhoids, benign path  . ESOPHAGOGASTRODUODENOSCOPY  09/22/2011   [id number], esophageal/Sessile polyp in the body of the stomach/Hiatal hernia/ABDOMINAL PAIN LIKELY FUNCTIONAL V. POST-INFECTIOUS IBS, path: mild chronic gastritis  .  FRACTURE SURGERY     Right hip  . JOINT REPLACEMENT     right hip  . LAPAROSCOPY  10/2000   Operative laparoscopy with lysis of right adnexal adhesions and uterointestinal adhesions  . left tib fib    . OTHER SURGICAL HISTORY  02/2009   Closed treatment internal fixation, left tibia with Tamala Julian and Nephew tibial nail locked proximal and distal  . TOTAL ABDOMINAL HYSTERECTOMY W/ BILATERAL SALPINGOOPHORECTOMY  08/2007   for endometriosis. With concurrent right salpingo-oophorectomy  (2009), left oophorectomy (1995)  . TUBAL LIGATION  08/2005   Right tubal ligation    There were no vitals filed for this visit.  Subjective Assessment - 09/16/19 1036    Subjective  Pt  reports L hip pain is persistent, 8/10 and some pain in her neck today as well (not rated). Pt reports exercises at home are going about the same. Pt reports last session breathing helped a lot.    Pertinent History  Bilateral THA    Limitations  Sitting;Lifting;Standing;Walking;House hold activities    How long can you sit comfortably?  30 minutes    How long can you stand comfortably?  15 minutes    How long can you walk comfortably?  5 minutes    Patient Stated Goals  main goal is to get better and get back to being active.    Currently in Pain?  Yes    Pain Score  8     Pain Location  Hip    Pain Orientation  Left    Pain Descriptors / Indicators  Aching    Pain Type  Acute pain    Pain Onset  More than a month ago    Pain Frequency  Constant    Aggravating Factors   lying flat, sitting    Pain Relieving Factors  massage    Effect of Pain on Daily Activities  limits    Multiple Pain Sites  Yes    Pain Score  --   not rated 0-10   Pain Location  Neck    Pain Orientation  Left    Pain Descriptors / Indicators  Tightness    Pain Type  Acute pain    Pain Onset  More than a month ago    Pain Frequency  Constant    Aggravating Factors   lying flat    Pain Relieving Factors  massage, rest    Effect of Pain on Daily Activities  limits         [facility] PT Assessment - 09/16/19 0001      Observation/Other Assessments   Focus on Therapeutic Outcomes (FOTO)   70% limited            [facility] Adult PT Treatment/Exercise - 09/16/19 0001      Neuro Re-ed    Neuro Re-ed Details   guided breathing 5sec inhale/exhale, practiced with verbal cues, in sequence with PT and independently, BLE elevated on pillow to reduce LBP; guided imagery for "decreased tension"      Neck Exercises: Supine   Neck Retraction  20 reps;3 secs    Neck Retraction Limitations  with exhale      Lumbar Exercises: Supine   Ab Set  20 reps;3 seconds    AB Set Limitations  with exhale    Glut Set  20 reps;3  seconds    Glut Set Limitations  with exhale             PT Education - 09/16/19  [id number]    Education Details  FOTO findings, continue HEP, exercise technique, decreasing BUE weight-bearing on RW to decrease tension in neck with ambulation    Person(s) Educated  Patient    Methods  Explanation;Demonstration;Tactile cues;Verbal cues    Comprehension  Verbalized understanding;Returned demonstration;Verbal cues required;Tactile cues required       PT Short Term Goals - 09/08/19 1323      PT SHORT TERM GOAL #1   Title  Patient will be independant with initial HEP to improve functional outcomes    Time  3    Period  Weeks    Status  Achieved    Target Date  09/29/19      PT SHORT TERM GOAL #2   Title  Patient will report less than 5/10 pain globally, on average, at rest, to improve daily function    Time  3    Period  Weeks    Status  On-going    Target Date  09/29/19        PT Long Term Goals - 09/08/19 1324      PT LONG TERM GOAL #1   Title  Patient will report at least 50% improvement in cervical symptoms for improved quality of life.    Time  6    Period  Weeks    Status  New    Target Date  10/20/19      PT LONG TERM GOAL #2   Title  Patient will improve cervical spine ROM in all planes by 50% in order to be able to drive safely.    Time  6    Period  Weeks    Status  New    Target Date  10/20/19      PT LONG TERM GOAL #3   Title  Patient will experience neck/headache pain less than 3/10 in order to improve sleep quality.    Time  6    Period  Weeks    Status  New    Target Date  10/20/19            Plan - 09/16/19 1034    Clinical Impression Statement  Began treatment session with FOTO and reviewed score with pt. Continued with guided breathing and imagery in supported lying position to decrease tension and improve pain. Pt continues to require constant verbal cues for relaxation of shoulders throughout treatment with noticeable shoulder elevation  and increased tension during session. Added isometric glute, abdominal and neck retraction exercises this date throughout guided breathing to activate different muscles while maintaining body relaxed to reduce pain and work on muscle endurance as well as mind-body connection. Pt reports lower half of body feeling "much better" at EOS, but no improvement in neck pain. Pt ambulates out of clinic with heavy BUE weight-bearing on RW, discussed decreasing pushing onto RW to decrease tension in shoulders with ambulation and pt agreed in noticing tension in neck with ambulation but unable to decrease. Continue to progress as able.    Personal Factors and Comorbidities  Comorbidity 2;Past/Current Experience;Time since onset of injury/illness/exacerbation    Comorbidities  Globalized pain in multiple sites    Examination-Activity Limitations  Bed Mobility;Sleep;Bend;Squat;Bathing;Sit;Carry;Stand;Dressing;Transfers;Lift;Locomotion Level;Stairs;Reach Overhead    Examination-Participation Restrictions  Cleaning;Community Activity;[facility] Le[facility]    Stability/Clinical Decision Making  Evolving/Moderate complexity    Rehab Potential  Fair    PT Frequency  2x / week    PT Duration  6 weeks    PT Treatment/Interventions  ADLs/Self Care Home Management;DME  Instruction;Biofeedback;Gait training;Stair training;Cryotherapy;Electrical Stimulation;Functional mobility training;Therapeutic activities;Moist Heat;Therapeutic exercise;Balance training;Manual techniques;Taping;Wheelchair mobility training;Orthotic Fit/Training;Patient/family education;Energy conservation;Joint Manipulations;Spinal Manipulations;Passive range of motion;Neuromuscular re-education;Aquatic Therapy;Traction;Ultrasound;Splinting;Vestibular    PT Next Visit Plan  Continue isometric contractions for pain reduction and strengthening. Manual therapy for decreased neck pain and stiffness, assess low back and incorporate into POC as warranted by patient  presentation (referral includes this area). guided breathing as needed    PT Home Exercise Plan  2/3 deep breathing and body scan    Consulted and Agree with Plan of Care  Patient       Patient will benefit from skilled therapeutic intervention in order to improve the following deficits and impairments:  Abnormal gait, Decreased endurance, Decreased strength, Decreased activity tolerance, Decreased balance, Decreased mobility, Difficulty walking, Pain, Decreased range of motion, Impaired flexibility, Improper body mechanics, Postural dysfunction, Dizziness, Increased fascial restricitons, Increased muscle spasms, Impaired perceived functional ability  Visit Diagnosis: Cervicalgia  Muscle weakness (generalized)  Other symptoms and signs involving the musculoskeletal system  Pain in left hip  Other abnormalities of gait and mobility  Acute left-sided low back pain without sciatica     Problem List Patient Active Problem List   Diagnosis Date Noted  . Concussion with no loss of consciousness 08/12/2018  . Acute pain of left shoulder 08/12/2018  . Low back pain 08/12/2018  . Allergic rhinitis 08/05/2018  . Arthritis 07/01/2018  . DDD (degenerative disc disease), lumbar 05/21/2018  . Thoracic arthritis 05/21/2018  . DDD (degenerative disc disease), cervical 01/06/2018  . Hidradenitis suppurativa 10/11/2017  . Chronic frontal sinusitis 10/11/2017  . Fatty liver 10/11/2017  . Chronic ethmoidal sinusitis 09/13/2017  . Photophobia of both eyes 09/13/2017  . Abnormal liver enzymes 09/13/2017  . History of hysterectomy with bilateral oophorectomy 06/24/2017  . Estrogen deficiency 06/24/2017  . Overweight 11/17/2016  . Abdominal pain 11/04/2016  . Headache 12/17/2015  . Chronic constipation 10/12/2012  . Hot flashes 02/20/2012  . Hiatal hernia 02/20/2012  . Chronic pain of both hips 02/20/2012  . Fatigue 12/19/2011  . Acetabulum intrapelvic protrusion into pelvic region or  thigh 05/15/2011  . Annual physical exam 10/03/2010  . Hypertension 03/20/2010  . Vitamin D deficiency 08/20/2009  . Anxiety 05/18/2009  . Migraine headache 05/13/2006     Talbot Grumbling PT, DPT 09/16/19, 11:27 AM [facility] [place], [place], [address] Phone: [phone]   Fax:  [phone]  Name: [name] MRN: [id number] Date of Birth: 03-05-1976

## 2019-09-16 NOTE — Telephone Encounter (Signed)
pt called to cancel this appt due to she has a conflicting appt on the same day

## 2019-09-17 NOTE — Therapy (Signed)
[place], [place], [address] Phone: [phone]   Fax:  [phone]  Occupational Therapy Treatment  Patient Details  Name: [name] MRN: [id number] Date of Birth: [birth year] No data recorded  Encounter Date: 09/16/2019  OT End of Session - 09/17/19 1837    Visit Number  2    Number of Visits  6    Date for OT Re-Evaluation  10/21/19   mini reassessment 10/07/19   Authorization Type  1) BCBS 2) Medicare A & B 3) Medicaid    Authorization Time Period  progress note before 10th visit    Authorization - Visit Number  2    Authorization - Number of Visits  10    OT Start Time  [phone]   pt arrived late   OT Stop Time  1035    OT Time Calculation (min)  40 min    Activity Tolerance  Patient tolerated treatment well    Behavior During Therapy  [facility] for tasks assessed/performed       Past Medical History:  Diagnosis Date  . Abdominal pain, epigastric 12/17/2015  . Anxiety    pt denies having dx of anxiety  . BCC (basal cell carcinoma of skin)    right foot   . Bunion, right foot   . Chronic cough   . Closed fibular fracture 10/2008   Left - Sustained 2/2 fall down stairs (same fall as tibular fracture). S/P internal fixation.  . Duodenitis    EGD (07/14/2011) showing chronic duodenitis, H.pyloir neg  . Elevated liver enzymes   . Endometriosis    S/P TAH and salpingo-oophorectomy, right  . Epigastric pain    Chronic, thought 2/2 gastritis, EGD (07/14/2011) showing chronic duodenitis, H.pyloir neg // Repeat EGD (09/2011) - esophagealring, sessible polyp in stomach body, hiatal hernia - rec ad probiotic, await bx  . Esophageal ring    s/p dilatation during EGD (07/2011) - Dr. Oneida Alar  . Fatty liver   . Gastritis    Thought due to chronic NSAID use 2/2 pain from her congenital hip abnormality  . Headache(784.0)    Migraines  . Hidradenitis suppurativa 05/2006  . Hip deformity, congenital    Protrusio  acetabuli with articular sclerosis and flattening of femoral heads. With chronic OA of hip  . History of migraine headaches    Typical symptoms - bright lights, unilateral (starting behind her left ear), throbbing .  Marland Kitchen Hot flashes   . Hypertension   . Internal hemorrhoids   . Osteoarthritis of hip   . Ovarian cancer ([place]) 1995  . Protrusio acetabuli    diagnosed at age 2  . Sessile colonic polyp    noted 09/2011 -- > rec colonoscopy in 10 years, 2023  . Surgical menopause 08/2007   On HRT. Occuring since 01/09 following TAH and R-salpingo-oophorectomy   . Tibial fracture 10/2008   Left - Sustained 2/2 fall down stairs. Patient is S/P closed internal fixation with Smith Nephew tibial nail locked proximal and distal  . Vitamin D deficiency 11/2009   Vit D level 15 in 11/2009    Past Surgical History:  Procedure Laterality Date  . ABDOMINAL HYSTERECTOMY    . CESAREAN SECTION  08/2005   Primary low transverse  . COLONOSCOPY  09/22/2011   [id number] polyp in the rectum/Internal hemorrhoids, benign path  . ESOPHAGOGASTRODUODENOSCOPY  09/22/2011   [id number], esophageal/Sessile polyp in the body of the stomach/Hiatal hernia/ABDOMINAL PAIN LIKELY FUNCTIONAL V. POST-INFECTIOUS  IBS, path: mild chronic gastritis  . FRACTURE SURGERY     Right hip  . JOINT REPLACEMENT     right hip  . LAPAROSCOPY  10/2000   Operative laparoscopy with lysis of right adnexal adhesions and uterointestinal adhesions  . left tib fib    . OTHER SURGICAL HISTORY  02/2009   Closed treatment internal fixation, left tibia with Tamala Julian and Nephew tibial nail locked proximal and distal  . TOTAL ABDOMINAL HYSTERECTOMY W/ BILATERAL SALPINGOOPHORECTOMY  08/2007   for endometriosis. With concurrent right salpingo-oophorectomy  (2009), left oophorectomy (1995)  . TUBAL LIGATION  08/2005   Right tubal ligation    There were no vitals filed for this visit.         09/16/19 1058  Assessment  Medical Diagnosis Post  Concussion Syndrome  Precautions  Precautions Fall      09/16/19 1059  ADLs  ADL Comments Discussed Sleep Hygeine. Patient filled out Sleep Hygeine questionnaire. Reviewed handout together to discuss importance of sleep, problems that occur for patient when they do not get enough sleep, strategies/techniques that help promote good sleep hygeine.                 09/16/19 1001  Symptoms/Limitations  Subjective  S: I didn't know know that I had two appointments today. I'm not getting the calls.  Pain Assessment  Currently in Pain? No/denies        OT Education - 09/17/19 1833    Education Details  Pt brought in list of cleaning activities she typically completes (media tab). Discussed sleep hygiene, the importance, techniques/strategies. Pt provided with handouts for additional reading. Provided with Sleep routine chart to fill out for next session. Patient was able to verbalize understanding of education and comment on any strategies that she has been doing already such as not drinking caffeine or alcohol.    Person(s) Educated  Patient    Methods  Explanation;Handout    Comprehension  Verbalized understanding       OT Short Term Goals - 09/16/19 1001      OT SHORT TERM GOAL #1   Title  Pt will report improved sleep by successfully utilizing sleep hygiene education and routine developed during therapy.    Time  6    Period  Weeks    Status  On-going    Target Date  10/21/19      OT SHORT TERM GOAL #2   Title  Pt will be report decreased stress and anxiety levels by verbalizing at least 3 sucessful stress management techniques utilized during times of increased stress for calm and focus.    Time  6    Period  Weeks    Status  On-going      OT SHORT TERM GOAL #3   Title  Pt will improve ADL and I/ADL performance by reporting use of at least 2 cognitive strategies for planning and time management.    Time  6    Period  Weeks    Status  On-going      OT SHORT  TERM GOAL #4   Title  Pt will be educated on and verbalize use of at least 2 energy conservation techniques to increase energy and decrease fatigue during the day.    Time  6    Period  Weeks    Status  On-going      OT SHORT TERM GOAL #5   Title  Pt will report improvement in ability to engage in  family movie and game nights by utilizing utilizing sensory processing strategies to decrease "brain jitters" and anxiety/overwhelmed feeling.    Time  6    Period  Weeks    Status  On-going      OT SHORT TERM GOAL #6   Title  Pt will improve ability to fix a simple meal or bake preferred items by utilizing compensatory strategies for short term memory deficits.    Time  6    Period  Weeks    Status  On-going      OT SHORT TERM GOAL #7   Title  Pt will be educated on and utilize compensatory strategies for visual impairments (including visual-perceptual issues) to improve ability to read.    Time  6    Period  Weeks    Status  On-going               Plan - 09/17/19 1851    Clinical Impression Statement  A: Session reviewed goals and discussed treatment plan. Patient arrived late as she reported that she did not remember she had two appointments. Phone number was updated to help with making appointments. Schedule was also printed in a calendar format and OT appointments were highlighted.    Occupational performance deficits (Please refer to evaluation for details):  ADL's;IADL's;Rest and Sleep;Leisure;Social Participation    Plan  P: Follow up on homework: Sleep routine sheet. Educate on Relaxation Techniques, Foods that help you sleep, Putting it all together. Fill out sleep routine worksheet.    Consulted and Agree with Plan of Care  Patient       Patient will benefit from skilled therapeutic intervention in order to improve the following deficits and impairments:    cognitive skills, psychosocial skills       Visit Diagnosis: Attention and concentration deficit  Frontal  lobe and executive function deficit  Visuospatial deficit    Problem List Patient Active Problem List   Diagnosis Date Noted  . Concussion with no loss of consciousness 08/12/2018  . Acute pain of left shoulder 08/12/2018  . Low back pain 08/12/2018  . Allergic rhinitis 08/05/2018  . Arthritis 07/01/2018  . DDD (degenerative disc disease), lumbar 05/21/2018  . Thoracic arthritis 05/21/2018  . DDD (degenerative disc disease), cervical 01/06/2018  . Hidradenitis suppurativa 10/11/2017  . Chronic frontal sinusitis 10/11/2017  . Fatty liver 10/11/2017  . Chronic ethmoidal sinusitis 09/13/2017  . Photophobia of both eyes 09/13/2017  . Abnormal liver enzymes 09/13/2017  . History of hysterectomy with bilateral oophorectomy 06/24/2017  . Estrogen deficiency 06/24/2017  . Overweight 11/17/2016  . Abdominal pain 11/04/2016  . Headache 12/17/2015  . Chronic constipation 10/12/2012  . Hot flashes 02/20/2012  . Hiatal hernia 02/20/2012  . Chronic pain of both hips 02/20/2012  . Fatigue 12/19/2011  . Acetabulum intrapelvic protrusion into pelvic region or thigh 05/15/2011  . Annual physical exam 10/03/2010  . Hypertension 03/20/2010  . Vitamin D deficiency 08/20/2009  . Anxiety 05/18/2009  . Migraine headache 05/13/2006     Ailene Ravel, OTR/L,CBIS  [phone]  09/17/2019, 7:11 PM  [place] [address] [place], [place], [address] Phone: [phone]   Fax:  [phone]  Name: [name] MRN: [id number] Date of Birth: [birth year]

## 2019-09-19 ENCOUNTER — Ambulatory Visit (HOSPITAL_COMMUNITY): Payer: BC Managed Care – PPO | Admitting: Physical Therapy

## 2019-09-19 DIAGNOSIS — K5900 Constipation, unspecified: Secondary | ICD-10-CM | POA: Diagnosis not present

## 2019-09-19 DIAGNOSIS — K581 Irritable bowel syndrome with constipation: Secondary | ICD-10-CM | POA: Diagnosis not present

## 2019-09-19 DIAGNOSIS — R109 Unspecified abdominal pain: Secondary | ICD-10-CM | POA: Diagnosis not present

## 2019-09-19 DIAGNOSIS — R140 Abdominal distension (gaseous): Secondary | ICD-10-CM | POA: Diagnosis not present

## 2019-09-21 ENCOUNTER — Encounter (HOSPITAL_COMMUNITY): Payer: Self-pay | Admitting: Occupational Therapy

## 2019-09-21 ENCOUNTER — Ambulatory Visit (HOSPITAL_COMMUNITY): Payer: BC Managed Care – PPO

## 2019-09-21 ENCOUNTER — Other Ambulatory Visit: Payer: Self-pay

## 2019-09-21 ENCOUNTER — Ambulatory Visit (HOSPITAL_COMMUNITY): Payer: BC Managed Care – PPO | Admitting: Occupational Therapy

## 2019-09-21 ENCOUNTER — Encounter (HOSPITAL_COMMUNITY): Payer: Self-pay

## 2019-09-21 DIAGNOSIS — R41842 Visuospatial deficit: Secondary | ICD-10-CM | POA: Diagnosis not present

## 2019-09-21 DIAGNOSIS — R41840 Attention and concentration deficit: Secondary | ICD-10-CM

## 2019-09-21 DIAGNOSIS — R2689 Other abnormalities of gait and mobility: Secondary | ICD-10-CM | POA: Diagnosis not present

## 2019-09-21 DIAGNOSIS — M542 Cervicalgia: Secondary | ICD-10-CM | POA: Diagnosis not present

## 2019-09-21 DIAGNOSIS — M6281 Muscle weakness (generalized): Secondary | ICD-10-CM | POA: Diagnosis not present

## 2019-09-21 DIAGNOSIS — M25552 Pain in left hip: Secondary | ICD-10-CM | POA: Diagnosis not present

## 2019-09-21 DIAGNOSIS — R41844 Frontal lobe and executive function deficit: Secondary | ICD-10-CM

## 2019-09-21 DIAGNOSIS — M545 Low back pain, unspecified: Secondary | ICD-10-CM

## 2019-09-21 DIAGNOSIS — R29898 Other symptoms and signs involving the musculoskeletal system: Secondary | ICD-10-CM | POA: Diagnosis not present

## 2019-09-21 DIAGNOSIS — M25551 Pain in right hip: Secondary | ICD-10-CM | POA: Diagnosis not present

## 2019-09-21 NOTE — Therapy (Signed)
[place], [place], [address] Phone: [phone]   Fax:  [phone]  Occupational Therapy Treatment  Patient Details  Name: [name] MRN: [id number] Date of Birth: Mar 19, 1976 No data recorded  Encounter Date: 09/21/2019  OT End of Session - 09/21/19 1417    Visit Number  3    Number of Visits  6    Date for OT Re-Evaluation  10/21/19   mini reassessment 10/07/19   Authorization Type  1) BCBS 2) Medicare A & B 3) Medicaid    Authorization Time Period  progress note before 10th visit    Authorization - Visit Number  3    Authorization - Number of Visits  10    OT Start Time  1115    OT Stop Time  1155    OT Time Calculation (min)  40 min    Activity Tolerance  Patient tolerated treatment well    Behavior During Therapy  [facility] for tasks assessed/performed       Past Medical History:  Diagnosis Date  . Abdominal pain, epigastric 12/17/2015  . Anxiety    pt denies having dx of anxiety  . BCC (basal cell carcinoma of skin)    right foot   . Bunion, right foot   . Chronic cough   . Closed fibular fracture 10/2008   Left - Sustained 2/2 fall down stairs (same fall as tibular fracture). S/P internal fixation.  . Duodenitis    EGD (07/14/2011) showing chronic duodenitis, H.pyloir neg  . Elevated liver enzymes   . Endometriosis    S/P TAH and salpingo-oophorectomy, right  . Epigastric pain    Chronic, thought 2/2 gastritis, EGD (07/14/2011) showing chronic duodenitis, H.pyloir neg // Repeat EGD (09/2011) - esophagealring, sessible polyp in stomach body, hiatal hernia - rec ad probiotic, await bx  . Esophageal ring    s/p dilatation during EGD (07/2011) - Dr. Oneida Alar  . Fatty liver   . Gastritis    Thought due to chronic NSAID use 2/2 pain from her congenital hip abnormality  . Headache(784.0)    Migraines  . Hidradenitis suppurativa 05/2006  . Hip deformity, congenital    Protrusio acetabuli with  articular sclerosis and flattening of femoral heads. With chronic OA of hip  . History of migraine headaches    Typical symptoms - bright lights, unilateral (starting behind her left ear), throbbing .  Marland Kitchen Hot flashes   . Hypertension   . Internal hemorrhoids   . Osteoarthritis of hip   . Ovarian cancer ([place]) 1995  . Protrusio acetabuli    diagnosed at age 88  . Sessile colonic polyp    noted 09/2011 -- > rec colonoscopy in 10 years, 2023  . Surgical menopause 08/2007   On HRT. Occuring since 01/09 following TAH and R-salpingo-oophorectomy   . Tibial fracture 10/2008   Left - Sustained 2/2 fall down stairs. Patient is S/P closed internal fixation with Smith Nephew tibial nail locked proximal and distal  . Vitamin D deficiency 11/2009   Vit D level 15 in 11/2009    Past Surgical History:  Procedure Laterality Date  . ABDOMINAL HYSTERECTOMY    . CESAREAN SECTION  08/2005   Primary low transverse  . COLONOSCOPY  09/22/2011   [id number] polyp in the rectum/Internal hemorrhoids, benign path  . ESOPHAGOGASTRODUODENOSCOPY  09/22/2011   [id number], esophageal/Sessile polyp in the body of the stomach/Hiatal hernia/ABDOMINAL PAIN LIKELY FUNCTIONAL V. POST-INFECTIOUS IBS, path: mild chronic  gastritis  . FRACTURE SURGERY     Right hip  . JOINT REPLACEMENT     right hip  . LAPAROSCOPY  10/2000   Operative laparoscopy with lysis of right adnexal adhesions and uterointestinal adhesions  . left tib fib    . OTHER SURGICAL HISTORY  02/2009   Closed treatment internal fixation, left tibia with Tamala Julian and Nephew tibial nail locked proximal and distal  . TOTAL ABDOMINAL HYSTERECTOMY W/ BILATERAL SALPINGOOPHORECTOMY  08/2007   for endometriosis. With concurrent right salpingo-oophorectomy  (2009), left oophorectomy (1995)  . TUBAL LIGATION  08/2005   Right tubal ligation    There were no vitals filed for this visit.  Subjective Assessment - 09/21/19 1110    Subjective   S: I started the chart on  Saturday.    Currently in Pain?  Yes    Pain Score  8     Pain Location  Back   hip   Pain Orientation  Left    Pain Descriptors / Indicators  Nagging    Pain Type  Acute pain    Pain Radiating Towards  leg    Pain Onset  More than a month ago    Pain Frequency  Constant    Aggravating Factors   laying flat, sitting    Pain Relieving Factors  massage, heat    Effect of Pain on Daily Activities  mod to max effect on ADLs    Multiple Pain Sites  No         [facility] OT Assessment - 09/21/19 1109      Assessment   Medical Diagnosis  Post Concussion Syndrome      Precautions   Precautions  Fall               OT Treatments/Exercises (OP) - 09/21/19 1118      ADLs   ADL Comments  Continued with sleep hygiene techniques and strategies today. Pt reports she slept all night Saturday but woke up a lot last night. She is currently filling out the sleep chart. Discussed relaxation techniques including progressive muscle relaxation and sleep stories. Trialed PMR app during session completing 5 minutes focusing on limbs and 6 minutes focusing on core. Also discussed foods that promote and inhibit sleep and provided handout. Encouraged pt to trial PMR, deep breathing, and sleep stories this week. Also provided sleep routine worksheet for pt to fill out during the week for what works for her and does not work for her.                OT Short Term Goals - 09/16/19 1001      OT SHORT TERM GOAL #1   Title  Pt will report improved sleep by successfully utilizing sleep hygiene education and routine developed during therapy.    Time  6    Period  Weeks    Status  On-going    Target Date  10/21/19      OT SHORT TERM GOAL #2   Title  Pt will be report decreased stress and anxiety levels by verbalizing at least 3 sucessful stress management techniques utilized during times of increased stress for calm and focus.    Time  6    Period  Weeks    Status  On-going      OT SHORT TERM  GOAL #3   Title  Pt will improve ADL and I/ADL performance by reporting use of at least 2 cognitive strategies for planning and time  management.    Time  6    Period  Weeks    Status  On-going      OT SHORT TERM GOAL #4   Title  Pt will be educated on and verbalize use of at least 2 energy conservation techniques to increase energy and decrease fatigue during the day.    Time  6    Period  Weeks    Status  On-going      OT SHORT TERM GOAL #5   Title  Pt will report improvement in ability to engage in family movie and game nights by utilizing utilizing sensory processing strategies to decrease "brain jitters" and anxiety/overwhelmed feeling.    Time  6    Period  Weeks    Status  On-going      OT SHORT TERM GOAL #6   Title  Pt will improve ability to fix a simple meal or bake preferred items by utilizing compensatory strategies for short term memory deficits.    Time  6    Period  Weeks    Status  On-going      OT SHORT TERM GOAL #7   Title  Pt will be educated on and utilize compensatory strategies for visual impairments (including visual-perceptual issues) to improve ability to read.    Time  6    Period  Weeks    Status  On-going               Plan - 09/21/19 1417    Clinical Impression Statement  A: Completed sleep hygiene work today, pt reports she is working on the sleep chart at home from Friday. Continued with sleep hygiene education and trialed PMR this session. Pt did well with PMR and was relaxed during task. Discussed strategies to put in her sleep routine, including getting up when unable to sleep and reading or doing something relaxing. Also discussed writing thoughts down in a journal when waking up worried. Pt verbalized understanding for all education.    Cognitive Skills  Attention;Thought;Emotional;Energy/Drive;Memory;Problem Solve;Safety Awareness;Sequencing    Psychosocial Skills  Coping Strategies;Environmental  Adaptations;Habits;Interpersonal  Interaction;Routines and Behaviors    Plan  P: Follow up on homework-sleep routine sheet and trial of sleep stories or PMR. Begin working on stress management strategies and techniques       Patient will benefit from skilled therapeutic intervention in order to improve the following deficits and impairments:     Cognitive Skills: Attention, Thought, Emotional, Energy/Drive, Memory, Problem Solve, Safety Awareness, Sequencing Psychosocial Skills: Coping Strategies, Environmental  Adaptations, Habits, Interpersonal Interaction, Routines and Behaviors   Visit Diagnosis: Frontal lobe and executive function deficit  Attention and concentration deficit  Visuospatial deficit    Problem List Patient Active Problem List   Diagnosis Date Noted  . Concussion with no loss of consciousness 08/12/2018  . Acute pain of left shoulder 08/12/2018  . Low back pain 08/12/2018  . Allergic rhinitis 08/05/2018  . Arthritis 07/01/2018  . DDD (degenerative disc disease), lumbar 05/21/2018  . Thoracic arthritis 05/21/2018  . DDD (degenerative disc disease), cervical 01/06/2018  . Hidradenitis suppurativa 10/11/2017  . Chronic frontal sinusitis 10/11/2017  . Fatty liver 10/11/2017  . Chronic ethmoidal sinusitis 09/13/2017  . Photophobia of both eyes 09/13/2017  . Abnormal liver enzymes 09/13/2017  . History of hysterectomy with bilateral oophorectomy 06/24/2017  . Estrogen deficiency 06/24/2017  . Overweight 11/17/2016  . Abdominal pain 11/04/2016  . Headache 12/17/2015  . Chronic constipation 10/12/2012  . Hot flashes  02/20/2012  . Hiatal hernia 02/20/2012  . Chronic pain of both hips 02/20/2012  . Fatigue 12/19/2011  . Acetabulum intrapelvic protrusion into pelvic region or thigh 05/15/2011  . Annual physical exam 10/03/2010  . Hypertension 03/20/2010  . Vitamin D deficiency 08/20/2009  . Anxiety 05/18/2009  . Migraine headache 05/13/2006   Guadelupe Sabin, OTR/L   [phone] 09/21/2019, 4:04 PM  [place] [address] [place], [place], [address] Phone: [phone]   Fax:  [phone]  Name: ZEYNAB PIKUS MRN: [id number] Date of Birth: Nov 07, 1975

## 2019-09-21 NOTE — Therapy (Signed)
[facility] [address] [place], [place], [address] Phone: [phone]   Fax:  [phone]  Physical Therapy Treatment  Patient Details  Name: [name] MRN: [id number] Date of Birth: [birth year] Referring Provider (PT): McLean-Scocuzza, Olivia Mackie; Melvenia Beam, MD   Encounter Date: 09/21/2019  PT End of Session - 09/21/19 1020    Visit Number  4    Number of Visits  12    Date for PT Re-Evaluation  10/20/19    Authorization Type  Primary: Medicare Secondary: BCBS Tertiary: Medicaid of [place]    Authorization Time Period  09/08/19- 10/20/19    Authorization - Visit Number  4    Authorization - Number of Visits  10    PT Start Time  1005    PT Stop Time  1045    PT Time Calculation (min)  40 min    Activity Tolerance  Patient limited by pain;No increased pain   Pain reduced to 8/10 followng manual STM   Behavior During Therapy  [facility] for tasks assessed/performed       Past Medical History:  Diagnosis Date  . Abdominal pain, epigastric 12/17/2015  . Anxiety    pt denies having dx of anxiety  . BCC (basal cell carcinoma of skin)    right foot   . Bunion, right foot   . Chronic cough   . Closed fibular fracture 10/2008   Left - Sustained 2/2 fall down stairs (same fall as tibular fracture). S/P internal fixation.  . Duodenitis    EGD (07/14/2011) showing chronic duodenitis, H.pyloir neg  . Elevated liver enzymes   . Endometriosis    S/P TAH and salpingo-oophorectomy, right  . Epigastric pain    Chronic, thought 2/2 gastritis, EGD (07/14/2011) showing chronic duodenitis, H.pyloir neg // Repeat EGD (09/2011) - esophagealring, sessible polyp in stomach body, hiatal hernia - rec ad probiotic, await bx  . Esophageal ring    s/p dilatation during EGD (07/2011) - Dr. Oneida Alar  . Fatty liver   . Gastritis    Thought due to chronic NSAID use 2/2 pain from her congenital hip abnormality  . Headache(784.0)    Migraines  . Hidradenitis  suppurativa 05/2006  . 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 With chronic OA of hip  . History of migraine headaches    Typical symptoms - bright lights, unilateral (starting behind her left ear), throbbing .  Marland Kitchen Hot flashes   . Hypertension   . Internal hemorrhoids   . Osteoarthritis of hip   . Ovarian cancer ([place]) 1995  . Protrusio acetabuli    diagnosed at age 47  . Sessile colonic polyp    noted 09/2011 -- > rec colonoscopy in 10 years, 2023  . Surgical menopause 08/2007   On HRT. Occuring since 01/09 following TAH and R-salpingo-oophorectomy   . Tibial fracture 10/2008   Left - Sustained 2/2 fall down stairs. Patient is S/P closed internal fixation with Smith Nephew tibial nail locked proximal and distal  . Vitamin D deficiency 11/2009   Vit D level 15 in 11/2009    Past Surgical History:  Procedure Laterality Date  . ABDOMINAL HYSTERECTOMY    . CESAREAN SECTION  08/2005   Primary low transverse  . COLONOSCOPY  09/22/2011   [id number] polyp in the rectum/Internal hemorrhoids, benign path  . ESOPHAGOGASTRODUODENOSCOPY  09/22/2011   [id number], esophageal/Sessile polyp in the body of the stomach/Hiatal hernia/ABDOMINAL  PAIN LIKELY FUNCTIONAL V. POST-INFECTIOUS IBS, path: mild chronic gastritis  . FRACTURE SURGERY     Right hip  . JOINT REPLACEMENT     right hip  . LAPAROSCOPY  10/2000   Operative laparoscopy with lysis of right adnexal adhesions and uterointestinal adhesions  . left tib fib    . OTHER SURGICAL HISTORY  02/2009   Closed treatment internal fixation, left tibia with Tamala Julian and Nephew tibial nail locked proximal and distal  . TOTAL ABDOMINAL HYSTERECTOMY W/ BILATERAL SALPINGOOPHORECTOMY  08/2007   for endometriosis. With concurrent right salpingo-oophorectomy  (2009), left oophorectomy (1995)  . TUBAL LIGATION  08/2005   Right tubal ligation    There were no vitals filed for this  visit.  Subjective Assessment - 09/21/19 1009    Subjective  Pt reports increased difficulty moving today in general. Current pain scale 10/10 everywhere especially with LBP and hip pain.    Pertinent History  Bilateral THA    Patient Stated Goals  main goal is to get better and get back to being active.    Currently in Pain?  Yes    Pain Score  10-Worst pain ever    Pain Location  Generalized    Pain Descriptors / Indicators  Nagging    Pain Type  Acute pain    Pain Onset  More than a month ago    Pain Frequency  Constant    Aggravating Factors   laying flat, siting    Pain Relieving Factors  massage    Effect of Pain on Daily Activities  limits         [facility] PT Assessment - 09/21/19 0001      Assessment   Medical Diagnosis  Neck pain, whiplash injuries    Referring Provider (PT)  McLean-Scocuzza, Olivia Mackie; Melvenia Beam, MD    Onset Date/Surgical Date  05/25/19    Next MD Visit  Not scheduled    Prior Therapy  yes      Precautions   Precautions  Fall                   [place] [facility] Adult PT Treatment/Exercise - 09/21/19 0001      Neuro Re-ed    Neuro Re-ed Details   guided breathing 5sec inhale/exhale, practiced with verbal cues, in sequence with PT and independently, BLE elevated on pillow to reduce LBP; guided imagery for "decreased tension"      Neck Exercises: Supine   Neck Retraction  20 reps;3 secs    Neck Retraction Limitations  with exhale    Other Supine Exercise  SKTC 3x 20" with towel assistance    Other Supine Exercise  --      Lumbar Exercises: Supine   Ab Set  20 reps;3 seconds    AB Set Limitations  with exhale    Glut Set  20 reps;3 seconds    Glut Set Limitations  with exhale      Manual Therapy   Manual Therapy  Soft tissue mobilization    Manual therapy comments  Manual complete separate than rest of tx    Soft tissue mobilization  STM to cervical paraspinals and UT region in supine wiht LE elevated               PT Short Term  Goals - 09/08/19 1323      PT SHORT TERM GOAL #1   Title  Patient will be independant with initial HEP to improve functional outcomes  Time  3    Period  Weeks    Status  Achieved    Target Date  09/29/19      PT SHORT TERM GOAL #2   Title  Patient will report less than 5/10 pain globally, on average, at rest, to improve daily function    Time  3    Period  Weeks    Status  On-going    Target Date  09/29/19        PT Long Term Goals - 09/08/19 1324      PT LONG TERM GOAL #1   Title  Patient will report at least 50% improvement in cervical symptoms for improved quality of life.    Time  6    Period  Weeks    Status  New    Target Date  10/20/19      PT LONG TERM GOAL #2   Title  Patient will improve cervical spine ROM in all planes by 50% in order to be able to drive safely.    Time  6    Period  Weeks    Status  New    Target Date  10/20/19      PT LONG TERM GOAL #3   Title  Patient will experience neck/headache pain less than 3/10 in order to improve sleep quality.    Time  6    Period  Weeks    Status  New    Target Date  10/20/19            Plan - 09/21/19 1054    Clinical Impression Statement  Pt limited by pain and decreased spinal mobility noted at entrance.  Began session with guided breathing to improve relaxation and body scan to assist with CNS stress levels.  Soft tissue mobilization complete to improve cervical mobility and reduce pain.  Pt reports reduction in pain to 8/10 following manual, reports increased relaxation and deep breathing noted.  Pt continues to ambulate with heavy pressure on BUE onto RW, encouraged to reduce pressure to assist with pain and mobility.    Personal Factors and Comorbidities  Comorbidity 2;Past/Current Experience;Time since onset of injury/illness/exacerbation    Comorbidities  Globalized pain in multiple sites    Examination-Activity Limitations  Bed  Mobility;Sleep;Bend;Squat;Bathing;Sit;Carry;Stand;Dressing;Transfers;Lift;Locomotion Level;Stairs;Reach Overhead    Examination-Participation Restrictions  Cleaning;Community Activity;Dorita Sciara    Stability/Clinical Decision Making  Evolving/Moderate complexity    Clinical Decision Making  Moderate    Rehab Potential  Fair    PT Frequency  2x / week    PT Duration  6 weeks    PT Treatment/Interventions  ADLs/Self Care Home Management;DME Instruction;Biofeedback;Gait training;Stair training;Cryotherapy;Electrical Stimulation;Functional mobility training;Therapeutic activities;Moist Heat;Therapeutic exercise;Balance training;Manual techniques;Taping;Wheelchair mobility training;Orthotic Fit/Training;Patient/family education;Energy conservation;Joint Manipulations;Spinal Manipulations;Passive range of motion;Neuromuscular re-education;Aquatic Therapy;Traction;Ultrasound;Splinting;Vestibular    PT Next Visit Plan  Continue isometric contractions for pain reduction and strengthening. Manual therapy for decreased neck pain and stiffness, assess low back and incorporate into POC as warranted by patient presentation (referral includes this area). guided breathing as needed    PT Home Exercise Plan  2/3 deep breathing and body scan       Patient will benefit from skilled therapeutic intervention in order to improve the following deficits and impairments:  Abnormal gait, Decreased endurance, Decreased strength, Decreased activity tolerance, Decreased balance, Decreased mobility, Difficulty walking, Pain, Decreased range of motion, Impaired flexibility, Improper body mechanics, Postural dysfunction, Dizziness, Increased fascial restricitons, Increased muscle spasms, Impaired perceived functional ability  Visit Diagnosis: Muscle  weakness (generalized)  Cervicalgia  Other symptoms and signs involving the musculoskeletal system  Pain in left hip  Other abnormalities of gait and mobility  Acute  left-sided low back pain without sciatica     Problem List Patient Active Problem List   Diagnosis Date Noted  . Concussion with no loss of consciousness 08/12/2018  . Acute pain of left shoulder 08/12/2018  . Low back pain 08/12/2018  . Allergic rhinitis 08/05/2018  . Arthritis 07/01/2018  . DDD (degenerative disc disease), lumbar 05/21/2018  . Thoracic arthritis 05/21/2018  . DDD (degenerative disc disease), cervical 01/06/2018  . Hidradenitis suppurativa 10/11/2017  . Chronic frontal sinusitis 10/11/2017  . Fatty liver 10/11/2017  . Chronic ethmoidal sinusitis 09/13/2017  . Photophobia of both eyes 09/13/2017  . Abnormal liver enzymes 09/13/2017  . History of hysterectomy with bilateral oophorectomy 06/24/2017  . Estrogen deficiency 06/24/2017  . Overweight 11/17/2016  . Abdominal pain 11/04/2016  . Headache 12/17/2015  . Chronic constipation 10/12/2012  . Hot flashes 02/20/2012  . Hiatal hernia 02/20/2012  . Chronic pain of both hips 02/20/2012  . Fatigue 12/19/2011  . Acetabulum intrapelvic protrusion into pelvic region or thigh 05/15/2011  . Annual physical exam 10/03/2010  . Hypertension 03/20/2010  . Vitamin D deficiency 08/20/2009  . Anxiety 05/18/2009  . Migraine headache 05/13/2006   Ihor Austin, [place]; [place]  Aldona Lento 09/21/2019, 11:06 AM  [facility] [place], [place], [address] Phone: [phone]   Fax:  [phone]  Name: [name] MRN: [id number] Date of Birth: 07/06/1976

## 2019-09-22 ENCOUNTER — Telehealth: Payer: Self-pay

## 2019-09-22 NOTE — Telephone Encounter (Signed)
Patient called stating she was advised by  they do not do dry needling and would need to be referred elsewhere.  Please follow up

## 2019-09-22 NOTE — Telephone Encounter (Signed)
I sent a new referral to [facility] via [place].

## 2019-09-26 ENCOUNTER — Other Ambulatory Visit: Payer: Self-pay

## 2019-09-26 ENCOUNTER — Ambulatory Visit (HOSPITAL_COMMUNITY): Payer: BC Managed Care – PPO | Admitting: Physical Therapy

## 2019-09-26 ENCOUNTER — Encounter (HOSPITAL_COMMUNITY): Payer: Self-pay | Admitting: Physical Therapy

## 2019-09-26 DIAGNOSIS — M542 Cervicalgia: Secondary | ICD-10-CM | POA: Diagnosis not present

## 2019-09-26 DIAGNOSIS — M6281 Muscle weakness (generalized): Secondary | ICD-10-CM

## 2019-09-26 DIAGNOSIS — R29898 Other symptoms and signs involving the musculoskeletal system: Secondary | ICD-10-CM

## 2019-09-26 DIAGNOSIS — M545 Low back pain, unspecified: Secondary | ICD-10-CM

## 2019-09-26 DIAGNOSIS — R41840 Attention and concentration deficit: Secondary | ICD-10-CM | POA: Diagnosis not present

## 2019-09-26 DIAGNOSIS — R2689 Other abnormalities of gait and mobility: Secondary | ICD-10-CM | POA: Diagnosis not present

## 2019-09-26 DIAGNOSIS — M25552 Pain in left hip: Secondary | ICD-10-CM | POA: Diagnosis not present

## 2019-09-26 DIAGNOSIS — M25551 Pain in right hip: Secondary | ICD-10-CM | POA: Diagnosis not present

## 2019-09-26 DIAGNOSIS — R41844 Frontal lobe and executive function deficit: Secondary | ICD-10-CM | POA: Diagnosis not present

## 2019-09-26 DIAGNOSIS — R41842 Visuospatial deficit: Secondary | ICD-10-CM | POA: Diagnosis not present

## 2019-09-26 NOTE — Therapy (Signed)
[facility] [address] [place], [place], [address] Phone: [phone]   Fax:  [phone]  Physical Therapy Treatment  Patient Details  Name: [name] MRN: [id number] Date of Birth: 02-19-1976 Referring Provider (PT): McLean-Scocuzza, Olivia Mackie; Melvenia Beam, MD   Encounter Date: 09/26/2019  PT End of Session - 09/26/19 0921    Visit Number  5    Number of Visits  12    Date for PT Re-Evaluation  10/20/19    Authorization Type  Primary: Medicare Secondary: BCBS Tertiary: Medicaid of [place]    Authorization Time Period  09/08/19- 10/20/19    Authorization - Visit Number  5    Authorization - Number of Visits  10    PT Start Time  [id number]   patient late to session   PT Stop Time  1008    PT Time Calculation (min)  47 min    Activity Tolerance  Patient limited by pain;No increased pain   Pain reduced to 8/10 followng manual STM   Behavior During Therapy  [facility] for tasks assessed/performed       Past Medical History:  Diagnosis Date  . Abdominal pain, epigastric 12/17/2015  . Anxiety    pt denies having dx of anxiety  . BCC (basal cell carcinoma of skin)    right foot   . Bunion, right foot   . Chronic cough   . Closed fibular fracture 10/2008   Left - Sustained 2/2 fall down stairs (same fall as tibular fracture). S/P internal fixation.  . Duodenitis    EGD (07/14/2011) showing chronic duodenitis, H.pyloir neg  . Elevated liver enzymes   . Endometriosis    S/P TAH and salpingo-oophorectomy, right  . Epigastric pain    Chronic, thought 2/2 gastritis, EGD (07/14/2011) showing chronic duodenitis, H.pyloir neg // Repeat EGD (09/2011) - esophagealring, sessible polyp in stomach body, hiatal hernia - rec ad probiotic, await bx  . Esophageal ring    s/p dilatation during EGD (07/2011) - Dr. Oneida Alar  . Fatty liver   . Gastritis    Thought due to chronic NSAID use 2/2 pain from her congenital hip abnormality  . Headache(784.0)    Migraines  . Hidradenitis suppurativa 05/2006  . 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 With chronic OA of hip  . History of migraine headaches    Typical symptoms - bright lights, unilateral (starting behind her left ear), throbbing .  Marland Kitchen Hot flashes   . Hypertension   . Internal hemorrhoids   . Osteoarthritis of hip   . Ovarian cancer ([place]) 1995  . Protrusio acetabuli    diagnosed at age 89  . Sessile colonic polyp    noted 09/2011 -- > rec colonoscopy in 10 years, 2023  . Surgical menopause 08/2007   On HRT. Occuring since 01/09 following TAH and R-salpingo-oophorectomy   . Tibial fracture 10/2008   Left - Sustained 2/2 fall down stairs. Patient is S/P closed internal fixation with Smith Nephew tibial nail locked proximal and distal  . Vitamin D deficiency 11/2009   Vit D level 15 in 11/2009    Past Surgical History:  Procedure Laterality Date  . ABDOMINAL HYSTERECTOMY    . CESAREAN SECTION  08/2005   Primary low transverse  . COLONOSCOPY  09/22/2011   [id number] polyp in the rectum/Internal hemorrhoids, benign path  . ESOPHAGOGASTRODUODENOSCOPY  09/22/2011   [id number], esophageal/Sessile polyp in the body  of the stomach/Hiatal hernia/ABDOMINAL PAIN LIKELY FUNCTIONAL V. POST-INFECTIOUS IBS, path: mild chronic gastritis  . FRACTURE SURGERY     Right hip  . JOINT REPLACEMENT     right hip  . LAPAROSCOPY  10/2000   Operative laparoscopy with lysis of right adnexal adhesions and uterointestinal adhesions  . left tib fib    . OTHER SURGICAL HISTORY  02/2009   Closed treatment internal fixation, left tibia with Tamala Julian and Nephew tibial nail locked proximal and distal  . TOTAL ABDOMINAL HYSTERECTOMY W/ BILATERAL SALPINGOOPHORECTOMY  08/2007   for endometriosis. With concurrent right salpingo-oophorectomy  (2009), left oophorectomy (1995)  . TUBAL LIGATION  08/2005   Right tubal ligation    There were no vitals  filed for this visit.  Subjective Assessment - 09/26/19 0946    Subjective  States her pain is 8.5 in her back and that it gets more intense in her back    Pertinent History  Bilateral THA    Patient Stated Goals  main goal is to get better and get back to being active.    Pain Score  8     Pain Location  Back    Pain Orientation  Mid;Left;Right    Pain Descriptors / Indicators  Throbbing    Pain Onset  More than a month ago         [facility] PT Assessment - 09/26/19 0001      Assessment   Medical Diagnosis  Neck pain, whiplash injuries    Referring Provider (PT)  McLean-Scocuzza, Olivia Mackie; Melvenia Beam, MD    Onset Date/Surgical Date  05/25/19    Next MD Visit  Not scheduled    Prior Therapy  yes                   [place] Adult PT Treatment/Exercise - 09/26/19 0001      Lumbar Exercises: Prone   Other Prone Lumbar Exercises  gentle passive pelvic rocking 5 minutes       Modalities   Modalities  Moist Heat      Moist Heat Therapy   Number Minutes Moist Heat  20 Minutes    Moist Heat Location  Lumbar Spine   during manual therapy     Manual Therapy   Manual Therapy  Joint mobilization;Soft tissue mobilization    Manual therapy comments  Manual complete separate than rest of tx    Soft tissue mobilization  STM to lumbar parapinals - tolerated well pain reduction noted- bilaterally, pillow under feet             PT Education - 09/26/19 1108    Education Details  educated patient on dry needling. Benefits/risks. Demonstrated dry needling on PT's own wrist extensors. Answered all questions in regards to Dry needling and provided patient with basic information on dry needling. Educated patietn on benefits of gentle cardio (has recumbent bike at home) and how that can help with mobility, pain and healing - discussed starting at 5 minutes and to not push through pain.    Person(s) Educated  Patient    Methods  Explanation;Handout;Demonstration    Comprehension   Verbalized understanding       PT Short Term Goals - 09/08/19 1323      PT SHORT TERM GOAL #1   Title  Patient will be independant with initial HEP to improve functional outcomes    Time  3    Period  Weeks    Status  Achieved  Target Date  09/29/19      PT SHORT TERM GOAL #2   Title  Patient will report less than 5/10 pain globally, on average, at rest, to improve daily function    Time  3    Period  Weeks    Status  On-going    Target Date  09/29/19        PT Long Term Goals - 09/08/19 1324      PT LONG TERM GOAL #1   Title  Patient will report at least 50% improvement in cervical symptoms for improved quality of life.    Time  6    Period  Weeks    Status  New    Target Date  10/20/19      PT LONG TERM GOAL #2   Title  Patient will improve cervical spine ROM in all planes by 50% in order to be able to drive safely.    Time  6    Period  Weeks    Status  New    Target Date  10/20/19      PT LONG TERM GOAL #3   Title  Patient will experience neck/headache pain less than 3/10 in order to improve sleep quality.    Time  6    Period  Weeks    Status  New    Target Date  10/20/19            Plan - 09/26/19 1113    Clinical Impression Statement  Focused on education of dry needling today. Patient very hesitant towards dry needling at this time. Provided patient with handout of dry needling and encouraged patient to research dry needling and come to next session with any questions or concerns. No dry needling performed on this date due to patient hesitancy. Focused on gently STM and pelvic rocking in supine with thermotherapy. Pain reduction noted reporting 6/10 pain end of session. Will follow up with dry needling next session to determine if it is an appropriate intervention for patient.    Personal Factors and Comorbidities  Comorbidity 2;Past/Current Experience;Time since onset of injury/illness/exacerbation    Comorbidities  Globalized pain in multiple sites     Examination-Activity Limitations  Bed Mobility;Sleep;Bend;Squat;Bathing;Sit;Carry;Stand;Dressing;Transfers;Lift;Locomotion Level;Stairs;Reach Overhead    Examination-Participation Restrictions  Cleaning;Community Activity;Dorita Sciara    Stability/Clinical Decision Making  Evolving/Moderate complexity    Rehab Potential  Fair    PT Frequency  2x / week    PT Duration  6 weeks    PT Treatment/Interventions  ADLs/Self Care Home Management;DME Instruction;Biofeedback;Gait training;Stair training;Cryotherapy;Electrical Stimulation;Functional mobility training;Therapeutic activities;Moist Heat;Therapeutic exercise;Balance training;Manual techniques;Taping;Wheelchair mobility training;Orthotic Fit/Training;Patient/family education;Energy conservation;Joint Manipulations;Spinal Manipulations;Passive range of motion;Neuromuscular re-education;Aquatic Therapy;Traction;Ultrasound;Splinting;Vestibular    PT Next Visit Plan  f/u with DN. prone STM, thermotherapy and gently unweighted ROM as tolerated.    PT Home Exercise Plan  2/3 deep breathing and body scan       Patient will benefit from skilled therapeutic intervention in order to improve the following deficits and impairments:  Abnormal gait, Decreased endurance, Decreased strength, Decreased activity tolerance, Decreased balance, Decreased mobility, Difficulty walking, Pain, Decreased range of motion, Impaired flexibility, Improper body mechanics, Postural dysfunction, Dizziness, Increased fascial restricitons, Increased muscle spasms, Impaired perceived functional ability  Visit Diagnosis: Muscle weakness (generalized)  Cervicalgia  Other symptoms and signs involving the musculoskeletal system  Pain in left hip  Acute left-sided low back pain without sciatica     Problem List Patient Active Problem List   Diagnosis Date Noted  .  Concussion with no loss of consciousness 08/12/2018  . Acute pain of left shoulder 08/12/2018  . Low  back pain 08/12/2018  . Allergic rhinitis 08/05/2018  . Arthritis 07/01/2018  . DDD (degenerative disc disease), lumbar 05/21/2018  . Thoracic arthritis 05/21/2018  . DDD (degenerative disc disease), cervical 01/06/2018  . Hidradenitis suppurativa 10/11/2017  . Chronic frontal sinusitis 10/11/2017  . Fatty liver 10/11/2017  . Chronic ethmoidal sinusitis 09/13/2017  . Photophobia of both eyes 09/13/2017  . Abnormal liver enzymes 09/13/2017  . History of hysterectomy with bilateral oophorectomy 06/24/2017  . Estrogen deficiency 06/24/2017  . Overweight 11/17/2016  . Abdominal pain 11/04/2016  . Headache 12/17/2015  . Chronic constipation 10/12/2012  . Hot flashes 02/20/2012  . Hiatal hernia 02/20/2012  . Chronic pain of both hips 02/20/2012  . Fatigue 12/19/2011  . Acetabulum intrapelvic protrusion into pelvic region or thigh 05/15/2011  . Annual physical exam 10/03/2010  . Hypertension 03/20/2010  . Vitamin D deficiency 08/20/2009  . Anxiety 05/18/2009  . Migraine headache 05/13/2006    11:18 AM, 09/26/19 Jerene Pitch, DPT Physical Therapy with [facility]  [phone] office  [facility] [address] [place], [place], [address] Phone: [phone]   Fax:  [phone]  Name: [name] MRN: [id number] Date of Birth: 1976-02-05

## 2019-09-26 NOTE — Patient Instructions (Signed)

## 2019-09-28 ENCOUNTER — Ambulatory Visit (HOSPITAL_COMMUNITY): Payer: BC Managed Care – PPO | Admitting: Physical Therapy

## 2019-09-28 ENCOUNTER — Ambulatory Visit (HOSPITAL_COMMUNITY): Payer: BC Managed Care – PPO

## 2019-09-28 ENCOUNTER — Other Ambulatory Visit: Payer: Self-pay

## 2019-09-28 ENCOUNTER — Encounter (HOSPITAL_COMMUNITY): Payer: Self-pay

## 2019-09-28 DIAGNOSIS — R41840 Attention and concentration deficit: Secondary | ICD-10-CM | POA: Diagnosis not present

## 2019-09-28 DIAGNOSIS — R41844 Frontal lobe and executive function deficit: Secondary | ICD-10-CM

## 2019-09-28 DIAGNOSIS — M545 Low back pain: Secondary | ICD-10-CM | POA: Diagnosis not present

## 2019-09-28 DIAGNOSIS — R29898 Other symptoms and signs involving the musculoskeletal system: Secondary | ICD-10-CM | POA: Diagnosis not present

## 2019-09-28 DIAGNOSIS — R41842 Visuospatial deficit: Secondary | ICD-10-CM

## 2019-09-28 DIAGNOSIS — R2689 Other abnormalities of gait and mobility: Secondary | ICD-10-CM | POA: Diagnosis not present

## 2019-09-28 DIAGNOSIS — M6281 Muscle weakness (generalized): Secondary | ICD-10-CM | POA: Diagnosis not present

## 2019-09-28 DIAGNOSIS — M25551 Pain in right hip: Secondary | ICD-10-CM | POA: Diagnosis not present

## 2019-09-28 DIAGNOSIS — M25552 Pain in left hip: Secondary | ICD-10-CM | POA: Diagnosis not present

## 2019-09-28 DIAGNOSIS — M542 Cervicalgia: Secondary | ICD-10-CM | POA: Diagnosis not present

## 2019-09-28 NOTE — Patient Instructions (Signed)
Tracking water intake iPhone app: My Water Recommended ounces: 94 ounces  Sleep Story Free Apps: Sleep stories for adults Sleep Sounds and Meditation  HOMEWORK:  - Read Handout for Nutrition and Emotional Regulation Skills.  - Try one or both techniques found on Emotional Regulation Skill handout when faced when feeling increased stress anxiety; either by a specific event or just a feeling of buildup.

## 2019-09-28 NOTE — Therapy (Signed)
[place], [place], [address] Phone: [phone]   Fax:  [phone]  Occupational Therapy Treatment  Patient Details  Name: [name] MRN: [id number] Date of Birth: [birth year] No data recorded  Encounter Date: 09/28/2019  OT End of Session - 09/28/19 1148    Visit Number  4    Number of Visits  6    Date for OT Re-Evaluation  10/21/19   mini reassessment 10/07/19   Authorization Type  1) BCBS 2) Medicare A & B 3) Medicaid    Authorization Time Period  progress note before 10th visit    Authorization - Visit Number  4    Authorization - Number of Visits  10    OT Start Time  1123   Pt arrived late   OT Stop Time  1200    OT Time Calculation (min)  37 min    Activity Tolerance  Patient tolerated treatment well    Behavior During Therapy  WFL for tasks assessed/performed       Past Medical History:  Diagnosis Date  . Abdominal pain, epigastric 12/17/2015  . Anxiety    pt denies having dx of anxiety  . BCC (basal cell carcinoma of skin)    right foot   . Bunion, right foot   . Chronic cough   . Closed fibular fracture 10/2008   Left - Sustained 2/2 fall down stairs (same fall as tibular fracture). S/P internal fixation.  . Duodenitis    EGD (07/14/2011) showing chronic duodenitis, H.pyloir neg  . Elevated liver enzymes   . Endometriosis    S/P TAH and salpingo-oophorectomy, right  . Epigastric pain    Chronic, thought 2/2 gastritis, EGD (07/14/2011) showing chronic duodenitis, H.pyloir neg // Repeat EGD (09/2011) - esophagealring, sessible polyp in stomach body, hiatal hernia - rec ad probiotic, await bx  . Esophageal ring    s/p dilatation during EGD (07/2011) - Dr. Oneida Alar  . Fatty liver   . Gastritis    Thought due to chronic NSAID use 2/2 pain from her congenital hip abnormality  . Headache(784.0)    Migraines  . Hidradenitis suppurativa 05/2006  . Hip deformity, congenital    Protrusio  acetabuli with articular sclerosis and flattening of femoral heads. With chronic OA of hip  . History of migraine headaches    Typical symptoms - bright lights, unilateral (starting behind her left ear), throbbing .  Marland Kitchen Hot flashes   . Hypertension   . Internal hemorrhoids   . Osteoarthritis of hip   . Ovarian cancer ([place]) 1995  . Protrusio acetabuli    diagnosed at age 58  . Sessile colonic polyp    noted 09/2011 -- > rec colonoscopy in 10 years, 2023  . Surgical menopause 08/2007   On HRT. Occuring since 01/09 following TAH and R-salpingo-oophorectomy   . Tibial fracture 10/2008   Left - Sustained 2/2 fall down stairs. Patient is S/P closed internal fixation with Smith Nephew tibial nail locked proximal and distal  . Vitamin D deficiency 11/2009   Vit D level 15 in 11/2009    Past Surgical History:  Procedure Laterality Date  . ABDOMINAL HYSTERECTOMY    . CESAREAN SECTION  08/2005   Primary low transverse  . COLONOSCOPY  09/22/2011   [id number] polyp in the rectum/Internal hemorrhoids, benign path  . ESOPHAGOGASTRODUODENOSCOPY  09/22/2011   [id number], esophageal/Sessile polyp in the body of the stomach/Hiatal hernia/ABDOMINAL PAIN LIKELY FUNCTIONAL V. POST-INFECTIOUS  IBS, path: mild chronic gastritis  . FRACTURE SURGERY     Right hip  . JOINT REPLACEMENT     right hip  . LAPAROSCOPY  10/2000   Operative laparoscopy with lysis of right adnexal adhesions and uterointestinal adhesions  . left tib fib    . OTHER SURGICAL HISTORY  02/2009   Closed treatment internal fixation, left tibia with Tamala Julian and Nephew tibial nail locked proximal and distal  . TOTAL ABDOMINAL HYSTERECTOMY W/ BILATERAL SALPINGOOPHORECTOMY  08/2007   for endometriosis. With concurrent right salpingo-oophorectomy  (2009), left oophorectomy (1995)  . TUBAL LIGATION  08/2005   Right tubal ligation    There were no vitals filed for this visit.  Subjective Assessment - 09/28/19 1148    Subjective   S: I am  trying a few strategies. There were at least 2 night where I felt like I had good quality sleep.    Currently in Pain?  Yes    Pain Score  8     Pain Location  Generalized    Pain Descriptors / Indicators  Aching    Pain Type  Acute pain         [facility] OT Assessment - 09/28/19 1305      Assessment   Medical Diagnosis  Post Concussion Syndrome      Precautions   Precautions  Fall               OT Treatments/Exercises (OP) - 09/28/19 0001      ADLs   ADL Comments  Sleep Hygeine follow up. Patient was able to list 3 techniques she is currently trying for good sleep hygiene (Sleep stories, Deep breathing, and setting a consistant bed time). Pt reports difficulty with remaining asleep when she getting up to use the bathroom. She is unable to fall back to sleep once awake. Filled out both Stress Symptom Checklist and How Stressed Am I? worksheets. Both worksheets indicate a very high level of stress (media tab). Researched medication side effects to see if they are cause for any symptoms she is experiencing. Discussed ways to monitor her water intake using a free phone app. Free phone app discussed for sleep stories.              OT Education - 09/28/19 1304    Education Details  Provided handout for Nutrition and relation to stress as well as Emotional Regulation Skills    Person(s) Educated  Patient    Methods  Explanation;Handout    Comprehension  Verbalized understanding       OT Short Term Goals - 09/16/19 1001      OT SHORT TERM GOAL #1   Title  Pt will report improved sleep by successfully utilizing sleep hygiene education and routine developed during therapy.    Time  6    Period  Weeks    Status  On-going    Target Date  10/21/19      OT SHORT TERM GOAL #2   Title  Pt will be report decreased stress and anxiety levels by verbalizing at least 3 sucessful stress management techniques utilized during times of increased stress for calm and focus.    Time  6     Period  Weeks    Status  On-going      OT SHORT TERM GOAL #3   Title  Pt will improve ADL and I/ADL performance by reporting use of at least 2 cognitive strategies for planning and time management.  Time  6    Period  Weeks    Status  On-going      OT SHORT TERM GOAL #4   Title  Pt will be educated on and verbalize use of at least 2 energy conservation techniques to increase energy and decrease fatigue during the day.    Time  6    Period  Weeks    Status  On-going      OT SHORT TERM GOAL #5   Title  Pt will report improvement in ability to engage in family movie and game nights by utilizing utilizing sensory processing strategies to decrease "brain jitters" and anxiety/overwhelmed feeling.    Time  6    Period  Weeks    Status  On-going      OT SHORT TERM GOAL #6   Title  Pt will improve ability to fix a simple meal or bake preferred items by utilizing compensatory strategies for short term memory deficits.    Time  6    Period  Weeks    Status  On-going      OT SHORT TERM GOAL #7   Title  Pt will be educated on and utilize compensatory strategies for visual impairments (including visual-perceptual issues) to improve ability to read.    Time  6    Period  Weeks    Status  On-going               Plan - 09/28/19 1315    Clinical Impression Statement  During session, discussed tracking water intake and any current strategies. pt reports with her previous Android phone she was using an app although has not been using one since she has switched to an iPhone. Pt educated on number of ounces she should take in and the app MyWater that will help with tracking. Provided information on free app to sleep stories: Social Stories for Adults. Discussed any techniques being used for her sleep hygiene. Patient mentions: using sleep stories, deep breathing techniques, and setting a consistant bed time. Reviewed side effects of current medications to determine if any were effecting  her ability to sleep. Pt reports, she is currently experiencing all side effects of Flexeril. She does not take it frequently although when she has she feels the side effects do not justify the need to take. Stress symptom worksheets determine that patient has a high level of stress. Due to time constraint unable to further explore stress and anxiety management techniques.    Cognitive Skills  Attention;Thought;Emotional;Energy/Drive;Memory;Problem Solve;Safety Awareness;Sequencing    Psychosocial Skills  Coping Strategies;Environmental  Adaptations;Habits;Interpersonal Interaction;Routines and Behaviors    Plan  P: Follow up on Homework- emotinal regulation skills and nutrition. Any use of water intake app or sleep stories app? References stress symptom checklist and How Stressed Am I? worksheets from last time begin working on stress management strategies and techniques with handouts provided. Find an outlet for her stress and help her to utilize...chair yoga?..light chair aerobics?Marland KitchenMarland Kitchen    Consulted and Agree with Plan of Care  Patient       Patient will benefit from skilled therapeutic intervention in order to improve the following deficits and impairments:     Cognitive Skills: Attention, Thought, Emotional, Energy/Drive, Memory, Problem Solve, Safety Awareness, Sequencing Psychosocial Skills: Coping Strategies, Environmental  Adaptations, Habits, Interpersonal Interaction, Routines and Behaviors   Visit Diagnosis: Frontal lobe and executive function deficit  Attention and concentration deficit  Visuospatial deficit    Problem List Patient Active Problem List  Diagnosis Date Noted  . Concussion with no loss of consciousness 08/12/2018  . Acute pain of left shoulder 08/12/2018  . Low back pain 08/12/2018  . Allergic rhinitis 08/05/2018  . Arthritis 07/01/2018  . DDD (degenerative disc disease), lumbar 05/21/2018  . Thoracic arthritis 05/21/2018  . DDD (degenerative disc disease),  cervical 01/06/2018  . Hidradenitis suppurativa 10/11/2017  . Chronic frontal sinusitis 10/11/2017  . Fatty liver 10/11/2017  . Chronic ethmoidal sinusitis 09/13/2017  . Photophobia of both eyes 09/13/2017  . Abnormal liver enzymes 09/13/2017  . History of hysterectomy with bilateral oophorectomy 06/24/2017  . Estrogen deficiency 06/24/2017  . Overweight 11/17/2016  . Abdominal pain 11/04/2016  . Headache 12/17/2015  . Chronic constipation 10/12/2012  . Hot flashes 02/20/2012  . Hiatal hernia 02/20/2012  . Chronic pain of both hips 02/20/2012  . Fatigue 12/19/2011  . Acetabulum intrapelvic protrusion into pelvic region or thigh 05/15/2011  . Annual physical exam 10/03/2010  . Hypertension 03/20/2010  . Vitamin D deficiency 08/20/2009  . Anxiety 05/18/2009  . Migraine headache 05/13/2006   Ailene Ravel, OTR/L,CBIS  [phone]  09/28/2019, 1:31 PM  [facility] [address] [place], [place], [address] Phone: [phone]   Fax:  [phone]  Name: ZARIELLE GRUDEN MRN: [id number] Date of Birth: [birth year]

## 2019-09-30 ENCOUNTER — Telehealth (HOSPITAL_COMMUNITY): Payer: Self-pay | Admitting: Physical Therapy

## 2019-09-30 ENCOUNTER — Ambulatory Visit (HOSPITAL_COMMUNITY): Payer: BC Managed Care – PPO | Admitting: Physical Therapy

## 2019-09-30 NOTE — Telephone Encounter (Signed)
pt called to cancel today's appt due to the weather

## 2019-10-03 ENCOUNTER — Ambulatory Visit (HOSPITAL_COMMUNITY): Payer: BC Managed Care – PPO | Admitting: Physical Therapy

## 2019-10-03 ENCOUNTER — Other Ambulatory Visit: Payer: Self-pay

## 2019-10-03 ENCOUNTER — Encounter (HOSPITAL_COMMUNITY): Payer: Self-pay | Admitting: Physical Therapy

## 2019-10-03 DIAGNOSIS — R41844 Frontal lobe and executive function deficit: Secondary | ICD-10-CM | POA: Diagnosis not present

## 2019-10-03 DIAGNOSIS — R29898 Other symptoms and signs involving the musculoskeletal system: Secondary | ICD-10-CM | POA: Diagnosis not present

## 2019-10-03 DIAGNOSIS — R41840 Attention and concentration deficit: Secondary | ICD-10-CM | POA: Diagnosis not present

## 2019-10-03 DIAGNOSIS — M545 Low back pain: Secondary | ICD-10-CM | POA: Diagnosis not present

## 2019-10-03 DIAGNOSIS — R2689 Other abnormalities of gait and mobility: Secondary | ICD-10-CM | POA: Diagnosis not present

## 2019-10-03 DIAGNOSIS — R41842 Visuospatial deficit: Secondary | ICD-10-CM | POA: Diagnosis not present

## 2019-10-03 DIAGNOSIS — M25551 Pain in right hip: Secondary | ICD-10-CM | POA: Diagnosis not present

## 2019-10-03 DIAGNOSIS — M542 Cervicalgia: Secondary | ICD-10-CM | POA: Diagnosis not present

## 2019-10-03 DIAGNOSIS — M6281 Muscle weakness (generalized): Secondary | ICD-10-CM

## 2019-10-03 DIAGNOSIS — M25552 Pain in left hip: Secondary | ICD-10-CM | POA: Diagnosis not present

## 2019-10-03 NOTE — Patient Instructions (Signed)
KNEE: Extension, Long Arc Quads - Sitting    Raise leg until knee is straight. ___ reps per set, ___ sets per day, ___ days per week  Copyright  VHI. All rights reserved.

## 2019-10-03 NOTE — Therapy (Signed)
[facility] [address] [place], [place], [address] Phone: [phone]   Fax:  [phone]  Physical Therapy Treatment  Patient Details  Name: [name] MRN: [id number] Date of Birth: 08-Jun-1976 Referring Provider (PT): McLean-Scocuzza, Olivia Mackie; Melvenia Beam, MD   Encounter Date: 10/03/2019  PT End of Session - 10/03/19 1342    Visit Number  6    Number of Visits  12    Date for PT Re-Evaluation  10/20/19    Authorization Type  Primary: Medicare Secondary: BCBS Tertiary: Medicaid of [place]    Authorization Time Period  09/08/19- 10/20/19    Authorization - Visit Number  6    Authorization - Number of Visits  10    PT Start Time  [id number]    PT Stop Time  1230    PT Time Calculation (min)  45 min    Activity Tolerance  Patient limited by pain;No increased pain   Pain reduced to 8/10 followng manual STM   Behavior During Therapy  [facility] for tasks assessed/performed       Past Medical History:  Diagnosis Date  . Abdominal pain, epigastric 12/17/2015  . Anxiety    pt denies having dx of anxiety  . BCC (basal cell carcinoma of skin)    right foot   . Bunion, right foot   . Chronic cough   . Closed fibular fracture 10/2008   Left - Sustained 2/2 fall down stairs (same fall as tibular fracture). S/P internal fixation.  . Duodenitis    EGD (07/14/2011) showing chronic duodenitis, H.pyloir neg  . Elevated liver enzymes   . Endometriosis    S/P TAH and salpingo-oophorectomy, right  . Epigastric pain    Chronic, thought 2/2 gastritis, EGD (07/14/2011) showing chronic duodenitis, H.pyloir neg // Repeat EGD (09/2011) - esophagealring, sessible polyp in stomach body, hiatal hernia - rec ad probiotic, await bx  . Esophageal ring    s/p dilatation during EGD (07/2011) - Dr. Oneida Alar  . Fatty liver   . Gastritis    Thought due to chronic NSAID use 2/2 pain from her congenital hip abnormality  . Headache(784.0)    Migraines  . Hidradenitis  suppurativa 05/2006  . 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 With chronic OA of hip  . History of migraine headaches    Typical symptoms - bright lights, unilateral (starting behind her left ear), throbbing .  Marland Kitchen Hot flashes   . Hypertension   . Internal hemorrhoids   . Osteoarthritis of hip   . Ovarian cancer ([place]) 1995  . Protrusio acetabuli    diagnosed at age 65  . Sessile colonic polyp    noted 09/2011 -- > rec colonoscopy in 10 years, 2023  . Surgical menopause 08/2007   On HRT. Occuring since 01/09 following TAH and R-salpingo-oophorectomy   . Tibial fracture 10/2008   Left - Sustained 2/2 fall down stairs. Patient is S/P closed internal fixation with Smith Nephew tibial nail locked proximal and distal  . Vitamin D deficiency 11/2009   Vit D level 15 in 11/2009    Past Surgical History:  Procedure Laterality Date  . ABDOMINAL HYSTERECTOMY    . CESAREAN SECTION  08/2005   Primary low transverse  . COLONOSCOPY  09/22/2011   [id number] polyp in the rectum/Internal hemorrhoids, benign path  . ESOPHAGOGASTRODUODENOSCOPY  09/22/2011   [id number], esophageal/Sessile polyp in the body of the stomach/Hiatal hernia/ABDOMINAL  PAIN LIKELY FUNCTIONAL V. POST-INFECTIOUS IBS, path: mild chronic gastritis  . FRACTURE SURGERY     Right hip  . JOINT REPLACEMENT     right hip  . LAPAROSCOPY  10/2000   Operative laparoscopy with lysis of right adnexal adhesions and uterointestinal adhesions  . left tib fib    . OTHER SURGICAL HISTORY  02/2009   Closed treatment internal fixation, left tibia with Tamala Julian and Nephew tibial nail locked proximal and distal  . TOTAL ABDOMINAL HYSTERECTOMY W/ BILATERAL SALPINGOOPHORECTOMY  08/2007   for endometriosis. With concurrent right salpingo-oophorectomy  (2009), left oophorectomy (1995)  . TUBAL LIGATION  08/2005   Right tubal ligation    There were no vitals filed for this  visit.  Subjective Assessment - 10/03/19 1158    Subjective  pt states she cant even distinquish her pain anymore.  STates she cannot get up out of chair most the time without severe pain. 8/10 pain in hips and lower back today.    Currently in Pain?  Yes    Pain Score  8     Pain Location  Back    Pain Orientation  Right;Left                       [facility] Adult PT Treatment/Exercise - 10/03/19 0001      Bed Mobility   Bed Mobility  Supine to Sit;Sitting - Scoot to [employer] of Bed;Sit to Supine    Supine to Sit  Supervision/Verbal cueing    Sit to Supine  Supervision/Verbal cueing      Transfers   Transfers  --      Ambulation/Gait   Gait Comments  decreased WB on Rt (tho pt states she is trying to get weight off of LT LE) with RW, ER of Rt foot       Lumbar Exercises: Seated   Long Arc Quad on Chair  Right;Left;10 reps    Sit to Stand  Other (comment)    Sit to Stand Limitations  2 reps using UE to push not pull on walker, cues for osturing getting feet under, "nose over toes"    Other Seated Lumbar Exercises  seated march 10 reps alternating      Lumbar Exercises: Supine   Heel Slides  5 reps    Bridge  10 reps             PT Education - 10/03/19 1337    Education Details  importance of using LE mm correctly, tending to use UE instead.  importance of increasing activity, working on improving mobility at home.    Person(s) Educated  Patient    Methods  Explanation    Comprehension  Verbalized understanding       PT Short Term Goals - 09/08/19 1323      PT SHORT TERM GOAL #1   Title  Patient will be independant with initial HEP to improve functional outcomes    Time  3    Period  Weeks    Status  Achieved    Target Date  09/29/19      PT SHORT TERM GOAL #2   Title  Patient will report less than 5/10 pain globally, on average, at rest, to improve daily function    Time  3    Period  Weeks    Status  On-going    Target Date  09/29/19         PT Long Term Goals -  09/08/19 1324      PT LONG TERM GOAL #1   Title  Patient will report at least 50% improvement in cervical symptoms for improved quality of life.    Time  6    Period  Weeks    Status  New    Target Date  10/20/19      PT LONG TERM GOAL #2   Title  Patient will improve cervical spine ROM in all planes by 50% in order to be able to drive safely.    Time  6    Period  Weeks    Status  New    Target Date  10/20/19      PT LONG TERM GOAL #3   Title  Patient will experience neck/headache pain less than 3/10 in order to improve sleep quality.    Time  6    Period  Weeks    Status  New    Target Date  10/20/19            Plan - 10/03/19 1328    Clinical Impression Statement  Pt with noted difficulty rising from chair from waiting room and extreme antalgia with ambulation.  Pt reports she tries to get her weight off her Lt LE as she is "scared of falling", however gait indicates decreased stance on Rt LE.  PT also tends to use UE to move her LE's rather than using the mm.  Pt without pain in LE's, however states she has groin pain with movement of LE's.  Added LE strengthening this session with noted reduced ROM acheived, slow and intentional movements as well.  Symptom magnification but denied pain other than in her groin and hips when asked. Worked on general mobility, sit to stand using LE and UE appropriately with "nose over toes" posturing.  Pt tends to keep knees at 90 and "pull" with 1 UE on AD with noted struggle to stand.  Pt with intentional movments throughout session with inconsistent LE mm activitation with activities.  Pt given LAQ to add to HEP as well as working on transferring.    Personal Factors and Comorbidities  Comorbidity 2;Past/Current Experience;Time since onset of injury/illness/exacerbation    Comorbidities  Globalized pain in multiple sites    Examination-Activity Limitations  Bed  Mobility;Sleep;Bend;Squat;Bathing;Sit;Carry;Stand;Dressing;Transfers;Lift;Locomotion Level;Stairs;Reach Overhead    Examination-Participation Restrictions  Cleaning;Community Activity;Dorita Sciara    Stability/Clinical Decision Making  Evolving/Moderate complexity    Rehab Potential  Fair    PT Frequency  2x / week    PT Duration  6 weeks    PT Treatment/Interventions  ADLs/Self Care Home Management;DME Instruction;Biofeedback;Gait training;Stair training;Cryotherapy;Electrical Stimulation;Functional mobility training;Therapeutic activities;Moist Heat;Therapeutic exercise;Balance training;Manual techniques;Taping;Wheelchair mobility training;Orthotic Fit/Training;Patient/family education;Energy conservation;Joint Manipulations;Spinal Manipulations;Passive range of motion;Neuromuscular re-education;Aquatic Therapy;Traction;Ultrasound;Splinting;Vestibular    PT Next Visit Plan  Contiue to progress LE strengthening, gait with LRAD and normalized movement.    PT Home Exercise Plan  2/3 deep breathing and body scan  10/03/19: LAQ       Patient will benefit from skilled therapeutic intervention in order to improve the following deficits and impairments:  Abnormal gait, Decreased endurance, Decreased strength, Decreased activity tolerance, Decreased balance, Decreased mobility, Difficulty walking, Pain, Decreased range of motion, Impaired flexibility, Improper body mechanics, Postural dysfunction, Dizziness, Increased fascial restricitons, Increased muscle spasms, Impaired perceived functional ability  Visit Diagnosis: Muscle weakness (generalized)  Cervicalgia  Other symptoms and signs involving the musculoskeletal system     Problem List Patient Active Problem List   Diagnosis Date Noted  .  Concussion with no loss of consciousness 08/12/2018  . Acute pain of left shoulder 08/12/2018  . Low back pain 08/12/2018  . Allergic rhinitis 08/05/2018  . Arthritis 07/01/2018  . DDD (degenerative  disc disease), lumbar 05/21/2018  . Thoracic arthritis 05/21/2018  . DDD (degenerative disc disease), cervical 01/06/2018  . Hidradenitis suppurativa 10/11/2017  . Chronic frontal sinusitis 10/11/2017  . Fatty liver 10/11/2017  . Chronic ethmoidal sinusitis 09/13/2017  . Photophobia of both eyes 09/13/2017  . Abnormal liver enzymes 09/13/2017  . History of hysterectomy with bilateral oophorectomy 06/24/2017  . Estrogen deficiency 06/24/2017  . Overweight 11/17/2016  . Abdominal pain 11/04/2016  . Headache 12/17/2015  . Chronic constipation 10/12/2012  . Hot flashes 02/20/2012  . Hiatal hernia 02/20/2012  . Chronic pain of both hips 02/20/2012  . Fatigue 12/19/2011  . Acetabulum intrapelvic protrusion into pelvic region or thigh 05/15/2011  . Annual physical exam 10/03/2010  . Hypertension 03/20/2010  . Vitamin D deficiency 08/20/2009  . Anxiety 05/18/2009  . Migraine headache 05/13/2006   Teena Irani, PTA/CLT [phone]  Teena Irani 10/03/2019, 1:45 PM  [place], [place], [address] Phone: [phone]   Fax:  [phone]  Name: [name] MRN: [id number] Date of Birth: 15-Apr-1976

## 2019-10-05 ENCOUNTER — Encounter (HOSPITAL_COMMUNITY): Payer: Self-pay | Admitting: Physical Therapy

## 2019-10-05 ENCOUNTER — Other Ambulatory Visit: Payer: Self-pay

## 2019-10-05 ENCOUNTER — Ambulatory Visit (HOSPITAL_COMMUNITY): Payer: BC Managed Care – PPO | Admitting: Occupational Therapy

## 2019-10-05 ENCOUNTER — Encounter (HOSPITAL_COMMUNITY): Payer: Self-pay | Admitting: Occupational Therapy

## 2019-10-05 ENCOUNTER — Ambulatory Visit (HOSPITAL_COMMUNITY): Payer: BC Managed Care – PPO | Admitting: Physical Therapy

## 2019-10-05 DIAGNOSIS — R41840 Attention and concentration deficit: Secondary | ICD-10-CM

## 2019-10-05 DIAGNOSIS — M545 Low back pain, unspecified: Secondary | ICD-10-CM

## 2019-10-05 DIAGNOSIS — M25551 Pain in right hip: Secondary | ICD-10-CM

## 2019-10-05 DIAGNOSIS — M6281 Muscle weakness (generalized): Secondary | ICD-10-CM | POA: Diagnosis not present

## 2019-10-05 DIAGNOSIS — R2689 Other abnormalities of gait and mobility: Secondary | ICD-10-CM

## 2019-10-05 DIAGNOSIS — R41844 Frontal lobe and executive function deficit: Secondary | ICD-10-CM | POA: Diagnosis not present

## 2019-10-05 DIAGNOSIS — M542 Cervicalgia: Secondary | ICD-10-CM | POA: Diagnosis not present

## 2019-10-05 DIAGNOSIS — M25552 Pain in left hip: Secondary | ICD-10-CM | POA: Diagnosis not present

## 2019-10-05 DIAGNOSIS — R41842 Visuospatial deficit: Secondary | ICD-10-CM | POA: Diagnosis not present

## 2019-10-05 DIAGNOSIS — R29898 Other symptoms and signs involving the musculoskeletal system: Secondary | ICD-10-CM | POA: Diagnosis not present

## 2019-10-05 NOTE — Therapy (Signed)
[place], [place], [address] Phone: [phone]   Fax:  [phone]  Occupational Therapy Treatment  Patient Details  Name: [name] MRN: [id number] Date of Birth: [birth year] No data recorded  Encounter Date: 10/05/2019  OT End of Session - 10/05/19 1408    Visit Number  5    Number of Visits  6    Date for OT Re-Evaluation  10/21/19   mini reassessment 10/07/19   Authorization Type  1) BCBS 2) Medicare A & B 3) Medicaid    Authorization Time Period  progress note before 10th visit    Authorization - Visit Number  5    Authorization - Number of Visits  10    OT Start Time  1117    OT Stop Time  1205    OT Time Calculation (min)  48 min    Activity Tolerance  Patient tolerated treatment well    Behavior During Therapy  WFL for tasks assessed/performed       Past Medical History:  Diagnosis Date  . Abdominal pain, epigastric 12/17/2015  . Anxiety    pt denies having dx of anxiety  . BCC (basal cell carcinoma of skin)    right foot   . Bunion, right foot   . Chronic cough   . Closed fibular fracture 10/2008   Left - Sustained 2/2 fall down stairs (same fall as tibular fracture). S/P internal fixation.  . Duodenitis    EGD (07/14/2011) showing chronic duodenitis, H.pyloir neg  . Elevated liver enzymes   . Endometriosis    S/P TAH and salpingo-oophorectomy, right  . Epigastric pain    Chronic, thought 2/2 gastritis, EGD (07/14/2011) showing chronic duodenitis, H.pyloir neg // Repeat EGD (09/2011) - esophagealring, sessible polyp in stomach body, hiatal hernia - rec ad probiotic, await bx  . Esophageal ring    s/p dilatation during EGD (07/2011) - Dr. Oneida Alar  . Fatty liver   . Gastritis    Thought due to chronic NSAID use 2/2 pain from her congenital hip abnormality  . Headache(784.0)    Migraines  . Hidradenitis suppurativa 05/2006  . Hip deformity, congenital    Protrusio acetabuli with  articular sclerosis and flattening of femoral heads. With chronic OA of hip  . History of migraine headaches    Typical symptoms - bright lights, unilateral (starting behind her left ear), throbbing .  Marland Kitchen Hot flashes   . Hypertension   . Internal hemorrhoids   . Osteoarthritis of hip   . Ovarian cancer ([place]) 1995  . Protrusio acetabuli    diagnosed at age 51  . Sessile colonic polyp    noted 09/2011 -- > rec colonoscopy in 10 years, 2023  . Surgical menopause 08/2007   On HRT. Occuring since 01/09 following TAH and R-salpingo-oophorectomy   . Tibial fracture 10/2008   Left - Sustained 2/2 fall down stairs. Patient is S/P closed internal fixation with Smith Nephew tibial nail locked proximal and distal  . Vitamin D deficiency 11/2009   Vit D level 15 in 11/2009    Past Surgical History:  Procedure Laterality Date  . ABDOMINAL HYSTERECTOMY    . CESAREAN SECTION  08/2005   Primary low transverse  . COLONOSCOPY  09/22/2011   [id number] polyp in the rectum/Internal hemorrhoids, benign path  . ESOPHAGOGASTRODUODENOSCOPY  09/22/2011   [id number], esophageal/Sessile polyp in the body of the stomach/Hiatal hernia/ABDOMINAL PAIN LIKELY FUNCTIONAL V. POST-INFECTIOUS IBS, path: mild chronic  gastritis  . FRACTURE SURGERY     Right hip  . JOINT REPLACEMENT     right hip  . LAPAROSCOPY  10/2000   Operative laparoscopy with lysis of right adnexal adhesions and uterointestinal adhesions  . left tib fib    . OTHER SURGICAL HISTORY  02/2009   Closed treatment internal fixation, left tibia with Tamala Julian and Nephew tibial nail locked proximal and distal  . TOTAL ABDOMINAL HYSTERECTOMY W/ BILATERAL SALPINGOOPHORECTOMY  08/2007   for endometriosis. With concurrent right salpingo-oophorectomy  (2009), left oophorectomy (1995)  . TUBAL LIGATION  08/2005   Right tubal ligation    There were no vitals filed for this visit.  Subjective Assessment - 10/05/19 1115    Subjective   S: I like the sleep stories  app.    Currently in Pain?  Yes    Pain Score  5     Pain Location  Back    Pain Orientation  Lower;Posterior    Pain Descriptors / Indicators  Aching    Pain Type  Chronic pain    Pain Radiating Towards  bilateral hips    Pain Onset  More than a month ago    Pain Frequency  Constant    Aggravating Factors   laying flat, sitting    Pain Relieving Factors  massage, heat    Effect of Pain on Daily Activities  mod to max effect on ADLs    Multiple Pain Sites  No         [facility] OT Assessment - 10/05/19 1114      Assessment   Medical Diagnosis  Post Concussion Syndrome      Precautions   Precautions  Fall               OT Treatments/Exercises (OP) - 10/05/19 1141      ADLs   ADL Comments  Followed up on sleep hygiene strategies and water intake. Pt reporting she is using the sleep stories and deep breathing and it is helping with her sleep and stress. Today's session continuing with stress management. Reviewed stress level worksheets from previous session, pt scoring in the high stress level range with both worksheets. Introduced chair yoga with deep breathing component this session as a stress management strategy. Pt participating in seated breathing, hip circles, modified cow pose, modified cat pose, modified seated sun salutations, and side twist. Exercises modified for pts cervical neck and shoulder ROM. Pt completing each exercise 5x working on inhale and exhale with each pose.  Pt reports feeling very relaxed and almost sleepy during chair yoga exercises. Attempted chair aerobic exercises however pt unable to complete due to shoulder and cervical neck pain, as well as pain in back with LB movements. Reviewed "67 Proven Stress Reducers" handout and provided "100 Stress Reducer" activities page.                OT Short Term Goals - 09/16/19 1001      OT SHORT TERM GOAL #1   Title  Pt will report improved sleep by successfully utilizing sleep hygiene education and  routine developed during therapy.    Time  6    Period  Weeks    Status  On-going    Target Date  10/21/19      OT SHORT TERM GOAL #2   Title  Pt will be report decreased stress and anxiety levels by verbalizing at least 3 sucessful stress management techniques utilized during times of increased stress for  calm and focus.    Time  6    Period  Weeks    Status  On-going      OT SHORT TERM GOAL #3   Title  Pt will improve ADL and I/ADL performance by reporting use of at least 2 cognitive strategies for planning and time management.    Time  6    Period  Weeks    Status  On-going      OT SHORT TERM GOAL #4   Title  Pt will be educated on and verbalize use of at least 2 energy conservation techniques to increase energy and decrease fatigue during the day.    Time  6    Period  Weeks    Status  On-going      OT SHORT TERM GOAL #5   Title  Pt will report improvement in ability to engage in family movie and game nights by utilizing utilizing sensory processing strategies to decrease "brain jitters" and anxiety/overwhelmed feeling.    Time  6    Period  Weeks    Status  On-going      OT SHORT TERM GOAL #6   Title  Pt will improve ability to fix a simple meal or bake preferred items by utilizing compensatory strategies for short term memory deficits.    Time  6    Period  Weeks    Status  On-going      OT SHORT TERM GOAL #7   Title  Pt will be educated on and utilize compensatory strategies for visual impairments (including visual-perceptual issues) to improve ability to read.    Time  6    Period  Weeks    Status  On-going               Plan - 10/05/19 1409    Clinical Impression Statement  A: Pt reports she has been continuing use of sleep stories and deep breathing for relaxation and sleep, also reports BP has been lower with deep breathing techniques. Reviewed stress symptom worksheets and completed chair yoga activity today. Pt reports she has been interested in yoga  in the past however had not taken any classes yet. Modified poses as needed for pain. Pt reports success with relaxation after activity. Provided handouts for pt's review as well.    Cognitive Skills  Attention;Thought;Emotional;Energy/Drive;Memory;Problem Solve;Safety Awareness;Sequencing    Psychosocial Skills  Coping Strategies;Environmental  Adaptations;Habits;Interpersonal Interaction;Routines and Behaviors    Plan  P: Follow up on chair yoga at home. Follow up with supine stretches for relaxation and begin working on strategies for planning and time mangagement       Patient will benefit from skilled therapeutic intervention in order to improve the following deficits and impairments:     Cognitive Skills: Attention, Thought, Emotional, Energy/Drive, Memory, Problem Solve, Safety Awareness, Sequencing Psychosocial Skills: Coping Strategies, Environmental  Adaptations, Habits, Interpersonal Interaction, Routines and Behaviors   Visit Diagnosis: Frontal lobe and executive function deficit  Attention and concentration deficit    Problem List Patient Active Problem List   Diagnosis Date Noted  . Concussion with no loss of consciousness 08/12/2018  . Acute pain of left shoulder 08/12/2018  . Low back pain 08/12/2018  . Allergic rhinitis 08/05/2018  . Arthritis 07/01/2018  . DDD (degenerative disc disease), lumbar 05/21/2018  . Thoracic arthritis 05/21/2018  . DDD (degenerative disc disease), cervical 01/06/2018  . Hidradenitis suppurativa 10/11/2017  . Chronic frontal sinusitis 10/11/2017  . Fatty liver 10/11/2017  .  Chronic ethmoidal sinusitis 09/13/2017  . Photophobia of both eyes 09/13/2017  . Abnormal liver enzymes 09/13/2017  . History of hysterectomy with bilateral oophorectomy 06/24/2017  . Estrogen deficiency 06/24/2017  . Overweight 11/17/2016  . Abdominal pain 11/04/2016  . Headache 12/17/2015  . Chronic constipation 10/12/2012  . Hot flashes 02/20/2012  . Hiatal  hernia 02/20/2012  . Chronic pain of both hips 02/20/2012  . Fatigue 12/19/2011  . Acetabulum intrapelvic protrusion into pelvic region or thigh 05/15/2011  . Annual physical exam 10/03/2010  . Hypertension 03/20/2010  . Vitamin D deficiency 08/20/2009  . Anxiety 05/18/2009  . Migraine headache 05/13/2006   Guadelupe Sabin, OTR/L  [phone] 10/05/2019, 2:13 PM  [place] [address] [place], [place], [address] Phone: [phone]   Fax:  [phone]  Name: [name] MRN: [id number] Date of Birth: 1976-06-24

## 2019-10-05 NOTE — Therapy (Signed)
[facility] [address] [place], [place], [address] Phone: [phone]   Fax:  [phone]  Physical Therapy Treatment  Patient Details  Name: [name] MRN: [id number] Date of Birth: 04/14/1976 Referring Provider (PT): McLean-Scocuzza, Olivia Mackie; Melvenia Beam, MD   Encounter Date: 10/05/2019  PT End of Session - 10/05/19 1043    Visit Number  7    Number of Visits  12    Date for PT Re-Evaluation  10/20/19    Authorization Type  Primary: Medicare Secondary: BCBS Tertiary: Medicaid of [place]    Authorization Time Period  09/08/19- 10/20/19    Authorization - Visit Number  7    Authorization - Number of Visits  10    PT Start Time  [id number]   arrived late   PT Stop Time  1115    PT Time Calculation (min)  35 min    Activity Tolerance  Patient limited by pain;Patient limited by fatigue    Behavior During Therapy  [facility] for tasks assessed/performed       Past Medical History:  Diagnosis Date  . Abdominal pain, epigastric 12/17/2015  . Anxiety    pt denies having dx of anxiety  . BCC (basal cell carcinoma of skin)    right foot   . Bunion, right foot   . Chronic cough   . Closed fibular fracture 10/2008   Left - Sustained 2/2 fall down stairs (same fall as tibular fracture). S/P internal fixation.  . Duodenitis    EGD (07/14/2011) showing chronic duodenitis, H.pyloir neg  . Elevated liver enzymes   . Endometriosis    S/P TAH and salpingo-oophorectomy, right  . Epigastric pain    Chronic, thought 2/2 gastritis, EGD (07/14/2011) showing chronic duodenitis, H.pyloir neg // Repeat EGD (09/2011) - esophagealring, sessible polyp in stomach body, hiatal hernia - rec ad probiotic, await bx  . Esophageal ring    s/p dilatation during EGD (07/2011) - Dr. Oneida Alar  . Fatty liver   . Gastritis    Thought due to chronic NSAID use 2/2 pain from her congenital hip abnormality  . Headache(784.0)    Migraines  . Hidradenitis suppurativa 05/2006   . 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 With chronic OA of hip  . History of migraine headaches    Typical symptoms - bright lights, unilateral (starting behind her left ear), throbbing .  Marland Kitchen Hot flashes   . Hypertension   . Internal hemorrhoids   . Osteoarthritis of hip   . Ovarian cancer ([place]) 1995  . Protrusio acetabuli    diagnosed at age 84  . Sessile colonic polyp    noted 09/2011 -- > rec colonoscopy in 10 years, 2023  . Surgical menopause 08/2007   On HRT. Occuring since 01/09 following TAH and R-salpingo-oophorectomy   . Tibial fracture 10/2008   Left - Sustained 2/2 fall down stairs. Patient is S/P closed internal fixation with Smith Nephew tibial nail locked proximal and distal  . Vitamin D deficiency 11/2009   Vit D level 15 in 11/2009    Past Surgical History:  Procedure Laterality Date  . ABDOMINAL HYSTERECTOMY    . CESAREAN SECTION  08/2005   Primary low transverse  . COLONOSCOPY  09/22/2011   [id number] polyp in the rectum/Internal hemorrhoids, benign path  . ESOPHAGOGASTRODUODENOSCOPY  09/22/2011   [id number], esophageal/Sessile polyp in the body of the stomach/Hiatal hernia/ABDOMINAL PAIN LIKELY FUNCTIONAL V.  POST-INFECTIOUS IBS, path: mild chronic gastritis  . FRACTURE SURGERY     Right hip  . JOINT REPLACEMENT     right hip  . LAPAROSCOPY  10/2000   Operative laparoscopy with lysis of right adnexal adhesions and uterointestinal adhesions  . left tib fib    . OTHER SURGICAL HISTORY  02/2009   Closed treatment internal fixation, left tibia with Tamala Julian and Nephew tibial nail locked proximal and distal  . TOTAL ABDOMINAL HYSTERECTOMY W/ BILATERAL SALPINGOOPHORECTOMY  08/2007   for endometriosis. With concurrent right salpingo-oophorectomy  (2009), left oophorectomy (1995)  . TUBAL LIGATION  08/2005   Right tubal ligation    There were no vitals filed for this visit.  Subjective Assessment -  10/05/19 1050    Subjective  Patient says she is feeling better and having a good day today. Says her back is not bothering her quite as much. Patient says her neck has not been hurting as much as her neck the past few days. Patient says she has adjusted her bed to be more cradling for her posture and thinks this has helped, says she has been sleeping a little better. Patient says she thought about the dry needling discussion she had with prior therapist and would like to give it a try.    Currently in Pain?  Yes    Pain Score  5     Pain Location  Back    Pain Orientation  Posterior;Lower    Pain Type  Chronic pain    Pain Radiating Towards  bilateral hips    Pain Onset  More than a month ago    Pain Frequency  Constant                       [facility] Adult PT Treatment/Exercise - 10/05/19 0001      Lumbar Exercises: Stretches   Lower Trunk Rotation  5 reps;10 seconds      Lumbar Exercises: Seated   Sit to Stand  5 reps    Sit to Stand Limitations  from raised high mat, heavy use of UEs with RW, cues for quad and glute activation       Lumbar Exercises: Supine   Ab Set  3 seconds;15 reps    Bridge  10 reps    Other Supine Lumbar Exercises  diaphragmatic breathing/ belly breath x 10     Other Supine Lumbar Exercises  bent knee fallout (attemped but could not do per ROM limit)               PT Short Term Goals - 09/08/19 1323      PT SHORT TERM GOAL #1   Title  Patient will be independant with initial HEP to improve functional outcomes    Time  3    Period  Weeks    Status  Achieved    Target Date  09/29/19      PT SHORT TERM GOAL #2   Title  Patient will report less than 5/10 pain globally, on average, at rest, to improve daily function    Time  3    Period  Weeks    Status  On-going    Target Date  09/29/19        PT Long Term Goals - 09/08/19 1324      PT LONG TERM GOAL #1   Title  Patient will report at least 50% improvement in cervical  symptoms for improved quality of life.  Time  6    Period  Weeks    Status  New    Target Date  10/20/19      PT LONG TERM GOAL #2   Title  Patient will improve cervical spine ROM in all planes by 50% in order to be able to drive safely.    Time  6    Period  Weeks    Status  New    Target Date  10/20/19      PT LONG TERM GOAL #3   Title  Patient will experience neck/headache pain less than 3/10 in order to improve sleep quality.    Time  6    Period  Weeks    Status  New    Target Date  10/20/19            Plan - 10/05/19 1118    Clinical Impression Statement  Patient demos slow labored movement wit bed mobility and sit to supine transfer. Required use of UEs to assist legs to supine position. Patient noted pain with supine ab setting. Instructed to contract less, which reduced pain. Patient educated on possible benefit of dry needling trail for chronic pain sx. Attempted bent knee fall outs, but patient was unable to externally rotate at hip beyond approx. 5-10 degrees. Patient also limited with LTR mobility, but was able to achieve greater hip movement. Patient required verbal cues for technique and improved efficiency with weight shifting and LE placement during supine to sit transfer. Patient continues to have significant difficulty with sit to stand transfer, and relies on heavy use on BUEs to initiate movement, even from raised surface. Patient educated on continued LAQs with HEP to build leg strength.    Personal Factors and Comorbidities  Comorbidity 2;Past/Current Experience;Time since onset of injury/illness/exacerbation    Comorbidities  Globalized pain in multiple sites    Examination-Activity Limitations  Bed Mobility;Sleep;Bend;Squat;Bathing;Sit;Carry;Stand;Dressing;Transfers;Lift;Locomotion Level;Stairs;Reach Overhead    Examination-Participation Restrictions  Cleaning;Community Activity;Dorita Sciara    Stability/Clinical Decision Making  Evolving/Moderate  complexity    Rehab Potential  Fair    PT Frequency  2x / week    PT Duration  6 weeks    PT Treatment/Interventions  ADLs/Self Care Home Management;DME Instruction;Biofeedback;Gait training;Stair training;Cryotherapy;Electrical Stimulation;Functional mobility training;Therapeutic activities;Moist Heat;Therapeutic exercise;Balance training;Manual techniques;Taping;Wheelchair mobility training;Orthotic Fit/Training;Patient/family education;Energy conservation;Joint Manipulations;Spinal Manipulations;Passive range of motion;Neuromuscular re-education;Aquatic Therapy;Traction;Ultrasound;Splinting;Vestibular    PT Next Visit Plan  Contiue to progress LE strengthening, gait with LRAD and normalized movement.    PT Home Exercise Plan  2/3 deep breathing and body scan  10/03/19: LAQ       Patient will benefit from skilled therapeutic intervention in order to improve the following deficits and impairments:  Abnormal gait, Decreased endurance, Decreased strength, Decreased activity tolerance, Decreased balance, Decreased mobility, Difficulty walking, Pain, Decreased range of motion, Impaired flexibility, Improper body mechanics, Postural dysfunction, Dizziness, Increased fascial restricitons, Increased muscle spasms, Impaired perceived functional ability  Visit Diagnosis: Muscle weakness (generalized)  Acute left-sided low back pain without sciatica  Other abnormalities of gait and mobility  Pain in right hip     Problem List Patient Active Problem List   Diagnosis Date Noted  . Concussion with no loss of consciousness 08/12/2018  . Acute pain of left shoulder 08/12/2018  . Low back pain 08/12/2018  . Allergic rhinitis 08/05/2018  . Arthritis 07/01/2018  . DDD (degenerative disc disease), lumbar 05/21/2018  . Thoracic arthritis 05/21/2018  . DDD (degenerative disc disease), cervical 01/06/2018  . Hidradenitis suppurativa 10/11/2017  .  Chronic frontal sinusitis 10/11/2017  . Fatty liver  10/11/2017  . Chronic ethmoidal sinusitis 09/13/2017  . Photophobia of both eyes 09/13/2017  . Abnormal liver enzymes 09/13/2017  . History of hysterectomy with bilateral oophorectomy 06/24/2017  . Estrogen deficiency 06/24/2017  . Overweight 11/17/2016  . Abdominal pain 11/04/2016  . Headache 12/17/2015  . Chronic constipation 10/12/2012  . Hot flashes 02/20/2012  . Hiatal hernia 02/20/2012  . Chronic pain of both hips 02/20/2012  . Fatigue 12/19/2011  . Acetabulum intrapelvic protrusion into pelvic region or thigh 05/15/2011  . Annual physical exam 10/03/2010  . Hypertension 03/20/2010  . Vitamin D deficiency 08/20/2009  . Anxiety 05/18/2009  . Migraine headache 05/13/2006   11:21 AM, 10/05/19 Josue Hector PT DPT  Physical Therapist with [facility]  [phone] [address] [place], [place], [address] Phone: [phone]   Fax:  [phone]  Name: [name] MRN: [id number] Date of Birth: 03-Jan-1976

## 2019-10-08 DIAGNOSIS — M47816 Spondylosis without myelopathy or radiculopathy, lumbar region: Secondary | ICD-10-CM | POA: Diagnosis not present

## 2019-10-08 DIAGNOSIS — M545 Low back pain: Secondary | ICD-10-CM | POA: Diagnosis not present

## 2019-10-10 ENCOUNTER — Ambulatory Visit (HOSPITAL_COMMUNITY): Payer: BC Managed Care – PPO | Admitting: Physical Therapy

## 2019-10-10 DIAGNOSIS — M47816 Spondylosis without myelopathy or radiculopathy, lumbar region: Secondary | ICD-10-CM | POA: Diagnosis not present

## 2019-10-10 DIAGNOSIS — M545 Low back pain: Secondary | ICD-10-CM | POA: Diagnosis not present

## 2019-10-10 DIAGNOSIS — M7918 Myalgia, other site: Secondary | ICD-10-CM | POA: Diagnosis not present

## 2019-10-10 DIAGNOSIS — Z96642 Presence of left artificial hip joint: Secondary | ICD-10-CM | POA: Diagnosis not present

## 2019-10-11 ENCOUNTER — Ambulatory Visit (HOSPITAL_COMMUNITY): Payer: BC Managed Care – PPO | Attending: Neurology | Admitting: Physical Therapy

## 2019-10-11 ENCOUNTER — Other Ambulatory Visit: Payer: Self-pay

## 2019-10-11 ENCOUNTER — Encounter (HOSPITAL_COMMUNITY): Payer: Self-pay | Admitting: Physical Therapy

## 2019-10-11 DIAGNOSIS — M542 Cervicalgia: Secondary | ICD-10-CM | POA: Insufficient documentation

## 2019-10-11 DIAGNOSIS — M6281 Muscle weakness (generalized): Secondary | ICD-10-CM | POA: Insufficient documentation

## 2019-10-11 DIAGNOSIS — R2689 Other abnormalities of gait and mobility: Secondary | ICD-10-CM

## 2019-10-11 DIAGNOSIS — R41844 Frontal lobe and executive function deficit: Secondary | ICD-10-CM | POA: Diagnosis not present

## 2019-10-11 DIAGNOSIS — R41840 Attention and concentration deficit: Secondary | ICD-10-CM | POA: Diagnosis not present

## 2019-10-11 DIAGNOSIS — M545 Low back pain, unspecified: Secondary | ICD-10-CM

## 2019-10-11 DIAGNOSIS — M25551 Pain in right hip: Secondary | ICD-10-CM | POA: Diagnosis not present

## 2019-10-11 NOTE — Therapy (Signed)
[facility] [address] [place], [place], [address] Phone: [phone]   Fax:  [phone]  Physical Therapy Treatment  Patient Details  Name: [name] MRN: [id number] Date of Birth: [birth year] Referring Provider (PT): McLean-Scocuzza, Olivia Mackie; Melvenia Beam, MD   Encounter Date: 10/11/2019  PT End of Session - 10/11/19 1052    Visit Number  8    Number of Visits  12    Date for PT Re-Evaluation  10/20/19    Authorization Type  Primary: Medicare Secondary: BCBS Tertiary: Medicaid of     Authorization Time Period  09/08/19- 10/20/19    Authorization - Visit Number  8    Authorization - Number of Visits  10    PT Start Time  [id number]   patient 8 minutes late to session   PT Stop Time  1125    PT Time Calculation (min)  32 min    Activity Tolerance  Patient limited by pain;Patient limited by fatigue    Behavior During Therapy  [facility] for tasks assessed/performed       Past Medical History:  Diagnosis Date  . Abdominal pain, epigastric 12/17/2015  . Anxiety    pt denies having dx of anxiety  . BCC (basal cell carcinoma of skin)    right foot   . Bunion, right foot   . Chronic cough   . Closed fibular fracture 10/2008   Left - Sustained 2/2 fall down stairs (same fall as tibular fracture). S/P internal fixation.  . Duodenitis    EGD (07/14/2011) showing chronic duodenitis, H.pyloir neg  . Elevated liver enzymes   . Endometriosis    S/P TAH and salpingo-oophorectomy, right  . Epigastric pain    Chronic, thought 2/2 gastritis, EGD (07/14/2011) showing chronic duodenitis, H.pyloir neg // Repeat EGD (09/2011) - esophagealring, sessible polyp in stomach body, hiatal hernia - rec ad probiotic, await bx  . Esophageal ring    s/p dilatation during EGD (07/2011) - Dr. Oneida Alar  . Fatty liver   . Gastritis    Thought due to chronic NSAID use 2/2 pain from her congenital hip abnormality  . Headache(784.0)    Migraines  . Hidradenitis  suppurativa 05/2006  . 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 With chronic OA of hip  . History of migraine headaches    Typical symptoms - bright lights, unilateral (starting behind her left ear), throbbing .  Marland Kitchen Hot flashes   . Hypertension   . Internal hemorrhoids   . Osteoarthritis of hip   . Ovarian cancer ([place]) 1995  . Protrusio acetabuli    diagnosed at age 47  . Sessile colonic polyp    noted 09/2011 -- > rec colonoscopy in 10 years, 2023  . Surgical menopause 08/2007   On HRT. Occuring since 01/09 following TAH and R-salpingo-oophorectomy   . Tibial fracture 10/2008   Left - Sustained 2/2 fall down stairs. Patient is S/P closed internal fixation with Smith Nephew tibial nail locked proximal and distal  . Vitamin D deficiency 11/2009   Vit D level 15 in 11/2009    Past Surgical History:  Procedure Laterality Date  . ABDOMINAL HYSTERECTOMY    . CESAREAN SECTION  08/2005   Primary low transverse  . COLONOSCOPY  09/22/2011   [id number] polyp in the rectum/Internal hemorrhoids, benign path  . ESOPHAGOGASTRODUODENOSCOPY  09/22/2011   [id number], esophageal/Sessile polyp in the body of the stomach/Hiatal hernia/ABDOMINAL  PAIN LIKELY FUNCTIONAL V. POST-INFECTIOUS IBS, path: mild chronic gastritis  . FRACTURE SURGERY     Right hip  . JOINT REPLACEMENT     right hip  . LAPAROSCOPY  10/2000   Operative laparoscopy with lysis of right adnexal adhesions and uterointestinal adhesions  . left tib fib    . OTHER SURGICAL HISTORY  02/2009   Closed treatment internal fixation, left tibia with Tamala Julian and Nephew tibial nail locked proximal and distal  . TOTAL ABDOMINAL HYSTERECTOMY W/ BILATERAL SALPINGOOPHORECTOMY  08/2007   for endometriosis. With concurrent right salpingo-oophorectomy  (2009), left oophorectomy (1995)  . TUBAL LIGATION  08/2005   Right tubal ligation    There were no vitals filed for this  visit.  Subjective Assessment - 10/11/19 1054    Subjective  States she saw the MD yesteday and she now needs to see a pain specialist. States that her headaches are better has been phasing out over the last couple of days. She did try the tramadol and that seemed to help. Back pain is still present and she is having upper mid back pain on friday and this lasted a good bit.    Pain Onset  More than a month ago                       [facility] Adult PT Treatment/Exercise - 10/11/19 0001      Lumbar Exercises: Prone   Other Prone Lumbar Exercises  knee flexion - Initial active bilaterally was about 30 degrees with significant leg shaking. PROM of knee flexion able to get to 90 degrees. Good control with eccentric lower bilateral with no reports of weakness/pain/leg shaking. Transitioned to [facility] - patient able to bend knees to 90 degrees with PT assistance. Patient also able to bend knees to 90 degrees with tactile cues but no assist from PT. Multiple reps completed of each. Started with right leg then performed on left leg. With return to Right leg, patient unable to get to 90 degrees passively secondary to quad activation at about 30 degrees of knee bending.              PT Education - 10/11/19 1316    Education Details  re-educated patient on dry needling, precautions and implications of dry needling. Answered all questions on this date    Person(s) Educated  Patient    Methods  Explanation    Comprehension  Verbalized understanding       PT Short Term Goals - 09/08/19 1323      PT SHORT TERM GOAL #1   Title  Patient will be independant with initial HEP to improve functional outcomes    Time  3    Period  Weeks    Status  Achieved    Target Date  09/29/19      PT SHORT TERM GOAL #2   Title  Patient will report less than 5/10 pain globally, on average, at rest, to improve daily function    Time  3    Period  Weeks    Status  On-going    Target Date  09/29/19         PT Long Term Goals - 09/08/19 1324      PT LONG TERM GOAL #1   Title  Patient will report at least 50% improvement in cervical symptoms for improved quality of life.    Time  6    Period  Weeks    Status  New    Target Date  10/20/19      PT LONG TERM GOAL #2   Title  Patient will improve cervical spine ROM in all planes by 50% in order to be able to drive safely.    Time  6    Period  Weeks    Status  New    Target Date  10/20/19      PT LONG TERM GOAL #3   Title  Patient will experience neck/headache pain less than 3/10 in order to improve sleep quality.    Time  6    Period  Weeks    Status  New    Target Date  10/20/19            Plan - 10/11/19 1333    Clinical Impression Statement  Focused on re-education of dry needling and answered all questions about it. Patietn still concerned as intervention uses needles and can be painful. Patient denied dry needling treatment on this date. Instead focused on knee mobility and stretching. Inconsistent findings with knee ROM exercises. Patient presents with excellent control with eccentric strength, but poor concentric and passive ROM tolerance. Initially patient presented with minimal difficult transitioning from sit to stand in waiting room, but increased difficulties noted in treatment room even with elevated surface to stand up from. Will continue to assess and monitor symptoms to determine route cause of current functional limitations and pain presentation. Will perform dry needling as appropriate and when/if patient is interested in receiving this specific treatment.    Personal Factors and Comorbidities  Comorbidity 2;Past/Current Experience;Time since onset of injury/illness/exacerbation    Comorbidities  Globalized pain in multiple sites    Examination-Activity Limitations  Bed Mobility;Sleep;Bend;Squat;Bathing;Sit;Carry;Stand;Dressing;Transfers;Lift;Locomotion Level;Stairs;Reach Overhead    Examination-Participation  Restrictions  Cleaning;Community Activity;Dorita Sciara    Stability/Clinical Decision Making  Evolving/Moderate complexity    Rehab Potential  Fair    PT Frequency  2x / week    PT Duration  6 weeks    PT Treatment/Interventions  ADLs/Self Care Home Management;DME Instruction;Biofeedback;Gait training;Stair training;Cryotherapy;Electrical Stimulation;Functional mobility training;Therapeutic activities;Moist Heat;Therapeutic exercise;Balance training;Manual techniques;Taping;Wheelchair mobility training;Orthotic Fit/Training;Patient/family education;Energy conservation;Joint Manipulations;Spinal Manipulations;Passive range of motion;Neuromuscular re-education;Aquatic Therapy;Traction;Ultrasound;Splinting;Vestibular    PT Next Visit Plan  Contiue to progress LE strengthening, gait with LRAD and normalized movement.    PT Home Exercise Plan  2/3 deep breathing and body scan  10/03/19: LAQ       Patient will benefit from skilled therapeutic intervention in order to improve the following deficits and impairments:  Abnormal gait, Decreased endurance, Decreased strength, Decreased activity tolerance, Decreased balance, Decreased mobility, Difficulty walking, Pain, Decreased range of motion, Impaired flexibility, Improper body mechanics, Postural dysfunction, Dizziness, Increased fascial restricitons, Increased muscle spasms, Impaired perceived functional ability  Visit Diagnosis: Muscle weakness (generalized)  Acute left-sided low back pain without sciatica  Other abnormalities of gait and mobility  Pain in right hip     Problem List Patient Active Problem List   Diagnosis Date Noted  . Concussion with no loss of consciousness 08/12/2018  . Acute pain of left shoulder 08/12/2018  . Low back pain 08/12/2018  . Allergic rhinitis 08/05/2018  . Arthritis 07/01/2018  . DDD (degenerative disc disease), lumbar 05/21/2018  . Thoracic arthritis 05/21/2018  . DDD (degenerative disc disease),  cervical 01/06/2018  . Hidradenitis suppurativa 10/11/2017  . Chronic frontal sinusitis 10/11/2017  . Fatty liver 10/11/2017  . Chronic ethmoidal sinusitis 09/13/2017  . Photophobia of both eyes 09/13/2017  . Abnormal liver enzymes  09/13/2017  . History of hysterectomy with bilateral oophorectomy 06/24/2017  . Estrogen deficiency 06/24/2017  . Overweight 11/17/2016  . Abdominal pain 11/04/2016  . Headache 12/17/2015  . Chronic constipation 10/12/2012  . Hot flashes 02/20/2012  . Hiatal hernia 02/20/2012  . Chronic pain of both hips 02/20/2012  . Fatigue 12/19/2011  . Acetabulum intrapelvic protrusion into pelvic region or thigh 05/15/2011  . Annual physical exam 10/03/2010  . Hypertension 03/20/2010  . Vitamin D deficiency 08/20/2009  . Anxiety 05/18/2009  . Migraine headache 05/13/2006   1:34 PM, 10/11/19 Jerene Pitch, DPT Physical Therapy with [facility]  [phone] office  [facility] [address] [place], [place], [address] Phone: [phone]   Fax:  [phone]  Name: [name] MRN: [id number] Date of Birth: 01/29/1976

## 2019-10-12 ENCOUNTER — Encounter (HOSPITAL_COMMUNITY): Payer: Self-pay | Admitting: Occupational Therapy

## 2019-10-12 ENCOUNTER — Encounter (HOSPITAL_COMMUNITY): Payer: Self-pay | Admitting: Physical Therapy

## 2019-10-12 ENCOUNTER — Other Ambulatory Visit: Payer: Self-pay

## 2019-10-12 ENCOUNTER — Ambulatory Visit (HOSPITAL_COMMUNITY): Payer: BC Managed Care – PPO | Admitting: Occupational Therapy

## 2019-10-12 ENCOUNTER — Ambulatory Visit (HOSPITAL_COMMUNITY): Payer: BC Managed Care – PPO | Admitting: Physical Therapy

## 2019-10-12 DIAGNOSIS — M542 Cervicalgia: Secondary | ICD-10-CM | POA: Diagnosis not present

## 2019-10-12 DIAGNOSIS — R41840 Attention and concentration deficit: Secondary | ICD-10-CM | POA: Diagnosis not present

## 2019-10-12 DIAGNOSIS — R2689 Other abnormalities of gait and mobility: Secondary | ICD-10-CM | POA: Diagnosis not present

## 2019-10-12 DIAGNOSIS — M6281 Muscle weakness (generalized): Secondary | ICD-10-CM | POA: Diagnosis not present

## 2019-10-12 DIAGNOSIS — R41844 Frontal lobe and executive function deficit: Secondary | ICD-10-CM | POA: Diagnosis not present

## 2019-10-12 DIAGNOSIS — M545 Low back pain, unspecified: Secondary | ICD-10-CM

## 2019-10-12 DIAGNOSIS — M25551 Pain in right hip: Secondary | ICD-10-CM

## 2019-10-12 NOTE — Therapy (Signed)
[facility] [place], [place], [address] Phone: [phone]   Fax:  [phone]  Occupational Therapy Treatment  Patient Details  Name: [name] MRN: [id number] Date of Birth: 01-Jun-1976 No data recorded  Encounter Date: 10/12/2019  OT End of Session - 10/12/19 1254    Visit Number  6    Number of Visits  7    Date for OT Re-Evaluation  10/21/19     Authorization Type  1) BCBS 2) Medicare A & B 3) Medicaid    Authorization Time Period  progress note before 10th visit    Authorization - Visit Number  6    Authorization - Number of Visits  10    Progress Note Due on Visit  10    OT Start Time  1115    OT Stop Time  1200    OT Time Calculation (min)  45 min    Activity Tolerance  Patient tolerated treatment well    Behavior During Therapy  [facility] for tasks assessed/performed       Past Medical History:  Diagnosis Date  . Abdominal pain, epigastric 12/17/2015  . Anxiety    pt denies having dx of anxiety  . BCC (basal cell carcinoma of skin)    right foot   . Bunion, right foot   . Chronic cough   . Closed fibular fracture 10/2008   Left - Sustained 2/2 fall down stairs (same fall as tibular fracture). S/P internal fixation.  . Duodenitis    EGD (07/14/2011) showing chronic duodenitis, H.pyloir neg  . Elevated liver enzymes   . Endometriosis    S/P TAH and salpingo-oophorectomy, right  . Epigastric pain    Chronic, thought 2/2 gastritis, EGD (07/14/2011) showing chronic duodenitis, H.pyloir neg // Repeat EGD (09/2011) - esophagealring, sessible polyp in stomach body, hiatal hernia - rec ad probiotic, await bx  . Esophageal ring    s/p dilatation during EGD (07/2011) - Dr. Oneida Alar  . Fatty liver   . Gastritis    Thought due to chronic NSAID use 2/2 pain from her congenital hip abnormality  . Headache(784.0)    Migraines  . Hidradenitis suppurativa 05/2006  . Hip deformity, congenital    Protrusio acetabuli  with articular sclerosis and flattening of femoral heads. With chronic OA of hip  . History of migraine headaches    Typical symptoms - bright lights, unilateral (starting behind her left ear), throbbing .  Marland Kitchen Hot flashes   . Hypertension   . Internal hemorrhoids   . Osteoarthritis of hip   . Ovarian cancer ([place]) 1995  . Protrusio acetabuli    diagnosed at age 9  . Sessile colonic polyp    noted 09/2011 -- > rec colonoscopy in 10 years, 2023  . Surgical menopause 08/2007   On HRT. Occuring since 01/09 following TAH and R-salpingo-oophorectomy   . Tibial fracture 10/2008   Left - Sustained 2/2 fall down stairs. Patient is S/P closed internal fixation with Smith Nephew tibial nail locked proximal and distal  . Vitamin D deficiency 11/2009   Vit D level 15 in 11/2009    Past Surgical History:  Procedure Laterality Date  . ABDOMINAL HYSTERECTOMY    . CESAREAN SECTION  08/2005   Primary low transverse  . COLONOSCOPY  09/22/2011   [id number] polyp in the rectum/Internal hemorrhoids, benign path  . ESOPHAGOGASTRODUODENOSCOPY  09/22/2011   [id number], esophageal/Sessile polyp in the body of the stomach/Hiatal hernia/ABDOMINAL PAIN LIKELY  FUNCTIONAL V. POST-INFECTIOUS IBS, path: mild chronic gastritis  . FRACTURE SURGERY     Right hip  . JOINT REPLACEMENT     right hip  . LAPAROSCOPY  10/2000   Operative laparoscopy with lysis of right adnexal adhesions and uterointestinal adhesions  . left tib fib    . OTHER SURGICAL HISTORY  02/2009   Closed treatment internal fixation, left tibia with Tamala Julian and Nephew tibial nail locked proximal and distal  . TOTAL ABDOMINAL HYSTERECTOMY W/ BILATERAL SALPINGOOPHORECTOMY  08/2007   for endometriosis. With concurrent right salpingo-oophorectomy  (2009), left oophorectomy (1995)  . TUBAL LIGATION  08/2005   Right tubal ligation    There were no vitals filed for this visit.  Subjective Assessment - 10/12/19 1108    Subjective   S: I'm sleep so much  better now.    Currently in Pain?  Yes    Pain Score  7     Pain Location  Shoulder    Pain Orientation  Right;Left    Pain Descriptors / Indicators  Aching;Sore    Pain Type  Acute pain    Pain Radiating Towards  both shoulder blades    Pain Onset  Today    Pain Frequency  Constant    Aggravating Factors   stretching too far    Pain Relieving Factors  massage, rest, heat    Effect of Pain on Daily Activities  mod effect on ADLs    Multiple Pain Sites  No         [facility] OT Assessment - 10/12/19 1108      Assessment   Medical Diagnosis  Post Concussion Syndrome      Precautions   Precautions  Fall               OT Treatments/Exercises (OP) - 10/12/19 1118      Cognitive Exercises   Other Cognitive Exercises 1  Time Management: Pt completed protective factors worksheet and identified areas of weakness and strength in the 6 factors listed. Pt identified physical health as the most important factor. She identified social support and physical health as areas needing impovement. Pt listed one way each identified area would improve her health with adjustments. She then listed one specific strategy for each area to begin improving her strength in that area. Pt then worked on planning her week using a printed weekly calendar. Pt identifed tasks that she felt needed to be done daily and then identified tasks that could be done a couple times a week and then wrote them down on the days that she wanted to complete them. Problem solved for grocery shopping and educated on grocery pick up. Also educated on pt going to store with her husband and picking out the produce, then waiting for him to get the other items in the store if she is fatigued. Encouraged pt to choose a time when she has the most energy versus attempting to shop when she is tired.                OT Short Term Goals - 09/16/19 1001      OT SHORT TERM GOAL #1   Title  Pt will report improved sleep by successfully  utilizing sleep hygiene education and routine developed during therapy.    Time  6    Period  Weeks    Status  On-going    Target Date  10/21/19      OT SHORT TERM GOAL #2  Title  Pt will be report decreased stress and anxiety levels by verbalizing at least 3 sucessful stress management techniques utilized during times of increased stress for calm and focus.    Time  6    Period  Weeks    Status  On-going      OT SHORT TERM GOAL #3   Title  Pt will improve ADL and I/ADL performance by reporting use of at least 2 cognitive strategies for planning and time management.    Time  6    Period  Weeks    Status  On-going      OT SHORT TERM GOAL #4   Title  Pt will be educated on and verbalize use of at least 2 energy conservation techniques to increase energy and decrease fatigue during the day.    Time  6    Period  Weeks    Status  On-going      OT SHORT TERM GOAL #5   Title  Pt will report improvement in ability to engage in family movie and game nights by utilizing utilizing sensory processing strategies to decrease "brain jitters" and anxiety/overwhelmed feeling.    Time  6    Period  Weeks    Status  On-going      OT SHORT TERM GOAL #6   Title  Pt will improve ability to fix a simple meal or bake preferred items by utilizing compensatory strategies for short term memory deficits.    Time  6    Period  Weeks    Status  On-going      OT SHORT TERM GOAL #7   Title  Pt will be educated on and utilize compensatory strategies for visual impairments (including visual-perceptual issues) to improve ability to read.    Time  6    Period  Weeks    Status  On-going               Plan - 10/12/19 1254    Clinical Impression Statement  A: Pt reports her sleep is continuing to improve and her blood pressure is coming down, has not completed any chair yoga since previous session. Today's session focusing on planning and time management, specifically working towards improving the  areas she identifies as weak on her protective factors worksheet. Pt feels that she needs to do all housework every day, therefore created a calendar and pt planned out her week for housework tasks in a way that would limit fatigue. Also problem-solved for grocery shopping, an area that pt feels is affecting her digestion due to going from a mostly raw diet to more processed foods now that her husband does the majority of the shopping.    Cognitive Skills  Attention;Thought;Emotional;Energy/Drive;Memory;Problem Solve;Safety Awareness;Sequencing    Psychosocial Skills  Coping Strategies;Environmental  Adaptations;Habits;Interpersonal Interaction;Routines and Behaviors    Plan  P: Follow up on following weekly calender. Review energy conservation strategies. Reassess & determine if ready for discharge       Patient will benefit from skilled therapeutic intervention in order to improve the following deficits and impairments:     Cognitive Skills: Attention, Thought, Emotional, Energy/Drive, Memory, Problem Solve, Safety Awareness, Sequencing Psychosocial Skills: Coping Strategies, Environmental  Adaptations, Habits, Interpersonal Interaction, Routines and Behaviors   Visit Diagnosis: Frontal lobe and executive function deficit  Attention and concentration deficit    Problem List Patient Active Problem List   Diagnosis Date Noted  . Concussion with no loss of consciousness 08/12/2018  . Acute  pain of left shoulder 08/12/2018  . Low back pain 08/12/2018  . Allergic rhinitis 08/05/2018  . Arthritis 07/01/2018  . DDD (degenerative disc disease), lumbar 05/21/2018  . Thoracic arthritis 05/21/2018  . DDD (degenerative disc disease), cervical 01/06/2018  . Hidradenitis suppurativa 10/11/2017  . Chronic frontal sinusitis 10/11/2017  . Fatty liver 10/11/2017  . Chronic ethmoidal sinusitis 09/13/2017  . Photophobia of both eyes 09/13/2017  . Abnormal liver enzymes 09/13/2017  . History of  hysterectomy with bilateral oophorectomy 06/24/2017  . Estrogen deficiency 06/24/2017  . Overweight 11/17/2016  . Abdominal pain 11/04/2016  . Headache 12/17/2015  . Chronic constipation 10/12/2012  . Hot flashes 02/20/2012  . Hiatal hernia 02/20/2012  . Chronic pain of both hips 02/20/2012  . Fatigue 12/19/2011  . Acetabulum intrapelvic protrusion into pelvic region or thigh 05/15/2011  . Annual physical exam 10/03/2010  . Hypertension 03/20/2010  . Vitamin D deficiency 08/20/2009  . Anxiety 05/18/2009  . Migraine headache 05/13/2006   Guadelupe Sabin, OTR/L  [phone] 10/12/2019, 3:28 PM  [facility] [address] [place], [place], [address] Phone: [phone]   Fax:  [phone]  Name: [name] MRN: [id number] Date of Birth: [birth year]

## 2019-10-12 NOTE — Therapy (Signed)
[facility] [address] [place], [place], [address] Phone: [phone]   Fax:  [phone]  Physical Therapy Treatment  Patient Details  Name: [name] MRN: [id number] Date of Birth: Dec 25, 1975 Referring Provider (PT): McLean-Scocuzza, Olivia Mackie; Melvenia Beam, MD   Encounter Date: 10/12/2019  PT End of Session - 10/12/19 1059    Visit Number  9    Number of Visits  12    Date for PT Re-Evaluation  10/20/19    Authorization Type  Primary: Medicare Secondary: BCBS Tertiary: Medicaid of [place]    Authorization Time Period  09/08/19- 10/20/19    Authorization - Visit Number  9    Authorization - Number of Visits  10    PT Start Time  [id number]   patient arrived late   PT Stop Time  1115    PT Time Calculation (min)  35 min    Activity Tolerance  Patient limited by pain    Behavior During Therapy  [facility] for tasks assessed/performed       Past Medical History:  Diagnosis Date  . Abdominal pain, epigastric 12/17/2015  . Anxiety    pt denies having dx of anxiety  . BCC (basal cell carcinoma of skin)    right foot   . Bunion, right foot   . Chronic cough   . Closed fibular fracture 10/2008   Left - Sustained 2/2 fall down stairs (same fall as tibular fracture). S/P internal fixation.  . Duodenitis    EGD (07/14/2011) showing chronic duodenitis, H.pyloir neg  . Elevated liver enzymes   . Endometriosis    S/P TAH and salpingo-oophorectomy, right  . Epigastric pain    Chronic, thought 2/2 gastritis, EGD (07/14/2011) showing chronic duodenitis, H.pyloir neg // Repeat EGD (09/2011) - esophagealring, sessible polyp in stomach body, hiatal hernia - rec ad probiotic, await bx  . Esophageal ring    s/p dilatation during EGD (07/2011) - Dr. Oneida Alar  . Fatty liver   . Gastritis    Thought due to chronic NSAID use 2/2 pain from her congenital hip abnormality  . Headache(784.0)    Migraines  . Hidradenitis suppurativa 05/2006  . 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 With chronic OA of hip  . History of migraine headaches    Typical symptoms - bright lights, unilateral (starting behind her left ear), throbbing .  Marland Kitchen Hot flashes   . Hypertension   . Internal hemorrhoids   . Osteoarthritis of hip   . Ovarian cancer ([place]) 1995  . Protrusio acetabuli    diagnosed at age 4  . Sessile colonic polyp    noted 09/2011 -- > rec colonoscopy in 10 years, 2023  . Surgical menopause 08/2007   On HRT. Occuring since 01/09 following TAH and R-salpingo-oophorectomy   . Tibial fracture 10/2008   Left - Sustained 2/2 fall down stairs. Patient is S/P closed internal fixation with Smith Nephew tibial nail locked proximal and distal  . Vitamin D deficiency 11/2009   Vit D level 15 in 11/2009    Past Surgical History:  Procedure Laterality Date  . ABDOMINAL HYSTERECTOMY    . CESAREAN SECTION  08/2005   Primary low transverse  . COLONOSCOPY  09/22/2011   [id number] polyp in the rectum/Internal hemorrhoids, benign path  . ESOPHAGOGASTRODUODENOSCOPY  09/22/2011   [id number], esophageal/Sessile polyp in the body of the stomach/Hiatal hernia/ABDOMINAL PAIN LIKELY FUNCTIONAL V. POST-INFECTIOUS IBS,  path: mild chronic gastritis  . FRACTURE SURGERY     Right hip  . JOINT REPLACEMENT     right hip  . LAPAROSCOPY  10/2000   Operative laparoscopy with lysis of right adnexal adhesions and uterointestinal adhesions  . left tib fib    . OTHER SURGICAL HISTORY  02/2009   Closed treatment internal fixation, left tibia with Tamala Julian and Nephew tibial nail locked proximal and distal  . TOTAL ABDOMINAL HYSTERECTOMY W/ BILATERAL SALPINGOOPHORECTOMY  08/2007   for endometriosis. With concurrent right salpingo-oophorectomy  (2009), left oophorectomy (1995)  . TUBAL LIGATION  08/2005   Right tubal ligation    There were no vitals filed for this visit.  Subjective Assessment - 10/12/19 1056     Subjective  Patient says her hip was sore after prone exercise yesterday. Patient says her hip/ back is about a 7 today and her neck is about a 6. Patient says her headaches have been decreasing, and yesterday was the first day in a long time she has not had one.    Currently in Pain?  Yes    Pain Score  7     Pain Location  Hip    Pain Orientation  Left;Posterior    Pain Descriptors / Indicators  Aching    Pain Type  Acute pain    Pain Onset  Yesterday    Pain Frequency  Constant    Pain Score  6    Pain Location  Neck    Pain Orientation  Left    Pain Descriptors / Indicators  Tightness    Pain Type  Chronic pain    Pain Onset  More than a month ago    Pain Frequency  Constant                       [facility] Adult PT Treatment/Exercise - 10/12/19 0001      Lumbar Exercises: Supine   Ab Set  10 reps;5 seconds    Clam  10 reps    Clam Limitations  pain free ROM     Bent Knee Raise  10 reps    Bridge  10 reps      Lumbar Exercises: Sidelying   Clam  Both;5 reps    Clam Limitations  pain free ROM                PT Short Term Goals - 09/08/19 1323      PT SHORT TERM GOAL #1   Title  Patient will be independant with initial HEP to improve functional outcomes    Time  3    Period  Weeks    Status  Achieved    Target Date  09/29/19      PT SHORT TERM GOAL #2   Title  Patient will report less than 5/10 pain globally, on average, at rest, to improve daily function    Time  3    Period  Weeks    Status  On-going    Target Date  09/29/19        PT Long Term Goals - 09/08/19 1324      PT LONG TERM GOAL #1   Title  Patient will report at least 50% improvement in cervical symptoms for improved quality of life.    Time  6    Period  Weeks    Status  New    Target Date  10/20/19      PT LONG  TERM GOAL #2   Title  Patient will improve cervical spine ROM in all planes by 50% in order to be able to drive safely.    Time  6    Period  Weeks    Status   New    Target Date  10/20/19      PT LONG TERM GOAL #3   Title  Patient will experience neck/headache pain less than 3/10 in order to improve sleep quality.    Time  6    Period  Weeks    Status  New    Target Date  10/20/19            Plan - 10/12/19 1138    Clinical Impression Statement  Patient with improved tolerance to treatment today. Patient able to progress core strengthening activity with added clamshells and abdominal marches. Patient cued on performing exercises through pain free ROM, and required verbal cues for maintained core activation. Patient noted cramping in lumbar initially, but subsided with cues for graded contraction of deep core muscles.  Patient was able to perform sidelying clamshells today for hip strengthening to improve ability to perform bed mobility and sit to supine transfers. Patient continues to demo slow, labored movement with gait and transfers. Patient performed all exercise in a slow controlled manner. Patient reported no increased pain post treatment.    Personal Factors and Comorbidities  Comorbidity 2;Past/Current Experience;Time since onset of injury/illness/exacerbation    Comorbidities  Globalized pain in multiple sites    Examination-Activity Limitations  Bed Mobility;Sleep;Bend;Squat;Bathing;Sit;Carry;Stand;Dressing;Transfers;Lift;Locomotion Level;Stairs;Reach Overhead    Examination-Participation Restrictions  Cleaning;Community Activity;Dorita Sciara    Stability/Clinical Decision Making  Evolving/Moderate complexity    Rehab Potential  Fair    PT Frequency  2x / week    PT Duration  6 weeks    PT Treatment/Interventions  ADLs/Self Care Home Management;DME Instruction;Biofeedback;Gait training;Stair training;Cryotherapy;Electrical Stimulation;Functional mobility training;Therapeutic activities;Moist Heat;Therapeutic exercise;Balance training;Manual techniques;Taping;Wheelchair mobility training;Orthotic Fit/Training;Patient/family  education;Energy conservation;Joint Manipulations;Spinal Manipulations;Passive range of motion;Neuromuscular re-education;Aquatic Therapy;Traction;Ultrasound;Splinting;Vestibular    PT Next Visit Plan  Reassess next visit. Adjust POC as indicated    PT Home Exercise Plan  2/3 deep breathing and body scan  10/03/19: LAQ    Consulted and Agree with Plan of Care  Patient       Patient will benefit from skilled therapeutic intervention in order to improve the following deficits and impairments:  Abnormal gait, Decreased endurance, Decreased strength, Decreased activity tolerance, Decreased balance, Decreased mobility, Difficulty walking, Pain, Decreased range of motion, Impaired flexibility, Improper body mechanics, Postural dysfunction, Dizziness, Increased fascial restricitons, Increased muscle spasms, Impaired perceived functional ability  Visit Diagnosis: Muscle weakness (generalized)  Acute left-sided low back pain without sciatica  Other abnormalities of gait and mobility  Pain in right hip     Problem List Patient Active Problem List   Diagnosis Date Noted  . Concussion with no loss of consciousness 08/12/2018  . Acute pain of left shoulder 08/12/2018  . Low back pain 08/12/2018  . Allergic rhinitis 08/05/2018  . Arthritis 07/01/2018  . DDD (degenerative disc disease), lumbar 05/21/2018  . Thoracic arthritis 05/21/2018  . DDD (degenerative disc disease), cervical 01/06/2018  . Hidradenitis suppurativa 10/11/2017  . Chronic frontal sinusitis 10/11/2017  . Fatty liver 10/11/2017  . Chronic ethmoidal sinusitis 09/13/2017  . Photophobia of both eyes 09/13/2017  . Abnormal liver enzymes 09/13/2017  . History of hysterectomy with bilateral oophorectomy 06/24/2017  . Estrogen deficiency 06/24/2017  . Overweight 11/17/2016  . Abdominal pain 11/04/2016  .  Headache 12/17/2015  . Chronic constipation 10/12/2012  . Hot flashes 02/20/2012  . Hiatal hernia 02/20/2012  . Chronic pain  of both hips 02/20/2012  . Fatigue 12/19/2011  . Acetabulum intrapelvic protrusion into pelvic region or thigh 05/15/2011  . Annual physical exam 10/03/2010  . Hypertension 03/20/2010  . Vitamin D deficiency 08/20/2009  . Anxiety 05/18/2009  . Migraine headache 05/13/2006   11:46 AM, 10/12/19 Josue Hector PT DPT  Physical Therapist with [facility]  [phone] [place] [address] [place], [place], [address] Phone: [phone]   Fax:  [phone]  Name: [name] MRN: [id number] Date of Birth: 1975-10-21

## 2019-10-17 ENCOUNTER — Encounter (HOSPITAL_COMMUNITY): Payer: Self-pay | Admitting: Physical Therapy

## 2019-10-17 ENCOUNTER — Other Ambulatory Visit: Payer: Self-pay

## 2019-10-17 ENCOUNTER — Ambulatory Visit (HOSPITAL_COMMUNITY): Payer: BC Managed Care – PPO | Admitting: Physical Therapy

## 2019-10-17 DIAGNOSIS — M25551 Pain in right hip: Secondary | ICD-10-CM

## 2019-10-17 DIAGNOSIS — M545 Low back pain, unspecified: Secondary | ICD-10-CM

## 2019-10-17 DIAGNOSIS — M6281 Muscle weakness (generalized): Secondary | ICD-10-CM | POA: Diagnosis not present

## 2019-10-17 DIAGNOSIS — R41840 Attention and concentration deficit: Secondary | ICD-10-CM | POA: Diagnosis not present

## 2019-10-17 DIAGNOSIS — M542 Cervicalgia: Secondary | ICD-10-CM

## 2019-10-17 DIAGNOSIS — R2689 Other abnormalities of gait and mobility: Secondary | ICD-10-CM

## 2019-10-17 DIAGNOSIS — R41844 Frontal lobe and executive function deficit: Secondary | ICD-10-CM | POA: Diagnosis not present

## 2019-10-17 NOTE — Therapy (Signed)
[facility] [address] [place], [place], [address] Phone: [phone]   Fax:  [phone]  Physical Therapy Treatment  Patient Details  Name: [name] MRN: [id number] Date of Birth: [birth year] Referring Provider (PT): McLean-Scocuzza, Olivia Mackie; Melvenia Beam, MD   Encounter Date: 10/17/2019   Progress Note Reporting Period 09/08/19 to 10/17/19  See note below for Objective Data and Assessment of Progress/Goals.       PT End of Session - 10/17/19 1042    Visit Number  10    Number of Visits  19    Date for PT Re-Evaluation  11/18/19   PN completed 10/17/19   Authorization Type  Primary: Medicare Secondary: BCBS Tertiary: Medicaid of [place]    Authorization Time Period  09/08/19- 10/20/19; 10/17/19-11/18/19    Authorization - Visit Number  10    Authorization - Number of Visits  10    PT Start Time  1030    PT Stop Time  1115    PT Time Calculation (min)  45 min    Activity Tolerance  Patient limited by pain    Behavior During Therapy  [facility] for tasks assessed/performed       Past Medical History:  Diagnosis Date  . Abdominal pain, epigastric 12/17/2015  . Anxiety    pt denies having dx of anxiety  . BCC (basal cell carcinoma of skin)    right foot   . Bunion, right foot   . Chronic cough   . Closed fibular fracture 10/2008   Left - Sustained 2/2 fall down stairs (same fall as tibular fracture). S/P internal fixation.  . Duodenitis    EGD (07/14/2011) showing chronic duodenitis, H.pyloir neg  . Elevated liver enzymes   . Endometriosis    S/P TAH and salpingo-oophorectomy, right  . Epigastric pain    Chronic, thought 2/2 gastritis, EGD (07/14/2011) showing chronic duodenitis, H.pyloir neg // Repeat EGD (09/2011) - esophagealring, sessible polyp in stomach body, hiatal hernia - rec ad probiotic, await bx  . Esophageal ring    s/p dilatation during EGD (07/2011) - Dr. Oneida Alar  . Fatty liver   . Gastritis    Thought due to  chronic NSAID use 2/2 pain from her congenital hip abnormality  . Headache(784.0)    Migraines  . Hidradenitis suppurativa 05/2006  . 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 With chronic OA of hip  . History of migraine headaches    Typical symptoms - bright lights, unilateral (starting behind her left ear), throbbing .  Marland Kitchen Hot flashes   . Hypertension   . Internal hemorrhoids   . Osteoarthritis of hip   . Ovarian cancer ([place]) 1995  . Protrusio acetabuli    diagnosed at age 61  . Sessile colonic polyp    noted 09/2011 -- > rec colonoscopy in 10 years, 2023  . Surgical menopause 08/2007   On HRT. Occuring since 01/09 following TAH and R-salpingo-oophorectomy   . Tibial fracture 10/2008   Left - Sustained 2/2 fall down stairs. Patient is S/P closed internal fixation with Smith Nephew tibial nail locked proximal and distal  . Vitamin D deficiency 11/2009   Vit D level 15 in 11/2009    Past Surgical History:  Procedure Laterality Date  . ABDOMINAL HYSTERECTOMY    . CESAREAN SECTION  08/2005   Primary low transverse  . COLONOSCOPY  09/22/2011   [id number] polyp in the rectum/Internal  hemorrhoids, benign path  . ESOPHAGOGASTRODUODENOSCOPY  09/22/2011   [id number], esophageal/Sessile polyp in the body of the stomach/Hiatal hernia/ABDOMINAL PAIN LIKELY FUNCTIONAL V. POST-INFECTIOUS IBS, path: mild chronic gastritis  . FRACTURE SURGERY     Right hip  . JOINT REPLACEMENT     right hip  . LAPAROSCOPY  10/2000   Operative laparoscopy with lysis of right adnexal adhesions and uterointestinal adhesions  . left tib fib    . OTHER SURGICAL HISTORY  02/2009   Closed treatment internal fixation, left tibia with Tamala Julian and Nephew tibial nail locked proximal and distal  . TOTAL ABDOMINAL HYSTERECTOMY W/ BILATERAL SALPINGOOPHORECTOMY  08/2007   for endometriosis. With concurrent right salpingo-oophorectomy  (2009), left oophorectomy  (1995)  . TUBAL LIGATION  08/2005   Right tubal ligation    There were no vitals filed for this visit.  Subjective Assessment - 10/17/19 1037    Subjective  Patient says her neck is bothering her today, says she thinks she may have slept wrong and cannot turn her head to the RT today. Patient says her back is doing better, and was pretty good over the weekend. Patient reports "almost 50% improvement" since starting therapy. Patient says she feels a little more balanced, but still requires use of her walker so she doesn't fall.    Pertinent History  Bilateral THA    Limitations  Sitting;Lifting;Standing;Walking;House hold activities    Diagnostic tests  CT scan, xrays    Patient Stated Goals  main goal is to get better and get back to being active.    Currently in Pain?  Yes    Pain Score  7     Pain Location  Neck    Pain Orientation  Right    Pain Descriptors / Indicators  Aching;Sharp    Pain Type  Acute pain    Pain Onset  More than a month ago    Pain Frequency  Constant    Aggravating Factors   movement    Pain Relieving Factors  rest    Effect of Pain on Daily Activities  Limits    Pain Score  5    Pain Location  Back    Pain Orientation  Left;Posterior    Pain Descriptors / Indicators  Sharp;Shooting    Pain Type  Acute pain    Pain Onset  More than a month ago    Pain Frequency  Constant    Aggravating Factors   standing, walking, prolonged sitting    Pain Relieving Factors  rest    Effect of Pain on Daily Activities  Limits         [facility] PT Assessment - 10/17/19 0001      Assessment   Medical Diagnosis  Post Concussion Syndrome    Referring Provider (PT)  McLean-Scocuzza, Olivia Mackie; Melvenia Beam, MD    Onset Date/Surgical Date  05/25/19    Next MD Visit  11/15/19    Prior Therapy  yes      Precautions   Precautions  Fall      Restrictions   Weight Bearing Restrictions  No      Balance Screen   Has the patient fallen in the past 6 months  No      [place] residence      Prior Function   Level of Independence  Independent with basic ADLs    Vocation  On disability      Cognition  Overall Cognitive Status  Within Functional Limits for tasks assessed      Observation/Other Assessments   Focus on Therapeutic Outcomes (FOTO)   60% limited    was 70%     Posture/Postural Control   Posture/Postural Control  Postural limitations    Postural Limitations  Rounded Shoulders;Forward head    Posture Comments  guarded posture and movement      AROM   Cervical Flexion  16 dg    Cervical Extension  6 dg    Cervical - Right Side Bend  12 dg    Cervical - Left Side Bend  20 dg    Cervical - Right Rotation  38dg    Cervical - Left Rotation  32 dg      Strength   Overall Strength Comments  Pain with all shoulder and elbow testing/movements in neck and back    Right Shoulder Flexion  3-/5    Right Shoulder ABduction  3-/5    Right Shoulder External Rotation  4-/5    Left Shoulder Flexion  3-/5    Left Shoulder ABduction  3-/5    Left Shoulder External Rotation  4-/5    Right Elbow Flexion  4/5    Right Elbow Extension  4/5    Left Elbow Flexion  4/5    Left Elbow Extension  4/5      Transfers   Transfers  Sit to Stand    Sit to Stand  6: Modified independent (Device/Increase time)    Comments  slow, labored movement, requires heavy use of UEs due to apparent LE weakness with transferring       Ambulation/Gait   Ambulation/Gait  Yes    Ambulation/Gait Assistance  6: Modified independent (Device/Increase time)    Ambulation Distance (Feet)  140 Feet    Assistive device  Rolling walker    Gait Pattern  Step-to pattern;Decreased hip/knee flexion - left;Antalgic;Decreased step length - left;Decreased stance time - right;Trunk flexed    Ambulation Surface  Level;Indoor    Gait Comments  2MWT                           PT Education - 10/17/19 1041    Education Details  on  reassessment findings and POC    Person(s) Educated  Patient    Methods  Explanation    Comprehension  Verbalized understanding       PT Short Term Goals - 10/17/19 1109      PT SHORT TERM GOAL #1   Title  Patient will be independant with initial HEP to improve functional outcomes    Time  3    Period  Weeks    Status  Achieved    Target Date  09/29/19      PT SHORT TERM GOAL #2   Title  Patient will report less than 5/10 pain globally, on average, at rest, to improve daily function    Baseline  Current: 6/10 average pain    Time  3    Period  Weeks    Status  On-going    Target Date  09/29/19        PT Long Term Goals - 10/17/19 1110      PT LONG TERM GOAL #1   Title  Patient will report at least 50% improvement in cervical symptoms for improved quality of life.    Baseline  Current: "almost" 50% improvement    Time  6  Period  Weeks    Status  On-going      PT LONG TERM GOAL #2   Title  Patient will improve cervical spine ROM in all planes by 50% in order to be able to drive safely.    Baseline  See ROM    Time  6    Period  Weeks    Status  On-going      PT LONG TERM GOAL #3   Title  Patient will experience neck/headache pain less than 3/10 in order to improve sleep quality.    Baseline  Current: 7/10 pain but down to 1 x headache per week    Time  6    Period  Weeks    Status  On-going            Plan - 10/17/19 1112    Clinical Impression Statement  Patient making slow progress toward therapy goals. Patient currently with  short term and 0/3 long term goals met. Patient shows some improvement in subjective report, FOTO score and reported pain levels. Patient also notes improvement in reduction of headaches related to neck pain. Patient continues to be significantly limited in ability to perform ADLs by weakness, pain with activity and ROM restrictions. Patient will continue to benefit from skilled therapy services to address these deficits to reduce  pain and improve LOF with ADLs and QOL.    Personal Factors and Comorbidities  Comorbidity 2;Past/Current Experience;Time since onset of injury/illness/exacerbation    Comorbidities  Globalized pain in multiple sites    Examination-Activity Limitations  Bed Mobility;Sleep;Bend;Squat;Bathing;Sit;Carry;Stand;Dressing;Transfers;Lift;Locomotion Level;Stairs;Reach Overhead    Examination-Participation Restrictions  Cleaning;Community Activity;[employer]    Stability/Clinical Decision Making  Evolving/Moderate complexity    Rehab Potential  Fair    PT Frequency  2x / week    PT Duration  4 weeks    PT Treatment/Interventions  ADLs/Self Care Home Management;DME Instruction;Biofeedback;Gait training;Stair training;Cryotherapy;Electrical Stimulation;Functional mobility training;Therapeutic activities;Moist Heat;Therapeutic exercise;Balance training;Manual techniques;Taping;Wheelchair mobility training;Orthotic Fit/Training;Patient/family education;Energy conservation;Joint Manipulations;Spinal Manipulations;Passive range of motion;Neuromuscular re-education;Aquatic Therapy;Traction;Ultrasound;Splinting;Vestibular    PT Next Visit Plan  Continue to progress cervical and spine mobility as well as postural strengthening as tolerated by pain.    PT Home Exercise Plan  2/3 deep breathing and body scan  10/03/19: LAQ    Consulted and Agree with Plan of Care  Patient       Patient will benefit from skilled therapeutic intervention in order to improve the following deficits and impairments:  Abnormal gait, Decreased endurance, Decreased strength, Decreased activity tolerance, Decreased balance, Decreased mobility, Difficulty walking, Pain, Decreased range of motion, Impaired flexibility, Improper body mechanics, Postural dysfunction, Dizziness, Increased fascial restricitons, Increased muscle spasms, Impaired perceived functional ability  Visit Diagnosis: Muscle weakness (generalized)  Acute left-sided low  back pain without sciatica  Other abnormalities of gait and mobility  Pain in right hip  Cervicalgia     Problem List Patient Active Problem List   Diagnosis Date Noted  . Concussion with no loss of consciousness 08/12/2018  . Acute pain of left shoulder 08/12/2018  . Low back pain 08/12/2018  . Allergic rhinitis 08/05/2018  . Arthritis 07/01/2018  . DDD (degenerative disc disease), lumbar 05/21/2018  . Thoracic arthritis 05/21/2018  . DDD (degenerative disc disease), cervical 01/06/2018  . Hidradenitis suppurativa 10/11/2017  . Chronic frontal sinusitis 10/11/2017  . Fatty liver 10/11/2017  . Chronic ethmoidal sinusitis 09/13/2017  . Photophobia of both eyes 09/13/2017  . Abnormal liver enzymes 09/13/2017  . History of hysterectomy with  bilateral oophorectomy 06/24/2017  . Estrogen deficiency 06/24/2017  . Overweight 11/17/2016  . Abdominal pain 11/04/2016  . Headache 12/17/2015  . Chronic constipation 10/12/2012  . Hot flashes 02/20/2012  . Hiatal hernia 02/20/2012  . Chronic pain of both hips 02/20/2012  . Fatigue 12/19/2011  . Acetabulum intrapelvic protrusion into pelvic region or thigh 05/15/2011  . Annual physical exam 10/03/2010  . Hypertension 03/20/2010  . Vitamin D deficiency 08/20/2009  . Anxiety 05/18/2009  . Migraine headache 05/13/2006   1:58 PM, 10/17/19 Josue Hector PT DPT  Physical Therapist with [facility]  [phone]   [facility] [address] [place], [place], [address] Phone: [phone]   Fax:  [phone]  Name: [name] MRN: [id number] Date of Birth: August 29, 1975

## 2019-10-19 ENCOUNTER — Ambulatory Visit (HOSPITAL_COMMUNITY): Payer: BC Managed Care – PPO | Admitting: Occupational Therapy

## 2019-10-19 ENCOUNTER — Telehealth (HOSPITAL_COMMUNITY): Payer: Self-pay | Admitting: Physical Therapy

## 2019-10-19 ENCOUNTER — Ambulatory Visit (HOSPITAL_COMMUNITY): Payer: BC Managed Care – PPO | Admitting: Physical Therapy

## 2019-10-19 ENCOUNTER — Telehealth (HOSPITAL_COMMUNITY): Payer: Self-pay | Admitting: Occupational Therapy

## 2019-10-19 NOTE — Telephone Encounter (Signed)
pt called to cancel both appts due to her dtr is sick

## 2019-10-21 DIAGNOSIS — F331 Major depressive disorder, recurrent, moderate: Secondary | ICD-10-CM | POA: Diagnosis not present

## 2019-10-26 ENCOUNTER — Encounter (HOSPITAL_COMMUNITY): Payer: Self-pay | Admitting: Occupational Therapy

## 2019-10-26 ENCOUNTER — Ambulatory Visit (HOSPITAL_COMMUNITY): Payer: BC Managed Care – PPO | Admitting: Occupational Therapy

## 2019-10-26 ENCOUNTER — Encounter (HOSPITAL_COMMUNITY): Payer: Self-pay | Admitting: Physical Therapy

## 2019-10-26 ENCOUNTER — Other Ambulatory Visit: Payer: Self-pay

## 2019-10-26 ENCOUNTER — Ambulatory Visit (HOSPITAL_COMMUNITY): Payer: BC Managed Care – PPO | Admitting: Physical Therapy

## 2019-10-26 DIAGNOSIS — M545 Low back pain, unspecified: Secondary | ICD-10-CM

## 2019-10-26 DIAGNOSIS — R41840 Attention and concentration deficit: Secondary | ICD-10-CM | POA: Diagnosis not present

## 2019-10-26 DIAGNOSIS — R2689 Other abnormalities of gait and mobility: Secondary | ICD-10-CM | POA: Diagnosis not present

## 2019-10-26 DIAGNOSIS — M542 Cervicalgia: Secondary | ICD-10-CM | POA: Diagnosis not present

## 2019-10-26 DIAGNOSIS — M25551 Pain in right hip: Secondary | ICD-10-CM | POA: Diagnosis not present

## 2019-10-26 DIAGNOSIS — R41844 Frontal lobe and executive function deficit: Secondary | ICD-10-CM

## 2019-10-26 DIAGNOSIS — M6281 Muscle weakness (generalized): Secondary | ICD-10-CM

## 2019-10-26 NOTE — Therapy (Signed)
[facility] [place], [place], [address] Phone: [phone]   Fax:  [phone]  Occupational Therapy Reassessment, Treatment, Discharge Summary  Patient Details  Name: [name] MRN: [id number] Date of Birth: November 18, 1975 No data recorded   Progress Note Reporting Period 09/09/2019 to 10/26/2019  See note below for Objective Data and Assessment of Progress/Goals.       Encounter Date: 10/26/2019  OT End of Session - 10/26/19 1328    Visit Number  7    Number of Visits  7    Date for OT Re-Evaluation  10/21/19    Authorization Type  1) BCBS 2) Medicare A & B 3) Medicaid    Authorization Time Period  progress note before 10th visit    Authorization - Visit Number  6    Authorization - Number of Visits  10    Progress Note Due on Visit  10    OT Start Time  1114    OT Stop Time  1157    OT Time Calculation (min)  43 min    Activity Tolerance  Patient tolerated treatment well    Behavior During Therapy  WFL for tasks assessed/performed       Past Medical History:  Diagnosis Date  . Abdominal pain, epigastric 12/17/2015  . Anxiety    pt denies having dx of anxiety  . BCC (basal cell carcinoma of skin)    right foot   . Bunion, right foot   . Chronic cough   . Closed fibular fracture 10/2008   Left - Sustained 2/2 fall down stairs (same fall as tibular fracture). S/P internal fixation.  . Duodenitis    EGD (07/14/2011) showing chronic duodenitis, H.pyloir neg  . Elevated liver enzymes   . Endometriosis    S/P TAH and salpingo-oophorectomy, right  . Epigastric pain    Chronic, thought 2/2 gastritis, EGD (07/14/2011) showing chronic duodenitis, H.pyloir neg // Repeat EGD (09/2011) - esophagealring, sessible polyp in stomach body, hiatal hernia - rec ad probiotic, await bx  . Esophageal ring    s/p dilatation during EGD (07/2011) - Dr. Oneida Alar  . Fatty liver   . Gastritis    Thought due to chronic NSAID use  2/2 pain from her congenital hip abnormality  . Headache(784.0)    Migraines  . Hidradenitis suppurativa 05/2006  . 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 With chronic OA of hip  . History of migraine headaches    Typical symptoms - bright lights, unilateral (starting behind her left ear), throbbing .  Marland Kitchen Hot flashes   . Hypertension   . Internal hemorrhoids   . Osteoarthritis of hip   . Ovarian cancer ([place]) 1995  . Protrusio acetabuli    diagnosed at age 80  . Sessile colonic polyp    noted 09/2011 -- > rec colonoscopy in 10 years, 2023  . Surgical menopause 08/2007   On HRT. Occuring since 01/09 following TAH and R-salpingo-oophorectomy   . Tibial fracture 10/2008   Left - Sustained 2/2 fall down stairs. Patient is S/P closed internal fixation with Smith Nephew tibial nail locked proximal and distal  . Vitamin D deficiency 11/2009   Vit D level 15 in 11/2009    Past Surgical History:  Procedure Laterality Date  . ABDOMINAL HYSTERECTOMY    . CESAREAN SECTION  08/2005   Primary low transverse  . COLONOSCOPY  09/22/2011  [id number] polyp in the rectum/Internal hemorrhoids, benign path  . ESOPHAGOGASTRODUODENOSCOPY  09/22/2011   [id number], esophageal/Sessile polyp in the body of the stomach/Hiatal hernia/ABDOMINAL PAIN LIKELY FUNCTIONAL V. POST-INFECTIOUS IBS, path: mild chronic gastritis  . FRACTURE SURGERY     Right hip  . JOINT REPLACEMENT     right hip  . LAPAROSCOPY  10/2000   Operative laparoscopy with lysis of right adnexal adhesions and uterointestinal adhesions  . left tib fib    . OTHER SURGICAL HISTORY  02/2009   Closed treatment internal fixation, left tibia with Tamala Julian and Nephew tibial nail locked proximal and distal  . TOTAL ABDOMINAL HYSTERECTOMY W/ BILATERAL SALPINGOOPHORECTOMY  08/2007   for endometriosis. With concurrent right salpingo-oophorectomy  (2009), left oophorectomy (1995)  . TUBAL  LIGATION  08/2005   Right tubal ligation    There were no vitals filed for this visit.  Subjective Assessment - 10/26/19 1113    Subjective   S: I feel like I'm doing so much better now.    Currently in Pain?  Yes    Pain Score  4     Pain Location  Neck    Pain Orientation  Left    Pain Descriptors / Indicators  Sharp    Pain Type  Acute pain    Pain Radiating Towards  shoulder blades    Pain Onset  More than a month ago    Pain Frequency  Constant    Aggravating Factors   movement    Pain Relieving Factors  rest    Effect of Pain on Daily Activities  mod effect on ADLs    Multiple Pain Sites  No         [facility] OT Assessment - 10/26/19 1113      Assessment   Medical Diagnosis  Post Concussion Syndrome      Precautions   Precautions  Fall      Cognition   Cognition Comments  [place] Concussion Scale: 4/27 rated as severe (neck pain), 3/27 rated as moderate, 7/27 rated as mild, and 13/27 rated as none or no symptoms.     17/27 rated as severe at evaluation.               OT Treatments/Exercises (OP) - 10/26/19 1304      ADLs   Overall ADLs  Pt reports improvement in ADL and instrumental ADL completion with use of planning and time management strategies discussed at last session. Pt reports family participation in chore lists and less stress when not trying to get everything done at one time.     ADL Comments  Discussed progress during course of therapy, pt completing Concussion Scale this session and has improved significantly in >50% of symptoms with exception of neck pain which PT is addressing. Pt reports improvement in all goal areas and reports improved sleep has helped with her physical symptoms such as headaches, concentration, and mental acuity.                OT Short Term Goals - 10/26/19 1141      OT SHORT TERM GOAL #1   Title  Pt will report improved sleep by successfully utilizing sleep hygiene education and routine developed during  therapy.    Time  6    Period  Weeks    Status  Achieved    Target Date  10/21/19      OT SHORT TERM GOAL #2   Title  Pt will be report decreased  stress and anxiety levels by verbalizing at least 3 sucessful stress management techniques utilized during times of increased stress for calm and focus.    Time  6    Period  Weeks    Status  Achieved      OT SHORT TERM GOAL #3   Title  Pt will improve ADL and I/ADL performance by reporting use of at least 2 cognitive strategies for planning and time management.    Time  6    Period  Weeks    Status  Achieved      OT SHORT TERM GOAL #4   Title  Pt will be educated on and verbalize use of at least 2 energy conservation techniques to increase energy and decrease fatigue during the day.    Time  6    Period  Weeks    Status  Achieved      OT SHORT TERM GOAL #5   Title  Pt will report improvement in ability to engage in family movie and game nights by utilizing utilizing sensory processing strategies to decrease "brain jitters" and anxiety/overwhelmed feeling.    Time  6    Period  Weeks    Status  Achieved      OT SHORT TERM GOAL #6   Title  Pt will improve ability to fix a simple meal or bake preferred items by utilizing compensatory strategies for short term memory deficits.    Time  6    Period  Weeks    Status  Achieved      OT SHORT TERM GOAL #7   Title  Pt will be educated on and utilize compensatory strategies for visual impairments (including visual-perceptual issues) to improve ability to read.    Time  6    Period  Weeks    Status  Achieved               Plan - 10/26/19 1329    Clinical Impression Statement  A: Reassessment completed this session, pt has met all goals reporting improvements in areas of sleep hygiene, planning and time management, energy conservation, cognitive strategies including short term memory (uses timers often), and stress management techniques. Pt reports signficant improvement in sleep  hygiene which has in turn improved cognition and concentrations during ADL tasks. At this time pt is primarily concerned with her physical limitations which are being addressed in PT. Educated on energy conservation this session and provided additional calendar handouts for planning purposes. Pt is agreeable to discharge this session.    Cognitive Skills  Attention;Thought;Emotional;Energy/Drive;Memory;Problem Solve;Safety Awareness;Sequencing    Psychosocial Skills  Coping Strategies;Environmental  Adaptations;Habits;Interpersonal Interaction;Routines and Behaviors    Plan  P: Discharge pt       Patient will benefit from skilled therapeutic intervention in order to improve the following deficits and impairments:     Cognitive Skills: Attention, Thought, Emotional, Energy/Drive, Memory, Problem Solve, Safety Awareness, Sequencing Psychosocial Skills: Coping Strategies, Environmental  Adaptations, Habits, Interpersonal Interaction, Routines and Behaviors   Visit Diagnosis: Frontal lobe and executive function deficit  Attention and concentration deficit    Problem List Patient Active Problem List   Diagnosis Date Noted  . Concussion with no loss of consciousness 08/12/2018  . Acute pain of left shoulder 08/12/2018  . Low back pain 08/12/2018  . Allergic rhinitis 08/05/2018  . Arthritis 07/01/2018  . DDD (degenerative disc disease), lumbar 05/21/2018  . Thoracic arthritis 05/21/2018  . DDD (degenerative disc disease), cervical 01/06/2018  . Hidradenitis suppurativa  10/11/2017  . Chronic frontal sinusitis 10/11/2017  . Fatty liver 10/11/2017  . Chronic ethmoidal sinusitis 09/13/2017  . Photophobia of both eyes 09/13/2017  . Abnormal liver enzymes 09/13/2017  . History of hysterectomy with bilateral oophorectomy 06/24/2017  . Estrogen deficiency 06/24/2017  . Overweight 11/17/2016  . Abdominal pain 11/04/2016  . Headache 12/17/2015  . Chronic constipation 10/12/2012  . Hot  flashes 02/20/2012  . Hiatal hernia 02/20/2012  . Chronic pain of both hips 02/20/2012  . Fatigue 12/19/2011  . Acetabulum intrapelvic protrusion into pelvic region or thigh 05/15/2011  . Annual physical exam 10/03/2010  . Hypertension 03/20/2010  . Vitamin D deficiency 08/20/2009  . Anxiety 05/18/2009  . Migraine headache 05/13/2006   Guadelupe Sabin, OTR/L  [phone] 10/26/2019, 1:32 PM  [facility] [address] [place], [place], [address] Phone: [phone]   Fax:  [phone]  Name: [name] MRN: [id number] Date of Birth: [birth year]   OCCUPATIONAL THERAPY DISCHARGE SUMMARY  Visits from Start of Care: 7  Current functional level related to goals / functional outcomes: See above.    Remaining deficits: Physical pain and mobility limitations effecting ability to perform ADLs at her highest level of functioning. Some sensitivity to light and sound impacting concentration.    Education / Equipment: Energy conservation. Continuing to maintain sleep hygiene and stress management strategies, as well as using time management and planning techniques.  Plan: Patient agrees to discharge.  Patient goals were met. Patient is being discharged due to meeting the stated rehab goals.  ?????

## 2019-10-26 NOTE — Therapy (Signed)
[facility] [address] [place], [place], [address] Phone: [phone]   Fax:  [phone]  Physical Therapy Treatment  Patient Details  Name: [name] MRN: [id number] Date of Birth: Jan 13, 1976 Referring Provider (PT): McLean-Scocuzza, Olivia Mackie; Melvenia Beam, MD   Encounter Date: 10/26/2019  PT End of Session - 10/26/19 1036    Visit Number  11    Number of Visits  19    Date for PT Re-Evaluation  11/18/19   PN completed 10/17/19   Authorization Type  Primary: Medicare Secondary: BCBS Tertiary: Medicaid of [place]    Authorization Time Period  09/08/19- 10/20/19; 10/17/19-11/18/19    Authorization - Visit Number  1    Authorization - Number of Visits  10    PT Start Time  1034    PT Stop Time  1113    PT Time Calculation (min)  39 min    Activity Tolerance  Patient tolerated treatment well    Behavior During Therapy  WFL for tasks assessed/performed       Past Medical History:  Diagnosis Date  . Abdominal pain, epigastric 12/17/2015  . Anxiety    pt denies having dx of anxiety  . BCC (basal cell carcinoma of skin)    right foot   . Bunion, right foot   . Chronic cough   . Closed fibular fracture 10/2008   Left - Sustained 2/2 fall down stairs (same fall as tibular fracture). S/P internal fixation.  . Duodenitis    EGD (07/14/2011) showing chronic duodenitis, H.pyloir neg  . Elevated liver enzymes   . Endometriosis    S/P TAH and salpingo-oophorectomy, right  . Epigastric pain    Chronic, thought 2/2 gastritis, EGD (07/14/2011) showing chronic duodenitis, H.pyloir neg // Repeat EGD (09/2011) - esophagealring, sessible polyp in stomach body, hiatal hernia - rec ad probiotic, await bx  . Esophageal ring    s/p dilatation during EGD (07/2011) - Dr. Oneida Alar  . Fatty liver   . Gastritis    Thought due to chronic NSAID use 2/2 pain from her congenital hip abnormality  . Headache(784.0)    Migraines  . Hidradenitis suppurativa  05/2006  . 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 With chronic OA of hip  . History of migraine headaches    Typical symptoms - bright lights, unilateral (starting behind her left ear), throbbing .  Marland Kitchen Hot flashes   . Hypertension   . Internal hemorrhoids   . Osteoarthritis of hip   . Ovarian cancer ([place]) 1995  . Protrusio acetabuli    diagnosed at age 26  . Sessile colonic polyp    noted 09/2011 -- > rec colonoscopy in 10 years, 2023  . Surgical menopause 08/2007   On HRT. Occuring since 01/09 following TAH and R-salpingo-oophorectomy   . Tibial fracture 10/2008   Left - Sustained 2/2 fall down stairs. Patient is S/P closed internal fixation with Smith Nephew tibial nail locked proximal and distal  . Vitamin D deficiency 11/2009   Vit D level 15 in 11/2009    Past Surgical History:  Procedure Laterality Date  . ABDOMINAL HYSTERECTOMY    . CESAREAN SECTION  08/2005   Primary low transverse  . COLONOSCOPY  09/22/2011   [id number] polyp in the rectum/Internal hemorrhoids, benign path  . ESOPHAGOGASTRODUODENOSCOPY  09/22/2011   [id number], esophageal/Sessile polyp in the body of the stomach/Hiatal hernia/ABDOMINAL PAIN LIKELY FUNCTIONAL V. POST-INFECTIOUS  IBS, path: mild chronic gastritis  . FRACTURE SURGERY     Right hip  . JOINT REPLACEMENT     right hip  . LAPAROSCOPY  10/2000   Operative laparoscopy with lysis of right adnexal adhesions and uterointestinal adhesions  . left tib fib    . OTHER SURGICAL HISTORY  02/2009   Closed treatment internal fixation, left tibia with Tamala Julian and Nephew tibial nail locked proximal and distal  . TOTAL ABDOMINAL HYSTERECTOMY W/ BILATERAL SALPINGOOPHORECTOMY  08/2007   for endometriosis. With concurrent right salpingo-oophorectomy  (2009), left oophorectomy (1995)  . TUBAL LIGATION  08/2005   Right tubal ligation    There were no vitals filed for this visit.  Subjective  Assessment - 10/26/19 1039    Subjective  Patient says she is feeling better today. Says she tried an energetic healing course that was recommended to her by a friend. She says this was helpful and reduced neck and back pain. Patient says she tried to walk a little using her cane out home, but her cane broke.    Pertinent History  Bilateral THA    Limitations  Sitting;Lifting;Standing;Walking;House hold activities    Diagnostic tests  CT scan, xrays    Patient Stated Goals  main goal is to get better and get back to being active.    Currently in Pain?  Yes    Pain Score  5     Pain Location  Neck    Pain Orientation  Left    Pain Descriptors / Indicators  Sharp    Pain Type  Acute pain    Pain Onset  More than a month ago    Pain Frequency  Constant    Pain Score  5    Pain Location  Back    Pain Orientation  Left;Posterior    Pain Descriptors / Indicators  Sharp;Shooting    Pain Type  Acute pain    Pain Onset  More than a month ago    Pain Frequency  Constant                       [facility] Adult PT Treatment/Exercise - 10/26/19 0001      Neck Exercises: Standing   Other Standing Exercises  wall slides 5 x each       Neck Exercises: Seated   Neck Retraction  10 reps    Other Seated Exercise  seated postural correction with diaphragm breathing, x10 deep breaths     Other Seated Exercise  cervical AROM rotation, flexion, extension x10 each       Manual Therapy   Manual Therapy  Soft tissue mobilization    Manual therapy comments  Manual complete separate than rest of tx    Soft tissue mobilization  IASTM using theragun to bilateral upper trap, levator, and scapular borders for pain and improved mobility                PT Short Term Goals - 10/17/19 1109      PT SHORT TERM GOAL #1   Title  Patient will be independant with initial HEP to improve functional outcomes    Time  3    Period  Weeks    Status  Achieved    Target Date  09/29/19      PT SHORT  TERM GOAL #2   Title  Patient will report less than 5/10 pain globally, on average, at rest, to improve daily function  Baseline  Current: 6/10 average pain    Time  3    Period  Weeks    Status  On-going    Target Date  09/29/19        PT Long Term Goals - 10/17/19 1110      PT LONG TERM GOAL #1   Title  Patient will report at least 50% improvement in cervical symptoms for improved quality of life.    Baseline  Current: "almost" 50% improvement    Time  6    Period  Weeks    Status  On-going      PT LONG TERM GOAL #2   Title  Patient will improve cervical spine ROM in all planes by 50% in order to be able to drive safely.    Baseline  See ROM    Time  6    Period  Weeks    Status  On-going      PT LONG TERM GOAL #3   Title  Patient will experience neck/headache pain less than 3/10 in order to improve sleep quality.    Baseline  Current: 7/10 pain but down to 1 x headache per week    Time  6    Period  Weeks    Status  On-going            Plan - 10/26/19 1117    Clinical Impression Statement  Patient tolerated session well overall today. Continues to be limited by pain at end range cervical ROM and shoulder mobility. Overall pain level less throughout session today. Focused on cervical mobility and postural strengthening today. Patient required verbal cues for proper seated posture with postural correction activity with deep breathing. Added IASTM for pain and restriction in neck and upper back, patient noted decreased pain and improved LT cervical rotation post treatment.    Personal Factors and Comorbidities  Comorbidity 2;Past/Current Experience;Time since onset of injury/illness/exacerbation    Comorbidities  Globalized pain in multiple sites    Examination-Activity Limitations  Bed Mobility;Sleep;Bend;Squat;Bathing;Sit;Carry;Stand;Dressing;Transfers;Lift;Locomotion Level;Stairs;Reach Overhead    Examination-Participation Restrictions  Cleaning;Community  Activity;[employer]    Stability/Clinical Decision Making  Evolving/Moderate complexity    Rehab Potential  Fair    PT Frequency  2x / week    PT Duration  4 weeks    PT Treatment/Interventions  ADLs/Self Care Home Management;DME Instruction;Biofeedback;Gait training;Stair training;Cryotherapy;Electrical Stimulation;Functional mobility training;Therapeutic activities;Moist Heat;Therapeutic exercise;Balance training;Manual techniques;Taping;Wheelchair mobility training;Orthotic Fit/Training;Patient/family education;Energy conservation;Joint Manipulations;Spinal Manipulations;Passive range of motion;Neuromuscular re-education;Aquatic Therapy;Traction;Ultrasound;Splinting;Vestibular    PT Next Visit Plan  Continue to progress cervical and spine mobility as well as postural strengthening as tolerated by pain. Try cervical isometrics, shoulder slides on ball next visit    PT Home Exercise Plan  2/3 deep breathing and body scan  10/03/19: LAQ    Consulted and Agree with Plan of Care  Patient       Patient will benefit from skilled therapeutic intervention in order to improve the following deficits and impairments:  Abnormal gait, Decreased endurance, Decreased strength, Decreased activity tolerance, Decreased balance, Decreased mobility, Difficulty walking, Pain, Decreased range of motion, Impaired flexibility, Improper body mechanics, Postural dysfunction, Dizziness, Increased fascial restricitons, Increased muscle spasms, Impaired perceived functional ability  Visit Diagnosis: Muscle weakness (generalized)  Acute left-sided low back pain without sciatica  Other abnormalities of gait and mobility  Cervicalgia     Problem List Patient Active Problem List   Diagnosis Date Noted  . Concussion with no loss of consciousness 08/12/2018  . Acute pain  of left shoulder 08/12/2018  . Low back pain 08/12/2018  . Allergic rhinitis 08/05/2018  . Arthritis 07/01/2018  . DDD (degenerative disc  disease), lumbar 05/21/2018  . Thoracic arthritis 05/21/2018  . DDD (degenerative disc disease), cervical 01/06/2018  . Hidradenitis suppurativa 10/11/2017  . Chronic frontal sinusitis 10/11/2017  . Fatty liver 10/11/2017  . Chronic ethmoidal sinusitis 09/13/2017  . Photophobia of both eyes 09/13/2017  . Abnormal liver enzymes 09/13/2017  . History of hysterectomy with bilateral oophorectomy 06/24/2017  . Estrogen deficiency 06/24/2017  . Overweight 11/17/2016  . Abdominal pain 11/04/2016  . Headache 12/17/2015  . Chronic constipation 10/12/2012  . Hot flashes 02/20/2012  . Hiatal hernia 02/20/2012  . Chronic pain of both hips 02/20/2012  . Fatigue 12/19/2011  . Acetabulum intrapelvic protrusion into pelvic region or thigh 05/15/2011  . Annual physical exam 10/03/2010  . Hypertension 03/20/2010  . Vitamin D deficiency 08/20/2009  . Anxiety 05/18/2009  . Migraine headache 05/13/2006   12:14 PM, 10/26/19 Josue Hector PT DPT  Physical Therapist with [facility]  [phone]   [facility] [address] [place], [place], [address] Phone: [phone]   Fax:  [phone]  Name: [name] MRN: [id number] Date of Birth: 1976-05-17

## 2019-10-27 DIAGNOSIS — L811 Chloasma: Secondary | ICD-10-CM | POA: Diagnosis not present

## 2019-10-27 DIAGNOSIS — L732 Hidradenitis suppurativa: Secondary | ICD-10-CM | POA: Diagnosis not present

## 2019-10-27 DIAGNOSIS — L723 Sebaceous cyst: Secondary | ICD-10-CM | POA: Diagnosis not present

## 2019-11-01 ENCOUNTER — Ambulatory Visit (HOSPITAL_COMMUNITY): Payer: BC Managed Care – PPO | Admitting: Physical Therapy

## 2019-11-01 ENCOUNTER — Encounter (HOSPITAL_COMMUNITY): Payer: Self-pay | Admitting: Physical Therapy

## 2019-11-01 ENCOUNTER — Other Ambulatory Visit: Payer: Self-pay

## 2019-11-01 DIAGNOSIS — R2689 Other abnormalities of gait and mobility: Secondary | ICD-10-CM

## 2019-11-01 DIAGNOSIS — M545 Low back pain, unspecified: Secondary | ICD-10-CM

## 2019-11-01 DIAGNOSIS — M542 Cervicalgia: Secondary | ICD-10-CM | POA: Diagnosis not present

## 2019-11-01 DIAGNOSIS — M6281 Muscle weakness (generalized): Secondary | ICD-10-CM | POA: Diagnosis not present

## 2019-11-01 DIAGNOSIS — R41844 Frontal lobe and executive function deficit: Secondary | ICD-10-CM | POA: Diagnosis not present

## 2019-11-01 DIAGNOSIS — M25551 Pain in right hip: Secondary | ICD-10-CM

## 2019-11-01 DIAGNOSIS — R41840 Attention and concentration deficit: Secondary | ICD-10-CM | POA: Diagnosis not present

## 2019-11-01 NOTE — Therapy (Signed)
[facility] [address] [place], [place], [address] Phone: [phone]   Fax:  [phone]  Physical Therapy Treatment  Patient Details  Name: [name] MRN: [id number] Date of Birth: February 15, 1976 Referring Provider (PT): McLean-Scocuzza, Olivia Mackie; Melvenia Beam, MD   Encounter Date: 11/01/2019  PT End of Session - 11/01/19 0959    Visit Number  12    Number of Visits  19    Date for PT Re-Evaluation  11/18/19   PN completed 10/17/19   Authorization Type  Primary: Medicare Secondary: BCBS Tertiary: Medicaid of [place]    Authorization Time Period  09/08/19- 10/20/19; 10/17/19-11/18/19    Authorization - Visit Number  2    Authorization - Number of Visits  10    PT Start Time  0915    PT Stop Time  0955    PT Time Calculation (min)  40 min    Activity Tolerance  Patient tolerated treatment well    Behavior During Therapy  [facility] for tasks assessed/performed       Past Medical History:  Diagnosis Date  . Abdominal pain, epigastric 12/17/2015  . Anxiety    pt denies having dx of anxiety  . BCC (basal cell carcinoma of skin)    right foot   . Bunion, right foot   . Chronic cough   . Closed fibular fracture 10/2008   Left - Sustained 2/2 fall down stairs (same fall as tibular fracture). S/P internal fixation.  . Duodenitis    EGD (07/14/2011) showing chronic duodenitis, H.pyloir neg  . Elevated liver enzymes   . Endometriosis    S/P TAH and salpingo-oophorectomy, right  . Epigastric pain    Chronic, thought 2/2 gastritis, EGD (07/14/2011) showing chronic duodenitis, H.pyloir neg // Repeat EGD (09/2011) - esophagealring, sessible polyp in stomach body, hiatal hernia - rec ad probiotic, await bx  . Esophageal ring    s/p dilatation during EGD (07/2011) - Dr. Oneida Alar  . Fatty liver   . Gastritis    Thought due to chronic NSAID use 2/2 pain from her congenital hip abnormality  . Headache(784.0)    Migraines  . Hidradenitis suppurativa  05/2006  . 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 With chronic OA of hip  . History of migraine headaches    Typical symptoms - bright lights, unilateral (starting behind her left ear), throbbing .  Marland Kitchen Hot flashes   . Hypertension   . Internal hemorrhoids   . Osteoarthritis of hip   . Ovarian cancer ([place]) 1995  . Protrusio acetabuli    diagnosed at age 54  . Sessile colonic polyp    noted 09/2011 -- > rec colonoscopy in 10 years, 2023  . Surgical menopause 08/2007   On HRT. Occuring since 01/09 following TAH and R-salpingo-oophorectomy   . Tibial fracture 10/2008   Left - Sustained 2/2 fall down stairs. Patient is S/P closed internal fixation with Smith Nephew tibial nail locked proximal and distal  . Vitamin D deficiency 11/2009   Vit D level 15 in 11/2009    Past Surgical History:  Procedure Laterality Date  . ABDOMINAL HYSTERECTOMY    . CESAREAN SECTION  08/2005   Primary low transverse  . COLONOSCOPY  09/22/2011   [id number] polyp in the rectum/Internal hemorrhoids, benign path  . ESOPHAGOGASTRODUODENOSCOPY  09/22/2011   [id number], esophageal/Sessile polyp in the body of the stomach/Hiatal hernia/ABDOMINAL PAIN LIKELY FUNCTIONAL V. POST-INFECTIOUS  IBS, path: mild chronic gastritis  . FRACTURE SURGERY     Right hip  . JOINT REPLACEMENT     right hip  . LAPAROSCOPY  10/2000   Operative laparoscopy with lysis of right adnexal adhesions and uterointestinal adhesions  . left tib fib    . OTHER SURGICAL HISTORY  02/2009   Closed treatment internal fixation, left tibia with Tamala Julian and Nephew tibial nail locked proximal and distal  . TOTAL ABDOMINAL HYSTERECTOMY W/ BILATERAL SALPINGOOPHORECTOMY  08/2007   for endometriosis. With concurrent right salpingo-oophorectomy  (2009), left oophorectomy (1995)  . TUBAL LIGATION  08/2005   Right tubal ligation    There were no vitals filed for this visit.  Subjective  Assessment - 11/01/19 0923    Subjective  States she needs to get a new cane and looking into massage gun. Reports 5/10 painin neck today. Can tell a big difference. Patient reports she has a goal to go up and down the stairs by her birhtday so she can move back into her room.    Pertinent History  Bilateral THA    Limitations  Sitting;Lifting;Standing;Walking;House hold activities    Diagnostic tests  CT scan, xrays    Patient Stated Goals  main goal is to get better and get back to being active.    Pain Onset  More than a month ago    Pain Onset  More than a month ago                       [facility] Adult PT Treatment/Exercise - 11/01/19 0001      Ambulation/Gait   Ambulation/Gait  Yes    Ambulation/Gait Assistance  6: Modified independent (Device/Increase time)    Ambulation Distance (Feet)  226 Feet    Assistive device  Straight cane    Gait Pattern  Step-to pattern;Decreased hip/knee flexion - left;Antalgic;Decreased step length - left;Decreased stance time - right;Trunk flexed    Ambulation Surface  Level;Unlevel    Stairs  Yes    Stair Management Technique  Two rails;Step to pattern;Forwards    Number of Stairs  7    Height of Stairs  4      Neck Exercises: Seated   Other Seated Exercise  AAROM shoulder abd 3x5 B with cane      Manual Therapy   Manual Therapy  Soft tissue mobilization    Manual therapy comments  Manual complete separate than rest of tx    Soft tissue mobilization  IASTM using theragun to bilateral upper trap, levator, and scapular borders for pain and improved mobility - large ball head used, setting #              PT Education - 11/01/19 0944    Education Details  Educated patient on use of massage gun and where to purchase. Educated patient on use of cane versus walker and printed off a cane with small pronged base. Answered all questions about different types of canes and massage guns.    Person(s) Educated  Patient    Methods   Explanation    Comprehension  Verbalized understanding       PT Short Term Goals - 10/17/19 1109      PT SHORT TERM GOAL #1   Title  Patient will be independant with initial HEP to improve functional outcomes    Time  3    Period  Weeks    Status  Achieved    Target Date  09/29/19      PT SHORT TERM GOAL #2   Title  Patient will report less than 5/10 pain globally, on average, at rest, to improve daily function    Baseline  Current: 6/10 average pain    Time  3    Period  Weeks    Status  On-going    Target Date  09/29/19        PT Long Term Goals - 10/17/19 1110      PT LONG TERM GOAL #1   Title  Patient will report at least 50% improvement in cervical symptoms for improved quality of life.    Baseline  Current: "almost" 50% improvement    Time  6    Period  Weeks    Status  On-going      PT LONG TERM GOAL #2   Title  Patient will improve cervical spine ROM in all planes by 50% in order to be able to drive safely.    Baseline  See ROM    Time  6    Period  Weeks    Status  On-going      PT LONG TERM GOAL #3   Title  Patient will experience neck/headache pain less than 3/10 in order to improve sleep quality.    Baseline  Current: 7/10 pain but down to 1 x headache per week    Time  6    Period  Weeks    Status  On-going            Plan - 11/01/19 0959    Clinical Impression Statement  Patient tolerated session well. Able to ambulate with cane around clinic and go up and down steps with just stand by assist. Fatigue noted end of session but decreased pain with 4/10 noted end of session after use of percussion gun. Will continue to work on shoulder and neck mobility, as well as incorporate functional tasks like walking with cane and stair training as tolerated.    Personal Factors and Comorbidities  Comorbidity 2;Past/Current Experience;Time since onset of injury/illness/exacerbation    Comorbidities  Globalized pain in multiple sites    Examination-Activity  Limitations  Bed Mobility;Sleep;Bend;Squat;Bathing;Sit;Carry;Stand;Dressing;Transfers;Lift;Locomotion Level;Stairs;Reach Overhead    Examination-Participation Restrictions  Cleaning;Community Activity;[employer]    Stability/Clinical Decision Making  Evolving/Moderate complexity    Rehab Potential  Fair    PT Frequency  2x / week    PT Duration  4 weeks    PT Treatment/Interventions  ADLs/Self Care Home Management;DME Instruction;Biofeedback;Gait training;Stair training;Cryotherapy;Electrical Stimulation;Functional mobility training;Therapeutic activities;Moist Heat;Therapeutic exercise;Balance training;Manual techniques;Taping;Wheelchair mobility training;Orthotic Fit/Training;Patient/family education;Energy conservation;Joint Manipulations;Spinal Manipulations;Passive range of motion;Neuromuscular re-education;Aquatic Therapy;Traction;Ultrasound;Splinting;Vestibular    PT Next Visit Plan  continue with walking with cane and stair training. Continue to progress cervical and spine mobility as well as postural strengthening as tolerated by pain. Try cervical isometrics, shoulder slides on ball next visit    PT Home Exercise Plan  2/3 deep breathing and body scan  10/03/19: LAQ    Consulted and Agree with Plan of Care  Patient       Patient will benefit from skilled therapeutic intervention in order to improve the following deficits and impairments:  Abnormal gait, Decreased endurance, Decreased strength, Decreased activity tolerance, Decreased balance, Decreased mobility, Difficulty walking, Pain, Decreased range of motion, Impaired flexibility, Improper body mechanics, Postural dysfunction, Dizziness, Increased fascial restricitons, Increased muscle spasms, Impaired perceived functional ability  Visit Diagnosis: Muscle weakness (generalized)  Acute left-sided low back pain without sciatica  Other  abnormalities of gait and mobility  Cervicalgia  Pain in right hip     Problem  List Patient Active Problem List   Diagnosis Date Noted  . Concussion with no loss of consciousness 08/12/2018  . Acute pain of left shoulder 08/12/2018  . Low back pain 08/12/2018  . Allergic rhinitis 08/05/2018  . Arthritis 07/01/2018  . DDD (degenerative disc disease), lumbar 05/21/2018  . Thoracic arthritis 05/21/2018  . DDD (degenerative disc disease), cervical 01/06/2018  . Hidradenitis suppurativa 10/11/2017  . Chronic frontal sinusitis 10/11/2017  . Fatty liver 10/11/2017  . Chronic ethmoidal sinusitis 09/13/2017  . Photophobia of both eyes 09/13/2017  . Abnormal liver enzymes 09/13/2017  . History of hysterectomy with bilateral oophorectomy 06/24/2017  . Estrogen deficiency 06/24/2017  . Overweight 11/17/2016  . Abdominal pain 11/04/2016  . Headache 12/17/2015  . Chronic constipation 10/12/2012  . Hot flashes 02/20/2012  . Hiatal hernia 02/20/2012  . Chronic pain of both hips 02/20/2012  . Fatigue 12/19/2011  . Acetabulum intrapelvic protrusion into pelvic region or thigh 05/15/2011  . Annual physical exam 10/03/2010  . Hypertension 03/20/2010  . Vitamin D deficiency 08/20/2009  . Anxiety 05/18/2009  . Migraine headache 05/13/2006    10:00 AM, 11/01/19 Jerene Pitch, DPT Physical Therapy with [facility]  [phone] office  [facility] [address] [place], [place], [address] Phone: [phone]   Fax:  [phone]  Name: JNIAH FISHBURNE MRN: [id number] Date of Birth: 04-Nov-1975

## 2019-11-02 DIAGNOSIS — E039 Hypothyroidism, unspecified: Secondary | ICD-10-CM | POA: Diagnosis not present

## 2019-11-02 DIAGNOSIS — J3489 Other specified disorders of nose and nasal sinuses: Secondary | ICD-10-CM | POA: Diagnosis not present

## 2019-11-02 DIAGNOSIS — J343 Hypertrophy of nasal turbinates: Secondary | ICD-10-CM | POA: Diagnosis not present

## 2019-11-02 DIAGNOSIS — J342 Deviated nasal septum: Secondary | ICD-10-CM | POA: Diagnosis not present

## 2019-11-02 DIAGNOSIS — M26621 Arthralgia of right temporomandibular joint: Secondary | ICD-10-CM | POA: Diagnosis not present

## 2019-11-02 DIAGNOSIS — H9201 Otalgia, right ear: Secondary | ICD-10-CM | POA: Diagnosis not present

## 2019-11-02 DIAGNOSIS — H938X1 Other specified disorders of right ear: Secondary | ICD-10-CM | POA: Diagnosis not present

## 2019-11-03 ENCOUNTER — Other Ambulatory Visit: Payer: Self-pay

## 2019-11-03 ENCOUNTER — Ambulatory Visit (HOSPITAL_COMMUNITY): Payer: BC Managed Care – PPO | Admitting: Physical Therapy

## 2019-11-03 DIAGNOSIS — R41840 Attention and concentration deficit: Secondary | ICD-10-CM | POA: Diagnosis not present

## 2019-11-03 DIAGNOSIS — R41844 Frontal lobe and executive function deficit: Secondary | ICD-10-CM | POA: Diagnosis not present

## 2019-11-03 DIAGNOSIS — M6281 Muscle weakness (generalized): Secondary | ICD-10-CM | POA: Diagnosis not present

## 2019-11-03 DIAGNOSIS — M545 Low back pain, unspecified: Secondary | ICD-10-CM

## 2019-11-03 DIAGNOSIS — M25551 Pain in right hip: Secondary | ICD-10-CM | POA: Diagnosis not present

## 2019-11-03 DIAGNOSIS — R2689 Other abnormalities of gait and mobility: Secondary | ICD-10-CM

## 2019-11-03 DIAGNOSIS — M542 Cervicalgia: Secondary | ICD-10-CM | POA: Diagnosis not present

## 2019-11-03 NOTE — Therapy (Signed)
[facility] [address] [place], [place], [address] Phone: [phone]   Fax:  [phone]  Physical Therapy Treatment  Patient Details  Name: [name] MRN: [id number] Date of Birth: 09-Nov-1975 Referring Provider (PT): McLean-Scocuzza, Olivia Mackie; Melvenia Beam, MD   Encounter Date: 11/03/2019  PT End of Session - 11/03/19 1221    Visit Number  13    Number of Visits  19    Date for PT Re-Evaluation  11/18/19   PN completed 10/17/19   Authorization Type  Primary: Medicare Secondary: BCBS Tertiary: Medicaid of [place]    Authorization Time Period  09/08/19- 10/20/19; 10/17/19-11/18/19    Authorization - Visit Number  3    Authorization - Number of Visits  10    PT Start Time  1142   pt was late for appt   PT Stop Time  1220    PT Time Calculation (min)  38 min    Activity Tolerance  Patient tolerated treatment well    Behavior During Therapy  [facility] for tasks assessed/performed       Past Medical History:  Diagnosis Date  . Abdominal pain, epigastric 12/17/2015  . Anxiety    pt denies having dx of anxiety  . BCC (basal cell carcinoma of skin)    right foot   . Bunion, right foot   . Chronic cough   . Closed fibular fracture 10/2008   Left - Sustained 2/2 fall down stairs (same fall as tibular fracture). S/P internal fixation.  . Duodenitis    EGD (07/14/2011) showing chronic duodenitis, H.pyloir neg  . Elevated liver enzymes   . Endometriosis    S/P TAH and salpingo-oophorectomy, right  . Epigastric pain    Chronic, thought 2/2 gastritis, EGD (07/14/2011) showing chronic duodenitis, H.pyloir neg // Repeat EGD (09/2011) - esophagealring, sessible polyp in stomach body, hiatal hernia - rec ad probiotic, await bx  . Esophageal ring    s/p dilatation during EGD (07/2011) - Dr. Oneida Alar  . Fatty liver   . Gastritis    Thought due to chronic NSAID use 2/2 pain from her congenital hip abnormality  . Headache(784.0)    Migraines  .  Hidradenitis suppurativa 05/2006  . 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 With chronic OA of hip  . History of migraine headaches    Typical symptoms - bright lights, unilateral (starting behind her left ear), throbbing .  Marland Kitchen Hot flashes   . Hypertension   . Internal hemorrhoids   . Osteoarthritis of hip   . Ovarian cancer ([place]) 1995  . Protrusio acetabuli    diagnosed at age 6  . Sessile colonic polyp    noted 09/2011 -- > rec colonoscopy in 10 years, 2023  . Surgical menopause 08/2007   On HRT. Occuring since 01/09 following TAH and R-salpingo-oophorectomy   . Tibial fracture 10/2008   Left - Sustained 2/2 fall down stairs. Patient is S/P closed internal fixation with Smith Nephew tibial nail locked proximal and distal  . Vitamin D deficiency 11/2009   Vit D level 15 in 11/2009    Past Surgical History:  Procedure Laterality Date  . ABDOMINAL HYSTERECTOMY    . CESAREAN SECTION  08/2005   Primary low transverse  . COLONOSCOPY  09/22/2011   [id number] polyp in the rectum/Internal hemorrhoids, benign path  . ESOPHAGOGASTRODUODENOSCOPY  09/22/2011   [id number], esophageal/Sessile polyp in the body of the stomach/Hiatal  hernia/ABDOMINAL PAIN LIKELY FUNCTIONAL V. POST-INFECTIOUS IBS, path: mild chronic gastritis  . FRACTURE SURGERY     Right hip  . JOINT REPLACEMENT     right hip  . LAPAROSCOPY  10/2000   Operative laparoscopy with lysis of right adnexal adhesions and uterointestinal adhesions  . left tib fib    . OTHER SURGICAL HISTORY  02/2009   Closed treatment internal fixation, left tibia with Tamala Julian and Nephew tibial nail locked proximal and distal  . TOTAL ABDOMINAL HYSTERECTOMY W/ BILATERAL SALPINGOOPHORECTOMY  08/2007   for endometriosis. With concurrent right salpingo-oophorectomy  (2009), left oophorectomy (1995)  . TUBAL LIGATION  08/2005   Right tubal ligation    There were no vitals filed for this  visit.  Subjective Assessment - 11/03/19 1148    Subjective  pt states her Lt side of her neck is bothering her most but her LT groin also conitnues to hurt.  States it is still around a 5/10    Currently in Pain?  Yes    Pain Score  5     Pain Location  Neck    Pain Orientation  Left    Pain Descriptors / Indicators  [facility] Adult PT Treatment/Exercise - 11/03/19 0001      Ambulation/Gait   Ambulation/Gait  Yes    Ambulation/Gait Assistance  6: Modified independent (Device/Increase time)    Ambulation Distance (Feet)  150 Feet    Assistive device  Straight cane    Gait Pattern  Step-to pattern;Decreased hip/knee flexion - left;Antalgic;Decreased step length - left;Decreased stance time - right;Trunk flexed    Ambulation Surface  Level    Stairs  Yes    Stairs Assistance  6: Modified independent (Device/Increase time)    Stair Management Technique  One rail Right;With cane    Number of Stairs  7    Height of Stairs  7      Neck Exercises: Seated   Cervical Isometrics  Right lateral flexion;Left lateral flexion;10 secs;5 reps    Neck Retraction  10 reps    Postural Training  upper trap stretch 2X30"    Other Seated Exercise  cervical excursions 5 reps each      Manual Therapy   Manual Therapy  Soft tissue mobilization    Manual therapy comments  Manual complete separate than rest of tx    Soft tissue mobilization  IASTM using theragun to bilateral upper trap, levator, and scapular borders for pain and improved mobility - large ball head used, setting # 14               PT Short Term Goals - 10/17/19 1109      PT SHORT TERM GOAL #1   Title  Patient will be independant with initial HEP to improve functional outcomes    Time  3    Period  Weeks    Status  Achieved    Target Date  09/29/19      PT SHORT TERM GOAL #2   Title  Patient will report less than 5/10 pain globally, on average, at rest, to improve daily function     Baseline  Current: 6/10 average pain    Time  3    Period  Weeks    Status  On-going    Target Date  09/29/19        PT Long  Term Goals - 10/17/19 1110      PT LONG TERM GOAL #1   Title  Patient will report at least 50% improvement in cervical symptoms for improved quality of life.    Baseline  Current: "almost" 50% improvement    Time  6    Period  Weeks    Status  On-going      PT LONG TERM GOAL #2   Title  Patient will improve cervical spine ROM in all planes by 50% in order to be able to drive safely.    Baseline  See ROM    Time  6    Period  Weeks    Status  On-going      PT LONG TERM GOAL #3   Title  Patient will experience neck/headache pain less than 3/10 in order to improve sleep quality.    Baseline  Current: 7/10 pain but down to 1 x headache per week    Time  6    Period  Weeks    Status  On-going            Plan - 11/03/19 1222    Clinical Impression Statement  Pt shown late for appt today.  Contiues to show antalgia towards Rt LE though complains with pain on Lt LE.  Attempted to gait train properly using SPC, however pt unable to complete in proper form.  states her neck pain is better for a day or so after she receives therapy then returns. Able to negotiate 7" stairs in step to pattern leading with Rt LE both ways.  Added cervical isometrics without difficulty but did not add ball stretch due to flexion contraindications.   pt reported no change in Lt groin at EOS, however did have improvement in cervical following massage gun.    Personal Factors and Comorbidities  Comorbidity 2;Past/Current Experience;Time since onset of injury/illness/exacerbation    Comorbidities  Globalized pain in multiple sites    Examination-Activity Limitations  Bed Mobility;Sleep;Bend;Squat;Bathing;Sit;Carry;Stand;Dressing;Transfers;Lift;Locomotion Level;Stairs;Reach Overhead    Examination-Participation Restrictions  Cleaning;Community Activity;[employer]     Stability/Clinical Decision Making  Evolving/Moderate complexity    Rehab Potential  Fair    PT Frequency  2x / week    PT Duration  4 weeks    PT Treatment/Interventions  ADLs/Self Care Home Management;DME Instruction;Biofeedback;Gait training;Stair training;Cryotherapy;Electrical Stimulation;Functional mobility training;Therapeutic activities;Moist Heat;Therapeutic exercise;Balance training;Manual techniques;Taping;Wheelchair mobility training;Orthotic Fit/Training;Patient/family education;Energy conservation;Joint Manipulations;Spinal Manipulations;Passive range of motion;Neuromuscular re-education;Aquatic Therapy;Traction;Ultrasound;Splinting;Vestibular    PT Next Visit Plan  continue with walking with cane and stair training. Continue to progress cervical and spine mobility as well as postural strengthening as tolerated by pain. Try postural strengthening with theraband.    PT Home Exercise Plan  2/3 deep breathing and body scan  10/03/19: LAQ    Consulted and Agree with Plan of Care  Patient       Patient will benefit from skilled therapeutic intervention in order to improve the following deficits and impairments:  Abnormal gait, Decreased endurance, Decreased strength, Decreased activity tolerance, Decreased balance, Decreased mobility, Difficulty walking, Pain, Decreased range of motion, Impaired flexibility, Improper body mechanics, Postural dysfunction, Dizziness, Increased fascial restricitons, Increased muscle spasms, Impaired perceived functional ability  Visit Diagnosis: Acute left-sided low back pain without sciatica  Muscle weakness (generalized)  Other abnormalities of gait and mobility  Cervicalgia  Pain in right hip     Problem List Patient Active Problem List   Diagnosis Date Noted  . Concussion with no loss of consciousness 08/12/2018  .  Acute pain of left shoulder 08/12/2018  . Low back pain 08/12/2018  . Allergic rhinitis 08/05/2018  . Arthritis 07/01/2018  .  DDD (degenerative disc disease), lumbar 05/21/2018  . Thoracic arthritis 05/21/2018  . DDD (degenerative disc disease), cervical 01/06/2018  . Hidradenitis suppurativa 10/11/2017  . Chronic frontal sinusitis 10/11/2017  . Fatty liver 10/11/2017  . Chronic ethmoidal sinusitis 09/13/2017  . Photophobia of both eyes 09/13/2017  . Abnormal liver enzymes 09/13/2017  . History of hysterectomy with bilateral oophorectomy 06/24/2017  . Estrogen deficiency 06/24/2017  . Overweight 11/17/2016  . Abdominal pain 11/04/2016  . Headache 12/17/2015  . Chronic constipation 10/12/2012  . Hot flashes 02/20/2012  . Hiatal hernia 02/20/2012  . Chronic pain of both hips 02/20/2012  . Fatigue 12/19/2011  . Acetabulum intrapelvic protrusion into pelvic region or thigh 05/15/2011  . Annual physical exam 10/03/2010  . Hypertension 03/20/2010  . Vitamin D deficiency 08/20/2009  . Anxiety 05/18/2009  . Migraine headache 05/13/2006   Teena Irani, PTA/CLT [phone]  Teena Irani 11/03/2019, 12:31 PM  [facility] [place], [place], [address] Phone: [phone]   Fax:  [phone]  Name: [name] MRN: [id number] Date of Birth: 10/18/1975

## 2019-11-07 ENCOUNTER — Ambulatory Visit (HOSPITAL_COMMUNITY): Payer: BC Managed Care – PPO | Admitting: Physical Therapy

## 2019-11-09 ENCOUNTER — Ambulatory Visit (HOSPITAL_COMMUNITY): Payer: BC Managed Care – PPO | Admitting: Physical Therapy

## 2019-11-09 ENCOUNTER — Other Ambulatory Visit: Payer: Self-pay

## 2019-11-09 ENCOUNTER — Encounter (HOSPITAL_COMMUNITY): Payer: Self-pay | Admitting: Physical Therapy

## 2019-11-09 DIAGNOSIS — M545 Low back pain, unspecified: Secondary | ICD-10-CM

## 2019-11-09 DIAGNOSIS — M25551 Pain in right hip: Secondary | ICD-10-CM | POA: Diagnosis not present

## 2019-11-09 DIAGNOSIS — R2689 Other abnormalities of gait and mobility: Secondary | ICD-10-CM | POA: Diagnosis not present

## 2019-11-09 DIAGNOSIS — M542 Cervicalgia: Secondary | ICD-10-CM | POA: Diagnosis not present

## 2019-11-09 DIAGNOSIS — R41844 Frontal lobe and executive function deficit: Secondary | ICD-10-CM | POA: Diagnosis not present

## 2019-11-09 DIAGNOSIS — R41840 Attention and concentration deficit: Secondary | ICD-10-CM | POA: Diagnosis not present

## 2019-11-09 DIAGNOSIS — M6281 Muscle weakness (generalized): Secondary | ICD-10-CM | POA: Diagnosis not present

## 2019-11-09 NOTE — Therapy (Signed)
[facility] [address] [place], [place], [address] Phone: [phone]   Fax:  [phone]  Physical Therapy Treatment  Patient Details  Name: [name] MRN: [id number] Date of Birth: [birth year] Referring Provider (PT): McLean-Scocuzza, Olivia Mackie; Melvenia Beam, MD   Encounter Date: 11/09/2019  PT End of Session - 11/09/19 1040    Visit Number  14    Number of Visits  19    Date for PT Re-Evaluation  11/18/19   PN completed 10/17/19   Authorization Type  Primary: Medicare Secondary: BCBS Tertiary: Medicaid of [place]    Authorization Time Period  09/08/19- 10/20/19; 10/17/19-11/18/19    Authorization - Visit Number  14    Authorization - Number of Visits  10    PT Start Time  1033    PT Stop Time  1114    PT Time Calculation (min)  41 min    Activity Tolerance  Patient tolerated treatment well    Behavior During Therapy  WFL for tasks assessed/performed       Past Medical History:  Diagnosis Date  . Abdominal pain, epigastric 12/17/2015  . Anxiety    pt denies having dx of anxiety  . BCC (basal cell carcinoma of skin)    right foot   . Bunion, right foot   . Chronic cough   . Closed fibular fracture 10/2008   Left - Sustained 2/2 fall down stairs (same fall as tibular fracture). S/P internal fixation.  . Duodenitis    EGD (07/14/2011) showing chronic duodenitis, H.pyloir neg  . Elevated liver enzymes   . Endometriosis    S/P TAH and salpingo-oophorectomy, right  . Epigastric pain    Chronic, thought 2/2 gastritis, EGD (07/14/2011) showing chronic duodenitis, H.pyloir neg // Repeat EGD (09/2011) - esophagealring, sessible polyp in stomach body, hiatal hernia - rec ad probiotic, await bx  . Esophageal ring    s/p dilatation during EGD (07/2011) - Dr. Oneida Alar  . Fatty liver   . Gastritis    Thought due to chronic NSAID use 2/2 pain from her congenital hip abnormality  . Headache(784.0)    Migraines  . Hidradenitis suppurativa  05/2006  . 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 With chronic OA of hip  . History of migraine headaches    Typical symptoms - bright lights, unilateral (starting behind her left ear), throbbing .  Marland Kitchen Hot flashes   . Hypertension   . Internal hemorrhoids   . Osteoarthritis of hip   . Ovarian cancer ([place]) 1995  . Protrusio acetabuli    diagnosed at age 43  . Sessile colonic polyp    noted 09/2011 -- > rec colonoscopy in 10 years, 2023  . Surgical menopause 08/2007   On HRT. Occuring since 01/09 following TAH and R-salpingo-oophorectomy   . Tibial fracture 10/2008   Left - Sustained 2/2 fall down stairs. Patient is S/P closed internal fixation with Smith Nephew tibial nail locked proximal and distal  . Vitamin D deficiency 11/2009   Vit D level 15 in 11/2009    Past Surgical History:  Procedure Laterality Date  . ABDOMINAL HYSTERECTOMY    . CESAREAN SECTION  08/2005   Primary low transverse  . COLONOSCOPY  09/22/2011   [id number] polyp in the rectum/Internal hemorrhoids, benign path  . ESOPHAGOGASTRODUODENOSCOPY  09/22/2011   [id number], esophageal/Sessile polyp in the body of the stomach/Hiatal hernia/ABDOMINAL PAIN LIKELY FUNCTIONAL V. POST-INFECTIOUS  IBS, path: mild chronic gastritis  . FRACTURE SURGERY     Right hip  . JOINT REPLACEMENT     right hip  . LAPAROSCOPY  10/2000   Operative laparoscopy with lysis of right adnexal adhesions and uterointestinal adhesions  . left tib fib    . OTHER SURGICAL HISTORY  02/2009   Closed treatment internal fixation, left tibia with Tamala Julian and Nephew tibial nail locked proximal and distal  . TOTAL ABDOMINAL HYSTERECTOMY W/ BILATERAL SALPINGOOPHORECTOMY  08/2007   for endometriosis. With concurrent right salpingo-oophorectomy  (2009), left oophorectomy (1995)  . TUBAL LIGATION  08/2005   Right tubal ligation    There were no vitals filed for this visit.  Subjective  Assessment - 11/09/19 1035    Subjective  Patient states she has been doing pretty good. Her neck is giving her the most trouble. She continues to have difficulty turning her head and is limited by pain and stiffness. Her home exercises have been going well.    Currently in Pain?  Yes    Pain Score  5     Pain Location  Neck                       [facility] Adult PT Treatment/Exercise - 11/09/19 0001      Neck Exercises: Standing   Other Standing Exercises  standing row 3x10      Neck Exercises: Seated   Neck Retraction  10 reps    Postural Training  upper trap stretch 3X30"      Manual Therapy   Manual Therapy  Soft tissue mobilization    Manual therapy comments  Manual complete separate than rest of tx    Soft tissue mobilization  IASTM using theragun to bilateral upper trap, levator, and scapular borders for pain and improved mobility - large ball head used, setting # 14             PT Education - 11/09/19 1040    Education Details  Educated on hep, review of HEP    Person(s) Educated  Patient    Methods  Explanation    Comprehension  Verbalized understanding       PT Short Term Goals - 10/17/19 1109      PT SHORT TERM GOAL #1   Title  Patient will be independant with initial HEP to improve functional outcomes    Time  3    Period  Weeks    Status  Achieved    Target Date  09/29/19      PT SHORT TERM GOAL #2   Title  Patient will report less than 5/10 pain globally, on average, at rest, to improve daily function    Baseline  Current: 6/10 average pain    Time  3    Period  Weeks    Status  On-going    Target Date  09/29/19        PT Long Term Goals - 10/17/19 1110      PT LONG TERM GOAL #1   Title  Patient will report at least 50% improvement in cervical symptoms for improved quality of life.    Baseline  Current: "almost" 50% improvement    Time  6    Period  Weeks    Status  On-going      PT LONG TERM GOAL #2   Title  Patient will  improve cervical spine ROM in all planes by 50% in order to be  able to drive safely.    Baseline  See ROM    Time  6    Period  Weeks    Status  On-going      PT LONG TERM GOAL #3   Title  Patient will experience neck/headache pain less than 3/10 in order to improve sleep quality.    Baseline  Current: 7/10 pain but down to 1 x headache per week    Time  6    Period  Weeks    Status  On-going            Plan - 11/09/19 1115    Clinical Impression Statement  Patient experiences a reduction in symptoms following manual therapy and she notes she feels really good after. She demonstrates good form with standing row exercise for postural strengthening and requires stand by assist for balance and safety. She requires some verbal cueing for decreasing intensity of stretching today as well as for mechanics. She completes seated retractions with good mechanics but is instructed not to go into protrusion following retraction. Patient will continue to benefit from skilled physical therapy in order to improve function and reduce impairment.    Personal Factors and Comorbidities  Comorbidity 2;Past/Current Experience;Time since onset of injury/illness/exacerbation    Comorbidities  Globalized pain in multiple sites    Examination-Activity Limitations  Bed Mobility;Sleep;Bend;Squat;Bathing;Sit;Carry;Stand;Dressing;Transfers;Lift;Locomotion Level;Stairs;Reach Overhead    Examination-Participation Restrictions  Cleaning;Community Activity;[employer]    Stability/Clinical Decision Making  Evolving/Moderate complexity    Rehab Potential  Fair    PT Frequency  2x / week    PT Duration  4 weeks    PT Treatment/Interventions  ADLs/Self Care Home Management;DME Instruction;Biofeedback;Gait training;Stair training;Cryotherapy;Electrical Stimulation;Functional mobility training;Therapeutic activities;Moist Heat;Therapeutic exercise;Balance training;Manual techniques;Taping;Wheelchair mobility  training;Orthotic Fit/Training;Patient/family education;Energy conservation;Joint Manipulations;Spinal Manipulations;Passive range of motion;Neuromuscular re-education;Aquatic Therapy;Traction;Ultrasound;Splinting;Vestibular    PT Next Visit Plan  continue with walking with cane and stair training. Continue to progress cervical and spine mobility as well as postural strengthening as tolerated by pain. continue postural strengthening with theraband.    PT Home Exercise Plan  2/3 deep breathing and body scan  10/03/19: LAQ 11/09/19 UT stretch    Consulted and Agree with Plan of Care  Patient       Patient will benefit from skilled therapeutic intervention in order to improve the following deficits and impairments:  Abnormal gait, Decreased endurance, Decreased strength, Decreased activity tolerance, Decreased balance, Decreased mobility, Difficulty walking, Pain, Decreased range of motion, Impaired flexibility, Improper body mechanics, Postural dysfunction, Dizziness, Increased fascial restricitons, Increased muscle spasms, Impaired perceived functional ability  Visit Diagnosis: Acute left-sided low back pain without sciatica  Muscle weakness (generalized)  Other abnormalities of gait and mobility  Cervicalgia  Pain in right hip     Problem List Patient Active Problem List   Diagnosis Date Noted  . Concussion with no loss of consciousness 08/12/2018  . Acute pain of left shoulder 08/12/2018  . Low back pain 08/12/2018  . Allergic rhinitis 08/05/2018  . Arthritis 07/01/2018  . DDD (degenerative disc disease), lumbar 05/21/2018  . Thoracic arthritis 05/21/2018  . DDD (degenerative disc disease), cervical 01/06/2018  . Hidradenitis suppurativa 10/11/2017  . Chronic frontal sinusitis 10/11/2017  . Fatty liver 10/11/2017  . Chronic ethmoidal sinusitis 09/13/2017  . Photophobia of both eyes 09/13/2017  . Abnormal liver enzymes 09/13/2017  . History of hysterectomy with bilateral  oophorectomy 06/24/2017  . Estrogen deficiency 06/24/2017  . Overweight 11/17/2016  . Abdominal pain 11/04/2016  . Headache 12/17/2015  .  Chronic constipation 10/12/2012  . Hot flashes 02/20/2012  . Hiatal hernia 02/20/2012  . Chronic pain of both hips 02/20/2012  . Fatigue 12/19/2011  . Acetabulum intrapelvic protrusion into pelvic region or thigh 05/15/2011  . Annual physical exam 10/03/2010  . Hypertension 03/20/2010  . Vitamin D deficiency 08/20/2009  . Anxiety 05/18/2009  . Migraine headache 05/13/2006    11:17 AM, 11/09/19 Mearl Latin PT, DPT Physical Therapist at [facility] [place], [place], [address] Phone: [phone]   Fax:  [phone]  Name: [name] MRN: [id number] Date of Birth: Feb 25, 1976

## 2019-11-14 ENCOUNTER — Ambulatory Visit (HOSPITAL_COMMUNITY): Payer: BC Managed Care – PPO | Admitting: Physical Therapy

## 2019-11-14 ENCOUNTER — Telehealth (HOSPITAL_COMMUNITY): Payer: Self-pay | Admitting: Physical Therapy

## 2019-11-14 NOTE — Telephone Encounter (Signed)
pt called to say that she woke up late she is cancelling

## 2019-11-15 ENCOUNTER — Encounter: Payer: Self-pay | Admitting: Internal Medicine

## 2019-11-15 ENCOUNTER — Other Ambulatory Visit: Payer: Self-pay

## 2019-11-15 ENCOUNTER — Ambulatory Visit (INDEPENDENT_AMBULATORY_CARE_PROVIDER_SITE_OTHER): Payer: BC Managed Care – PPO | Admitting: Internal Medicine

## 2019-11-15 VITALS — BP 124/82 | HR 80 | Temp 97.9°F | Ht 72.0 in | Wt 198.0 lb

## 2019-11-15 DIAGNOSIS — M542 Cervicalgia: Secondary | ICD-10-CM

## 2019-11-15 DIAGNOSIS — E663 Overweight: Secondary | ICD-10-CM

## 2019-11-15 DIAGNOSIS — I10 Essential (primary) hypertension: Secondary | ICD-10-CM | POA: Diagnosis not present
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
## 2019-11-15 NOTE — Patient Instructions (Addendum)
Consider Dr. Sharlet Salina to discuss options for your chronic neck pain ? If he does dry needling?   Phone Fax E-mail Address  [phone] Not available [place] [phone]    The [employer] 64 Days online nutrition program  [facility]  [address] [place], [place] [address]  [phone]  [place], [place], [place] [address]  [phone]  [phone] (Fax)  hydrogen breath test   -reach out to GI about nutrition services   Call [facility] to schedule mammogram   Nasal saline before flonase 1-2 sprays each nose  Xyzal, or zyrtec or allegra or claritin allergy pills over the counter     Epidural Steroid Injection Patient Information  Description: The epidural space surrounds the nerves as they exit the spinal cord.  In some patients, the nerves can be compressed and inflamed by a bulging disc or a tight spinal canal (spinal stenosis).  By injecting steroids into the epidural space, we can bring irritated nerves into direct contact with a potentially helpful medication.  These steroids act directly on the irritated nerves and can reduce swelling and inflammation which often leads to decreased pain.  Epidural steroids may be injected anywhere along the spine and from the neck to the low back depending upon the location of your pain.   After numbing the skin with local anesthetic (like Novocaine), a small needle is passed into the epidural space slowly.  You may experience a sensation of pressure while this is being done.  The entire block usually last less than 10 minutes.  Conditions which may be treated by epidural steroids:   Low back and leg pain  Neck and arm pain  Spinal stenosis  Post-laminectomy syndrome  Herpes zoster (shingles) pain  Pain from compression fractures  Preparation for the injection:  1. Do not eat any solid food or dairy products within 8  hours of your appointment.  2. You may drink clear liquids up to 3 hours before appointment.  Clear liquids include water, black coffee, juice or soda.  No milk or cream please. 3. You may take your regular medication, including pain medications, with a sip of water before your appointment  Diabetics should hold regular insulin (if taken separately) and take 1/2 normal NPH dos the morning of the procedure.  Carry some sugar containing items with you to your appointment. 4. A driver must accompany you and be prepared to drive you home after your procedure.  5. Bring all your current medications with your. 6. An IV may be inserted and sedation may be given at the discretion of the physician.   7. A blood pressure cuff, EKG and other monitors will often be applied during the procedure.  Some patients may need to have extra oxygen administered for a short period. 8. You will be asked to provide medical information, including your allergies, prior to the procedure.  We must know immediately if you are taking blood thinners (like Coumadin/Warfarin)  Or if you are allergic to IV iodine contrast (dye). We must know if you could possible be pregnant.  Possible side-effects:  Bleeding from needle site  Infection (rare, may require surgery)  Nerve injury (rare)  Numbness & tingling (temporary)  Difficulty urinating (rare, temporary)  Spinal headache ( a headache worse with upright posture)  Light -headedness (temporary)  Pain at injection site (several days)  Decreased blood pressure (temporary)  Weakness in arm/leg (temporary)  Pressure sensation in back/neck (temporary)  Call if you experience:  Fever/chills associated with headache or increased back/neck pain.  Headache worsened by an upright position.  New onset weakness or numbness of an extremity below the injection site  Hives or difficulty breathing (go to the emergency room)  Inflammation or drainage at the infection  site  Severe back/neck pain  Any new symptoms which are concerning to you  Please note:  Although the local anesthetic injected can often make your back or neck feel good for several hours after the injection, the pain will likely return.  It takes 3-7 days for steroids to work in the epidural space.  You may not notice any pain relief for at least that one week.  If effective, we will often do a series of three injections spaced 3-6 weeks apart to maximally decrease your pain.  After the initial series, we generally will wait several months before considering a repeat injection of the same type.  If you have any questions, please call [phone] [facility]

## 2019-11-16 ENCOUNTER — Encounter (HOSPITAL_COMMUNITY): Payer: Self-pay | Admitting: Physical Therapy

## 2019-11-16 ENCOUNTER — Ambulatory Visit (HOSPITAL_COMMUNITY): Payer: BC Managed Care – PPO | Attending: Neurology | Admitting: Physical Therapy

## 2019-11-16 ENCOUNTER — Telehealth: Payer: Self-pay | Admitting: Internal Medicine

## 2019-11-16 DIAGNOSIS — M6281 Muscle weakness (generalized): Secondary | ICD-10-CM | POA: Insufficient documentation

## 2019-11-16 DIAGNOSIS — M545 Low back pain, unspecified: Secondary | ICD-10-CM

## 2019-11-16 DIAGNOSIS — R2689 Other abnormalities of gait and mobility: Secondary | ICD-10-CM | POA: Insufficient documentation

## 2019-11-16 DIAGNOSIS — M542 Cervicalgia: Secondary | ICD-10-CM

## 2019-11-16 DIAGNOSIS — M25551 Pain in right hip: Secondary | ICD-10-CM | POA: Diagnosis not present

## 2019-11-16 NOTE — Telephone Encounter (Signed)
Inform pt dry needling location  [facility] [address]    To order dry needling for cervical myofascial pain syndrome in comments for PT. I have also heard that a place called [facility] is awesome, the owner does it and sometimes a woman called Donnetta Simpers does it there too and both are great.  Pt to let me know later if wants to go to any of these locations  [place]

## 2019-11-16 NOTE — Therapy (Signed)
[place], [place], [address] Phone: [phone]   Fax:  [phone]  Physical Therapy Treatment/Progress note/ Recert  Patient Details  Name: [name] MRN: [id number] Date of Birth: 09/20/1975 Referring Provider (PT): McLean-Scocuzza, Olivia Mackie; Melvenia Beam, MD   Progress Note   Reporting Period 10/17/19 to 11/16/19   See note below for Objective Data and Assessment of Progress/Goals  Encounter Date: 11/16/2019  PT End of Session - 11/16/19 1114    Visit Number  15    Number of Visits  25    Date for PT Re-Evaluation  12/07/19   PN completed 10/17/19, 11/16/19   Authorization Type  Primary: Medicare Secondary: BCBS Tertiary: Medicaid of [place]    Authorization Time Period  09/08/19- 10/20/19; 10/17/19-11/18/19, 11/16/19-12/07/19    PT Start Time  1032    PT Stop Time  1112    PT Time Calculation (min)  40 min    Activity Tolerance  Patient tolerated treatment well    Behavior During Therapy  [facility] for tasks assessed/performed       Past Medical History:  Diagnosis Date  . Abdominal pain, epigastric 12/17/2015  . Anxiety    pt denies having dx of anxiety  . BCC (basal cell carcinoma of skin)    right foot   . Bunion, right foot   . Chronic cough   . Closed fibular fracture 10/2008   Left - Sustained 2/2 fall down stairs (same fall as tibular fracture). S/P internal fixation.  . Duodenitis    EGD (07/14/2011) showing chronic duodenitis, H.pyloir neg  . Elevated liver enzymes   . Endometriosis    S/P TAH and salpingo-oophorectomy, right  . Epigastric pain    Chronic, thought 2/2 gastritis, EGD (07/14/2011) showing chronic duodenitis, H.pyloir neg // Repeat EGD (09/2011) - esophagealring, sessible polyp in stomach body, hiatal hernia - rec ad probiotic, await bx  . Esophageal ring    s/p dilatation during EGD (07/2011) - Dr. Oneida Alar  . Fatty liver   . Gastritis    Thought due to chronic NSAID use 2/2 pain from  her congenital hip abnormality  . Headache(784.0)    Migraines  . Hidradenitis suppurativa 05/2006  . 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 With chronic OA of hip  . History of migraine headaches    Typical symptoms - bright lights, unilateral (starting behind her left ear), throbbing .  Marland Kitchen Hot flashes   . Hypertension   . Internal hemorrhoids   . Osteoarthritis of hip   . Ovarian cancer ([place]) 1995  . Protrusio acetabuli    diagnosed at age 25  . Sessile colonic polyp    noted 09/2011 -- > rec colonoscopy in 10 years, 2023  . Surgical menopause 08/2007   On HRT. Occuring since 01/09 following TAH and R-salpingo-oophorectomy   . Tibial fracture 10/2008   Left - Sustained 2/2 fall down stairs. Patient is S/P closed internal fixation with Smith Nephew tibial nail locked proximal and distal  . Vitamin D deficiency 11/2009   Vit D level 15 in 11/2009    Past Surgical History:  Procedure Laterality Date  . ABDOMINAL HYSTERECTOMY    . CESAREAN SECTION  08/2005   Primary low transverse  . COLONOSCOPY  09/22/2011   TDH:RCBULAG polyp in the rectum/Internal hemorrhoids, benign path  . ESOPHAGOGASTRODUODENOSCOPY  09/22/2011   TXM:IWOE, esophageal/Sessile polyp in the body of the  stomach/Hiatal hernia/ABDOMINAL PAIN LIKELY FUNCTIONAL V. POST-INFECTIOUS IBS, path: mild chronic gastritis  . FRACTURE SURGERY     Right hip  . JOINT REPLACEMENT     right hip  . LAPAROSCOPY  10/2000   Operative laparoscopy with lysis of right adnexal adhesions and uterointestinal adhesions  . left tib fib    . OTHER SURGICAL HISTORY  02/2009   Closed treatment internal fixation, left tibia with Tamala Julian and Nephew tibial nail locked proximal and distal  . TOTAL ABDOMINAL HYSTERECTOMY W/ BILATERAL SALPINGOOPHORECTOMY  08/2007   for endometriosis. With concurrent right salpingo-oophorectomy  (2009), left oophorectomy (1995)  . TUBAL LIGATION   08/2005   Right tubal ligation    There were no vitals filed for this visit.  Subjective Assessment - 11/16/19 1044    Subjective  Patient reports she feels a while lot better but she is still concerned with her neck. She still has most difficulty with turning her head and pain in her upper traps. She feel about 90% improvement with physical therapy intervention. Sleeping has improved and she is trying to do things taught to her before to sleep better.    Pertinent History  Bilateral THA    Limitations  Sitting;Lifting;Standing;Walking;House hold activities    Currently in Pain?  Yes    Pain Score  4     Pain Location  Neck         [facility] PT Assessment - 11/16/19 0001      Assessment   Medical Diagnosis  Neck pain, whiplash injuries    Referring Provider (PT)  McLean-Scocuzza, Olivia Mackie; Melvenia Beam, MD    Onset Date/Surgical Date  05/25/19    Prior Therapy  yes      Precautions   Precautions  Fall      Restrictions   Weight Bearing Restrictions  No      Balance Screen   Has the patient fallen in the past 6 months  No    Has the patient had a decrease in activity level because of a fear of falling?   Yes    Is the patient reluctant to leave their home because of a fear of falling?   Yes      Prior Function   Level of Independence  Independent with basic ADLs    Vocation  On disability      Cognition   Overall Cognitive Status  Within Functional Limits for tasks assessed      AROM   Cervical Flexion  25% limited, minimal pain, slow movement    Cervical Extension  25% limited, minimal pain, slow movement    Cervical - Right Side Bend  25% limited, minimal pain, slow movement    Cervical - Left Side Bend  25% limited, minimal pain, slow movement    Cervical - Right Rotation  25% limited, minimal pain, slow movement    Cervical - Left Rotation  25% limited, minimal pain, slow movement      Palpation   Palpation comment  Tenderness at bilateral UT, periscapular  musculature                   [facility] Adult PT Treatment/Exercise - 11/16/19 0001      Manual Therapy   Manual Therapy  Soft tissue mobilization    Manual therapy comments  Manual complete separate than rest of tx    Soft tissue mobilization  self STM with cane to UT, periscapular and paraspinalsIASTM using theragun to bilateral upper trap,  levator, and scapular borders for pain and improved mobility - large ball head used, setting # 14             PT Education - 11/16/19 1043    Education Details  educated on exam findings, extending POC, HEP, review of posture, self STM with cane    Person(s) Educated  Patient    Methods  Explanation;Demonstration    Comprehension  Verbalized understanding;Returned demonstration       PT Short Term Goals - 11/16/19 1039      PT SHORT TERM GOAL #1   Title  Patient will be independant with initial HEP to improve functional outcomes    Time  3    Period  Weeks    Status  Achieved    Target Date  09/29/19      PT SHORT TERM GOAL #2   Title  Patient will report less than 5/10 pain globally, on average, at rest, to improve daily function    Baseline  Current: 4/10 average pain    Time  3    Period  Weeks    Status  Achieved    Target Date  09/29/19        PT Long Term Goals - 11/16/19 1040      PT LONG TERM GOAL #1   Title  Patient will report at least 50% improvement in cervical symptoms for improved quality of life.    Baseline  Current: 90% neck, 95% back improvement    Time  6    Period  Weeks    Status  Achieved      PT LONG TERM GOAL #2   Title  Patient will improve cervical spine ROM in all planes by 50% in order to be able to drive safely.    Baseline  See ROM    Time  6    Period  Weeks    Status  Achieved      PT LONG TERM GOAL #3   Title  Patient will experience neck/headache pain less than 3/10 in order to improve sleep quality.    Baseline  Current: 4/10 pain but down to 1 x headache per week at 7/10     Time  6    Period  Weeks    Status  On-going            Plan - 11/16/19 1116    Clinical Impression Statement  Patient has met 2/2 short term goals with ability to complete HEP and decreased symptoms. She has met 2/3 long term goals with improved ROM, and overall percent improved. She continues to be limited by neck pain mainly with rotation and at end range positions as well has intermittent headaches. Patient not able to sleep in her bed at this time due to pain and fear of climbing stairs due to LE weakness. She has improved significantly in strength, ROM, functional mobility and decreased pain. Patient feels improvement following manual therapy intervention and demonstrates muscle stiffness and pain today in upper traps, paraspinals and periscapular musculatures. Patient will benefit from extending POC to address cervical spine stiffness, muscle tension, headaches and possibly stairs/LE strengthening so patient can return to her bed for sleeping and complete ADL such as driving with improved ease. Patient will continue to benefit from skilled physical therapy in order to reduce impairment and improve function.    Personal Factors and Comorbidities  Comorbidity 2;Past/Current Experience;Time since onset of injury/illness/exacerbation    Comorbidities  Globalized pain  in multiple sites    Examination-Activity Limitations  Bed Mobility;Sleep;Bend;Squat;Bathing;Sit;Carry;Stand;Dressing;Transfers;Lift;Locomotion Level;Stairs;Reach Overhead    Examination-Participation Restrictions  Cleaning;Community Activity;[facility]    Stability/Clinical Decision Making  --    Rehab Potential  Fair    PT Frequency  2x / week    PT Duration  3 weeks    PT Treatment/Interventions  ADLs/Self Care Home Management;DME Instruction;Biofeedback;Gait training;Stair training;Cryotherapy;Electrical Stimulation;Functional mobility training;Therapeutic activities;Moist Heat;Therapeutic exercise;Balance  training;Manual techniques;Taping;Wheelchair mobility training;Orthotic Fit/Training;Patient/family education;Energy conservation;Joint Manipulations;Spinal Manipulations;Passive range of motion;Neuromuscular re-education;Aquatic Therapy;Traction;Ultrasound;Splinting;Vestibular    PT Next Visit Plan  continue with walking with cane and stair training to assist in confidence to return to bed for sleeping. Continue to progress cervical and spine mobility as well as postural strengthening as tolerated by pain. continue postural strengthening with theraband. manual therapy as needed for muscular tension in UT, periscapulars.    PT Home Exercise Plan  2/3 deep breathing and body scan  10/03/19: LAQ 11/09/19 UT stretch 11/16/19 self stm with cane    Consulted and Agree with Plan of Care  Patient       Patient will benefit from skilled therapeutic intervention in order to improve the following deficits and impairments:  Abnormal gait, Decreased endurance, Decreased strength, Decreased activity tolerance, Decreased balance, Decreased mobility, Difficulty walking, Pain, Decreased range of motion, Impaired flexibility, Improper body mechanics, Postural dysfunction, Dizziness, Increased fascial restricitons, Increased muscle spasms, Impaired perceived functional ability  Visit Diagnosis: Acute left-sided low back pain without sciatica  Muscle weakness (generalized)  Other abnormalities of gait and mobility  Cervicalgia  Pain in right hip     Problem List Patient Active Problem List   Diagnosis Date Noted  . Concussion with no loss of consciousness 08/12/2018  . Acute pain of left shoulder 08/12/2018  . Low back pain 08/12/2018  . Allergic rhinitis 08/05/2018  . Arthritis 07/01/2018  . DDD (degenerative disc disease), lumbar 05/21/2018  . Thoracic arthritis 05/21/2018  . DDD (degenerative disc disease), cervical 01/06/2018  . Hidradenitis suppurativa 10/11/2017  . Chronic frontal sinusitis  10/11/2017  . Fatty liver 10/11/2017  . Chronic ethmoidal sinusitis 09/13/2017  . Photophobia of both eyes 09/13/2017  . Abnormal liver enzymes 09/13/2017  . History of hysterectomy with bilateral oophorectomy 06/24/2017  . Estrogen deficiency 06/24/2017  . Overweight (BMI 25.0-29.9) 11/17/2016  . Abdominal pain 11/04/2016  . Headache 12/17/2015  . Chronic constipation 10/12/2012  . Hot flashes 02/20/2012  . Hiatal hernia 02/20/2012  . Chronic pain of both hips 02/20/2012  . Fatigue 12/19/2011  . Acetabulum intrapelvic protrusion into pelvic region or thigh 05/15/2011  . Annual physical exam 10/03/2010  . Hypertension 03/20/2010  . Vitamin D deficiency 08/20/2009  . Anxiety 05/18/2009  . Migraine headache 05/13/2006    11:29 AM, 11/16/19 Mearl Latin PT, DPT Physical Therapist at [facility] [place], [place], [address] Phone: [phone]   Fax:  [phone]  Name: CHANIAH CISSE MRN: [id number] Date of Birth: 1975/08/20

## 2019-11-16 NOTE — Telephone Encounter (Signed)
Left message to return call 

## 2019-11-16 NOTE — Telephone Encounter (Signed)
-----   Message from Melvenia Beam, MD sent at 11/15/2019  2:29 PM EDT ----- [facility] [address] do it and [facility] may have it at other locations too but I usually send to either of these.  I just refer to PT and in the comments section say dry needling for cervical myofascial pain syndrome. I have also heard that a place called [facility] is awesome, the owner does it and sometimes a woman called Donnetta Simpers does it there too and both are great. I think a lot of other places offer it as well like [facility]. Hope this helps! ----- Message ----- From: McLean-Scocuzza, Nino Glow, MD Sent: 11/15/2019  11:01 AM EDT To: Melvenia Beam, MD  Who does dry needling in the area other than your office please?

## 2019-11-17 ENCOUNTER — Encounter (HOSPITAL_COMMUNITY): Payer: Self-pay | Admitting: Physical Therapy

## 2019-11-17 ENCOUNTER — Other Ambulatory Visit: Payer: Self-pay

## 2019-11-17 ENCOUNTER — Ambulatory Visit (HOSPITAL_COMMUNITY): Payer: BC Managed Care – PPO | Admitting: Physical Therapy

## 2019-11-17 DIAGNOSIS — R2689 Other abnormalities of gait and mobility: Secondary | ICD-10-CM

## 2019-11-17 DIAGNOSIS — M25551 Pain in right hip: Secondary | ICD-10-CM

## 2019-11-17 DIAGNOSIS — M545 Low back pain, unspecified: Secondary | ICD-10-CM

## 2019-11-17 DIAGNOSIS — M542 Cervicalgia: Secondary | ICD-10-CM | POA: Diagnosis not present

## 2019-11-17 DIAGNOSIS — M6281 Muscle weakness (generalized): Secondary | ICD-10-CM

## 2019-11-17 NOTE — Telephone Encounter (Signed)
Pt would like to be referred to [name].

## 2019-11-17 NOTE — Therapy (Signed)
[facility] [address] [place], [place], [address] Phone: [phone]   Fax:  [phone]  Physical Therapy Treatment  Patient Details  Name: [name] MRN: [id number] Date of Birth: September 12, 1975 Referring Provider (PT): McLean-Scocuzza, Olivia Mackie; Melvenia Beam, MD   Encounter Date: 11/17/2019  PT End of Session - 11/17/19 0908    Visit Number  16    Number of Visits  25    Date for PT Re-Evaluation  12/07/19   PN completed 10/17/19, 11/16/19   Authorization Type  Primary: Medicare Secondary: BCBS Tertiary: Medicaid of [place]    Authorization Time Period  09/08/19- 10/20/19; 10/17/19-11/18/19, 11/16/19-12/07/19    Progress Note Due on Visit  21    PT Start Time  0905    PT Stop Time  0945    PT Time Calculation (min)  40 min    Activity Tolerance  Patient tolerated treatment well    Behavior During Therapy  [facility] for tasks assessed/performed       Past Medical History:  Diagnosis Date  . Abdominal pain, epigastric 12/17/2015  . Anxiety    pt denies having dx of anxiety  . BCC (basal cell carcinoma of skin)    right foot   . Bunion, right foot   . Chronic cough   . Closed fibular fracture 10/2008   Left - Sustained 2/2 fall down stairs (same fall as tibular fracture). S/P internal fixation.  . Duodenitis    EGD (07/14/2011) showing chronic duodenitis, H.pyloir neg  . Elevated liver enzymes   . Endometriosis    S/P TAH and salpingo-oophorectomy, right  . Epigastric pain    Chronic, thought 2/2 gastritis, EGD (07/14/2011) showing chronic duodenitis, H.pyloir neg // Repeat EGD (09/2011) - esophagealring, sessible polyp in stomach body, hiatal hernia - rec ad probiotic, await bx  . Esophageal ring    s/p dilatation during EGD (07/2011) - Dr. Oneida Alar  . Fatty liver   . Gastritis    Thought due to chronic NSAID use 2/2 pain from her congenital hip abnormality  . Headache(784.0)    Migraines  . Hidradenitis suppurativa 05/2006  . Hip  deformity, congenital    Protrusio acetabuli with articular sclerosis and flattening of femoral heads. With chronic OA of hip  . History of migraine headaches    Typical symptoms - bright lights, unilateral (starting behind her left ear), throbbing .  Marland Kitchen Hot flashes   . Hypertension   . Internal hemorrhoids   . Osteoarthritis of hip   . Ovarian cancer ([place]) 1995  . Protrusio acetabuli    diagnosed at age 28  . Sessile colonic polyp    noted 09/2011 -- > rec colonoscopy in 10 years, 2023  . Surgical menopause 08/2007   On HRT. Occuring since 01/09 following TAH and R-salpingo-oophorectomy   . Tibial fracture 10/2008   Left - Sustained 2/2 fall down stairs. Patient is S/P closed internal fixation with Smith Nephew tibial nail locked proximal and distal  . Vitamin D deficiency 11/2009   Vit D level 15 in 11/2009    Past Surgical History:  Procedure Laterality Date  . ABDOMINAL HYSTERECTOMY    . CESAREAN SECTION  08/2005   Primary low transverse  . COLONOSCOPY  09/22/2011   [id number] polyp in the rectum/Internal hemorrhoids, benign path  . ESOPHAGOGASTRODUODENOSCOPY  09/22/2011   [id number], esophageal/Sessile polyp in the body of the stomach/Hiatal hernia/ABDOMINAL PAIN LIKELY FUNCTIONAL V. POST-INFECTIOUS IBS, path: mild chronic gastritis  .  FRACTURE SURGERY     Right hip  . JOINT REPLACEMENT     right hip  . LAPAROSCOPY  10/2000   Operative laparoscopy with lysis of right adnexal adhesions and uterointestinal adhesions  . left tib fib    . OTHER SURGICAL HISTORY  02/2009   Closed treatment internal fixation, left tibia with Tamala Julian and Nephew tibial nail locked proximal and distal  . TOTAL ABDOMINAL HYSTERECTOMY W/ BILATERAL SALPINGOOPHORECTOMY  08/2007   for endometriosis. With concurrent right salpingo-oophorectomy  (2009), left oophorectomy (1995)  . TUBAL LIGATION  08/2005   Right tubal ligation    There were no vitals filed for this visit.  Subjective Assessment -  11/17/19 0910    Subjective  Patient says she is doing better, neck pain is about a 4 today.    Pertinent History  Bilateral THA    Limitations  Sitting;Lifting;Standing;Walking;House hold activities    Currently in Pain?  Yes    Pain Score  4     Pain Location  Neck    Pain Orientation  Left    Pain Descriptors / Indicators  Sharp    Pain Type  Acute pain                       [facility] Adult PT Treatment/Exercise - 11/17/19 0001      Neck Exercises: Standing   Other Standing Exercises  wall pushups 2 x10    Other Standing Exercises  shoulder rows and extensions with GTB x15 each       Neck Exercises: Seated   Cervical Isometrics  Right lateral flexion;Left lateral flexion;5 reps;Right rotation;Left rotation;5 secs    Neck Retraction  5 reps;5 secs    Other Seated Exercise  pulley flexion 10x5" each     Other Seated Exercise  cervical excursions 3D 5 reps each      Manual Therapy   Manual Therapy  Soft tissue mobilization    Manual therapy comments  Manual complete separate than rest of tx    Soft tissue mobilization  IASTM using thergun to bilateral trapezius, levator and periscapular muscles                PT Short Term Goals - 11/16/19 1039      PT SHORT TERM GOAL #1   Title  Patient will be independant with initial HEP to improve functional outcomes    Time  3    Period  Weeks    Status  Achieved    Target Date  09/29/19      PT SHORT TERM GOAL #2   Title  Patient will report less than 5/10 pain globally, on average, at rest, to improve daily function    Baseline  Current: 4/10 average pain    Time  3    Period  Weeks    Status  Achieved    Target Date  09/29/19        PT Long Term Goals - 11/16/19 1040      PT LONG TERM GOAL #1   Title  Patient will report at least 50% improvement in cervical symptoms for improved quality of life.    Baseline  Current: 90% neck, 95% back improvement    Time  6    Period  Weeks    Status  Achieved       PT LONG TERM GOAL #2   Title  Patient will improve cervical spine ROM in all planes by 50%  in order to be able to drive safely.    Baseline  See ROM    Time  6    Period  Weeks    Status  Achieved      PT LONG TERM GOAL #3   Title  Patient will experience neck/headache pain less than 3/10 in order to improve sleep quality.    Baseline  Current: 4/10 pain but down to 1 x headache per week at 7/10    Time  6    Period  Weeks    Status  On-going            Plan - 11/17/19 1019    Clinical Impression Statement  Patient tolerated session well overall today. She continues to be limited my restriction in cervical and scapular muscular, but was able to progress postural strengthening exercise today. Patient also shows improvement in cervical ROM. Added wall push ups for periscapular and postural strengthening, patient required verbal cueing and demo for proper form and mechanics. Patient noted decreased tension in cervical region post manual treatment.    Personal Factors and Comorbidities  Comorbidity 2;Past/Current Experience;Time since onset of injury/illness/exacerbation    Comorbidities  Globalized pain in multiple sites    Examination-Activity Limitations  Bed Mobility;Sleep;Bend;Squat;Bathing;Sit;Carry;Stand;Dressing;Transfers;Lift;Locomotion Level;Stairs;Reach Overhead    Examination-Participation Restrictions  Cleaning;Community Activity;Yard Work;Laundry    Rehab Potential  Fair    PT Frequency  2x / week    PT Duration  3 weeks    PT Treatment/Interventions  ADLs/Self Care Home Management;DME Instruction;Biofeedback;Gait training;Stair training;Cryotherapy;Electrical Stimulation;Functional mobility training;Therapeutic activities;Moist Heat;Therapeutic exercise;Balance training;Manual techniques;Taping;Wheelchair mobility training;Orthotic Fit/Training;Patient/family education;Energy conservation;Joint Manipulations;Spinal Manipulations;Passive range of motion;Neuromuscular  re-education;Aquatic Therapy;Traction;Ultrasound;Splinting;Vestibular    PT Next Visit Plan  Continue to progress cervical and spine mobility as well as postural strengthening as tolerated by pain. continue postural strengthening with theraband. manual therapy as needed for muscular tension in UT, periscapulars.    PT Home Exercise Plan  2/3 deep breathing and body scan  10/03/19: LAQ 11/09/19 UT stretch 11/16/19 self stm with cane    Consulted and Agree with Plan of Care  Patient       Patient will benefit from skilled therapeutic intervention in order to improve the following deficits and impairments:  Abnormal gait, Decreased endurance, Decreased strength, Decreased activity tolerance, Decreased balance, Decreased mobility, Difficulty walking, Pain, Decreased range of motion, Impaired flexibility, Improper body mechanics, Postural dysfunction, Dizziness, Increased fascial restricitons, Increased muscle spasms, Impaired perceived functional ability  Visit Diagnosis: Acute left-sided low back pain without sciatica  Muscle weakness (generalized)  Other abnormalities of gait and mobility  Cervicalgia  Pain in right hip     Problem List Patient Active Problem List   Diagnosis Date Noted  . Concussion with no loss of consciousness 08/12/2018  . Acute pain of left shoulder 08/12/2018  . Low back pain 08/12/2018  . Allergic rhinitis 08/05/2018  . Arthritis 07/01/2018  . DDD (degenerative disc disease), lumbar 05/21/2018  . Thoracic arthritis 05/21/2018  . DDD (degenerative disc disease), cervical 01/06/2018  . Hidradenitis suppurativa 10/11/2017  . Chronic frontal sinusitis 10/11/2017  . Fatty liver 10/11/2017  . Chronic ethmoidal sinusitis 09/13/2017  . Photophobia of both eyes 09/13/2017  . Abnormal liver enzymes 09/13/2017  . History of hysterectomy with bilateral oophorectomy 06/24/2017  . Estrogen deficiency 06/24/2017  . Overweight (BMI 25.0-29.9) 11/17/2016  . Abdominal pain  11/04/2016  . Headache 12/17/2015  . Chronic constipation 10/12/2012  . Hot flashes 02/20/2012  . Hiatal hernia 02/20/2012  .  Chronic pain of both hips 02/20/2012  . Fatigue 12/19/2011  . Acetabulum intrapelvic protrusion into pelvic region or thigh 05/15/2011  . Annual physical exam 10/03/2010  . Hypertension 03/20/2010  . Vitamin D deficiency 08/20/2009  . Anxiety 05/18/2009  . Migraine headache 05/13/2006   10:26 AM, 11/17/19 Josue Hector PT DPT  Physical Therapist with [facility]  [phone] [place] [address] [place], [place], [address] Phone: [phone]   Fax:  [phone]  Name: [name] MRN: [id number] Date of Birth: 04/18/1976

## 2019-11-21 ENCOUNTER — Encounter (HOSPITAL_COMMUNITY): Payer: Self-pay | Admitting: Physical Therapy

## 2019-11-21 ENCOUNTER — Ambulatory Visit (HOSPITAL_COMMUNITY): Payer: BC Managed Care – PPO | Admitting: Physical Therapy

## 2019-11-21 ENCOUNTER — Other Ambulatory Visit: Payer: Self-pay | Admitting: Internal Medicine

## 2019-11-21 ENCOUNTER — Other Ambulatory Visit: Payer: Self-pay

## 2019-11-21 DIAGNOSIS — M7918 Myalgia, other site: Secondary | ICD-10-CM

## 2019-11-21 DIAGNOSIS — M6281 Muscle weakness (generalized): Secondary | ICD-10-CM | POA: Diagnosis not present

## 2019-11-21 DIAGNOSIS — M25551 Pain in right hip: Secondary | ICD-10-CM

## 2019-11-21 DIAGNOSIS — M545 Low back pain, unspecified: Secondary | ICD-10-CM

## 2019-11-21 DIAGNOSIS — M542 Cervicalgia: Secondary | ICD-10-CM

## 2019-11-21 DIAGNOSIS — R2689 Other abnormalities of gait and mobility: Secondary | ICD-10-CM | POA: Diagnosis not present

## 2019-11-21 NOTE — Telephone Encounter (Signed)
Referral placed.

## 2019-11-21 NOTE — Therapy (Signed)
[facility] [address] [place], [place], [address] Phone: [phone]   Fax:  [phone]  Physical Therapy Treatment  Patient Details  Name: [name] MRN: [id number] Date of Birth: [birth year] Referring Provider (PT): McLean-Scocuzza, Olivia Mackie; Melvenia Beam, MD   Encounter Date: 11/21/2019  PT End of Session - 11/21/19 1649    Visit Number  17    Number of Visits  25    Date for PT Re-Evaluation  12/07/19   PN completed 10/17/19, 11/16/19   Authorization Type  Primary: Medicare Secondary: BCBS Tertiary: Medicaid of [place]    Authorization Time Period  09/08/19- 10/20/19; 10/17/19-11/18/19, 11/16/19-12/07/19    Progress Note Due on Visit  21    PT Start Time  1647    PT Stop Time  1732    PT Time Calculation (min)  45 min    Activity Tolerance  Patient tolerated treatment well    Behavior During Therapy  [facility] for tasks assessed/performed       Past Medical History:  Diagnosis Date  . Abdominal pain, epigastric 12/17/2015  . Anxiety    pt denies having dx of anxiety  . BCC (basal cell carcinoma of skin)    right foot   . Bunion, right foot   . Chronic cough   . Closed fibular fracture 10/2008   Left - Sustained 2/2 fall down stairs (same fall as tibular fracture). S/P internal fixation.  . Duodenitis    EGD (07/14/2011) showing chronic duodenitis, H.pyloir neg  . Elevated liver enzymes   . Endometriosis    S/P TAH and salpingo-oophorectomy, right  . Epigastric pain    Chronic, thought 2/2 gastritis, EGD (07/14/2011) showing chronic duodenitis, H.pyloir neg // Repeat EGD (09/2011) - esophagealring, sessible polyp in stomach body, hiatal hernia - rec ad probiotic, await bx  . Esophageal ring    s/p dilatation during EGD (07/2011) - Dr. Oneida Alar  . Fatty liver   . Gastritis    Thought due to chronic NSAID use 2/2 pain from her congenital hip abnormality  . Headache(784.0)    Migraines  . Hidradenitis suppurativa 05/2006  . Hip  deformity, congenital    Protrusio acetabuli with articular sclerosis and flattening of femoral heads. With chronic OA of hip  . History of migraine headaches    Typical symptoms - bright lights, unilateral (starting behind her left ear), throbbing .  Marland Kitchen Hot flashes   . Hypertension   . Internal hemorrhoids   . Osteoarthritis of hip   . Ovarian cancer ([place]) 1995  . Protrusio acetabuli    diagnosed at age 69  . Sessile colonic polyp    noted 09/2011 -- > rec colonoscopy in 10 years, 2023  . Surgical menopause 08/2007   On HRT. Occuring since 01/09 following TAH and R-salpingo-oophorectomy   . Tibial fracture 10/2008   Left - Sustained 2/2 fall down stairs. Patient is S/P closed internal fixation with Smith Nephew tibial nail locked proximal and distal  . Vitamin D deficiency 11/2009   Vit D level 15 in 11/2009    Past Surgical History:  Procedure Laterality Date  . ABDOMINAL HYSTERECTOMY    . CESAREAN SECTION  08/2005   Primary low transverse  . COLONOSCOPY  09/22/2011   [id number] polyp in the rectum/Internal hemorrhoids, benign path  . ESOPHAGOGASTRODUODENOSCOPY  09/22/2011   [id number], esophageal/Sessile polyp in the body of the stomach/Hiatal hernia/ABDOMINAL PAIN LIKELY FUNCTIONAL V. POST-INFECTIOUS IBS, path: mild chronic gastritis  .  FRACTURE SURGERY     Right hip  . JOINT REPLACEMENT     right hip  . LAPAROSCOPY  10/2000   Operative laparoscopy with lysis of right adnexal adhesions and uterointestinal adhesions  . left tib fib    . OTHER SURGICAL HISTORY  02/2009   Closed treatment internal fixation, left tibia with Tamala Julian and Nephew tibial nail locked proximal and distal  . TOTAL ABDOMINAL HYSTERECTOMY W/ BILATERAL SALPINGOOPHORECTOMY  08/2007   for endometriosis. With concurrent right salpingo-oophorectomy  (2009), left oophorectomy (1995)  . TUBAL LIGATION  08/2005   Right tubal ligation    There were no vitals filed for this visit.  Subjective Assessment -  11/21/19 1650    Subjective  Patient says she was a little sore after last visit, but feels that her neck muscles are loosening up. Says she's not too bad today, neck is about a 4.    Pertinent History  Bilateral THA    Limitations  Sitting;Lifting;Standing;Walking;House hold activities    Currently in Pain?  Yes    Pain Score  4     Pain Location  Neck    Pain Orientation  Left    Pain Descriptors / Indicators  Sharp    Pain Type  Acute pain                       [facility] Adult PT Treatment/Exercise - 11/21/19 0001      Neck Exercises: Standing   Other Standing Exercises  wall pushups x10, shoulder horixontal abduction matrix with RTB x 5 each     Other Standing Exercises  shoulder rows and extensions with GTB x20 each       Neck Exercises: Seated   Cervical Isometrics  Right lateral flexion;Left lateral flexion;5 reps;Right rotation;Left rotation;5 secs    Neck Retraction  5 reps;5 secs    Other Seated Exercise  pulley flexion 10x5" each     Other Seated Exercise  cervical excursions 3D 5 reps each      Manual Therapy   Manual Therapy  Soft tissue mobilization    Manual therapy comments  Manual complete separate than rest of tx    Soft tissue mobilization  IASTM using thergun to bilateral trapezius, levator and periscapular muscles                PT Short Term Goals - 11/16/19 1039      PT SHORT TERM GOAL #1   Title  Patient will be independant with initial HEP to improve functional outcomes    Time  3    Period  Weeks    Status  Achieved    Target Date  09/29/19      PT SHORT TERM GOAL #2   Title  Patient will report less than 5/10 pain globally, on average, at rest, to improve daily function    Baseline  Current: 4/10 average pain    Time  3    Period  Weeks    Status  Achieved    Target Date  09/29/19        PT Long Term Goals - 11/16/19 1040      PT LONG TERM GOAL #1   Title  Patient will report at least 50% improvement in cervical  symptoms for improved quality of life.    Baseline  Current: 90% neck, 95% back improvement    Time  6    Period  Weeks    Status  Achieved      PT LONG TERM GOAL #2   Title  Patient will improve cervical spine ROM in all planes by 50% in order to be able to drive safely.    Baseline  See ROM    Time  6    Period  Weeks    Status  Achieved      PT LONG TERM GOAL #3   Title  Patient will experience neck/headache pain less than 3/10 in order to improve sleep quality.    Baseline  Current: 4/10 pain but down to 1 x headache per week at 7/10    Time  6    Period  Weeks    Status  On-going            Plan - 11/21/19 1739    Clinical Impression Statement  Patient tolerated session well overall today. Patient was challenged with added horizontal abduction matrix with RTB and required frequent verbal cues and demo for proper form. Patient noted increased muscle fatigue with added activity. Patient continues to be limited by postural deficits and weakness, but has been making slow, steady progress. Patient reports decreased muscle restriction, notably with LT cervical rotation post maul treatment.    Personal Factors and Comorbidities  Comorbidity 2;Past/Current Experience;Time since onset of injury/illness/exacerbation    Comorbidities  Globalized pain in multiple sites    Examination-Activity Limitations  Bed Mobility;Sleep;Bend;Squat;Bathing;Sit;Carry;Stand;Dressing;Transfers;Lift;Locomotion Level;Stairs;Reach Overhead    Examination-Participation Restrictions  Cleaning;Community Activity;Yard Work;Laundry    Rehab Potential  Fair    PT Frequency  2x / week    PT Duration  3 weeks    PT Treatment/Interventions  ADLs/Self Care Home Management;DME Instruction;Biofeedback;Gait training;Stair training;Cryotherapy;Electrical Stimulation;Functional mobility training;Therapeutic activities;Moist Heat;Therapeutic exercise;Balance training;Manual techniques;Taping;Wheelchair mobility  training;Orthotic Fit/Training;Patient/family education;Energy conservation;Joint Manipulations;Spinal Manipulations;Passive range of motion;Neuromuscular re-education;Aquatic Therapy;Traction;Ultrasound;Splinting;Vestibular    PT Next Visit Plan  Continue to progress cervical and spine mobility as well as postural strengthening as tolerated by pain. continue postural strengthening with theraband. manual therapy as needed for muscular tension in UT, periscapulars. Add shoulder flexion, abduciton rasie next visit    PT Home Exercise Plan  2/3 deep breathing and body scan  10/03/19: LAQ 11/09/19 UT stretch 11/16/19 self stm with cane    Consulted and Agree with Plan of Care  Patient       Patient will benefit from skilled therapeutic intervention in order to improve the following deficits and impairments:  Abnormal gait, Decreased endurance, Decreased strength, Decreased activity tolerance, Decreased balance, Decreased mobility, Difficulty walking, Pain, Decreased range of motion, Impaired flexibility, Improper body mechanics, Postural dysfunction, Dizziness, Increased fascial restricitons, Increased muscle spasms, Impaired perceived functional ability  Visit Diagnosis: Acute left-sided low back pain without sciatica  Muscle weakness (generalized)  Other abnormalities of gait and mobility  Cervicalgia  Pain in right hip     Problem List Patient Active Problem List   Diagnosis Date Noted  . Concussion with no loss of consciousness 08/12/2018  . Acute pain of left shoulder 08/12/2018  . Low back pain 08/12/2018  . Allergic rhinitis 08/05/2018  . Arthritis 07/01/2018  . DDD (degenerative disc disease), lumbar 05/21/2018  . Thoracic arthritis 05/21/2018  . DDD (degenerative disc disease), cervical 01/06/2018  . Hidradenitis suppurativa 10/11/2017  . Chronic frontal sinusitis 10/11/2017  . Fatty liver 10/11/2017  . Chronic ethmoidal sinusitis 09/13/2017  . Photophobia of both eyes  09/13/2017  . Abnormal liver enzymes 09/13/2017  . History of hysterectomy with bilateral oophorectomy 06/24/2017  . Estrogen deficiency  06/24/2017  . Overweight (BMI 25.0-29.9) 11/17/2016  . Abdominal pain 11/04/2016  . Headache 12/17/2015  . Chronic constipation 10/12/2012  . Hot flashes 02/20/2012  . Hiatal hernia 02/20/2012  . Chronic pain of both hips 02/20/2012  . Fatigue 12/19/2011  . Acetabulum intrapelvic protrusion into pelvic region or thigh 05/15/2011  . Annual physical exam 10/03/2010  . Hypertension 03/20/2010  . Vitamin D deficiency 08/20/2009  . Anxiety 05/18/2009  . Migraine headache 05/13/2006   5:42 PM, 11/21/19 Josue Hector PT DPT  Physical Therapist with [facility]  [phone]   [facility] [address] [place], [place], [address] Phone: [phone]   Fax:  [phone]  Name: YOLINDA KENDER MRN: [id number] Date of Birth: 07/08/1976

## 2019-11-22 DIAGNOSIS — F331 Major depressive disorder, recurrent, moderate: Secondary | ICD-10-CM | POA: Diagnosis not present

## 2019-11-23 ENCOUNTER — Encounter: Payer: Self-pay | Admitting: Emergency Medicine

## 2019-11-23 ENCOUNTER — Ambulatory Visit
Admission: EM | Admit: 2019-11-23 | Discharge: 2019-11-23 | Disposition: A | Discharge status: Home / Self Care | Payer: Medicare Other | Attending: Family Medicine | Admitting: Family Medicine

## 2019-11-23 ENCOUNTER — Ambulatory Visit (HOSPITAL_COMMUNITY): Payer: BC Managed Care – PPO | Admitting: Physical Therapy

## 2019-11-23 ENCOUNTER — Other Ambulatory Visit: Payer: Self-pay

## 2019-11-23 DIAGNOSIS — M436 Torticollis: Secondary | ICD-10-CM | POA: Diagnosis not present

## 2019-11-23 DIAGNOSIS — M62838 Other muscle spasm: Secondary | ICD-10-CM | POA: Diagnosis not present

## 2019-11-23 MED ORDER — METAXALONE 800 MG PO TABS: 800.0000 mg | ORAL_TABLET | Freq: Three times a day (TID) | ORAL | 0 refills | Status: DC | PRN
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
## 2019-11-23 NOTE — ED Triage Notes (Signed)
Patient c/o neck pain on the left side of her neck that started last night.

## 2019-11-23 NOTE — Discharge Instructions (Signed)
Heat.  Massage.  Medication as directed.  Take care  Dr. Lacinda Axon

## 2019-11-24 ENCOUNTER — Ambulatory Visit (HOSPITAL_COMMUNITY): Payer: BC Managed Care – PPO | Admitting: Physical Therapy

## 2019-11-24 ENCOUNTER — Encounter (HOSPITAL_COMMUNITY): Payer: Self-pay | Admitting: Physical Therapy

## 2019-11-24 DIAGNOSIS — R2689 Other abnormalities of gait and mobility: Secondary | ICD-10-CM

## 2019-11-24 DIAGNOSIS — M25551 Pain in right hip: Secondary | ICD-10-CM

## 2019-11-24 DIAGNOSIS — M542 Cervicalgia: Secondary | ICD-10-CM

## 2019-11-24 DIAGNOSIS — M545 Low back pain, unspecified: Secondary | ICD-10-CM

## 2019-11-24 DIAGNOSIS — M6281 Muscle weakness (generalized): Secondary | ICD-10-CM | POA: Diagnosis not present
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
## 2019-11-26 ENCOUNTER — Encounter (HOSPITAL_COMMUNITY): Payer: Self-pay | Admitting: Emergency Medicine

## 2019-11-26 ENCOUNTER — Emergency Department (HOSPITAL_COMMUNITY): Payer: BC Managed Care – PPO

## 2019-11-26 ENCOUNTER — Emergency Department (HOSPITAL_COMMUNITY)
Admission: EM | Admit: 2019-11-26 | Discharge: 2019-11-27 | Disposition: A | Discharge status: Home / Self Care | Payer: BC Managed Care – PPO | Attending: Emergency Medicine | Admitting: Emergency Medicine

## 2019-11-26 ENCOUNTER — Other Ambulatory Visit: Payer: Self-pay

## 2019-11-26 DIAGNOSIS — Z91040 Latex allergy status: Secondary | ICD-10-CM | POA: Diagnosis not present

## 2019-11-26 DIAGNOSIS — R6884 Jaw pain: Secondary | ICD-10-CM | POA: Diagnosis not present

## 2019-11-26 DIAGNOSIS — I10 Essential (primary) hypertension: Secondary | ICD-10-CM | POA: Insufficient documentation

## 2019-11-26 DIAGNOSIS — K029 Dental caries, unspecified: Secondary | ICD-10-CM | POA: Diagnosis not present

## 2019-11-26 DIAGNOSIS — Z8543 Personal history of malignant neoplasm of ovary: Secondary | ICD-10-CM | POA: Insufficient documentation

## 2019-11-26 DIAGNOSIS — Z96641 Presence of right artificial hip joint: Secondary | ICD-10-CM | POA: Insufficient documentation

## 2019-11-26 DIAGNOSIS — Z79899 Other long term (current) drug therapy: Secondary | ICD-10-CM | POA: Diagnosis not present

## 2019-11-26 LAB — CBC WITH DIFFERENTIAL/PLATELET
Abs Immature Granulocytes: 0.03 10*3/uL (ref 0.00–0.07)
Basophils Absolute: 0.1 10*3/uL (ref 0.0–0.1)
Basophils Relative: 1 %
Eosinophils Absolute: 0.1 10*3/uL (ref 0.0–0.5)
Eosinophils Relative: 1 %
HCT: 40.3 % (ref 36.0–46.0)
Hemoglobin: 13 g/dL (ref 12.0–15.0)
Immature Granulocytes: 0 %
Lymphocytes Relative: 24 %
Lymphs Abs: 2.3 10*3/uL (ref 0.7–4.0)
MCH: 27 pg (ref 26.0–34.0)
MCHC: 32.3 g/dL (ref 30.0–36.0)
MCV: 83.6 fL (ref 80.0–100.0)
Monocytes Absolute: 1 10*3/uL (ref 0.1–1.0)
Monocytes Relative: 11 %
Neutro Abs: 5.9 10*3/uL (ref 1.7–7.7)
Neutrophils Relative %: 63 %
Platelets: 354 10*3/uL (ref 150–400)
RBC: 4.82 MIL/uL (ref 3.87–5.11)
RDW: 13.8 % (ref 11.5–15.5)
WBC: 9.4 10*3/uL (ref 4.0–10.5)
nRBC: 0 % (ref 0.0–0.2)

## 2019-11-26 LAB — BASIC METABOLIC PANEL
Anion gap: 9 (ref 5–15)
BUN: 10 mg/dL (ref 6–20)
CO2: 26 mmol/L (ref 22–32)
Calcium: 9.3 mg/dL (ref 8.9–10.3)
Chloride: 105 mmol/L (ref 98–111)
Creatinine, Ser: 0.81 mg/dL (ref 0.44–1.00)
GFR calc Af Amer: >60 mL/min (ref >60)
GFR calc non Af Amer: >60 mL/min (ref >60)
Glucose, Bld: 109 mg/dL — ABNORMAL HIGH (ref 70–99)
Potassium: 3.1 mmol/L — ABNORMAL LOW (ref 3.5–5.1)
Sodium: 140 mmol/L (ref 135–145)

## 2019-11-26 MED ORDER — DEXAMETHASONE SODIUM PHOSPHATE 10 MG/ML IJ SOLN
10.0000 mg | Freq: Once | INTRAMUSCULAR | Status: AC
  Administered 2019-11-27: 10 mg via INTRAVENOUS
  Filled 2019-11-26: qty 1

## 2019-11-26 MED ORDER — SODIUM CHLORIDE 0.9 % IV BOLUS: 1000.0000 mL | Freq: Once | INTRAVENOUS | Status: DC

## 2019-11-26 MED ORDER — LORAZEPAM 2 MG/ML IJ SOLN
1.0000 mg | Freq: Once | INTRAMUSCULAR | Status: AC
  Administered 2019-11-26: 1 mg via INTRAVENOUS
  Filled 2019-11-26: qty 1

## 2019-11-26 MED ORDER — KETOROLAC TROMETHAMINE 30 MG/ML IJ SOLN
30.0000 mg | Freq: Once | INTRAMUSCULAR | Status: AC
  Administered 2019-11-27: 30 mg via INTRAVENOUS
  Filled 2019-11-26: qty 1

## 2019-11-26 MED ORDER — FENTANYL CITRATE (PF) 100 MCG/2ML IJ SOLN
50.0000 ug | Freq: Once | INTRAMUSCULAR | Status: AC
  Administered 2019-11-26: 50 ug via INTRAVENOUS
  Filled 2019-11-26: qty 2

## 2019-11-26 MED ORDER — IOHEXOL 300 MG/ML  SOLN
75.0000 mL | Freq: Once | INTRAMUSCULAR | Status: AC | PRN
  Administered 2019-11-26: 75 mL via INTRAVENOUS

## 2019-11-26 MED ORDER — SODIUM CHLORIDE 0.9 % IV BOLUS
1000.0000 mL | Freq: Once | INTRAVENOUS | Status: AC
  Administered 2019-11-26: 1000 mL via INTRAVENOUS

## 2019-11-26 NOTE — ED Triage Notes (Signed)
Pt in reporting R jaw pain/dental pain. Wisdom teeth removed 2 weeks ago. Pt refuses to talk in triage and refuses to open her mouth to let me assess.

## 2019-11-26 NOTE — ED Provider Notes (Signed)
[name] Provider Note   CSN: [id number] Arrival date & time: 11/26/19  1916     History Chief Complaint  Patient presents with  . Dental Pain    [name] is a 44 y.o. female.  [name] is a 44 y.o. female with a medical history as listed below, who recently had her wisdom teeth removed, and presents to the emergency [name] reporting right jaw pain.  Since onset of pain patient has been unable to open her mouth or talk.  She provides the majority of history by typing on her phone.  She states that around 6 PM she was having dinner with her family when she heard a pop in her jaw and since then has not been able to open it and is unable to talk.  She states that it is uncomfortable when she swallows, but she is able to breathe normally.  She states that she has been having waxing and waning pain since her surgery, she is not currently on any antibiotics.  Has not had any fevers or chills.  She has noticed some swelling over her right jaw.  Has not had any bleeding.  She states she is not had issues like this previously where she cannot open her jaw.  She does state that she was seen 2 days ago at [facility] urgent care for left-sided neck pain and diagnosed with torticollis, discharged on muscle relaxers and she states she still having some discomfort here but it is improving.  Her husband is at bedside who provides supplemental history, and states that she was doing well today until they heard the loud pop at dinner and then she was unable to open her mouth.  She has not taken any medications since then.  Additional history obtained from chart review which shows that patient has also previously been seen for TMJ treatment, and treatment of cervicalgia.        Past Medical History:  Diagnosis Date  . Abdominal pain, epigastric 12/17/2015  . Anxiety    pt denies having dx of anxiety  . BCC (basal cell carcinoma of skin)    right  foot   . Bunion, right foot   . Chronic cough   . Closed fibular fracture 10/2008   Left - Sustained 2/2 fall down stairs (same fall as tibular fracture). S/P internal fixation.  . Duodenitis    EGD (07/14/2011) showing chronic duodenitis, H.pyloir neg  . Elevated liver enzymes   . Endometriosis    S/P TAH and salpingo-oophorectomy, right  . Epigastric pain    Chronic, thought 2/2 gastritis, EGD (07/14/2011) showing chronic duodenitis, H.pyloir neg // Repeat EGD (09/2011) - esophagealring, sessible polyp in stomach body, hiatal hernia - rec ad probiotic, await bx  . Esophageal ring    s/p dilatation during EGD (07/2011) - Dr. Oneida Alar  . Fatty liver   . Gastritis    Thought due to chronic NSAID use 2/2 pain from her congenital hip abnormality  . Headache(784.0)    Migraines  . Hidradenitis suppurativa 05/2006  . 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 With chronic OA of hip  . History of migraine headaches    Typical symptoms - bright lights, unilateral (starting behind her left ear), throbbing .  Marland Kitchen Hot flashes   . Hypertension   . Internal hemorrhoids   . Osteoarthritis of hip   . Ovarian cancer ([place]) 1995  .  Protrusio acetabuli    diagnosed at age 88  . Sessile colonic polyp    noted 09/2011 -- > rec colonoscopy in 10 years, 2023  . Surgical menopause 08/2007   On HRT. Occuring since 01/09 following TAH and R-salpingo-oophorectomy   . Tibial fracture 10/2008   Left - Sustained 2/2 fall down stairs. Patient is S/P closed internal fixation with Smith Nephew tibial nail locked proximal and distal  . Vitamin D deficiency 11/2009   Vit D level 15 in 11/2009    Patient Active Problem List   Diagnosis Date Noted  . Concussion with no loss of consciousness 08/12/2018  . Acute pain of left shoulder 08/12/2018  . Low back pain 08/12/2018  . Allergic rhinitis 08/05/2018  . Arthritis 07/01/2018  . DDD (degenerative  disc disease), lumbar 05/21/2018  . Thoracic arthritis 05/21/2018  . DDD (degenerative disc disease), cervical 01/06/2018  . Hidradenitis suppurativa 10/11/2017  . Chronic frontal sinusitis 10/11/2017  . Fatty liver 10/11/2017  . Chronic ethmoidal sinusitis 09/13/2017  . Photophobia of both eyes 09/13/2017  . Abnormal liver enzymes 09/13/2017  . History of hysterectomy with bilateral oophorectomy 06/24/2017  . Estrogen deficiency 06/24/2017  . Overweight (BMI 25.0-29.9) 11/17/2016  . Abdominal pain 11/04/2016  . Headache 12/17/2015  . Chronic constipation 10/12/2012  . Hot flashes 02/20/2012  . Hiatal hernia 02/20/2012  . Chronic pain of both hips 02/20/2012  . Fatigue 12/19/2011  . Acetabulum intrapelvic protrusion into pelvic region or thigh 05/15/2011  . Annual physical exam 10/03/2010  . Hypertension 03/20/2010  . Vitamin D deficiency 08/20/2009  . Anxiety 05/18/2009  . Migraine headache 05/13/2006    Past Surgical History:  Procedure Laterality Date  . ABDOMINAL HYSTERECTOMY    . CESAREAN SECTION  08/2005   Primary low transverse  . COLONOSCOPY  09/22/2011   [id number] polyp in the rectum/Internal hemorrhoids, benign path  . ESOPHAGOGASTRODUODENOSCOPY  09/22/2011   [id number], esophageal/Sessile polyp in the body of the stomach/Hiatal hernia/ABDOMINAL PAIN LIKELY FUNCTIONAL V. POST-INFECTIOUS IBS, path: mild chronic gastritis  . FRACTURE SURGERY     Right hip  . JOINT REPLACEMENT     right hip  . LAPAROSCOPY  10/2000   Operative laparoscopy with lysis of right adnexal adhesions and uterointestinal adhesions  . left tib fib    . OTHER SURGICAL HISTORY  02/2009   Closed treatment internal fixation, left tibia with Tamala Julian and Nephew tibial nail locked proximal and distal  . TOTAL ABDOMINAL HYSTERECTOMY W/ BILATERAL SALPINGOOPHORECTOMY  08/2007   for endometriosis. With concurrent right salpingo-oophorectomy  (2009), left oophorectomy (1995)  . TUBAL LIGATION  08/2005    Right tubal ligation     OB History   No obstetric history on file.     Family History  Problem Relation Age of Onset  . Other Father        deceased while in service.   . Breast cancer Sister 10       remission  . Cancer Sister        breast  . Lupus Cousin   . Dementia Maternal Grandmother        at old age  . Parkinson's disease Maternal Grandfather   . Colon cancer Neg Hx   . Liver disease Neg Hx   . Migraines Neg Hx     Social History   Tobacco Use  . Smoking status: Never Smoker  . Smokeless tobacco: Never Used  Substance Use Topics  . Alcohol use: No  Alcohol/week: 0.0 standard drinks  . Drug use: No    Home Medications Prior to Admission medications   Medication Sig Start Date End Date Taking? Authorizing Provider  amLODipine (NORVASC) 10 MG tablet Take 1 tablet (10 mg total) by mouth daily. 05/31/19   McLean-Scocuzza, Nino Glow, MD  metaxalone (SKELAXIN) 800 MG tablet Take 1 tablet (800 mg total) by mouth 3 (three) times daily as needed for muscle spasms. 11/23/19   Coral Spikes, DO  losartan (COZAAR) 25 MG tablet Take 1 tablet (25 mg total) by mouth daily. In am 05/18/19 09/14/19  McLean-Scocuzza, Nino Glow, MD    Allergies    Pineapple, Hctz [hydrochlorothiazide], Latex, Lisinopril, Morphine and related, and Tape  Review of Systems   Review of Systems  Constitutional: Negative for chills and fever.  HENT: Positive for dental problem, facial swelling and trouble swallowing.   Respiratory: Negative for cough and shortness of breath.   Cardiovascular: Negative for chest pain.  Gastrointestinal: Negative for nausea and vomiting.  Musculoskeletal: Negative for neck pain and neck stiffness.  Neurological: Negative for headaches.  All other systems reviewed and are negative.   Physical Exam Updated Vital Signs BP (!) 163/97 (BP Location: Left Arm)   Pulse 81   Temp 98.7 F (37.1 C) (Oral)   Resp 18   SpO2 100%   Physical Exam Vitals and nursing note  reviewed.  Constitutional:      General: She is not in acute distress.    Appearance: Normal appearance. She is well-developed. She is not ill-appearing or diaphoretic.  HENT:     Head: Normocephalic and atraumatic.     Nose: Nose normal.     Mouth/Throat:     Comments: Patient is unable to open her mouth at all and is unable to speak.  She has significant tenderness to even light palpation over the right jaw and especially over the right TMJ.  With tongue blade I was able to move the cheek away from the teeth and there does not appear to be malocclusion of the jaw I do not see any bleeding from recent wisdom tooth extraction sites.  Intraoral examination is extremely limited. Eyes:     General:        Right eye: No discharge.        Left eye: No discharge.  Neck:     Comments: Supple without stridor, the neck is nontender to palpation with normal range of motion Pulmonary:     Effort: Pulmonary effort is normal. No respiratory distress.  Musculoskeletal:        General: No deformity.     Cervical back: Neck supple.  Skin:    General: Skin is warm and dry.  Neurological:     Mental Status: She is alert and oriented to person, place, and time.     Coordination: Coordination normal.  Psychiatric:        Mood and Affect: Mood normal.        Behavior: Behavior normal.     ED Results / Procedures / Treatments   Labs (all labs ordered are listed, but only abnormal results are displayed) Labs Reviewed  BASIC METABOLIC PANEL - Abnormal; Notable for the following components:      Result Value   Potassium 3.1 (*)    Glucose, Bld 109 (*)    All other components within normal limits  CBC WITH DIFFERENTIAL/PLATELET  I-STAT BETA HCG BLOOD, ED ([facility], [facility], [facility] ONLY)  I-STAT CHEM 8, ED  EKG None  Radiology CT Maxillofacial W Contrast  Result Date: 11/26/2019 CLINICAL DATA:  Right mandible and dental pain. Wisdom teeth remain 2 weeks ago. Unable open jaw or talk. Symptoms occurred  suddenly after hearing a pop while eating. EXAM: CT MAXILLOFACIAL WITH CONTRAST TECHNIQUE: Multidetector CT imaging of the maxillofacial structures was performed with intravenous contrast. Multiplanar CT image reconstructions were also generated. CONTRAST:  61mL OMNIPAQUE IOHEXOL 300 MG/ML  SOLN COMPARISON:  CT of the maxillofacial 09/18/2017. FINDINGS: Osseous: Mandible is intact. Right first mandibular molar is intact. Soft tissue is present within the posterior right mandible where the second and third molars were extracted. No acute fractures are present. The root canals are empty in the left mandible. Prominent dental caries is present in the left maxillary second premolar. No adjacent soft tissue swelling is present along the mandible. Mandible is otherwise normal. Degenerative changes are present the upper cervical spine. Osseous structures of the face are otherwise unremarkable. Orbits: The globes and orbits are within normal limits. Sinuses: The paranasal sinuses and mastoid air cells are clear. Soft tissues: Soft tissues the face are otherwise unremarkable. Salivary glands are within normal limits. Limited intracranial: Within normal limits. IMPRESSION: 1. Prominent soft tissue in the posterior right mandible along extraction site. This area should be amenable to direct visualization. No significant inflammatory changes to suggest infection. 2. Status post resection of left mandibular molar. 3. Left maxillary tooth dental caries. Electronically Signed   By: San Morelle M.D.   On: 11/26/2019 22:46    Procedures Procedures (including critical care time)  Medications Ordered in ED Medications  ketorolac (TORADOL) 30 MG/ML injection 30 mg (has no administration in time range)  dexamethasone (DECADRON) injection 10 mg (has no administration in time range)  sodium chloride 0.9 % bolus 1,000 mL (has no administration in time range)  fentaNYL (SUBLIMAZE) injection 50 mcg (50 mcg Intravenous  Given 11/26/19 2137)  sodium chloride 0.9 % bolus 1,000 mL (0 mLs Intravenous Stopped 11/26/19 2314)  LORazepam (ATIVAN) injection 1 mg (1 mg Intravenous Given 11/26/19 2145)  iohexol (OMNIPAQUE) 300 MG/ML solution 75 mL (75 mLs Intravenous Contrast Given 11/26/19 2224)    ED Course  I have reviewed the triage vital signs and the nursing notes.  Pertinent labs & imaging results that were available during my care of the patient were reviewed by me and considered in my medical decision making (see chart for details).    MDM Rules/Calculators/A&P                     44 year old female presents to the ED with sudden onset of right jaw pain after she heard a pop while eating, now she is unable to open the jaw or talk.  She recently had her wisdom teeth removed 2 weeks ago with [facility].  She is not currently on any antibiotics has not had fevers or chills.  On exam she is very tender over the right jaw and TMJ, I am unable to open the jaw at all and intraoral examination is limited but I do not see any bleeding surrounding recent extraction sites and patient appears to be tolerating her secretions.  Will get basic labs and CT maxillofacial with contrast to evaluate for potential jaw fracture, dislocation or deep space infection.  Patient was also recently treated for torticollis so question if this could be spasm.  Will give IV fluids, fentanyl and Ativan.  Patient expresses concern about pain medication, she has had  palpitations in the past with hydrocodone, but after discussing that we will give a low-dose of fentanyl and this is a short acting medication she is agreeable.  I have ordered, reviewed and interpreted labs and imaging. CBC: No leukocytosis and normal hemoglobin. BMP: Mild hypokalemia of 3.1, no other electrolyte derangements and normal renal function  CT maxillofacial shows no evidence of dislocation or fracture of the mandible.  There is prominent soft tissue in the posterior  right mandible along the extraction site suggesting swelling but there is no abscess or fluid collection.  I discussed reassuring results and went into reassess patient.  She is still complaining of some discomfort over the right jaw and is still unable to open her mouth she is able to mumble a few words.  Case discussed with Dr. Langston Masker who has been in to see and evaluate the patient as well.  Suspect swelling and inflammation after surgery, as well as with history of TMJ issues.  Recommends IV Toradol, Decadron, liquid diet with straw, and discharge home with close follow-up with her oral surgeon on Monday morning.  Patient able to tolerate some water through a straw able to open her mouth very slightly.  No difficulty swallowing.  Will discharge home.  Patient's daughter is at bedside and I have given care instructions to her as well.  Return precautions discussed.  Patient discharged home in good condition.   Final Clinical Impression(s) / ED Diagnoses Final diagnoses:  Jaw pain    Rx / DC Orders ED Discharge Orders    None       Janet Berlin 11/27/19 [id number]    Wyvonnia Dusky, MD 11/27/19 [phone]

## 2019-11-27 DIAGNOSIS — R6884 Jaw pain: Secondary | ICD-10-CM | POA: Diagnosis not present

## 2019-11-27 LAB — I-STAT CHEM 8, ED
BUN: 8 mg/dL (ref 6–20)
Calcium, Ion: 1.08 mmol/L — ABNORMAL LOW (ref 1.15–1.40)
Chloride: 104 mmol/L (ref 98–111)
Creatinine, Ser: 0.7 mg/dL (ref 0.44–1.00)
Glucose, Bld: 106 mg/dL — ABNORMAL HIGH (ref 70–99)
HCT: 40 % (ref 36.0–46.0)
Hemoglobin: 13.6 g/dL (ref 12.0–15.0)
Potassium: 3.1 mmol/L — ABNORMAL LOW (ref 3.5–5.1)
Sodium: 144 mmol/L (ref 135–145)
TCO2: 28 mmol/L (ref 22–32)

## 2019-11-27 LAB — I-STAT BETA HCG BLOOD, ED (MC, WL, AP ONLY): I-stat hCG, quantitative: <5 m[IU]/mL (ref <5)

## 2019-11-27 NOTE — Discharge Instructions (Signed)
You were given anti-inflammatory medications and steroids today that should continue to help with the swelling in your jaw.  We did not see any evidence of a fracture or dislocation we did not see any abscess just some swelling in the soft tissue in this area.  I suspect that as these medicines continue to work he will be able to open your jaw more, but in the meantime focus on a liquid diet with things you can drink through a straw, you can also have things like smoothies.  First thing Monday morning please call to schedule a follow-up appointment with your oral surgeon so that they can evaluate this issue.

## 2019-11-28 ENCOUNTER — Telehealth (HOSPITAL_COMMUNITY): Payer: Self-pay | Admitting: Physical Therapy

## 2019-11-28 ENCOUNTER — Ambulatory Visit (HOSPITAL_COMMUNITY): Payer: BC Managed Care – PPO | Admitting: Physical Therapy

## 2019-11-28 NOTE — Telephone Encounter (Signed)
pt's son called to cancel his mom's appt due to she cannot talk due to she popped her jaw

## 2019-11-30 ENCOUNTER — Ambulatory Visit (HOSPITAL_COMMUNITY): Payer: BC Managed Care – PPO | Admitting: Physical Therapy

## 2019-11-30 ENCOUNTER — Encounter (HOSPITAL_COMMUNITY): Payer: Self-pay | Admitting: Physical Therapy

## 2019-11-30 ENCOUNTER — Other Ambulatory Visit: Payer: Self-pay

## 2019-11-30 DIAGNOSIS — M545 Low back pain, unspecified: Secondary | ICD-10-CM

## 2019-11-30 DIAGNOSIS — M25551 Pain in right hip: Secondary | ICD-10-CM | POA: Diagnosis not present

## 2019-11-30 DIAGNOSIS — M6281 Muscle weakness (generalized): Secondary | ICD-10-CM

## 2019-11-30 DIAGNOSIS — R2689 Other abnormalities of gait and mobility: Secondary | ICD-10-CM

## 2019-11-30 DIAGNOSIS — M542 Cervicalgia: Secondary | ICD-10-CM | POA: Diagnosis not present

## 2019-11-30 NOTE — Therapy (Signed)
[facility] [address] [place], [place], [address] Phone: [phone]   Fax:  [phone]  Physical Therapy Treatment  Patient Details  Name: [name] MRN: [id number] Date of Birth: 08-08-1976 Referring Provider (PT): McLean-Scocuzza, Olivia Mackie; Melvenia Beam, MD   Encounter Date: 11/30/2019  PT End of Session - 11/30/19 1130    Visit Number  19    Number of Visits  25    Date for PT Re-Evaluation  12/07/19    Authorization Type  Primary: Medicare Secondary: BCBS Tertiary: Medicaid of [place]    Progress Note Due on Visit  21    PT Start Time  1120    PT Stop Time  1200    PT Time Calculation (min)  40 min    Activity Tolerance  Patient tolerated treatment well    Behavior During Therapy  WFL for tasks assessed/performed       Past Medical History:  Diagnosis Date  . Abdominal pain, epigastric 12/17/2015  . Anxiety    pt denies having dx of anxiety  . BCC (basal cell carcinoma of skin)    right foot   . Bunion, right foot   . Chronic cough   . Closed fibular fracture 10/2008   Left - Sustained 2/2 fall down stairs (same fall as tibular fracture). S/P internal fixation.  . Duodenitis    EGD (07/14/2011) showing chronic duodenitis, H.pyloir neg  . Elevated liver enzymes   . Endometriosis    S/P TAH and salpingo-oophorectomy, right  . Epigastric pain    Chronic, thought 2/2 gastritis, EGD (07/14/2011) showing chronic duodenitis, H.pyloir neg // Repeat EGD (09/2011) - esophagealring, sessible polyp in stomach body, hiatal hernia - rec ad probiotic, await bx  . Esophageal ring    s/p dilatation during EGD (07/2011) - Dr. Oneida Alar  . Fatty liver   . Gastritis    Thought due to chronic NSAID use 2/2 pain from her congenital hip abnormality  . Headache(784.0)    Migraines  . Hidradenitis suppurativa 05/2006  . 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 With  chronic OA of hip  . History of migraine headaches    Typical symptoms - bright lights, unilateral (starting behind her left ear), throbbing .  Marland Kitchen Hot flashes   . Hypertension   . Internal hemorrhoids   . Osteoarthritis of hip   . Ovarian cancer ([place]) 1995  . Protrusio acetabuli    diagnosed at age 69  . Sessile colonic polyp    noted 09/2011 -- > rec colonoscopy in 10 years, 2023  . Surgical menopause 08/2007   On HRT. Occuring since 01/09 following TAH and R-salpingo-oophorectomy   . Tibial fracture 10/2008   Left - Sustained 2/2 fall down stairs. Patient is S/P closed internal fixation with Smith Nephew tibial nail locked proximal and distal  . Vitamin D deficiency 11/2009   Vit D level 15 in 11/2009    Past Surgical History:  Procedure Laterality Date  . ABDOMINAL HYSTERECTOMY    . CESAREAN SECTION  08/2005   Primary low transverse  . COLONOSCOPY  09/22/2011   [id number] polyp in the rectum/Internal hemorrhoids, benign path  . ESOPHAGOGASTRODUODENOSCOPY  09/22/2011   [id number], esophageal/Sessile polyp in the body of the stomach/Hiatal hernia/ABDOMINAL PAIN LIKELY FUNCTIONAL V. POST-INFECTIOUS IBS, path: mild chronic gastritis  . FRACTURE SURGERY     Right hip  . JOINT REPLACEMENT  right hip  . LAPAROSCOPY  10/2000   Operative laparoscopy with lysis of right adnexal adhesions and uterointestinal adhesions  . left tib fib    . OTHER SURGICAL HISTORY  02/2009   Closed treatment internal fixation, left tibia with Tamala Julian and Nephew tibial nail locked proximal and distal  . TOTAL ABDOMINAL HYSTERECTOMY W/ BILATERAL SALPINGOOPHORECTOMY  08/2007   for endometriosis. With concurrent right salpingo-oophorectomy  (2009), left oophorectomy (1995)  . TUBAL LIGATION  08/2005   Right tubal ligation    There were no vitals filed for this visit.  Subjective Assessment - 11/30/19 1128    Subjective  Patient says her neck is somewhat better but is having problems with her jaw due to  TMJ.  Says she started having jaw pain on Saturday and couldn't move her jaw or talk. Went to ED and they pushed up on her jaw and made it feel better. Still hurts but hard to talk.    Pertinent History  Bilateral THA    Limitations  Sitting;Lifting;Standing;Walking;House hold activities    Currently in Pain?  Yes    Pain Score  8     Pain Location  Jaw    Pain Orientation  Right    Pain Descriptors / Indicators  Throbbing;Aching;Sore    Pain Type  Acute pain    Pain Onset  In the past 7 days    Pain Frequency  Constant                       [facility] Adult PT Treatment/Exercise - 11/30/19 0001      Neck Exercises: Standing   Other Standing Exercises  shoulder rows and extensions with GTB x20 each       Neck Exercises: Seated   Neck Retraction  10 reps;5 secs    Other Seated Exercise  pulley flexion 10x5" each; scap retraction 10 x5"     Other Seated Exercise  cervical excursions 3D 5 reps each      Manual Therapy   Manual Therapy  Soft tissue mobilization    Manual therapy comments  Manual complete separate than rest of tx    Soft tissue mobilization  IASTM using thergun to bilateral trapezius, levator and periscapular muscles    level 15              PT Short Term Goals - 11/16/19 1039      PT SHORT TERM GOAL #1   Title  Patient will be independant with initial HEP to improve functional outcomes    Time  3    Period  Weeks    Status  Achieved    Target Date  09/29/19      PT SHORT TERM GOAL #2   Title  Patient will report less than 5/10 pain globally, on average, at rest, to improve daily function    Baseline  Current: 4/10 average pain    Time  3    Period  Weeks    Status  Achieved    Target Date  09/29/19        PT Long Term Goals - 11/16/19 1040      PT LONG TERM GOAL #1   Title  Patient will report at least 50% improvement in cervical symptoms for improved quality of life.    Baseline  Current: 90% neck, 95% back improvement    Time   6    Period  Weeks    Status  Achieved  PT LONG TERM GOAL #2   Title  Patient will improve cervical spine ROM in all planes by 50% in order to be able to drive safely.    Baseline  See ROM    Time  6    Period  Weeks    Status  Achieved      PT LONG TERM GOAL #3   Title  Patient will experience neck/headache pain less than 3/10 in order to improve sleep quality.    Baseline  Current: 4/10 pain but down to 1 x headache per week at 7/10    Time  6    Period  Weeks    Status  On-going            Plan - 11/30/19 1208    Clinical Impression Statement  Activity graded per patient tolerance at todays visit. Patient able to perform cervical Rom, and scapular strengthening with minimal complaint. Patient did note some discomfort on RT side of neck with LT cervical side bending. Patient encouraged to perform all activity in pain free ROM. Patient with noted muscle restriction/ trigger point about RT upper trap today which was partially reduced with manual treatment. Patient reports no increased pain post session. Will resume prior ther ex as tolerated.    Personal Factors and Comorbidities  Comorbidity 2;Past/Current Experience;Time since onset of injury/illness/exacerbation    Comorbidities  Globalized pain in multiple sites    Examination-Activity Limitations  Bed Mobility;Sleep;Bend;Squat;Bathing;Sit;Carry;Stand;Dressing;Transfers;Lift;Locomotion Level;Stairs;Reach Overhead    Examination-Participation Restrictions  Cleaning;Community Activity;Yard Work;Laundry    Rehab Potential  Fair    PT Frequency  2x / week    PT Duration  3 weeks    PT Treatment/Interventions  ADLs/Self Care Home Management;DME Instruction;Biofeedback;Gait training;Stair training;Cryotherapy;Electrical Stimulation;Functional mobility training;Therapeutic activities;Moist Heat;Therapeutic exercise;Balance training;Manual techniques;Taping;Wheelchair mobility training;Orthotic Fit/Training;Patient/family  education;Energy conservation;Joint Manipulations;Spinal Manipulations;Passive range of motion;Neuromuscular re-education;Aquatic Therapy;Traction;Ultrasound;Splinting;Vestibular    PT Next Visit Plan  F/u with recent acute exacerbation. Continue to progress cervical and spine mobility as well as postural strengthening as tolerated by pain. continue postural strengthening with theraband. manual therapy as needed for muscular tension in UT, periscapulars. Add shoulder flexion, abduciton rasie next visit    PT Home Exercise Plan  2/3 deep breathing and body scan  10/03/19: LAQ 11/09/19 UT stretch 11/16/19 self stm with cane    Consulted and Agree with Plan of Care  Patient       Patient will benefit from skilled therapeutic intervention in order to improve the following deficits and impairments:  Abnormal gait, Decreased endurance, Decreased strength, Decreased activity tolerance, Decreased balance, Decreased mobility, Difficulty walking, Pain, Decreased range of motion, Impaired flexibility, Improper body mechanics, Postural dysfunction, Dizziness, Increased fascial restricitons, Increased muscle spasms, Impaired perceived functional ability  Visit Diagnosis: Acute left-sided low back pain without sciatica  Muscle weakness (generalized)  Other abnormalities of gait and mobility  Cervicalgia     Problem List Patient Active Problem List   Diagnosis Date Noted  . Concussion with no loss of consciousness 08/12/2018  . Acute pain of left shoulder 08/12/2018  . Low back pain 08/12/2018  . Allergic rhinitis 08/05/2018  . Arthritis 07/01/2018  . DDD (degenerative disc disease), lumbar 05/21/2018  . Thoracic arthritis 05/21/2018  . DDD (degenerative disc disease), cervical 01/06/2018  . Hidradenitis suppurativa 10/11/2017  . Chronic frontal sinusitis 10/11/2017  . Fatty liver 10/11/2017  . Chronic ethmoidal sinusitis 09/13/2017  . Photophobia of both eyes 09/13/2017  . Abnormal liver enzymes  09/13/2017  . History of hysterectomy  with bilateral oophorectomy 06/24/2017  . Estrogen deficiency 06/24/2017  . Overweight (BMI 25.0-29.9) 11/17/2016  . Abdominal pain 11/04/2016  . Headache 12/17/2015  . Chronic constipation 10/12/2012  . Hot flashes 02/20/2012  . Hiatal hernia 02/20/2012  . Chronic pain of both hips 02/20/2012  . Fatigue 12/19/2011  . Acetabulum intrapelvic protrusion into pelvic region or thigh 05/15/2011  . Annual physical exam 10/03/2010  . Hypertension 03/20/2010  . Vitamin D deficiency 08/20/2009  . Anxiety 05/18/2009  . Migraine headache 05/13/2006   12:12 PM, 11/30/19 Josue Hector PT DPT  Physical Therapist with [facility]  [phone]   [facility] [address] [place], [place], [address] Phone: [phone]   Fax:  [phone]  Name: ASHWAQ DEVANTIER MRN: [id number] Date of Birth: 04/04/1976

## 2019-12-02 DIAGNOSIS — G43109 Migraine with aura, not intractable, without status migrainosus: Secondary | ICD-10-CM | POA: Diagnosis not present

## 2019-12-02 DIAGNOSIS — H538 Other visual disturbances: Secondary | ICD-10-CM | POA: Diagnosis not present

## 2019-12-02 DIAGNOSIS — H524 Presbyopia: Secondary | ICD-10-CM | POA: Diagnosis not present

## 2019-12-02 DIAGNOSIS — H1013 Acute atopic conjunctivitis, bilateral: Secondary | ICD-10-CM | POA: Diagnosis not present

## 2019-12-02 DIAGNOSIS — H5319 Other subjective visual disturbances: Secondary | ICD-10-CM | POA: Diagnosis not present

## 2019-12-05 ENCOUNTER — Encounter (HOSPITAL_COMMUNITY): Payer: Self-pay | Admitting: Physical Therapy

## 2019-12-05 ENCOUNTER — Other Ambulatory Visit: Payer: Self-pay

## 2019-12-05 ENCOUNTER — Ambulatory Visit (HOSPITAL_COMMUNITY): Payer: BC Managed Care – PPO | Admitting: Physical Therapy

## 2019-12-05 DIAGNOSIS — M545 Low back pain, unspecified: Secondary | ICD-10-CM

## 2019-12-05 DIAGNOSIS — R2689 Other abnormalities of gait and mobility: Secondary | ICD-10-CM | POA: Diagnosis not present

## 2019-12-05 DIAGNOSIS — M6281 Muscle weakness (generalized): Secondary | ICD-10-CM

## 2019-12-05 DIAGNOSIS — M25551 Pain in right hip: Secondary | ICD-10-CM | POA: Diagnosis not present

## 2019-12-05 DIAGNOSIS — M542 Cervicalgia: Secondary | ICD-10-CM | POA: Diagnosis not present

## 2019-12-05 NOTE — Therapy (Signed)
[facility] [address] [place], [place], [address] Phone: [phone]   Fax:  [phone]  Physical Therapy Treatment  Patient Details  Name: [name] MRN: [id number] Date of Birth: Jul 06, 1976 Referring Provider (PT): McLean-Scocuzza, Olivia Mackie; Melvenia Beam, MD   Encounter Date: 12/05/2019  PT End of Session - 12/05/19 1132    Visit Number  20    Number of Visits  25    Date for PT Re-Evaluation  12/07/19    Authorization Type  Primary: Medicare Secondary: BCBS Tertiary: Medicaid of [place]    Progress Note Due on Visit  21    PT Start Time  1132    PT Stop Time  1210    PT Time Calculation (min)  38 min    Activity Tolerance  Patient tolerated treatment well    Behavior During Therapy  WFL for tasks assessed/performed       Past Medical History:  Diagnosis Date  . Abdominal pain, epigastric 12/17/2015  . Anxiety    pt denies having dx of anxiety  . BCC (basal cell carcinoma of skin)    right foot   . Bunion, right foot   . Chronic cough   . Closed fibular fracture 10/2008   Left - Sustained 2/2 fall down stairs (same fall as tibular fracture). S/P internal fixation.  . Duodenitis    EGD (07/14/2011) showing chronic duodenitis, H.pyloir neg  . Elevated liver enzymes   . Endometriosis    S/P TAH and salpingo-oophorectomy, right  . Epigastric pain    Chronic, thought 2/2 gastritis, EGD (07/14/2011) showing chronic duodenitis, H.pyloir neg // Repeat EGD (09/2011) - esophagealring, sessible polyp in stomach body, hiatal hernia - rec ad probiotic, await bx  . Esophageal ring    s/p dilatation during EGD (07/2011) - Dr. Oneida Alar  . Fatty liver   . Gastritis    Thought due to chronic NSAID use 2/2 pain from her congenital hip abnormality  . Headache(784.0)    Migraines  . Hidradenitis suppurativa 05/2006  . 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 With  chronic OA of hip  . History of migraine headaches    Typical symptoms - bright lights, unilateral (starting behind her left ear), throbbing .  Marland Kitchen Hot flashes   . Hypertension   . Internal hemorrhoids   . Osteoarthritis of hip   . Ovarian cancer ([place]) 1995  . Protrusio acetabuli    diagnosed at age 57  . Sessile colonic polyp    noted 09/2011 -- > rec colonoscopy in 10 years, 2023  . Surgical menopause 08/2007   On HRT. Occuring since 01/09 following TAH and R-salpingo-oophorectomy   . Tibial fracture 10/2008   Left - Sustained 2/2 fall down stairs. Patient is S/P closed internal fixation with Smith Nephew tibial nail locked proximal and distal  . Vitamin D deficiency 11/2009   Vit D level 15 in 11/2009    Past Surgical History:  Procedure Laterality Date  . ABDOMINAL HYSTERECTOMY    . CESAREAN SECTION  08/2005   Primary low transverse  . COLONOSCOPY  09/22/2011   [id number] polyp in the rectum/Internal hemorrhoids, benign path  . ESOPHAGOGASTRODUODENOSCOPY  09/22/2011   [id number], esophageal/Sessile polyp in the body of the stomach/Hiatal hernia/ABDOMINAL PAIN LIKELY FUNCTIONAL V. POST-INFECTIOUS IBS, path: mild chronic gastritis  . FRACTURE SURGERY     Right hip  . JOINT REPLACEMENT  right hip  . LAPAROSCOPY  10/2000   Operative laparoscopy with lysis of right adnexal adhesions and uterointestinal adhesions  . left tib fib    . OTHER SURGICAL HISTORY  02/2009   Closed treatment internal fixation, left tibia with Tamala Julian and Nephew tibial nail locked proximal and distal  . TOTAL ABDOMINAL HYSTERECTOMY W/ BILATERAL SALPINGOOPHORECTOMY  08/2007   for endometriosis. With concurrent right salpingo-oophorectomy  (2009), left oophorectomy (1995)  . TUBAL LIGATION  08/2005   Right tubal ligation    There were no vitals filed for this visit.  Subjective Assessment - 12/05/19 1133    Subjective  States she is feeling a lot better today. States she only has minor pain. Jaw is  feeling better.    Pertinent History  Bilateral THA    Limitations  Sitting;Lifting;Standing;Walking;House hold activities    Currently in Pain?  No/denies    Pain Onset  In the past 7 days         [facility] PT Assessment - 12/05/19 0001      Assessment   Medical Diagnosis  Neck pain, whiplash injuries    Referring Provider (PT)  McLean-Scocuzza, Olivia Mackie; Melvenia Beam, MD    Onset Date/Surgical Date  05/25/19    Next MD Visit  12/21/19                   [facility] Adult PT Treatment/Exercise - 12/05/19 0001      Neck Exercises: Seated   Cervical Rotation  10 reps;Both   5" holds   Other Seated Exercise  AAROM shoulder abduction with breathing and cane - 2x15 5" holds B ; shoulder ER arms tucked in to sides - 4x6 B with RTB ; UE RTB flexion/extension pulls - 4x5 B; Seated shoulder flexion with RTB 4x5 B    Other Seated Exercise  shoulder flexion cane - AAROM 2x15       Lumbar Exercises: Standing   Other Standing Lumbar Exercises  standing shoulder extension 4x5 5" holds B - performed unilaterally to hold onto chair for balance               PT Short Term Goals - 11/16/19 1039      PT SHORT TERM GOAL #1   Title  Patient will be independant with initial HEP to improve functional outcomes    Time  3    Period  Weeks    Status  Achieved    Target Date  09/29/19      PT SHORT TERM GOAL #2   Title  Patient will report less than 5/10 pain globally, on average, at rest, to improve daily function    Baseline  Current: 4/10 average pain    Time  3    Period  Weeks    Status  Achieved    Target Date  09/29/19        PT Long Term Goals - 11/16/19 1040      PT LONG TERM GOAL #1   Title  Patient will report at least 50% improvement in cervical symptoms for improved quality of life.    Baseline  Current: 90% neck, 95% back improvement    Time  6    Period  Weeks    Status  Achieved      PT LONG TERM GOAL #2   Title  Patient will improve cervical spine ROM in  all planes by 50% in order to be able to drive safely.    Baseline  See ROM    Time  6    Period  Weeks    Status  Achieved      PT LONG TERM GOAL #3   Title  Patient will experience neck/headache pain less than 3/10 in order to improve sleep quality.    Baseline  Current: 4/10 pain but down to 1 x headache per week at 7/10    Time  6    Period  Weeks    Status  On-going            Plan - 12/05/19 1235    Clinical Impression Statement  Significant improvement in tolerance to movement and exercise noted today. Added shoulder exercises with longer rest breaks to help with postural strength and functional mobility. Tolerated exercises moderately well with minimal increase in pain. Pateitn due for reassessment next session and follows up with MD 5/12. Patient is doing well and is compliant with home program. Will determine next session if continued therapy in required based on progress made.    Personal Factors and Comorbidities  Comorbidity 2;Past/Current Experience;Time since onset of injury/illness/exacerbation    Comorbidities  Globalized pain in multiple sites    Examination-Activity Limitations  Bed Mobility;Sleep;Bend;Squat;Bathing;Sit;Carry;Stand;Dressing;Transfers;Lift;Locomotion Level;Stairs;Reach Overhead    Examination-Participation Restrictions  Cleaning;Community Activity;Yard Work;Laundry    Rehab Potential  Fair    PT Frequency  2x / week    PT Duration  3 weeks    PT Treatment/Interventions  ADLs/Self Care Home Management;DME Instruction;Biofeedback;Gait training;Stair training;Cryotherapy;Electrical Stimulation;Functional mobility training;Therapeutic activities;Moist Heat;Therapeutic exercise;Balance training;Manual techniques;Taping;Wheelchair mobility training;Orthotic Fit/Training;Patient/family education;Energy conservation;Joint Manipulations;Spinal Manipulations;Passive range of motion;Neuromuscular re-education;Aquatic  Therapy;Traction;Ultrasound;Splinting;Vestibular    PT Next Visit Plan  PN. Continue to progress cervical and spine mobility as well as postural strengthening as tolerated by pain. continue postural strengthening with theraband. manual therapy as needed for muscular tension in UT, periscapulars. Add shoulder flexion, abduciton rasie next visit    PT Home Exercise Plan  2/3 deep breathing and body scan  10/03/19: LAQ 11/09/19 UT stretch 11/16/19 self stm with cane; 4/26 shoulder ROM with cane and resistance with RTB (provided to patient)    Consulted and Agree with Plan of Care  Patient       Patient will benefit from skilled therapeutic intervention in order to improve the following deficits and impairments:  Abnormal gait, Decreased endurance, Decreased strength, Decreased activity tolerance, Decreased balance, Decreased mobility, Difficulty walking, Pain, Decreased range of motion, Impaired flexibility, Improper body mechanics, Postural dysfunction, Dizziness, Increased fascial restricitons, Increased muscle spasms, Impaired perceived functional ability  Visit Diagnosis: Acute left-sided low back pain without sciatica  Muscle weakness (generalized)  Other abnormalities of gait and mobility  Cervicalgia     Problem List Patient Active Problem List   Diagnosis Date Noted  . Concussion with no loss of consciousness 08/12/2018  . Acute pain of left shoulder 08/12/2018  . Low back pain 08/12/2018  . Allergic rhinitis 08/05/2018  . Arthritis 07/01/2018  . DDD (degenerative disc disease), lumbar 05/21/2018  . Thoracic arthritis 05/21/2018  . DDD (degenerative disc disease), cervical 01/06/2018  . Hidradenitis suppurativa 10/11/2017  . Chronic frontal sinusitis 10/11/2017  . Fatty liver 10/11/2017  . Chronic ethmoidal sinusitis 09/13/2017  . Photophobia of both eyes 09/13/2017  . Abnormal liver enzymes 09/13/2017  . History of hysterectomy with bilateral oophorectomy 06/24/2017  .  Estrogen deficiency 06/24/2017  . Overweight (BMI 25.0-29.9) 11/17/2016  . Abdominal pain 11/04/2016  . Headache 12/17/2015  . Chronic constipation 10/12/2012  . Hot flashes 02/20/2012  .  Hiatal hernia 02/20/2012  . Chronic pain of both hips 02/20/2012  . Fatigue 12/19/2011  . Acetabulum intrapelvic protrusion into pelvic region or thigh 05/15/2011  . Annual physical exam 10/03/2010  . Hypertension 03/20/2010  . Vitamin D deficiency 08/20/2009  . Anxiety 05/18/2009  . Migraine headache 05/13/2006   12:38 PM, 12/05/19 Jerene Pitch, DPT Physical Therapy with [facility]  [phone] office  [facility] [address] [place], [place], [address] Phone: [phone]   Fax:  [phone]  Name: [name] MRN: [id number] Date of Birth: 1976/06/06

## 2019-12-07 ENCOUNTER — Ambulatory Visit (HOSPITAL_COMMUNITY): Payer: BC Managed Care – PPO | Admitting: Physical Therapy

## 2019-12-07 ENCOUNTER — Encounter (HOSPITAL_COMMUNITY): Payer: Self-pay | Admitting: Physical Therapy

## 2019-12-07 ENCOUNTER — Other Ambulatory Visit: Payer: Self-pay

## 2019-12-07 DIAGNOSIS — R2689 Other abnormalities of gait and mobility: Secondary | ICD-10-CM | POA: Diagnosis not present

## 2019-12-07 DIAGNOSIS — M545 Low back pain: Secondary | ICD-10-CM | POA: Diagnosis not present

## 2019-12-07 DIAGNOSIS — M542 Cervicalgia: Secondary | ICD-10-CM | POA: Diagnosis not present

## 2019-12-07 DIAGNOSIS — M6281 Muscle weakness (generalized): Secondary | ICD-10-CM

## 2019-12-07 DIAGNOSIS — M25551 Pain in right hip: Secondary | ICD-10-CM | POA: Diagnosis not present

## 2019-12-07 NOTE — Therapy (Signed)
[facility] [address] [place], [place], [address] Phone: [phone]   Fax:  [phone]  Physical Therapy Treatment/ Discharge Summary  Patient Details  Name: [name] MRN: [id number] Date of Birth: 46/30/1977 Referring Provider (PT): McLean-Scocuzza, Tracy; Ahern, Antonia B, MD   Encounter Date: 12/07/2019   PHYSICAL THERAPY DISCHARGE SUMMARY  Visits from Start of Care: 21  Current functional level related to goals / functional outcomes: See below   Remaining deficits: See below    Education / Equipment: See assessment  Plan: Patient agrees to discharge.  Patient goals were met. Patient is being discharged due to meeting the stated rehab goals.  ?????       PT End of Session - 12/07/19 1128    Visit Number  21    Number of Visits  25    Date for PT Re-Evaluation  12/07/19    Authorization Type  Primary: Medicare Secondary: BCBS Tertiary: Medicaid of [place]    Authorization - Visit Number  15    Authorization - Number of Visits  10    Progress Note Due on Visit  21    PT Start Time  1120    PT Stop Time  1200    PT Time Calculation (min)  40 min    Activity Tolerance  Patient tolerated treatment well    Behavior During Therapy  WFL for tasks assessed/performed       Past Medical History:  Diagnosis Date  . Abdominal pain, epigastric 12/17/2015  . Anxiety    pt denies having dx of anxiety  . BCC (basal cell carcinoma of skin)    right foot   . Bunion, right foot   . Chronic cough   . Closed fibular fracture 10/2008   Left - Sustained 2/2 fall down stairs (same fall as tibular fracture). S/P internal fixation.  . Duodenitis    EGD (07/14/2011) showing chronic duodenitis, H.pyloir neg  . Elevated liver enzymes   . Endometriosis    S/P TAH and salpingo-oophorectomy, right  . Epigastric pain    Chronic, thought 2/2 gastritis, EGD (07/14/2011) showing chronic duodenitis, H.pyloir neg // Repeat EGD  (09/2011) - esophagealring, sessible polyp in stomach body, hiatal hernia - rec ad probiotic, await bx  . Esophageal ring    s/p dilatation during EGD (07/2011) - Dr. Fields  . Fatty liver   . Gastritis    Thought due to chronic NSAID use 2/2 pain from her congenital hip abnormality  . Headache(784.0)    Migraines  . Hidradenitis suppurativa 05/2006  . 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 With chronic OA of hip  . History of migraine headaches    Typical symptoms - bright lights, unilateral (starting behind her left ear), throbbing .  . Hot flashes   . Hypertension   . Internal hemorrhoids   . Osteoarthritis of hip   . Ovarian cancer (HCC) 1995  . Protrusio acetabuli    diagnosed at age 14  . Sessile colonic polyp    noted 09/2011 -- > rec colonoscopy in 10 years, 2023  . Surgical menopause 08/2007   On HRT. Occuring since 01/09 following TAH and R-salpingo-oophorectomy   . Tibial fracture 10/2008   Left - Sustained 2/2 fall down stairs. Patient is S/P closed internal fixation with Smith Nephew tibial nail locked proximal and distal  . Vitamin D deficiency 11/2009   Vit D level 15   in 11/2009    Past Surgical History:  Procedure Laterality Date  . ABDOMINAL HYSTERECTOMY    . CESAREAN SECTION  08/2005   Primary low transverse  . COLONOSCOPY  09/22/2011   QRF:XJOITGP polyp in the rectum/Internal hemorrhoids, benign path  . ESOPHAGOGASTRODUODENOSCOPY  09/22/2011   QDI:YMEB, esophageal/Sessile polyp in the body of the stomach/Hiatal hernia/ABDOMINAL PAIN LIKELY FUNCTIONAL V. POST-INFECTIOUS IBS, path: mild chronic gastritis  . FRACTURE SURGERY     Right hip  . JOINT REPLACEMENT     right hip  . LAPAROSCOPY  10/2000   Operative laparoscopy with lysis of right adnexal adhesions and uterointestinal adhesions  . left tib fib    . OTHER SURGICAL HISTORY  02/2009   Closed treatment internal fixation, left tibia  with Tamala Julian and Nephew tibial nail locked proximal and distal  . TOTAL ABDOMINAL HYSTERECTOMY W/ BILATERAL SALPINGOOPHORECTOMY  08/2007   for endometriosis. With concurrent right salpingo-oophorectomy  (2009), left oophorectomy (1995)  . TUBAL LIGATION  08/2005   Right tubal ligation    There were no vitals filed for this visit.  Subjective Assessment - 12/07/19 1126    Subjective  Patient says she is doing really well. Says neck pain is not quite as bad as before, has not had a headache in 2 weeks. Says she still feels her neck is puffy, which concerns her. Pain about a 2/10 currently.    Pertinent History  Bilateral THA    Limitations  Sitting;Lifting;Standing;Walking;House hold activities    Currently in Pain?  Yes    Pain Score  2     Pain Location  Neck    Pain Orientation  Left;Posterior;Lower    Pain Descriptors / Indicators  Aching;Sore    Pain Type  Acute pain    Pain Onset  More than a month ago    Pain Frequency  Constant    Aggravating Factors   movement, turning, lifitng    Pain Relieving Factors  rest    Effect of Pain on Daily Activities  Limits         [facility] PT Assessment - 12/07/19 0001      Assessment   Medical Diagnosis  Neck pain, whiplash injuries    Referring Provider (PT)  McLean-Scocuzza, Olivia Mackie; Melvenia Beam, MD    Onset Date/Surgical Date  05/25/19    Next MD Visit  12/21/19      [place] residence      Prior Function   Level of Independence  Independent with basic ADLs      Cognition   Overall Cognitive Status  Within Functional Limits for tasks assessed      Observation/Other Assessments   Focus on Therapeutic Outcomes (FOTO)   25% limited    60% limited      AROM   Overall AROM Comments  Still with mod restriciton in bilateral elevation    Cervical Flexion  32    Cervical Extension  18    Cervical - Right Rotation  55    Cervical - Left Rotation  44      Strength   Overall Strength Comments   shoulder flex and abduction graded through available ROM    Right Shoulder Flexion  4/5    Right Shoulder ABduction  4/5    Right Shoulder External Rotation  4/5    Left Shoulder Flexion  4/5    Left Shoulder ABduction  4/5    Left Shoulder External Rotation  4/5                           PT Education - 12/07/19 1127    Education Details  on reassessment findings and transition to DC    Person(s) Educated  Patient    Methods  Explanation    Comprehension  Verbalized understanding       PT Short Term Goals - 11/16/19 1039      PT SHORT TERM GOAL #1   Title  Patient will be independant with initial HEP to improve functional outcomes    Time  3    Period  Weeks    Status  Achieved    Target Date  09/29/19      PT SHORT TERM GOAL #2   Title  Patient will report less than 5/10 pain globally, on average, at rest, to improve daily function    Baseline  Current: 4/10 average pain    Time  3    Period  Weeks    Status  Achieved    Target Date  09/29/19        PT Long Term Goals - 12/07/19 1146      PT LONG TERM GOAL #1   Title  Patient will report at least 50% improvement in cervical symptoms for improved quality of life.    Baseline  Current: 90% neck, 95% back improvement    Time  6    Period  Weeks    Status  Achieved      PT LONG TERM GOAL #2   Title  Patient will improve cervical spine ROM in all planes by 50% in order to be able to drive safely.    Baseline  See ROM    Time  6    Period  Weeks    Status  Achieved      PT LONG TERM GOAL #3   Title  Patient will experience neck/headache pain less than 3/10 in order to improve sleep quality.    Baseline  Current: 2/10 pain , no headache for 2 weeks    Time  6    Period  Weeks    Status  Achieved            Plan - 12/07/19 1311    Clinical Impression Statement  Patient has made very good progress toward therapy goals, and is currently with all goals met. Patient still limited by mild  neck pain, but shows significant improvement in mobility, activity tolerance, frequency of headaches and overall subjective complaint of pain. Patient being DC today to transition to independent with home exercise program. Encouraged, and answered, all patient questions regarding DC status. Patient agrees to this plan. Patient instructed to follow up with therapy services with any further questions or concerns.    Personal Factors and Comorbidities  Comorbidity 2;Past/Current Experience;Time since onset of injury/illness/exacerbation    Comorbidities  Globalized pain in multiple sites    Examination-Activity Limitations  Bed Mobility;Sleep;Bend;Squat;Bathing;Sit;Carry;Stand;Dressing;Transfers;Lift;Locomotion Level;Stairs;Reach Overhead    Examination-Participation Restrictions  Cleaning;Community Activity;Yard Work;Laundry    Rehab Potential  Fair    PT Treatment/Interventions  ADLs/Self Care Home Management;DME Instruction;Biofeedback;Gait training;Stair training;Cryotherapy;Electrical Stimulation;Functional mobility training;Therapeutic activities;Moist Heat;Therapeutic exercise;Balance training;Manual techniques;Taping;Wheelchair mobility training;Orthotic Fit/Training;Patient/family education;Energy conservation;Joint Manipulations;Spinal Manipulations;Passive range of motion;Neuromuscular re-education;Aquatic Therapy;Traction;Ultrasound;Splinting;Vestibular    PT Next Visit Plan  DC    PT Home Exercise Plan  2/3 deep breathing and body scan  10/03/19: LAQ 11/09/19 UT stretch 11/16/19 self  stm with cane; 4/26 shoulder ROM with cane and resistance with RTB (provided to patient)    Consulted and Agree with Plan of Care  Patient       Patient will benefit from skilled therapeutic intervention in order to improve the following deficits and impairments:  Abnormal gait, Decreased endurance, Decreased strength, Decreased activity tolerance, Decreased balance, Decreased mobility, Difficulty walking, Pain,  Decreased range of motion, Impaired flexibility, Improper body mechanics, Postural dysfunction, Dizziness, Increased fascial restricitons, Increased muscle spasms, Impaired perceived functional ability  Visit Diagnosis: Muscle weakness (generalized)  Other abnormalities of gait and mobility  Cervicalgia     Problem List Patient Active Problem List   Diagnosis Date Noted  . Concussion with no loss of consciousness 08/12/2018  . Acute pain of left shoulder 08/12/2018  . Low back pain 08/12/2018  . Allergic rhinitis 08/05/2018  . Arthritis 07/01/2018  . DDD (degenerative disc disease), lumbar 05/21/2018  . Thoracic arthritis 05/21/2018  . DDD (degenerative disc disease), cervical 01/06/2018  . Hidradenitis suppurativa 10/11/2017  . Chronic frontal sinusitis 10/11/2017  . Fatty liver 10/11/2017  . Chronic ethmoidal sinusitis 09/13/2017  . Photophobia of both eyes 09/13/2017  . Abnormal liver enzymes 09/13/2017  . History of hysterectomy with bilateral oophorectomy 06/24/2017  . Estrogen deficiency 06/24/2017  . Overweight (BMI 25.0-29.9) 11/17/2016  . Abdominal pain 11/04/2016  . Headache 12/17/2015  . Chronic constipation 10/12/2012  . Hot flashes 02/20/2012  . Hiatal hernia 02/20/2012  . Chronic pain of both hips 02/20/2012  . Fatigue 12/19/2011  . Acetabulum intrapelvic protrusion into pelvic region or thigh 05/15/2011  . Annual physical exam 10/03/2010  . Hypertension 03/20/2010  . Vitamin D deficiency 08/20/2009  . Anxiety 05/18/2009  . Migraine headache 05/13/2006   1:22 PM, 12/07/19   PT DPT  Physical Therapist with [place]  [facility]  [phone]   [facility] [address] [place], [place], [address] Phone: [phone]   Fax:  [phone]  Name: Raneisha R Glendening MRN: [id number] Date of Birth: 06/23/1976   

## 2019-12-21 ENCOUNTER — Ambulatory Visit (INDEPENDENT_AMBULATORY_CARE_PROVIDER_SITE_OTHER): Payer: BC Managed Care – PPO | Admitting: Internal Medicine

## 2019-12-21 ENCOUNTER — Other Ambulatory Visit: Payer: Self-pay

## 2019-12-21 ENCOUNTER — Encounter: Payer: Self-pay | Admitting: Internal Medicine

## 2019-12-21 VITALS — BP 124/86 | HR 61 | Temp 97.6°F | Ht 72.0 in | Wt 203.8 lb

## 2019-12-21 DIAGNOSIS — K760 Fatty (change of) liver, not elsewhere classified: Secondary | ICD-10-CM | POA: Diagnosis not present

## 2019-12-21 DIAGNOSIS — E663 Overweight: Secondary | ICD-10-CM

## 2019-12-21 DIAGNOSIS — R739 Hyperglycemia, unspecified: Secondary | ICD-10-CM

## 2019-12-21 DIAGNOSIS — N3289 Other specified disorders of bladder: Secondary | ICD-10-CM

## 2019-12-21 DIAGNOSIS — B373 Candidiasis of vulva and vagina: Secondary | ICD-10-CM | POA: Diagnosis not present

## 2019-12-21 DIAGNOSIS — R339 Retention of urine, unspecified: Secondary | ICD-10-CM

## 2019-12-21 DIAGNOSIS — E876 Hypokalemia: Secondary | ICD-10-CM | POA: Diagnosis not present

## 2019-12-21 DIAGNOSIS — M542 Cervicalgia: Secondary | ICD-10-CM

## 2019-12-21 DIAGNOSIS — I10 Essential (primary) hypertension: Secondary | ICD-10-CM

## 2019-12-21 DIAGNOSIS — R600 Localized edema: Secondary | ICD-10-CM

## 2019-12-21 DIAGNOSIS — L811 Chloasma: Secondary | ICD-10-CM | POA: Insufficient documentation

## 2019-12-21 DIAGNOSIS — B3731 Acute candidiasis of vulva and vagina: Secondary | ICD-10-CM

## 2019-12-21 DIAGNOSIS — K581 Irritable bowel syndrome with constipation: Secondary | ICD-10-CM

## 2019-12-21 MED ORDER — HYDROQUINONE 4 % EX CREA: TOPICAL_CREAM | Freq: Every day | CUTANEOUS | 5 refills | Status: DC

## 2019-12-21 MED ORDER — FUROSEMIDE 20 MG PO TABS: 20.0000 mg | ORAL_TABLET | Freq: Every day | ORAL | 0 refills | Status: DC | PRN

## 2019-12-21 MED ORDER — FLUCONAZOLE 150 MG PO TABS: 150.0000 mg | ORAL_TABLET | Freq: Once | ORAL | 0 refills | Status: AC

## 2019-12-21 NOTE — Patient Instructions (Addendum)
[facility] probiotics (prebiotic and probiotic) [facility] or [facility]  Call GI and ask sample of trulance for constipation I[facility]  [address] [place]  [place], [place] [address]  [phone]  [facility] [place], [place], [place] [address]  [phone]  [phone] (Fax)      The next 56 days   Hydroquinone nightly for 3-4 months then go to twice weekly. Discussed if flares, then go back to nightly for 3-4 months at a time  Start Revision Vitamin C corrector each morning  Discussed excision of EIC vs doing nothing  Recommend Cerave hydrating cleanser and cream daily  Amlactin for the legs twice daily  Recommend HCT 2.5% cream 1-2 times daily for irritating spot on the right eyebrow  Also discussed chemical peels for hyperpigmentation on the face      Melasma Melasma is a skin condition that causes areas of darker coloring. It usually appears in patches on the cheeks, forehead, upper lip, and neck. These patches can look like a mask. The discolored areas do not itch and are not red or swollen. Melasma is not contagious. This means that it does not spread from person to person. What are the causes? The cause of this condition is not known. However, it can be started by certain triggers, such as:  Being out in the sun.  Allergies to medicines or cosmetics.  Changes in your hormones, such as: ? Taking birth control medicines. ? Taking hormone replacement therapy. ? Being pregnant. What increases the risk? The following factors may make you more likely to develop this condition:  Being a woman. Melasma is less common in men.  Having a family history of melasma.  Having darker skin.  Living in a tropical climate. What are the signs or symptoms? The only symptom of this condition is dark or tan patches on the skin. How is this diagnosed? This condition is diagnosed based on:  A physical exam.  Your health care provider will examine the physical appearance of your skin. He or she may use an ultraviolet light, called a Wood lamp, to look more closely at your skin.  Biopsy. A small sample of your skin is taken and examined under a microscope. This is done to make sure your melasma is not caused by another skin condition, such as skin cancer. How is this treated? There is no cure for this condition. However, there are treatments that may lighten the color of the darker patches. Treatment may include:  Medicines, such as bleaching or steroid creams.  Facial or chemical peels.  Laser treatment.  Dermabrasion or microdermabrasion. These procedures use fine instruments to scrape and remove the outer layer of skin in order to grow new, healthy-looking skin. Your melasma may also go away on its own over time. Follow these instructions at home:  Lifestyle  Avoid overexposure to the sun, especially in tropical areas.  Wear sunscreen with an SPF of 30 or higher every day.  Wear a hat that protects your face from the sun.  Use gentle cosmetics that are meant for sensitive skin.  Do not use wax to remove excess hair in areas where you have or have had melasma. General instructions  Take or apply over-the-counter and prescription medicines only as told by your health care provider.  Keep all follow-up visits as told by your health care provider. This is important. Contact a health care provider if:  You have new symptoms.  Your symptoms get worse.  Your affected skin areas are: ? Bleeding. ? Irritated. Summary  Melasma is a skin condition that causes areas of darker coloring that do not itch and are not red or swollen.  The cause of this condition is not known. However, it can be started by certain triggers such as sun exposure, allergies to medicines or cosmetics, or changes in your hormones.  Risk factors include being a woman, having a family history of melasma, having  darker skin, or living in a tropical climate.  There is no cure for this condition. However, there are treatments that may lighten the color of the darker patches. They include medicine, facial or chemical peels, laser treatment, dermabrasion, or microdermabrasion. This information is not intended to replace advice given to you by your health care provider. Make sure you discuss any questions you have with your health care provider. Document Revised: 08/10/2017 Document Reviewed: 08/10/2017 Elsevier Patient Education  2020 [employer].   Exercising to Lose Weight Exercise is structured, repetitive physical activity to improve fitness and health. Getting regular exercise is important for everyone. It is especially important if you are overweight. Being overweight increases your risk of heart disease, stroke, diabetes, high blood pressure, and several types of cancer. Reducing your calorie intake and exercising can help you lose weight. Exercise is usually categorized as moderate or vigorous intensity. To lose weight, most people need to do a certain amount of moderate-intensity or vigorous-intensity exercise each week. Moderate-intensity exercise  Moderate-intensity exercise is any activity that gets you moving enough to burn at least three times more energy (calories) than if you were sitting. Examples of moderate exercise include:  Walking a mile in 15 minutes.  Doing light yard work.  Biking at an easy pace. Most people should get at least 150 minutes (2 hours and 30 minutes) a week of moderate-intensity exercise to maintain their body weight. Vigorous-intensity exercise Vigorous-intensity exercise is any activity that gets you moving enough to burn at least six times more calories than if you were sitting. When you exercise at this intensity, you should be working hard enough that you are not able to carry on a conversation. Examples of vigorous exercise include:  Running.  Playing a  team sport, such as football, basketball, and soccer.  Jumping rope. Most people should get at least 75 minutes (1 hour and 15 minutes) a week of vigorous-intensity exercise to maintain their body weight. How can exercise affect me? When you exercise enough to burn more calories than you eat, you lose weight. Exercise also reduces body fat and builds muscle. The more muscle you have, the more calories you burn. Exercise also:  Improves mood.  Reduces stress and tension.  Improves your overall fitness, flexibility, and endurance.  Increases bone strength. The amount of exercise you need to lose weight depends on:  Your age.  The type of exercise.  Any health conditions you have.  Your overall physical ability. Talk to your health care provider about how much exercise you need and what types of activities are safe for you. What actions can I take to lose weight? Nutrition   Make changes to your diet as told by your health care provider or diet and nutrition specialist (dietitian). This may include: ? Eating fewer calories. ? Eating more protein. ? Eating less unhealthy fats. ? Eating a diet that includes fresh fruits and vegetables, whole grains, low-fat dairy products, and lean protein. ? Avoiding foods with added fat, salt, and  sugar.  Drink plenty of water while you exercise to prevent dehydration or heat stroke. Activity  Choose an activity that you enjoy and set realistic goals. Your health care provider can help you make an exercise plan that works for you.  Exercise at a moderate or vigorous intensity most days of the week. ? The intensity of exercise may vary from person to person. You can tell how intense a workout is for you by paying attention to your breathing and heartbeat. Most people will notice their breathing and heartbeat get faster with more intense exercise.  Do resistance training twice each week, such as: ? Push-ups. ? Sit-ups. ? Lifting  weights. ? Using resistance bands.  Getting short amounts of exercise can be just as helpful as long structured periods of exercise. If you have trouble finding time to exercise, try to include exercise in your daily routine. ? Get up, stretch, and walk around every 30 minutes throughout the day. ? Go for a walk during your lunch break. ? Park your car farther away from your destination. ? If you take public transportation, get off one stop early and walk the rest of the way. ? Make phone calls while standing up and walking around. ? Take the stairs instead of elevators or escalators.  Wear comfortable clothes and shoes with good support.  Do not exercise so much that you hurt yourself, feel dizzy, or get very short of breath. Where to find more information  U.S[place] Department of Health and Human Services: [employer]at  Centers for Disease Control and Prevention (CDC): http://www.wolf.info/ Contact a health care provider:  Before starting a new exercise program.  If you have questions or concerns about your weight.  If you have a medical problem that keeps you from exercising. Get help right away if you have any of the following while exercising:  Injury.  Dizziness.  Difficulty breathing or shortness of breath that does not go away when you stop exercising.  Chest pain.  Rapid heartbeat. Summary  Being overweight increases your risk of heart disease, stroke, diabetes, high blood pressure, and several types of cancer.  Losing weight happens when you burn more calories than you eat.  Reducing the amount of calories you eat in addition to getting regular moderate or vigorous exercise each week helps you lose weight. This information is not intended to replace advice given to you by your health care provider. Make sure you discuss any questions you have with your health care provider. Document Revised: 08/10/2017 Document Reviewed: 08/10/2017 Elsevier Patient Education  2020 [employer]  FODMAPs (fermentable oligosaccharides, disaccharides, monosaccharides, and polyols) are sugars that are hard for some people to digest. A low-FODMAP eating plan may help some people who have bowel (intestinal) diseases to manage their symptoms. This meal plan can be complicated to follow. Work with a diet and nutrition specialist (dietitian) to make a low-FODMAP eating plan that is right for you. A dietitian can make sure that you get enough nutrition from this diet. What are tips for following this plan? Reading food labels  Check labels for hidden FODMAPs such as: ? High-fructose syrup. ? Honey. ? Agave. ? Natural fruit flavors. ? Onion or garlic powder.  Choose low-FODMAP foods that contain 3-4 grams of fiber per serving.  Check food labels for serving sizes. Eat only one serving at a time to make sure FODMAP levels stay low. Meal planning  Follow a low-FODMAP eating plan for up to 6 weeks,  or as told by your health care provider or dietitian.  To follow the eating plan: 1. Eliminate high-FODMAP foods from your diet completely. 2. Gradually reintroduce high-FODMAP foods into your diet one at a time. Most people should wait a few days after introducing one high-FODMAP food before they introduce the next high-FODMAP food. Your dietitian can recommend how quickly you may reintroduce foods. 3. Keep a daily record of what you eat and drink, and make note of any symptoms that you have after eating. 4. Review your daily record with a dietitian regularly. Your dietitian can help you identify which foods you can eat and which foods you should avoid. General tips  Drink enough fluid each day to keep your urine pale yellow.  Avoid processed foods. These often have added sugar and may be high in FODMAPs.  Avoid most dairy products, whole grains, and sweeteners.  Work with a dietitian to make sure you get enough fiber in your diet. Recommended  foods Grains  Gluten-free grains, such as rice, oats, buckwheat, quinoa, corn, polenta, and millet. Gluten-free pasta, bread, or cereal. Rice noodles. Corn tortillas. Vegetables  Eggplant, zucchini, cucumber, peppers, green beans, Brussels sprouts, bean sprouts, lettuce, arugula, kale, Swiss chard, spinach, collard greens, bok choy, summer squash, potato, and tomato. Limited amounts of corn, carrot, and sweet potato. Green parts of scallions. Fruits  Bananas, oranges, lemons, limes, blueberries, raspberries, strawberries, grapes, cantaloupe, honeydew melon, kiwi, papaya, passion fruit, and pineapple. Limited amounts of dried cranberries, banana chips, and shredded coconut. Dairy  Lactose-free milk, yogurt, and kefir. Lactose-free cottage cheese and ice cream. Non-dairy milks, such as almond, coconut, hemp, and rice milk. Yogurts made of non-dairy milks. Limited amounts of goat cheese, brie, mozzarella, parmesan, swiss, and other hard cheeses. Meats and other protein foods  Unseasoned beef, pork, poultry, or fish. Eggs. Berniece Salines. Tofu (firm) and tempeh. Limited amounts of nuts and seeds, such as almonds, walnuts, [place] nuts, pecans, peanuts, pumpkin seeds, chia seeds, and sunflower seeds. Fats and oils  Butter-free spreads. Vegetable oils, such as olive, canola, and sunflower oil. Seasoning and other foods  Artificial sweeteners with names that do not end in "ol" such as aspartame, saccharine, and stevia. Maple syrup, white table sugar, raw sugar, brown sugar, and molasses. Fresh basil, coriander, parsley, rosemary, and thyme. Beverages  Water and mineral water. Sugar-sweetened soft drinks. Small amounts of orange juice or cranberry juice. Black and green tea. Most dry wines. Coffee. This may not be a complete list of low-FODMAP foods. Talk with your dietitian for more information. Foods to avoid Grains  Wheat, including kamut, durum, and semolina. Barley and bulgur. Couscous. Wheat-based  cereals. Wheat noodles, bread, crackers, and pastries. Vegetables  Chicory root, artichoke, asparagus, cabbage, snow peas, sugar snap peas, mushrooms, and cauliflower. Onions, garlic, leeks, and the white part of scallions. Fruits  Fresh, dried, and juiced forms of apple, pear, watermelon, peach, plum, cherries, apricots, blackberries, boysenberries, figs, nectarines, and mango. Avocado. Dairy  Milk, yogurt, ice cream, and soft cheese. Cream and sour cream. Milk-based sauces. Custard. Meats and other protein foods  Fried or fatty meat. Sausage. Cashews and pistachios. Soybeans, baked beans, black beans, chickpeas, kidney beans, fava beans, navy beans, lentils, and split peas. Seasoning and other foods  Any sugar-free gum or candy. Foods that contain artificial sweeteners such as sorbitol, mannitol, isomalt, or xylitol. Foods that contain honey, high-fructose corn syrup, or agave. Bouillon, vegetable stock, beef stock, and chicken stock. Garlic and onion powder. Condiments made with onion, such  as hummus, chutney, pickles, relish, salad dressing, and salsa. Tomato paste. Beverages  Chicory-based drinks. Coffee substitutes. Chamomile tea. Fennel tea. Sweet or fortified wines such as port or sherry. Diet soft drinks made with isomalt, mannitol, maltitol, sorbitol, or xylitol. Apple, pear, and mango juice. Juices with high-fructose corn syrup. This may not be a complete list of high-FODMAP foods. Talk with your dietitian to discuss what dietary choices are best for you.  Summary  A low-FODMAP eating plan is a short-term diet that eliminates FODMAPs from your diet to help ease symptoms of certain bowel diseases.  The eating plan usually lasts up to 6 weeks. After that, high-FODMAP foods are restarted gradually, one at a time, so you can find out which may be causing symptoms.  A low-FODMAP eating plan can be complicated. It is best to work with a dietitian who has experience with this type of  plan. This information is not intended to replace advice given to you by your health care provider. Make sure you discuss any questions you have with your health care provider. Document Revised: 07/10/2017 Document Reviewed: 03/24/2017 Elsevier Patient Education  2020 [employer].    Menopause Menopause is the normal time of life when menstrual periods stop completely. It is usually confirmed by 12 months without a menstrual period. The transition to menopause (perimenopause) most often happens between the ages of 24 and 63. During perimenopause, hormone levels change in your body, which can cause symptoms and affect your health. Menopause may increase your risk for:  Loss of bone (osteoporosis), which causes bone breaks (fractures).  Depression.  Hardening and narrowing of the arteries (atherosclerosis), which can cause heart attacks and strokes. What are the causes? This condition is usually caused by a natural change in hormone levels that happens as you get older. The condition may also be caused by surgery to remove both ovaries (bilateral oophorectomy). What increases the risk? This condition is more likely to start at an earlier age if you have certain medical conditions or treatments, including:  A tumor of the pituitary gland in the brain.  A disease that affects the ovaries and hormone production.  Radiation treatment for cancer.  Certain cancer treatments, such as chemotherapy or hormone (anti-estrogen) therapy.  Heavy smoking and excessive alcohol use.  Family history of early menopause. This condition is also more likely to develop earlier in women who are very thin. What are the signs or symptoms? Symptoms of this condition include:  Hot flashes.  Irregular menstrual periods.  Night sweats.  Changes in feelings about sex. This could be a decrease in sex drive or an increased comfort around your sexuality.  Vaginal dryness and thinning of the vaginal walls.  This may cause painful intercourse.  Dryness of the skin and development of wrinkles.  Headaches.  Problems sleeping (insomnia).  Mood swings or irritability.  Memory problems.  Weight gain.  Hair growth on the face and chest.  Bladder infections or problems with urinating. How is this diagnosed? This condition is diagnosed based on your medical history, a physical exam, your age, your menstrual history, and your symptoms. Hormone tests may also be done. How is this treated? In some cases, no treatment is needed. You and your health care provider should make a decision together about whether treatment is necessary. Treatment will be based on your individual condition and preferences. Treatment for this condition focuses on managing symptoms. Treatment may include:  Menopausal hormone therapy (MHT).  Medicines to treat specific symptoms or  complications.  Acupuncture.  Vitamin or herbal supplements. Before starting treatment, make sure to let your health care provider know if you have a personal or family history of:  Heart disease.  Breast cancer.  Blood clots.  Diabetes.  Osteoporosis. Follow these instructions at home: Lifestyle  Do not use any products that contain nicotine or tobacco, such as cigarettes and e-cigarettes. If you need help quitting, ask your health care provider.  Get at least 30 minutes of physical activity on 5 or more days each week.  Avoid alcoholic and caffeinated beverages, as well as spicy foods. This may help prevent hot flashes.  Get 7-8 hours of sleep each night.  If you have hot flashes, try: ? Dressing in layers. ? Avoiding things that may trigger hot flashes, such as spicy food, warm places, or stress. ? Taking slow, deep breaths when a hot flash starts. ? Keeping a fan in your home and office.  Find ways to manage stress, such as deep breathing, meditation, or journaling.  Consider going to group therapy with other women who  are having menopause symptoms. Ask your health care provider about recommended group therapy meetings. Eating and drinking  Eat a healthy, balanced diet that contains whole grains, lean protein, low-fat dairy, and plenty of fruits and vegetables.  Your health care provider may recommend adding more soy to your diet. Foods that contain soy include tofu, tempeh, and soy milk.  Eat plenty of foods that contain calcium and vitamin D for bone health. Items that are rich in calcium include low-fat milk, yogurt, beans, almonds, sardines, broccoli, and kale. Medicines  Take over-the-counter and prescription medicines only as told by your health care provider.  Talk with your health care provider before starting any herbal supplements. If prescribed, take vitamins and supplements as told by your health care provider. These may include: ? Calcium. Women age 70 and older should get 1,200 mg (milligrams) of calcium every day. ? Vitamin D. Women need 600-800 International Units of vitamin D each day. ? Vitamins B12 and B6. Aim for 50 micrograms of B12 and 1.5 mg of B6 each day. General instructions  Keep track of your menstrual periods, including: ? When they occur. ? How heavy they are and how long they last. ? How much time passes between periods.  Keep track of your symptoms, noting when they start, how often you have them, and how long they last.  Use vaginal lubricants or moisturizers to help with vaginal dryness and improve comfort during sex.  Keep all follow-up visits as told by your health care provider. This is important. This includes any group therapy or counseling. Contact a health care provider if:  You are still having menstrual periods after age 41.  You have pain during sex.  You have not had a period for 12 months and you develop vaginal bleeding. Get help right away if:  You have: ? Severe depression. ? Excessive vaginal bleeding. ? Pain when you urinate. ? A fast or  irregular heart beat (palpitations). ? Severe headaches. ? Abdomen (abdominal) pain or severe indigestion.  You fell and you think you have a broken bone.  You develop leg or chest pain.  You develop vision problems.  You feel a lump in your breast. Summary  Menopause is the normal time of life when menstrual periods stop completely. It is usually confirmed by 12 months without a menstrual period.  The transition to menopause (perimenopause) most often happens between the ages of 24  and 55.  Symptoms can be managed through medicines, lifestyle changes, and complementary therapies such as acupuncture.  Eat a balanced diet that is rich in nutrients to promote bone health and heart health and to manage symptoms during menopause. This information is not intended to replace advice given to you by your health care provider. Make sure you discuss any questions you have with your health care provider. Document Revised: 07/10/2017 Document Reviewed: 08/30/2016 Elsevier Patient Education  2020 [employer].    Hypokalemia Hypokalemia means that the amount of potassium in the blood is lower than normal. Potassium is a chemical (electrolyte) that helps regulate the amount of fluid in the body. It also stimulates muscle tightening (contraction) and helps nerves work properly. Normally, most of the body's potassium is inside cells, and only a very small amount is in the blood. Because the amount in the blood is so small, minor changes to potassium levels in the blood can be life-threatening. What are the causes? This condition may be caused by:  Antibiotic medicine.  Diarrhea or vomiting. Taking too much of a medicine that helps you have a bowel movement (laxative) can cause diarrhea and lead to hypokalemia.  Chronic kidney disease (CKD).  Medicines that help the body get rid of excess fluid (diuretics).  Eating disorders, such as bulimia.  Low magnesium levels in the body.  Sweating a  lot. What are the signs or symptoms? Symptoms of this condition include:  Weakness.  Constipation.  Fatigue.  Muscle cramps.  Mental confusion.  Skipped heartbeats or irregular heartbeat (palpitations).  Tingling or numbness. How is this diagnosed? This condition is diagnosed with a blood test. How is this treated? This condition may be treated by:  Taking potassium supplements by mouth.  Adjusting the medicines that you take.  Eating more foods that contain a lot of potassium. If your potassium level is very low, you may need to get potassium through an IV and be monitored in the hospital. Follow these instructions at home:   Take over-the-counter and prescription medicines only as told by your health care provider. This includes vitamins and supplements.  Eat a healthy diet. A healthy diet includes fresh fruits and vegetables, whole grains, healthy fats, and lean proteins.  If instructed, eat more foods that contain a lot of potassium. This includes: ? Nuts, such as peanuts and pistachios. ? Seeds, such as sunflower seeds and pumpkin seeds. ? Peas, lentils, and lima beans. ? Whole grain and bran cereals and breads. ? Fresh fruits and vegetables, such as apricots, avocado, bananas, cantaloupe, kiwi, oranges, tomatoes, asparagus, and potatoes. ? Orange juice. ? Tomato juice. ? Red meats. ? Yogurt.  Keep all follow-up visits as told by your health care provider. This is important. Contact a health care provider if you:  Have weakness that gets worse.  Feel your heart pounding or racing.  Vomit.  Have diarrhea.  Have diabetes (diabetes mellitus) and you have trouble keeping your blood sugar (glucose) in your target range. Get help right away if you:  Have chest pain.  Have shortness of breath.  Have vomiting or diarrhea that lasts for more than 2 days.  Faint. Summary  Hypokalemia means that the amount of potassium in the blood is lower than  normal.  This condition is diagnosed with a blood test.  Hypokalemia may be treated by taking potassium supplements, adjusting the medicines that you take, or eating more foods that are high in potassium.  If your potassium level is very  low, you may need to get potassium through an IV and be monitored in the hospital. This information is not intended to replace advice given to you by your health care provider. Make sure you discuss any questions you have with your health care provider. Document Revised: 03/10/2018 Document Reviewed: 03/10/2018 Elsevier Patient Education  [place].
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
## 2019-12-22 LAB — COMPREHENSIVE METABOLIC PANEL
ALT: 57 U/L — ABNORMAL HIGH (ref 0–35)
AST: 42 U/L — ABNORMAL HIGH (ref 0–37)
Albumin: 4.5 g/dL (ref 3.5–5.2)
Alkaline Phosphatase: 77 U/L (ref 39–117)
BUN: 11 mg/dL (ref 6–23)
CO2: 27 mEq/L (ref 19–32)
Calcium: 9.7 mg/dL (ref 8.4–10.5)
Chloride: 103 mEq/L (ref 96–112)
Creatinine, Ser: 0.83 mg/dL (ref 0.40–1.20)
GFR: 89.57 mL/min (ref >60.00)
Glucose, Bld: 87 mg/dL (ref 70–99)
Potassium: 3.4 mEq/L — ABNORMAL LOW (ref 3.5–5.1)
Sodium: 140 mEq/L (ref 135–145)
Total Bilirubin: 0.7 mg/dL (ref 0.2–1.2)
Total Protein: 8.2 g/dL (ref 6.0–8.3)

## 2019-12-22 LAB — HEMOGLOBIN A1C: Hgb A1c MFr Bld: 6 % (ref 4.6–6.5)

## 2019-12-23 ENCOUNTER — Encounter: Payer: Self-pay | Admitting: Internal Medicine

## 2019-12-23 DIAGNOSIS — H538 Other visual disturbances: Secondary | ICD-10-CM | POA: Diagnosis not present

## 2019-12-23 DIAGNOSIS — H5319 Other subjective visual disturbances: Secondary | ICD-10-CM | POA: Diagnosis not present

## 2019-12-23 DIAGNOSIS — G43109 Migraine with aura, not intractable, without status migrainosus: Secondary | ICD-10-CM | POA: Diagnosis not present

## 2019-12-23 DIAGNOSIS — H1013 Acute atopic conjunctivitis, bilateral: Secondary | ICD-10-CM | POA: Diagnosis not present

## 2019-12-23 DIAGNOSIS — R7303 Prediabetes: Secondary | ICD-10-CM | POA: Insufficient documentation

## 2020-01-12 ENCOUNTER — Other Ambulatory Visit: Payer: Self-pay | Admitting: Internal Medicine

## 2020-01-12 DIAGNOSIS — R600 Localized edema: Secondary | ICD-10-CM

## 2020-01-12 MED ORDER — FUROSEMIDE 20 MG PO TABS: 20.0000 mg | ORAL_TABLET | Freq: Every day | ORAL | 2 refills | Status: DC | PRN

## 2020-01-30 ENCOUNTER — Encounter: Payer: Self-pay | Admitting: Internal Medicine

## 2020-03-02 ENCOUNTER — Ambulatory Visit (INDEPENDENT_AMBULATORY_CARE_PROVIDER_SITE_OTHER): Payer: Medicare Other

## 2020-03-02 ENCOUNTER — Ambulatory Visit
Admission: EM | Admit: 2020-03-02 | Discharge: 2020-03-02 | Disposition: A | Discharge status: Home / Self Care | Payer: Medicare Other | Attending: Family Medicine | Admitting: Family Medicine

## 2020-03-02 ENCOUNTER — Other Ambulatory Visit: Payer: Self-pay

## 2020-03-02 ENCOUNTER — Encounter: Payer: Self-pay | Admitting: Emergency Medicine

## 2020-03-02 DIAGNOSIS — M2578 Osteophyte, vertebrae: Secondary | ICD-10-CM | POA: Diagnosis not present

## 2020-03-02 DIAGNOSIS — S161XXA Strain of muscle, fascia and tendon at neck level, initial encounter: Secondary | ICD-10-CM

## 2020-03-02 MED ORDER — CYCLOBENZAPRINE HCL 10 MG PO TABS: 10.0000 mg | ORAL_TABLET | Freq: Three times a day (TID) | ORAL | 0 refills | Status: DC | PRN

## 2020-03-02 NOTE — Discharge Instructions (Signed)
Rest, heat, over the counter pain medication/anti-inflammatories
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
## 2020-03-05 DIAGNOSIS — F331 Major depressive disorder, recurrent, moderate: Secondary | ICD-10-CM | POA: Diagnosis not present

## 2020-03-14 ENCOUNTER — Telehealth: Payer: Self-pay | Admitting: Internal Medicine

## 2020-03-14 NOTE — Telephone Encounter (Signed)
lmom to cb and schedule" [facility] said 2 months ago  "2nd attempt/ lvm to c/b/ 02/16/2020.Marland KitchenMarland Kitchenpwd" [facility] said 27 days ago  "Rejection Reason - Patient did not respond" [facility] said about 23 hours ago

## 2020-03-16 ENCOUNTER — Other Ambulatory Visit: Payer: Self-pay | Admitting: Internal Medicine

## 2020-03-16 DIAGNOSIS — I10 Essential (primary) hypertension: Secondary | ICD-10-CM

## 2020-03-16 MED ORDER — AMLODIPINE BESYLATE 10 MG PO TABS: 10.0000 mg | ORAL_TABLET | Freq: Every day | ORAL | 3 refills | Status: DC

## 2020-03-22 ENCOUNTER — Telehealth: Payer: Self-pay | Admitting: Internal Medicine

## 2020-03-22 ENCOUNTER — Encounter: Payer: Self-pay | Admitting: Internal Medicine

## 2020-03-22 ENCOUNTER — Other Ambulatory Visit: Payer: Self-pay

## 2020-03-22 ENCOUNTER — Ambulatory Visit (INDEPENDENT_AMBULATORY_CARE_PROVIDER_SITE_OTHER): Payer: BC Managed Care – PPO | Admitting: Internal Medicine

## 2020-03-22 VITALS — BP 122/84 | HR 64 | Temp 97.6°F | Ht 72.0 in | Wt 201.8 lb

## 2020-03-22 DIAGNOSIS — M542 Cervicalgia: Secondary | ICD-10-CM | POA: Diagnosis not present

## 2020-03-22 DIAGNOSIS — E663 Overweight: Secondary | ICD-10-CM

## 2020-03-22 DIAGNOSIS — M545 Low back pain, unspecified: Secondary | ICD-10-CM

## 2020-03-22 DIAGNOSIS — G8929 Other chronic pain: Secondary | ICD-10-CM

## 2020-03-22 DIAGNOSIS — M47814 Spondylosis without myelopathy or radiculopathy, thoracic region: Secondary | ICD-10-CM | POA: Diagnosis not present

## 2020-03-22 DIAGNOSIS — M1990 Unspecified osteoarthritis, unspecified site: Secondary | ICD-10-CM | POA: Diagnosis not present

## 2020-03-22 MED ORDER — CYCLOBENZAPRINE HCL 10 MG PO TABS: 10.0000 mg | ORAL_TABLET | Freq: Every evening | ORAL | 1 refills | Status: DC | PRN

## 2020-03-22 MED ORDER — TRAMADOL HCL 50 MG PO TABS: 50.0000 mg | ORAL_TABLET | Freq: Two times a day (BID) | ORAL | 0 refills | Status: AC | PRN

## 2020-03-22 MED ORDER — PREDNISONE 20 MG PO TABS: 20.0000 mg | ORAL_TABLET | Freq: Every day | ORAL | 0 refills | Status: DC

## 2020-03-22 NOTE — Telephone Encounter (Signed)
No answer, no voicemail.

## 2020-03-22 NOTE — Telephone Encounter (Signed)
I am not taking new patients at this time Wilson is
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
## 2020-03-22 NOTE — Telephone Encounter (Signed)
Pt wanted to know if Dr.Tracy would take her husband as a new patient

## 2020-03-22 NOTE — Telephone Encounter (Signed)
Please advise 

## 2020-03-22 NOTE — Patient Instructions (Addendum)
voltaren gel  Tylenol as needed  Flexeril  heat  Let me know if PT helping by 9/6 or 9/8 if not send for injections Dr. Margette Fast for epidural injection into neck   Epidural Steroid Injection Patient Information  Description: The epidural space surrounds the nerves as they exit the spinal cord.  In some patients, the nerves can be compressed and inflamed by a bulging disc or a tight spinal canal (spinal stenosis).  By injecting steroids into the epidural space, we can bring irritated nerves into direct contact with a potentially helpful medication.  These steroids act directly on the irritated nerves and can reduce swelling and inflammation which often leads to decreased pain.  Epidural steroids may be injected anywhere along the spine and from the neck to the low back depending upon the location of your pain.   After numbing the skin with local anesthetic (like Novocaine), a small needle is passed into the epidural space slowly.  You may experience a sensation of pressure while this is being done.  The entire block usually last less than 10 minutes.  Conditions which may be treated by epidural steroids:   Low back and leg pain  Neck and arm pain  Spinal stenosis  Post-laminectomy syndrome  Herpes zoster (shingles) pain  Pain from compression fractures  Preparation for the injection:  1. Do not eat any solid food or dairy products within 8 hours of your appointment.  2. You may drink clear liquids up to 3 hours before appointment.  Clear liquids include water, black coffee, juice or soda.  No milk or cream please. 3. You may take your regular medication, including pain medications, with a sip of water before your appointment  Diabetics should hold regular insulin (if taken separately) and take 1/2 normal NPH dos the morning of the procedure.  Carry some sugar containing items with you to your appointment. 4. A driver must accompany you and be prepared to drive you home after your  procedure.  5. Bring all your current medications with your. 6. An IV may be inserted and sedation may be given at the discretion of the physician.   7. A blood pressure cuff, EKG and other monitors will often be applied during the procedure.  Some patients may need to have extra oxygen administered for a short period. 8. You will be asked to provide medical information, including your allergies, prior to the procedure.  We must know immediately if you are taking blood thinners (like Coumadin/Warfarin)  Or if you are allergic to IV iodine contrast (dye). We must know if you could possible be pregnant.  Possible side-effects:  Bleeding from needle site  Infection (rare, may require surgery)  Nerve injury (rare)  Numbness & tingling (temporary)  Difficulty urinating (rare, temporary)  Spinal headache ( a headache worse with upright posture)  Light -headedness (temporary)  Pain at injection site (several days)  Decreased blood pressure (temporary)  Weakness in arm/leg (temporary)  Pressure sensation in back/neck (temporary)  Call if you experience:  Fever/chills associated with headache or increased back/neck pain.  Headache worsened by an upright position.  New onset weakness or numbness of an extremity below the injection site  Hives or difficulty breathing (go to the emergency room)  Inflammation or drainage at the infection site  Severe back/neck pain  Any new symptoms which are concerning to you  Please note:  Although the local anesthetic injected can often make your back or neck feel good for several hours after  the injection, the pain will likely return.  It takes 3-7 days for steroids to work in the epidural space.  You may not notice any pain relief for at least that one week.  If effective, we will often do a series of three injections spaced 3-6 weeks apart to maximally decrease your pain.  After the initial series, we generally will wait several months  before considering a repeat injection of the same type.  If you have any questions, please call [phone] [facility]

## 2020-04-02 DIAGNOSIS — M5412 Radiculopathy, cervical region: Secondary | ICD-10-CM | POA: Diagnosis not present

## 2020-04-02 DIAGNOSIS — R519 Headache, unspecified: Secondary | ICD-10-CM | POA: Diagnosis not present

## 2020-04-02 DIAGNOSIS — S161XXA Strain of muscle, fascia and tendon at neck level, initial encounter: Secondary | ICD-10-CM | POA: Diagnosis not present

## 2020-04-02 DIAGNOSIS — M9901 Segmental and somatic dysfunction of cervical region: Secondary | ICD-10-CM | POA: Diagnosis not present

## 2020-04-03 DIAGNOSIS — M5412 Radiculopathy, cervical region: Secondary | ICD-10-CM | POA: Diagnosis not present

## 2020-04-03 DIAGNOSIS — M9901 Segmental and somatic dysfunction of cervical region: Secondary | ICD-10-CM | POA: Diagnosis not present

## 2020-04-03 DIAGNOSIS — R519 Headache, unspecified: Secondary | ICD-10-CM | POA: Diagnosis not present

## 2020-04-03 DIAGNOSIS — S161XXA Strain of muscle, fascia and tendon at neck level, initial encounter: Secondary | ICD-10-CM | POA: Diagnosis not present

## 2020-04-04 DIAGNOSIS — R519 Headache, unspecified: Secondary | ICD-10-CM | POA: Diagnosis not present

## 2020-04-04 DIAGNOSIS — M5412 Radiculopathy, cervical region: Secondary | ICD-10-CM | POA: Diagnosis not present

## 2020-04-04 DIAGNOSIS — M9901 Segmental and somatic dysfunction of cervical region: Secondary | ICD-10-CM | POA: Diagnosis not present

## 2020-04-04 DIAGNOSIS — S161XXA Strain of muscle, fascia and tendon at neck level, initial encounter: Secondary | ICD-10-CM | POA: Diagnosis not present

## 2020-04-05 DIAGNOSIS — M5412 Radiculopathy, cervical region: Secondary | ICD-10-CM | POA: Diagnosis not present

## 2020-04-05 DIAGNOSIS — R519 Headache, unspecified: Secondary | ICD-10-CM | POA: Diagnosis not present

## 2020-04-05 DIAGNOSIS — S161XXA Strain of muscle, fascia and tendon at neck level, initial encounter: Secondary | ICD-10-CM | POA: Diagnosis not present

## 2020-04-05 DIAGNOSIS — M9901 Segmental and somatic dysfunction of cervical region: Secondary | ICD-10-CM | POA: Diagnosis not present

## 2020-04-05 NOTE — Telephone Encounter (Signed)
Patient informed and verbalized understanding.  Would like to know if Dr Caryl Bis is taking new patients?

## 2020-04-05 NOTE — Telephone Encounter (Signed)
I called the patients wife and informed her that the provider will take her husband on as a new patient, he was scheduled to see Dr. Caryl Bis in October.  Donni Oglesby,cma

## 2020-04-05 NOTE — Telephone Encounter (Signed)
Yes - that would be fine

## 2020-04-05 NOTE — Telephone Encounter (Signed)
Patient sees Dr. Aundra Dubin -Allene Pyo and her husband needs a primary care physician and he has no chronic illnesses its mainly  for physicals once a year.  He wants female provider.  Prentiss Hammett,cma

## 2020-04-06 DIAGNOSIS — S161XXA Strain of muscle, fascia and tendon at neck level, initial encounter: Secondary | ICD-10-CM | POA: Diagnosis not present

## 2020-04-06 DIAGNOSIS — M5412 Radiculopathy, cervical region: Secondary | ICD-10-CM | POA: Diagnosis not present

## 2020-04-06 DIAGNOSIS — R519 Headache, unspecified: Secondary | ICD-10-CM | POA: Diagnosis not present

## 2020-04-06 DIAGNOSIS — M9901 Segmental and somatic dysfunction of cervical region: Secondary | ICD-10-CM | POA: Diagnosis not present

## 2020-04-10 DIAGNOSIS — M9901 Segmental and somatic dysfunction of cervical region: Secondary | ICD-10-CM | POA: Diagnosis not present

## 2020-04-10 DIAGNOSIS — M5412 Radiculopathy, cervical region: Secondary | ICD-10-CM | POA: Diagnosis not present

## 2020-04-10 DIAGNOSIS — R519 Headache, unspecified: Secondary | ICD-10-CM | POA: Diagnosis not present

## 2020-04-10 DIAGNOSIS — S161XXA Strain of muscle, fascia and tendon at neck level, initial encounter: Secondary | ICD-10-CM | POA: Diagnosis not present

## 2020-04-11 DIAGNOSIS — M9901 Segmental and somatic dysfunction of cervical region: Secondary | ICD-10-CM | POA: Diagnosis not present

## 2020-04-11 DIAGNOSIS — S161XXA Strain of muscle, fascia and tendon at neck level, initial encounter: Secondary | ICD-10-CM | POA: Diagnosis not present

## 2020-04-11 DIAGNOSIS — R519 Headache, unspecified: Secondary | ICD-10-CM | POA: Diagnosis not present

## 2020-04-11 DIAGNOSIS — M5412 Radiculopathy, cervical region: Secondary | ICD-10-CM | POA: Diagnosis not present

## 2020-04-12 DIAGNOSIS — R519 Headache, unspecified: Secondary | ICD-10-CM | POA: Diagnosis not present

## 2020-04-12 DIAGNOSIS — M5412 Radiculopathy, cervical region: Secondary | ICD-10-CM | POA: Diagnosis not present

## 2020-04-12 DIAGNOSIS — M9901 Segmental and somatic dysfunction of cervical region: Secondary | ICD-10-CM | POA: Diagnosis not present

## 2020-04-12 DIAGNOSIS — S161XXA Strain of muscle, fascia and tendon at neck level, initial encounter: Secondary | ICD-10-CM | POA: Diagnosis not present

## 2020-04-17 DIAGNOSIS — R519 Headache, unspecified: Secondary | ICD-10-CM | POA: Diagnosis not present

## 2020-04-17 DIAGNOSIS — M9901 Segmental and somatic dysfunction of cervical region: Secondary | ICD-10-CM | POA: Diagnosis not present

## 2020-04-17 DIAGNOSIS — M5412 Radiculopathy, cervical region: Secondary | ICD-10-CM | POA: Diagnosis not present

## 2020-04-17 DIAGNOSIS — S161XXA Strain of muscle, fascia and tendon at neck level, initial encounter: Secondary | ICD-10-CM | POA: Diagnosis not present

## 2020-04-18 DIAGNOSIS — M9901 Segmental and somatic dysfunction of cervical region: Secondary | ICD-10-CM | POA: Diagnosis not present

## 2020-04-18 DIAGNOSIS — R519 Headache, unspecified: Secondary | ICD-10-CM | POA: Diagnosis not present

## 2020-04-18 DIAGNOSIS — M5412 Radiculopathy, cervical region: Secondary | ICD-10-CM | POA: Diagnosis not present

## 2020-04-18 DIAGNOSIS — S161XXA Strain of muscle, fascia and tendon at neck level, initial encounter: Secondary | ICD-10-CM | POA: Diagnosis not present

## 2020-04-19 DIAGNOSIS — R519 Headache, unspecified: Secondary | ICD-10-CM | POA: Diagnosis not present

## 2020-04-19 DIAGNOSIS — M9901 Segmental and somatic dysfunction of cervical region: Secondary | ICD-10-CM | POA: Diagnosis not present

## 2020-04-19 DIAGNOSIS — M5412 Radiculopathy, cervical region: Secondary | ICD-10-CM | POA: Diagnosis not present

## 2020-04-19 DIAGNOSIS — S161XXA Strain of muscle, fascia and tendon at neck level, initial encounter: Secondary | ICD-10-CM | POA: Diagnosis not present

## 2020-04-21 DIAGNOSIS — S161XXA Strain of muscle, fascia and tendon at neck level, initial encounter: Secondary | ICD-10-CM | POA: Diagnosis not present

## 2020-04-21 DIAGNOSIS — M5412 Radiculopathy, cervical region: Secondary | ICD-10-CM | POA: Diagnosis not present

## 2020-04-21 DIAGNOSIS — R519 Headache, unspecified: Secondary | ICD-10-CM | POA: Diagnosis not present

## 2020-04-21 DIAGNOSIS — M9901 Segmental and somatic dysfunction of cervical region: Secondary | ICD-10-CM | POA: Diagnosis not present

## 2020-04-23 DIAGNOSIS — S161XXA Strain of muscle, fascia and tendon at neck level, initial encounter: Secondary | ICD-10-CM | POA: Diagnosis not present

## 2020-04-23 DIAGNOSIS — M5412 Radiculopathy, cervical region: Secondary | ICD-10-CM | POA: Diagnosis not present

## 2020-04-23 DIAGNOSIS — R519 Headache, unspecified: Secondary | ICD-10-CM | POA: Diagnosis not present

## 2020-04-23 DIAGNOSIS — M9901 Segmental and somatic dysfunction of cervical region: Secondary | ICD-10-CM | POA: Diagnosis not present

## 2020-04-24 DIAGNOSIS — M7061 Trochanteric bursitis, right hip: Secondary | ICD-10-CM | POA: Diagnosis not present

## 2020-04-24 DIAGNOSIS — M13851 Other specified arthritis, right hip: Secondary | ICD-10-CM | POA: Diagnosis not present

## 2020-04-24 DIAGNOSIS — M5416 Radiculopathy, lumbar region: Secondary | ICD-10-CM | POA: Diagnosis not present

## 2020-04-25 DIAGNOSIS — R519 Headache, unspecified: Secondary | ICD-10-CM | POA: Diagnosis not present

## 2020-04-25 DIAGNOSIS — M5412 Radiculopathy, cervical region: Secondary | ICD-10-CM | POA: Diagnosis not present

## 2020-04-25 DIAGNOSIS — M9901 Segmental and somatic dysfunction of cervical region: Secondary | ICD-10-CM | POA: Diagnosis not present

## 2020-04-25 DIAGNOSIS — S161XXA Strain of muscle, fascia and tendon at neck level, initial encounter: Secondary | ICD-10-CM | POA: Diagnosis not present

## 2020-04-26 DIAGNOSIS — M9901 Segmental and somatic dysfunction of cervical region: Secondary | ICD-10-CM | POA: Diagnosis not present

## 2020-04-26 DIAGNOSIS — M5412 Radiculopathy, cervical region: Secondary | ICD-10-CM | POA: Diagnosis not present

## 2020-04-26 DIAGNOSIS — R519 Headache, unspecified: Secondary | ICD-10-CM | POA: Diagnosis not present

## 2020-04-26 DIAGNOSIS — S161XXA Strain of muscle, fascia and tendon at neck level, initial encounter: Secondary | ICD-10-CM | POA: Diagnosis not present

## 2020-05-01 DIAGNOSIS — M5412 Radiculopathy, cervical region: Secondary | ICD-10-CM | POA: Diagnosis not present

## 2020-05-01 DIAGNOSIS — S161XXA Strain of muscle, fascia and tendon at neck level, initial encounter: Secondary | ICD-10-CM | POA: Diagnosis not present

## 2020-05-01 DIAGNOSIS — R519 Headache, unspecified: Secondary | ICD-10-CM | POA: Diagnosis not present

## 2020-05-01 DIAGNOSIS — M9901 Segmental and somatic dysfunction of cervical region: Secondary | ICD-10-CM | POA: Diagnosis not present

## 2020-05-03 ENCOUNTER — Other Ambulatory Visit: Payer: Self-pay

## 2020-05-03 ENCOUNTER — Ambulatory Visit (INDEPENDENT_AMBULATORY_CARE_PROVIDER_SITE_OTHER): Payer: BC Managed Care – PPO | Admitting: Internal Medicine

## 2020-05-03 ENCOUNTER — Encounter: Payer: Self-pay | Admitting: Internal Medicine

## 2020-05-03 VITALS — BP 140/90 | HR 64 | Temp 98.3°F | Ht 72.0 in | Wt 203.8 lb

## 2020-05-03 DIAGNOSIS — M9901 Segmental and somatic dysfunction of cervical region: Secondary | ICD-10-CM | POA: Diagnosis not present

## 2020-05-03 DIAGNOSIS — R202 Paresthesia of skin: Secondary | ICD-10-CM

## 2020-05-03 DIAGNOSIS — M5416 Radiculopathy, lumbar region: Secondary | ICD-10-CM

## 2020-05-03 DIAGNOSIS — M79602 Pain in left arm: Secondary | ICD-10-CM

## 2020-05-03 DIAGNOSIS — S161XXA Strain of muscle, fascia and tendon at neck level, initial encounter: Secondary | ICD-10-CM | POA: Diagnosis not present

## 2020-05-03 DIAGNOSIS — F40240 Claustrophobia: Secondary | ICD-10-CM | POA: Diagnosis not present

## 2020-05-03 DIAGNOSIS — R519 Headache, unspecified: Secondary | ICD-10-CM | POA: Diagnosis not present

## 2020-05-03 DIAGNOSIS — L720 Epidermal cyst: Secondary | ICD-10-CM

## 2020-05-03 DIAGNOSIS — R159 Full incontinence of feces: Secondary | ICD-10-CM | POA: Diagnosis not present

## 2020-05-03 DIAGNOSIS — M79601 Pain in right arm: Secondary | ICD-10-CM

## 2020-05-03 DIAGNOSIS — M5412 Radiculopathy, cervical region: Secondary | ICD-10-CM | POA: Diagnosis not present

## 2020-05-03 MED ORDER — DIAZEPAM 5 MG PO TABS: 5.0000 mg | ORAL_TABLET | Freq: Once | ORAL | 1 refills | Status: DC | PRN

## 2020-05-03 MED ORDER — METHYLPREDNISOLONE ACETATE 40 MG/ML IJ SUSP
40.0000 mg | Freq: Once | INTRAMUSCULAR | Status: AC
  Administered 2020-05-03: 40 mg via INTRAMUSCULAR
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
## 2020-05-03 NOTE — Patient Instructions (Signed)
Monitor BP goal <130/<80 likely high due to pain   Thoracic Strain Rehab Ask your health care provider which exercises are safe for you. Do exercises exactly as told by your health care provider and adjust them as directed. It is normal to feel mild stretching, pulling, tightness, or discomfort as you do these exercises. Stop right away if you feel sudden pain or your pain gets worse. Do not begin these exercises until told by your health care provider. Stretching and range-of-motion exercise This exercise warms up your muscles and joints and improves the movement and flexibility of your back and shoulders. This exercise also helps to relieve pain. Chest and spine stretch  1. Lie down on your back on a firm surface. 2. Roll a towel or a small blanket so it is about 4 inches (10 cm) in diameter. 3. Put the towel lengthwise under the middle of your back so it is under your spine, but not under your shoulder blades. 4. Put your hands behind your head and let your elbows fall to your sides. This will increase your stretch. 5. Take a deep breath (inhale). 6. Hold for __________ seconds. 7. Relax after you breathe out (exhale). Repeat __________ times. Complete this exercise __________ times a day. Strengthening exercises These exercises build strength and endurance in your back and your shoulder blade muscles. Endurance is the ability to use your muscles for a long time, even after they get tired. Alternating arm and leg raises  1. Get on your hands and knees on a firm surface. If you are on a hard floor, you may want to use padding, such as an exercise mat, to cushion your knees. 2. Line up your arms and legs. Your hands should be directly below your shoulders, and your knees should be directly below your hips. 3. Lift your left leg behind you. At the same time, raise your right arm and straighten it in front of you. ? Do not lift your leg higher than your hip. ? Do not lift your arm higher than  your shoulder. ? Keep your abdominal and back muscles tight. ? Keep your hips facing the ground. ? Do not arch your back. ? Keep your balance carefully, and do not hold your breath. 4. Hold for __________ seconds. 5. Slowly return to the starting position and repeat with your right leg and your left arm. Repeat __________ times. Complete this exercise __________ times a day. Straight arm rows This exercise is also called shoulder extension exercise. 1. Stand with your feet shoulder width apart. 2. Secure an exercise band to a stable object in front of you so the band is at or above shoulder height. 3. Hold one end of the exercise band in each hand. 4. Straighten your elbows and lift your hands up to shoulder height. 5. Step back, away from the secured end of the exercise band, until the band stretches. 6. Squeeze your shoulder blades together and pull your hands down to the sides of your thighs. Stop when your hands are straight down by your sides. This is shoulder extension. Do not let your hands go behind your body. 7. Hold for __________ seconds. 8. Slowly return to the starting position. Repeat __________ times. Complete this exercise __________ times a day. Prone shoulder external rotation 1. Lie on your abdomen on a firm bed so your left / right forearm hangs over the edge of the bed and your upper arm is on the bed, straight out from your body. This  is the prone position. ? Your elbow should be bent. ? Your palm should be facing your feet. 2. If instructed, hold a __________ weight in your hand. 3. Squeeze your shoulder blade toward the middle of your back. Do not let your shoulder lift toward your ear. 4. Keep your elbow bent in a 90-degree angle (right angle) while you slowly move your forearm up toward the ceiling. Move your forearm up to the height of the bed, toward your head. This is external rotation. ? Your upper arm should not move. ? At the top of the movement, your palm  should face the floor. 5. Hold for __________ seconds. 6. Slowly return to the starting position and relax your muscles. Repeat __________ times. Complete this exercise __________ times a day. Rowing scapular retraction This is an exercise in which the shoulder blades (scapulae) are pulled toward each other (retraction). 1. Sit in a stable chair without armrests, or stand up. 2. Secure an exercise band to a stable object in front of you so the band is at shoulder height. 3. Hold one end of the exercise band in each hand. Your palms should face down. 4. Bring your arms out straight in front of you. 5. Step back, away from the secured end of the exercise band, until the band stretches. 6. Pull the band backward. As you do this, bend your elbows and squeeze your shoulder blades together, but avoid letting the rest of your body move. Do not shrug your shoulders upward while you do this. 7. Stop when your elbows are at your sides or slightly behind your body. 8. Hold for __________ seconds. 9. Slowly straighten your arms to return to the starting position. Repeat __________ times. Complete this exercise __________ times a day. Posture and body mechanics Good posture and healthy body mechanics can help to relieve stress in your body's tissues and joints. Body mechanics refers to the movements and positions of your body while you do your daily activities. Posture is part of body mechanics. Good posture means:  Your spine is in its natural S-curve position (neutral).  Your shoulders are pulled back slightly.  Your head is not tipped forward. Follow these guidelines to improve your posture and body mechanics in your everyday activities. Standing   When standing, keep your spine neutral and your feet about hip width apart. Keep a slight bend in your knees. Your ears, shoulders, and hips should line up with each other.  When you do a task in which you lean forward while standing in one place for a  long time, place one foot up on a stable object that is 2-4 inches (5-10 cm) high, such as a footstool. This helps keep your spine neutral. Sitting   When sitting, keep your spine neutral and keep your feet flat on the floor. Use a footrest, if necessary, and keep your thighs parallel to the floor. Avoid rounding your shoulders, and avoid tilting your head forward.  When working at a desk or a computer, keep your desk at a height where your hands are slightly lower than your elbows. Slide your chair under your desk so you are close enough to maintain good posture.  When working at a computer, place your monitor at a height where you are looking straight ahead and you do not have to tilt your head forward or downward to look at the screen. Resting When lying down and resting, avoid positions that are most painful for you.  If you have pain  with activities such as sitting, bending, stooping, or squatting (flexion-basedactivities), lie in a position in which your body does not bend very much. For example, avoid curling up on your side with your arms and knees near your chest (fetal position).  If you have pain with activities such as standing for a long time or reaching with your arms (extension-basedactivities), lie with your spine in a neutral position and bend your knees slightly. Try the following positions: ? Lie on your side with a pillow between your knees. ? Lie on your back with a pillow under your knees.  Lifting   When lifting objects, keep your feet at least shoulder width apart and tighten your abdominal muscles.  Bend your knees and hips and keep your spine neutral. It is important to lift using the strength of your legs, not your back. Do not lock your knees straight out.  Always ask for help to lift heavy or awkward objects. This information is not intended to replace advice given to you by your health care provider. Make sure you discuss any questions you have with your health  care provider. Document Revised: 11/19/2018 Document Reviewed: 09/06/2018 Elsevier Patient Education  [place] or Strain Rehab Ask your health care provider which exercises are safe for you. Do exercises exactly as told by your health care provider and adjust them as directed. It is normal to feel mild stretching, pulling, tightness, or discomfort as you do these exercises. Stop right away if you feel sudden pain or your pain gets worse. Do not begin these exercises until told by your health care provider. Stretching and range-of-motion exercises These exercises warm up your muscles and joints and improve the movement and flexibility of your back. These exercises also help to relieve pain, numbness, and tingling. Lumbar rotation  8. Lie on your back on a firm surface and bend your knees. 9. Straighten your arms out to your sides so each arm forms a 90-degree angle (right angle) with a side of your body. 10. Slowly move (rotate) both of your knees to one side of your body until you feel a stretch in your lower back (lumbar). Try not to let your shoulders lift off the floor. 11. Hold this position for __________ seconds. 12. Tense your abdominal muscles and slowly move your knees back to the starting position. 13. Repeat this exercise on the other side of your body. Repeat __________ times. Complete this exercise __________ times a day. Single knee to chest  6. Lie on your back on a firm surface with both legs straight. 7. Bend one of your knees. Use your hands to move your knee up toward your chest until you feel a gentle stretch in your lower back and buttock. ? Hold your leg in this position by holding on to the front of your knee. ? Keep your other leg as straight as possible. 8. Hold this position for __________ seconds. 9. Slowly return to the starting position. 10. Repeat with your other leg. Repeat __________ times. Complete this exercise __________ times a  day. Prone extension on elbows  9. Lie on your abdomen on a firm surface (prone position). 10. Prop yourself up on your elbows. 11. Use your arms to help lift your chest up until you feel a gentle stretch in your abdomen and your lower back. ? This will place some of your body weight on your elbows. If this is uncomfortable, try stacking pillows under your chest. ? Your  hips should stay down, against the surface that you are lying on. Keep your hip and back muscles relaxed. 12. Hold this position for __________ seconds. 13. Slowly relax your upper body and return to the starting position. Repeat __________ times. Complete this exercise __________ times a day. Strengthening exercises These exercises build strength and endurance in your back. Endurance is the ability to use your muscles for a long time, even after they get tired. Pelvic tilt This exercise strengthens the muscles that lie deep in the abdomen. 7. Lie on your back on a firm surface. Bend your knees and keep your feet flat on the floor. 8. Tense your abdominal muscles. Tip your pelvis up toward the ceiling and flatten your lower back into the floor. ? To help with this exercise, you may place a small towel under your lower back and try to push your back into the towel. 9. Hold this position for __________ seconds. 10. Let your muscles relax completely before you repeat this exercise. Repeat __________ times. Complete this exercise __________ times a day. Alternating arm and leg raises  10. Get on your hands and knees on a firm surface. If you are on a hard floor, you may want to use padding, such as an exercise mat, to cushion your knees. 11. Line up your arms and legs. Your hands should be directly below your shoulders, and your knees should be directly below your hips. 12. Lift your left leg behind you. At the same time, raise your right arm and straighten it in front of you. ? Do not lift your leg higher than your hip. ? Do  not lift your arm higher than your shoulder. ? Keep your abdominal and back muscles tight. ? Keep your hips facing the ground. ? Do not arch your back. ? Keep your balance carefully, and do not hold your breath. 13. Hold this position for __________ seconds. 14. Slowly return to the starting position. 15. Repeat with your right leg and your left arm. Repeat __________ times. Complete this exercise __________ times a day. Abdominal set with straight leg raise  1. Lie on your back on a firm surface. 2. Bend one of your knees and keep your other leg straight. 3. Tense your abdominal muscles and lift your straight leg up, 4-6 inches (10-15 cm) off the ground. 4. Keep your abdominal muscles tight and hold this position for __________ seconds. ? Do not hold your breath. ? Do not arch your back. Keep it flat against the ground. 5. Keep your abdominal muscles tense as you slowly lower your leg back to the starting position. 6. Repeat with your other leg. Repeat __________ times. Complete this exercise __________ times a day. Single leg lower with bent knees 1. Lie on your back on a firm surface. 2. Tense your abdominal muscles and lift your feet off the floor, one foot at a time, so your knees and hips are bent in 90-degree angles (right angles). ? Your knees should be over your hips and your lower legs should be parallel to the floor. 3. Keeping your abdominal muscles tense and your knee bent, slowly lower one of your legs so your toe touches the ground. 4. Lift your leg back up to return to the starting position. ? Do not hold your breath. ? Do not let your back arch. Keep your back flat against the ground. 5. Repeat with your other leg. Repeat __________ times. Complete this exercise __________ times a day. Posture and body mechanics  Good posture and healthy body mechanics can help to relieve stress in your body's tissues and joints. Body mechanics refers to the movements and positions of  your body while you do your daily activities. Posture is part of body mechanics. Good posture means:  Your spine is in its natural S-curve position (neutral).  Your shoulders are pulled back slightly.  Your head is not tipped forward. Follow these guidelines to improve your posture and body mechanics in your everyday activities. Standing   When standing, keep your spine neutral and your feet about hip width apart. Keep a slight bend in your knees. Your ears, shoulders, and hips should line up.  When you do a task in which you stand in one place for a long time, place one foot up on a stable object that is 2-4 inches (5-10 cm) high, such as a footstool. This helps keep your spine neutral. Sitting   When sitting, keep your spine neutral and keep your feet flat on the floor. Use a footrest, if necessary, and keep your thighs parallel to the floor. Avoid rounding your shoulders, and avoid tilting your head forward.  When working at a desk or a computer, keep your desk at a height where your hands are slightly lower than your elbows. Slide your chair under your desk so you are close enough to maintain good posture.  When working at a computer, place your monitor at a height where you are looking straight ahead and you do not have to tilt your head forward or downward to look at the screen. Resting  When lying down and resting, avoid positions that are most painful for you.  If you have pain with activities such as sitting, bending, stooping, or squatting, lie in a position in which your body does not bend very much. For example, avoid curling up on your side with your arms and knees near your chest (fetal position).  If you have pain with activities such as standing for a long time or reaching with your arms, lie with your spine in a neutral position and bend your knees slightly. Try the following positions: ? Lying on your side with a pillow between your knees. ? Lying on your back with a  pillow under your knees. Lifting   When lifting objects, keep your feet at least shoulder width apart and tighten your abdominal muscles.  Bend your knees and hips and keep your spine neutral. It is important to lift using the strength of your legs, not your back. Do not lock your knees straight out.  Always ask for help to lift heavy or awkward objects. This information is not intended to replace advice given to you by your health care provider. Make sure you discuss any questions you have with your health care provider. Document Revised: 11/19/2018 Document Reviewed: 08/19/2018 Elsevier Patient Education  [place].  Back Exercises The following exercises strengthen the muscles that help to support the trunk and back. They also help to keep the lower back flexible. Doing these exercises can help to prevent back pain or lessen existing pain.  If you have back pain or discomfort, try doing these exercises 2-3 times each day or as told by your health care provider.  As your pain improves, do them once each day, but increase the number of times that you repeat the steps for each exercise (do more repetitions).  To prevent the recurrence of back pain, continue to do these exercises once each day or as  told by your health care provider. Do exercises exactly as told by your health care provider and adjust them as directed. It is normal to feel mild stretching, pulling, tightness, or discomfort as you do these exercises, but you should stop right away if you feel sudden pain or your pain gets worse. Exercises Single knee to chest Repeat these steps 3-5 times for each leg: 1. Lie on your back on a firm bed or the floor with your legs extended. 2. Bring one knee to your chest. Your other leg should stay extended and in contact with the floor. 3. Hold your knee in place by grabbing your knee or thigh with both hands and hold. 4. Pull on your knee until you feel a gentle stretch in your  lower back or buttocks. 5. Hold the stretch for 10-30 seconds. 6. Slowly release and straighten your leg. Pelvic tilt Repeat these steps 5-10 times: 1. Lie on your back on a firm bed or the floor with your legs extended. 2. Bend your knees so they are pointing toward the ceiling and your feet are flat on the floor. 3. Tighten your lower abdominal muscles to press your lower back against the floor. This motion will tilt your pelvis so your tailbone points up toward the ceiling instead of pointing to your feet or the floor. 4. With gentle tension and even breathing, hold this position for 5-10 seconds. Cat-cow Repeat these steps until your lower back becomes more flexible: 1. Get into a hands-and-knees position on a firm surface. Keep your hands under your shoulders, and keep your knees under your hips. You may place padding under your knees for comfort. 2. Let your head hang down toward your chest. Contract your abdominal muscles and point your tailbone toward the floor so your lower back becomes rounded like the back of a cat. 3. Hold this position for 5 seconds. 4. Slowly lift your head, let your abdominal muscles relax and point your tailbone up toward the ceiling so your back forms a sagging arch like the back of a cow. 5. Hold this position for 5 seconds.  Press-ups Repeat these steps 5-10 times: 1. Lie on your abdomen (face-down) on the floor. 2. Place your palms near your head, about shoulder-width apart. 3. Keeping your back as relaxed as possible and keeping your hips on the floor, slowly straighten your arms to raise the top half of your body and lift your shoulders. Do not use your back muscles to raise your upper torso. You may adjust the placement of your hands to make yourself more comfortable. 4. Hold this position for 5 seconds while you keep your back relaxed. 5. Slowly return to lying flat on the floor.  Bridges Repeat these steps 10 times: 1. Lie on your back on a firm  surface. 2. Bend your knees so they are pointing toward the ceiling and your feet are flat on the floor. Your arms should be flat at your sides, next to your body. 3. Tighten your buttocks muscles and lift your buttocks off the floor until your waist is at almost the same height as your knees. You should feel the muscles working in your buttocks and the back of your thighs. If you do not feel these muscles, slide your feet 1-2 inches farther away from your buttocks. 4. Hold this position for 3-5 seconds. 5. Slowly lower your hips to the starting position, and allow your buttocks muscles to relax completely. If this exercise is too easy, try  doing it with your arms crossed over your chest. Abdominal crunches Repeat these steps 5-10 times: 1. Lie on your back on a firm bed or the floor with your legs extended. 2. Bend your knees so they are pointing toward the ceiling and your feet are flat on the floor. 3. Cross your arms over your chest. 4. Tip your chin slightly toward your chest without bending your neck. 5. Tighten your abdominal muscles and slowly raise your trunk (torso) high enough to lift your shoulder blades a tiny bit off the floor. Avoid raising your torso higher than that because it can put too much stress on your low back and does not help to strengthen your abdominal muscles. 6. Slowly return to your starting position. Back lifts Repeat these steps 5-10 times: 1. Lie on your abdomen (face-down) with your arms at your sides, and rest your forehead on the floor. 2. Tighten the muscles in your legs and your buttocks. 3. Slowly lift your chest off the floor while you keep your hips pressed to the floor. Keep the back of your head in line with the curve in your back. Your eyes should be looking at the floor. 4. Hold this position for 3-5 seconds. 5. Slowly return to your starting position. Contact a health care provider if:  Your back pain or discomfort gets much worse when you do an  exercise.  Your worsening back pain or discomfort does not lessen within 2 hours after you exercise. If you have any of these problems, stop doing these exercises right away. Do not do them again unless your health care provider says that you can. Get help right away if:  You develop sudden, severe back pain. If this happens, stop doing the exercises right away. Do not do them again unless your health care provider says that you can. This information is not intended to replace advice given to you by your health care provider. Make sure you discuss any questions you have with your health care provider. Document Revised: 12/02/2018 Document Reviewed: 04/29/2018 Elsevier Patient Education  [place].

## 2020-05-11 ENCOUNTER — Telehealth: Payer: Self-pay | Admitting: Internal Medicine

## 2020-05-11 DIAGNOSIS — I10 Essential (primary) hypertension: Secondary | ICD-10-CM

## 2020-05-11 MED ORDER — AMLODIPINE BESYLATE 10 MG PO TABS: 10.0000 mg | ORAL_TABLET | Freq: Every day | ORAL | 3 refills | Status: DC

## 2020-05-11 NOTE — Telephone Encounter (Signed)
Patient is requesting a refill on her bp medication. Patient is out of her medication.

## 2020-05-14 ENCOUNTER — Telehealth: Payer: Self-pay | Admitting: Internal Medicine

## 2020-05-14 DIAGNOSIS — M9901 Segmental and somatic dysfunction of cervical region: Secondary | ICD-10-CM | POA: Diagnosis not present

## 2020-05-14 DIAGNOSIS — M5412 Radiculopathy, cervical region: Secondary | ICD-10-CM | POA: Diagnosis not present

## 2020-05-14 DIAGNOSIS — R519 Headache, unspecified: Secondary | ICD-10-CM | POA: Diagnosis not present

## 2020-05-14 DIAGNOSIS — S161XXA Strain of muscle, fascia and tendon at neck level, initial encounter: Secondary | ICD-10-CM | POA: Diagnosis not present

## 2020-05-14 NOTE — Telephone Encounter (Signed)
Call pt and verify only taking norvasc 10 mg for BP  Was on losartan/cozaar 25 mg daily  -I want to make sure is she or is she not taking this?  I believed she wanted to stop and for what reason?    [place]

## 2020-05-16 ENCOUNTER — Other Ambulatory Visit: Payer: Self-pay

## 2020-05-16 ENCOUNTER — Ambulatory Visit (INDEPENDENT_AMBULATORY_CARE_PROVIDER_SITE_OTHER): Payer: BC Managed Care – PPO | Admitting: Internal Medicine

## 2020-05-16 ENCOUNTER — Encounter: Payer: Self-pay | Admitting: Internal Medicine

## 2020-05-16 ENCOUNTER — Ambulatory Visit: Payer: BC Managed Care – PPO | Admitting: Internal Medicine

## 2020-05-16 VITALS — BP 142/108 | HR 78 | Temp 98.2°F | Ht 72.01 in | Wt 203.2 lb

## 2020-05-16 DIAGNOSIS — K5900 Constipation, unspecified: Secondary | ICD-10-CM

## 2020-05-16 DIAGNOSIS — R7303 Prediabetes: Secondary | ICD-10-CM

## 2020-05-16 DIAGNOSIS — Z23 Encounter for immunization: Secondary | ICD-10-CM

## 2020-05-16 DIAGNOSIS — I10 Essential (primary) hypertension: Secondary | ICD-10-CM | POA: Diagnosis not present

## 2020-05-16 DIAGNOSIS — K581 Irritable bowel syndrome with constipation: Secondary | ICD-10-CM

## 2020-05-16 DIAGNOSIS — M9901 Segmental and somatic dysfunction of cervical region: Secondary | ICD-10-CM | POA: Diagnosis not present

## 2020-05-16 DIAGNOSIS — R519 Headache, unspecified: Secondary | ICD-10-CM | POA: Diagnosis not present

## 2020-05-16 DIAGNOSIS — S161XXA Strain of muscle, fascia and tendon at neck level, initial encounter: Secondary | ICD-10-CM | POA: Diagnosis not present

## 2020-05-16 DIAGNOSIS — Z1389 Encounter for screening for other disorder: Secondary | ICD-10-CM

## 2020-05-16 DIAGNOSIS — Z1329 Encounter for screening for other suspected endocrine disorder: Secondary | ICD-10-CM

## 2020-05-16 DIAGNOSIS — M5412 Radiculopathy, cervical region: Secondary | ICD-10-CM | POA: Diagnosis not present

## 2020-05-16 MED ORDER — LOSARTAN POTASSIUM 25 MG PO TABS: 25.0000 mg | ORAL_TABLET | Freq: Every day | ORAL | 3 refills | Status: DC

## 2020-05-16 NOTE — Patient Instructions (Addendum)
Metamucil or benefiber  Warm prune juice, ginger tea Dr. Hardin Negus colon cleanse Kiwi fruit  Align, Cuturelle, Renew probiotics    covid pfizer due 06/20/20   Please get mammogram and bone density   Diet for Irritable Bowel Syndrome When you have irritable bowel syndrome (IBS), it is very important to eat the foods and follow the eating habits that are best for your condition. IBS may cause various symptoms such as pain in the abdomen, constipation, or diarrhea. Choosing the right foods can help to ease the discomfort from these symptoms. Work with your health care provider and diet and nutrition specialist (dietitian) to find the eating plan that will help to control your symptoms. What are tips for following this plan?      Keep a food diary. This will help you identify foods that cause symptoms. Write down: ? What you eat and when you eat it. ? What symptoms you have. ? When symptoms occur in relation to your meals, such as "pain in abdomen 2 hours after dinner."  Eat your meals slowly and in a relaxed setting.  Aim to eat 5-6 small meals per day. Do not skip meals.  Drink enough fluid to keep your urine pale yellow.  Ask your health care provider if you should take an over-the-counter probiotic to help restore healthy bacteria in your gut (digestive tract). ? Probiotics are foods that contain good bacteria and yeasts.  Your dietitian may have specific dietary recommendations for you based on your symptoms. He or she may recommend that you: ? Avoid foods that cause symptoms. Talk with your dietitian about other ways to get the same nutrients that are in those problem foods. ? Avoid foods with gluten. Gluten is a protein that is found in rye, wheat, and barley. ? Eat more foods that contain soluble fiber. Examples of foods with high soluble fiber include oats, seeds, and certain fruits and vegetables. Take a fiber supplement if directed by your dietitian. ? Reduce or avoid  certain foods called FODMAPs. These are foods that contain carbohydrates that are hard to digest. Ask your doctor which foods contain these carbohydrates. What foods are not recommended? The following are some foods and drinks that may make your symptoms worse:  Fatty foods, such as french fries.  Foods that contain gluten, such as pasta and cereal.  Dairy products, such as milk, cheese, and ice cream.  Chocolate.  Alcohol.  Products with caffeine, such as coffee.  Carbonated drinks, such as soda.  Foods that are high in FODMAPs. These include certain fruits and vegetables.  Products with sweeteners such as honey, high fructose corn syrup, sorbitol, and mannitol. The items listed above may not be a complete list of foods and beverages you should avoid. Contact a dietitian for more information. What foods are good sources of fiber? Your health care provider or dietitian may recommend that you eat more foods that contain fiber. Fiber can help to reduce constipation and other IBS symptoms. Add foods with fiber to your diet a little at a time so your body can get used to them. Too much fiber at one time might cause gas and swelling of your abdomen. The following are some foods that are good sources of fiber:  Berries, such as raspberries, strawberries, and blueberries.  Tomatoes.  Carrots.  Brown rice.  Oats.  Seeds, such as chia and pumpkin seeds. The items listed above may not be a complete list of recommended sources of fiber. Contact your dietitian for  more options. Where to find more information  International Foundation for Functional Gastrointestinal Disorders: www.iffgd.[employer] of Diabetes and Digestive and Kidney Diseases: [employer] Summary  When you have irritable bowel syndrome (IBS), it is very important to eat the foods and follow the eating habits that are best for your condition.  IBS may cause various symptoms such as pain in the  abdomen, constipation, or diarrhea.  Choosing the right foods can help to ease the discomfort that comes from symptoms.  Keep a food diary. This will help you identify foods that cause symptoms.  Your health care provider or diet and nutrition specialist (dietitian) may recommend that you eat more foods that contain fiber. This information is not intended to replace advice given to you by your health care provider. Make sure you discuss any questions you have with your health care provider. Document Revised: 11/17/2018 Document Reviewed: 03/31/2017 Elsevier Patient Education  [place].  Constipation, Adult Constipation is when a person has fewer bowel movements in a week than normal, has difficulty having a bowel movement, or has stools that are dry, hard, or larger than normal. Constipation may be caused by an underlying condition. It may become worse with age if a person takes certain medicines and does not take in enough fluids. Follow these instructions at home: Eating and drinking   Eat foods that have a lot of fiber, such as fresh fruits and vegetables, whole grains, and beans.  Limit foods that are high in fat, low in fiber, or overly processed, such as french fries, hamburgers, cookies, candies, and soda.  Drink enough fluid to keep your urine clear or pale yellow. General instructions  Exercise regularly or as told by your health care provider.  Go to the restroom when you have the urge to go. Do not hold it in.  Take over-the-counter and prescription medicines only as told by your health care provider. These include any fiber supplements.  Practice pelvic floor retraining exercises, such as deep breathing while relaxing the lower abdomen and pelvic floor relaxation during bowel movements.  Watch your condition for any changes.  Keep all follow-up visits as told by your health care provider. This is important. Contact a health care provider if:  You have pain  that gets worse.  You have a fever.  You do not have a bowel movement after 4 days.  You vomit.  You are not hungry.  You lose weight.  You are bleeding from the anus.  You have thin, pencil-like stools. Get help right away if:  You have a fever and your symptoms suddenly get worse.  You leak stool or have blood in your stool.  Your abdomen is bloated.  You have severe pain in your abdomen.  You feel dizzy or you faint. This information is not intended to replace advice given to you by your health care provider. Make sure you discuss any questions you have with your health care provider. Document Revised: 07/10/2017 Document Reviewed: 01/16/2016 Elsevier Patient Education  [place]  FODMAPs (fermentable oligosaccharides, disaccharides, monosaccharides, and polyols) are sugars that are hard for some people to digest. A low-FODMAP eating plan may help some people who have bowel (intestinal) diseases to manage their symptoms. This meal plan can be complicated to follow. Work with a diet and nutrition specialist (dietitian) to make a low-FODMAP eating plan that is right for you. A dietitian can make sure that you get enough nutrition from this  diet. What are tips for following this plan? Reading food labels  Check labels for hidden FODMAPs such as: ? High-fructose syrup. ? Honey. ? Agave. ? Natural fruit flavors. ? Onion or garlic powder.  Choose low-FODMAP foods that contain 3-4 grams of fiber per serving.  Check food labels for serving sizes. Eat only one serving at a time to make sure FODMAP levels stay low. Meal planning  Follow a low-FODMAP eating plan for up to 6 weeks, or as told by your health care provider or dietitian.  To follow the eating plan: 1. Eliminate high-FODMAP foods from your diet completely. 2. Gradually reintroduce high-FODMAP foods into your diet one at a time. Most people should wait a few days after  introducing one high-FODMAP food before they introduce the next high-FODMAP food. Your dietitian can recommend how quickly you may reintroduce foods. 3. Keep a daily record of what you eat and drink, and make note of any symptoms that you have after eating. 4. Review your daily record with a dietitian regularly. Your dietitian can help you identify which foods you can eat and which foods you should avoid. General tips  Drink enough fluid each day to keep your urine pale yellow.  Avoid processed foods. These often have added sugar and may be high in FODMAPs.  Avoid most dairy products, whole grains, and sweeteners.  Work with a dietitian to make sure you get enough fiber in your diet. Recommended foods Grains  Gluten-free grains, such as rice, oats, buckwheat, quinoa, corn, polenta, and millet. Gluten-free pasta, bread, or cereal. Rice noodles. Corn tortillas. Vegetables  Eggplant, zucchini, cucumber, peppers, green beans, Brussels sprouts, bean sprouts, lettuce, arugula, kale, Swiss chard, spinach, collard greens, bok choy, summer squash, potato, and tomato. Limited amounts of corn, carrot, and sweet potato. Green parts of scallions. Fruits  Bananas, oranges, lemons, limes, blueberries, raspberries, strawberries, grapes, cantaloupe, honeydew melon, kiwi, papaya, passion fruit, and pineapple. Limited amounts of dried cranberries, banana chips, and shredded coconut. Dairy  Lactose-free milk, yogurt, and kefir. Lactose-free cottage cheese and ice cream. Non-dairy milks, such as almond, coconut, hemp, and rice milk. Yogurts made of non-dairy milks. Limited amounts of goat cheese, brie, mozzarella, parmesan, swiss, and other hard cheeses. Meats and other protein foods  Unseasoned beef, pork, poultry, or fish. Eggs. Berniece Salines. Tofu (firm) and tempeh. Limited amounts of nuts and seeds, such as almonds, walnuts, [place] nuts, pecans, peanuts, pumpkin seeds, chia seeds, and sunflower seeds. Fats and  oils  Butter-free spreads. Vegetable oils, such as olive, canola, and sunflower oil. Seasoning and other foods  Artificial sweeteners with names that do not end in "ol" such as aspartame, saccharine, and stevia. Maple syrup, white table sugar, raw sugar, brown sugar, and molasses. Fresh basil, coriander, parsley, rosemary, and thyme. Beverages  Water and mineral water. Sugar-sweetened soft drinks. Small amounts of orange juice or cranberry juice. Black and green tea. Most dry wines. Coffee. This may not be a complete list of low-FODMAP foods. Talk with your dietitian for more information. Foods to avoid Grains  Wheat, including kamut, durum, and semolina. Barley and bulgur. Couscous. Wheat-based cereals. Wheat noodles, bread, crackers, and pastries. Vegetables  Chicory root, artichoke, asparagus, cabbage, snow peas, sugar snap peas, mushrooms, and cauliflower. Onions, garlic, leeks, and the white part of scallions. Fruits  Fresh, dried, and juiced forms of apple, pear, watermelon, peach, plum, cherries, apricots, blackberries, boysenberries, figs, nectarines, and mango. Avocado. Dairy  Milk, yogurt, ice cream, and soft cheese. Cream and sour cream.  Milk-based sauces. Custard. Meats and other protein foods  Fried or fatty meat. Sausage. Cashews and pistachios. Soybeans, baked beans, black beans, chickpeas, kidney beans, fava beans, navy beans, lentils, and split peas. Seasoning and other foods  Any sugar-free gum or candy. Foods that contain artificial sweeteners such as sorbitol, mannitol, isomalt, or xylitol. Foods that contain honey, high-fructose corn syrup, or agave. Bouillon, vegetable stock, beef stock, and chicken stock. Garlic and onion powder. Condiments made with onion, such as hummus, chutney, pickles, relish, salad dressing, and salsa. Tomato paste. Beverages  Chicory-based drinks. Coffee substitutes. Chamomile tea. Fennel tea. Sweet or fortified wines such as port or  sherry. Diet soft drinks made with isomalt, mannitol, maltitol, sorbitol, or xylitol. Apple, pear, and mango juice. Juices with high-fructose corn syrup. This may not be a complete list of high-FODMAP foods. Talk with your dietitian to discuss what dietary choices are best for you.  Summary  A low-FODMAP eating plan is a short-term diet that eliminates FODMAPs from your diet to help ease symptoms of certain bowel diseases.  The eating plan usually lasts up to 6 weeks. After that, high-FODMAP foods are restarted gradually, one at a time, so you can find out which may be causing symptoms.  A low-FODMAP eating plan can be complicated. It is best to work with a dietitian who has experience with this type of plan. This information is not intended to replace advice given to you by your health care provider. Make sure you discuss any questions you have with your health care provider. Document Revised: 07/10/2017 Document Reviewed: 03/24/2017 Elsevier Patient Education  [place].

## 2020-05-16 NOTE — Progress Notes (Signed)
Chief Complaint  Patient presents with  . Annual Exam    pt declined breast exam. C/o constipation and stomach pain. Would like a disability form filled out.  . Immunizations    pt would like flu shot   Annual  1. BP elevated today on norvasc 10 mg and wants refill of losartan 25 mg qd c/w stress at home washer broken and also HVAC 2. Constipation metamucil no help, miralax no help, linzess too much or not enough, dulcolax does not want to take daily due to side effects Trulance did not get for [facility]. Tried high fiber diet and caused increased bloating 3. Disability paperwork for ortho reason advised ortho fill this out    Review of Systems  Constitutional: Negative for weight loss.  HENT: Negative for hearing loss.   Eyes: Negative for blurred vision.  Respiratory: Negative for shortness of breath.   Cardiovascular: Negative for chest pain.  Gastrointestinal: Positive for constipation.  Musculoskeletal: Positive for joint pain.  Skin: Negative for rash.  Neurological: Negative for headaches.  Psychiatric/Behavioral: Negative for depression.       +stress     Past Medical History:  Diagnosis Date  . Abdominal pain, epigastric 12/17/2015  . Anxiety    pt denies having dx of anxiety  . BCC (basal cell carcinoma of skin)    right foot   . Bunion, right foot   . Chronic cough   . Closed fibular fracture 10/2008   Left - Sustained 2/2 fall down stairs (same fall as tibular fracture). S/P internal fixation.  . Duodenitis    EGD (07/14/2011) showing chronic duodenitis, H.pyloir neg  . Elevated liver enzymes   . Endometriosis    S/P TAH and salpingo-oophorectomy, right  . Epigastric pain    Chronic, thought 2/2 gastritis, EGD (07/14/2011) showing chronic duodenitis, H.pyloir neg // Repeat EGD (09/2011) - esophagealring, sessible polyp in stomach body, hiatal hernia - rec ad probiotic, await bx  . Esophageal ring    s/p dilatation during EGD (07/2011) - Dr. Oneida Alar  . Fatty liver    . Gastritis    Thought due to chronic NSAID use 2/2 pain from her congenital hip abnormality  . Headache(784.0)    Migraines  . Hidradenitis suppurativa 05/2006  . 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 With chronic OA of hip  . History of migraine headaches    Typical symptoms - bright lights, unilateral (starting behind her left ear), throbbing .  Marland Kitchen Hot flashes   . Hypertension   . Internal hemorrhoids   . Osteoarthritis of hip   . Ovarian cancer ([place]) 1995  . Protrusio acetabuli    diagnosed at age 8  . Sessile colonic polyp    noted 09/2011 -- > rec colonoscopy in 10 years, 2023  . Surgical menopause 08/2007   On HRT. Occuring since 01/09 following TAH and R-salpingo-oophorectomy   . Tibial fracture 10/2008   Left - Sustained 2/2 fall down stairs. Patient is S/P closed internal fixation with Smith Nephew tibial nail locked proximal and distal  . Vitamin D deficiency 11/2009   Vit D level 15 in 11/2009   Past Surgical History:  Procedure Laterality Date  . ABDOMINAL HYSTERECTOMY    . CESAREAN SECTION  08/2005   Primary low transverse  . COLONOSCOPY  09/22/2011   UMP:NTIRWER polyp in the rectum/Internal hemorrhoids, benign path  . ESOPHAGOGASTRODUODENOSCOPY  09/22/2011   XVQ:MGQQ, esophageal/Sessile polyp in the  body of the stomach/Hiatal hernia/ABDOMINAL PAIN LIKELY FUNCTIONAL V. POST-INFECTIOUS IBS, path: mild chronic gastritis  . FRACTURE SURGERY     Right hip  . JOINT REPLACEMENT     right hip  . LAPAROSCOPY  10/2000   Operative laparoscopy with lysis of right adnexal adhesions and uterointestinal adhesions  . left tib fib    . OTHER SURGICAL HISTORY  02/2009   Closed treatment internal fixation, left tibia with Tamala Julian and Nephew tibial nail locked proximal and distal  . TOTAL ABDOMINAL HYSTERECTOMY W/ BILATERAL SALPINGOOPHORECTOMY  08/2007   for endometriosis. With concurrent right  salpingo-oophorectomy  (2009), left oophorectomy (1995)  . TUBAL LIGATION  08/2005   Right tubal ligation   Family History  Problem Relation Age of Onset  . Other Father        deceased while in service.   . Breast cancer Sister 48       remission  . Cancer Sister        breast  . Lupus Cousin   . Other Mother        unknown medical history  . Dementia Maternal Grandmother        at old age  . Parkinson's disease Maternal Grandfather   . Multiple sclerosis Other        uncle   . Colon cancer Neg Hx   . Liver disease Neg Hx   . Migraines Neg Hx    Social History   Socioeconomic History  . Marital status: Married    Spouse name: Not on file  . Number of children: 2  . Years of education: bs degree  . Highest education level: Not on file  Occupational History  . 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 Smoking status: Never Smoker  . Smokeless tobacco: Never Used  Vaping Use  . Vaping Use: Never used  Substance and Sexual Activity  . Alcohol use: No    Alcohol/week: 0.0 standard drinks  . Drug use: No  . Sexual activity: Not on file  Other Topics Concern  . Not on file  Social History Narrative   Insurance: Medicare.   Patient is married with two children, [name] and [name] (son 58 y.o as of 03/22/20 going to [employer]).    She lives in [place] but children go to school in [place]    Right handed   Caffeine: no   Social Determinants of Health   Financial Resource Strain:   . Difficulty of Paying Living Expenses: Not on file  Food Insecurity:   . Worried About Charity fundraiser in the Last Year: Not on file  . Ran Out of Food in the Last Year: Not on file  Transportation Needs:   . Lack of Transportation (Medical): Not on file  . Lack of Transportation (Non-Medical): Not on file  Physical Activity:   . Days of Exercise per Week: Not on file  . Minutes of Exercise per Session: Not on file  Stress:   . Feeling of Stress  : Not on file  Social Connections:   . Frequency of Communication with Friends and Family: Not on file  . Frequency of Social Gatherings with Friends and Family: Not on file  . Attends Religious Services: Not on file  . Active Member of Clubs or Organizations: Not on file  . Attends Archivist Meetings: Not on file  . Marital Status: Not on file  Intimate Partner Violence:   .  Fear of Current or Ex-Partner: Not on file  . Emotionally Abused: Not on file  . Physically Abused: Not on file  . Sexually Abused: Not on file   Current Meds  Medication Sig  . amLODipine (NORVASC) 10 MG tablet Take 1 tablet (10 mg total) by mouth daily.  . cyclobenzaprine (FLEXERIL) 10 MG tablet Take 1 tablet (10 mg total) by mouth at bedtime as needed for muscle spasms.  . diazepam (VALIUM) 5 MG tablet Take 1-2 tablets (5-10 mg total) by mouth once as needed for up to 1 dose for anxiety (1 pill 15 min before MRI and 1 before MRI).  . furosemide (LASIX) 20 MG tablet Take 1 tablet (20 mg total) by mouth daily as needed.  . hydroquinone 4 % cream Apply topically at bedtime. qhs x 3-4 months then 2x per week as needed  . Multiple Vitamin (MULTIVITAMIN PO) Take by mouth.  Marland Kitchen VITAMIN E PO Take by mouth.  . [DISCONTINUED] predniSONE (DELTASONE) 20 MG tablet Take 1 tablet (20 mg total) by mouth daily with breakfast.   Allergies  Allergen Reactions  . Pineapple Shortness Of Breath and Swelling    Has Epi pen  . Eggs Or Egg-Derived Products     GI issues   . Hctz [Hydrochlorothiazide]     Headaches, foggy headed    . Latex Swelling  . Lisinopril     Cough   . Morphine And Related Itching  . Tape Swelling   No results found for this or any previous visit (from the past 2160 hour(s)). Objective  Body mass index is 27.55 kg/m. Wt Readings from Last 3 Encounters:  05/16/20 203 lb 3.2 oz (92.2 kg)  05/03/20 203 lb 12.8 oz (92.4 kg)  03/22/20 201 lb 12.8 oz (91.5 kg)   Temp Readings from Last 3  Encounters:  05/16/20 98.2 F (36.8 C)  05/03/20 98.3 F (36.8 C) (Oral)  03/22/20 97.6 F (36.4 C) (Oral)   BP Readings from Last 3 Encounters:  05/16/20 (!) 142/108  05/03/20 140/90  03/22/20 122/84   Pulse Readings from Last 3 Encounters:  05/16/20 78  05/03/20 64  03/22/20 64    Physical Exam Vitals and nursing note reviewed.  Constitutional:      Appearance: Normal appearance. She is well-developed and well-groomed. She is obese.  HENT:     Head: Normocephalic and atraumatic.  Eyes:     Conjunctiva/sclera: Conjunctivae normal.     Pupils: Pupils are equal, round, and reactive to light.  Cardiovascular:     Rate and Rhythm: Normal rate and regular rhythm.     Heart sounds: Normal heart sounds. No murmur heard.   Pulmonary:     Effort: Pulmonary effort is normal.     Breath sounds: Normal breath sounds.  Skin:    General: Skin is warm and dry.  Neurological:     General: No focal deficit present.     Mental Status: She is alert and oriented to person, place, and time. Mental status is at baseline.     Gait: Gait normal.     Comments: Walking with cane   Psychiatric:        Attention and Perception: Attention and perception normal.        Mood and Affect: Mood and affect normal.        Speech: Speech normal.        Behavior: Behavior normal. Behavior is cooperative.        Thought Content: Thought content  normal.        Cognition and Memory: Cognition and memory normal.        Judgment: Judgment normal.     Assessment  Plan  Primary hypertension - Plan: losartan (COZAAR) 25 MG tablet, Comprehensive metabolic panel, Lipid panel, CBC with Differential/Platelet, Urinalysis, Routine w reflex microscopic Losartan 25 mg and norvasc 10 mg  Monitor  Fasting labs upcoming  Constipation, unspecified constipation type Irritable bowel syndrome with constipation F/u [facility]  rec colace as well otc   Prediabetes - Plan: Hemoglobin A1c  HM Flu shot left  arm today   Hep B immune, consider hep A vaccineh/o fatty liver Tdap up to date10/26/17 pna 23 had 02/19/09 HCV neg 06/05/16  HIV neg 06/05/16 covid 06/20/20 booster due pfizer   mammo 06/20/18 negreferred again [facility] with dexa -sch 06/2020  Get copy of pap [facility] [place] 2019 s/p hysterectomyfor endometriosis in 2007 prior LSO in 1995 for ovarian issues per OB/GYN notes 05/2018 [facility] -no rec paps per OB/GYN no h/o abnormals per note 05/14/18 ob/gyn  -sch 06/2020   [facility] colon[facility] and utd no issues chronic IBS-C   derm appt Dr. Leonie Green 05/21/20 excision cyst mid low back  Provider: Dr. Olivia Mackie McLean-Scocuzza-Internal Medicine

## 2020-05-17 ENCOUNTER — Telehealth: Payer: Self-pay | Admitting: Internal Medicine

## 2020-05-17 DIAGNOSIS — S161XXA Strain of muscle, fascia and tendon at neck level, initial encounter: Secondary | ICD-10-CM | POA: Diagnosis not present

## 2020-05-17 DIAGNOSIS — R519 Headache, unspecified: Secondary | ICD-10-CM | POA: Diagnosis not present

## 2020-05-17 DIAGNOSIS — M9901 Segmental and somatic dysfunction of cervical region: Secondary | ICD-10-CM | POA: Diagnosis not present

## 2020-05-17 DIAGNOSIS — M5412 Radiculopathy, cervical region: Secondary | ICD-10-CM | POA: Diagnosis not present

## 2020-05-17 NOTE — Telephone Encounter (Signed)
Faxed to [facility] pharmacy request for refill or new prescription for Losartan potassium 25 mg tab 90.0 take 1 tablet 25 mg by mouth daily in abdominal mass. Faxed on 05-17-20

## 2020-05-17 NOTE — Telephone Encounter (Signed)
Pt was seen in office on 05/16/20 and medication was discussed.

## 2020-05-20 ENCOUNTER — Ambulatory Visit: Payer: BC Managed Care – PPO

## 2020-05-21 DIAGNOSIS — L720 Epidermal cyst: Secondary | ICD-10-CM | POA: Diagnosis not present

## 2020-05-24 ENCOUNTER — Other Ambulatory Visit: Payer: BC Managed Care – PPO

## 2020-05-29 DIAGNOSIS — R42 Dizziness and giddiness: Secondary | ICD-10-CM | POA: Diagnosis not present

## 2020-05-29 DIAGNOSIS — E663 Overweight: Secondary | ICD-10-CM | POA: Diagnosis not present

## 2020-05-29 DIAGNOSIS — K581 Irritable bowel syndrome with constipation: Secondary | ICD-10-CM | POA: Diagnosis not present

## 2020-05-29 DIAGNOSIS — K5909 Other constipation: Secondary | ICD-10-CM | POA: Diagnosis not present

## 2020-05-29 DIAGNOSIS — K7581 Nonalcoholic steatohepatitis (NASH): Secondary | ICD-10-CM | POA: Diagnosis not present

## 2020-05-29 DIAGNOSIS — Z6827 Body mass index (BMI) 27.0-27.9, adult: Secondary | ICD-10-CM | POA: Diagnosis not present

## 2020-05-29 DIAGNOSIS — F411 Generalized anxiety disorder: Secondary | ICD-10-CM | POA: Diagnosis not present

## 2020-05-29 DIAGNOSIS — K5900 Constipation, unspecified: Secondary | ICD-10-CM | POA: Diagnosis not present

## 2020-05-31 DIAGNOSIS — E039 Hypothyroidism, unspecified: Secondary | ICD-10-CM | POA: Diagnosis not present

## 2020-05-31 DIAGNOSIS — E2740 Unspecified adrenocortical insufficiency: Secondary | ICD-10-CM | POA: Diagnosis not present

## 2020-06-04 DIAGNOSIS — L720 Epidermal cyst: Secondary | ICD-10-CM | POA: Diagnosis not present

## 2020-06-04 DIAGNOSIS — Z4802 Encounter for removal of sutures: Secondary | ICD-10-CM | POA: Diagnosis not present

## 2020-06-05 DIAGNOSIS — S161XXA Strain of muscle, fascia and tendon at neck level, initial encounter: Secondary | ICD-10-CM | POA: Diagnosis not present

## 2020-06-05 DIAGNOSIS — M9901 Segmental and somatic dysfunction of cervical region: Secondary | ICD-10-CM | POA: Diagnosis not present

## 2020-06-05 DIAGNOSIS — R519 Headache, unspecified: Secondary | ICD-10-CM | POA: Diagnosis not present

## 2020-06-05 DIAGNOSIS — M5412 Radiculopathy, cervical region: Secondary | ICD-10-CM | POA: Diagnosis not present

## 2020-06-06 DIAGNOSIS — Z9889 Other specified postprocedural states: Secondary | ICD-10-CM | POA: Diagnosis not present

## 2020-06-06 DIAGNOSIS — L723 Sebaceous cyst: Secondary | ICD-10-CM | POA: Diagnosis not present

## 2020-06-06 DIAGNOSIS — Z872 Personal history of diseases of the skin and subcutaneous tissue: Secondary | ICD-10-CM | POA: Diagnosis not present

## 2020-06-06 DIAGNOSIS — L732 Hidradenitis suppurativa: Secondary | ICD-10-CM | POA: Diagnosis not present

## 2020-06-11 DIAGNOSIS — Z780 Asymptomatic menopausal state: Secondary | ICD-10-CM | POA: Diagnosis not present

## 2020-06-11 DIAGNOSIS — Z1231 Encounter for screening mammogram for malignant neoplasm of breast: Secondary | ICD-10-CM | POA: Diagnosis not present

## 2020-06-11 DIAGNOSIS — Z803 Family history of malignant neoplasm of breast: Secondary | ICD-10-CM | POA: Diagnosis not present

## 2020-06-11 LAB — HM MAMMOGRAPHY

## 2020-06-11 LAB — HM DEXA SCAN: HM Dexa Scan: NORMAL

## 2020-06-12 ENCOUNTER — Encounter: Payer: Self-pay | Admitting: Internal Medicine

## 2020-06-12 DIAGNOSIS — R519 Headache, unspecified: Secondary | ICD-10-CM | POA: Diagnosis not present

## 2020-06-12 DIAGNOSIS — M5412 Radiculopathy, cervical region: Secondary | ICD-10-CM | POA: Diagnosis not present

## 2020-06-12 DIAGNOSIS — S161XXA Strain of muscle, fascia and tendon at neck level, initial encounter: Secondary | ICD-10-CM | POA: Diagnosis not present

## 2020-06-12 DIAGNOSIS — M9901 Segmental and somatic dysfunction of cervical region: Secondary | ICD-10-CM | POA: Diagnosis not present

## 2020-06-19 DIAGNOSIS — M9901 Segmental and somatic dysfunction of cervical region: Secondary | ICD-10-CM | POA: Diagnosis not present

## 2020-06-19 DIAGNOSIS — M5412 Radiculopathy, cervical region: Secondary | ICD-10-CM | POA: Diagnosis not present

## 2020-06-19 DIAGNOSIS — R519 Headache, unspecified: Secondary | ICD-10-CM | POA: Diagnosis not present

## 2020-06-19 DIAGNOSIS — S161XXA Strain of muscle, fascia and tendon at neck level, initial encounter: Secondary | ICD-10-CM | POA: Diagnosis not present

## 2020-06-26 ENCOUNTER — Encounter: Payer: Self-pay | Admitting: Internal Medicine

## 2020-06-26 DIAGNOSIS — S161XXA Strain of muscle, fascia and tendon at neck level, initial encounter: Secondary | ICD-10-CM | POA: Diagnosis not present

## 2020-06-26 DIAGNOSIS — R519 Headache, unspecified: Secondary | ICD-10-CM | POA: Diagnosis not present

## 2020-06-26 DIAGNOSIS — M9901 Segmental and somatic dysfunction of cervical region: Secondary | ICD-10-CM | POA: Diagnosis not present

## 2020-06-26 DIAGNOSIS — M5412 Radiculopathy, cervical region: Secondary | ICD-10-CM | POA: Diagnosis not present

## 2020-07-26 ENCOUNTER — Telehealth: Payer: Self-pay

## 2020-07-26 NOTE — Telephone Encounter (Signed)
e

## 2020-07-30 DIAGNOSIS — L299 Pruritus, unspecified: Secondary | ICD-10-CM | POA: Diagnosis not present

## 2020-07-30 DIAGNOSIS — M26621 Arthralgia of right temporomandibular joint: Secondary | ICD-10-CM | POA: Diagnosis not present

## 2020-08-16 ENCOUNTER — Ambulatory Visit: Payer: BC Managed Care – PPO | Admitting: Internal Medicine

## 2020-08-16 ENCOUNTER — Telehealth: Payer: Self-pay

## 2020-08-16 NOTE — Telephone Encounter (Signed)
Pt shaved her pubic area and her skin is inflamed and irritated. She tried her dermatologist but they are booked and we have no available appointments tomorrow. She wants to know what she should do or if Dr. French Ana can call her in something for the irritation since this is a skin condition she already has.

## 2020-08-17 NOTE — Telephone Encounter (Signed)
She has topical clindamycin she can use and otc hydrocortisone  No shaving for now

## 2020-08-17 NOTE — Telephone Encounter (Signed)
Left detailed message for patient with the direction, if any questions she is to return call back.

## 2020-09-12 DIAGNOSIS — L732 Hidradenitis suppurativa: Secondary | ICD-10-CM | POA: Diagnosis not present

## 2020-09-12 DIAGNOSIS — Z79899 Other long term (current) drug therapy: Secondary | ICD-10-CM | POA: Diagnosis not present

## 2020-09-13 ENCOUNTER — Ambulatory Visit: Payer: BC Managed Care – PPO | Admitting: Internal Medicine

## 2020-09-20 DIAGNOSIS — M5459 Other low back pain: Secondary | ICD-10-CM | POA: Diagnosis not present

## 2020-09-20 DIAGNOSIS — Z96643 Presence of artificial hip joint, bilateral: Secondary | ICD-10-CM | POA: Diagnosis not present

## 2020-09-20 DIAGNOSIS — M47816 Spondylosis without myelopathy or radiculopathy, lumbar region: Secondary | ICD-10-CM | POA: Diagnosis not present

## 2020-09-20 DIAGNOSIS — M47898 Other spondylosis, sacral and sacrococcygeal region: Secondary | ICD-10-CM | POA: Diagnosis not present

## 2020-09-20 DIAGNOSIS — Z471 Aftercare following joint replacement surgery: Secondary | ICD-10-CM | POA: Diagnosis not present

## 2020-09-24 DIAGNOSIS — F411 Generalized anxiety disorder: Secondary | ICD-10-CM | POA: Diagnosis not present

## 2020-10-08 DIAGNOSIS — F411 Generalized anxiety disorder: Secondary | ICD-10-CM | POA: Diagnosis not present

## 2020-10-09 DIAGNOSIS — M6289 Other specified disorders of muscle: Secondary | ICD-10-CM | POA: Diagnosis not present

## 2020-10-09 DIAGNOSIS — K581 Irritable bowel syndrome with constipation: Secondary | ICD-10-CM | POA: Diagnosis not present

## 2020-10-09 DIAGNOSIS — K760 Fatty (change of) liver, not elsewhere classified: Secondary | ICD-10-CM | POA: Diagnosis not present

## 2020-10-09 DIAGNOSIS — K580 Irritable bowel syndrome with diarrhea: Secondary | ICD-10-CM | POA: Diagnosis not present

## 2020-10-22 ENCOUNTER — Telehealth: Payer: Self-pay | Admitting: Neurology

## 2020-10-22 NOTE — Telephone Encounter (Signed)
We haven't seen her for over a year not sure we have anything with me sooner. She can see her primary care in the meantime.

## 2020-10-22 NOTE — Telephone Encounter (Signed)
Pt called, having intense sharp, comes and goes in top of my head.  Pt stated, had discuss last office visit. Have scheduled an appt, 01/23/21  Would like a call from the nurse for a sooner appt.

## 2020-10-23 NOTE — Telephone Encounter (Signed)
Pt. called back & was read message from doctor. She stated that she will find another doctor.

## 2020-10-25 ENCOUNTER — Telehealth: Payer: Self-pay | Admitting: Internal Medicine

## 2020-10-25 DIAGNOSIS — G588 Other specified mononeuropathies: Secondary | ICD-10-CM | POA: Diagnosis not present

## 2020-10-25 DIAGNOSIS — M5386 Other specified dorsopathies, lumbar region: Secondary | ICD-10-CM | POA: Diagnosis not present

## 2020-10-25 DIAGNOSIS — R278 Other lack of coordination: Secondary | ICD-10-CM | POA: Diagnosis not present

## 2020-10-25 DIAGNOSIS — R102 Pelvic and perineal pain: Secondary | ICD-10-CM | POA: Diagnosis not present

## 2020-10-25 DIAGNOSIS — M6289 Other specified disorders of muscle: Secondary | ICD-10-CM | POA: Diagnosis not present

## 2020-10-25 DIAGNOSIS — M6281 Muscle weakness (generalized): Secondary | ICD-10-CM | POA: Diagnosis not present

## 2020-10-25 DIAGNOSIS — M792 Neuralgia and neuritis, unspecified: Secondary | ICD-10-CM | POA: Diagnosis not present

## 2020-10-25 DIAGNOSIS — M25551 Pain in right hip: Secondary | ICD-10-CM | POA: Diagnosis not present

## 2020-10-25 DIAGNOSIS — M25552 Pain in left hip: Secondary | ICD-10-CM | POA: Diagnosis not present

## 2020-10-25 DIAGNOSIS — M62838 Other muscle spasm: Secondary | ICD-10-CM | POA: Diagnosis not present

## 2020-10-25 NOTE — Telephone Encounter (Signed)
Patient has had off and on sharp pain on the top of her head and down her body for 2 months  Off and on. Now it has started everyday.Please call cell, [phone].

## 2020-10-25 NOTE — Telephone Encounter (Signed)
Patient stated her neurologist cant get her in and to call pcp. She has appoint with pcp 10-30-20

## 2020-10-26 NOTE — Telephone Encounter (Signed)
Dr. Jaynee Eagles can you try to work her in history of chronic h/a/migraines  I didn't have any appts 3/17 or 3/18 If pt can schedule next week if neurology not able to get in  If issues over the weekend rec [facility] in [place] or [facility] in [place] [facility] [place] or [facility]  She can try over the counter medications for headache like excedrin but if this not helping please be seen   Inform above

## 2020-10-27 NOTE — Telephone Encounter (Signed)
We have her on a wait list for a sooner appointment. We haven't seen her in over a year and this is a new issue so she has to see me and not an NP in our office unfortunately per office policy. And like you, I am booked up, trying see patients as quick as we can.  We last saw her for concussion last over a year ago, she never followed up. But we have her on a cancellation list.   Thanks.

## 2020-10-29 NOTE — Telephone Encounter (Signed)
Advise neurology Dr. Jaynee Eagles has her on appt cancellation list issue seems neurological  We have her on a wait list for a sooner appointment. We haven't seen her in over a year and this is a new issue so she has to see me and not an NP in our office unfortunately per office policy. And like you, I am booked up, trying see patients as quick as we can.  We last saw her for concussion last over a year ago, she never followed up. But we have her on a cancellation list.   Thanks.       Documentation     You  Melvenia Beam, MD; Babs Bertin, [place]; Thressa Sheller, CMA 3 days ago   TM    Dr. Jaynee Eagles can you try to work her in history of chronic h/a/migraines  I didn't have any appts 3/17 or 3/18 If pt can schedule next week if neurology not able to get in  If issues over the weekend rec urgent care [facility] in [place] or [facility] in [place] [facility] [place] or [facility]  She can try over the counter medications for headache like excedrin but if this not helping please be seen    Inform above

## 2020-10-29 NOTE — Telephone Encounter (Signed)
Left message informing the Patient that we will see her tomorrow, 10/30/20, and that she is on a cancellation list for neurology.

## 2020-10-30 ENCOUNTER — Telehealth: Payer: Self-pay | Admitting: Internal Medicine

## 2020-10-30 ENCOUNTER — Encounter: Payer: Self-pay | Admitting: Internal Medicine

## 2020-10-30 ENCOUNTER — Other Ambulatory Visit: Payer: Self-pay

## 2020-10-30 ENCOUNTER — Ambulatory Visit (INDEPENDENT_AMBULATORY_CARE_PROVIDER_SITE_OTHER): Payer: BC Managed Care – PPO | Admitting: Internal Medicine

## 2020-10-30 VITALS — BP 122/76 | HR 60 | Temp 98.7°F | Ht 72.01 in | Wt 204.2 lb

## 2020-10-30 DIAGNOSIS — R55 Syncope and collapse: Secondary | ICD-10-CM

## 2020-10-30 DIAGNOSIS — Z1329 Encounter for screening for other suspected endocrine disorder: Secondary | ICD-10-CM

## 2020-10-30 DIAGNOSIS — I10 Essential (primary) hypertension: Secondary | ICD-10-CM

## 2020-10-30 DIAGNOSIS — R269 Unspecified abnormalities of gait and mobility: Secondary | ICD-10-CM

## 2020-10-30 DIAGNOSIS — R413 Other amnesia: Secondary | ICD-10-CM | POA: Diagnosis not present

## 2020-10-30 DIAGNOSIS — G43909 Migraine, unspecified, not intractable, without status migrainosus: Secondary | ICD-10-CM

## 2020-10-30 DIAGNOSIS — R7303 Prediabetes: Secondary | ICD-10-CM | POA: Diagnosis not present

## 2020-10-30 DIAGNOSIS — N62 Hypertrophy of breast: Secondary | ICD-10-CM | POA: Diagnosis not present

## 2020-10-30 DIAGNOSIS — Z13818 Encounter for screening for other digestive system disorders: Secondary | ICD-10-CM

## 2020-10-30 DIAGNOSIS — M542 Cervicalgia: Secondary | ICD-10-CM | POA: Diagnosis not present

## 2020-10-30 DIAGNOSIS — R42 Dizziness and giddiness: Secondary | ICD-10-CM | POA: Diagnosis not present

## 2020-10-30 DIAGNOSIS — M5416 Radiculopathy, lumbar region: Secondary | ICD-10-CM

## 2020-10-30 DIAGNOSIS — E559 Vitamin D deficiency, unspecified: Secondary | ICD-10-CM

## 2020-10-30 DIAGNOSIS — Z1389 Encounter for screening for other disorder: Secondary | ICD-10-CM

## 2020-10-30 DIAGNOSIS — R4701 Aphasia: Secondary | ICD-10-CM

## 2020-10-30 DIAGNOSIS — F40240 Claustrophobia: Secondary | ICD-10-CM

## 2020-10-30 MED ORDER — LOSARTAN POTASSIUM 25 MG PO TABS: 25.0000 mg | ORAL_TABLET | Freq: Every day | ORAL | 3 refills | Status: DC

## 2020-10-30 MED ORDER — DIAZEPAM 5 MG PO TABS: 5.0000 mg | ORAL_TABLET | Freq: Once | ORAL | 1 refills | Status: DC | PRN

## 2020-10-30 MED ORDER — AMLODIPINE BESYLATE 10 MG PO TABS
10.0000 mg | ORAL_TABLET | Freq: Every day | ORAL | 3 refills | Status: DC
Start: 2020-10-30 — End: 2021-06-12

## 2020-10-30 NOTE — Progress Notes (Signed)
Chief Complaint  Patient presents with  . Headache    Sharp pains off and on for 2 months. Has been increasing in frequency    F/u  1. HTn controlled on norvasc 10 mg qd not taking lasix 20 mg qd and losartan 25 mg qd  2. Reason for visit top of head painful and increasing in freq and now daily though present x 2 months with h/o migraines was on imitrex in the past this does not feel like migraine b/c typically has vision/taste and smell sx's.  She had an episode of presyncope while driving. She has increased stress (worried about kids in school with covid and neighbor trouble) having word finding issues and frontal h/a which advil helps and memory loss  3. Large breasts and chronic neck pain and low back pain thinks maybe related wants plastics consult    Review of Systems  Constitutional: Negative for weight loss.  HENT: Negative for hearing loss.   Eyes: Positive for photophobia. Negative for blurred vision.  Respiratory: Negative for shortness of breath.   Cardiovascular: Negative for chest pain.  Musculoskeletal: Positive for back pain and neck pain.  Skin: Negative for rash.  Neurological: Positive for dizziness and headaches.       +presyncope   Psychiatric/Behavioral: Positive for memory loss. Negative for depression.       +stress   Past Medical History:  Diagnosis Date  . Abdominal pain, epigastric 12/17/2015  . Anxiety    pt denies having dx of anxiety  . BCC (basal cell carcinoma of skin)    right foot   . Bunion, right foot   . Chronic cough   . Closed fibular fracture 10/2008   Left - Sustained 2/2 fall down stairs (same fall as tibular fracture). S/P internal fixation.  . Duodenitis    EGD (07/14/2011) showing chronic duodenitis, H.pyloir neg  . Elevated liver enzymes   . Endometriosis    S/P TAH and salpingo-oophorectomy, right  . Epigastric pain    Chronic, thought 2/2 gastritis, EGD (07/14/2011) showing chronic duodenitis, H.pyloir neg // Repeat EGD (09/2011)  - esophagealring, sessible polyp in stomach body, hiatal hernia - rec ad probiotic, await bx  . Esophageal ring    s/p dilatation during EGD (07/2011) - Dr. Oneida Alar  . Fatty liver   . Gastritis    Thought due to chronic NSAID use 2/2 pain from her congenital hip abnormality  . Headache(784.0)    Migraines  . Hidradenitis suppurativa 05/2006  . 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 With chronic OA of hip  . History of migraine headaches    Typical symptoms - bright lights, unilateral (starting behind her left ear), throbbing .  Marland Kitchen Hot flashes   . Hypertension   . Internal hemorrhoids   . Osteoarthritis of hip   . Ovarian cancer ([place]) 1995  . Protrusio acetabuli    diagnosed at age 84  . Sessile colonic polyp    noted 09/2011 -- > rec colonoscopy in 10 years, 2023  . Surgical menopause 08/2007   On HRT. Occuring since 01/09 following TAH and R-salpingo-oophorectomy   . Tibial fracture 10/2008   Left - Sustained 2/2 fall down stairs. Patient is S/P closed internal fixation with Smith Nephew tibial nail locked proximal and distal  . Vitamin D deficiency 11/2009   Vit D level 15 in 11/2009   Past Surgical History:  Procedure Laterality Date  . ABDOMINAL HYSTERECTOMY    .  CESAREAN SECTION  08/2005   Primary low transverse  . COLONOSCOPY  09/22/2011   KZS:WFUXNAT polyp in the rectum/Internal hemorrhoids, benign path  . ESOPHAGOGASTRODUODENOSCOPY  09/22/2011   FTD:DUKG, esophageal/Sessile polyp in the body of the stomach/Hiatal hernia/ABDOMINAL PAIN LIKELY FUNCTIONAL V. POST-INFECTIOUS IBS, path: mild chronic gastritis  . FRACTURE SURGERY     Right hip  . JOINT REPLACEMENT     right hip  . LAPAROSCOPY  10/2000   Operative laparoscopy with lysis of right adnexal adhesions and uterointestinal adhesions  . left tib fib    . OTHER SURGICAL HISTORY  02/2009   Closed treatment internal fixation, left tibia with Tamala Julian and  Nephew tibial nail locked proximal and distal  . TOTAL ABDOMINAL HYSTERECTOMY W/ BILATERAL SALPINGOOPHORECTOMY  08/2007   for endometriosis. With concurrent right salpingo-oophorectomy  (2009), left oophorectomy (1995)  . TUBAL LIGATION  08/2005   Right tubal ligation   Family History  Problem Relation Age of Onset  . Other Father        deceased while in service.   . Breast cancer Sister 94       remission  . Cancer Sister        breast  . Lupus Cousin   . Other Mother        unknown medical history  . Dementia Maternal Grandmother        at old age  . Parkinson's disease Maternal Grandfather   . Multiple sclerosis Other        uncle   . Colon cancer Neg Hx   . Liver disease Neg Hx   . Migraines Neg Hx    Social History   Socioeconomic History  . Marital status: Married    Spouse name: Not on file  . Number of children: 2  . Years of education: bs degree  . Highest education level: Not on file  Occupational History  . 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 Smoking status: Never Smoker  . Smokeless tobacco: Never Used  Vaping Use  . Vaping Use: Never used  Substance and Sexual Activity  . Alcohol use: No    Alcohol/week: 0.0 standard drinks  . Drug use: No  . Sexual activity: Not on file  Other Topics Concern  . Not on file  Social History Narrative   Insurance: Medicare.   Patient is married with two children, Madison and Tamera Punt (son 31 y.o as of 03/22/20 going to [employer]).    She lives in [place] but children go to school in [place]    Right handed   Caffeine: no   Social Determinants of Health   Financial Resource Strain: Not on file  Food Insecurity: Not on file  Transportation Needs: Not on file  Physical Activity: Not on file  Stress: Not on file  Social Connections: Not on file  Intimate Partner Violence: Not on file   Current Meds  Medication Sig  . hydroquinone 4 % cream Apply topically at bedtime. qhs x  3-4 months then 2x per week as needed  . Multiple Vitamin (MULTIVITAMIN PO) Take by mouth.  Marland Kitchen VITAMIN E PO Take by mouth.  . [DISCONTINUED] amLODipine (NORVASC) 10 MG tablet Take 1 tablet (10 mg total) by mouth daily.  . [DISCONTINUED] losartan (COZAAR) 25 MG tablet Take 1 tablet (25 mg total) by mouth daily. Take if BP >130/>80   Allergies  Allergen Reactions  . Pineapple Shortness Of Breath and Swelling  Has Epi pen  . Eggs Or Egg-Derived Products     GI issues   . Hctz [Hydrochlorothiazide]     Headaches, foggy headed    . Latex Swelling  . Lisinopril     Cough   . Morphine And Related Itching  . Tape Swelling   No results found for this or any previous visit (from the past 2160 hour(s)). Objective  Body mass index is 27.69 kg/m. Wt Readings from Last 3 Encounters:  10/30/20 204 lb 3.2 oz (92.6 kg)  05/16/20 203 lb 3.2 oz (92.2 kg)  05/03/20 203 lb 12.8 oz (92.4 kg)   Temp Readings from Last 3 Encounters:  10/30/20 98.7 F (37.1 C) (Oral)  05/16/20 98.2 F (36.8 C)  05/03/20 98.3 F (36.8 C) (Oral)   BP Readings from Last 3 Encounters:  10/30/20 122/76  05/16/20 (!) 142/108  05/03/20 140/90   Pulse Readings from Last 3 Encounters:  10/30/20 60  05/16/20 78  05/03/20 64    Physical Exam Vitals and nursing note reviewed.  Constitutional:      Appearance: Normal appearance. She is well-developed, well-groomed and overweight.  HENT:     Head: Normocephalic and atraumatic.  Cardiovascular:     Rate and Rhythm: Normal rate and regular rhythm.     Heart sounds: Normal heart sounds. No murmur heard.   Pulmonary:     Effort: Pulmonary effort is normal.     Breath sounds: Normal breath sounds.  Skin:    General: Skin is warm and dry.  Neurological:     General: No focal deficit present.     Mental Status: She is alert and oriented to person, place, and time. Mental status is at baseline.     Motor: No weakness.     Coordination: Coordination normal.  Finger-Nose-Finger Test normal.     Gait: Gait normal.     Comments: Walks with cane  CN 2-12 grossly intact sensation V1-3, upper and lower ext intact  Psychiatric:        Attention and Perception: Attention and perception normal.        Mood and Affect: Mood and affect normal.        Speech: Speech normal.        Behavior: Behavior normal. Behavior is cooperative.        Thought Content: Thought content normal.        Cognition and Memory: Cognition and memory normal.        Judgment: Judgment normal.     Assessment  Plan  Abnormal gait - Plan: MR Brain W Wo Contrast with valium , Ambulatory referral to Neurology Aphasia - Plan: MR Brain W Wo Contrast, Ambulatory referral to Neurology, CANCELED: MR Brain W Wo Contrast  Postural dizziness with presyncope - Plan: MR Brain W Wo Contrast, TSH, Basic Metabolic Panel (BMET), Ambulatory referral to Neurology  Migraine without status migrainosus, not intractable, unspecified migraine type - Plan: MR Brain W Wo Contrast, Ambulatory referral to Neurology,   Short-term memory loss - Plan: MR Brain W Wo Contrast, TSH, Ambulatory referral to Neurology,   Cervicalgia/chronic with chronic low back pain - Plan: Ambulatory referral to Plastic Surgery Large breasts - Plan: Ambulatory referral to Plastic Surgery  Prediabetes - Plan: Hemoglobin A1c  Vitamin D deficiency - Plan: Vitamin D (25 hydroxy)  Primary hypertension - Plan: losartan (COZAAR) 25 MG tablet  amLODipine (NORVASC) 10 MG tablet   HM Flu shot utd Hep B immune, consider hep A vaccineh/o fatty  liver Tdap up to date10/26/17 pna 23 had 02/19/09 HCV neg 06/05/16  HIV neg 06/05/16 covid 3/3  mammo 06/20/18 negreferred again [facility] -[facility] 11/2021had mammo and dexa  Get copy of pap [facility] [place] saw 2019 s/p hysterectomyfor endometriosis in 2007 prior [facility] in 1995 for ovarian issues per OB/GYN notes 05/2018 [facility] -no rec paps per OB/GYN no h/o abnormals per  note 05/14/18 ob/gyn   [facility] colonoscopy and utd no issues chronic IBS-C   derm appt Dr. Leonie Green 05/21/20 excision cyst mid low back  Provider: Dr. Olivia Mackie McLean-Scocuzza-Internal Medicine

## 2020-10-30 NOTE — Telephone Encounter (Signed)
lft vm for pt to call ofc to sch MRI. Thanks 

## 2020-10-30 NOTE — Patient Instructions (Signed)
Drink 55-64 ounces of water day    Syncope  Syncope refers to a condition in which a person temporarily loses consciousness. Syncope may also be called fainting or passing out. It is caused by a sudden decrease in blood flow to the brain. Even though most causes of syncope are not dangerous, syncope can be a sign of a serious medical problem. Your health care provider may do tests to find the reason why you are having syncope. Signs that you may be about to faint include:  Feeling dizzy or light-headed.  Feeling nauseous.  Seeing all white or all black in your field of vision.  Having cold, clammy skin. If you faint, get medical help right away. Call your local emergency services (911 in the [place] Do not drive yourself to the hospital. Follow these instructions at home: Pay attention to any changes in your symptoms. Take these actions to stay safe and to help relieve your symptoms: Lifestyle  Do not drive, use machinery, or play sports until your health care provider says it is okay.  Do not drink alcohol.  Do not use any products that contain nicotine or tobacco, such as cigarettes and e-cigarettes. If you need help quitting, ask your health care provider.  Drink enough fluid to keep your urine pale yellow. General instructions  Take over-the-counter and prescription medicines only as told by your health care provider.  If you are taking blood pressure or heart medicine, get up slowly and take several minutes to sit and then stand. This can reduce dizziness or light-headedness.  Have someone stay with you until you feel stable.  If you start to feel like you might faint, lie down right away and raise (elevate) your feet above the level of your heart. Breathe deeply and steadily. Wait until all the symptoms have passed.  Keep all follow-up visits as told by your health care provider. This is important. Get help right away if you:  Have a severe headache.  Faint once or  repeatedly.  Have pain in your chest, abdomen, or back.  Have a very fast or irregular heartbeat (palpitations).  Have pain when you breathe.  Are bleeding from your mouth or rectum, or you have black or tarry stool.  Have a seizure.  Are confused.  Have trouble walking.  Have severe weakness.  Have vision problems. These symptoms may represent a serious problem that is an emergency. Do not wait to see if your symptoms will go away. Get medical help right away. Call your local emergency services (911 in the [place] Do not drive yourself to the hospital. Summary  Syncope refers to a condition in which a person temporarily loses consciousness. It is caused by a sudden decrease in blood flow to the brain.  Signs that you may be about to faint include dizziness, feeling light-headed, feeling nauseous, sudden vision changes, or cold, clammy skin.  Although most causes of syncope are not dangerous, syncope can be a sign of a serious medical problem. If you faint, get medical help right away. This information is not intended to replace advice given to you by your health care provider. Make sure you discuss any questions you have with your health care provider. Document Revised: 12/08/2019 Document Reviewed: 12/08/2019 Elsevier Patient Education  [place].

## 2020-10-31 ENCOUNTER — Telehealth: Payer: Self-pay | Admitting: Internal Medicine

## 2020-10-31 NOTE — Telephone Encounter (Signed)
lft vm for pt to call ofc to sch MRI. Thanks

## 2020-11-01 ENCOUNTER — Other Ambulatory Visit (INDEPENDENT_AMBULATORY_CARE_PROVIDER_SITE_OTHER): Payer: BC Managed Care – PPO

## 2020-11-01 ENCOUNTER — Ambulatory Visit: Payer: Medicare Other

## 2020-11-01 ENCOUNTER — Other Ambulatory Visit: Payer: Self-pay

## 2020-11-01 DIAGNOSIS — R42 Dizziness and giddiness: Secondary | ICD-10-CM | POA: Diagnosis not present

## 2020-11-01 DIAGNOSIS — Z1389 Encounter for screening for other disorder: Secondary | ICD-10-CM | POA: Diagnosis not present

## 2020-11-01 DIAGNOSIS — Z13818 Encounter for screening for other digestive system disorders: Secondary | ICD-10-CM | POA: Diagnosis not present

## 2020-11-01 DIAGNOSIS — I10 Essential (primary) hypertension: Secondary | ICD-10-CM

## 2020-11-01 DIAGNOSIS — R7303 Prediabetes: Secondary | ICD-10-CM

## 2020-11-01 DIAGNOSIS — Z1329 Encounter for screening for other suspected endocrine disorder: Secondary | ICD-10-CM

## 2020-11-01 DIAGNOSIS — E559 Vitamin D deficiency, unspecified: Secondary | ICD-10-CM | POA: Diagnosis not present

## 2020-11-01 DIAGNOSIS — R55 Syncope and collapse: Secondary | ICD-10-CM | POA: Diagnosis not present

## 2020-11-01 DIAGNOSIS — R413 Other amnesia: Secondary | ICD-10-CM

## 2020-11-01 LAB — URINALYSIS, ROUTINE W REFLEX MICROSCOPIC
Bilirubin Urine: NEGATIVE
Hgb urine dipstick: NEGATIVE
Ketones, ur: NEGATIVE
Nitrite: NEGATIVE
Specific Gravity, Urine: 1.015 (ref 1.000–1.030)
Total Protein, Urine: NEGATIVE
Urine Glucose: NEGATIVE
Urobilinogen, UA: 0.2 (ref 0.0–1.0)
pH: 6 (ref 5.0–8.0)

## 2020-11-01 LAB — CBC WITH DIFFERENTIAL/PLATELET
Basophils Absolute: 0 10*3/uL (ref 0.0–0.1)
Basophils Relative: 0.3 % (ref 0.0–3.0)
Eosinophils Absolute: 0.1 10*3/uL (ref 0.0–0.7)
Eosinophils Relative: 1.7 % (ref 0.0–5.0)
HCT: 35.5 % — ABNORMAL LOW (ref 36.0–46.0)
Hemoglobin: 11.8 g/dL — ABNORMAL LOW (ref 12.0–15.0)
Lymphocytes Relative: 21.8 % (ref 12.0–46.0)
Lymphs Abs: 1.8 10*3/uL (ref 0.7–4.0)
MCHC: 33.3 g/dL (ref 30.0–36.0)
MCV: 82.7 fl (ref 78.0–100.0)
Monocytes Absolute: 0.7 10*3/uL (ref 0.1–1.0)
Monocytes Relative: 8.2 % (ref 3.0–12.0)
Neutro Abs: 5.5 10*3/uL (ref 1.4–7.7)
Neutrophils Relative %: 68 % (ref 43.0–77.0)
Platelets: 296 10*3/uL (ref 150.0–400.0)
RBC: 4.29 Mil/uL (ref 3.87–5.11)
RDW: 14.7 % (ref 11.5–15.5)
WBC: 8.1 10*3/uL (ref 4.0–10.5)

## 2020-11-01 LAB — HEMOGLOBIN A1C: Hgb A1c MFr Bld: 6.2 % (ref 4.6–6.5)

## 2020-11-01 LAB — BASIC METABOLIC PANEL
BUN: 13 mg/dL (ref 6–23)
CO2: 28 mEq/L (ref 19–32)
Calcium: 9.6 mg/dL (ref 8.4–10.5)
Chloride: 105 mEq/L (ref 96–112)
Creatinine, Ser: 1.01 mg/dL (ref 0.40–1.20)
GFR: 66.61 mL/min (ref >60.00)
Glucose, Bld: 89 mg/dL (ref 70–99)
Potassium: 4.3 mEq/L (ref 3.5–5.1)
Sodium: 140 mEq/L (ref 135–145)

## 2020-11-01 LAB — LIPID PANEL
Cholesterol: 166 mg/dL (ref 0–200)
HDL: 50.4 mg/dL (ref >39.00)
LDL Cholesterol: 102 mg/dL — ABNORMAL HIGH (ref 0–99)
NonHDL: 115.78
Total CHOL/HDL Ratio: 3
Triglycerides: 71 mg/dL (ref 0.0–149.0)
VLDL: 14.2 mg/dL (ref 0.0–40.0)

## 2020-11-01 LAB — TSH: TSH: 1.64 u[IU]/mL (ref 0.35–4.50)

## 2020-11-01 LAB — VITAMIN D 25 HYDROXY (VIT D DEFICIENCY, FRACTURES): VITD: 27.31 ng/mL — ABNORMAL LOW (ref 30.00–100.00)

## 2020-11-02 ENCOUNTER — Ambulatory Visit (HOSPITAL_COMMUNITY)
Admission: RE | Admit: 2020-11-02 | Discharge: 2020-11-02 | Disposition: A | Discharge status: Home / Self Care | Payer: BC Managed Care – PPO | Source: Ambulatory Visit | Attending: Internal Medicine | Admitting: Internal Medicine

## 2020-11-02 DIAGNOSIS — R413 Other amnesia: Secondary | ICD-10-CM | POA: Diagnosis not present

## 2020-11-02 DIAGNOSIS — R55 Syncope and collapse: Secondary | ICD-10-CM | POA: Insufficient documentation

## 2020-11-02 DIAGNOSIS — G43909 Migraine, unspecified, not intractable, without status migrainosus: Secondary | ICD-10-CM | POA: Diagnosis not present

## 2020-11-02 DIAGNOSIS — R269 Unspecified abnormalities of gait and mobility: Secondary | ICD-10-CM | POA: Diagnosis not present

## 2020-11-02 DIAGNOSIS — R42 Dizziness and giddiness: Secondary | ICD-10-CM | POA: Diagnosis not present

## 2020-11-02 DIAGNOSIS — R519 Headache, unspecified: Secondary | ICD-10-CM | POA: Diagnosis not present

## 2020-11-02 DIAGNOSIS — R4701 Aphasia: Secondary | ICD-10-CM | POA: Diagnosis not present

## 2020-11-02 MED ORDER — GADOBUTROL 1 MMOL/ML IV SOLN
10.0000 mL | Freq: Once | INTRAVENOUS | Status: AC | PRN
  Administered 2020-11-02: 10 mL via INTRAVENOUS

## 2020-11-05 ENCOUNTER — Telehealth: Payer: Self-pay | Admitting: Internal Medicine

## 2020-11-05 NOTE — Telephone Encounter (Signed)
Patient called in for results of CTscan

## 2020-11-06 ENCOUNTER — Other Ambulatory Visit: Payer: Self-pay | Admitting: Internal Medicine

## 2020-11-06 DIAGNOSIS — J321 Chronic frontal sinusitis: Secondary | ICD-10-CM

## 2020-11-06 MED ORDER — AZITHROMYCIN 250 MG PO TABS: ORAL_TABLET | ORAL | 0 refills | Status: DC

## 2020-11-06 NOTE — Telephone Encounter (Signed)
Patient informed and verbalized understanding

## 2020-11-08 ENCOUNTER — Telehealth: Payer: Self-pay | Admitting: Internal Medicine

## 2020-11-08 NOTE — Telephone Encounter (Signed)
lft vm for pt to call ofc regarding neurology referral. Thanks

## 2020-11-09 ENCOUNTER — Telehealth: Payer: Self-pay | Admitting: Internal Medicine

## 2020-11-09 NOTE — Telephone Encounter (Signed)
Diana Glow McLean-Scocuzza, MD  11/08/2020 4:06 PM EDT Back to [facility] neurology declined to see patient will need to go to [facility] if on longer [facility]    Left message to return call.

## 2020-11-18 NOTE — Addendum Note (Signed)
Addended by: Orland Mustard on: 11/18/2020 11:57 PM   Modules accepted: Orders

## 2020-11-21 ENCOUNTER — Telehealth: Payer: Self-pay | Admitting: Internal Medicine

## 2020-11-21 MED ORDER — PREDNISONE 10 MG PO TABS: ORAL_TABLET | ORAL | 0 refills | Status: DC

## 2020-11-21 MED ORDER — AMOXICILLIN-POT CLAVULANATE 875-125 MG PO TABS: 1.0000 | ORAL_TABLET | Freq: Two times a day (BID) | ORAL | 0 refills | Status: DC

## 2020-11-21 NOTE — Telephone Encounter (Signed)
augmentin twice daily for 10 days  And prednisone taper have been sent to [facility] for persistent sinus infection  PROBIOTIC IS STRONGLY ADVISED TO PREVENT COLITIS

## 2020-11-21 NOTE — Telephone Encounter (Signed)
Patient called and said the antibiotic that Dr. Olivia Mackie prescribe has not helped her at all and she feels she needs another round/different antibiotic.

## 2020-11-21 NOTE — Telephone Encounter (Signed)
Diana Glow McLean-Scocuzza, MD  11/02/2020  2:44 PM EDT      Age related changes on MRI  C4/C5 bulging disc with mild spinal canal narrowing  Trace b/l sinus thickening, mastoid effusion left could be related to sinus infection -any sinus pain or pressure  -agreeable to antibiotics?   Azithromycin was sent in for the Patient.   Please advise, no available appointments this week

## 2020-11-21 NOTE — Telephone Encounter (Signed)
Patient has been informed.

## 2020-11-22 DIAGNOSIS — K7581 Nonalcoholic steatohepatitis (NASH): Secondary | ICD-10-CM | POA: Diagnosis not present

## 2020-11-22 DIAGNOSIS — K581 Irritable bowel syndrome with constipation: Secondary | ICD-10-CM | POA: Diagnosis not present

## 2020-11-23 DIAGNOSIS — M62838 Other muscle spasm: Secondary | ICD-10-CM | POA: Diagnosis not present

## 2020-11-23 DIAGNOSIS — M5386 Other specified dorsopathies, lumbar region: Secondary | ICD-10-CM | POA: Diagnosis not present

## 2020-11-23 DIAGNOSIS — M25551 Pain in right hip: Secondary | ICD-10-CM | POA: Diagnosis not present

## 2020-11-23 DIAGNOSIS — M6281 Muscle weakness (generalized): Secondary | ICD-10-CM | POA: Diagnosis not present

## 2020-11-23 DIAGNOSIS — R278 Other lack of coordination: Secondary | ICD-10-CM | POA: Diagnosis not present

## 2020-11-23 DIAGNOSIS — M6289 Other specified disorders of muscle: Secondary | ICD-10-CM | POA: Diagnosis not present

## 2020-11-23 DIAGNOSIS — M25552 Pain in left hip: Secondary | ICD-10-CM | POA: Diagnosis not present

## 2020-11-23 DIAGNOSIS — R102 Pelvic and perineal pain: Secondary | ICD-10-CM | POA: Diagnosis not present

## 2020-12-24 DIAGNOSIS — F411 Generalized anxiety disorder: Secondary | ICD-10-CM | POA: Diagnosis not present

## 2020-12-26 ENCOUNTER — Telehealth: Payer: Self-pay | Admitting: Internal Medicine

## 2020-12-26 NOTE — Telephone Encounter (Signed)
Pt called back returning your call °

## 2020-12-26 NOTE — Telephone Encounter (Signed)
Pt would like a call back to discuss her Vit D

## 2020-12-26 NOTE — Telephone Encounter (Signed)
Left message to return call 

## 2020-12-26 NOTE — Telephone Encounter (Signed)
Patient returned office phone call. 

## 2020-12-27 NOTE — Telephone Encounter (Signed)
Patient states that the over the counter medication is not helping. States she is still symptomatic. She is wanting the prescription strength sent in. States she is taking a gummy vitamin D and a multivitamin with vitamin D unsure of exact dosage.   Vitamin D last checked 10/2020. Please advise

## 2020-12-27 NOTE — Telephone Encounter (Signed)
Do not likely gummy with sugar she can taking vitamin D3 2000 to 4000 IU daily tablet otc no need for prescription vitamin D is not low enough

## 2020-12-27 NOTE — Telephone Encounter (Signed)
Patient has appointment on 01/01/21 and would like blood work done to determine Vitamin D levels. Patient had only been taking 2000 units of vitamin D. Informed her to go up to 4000 as recommended and to do pill form not gummies.   Patient verbalized understanding

## 2020-12-28 DIAGNOSIS — M62838 Other muscle spasm: Secondary | ICD-10-CM | POA: Diagnosis not present

## 2020-12-28 DIAGNOSIS — M25551 Pain in right hip: Secondary | ICD-10-CM | POA: Diagnosis not present

## 2020-12-28 DIAGNOSIS — M25552 Pain in left hip: Secondary | ICD-10-CM | POA: Diagnosis not present

## 2020-12-28 DIAGNOSIS — R278 Other lack of coordination: Secondary | ICD-10-CM | POA: Diagnosis not present

## 2020-12-28 DIAGNOSIS — R102 Pelvic and perineal pain: Secondary | ICD-10-CM | POA: Diagnosis not present

## 2020-12-28 DIAGNOSIS — M6289 Other specified disorders of muscle: Secondary | ICD-10-CM | POA: Diagnosis not present

## 2020-12-28 DIAGNOSIS — M5386 Other specified dorsopathies, lumbar region: Secondary | ICD-10-CM | POA: Diagnosis not present

## 2020-12-28 DIAGNOSIS — M6281 Muscle weakness (generalized): Secondary | ICD-10-CM | POA: Diagnosis not present

## 2021-01-01 ENCOUNTER — Ambulatory Visit (INDEPENDENT_AMBULATORY_CARE_PROVIDER_SITE_OTHER): Payer: BC Managed Care – PPO | Admitting: Internal Medicine

## 2021-01-01 ENCOUNTER — Other Ambulatory Visit: Payer: Self-pay

## 2021-01-01 ENCOUNTER — Encounter: Payer: Self-pay | Admitting: Internal Medicine

## 2021-01-01 VITALS — BP 122/84 | HR 72 | Temp 96.1°F | Resp 14 | Ht 72.0 in | Wt 200.0 lb

## 2021-01-01 DIAGNOSIS — E559 Vitamin D deficiency, unspecified: Secondary | ICD-10-CM | POA: Diagnosis not present

## 2021-01-01 DIAGNOSIS — J329 Chronic sinusitis, unspecified: Secondary | ICD-10-CM | POA: Diagnosis not present

## 2021-01-01 DIAGNOSIS — E538 Deficiency of other specified B group vitamins: Secondary | ICD-10-CM | POA: Diagnosis not present

## 2021-01-01 DIAGNOSIS — R5383 Other fatigue: Secondary | ICD-10-CM

## 2021-01-01 DIAGNOSIS — D649 Anemia, unspecified: Secondary | ICD-10-CM

## 2021-01-01 DIAGNOSIS — H6983 Other specified disorders of Eustachian tube, bilateral: Secondary | ICD-10-CM | POA: Diagnosis not present

## 2021-01-01 LAB — CBC WITH DIFFERENTIAL/PLATELET
Basophils Absolute: 0 10*3/uL (ref 0.0–0.1)
Basophils Relative: 0.4 % (ref 0.0–3.0)
Eosinophils Absolute: 0 10*3/uL (ref 0.0–0.7)
Eosinophils Relative: 0.6 % (ref 0.0–5.0)
HCT: 38.2 % (ref 36.0–46.0)
Hemoglobin: 12.7 g/dL (ref 12.0–15.0)
Lymphocytes Relative: 28.4 % (ref 12.0–46.0)
Lymphs Abs: 2 10*3/uL (ref 0.7–4.0)
MCHC: 33.3 g/dL (ref 30.0–36.0)
MCV: 83.7 fl (ref 78.0–100.0)
Monocytes Absolute: 0.6 10*3/uL (ref 0.1–1.0)
Monocytes Relative: 8.6 % (ref 3.0–12.0)
Neutro Abs: 4.5 10*3/uL (ref 1.4–7.7)
Neutrophils Relative %: 62 % (ref 43.0–77.0)
Platelets: 345 10*3/uL (ref 150.0–400.0)
RBC: 4.56 Mil/uL (ref 3.87–5.11)
RDW: 13.9 % (ref 11.5–15.5)
WBC: 7.2 10*3/uL (ref 4.0–10.5)

## 2021-01-01 LAB — VITAMIN D 25 HYDROXY (VIT D DEFICIENCY, FRACTURES): VITD: 28.29 ng/mL — ABNORMAL LOW (ref 30.00–100.00)

## 2021-01-01 LAB — COMPREHENSIVE METABOLIC PANEL
ALT: 22 U/L (ref 0–35)
AST: 24 U/L (ref 0–37)
Albumin: 4.6 g/dL (ref 3.5–5.2)
Alkaline Phosphatase: 74 U/L (ref 39–117)
BUN: 13 mg/dL (ref 6–23)
CO2: 27 mEq/L (ref 19–32)
Calcium: 10.3 mg/dL (ref 8.4–10.5)
Chloride: 103 mEq/L (ref 96–112)
Creatinine, Ser: 0.9 mg/dL (ref 0.40–1.20)
GFR: 76.41 mL/min (ref >60.00)
Glucose, Bld: 86 mg/dL (ref 70–99)
Potassium: 4 mEq/L (ref 3.5–5.1)
Sodium: 139 mEq/L (ref 135–145)
Total Bilirubin: 1.2 mg/dL (ref 0.2–1.2)
Total Protein: 7.8 g/dL (ref 6.0–8.3)

## 2021-01-01 LAB — VITAMIN B12: Vitamin B-12: 420 pg/mL (ref 211–911)

## 2021-01-01 NOTE — Progress Notes (Signed)
Chief Complaint  Patient presents with  . Follow-up    2 month follow up    F/u  1 fatigue she is snoring a night fatigue worse x 2 months disc with neurology  2. C/w jet lag and has to use sudafed in the past to help with this and pressure in the ears C/o sinus pain around nose and tx'ed Abx in the past est with ent disc in the future with them  disc dramamine/bonine can try in the future, yawning and gum due to ears feeling muffled   Review of Systems  Constitutional: Positive for malaise/fatigue. Negative for weight loss.  HENT: Negative for hearing loss.   Eyes: Negative for blurred vision.  Respiratory: Negative for shortness of breath.   Cardiovascular: Negative for chest pain.  Gastrointestinal: Negative for abdominal pain.  Musculoskeletal: Negative for falls and joint pain.  Skin: Negative for rash.  Neurological: Negative for headaches.  Psychiatric/Behavioral: Negative for depression.   Past Medical History:  Diagnosis Date  . Abdominal pain, epigastric 12/17/2015  . Anxiety    pt denies having dx of anxiety  . BCC (basal cell carcinoma of skin)    right foot   . Bunion, right foot   . Chronic cough   . Closed fibular fracture 10/2008   Left - Sustained 2/2 fall down stairs (same fall as tibular fracture). S/P internal fixation.  . Duodenitis    EGD (07/14/2011) showing chronic duodenitis, H.pyloir neg  . Elevated liver enzymes   . Endometriosis    S/P TAH and salpingo-oophorectomy, right  . Epigastric pain    Chronic, thought 2/2 gastritis, EGD (07/14/2011) showing chronic duodenitis, H.pyloir neg // Repeat EGD (09/2011) - esophagealring, sessible polyp in stomach body, hiatal hernia - rec ad probiotic, await bx  . Esophageal ring    s/p dilatation during EGD (07/2011) - Dr. Oneida Alar  . Fatty liver   . Gastritis    Thought due to chronic NSAID use 2/2 pain from her congenital hip abnormality  . Headache(784.0)    Migraines  . Hidradenitis suppurativa 05/2006   . 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 With chronic OA of hip  . History of migraine headaches    Typical symptoms - bright lights, unilateral (starting behind her left ear), throbbing .  Marland Kitchen Hot flashes   . Hypertension   . Internal hemorrhoids   . Osteoarthritis of hip   . Ovarian cancer ([place]) 1995  . Protrusio acetabuli    diagnosed at age 37  . Sessile colonic polyp    noted 09/2011 -- > rec colonoscopy in 10 years, 2023  . Surgical menopause 08/2007   On HRT. Occuring since 01/09 following TAH and R-salpingo-oophorectomy   . Tibial fracture 10/2008   Left - Sustained 2/2 fall down stairs. Patient is S/P closed internal fixation with Smith Nephew tibial nail locked proximal and distal  . Vitamin D deficiency 11/2009   Vit D level 15 in 11/2009   Past Surgical History:  Procedure Laterality Date  . ABDOMINAL HYSTERECTOMY    . CESAREAN SECTION  08/2005   Primary low transverse  . COLONOSCOPY  09/22/2011   EXH:BZJIRCV polyp in the rectum/Internal hemorrhoids, benign path  . ESOPHAGOGASTRODUODENOSCOPY  09/22/2011   ELF:YBOF, esophageal/Sessile polyp in the body of the stomach/Hiatal hernia/ABDOMINAL PAIN LIKELY FUNCTIONAL V. POST-INFECTIOUS IBS, path: mild chronic gastritis  . FRACTURE SURGERY     Right hip  . JOINT REPLACEMENT  right hip  . LAPAROSCOPY  10/2000   Operative laparoscopy with lysis of right adnexal adhesions and uterointestinal adhesions  . left tib fib    . OTHER SURGICAL HISTORY  02/2009   Closed treatment internal fixation, left tibia with Tamala Julian and Nephew tibial nail locked proximal and distal  . TOTAL ABDOMINAL HYSTERECTOMY W/ BILATERAL SALPINGOOPHORECTOMY  08/2007   for endometriosis. With concurrent right salpingo-oophorectomy  (2009), left oophorectomy (1995)  . TUBAL LIGATION  08/2005   Right tubal ligation   Family History  Problem Relation Age of Onset  . Other Father         deceased while in service.   . Breast cancer Sister 61       remission  . Cancer Sister        breast  . Lupus Cousin   . Other Mother        unknown medical history  . Dementia Maternal Grandmother        at old age  . Parkinson's disease Maternal Grandfather   . Multiple sclerosis Other        uncle   . Colon cancer Neg Hx   . Liver disease Neg Hx   . Migraines Neg Hx    Social History   Socioeconomic History  . Marital status: Married    Spouse name: Not on file  . Number of children: 2  . Years of education: bs degree  . Highest education level: Not on file  Occupational History  . 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 Smoking status: Never Smoker  . Smokeless tobacco: Never Used  Vaping Use  . Vaping Use: Never used  Substance and Sexual Activity  . Alcohol use: No    Alcohol/week: 0.0 standard drinks  . Drug use: No  . Sexual activity: Not on file  Other Topics Concern  . Not on file  Social History Narrative   Insurance: Medicare.   Patient is married with two children, Madison and Tamera Punt (son 89 y.o as of 03/22/20 going to [employer]).    She lives in [place] but children go to school in [place]    Right handed   Caffeine: no   Social Determinants of Health   Financial Resource Strain: Not on file  Food Insecurity: Not on file  Transportation Needs: Not on file  Physical Activity: Not on file  Stress: Not on file  Social Connections: Not on file  Intimate Partner Violence: Not on file   Current Meds  Medication Sig  . amLODipine (NORVASC) 10 MG tablet Take 1 tablet (10 mg total) by mouth daily.  . Multiple Vitamin (MULTIVITAMIN PO) Take by mouth.  Marland Kitchen VITAMIN E PO Take by mouth.   Allergies  Allergen Reactions  . Pineapple Shortness Of Breath and Swelling    Has Epi pen  . Eggs Or Egg-Derived Products     GI issues   . Hctz [Hydrochlorothiazide]     Headaches, foggy headed    . Latex Swelling  .  Lisinopril     Cough   . Morphine And Related Itching  . Tape Swelling   Recent Results (from the past 2160 hour(s))  Basic Metabolic Panel (BMET)     Status: None   Collection Time: 11/01/20  8:23 AM  Result Value Ref Range   Sodium 140 135 - 145 mEq/L   Potassium 4.3 3.5 - 5.1 mEq/L   Chloride 105 96 - 112 mEq/L  CO2 28 19 - 32 mEq/L   Glucose, Bld 89 70 - 99 mg/dL   BUN 13 6 - 23 mg/dL   Creatinine, Ser 1.01 0.40 - 1.20 mg/dL   GFR 66.61 >60.00 mL/min    Comment: Calculated using the CKD-EPI Creatinine Equation (2021)   Calcium 9.6 8.4 - 10.5 mg/dL  Urinalysis, Routine w reflex microscopic     Status: Abnormal   Collection Time: 11/01/20  8:23 AM  Result Value Ref Range   Color, Urine YELLOW Yellow;Lt. Yellow;Straw;Dark Yellow;Amber;Green;Red;Brown   APPearance CLEAR Clear;Turbid;Slightly Cloudy;Cloudy   Specific Gravity, Urine 1.015 1.000 - 1.030   pH 6.0 5.0 - 8.0   Total Protein, Urine NEGATIVE Negative   Urine Glucose NEGATIVE Negative   Ketones, ur NEGATIVE Negative   Bilirubin Urine NEGATIVE Negative   Hgb urine dipstick NEGATIVE Negative   Urobilinogen, UA 0.2 0.0 - 1.0   Leukocytes,Ua SMALL (A) Negative   Nitrite NEGATIVE Negative   WBC, UA 3-6/hpf (A) 0-2/hpf   RBC / HPF 0-2/hpf 0-2/hpf   Mucus, UA Presence of (A) None   Squamous Epithelial / LPF Rare(0-4/hpf) Rare(0-4/hpf)  TSH     Status: None   Collection Time: 11/01/20  8:23 AM  Result Value Ref Range   TSH 1.64 0.35 - 4.50 uIU/mL  Lipid panel     Status: Abnormal   Collection Time: 11/01/20  8:23 AM  Result Value Ref Range   Cholesterol 166 0 - 200 mg/dL    Comment: ATP III Classification       Desirable:  < 200 mg/dL               Borderline High:  200 - 239 mg/dL          High:  > = 240 mg/dL   Triglycerides 71.0 0.0 - 149.0 mg/dL    Comment: Normal:  <150 mg/dLBorderline High:  150 - 199 mg/dL   HDL 50.40 >39.00 mg/dL   VLDL 14.2 0.0 - 40.0 mg/dL   LDL Cholesterol 102 (H) 0 - 99 mg/dL   Total  CHOL/HDL Ratio 3     Comment:                Men          Women1/2 Average Risk     3.4          3.3Average Risk          5.0          4.42X Average Risk          9.6          7.13X Average Risk          15.0          11.0                       NonHDL 115.78     Comment: NOTE:  Non-HDL goal should be 30 mg/dL higher than patient's LDL goal (i.e. LDL goal of < 70 mg/dL, would have non-HDL goal of < 100 mg/dL)  Hemoglobin A1c     Status: None   Collection Time: 11/01/20  8:23 AM  Result Value Ref Range   Hgb A1c MFr Bld 6.2 4.6 - 6.5 %    Comment: Glycemic Control Guidelines for People with Diabetes:Non Diabetic:  <6%Goal of Therapy: <7%Additional Action Suggested:  >8%   Vitamin D (25 hydroxy)     Status: Abnormal   Collection Time: 11/01/20  8:23 AM  Result Value Ref Range   VITD 27.31 (L) 30.00 - 100.00 ng/mL  CBC with Differential/Platelet     Status: Abnormal   Collection Time: 11/01/20  8:23 AM  Result Value Ref Range   WBC 8.1 4.0 - 10.5 K/uL   RBC 4.29 3.87 - 5.11 Mil/uL   Hemoglobin 11.8 (L) 12.0 - 15.0 g/dL   HCT 35.5 (L) 36.0 - 46.0 %   MCV 82.7 78.0 - 100.0 fl   MCHC 33.3 30.0 - 36.0 g/dL   RDW 14.7 11.5 - 15.5 %   Platelets 296.0 150.0 - 400.0 K/uL   Neutrophils Relative % 68.0 43.0 - 77.0 %   Lymphocytes Relative 21.8 12.0 - 46.0 %   Monocytes Relative 8.2 3.0 - 12.0 %   Eosinophils Relative 1.7 0.0 - 5.0 %   Basophils Relative 0.3 0.0 - 3.0 %   Neutro Abs 5.5 1.4 - 7.7 K/uL   Lymphs Abs 1.8 0.7 - 4.0 K/uL   Monocytes Absolute 0.7 0.1 - 1.0 K/uL   Eosinophils Absolute 0.1 0.0 - 0.7 K/uL   Basophils Absolute 0.0 0.0 - 0.1 K/uL   Objective  Body mass index is 27.12 kg/m. Wt Readings from Last 3 Encounters:  01/01/21 200 lb (90.7 kg)  10/30/20 204 lb 3.2 oz (92.6 kg)  05/16/20 203 lb 3.2 oz (92.2 kg)   Temp Readings from Last 3 Encounters:  01/01/21 (!) 96.1 F (35.6 C) (Temporal)  10/30/20 98.7 F (37.1 C) (Oral)  05/16/20 98.2 F (36.8 C)   BP Readings  from Last 3 Encounters:  01/01/21 122/84  10/30/20 122/76  05/16/20 (!) 142/108   Pulse Readings from Last 3 Encounters:  01/01/21 72  10/30/20 60  05/16/20 78    Physical Exam Vitals and nursing note reviewed.  Constitutional:      Appearance: Normal appearance. She is well-developed, well-groomed and overweight.  HENT:     Head: Normocephalic and atraumatic.  Cardiovascular:     Rate and Rhythm: Normal rate and regular rhythm.     Heart sounds: Normal heart sounds. No murmur heard.   Pulmonary:     Effort: Pulmonary effort is normal.     Breath sounds: Normal breath sounds.  Skin:    General: Skin is warm and dry.  Neurological:     General: No focal deficit present.     Mental Status: She is alert and oriented to person, place, and time. Mental status is at baseline.     Gait: Gait normal.  Psychiatric:        Attention and Perception: Attention and perception normal.        Mood and Affect: Mood and affect normal.        Speech: Speech normal.        Behavior: Behavior normal. Behavior is cooperative.        Thought Content: Thought content normal.        Cognition and Memory: Cognition and memory normal.        Judgment: Judgment normal.     Assessment  Plan  Fatigue, unspecified type - Plan: Iron, TIBC and Ferritin Panel, B12, Comprehensive metabolic panel, CBC with Differential/Platelet Consider neurology for sleep study upcoming as she snores if not consider pulmonary   Vitamin D deficiency - Plan: Vitamin D (25 hydroxy) Iron, TIBC and Ferritin Panel Plan: B12   ETD with flying and h/o sinusitis  F/u ent in [place]  disc dramamine/bonine can try in the future, yawning and  gum due to ears feeling muffled Sudafed 3 days or less if needed  Ns, flonase otc ah   hM Flu shot utd Hep B immune, consider hep A vaccineh/o fatty liver Tdap up to date10/26/17 pna 23 had 02/19/09 HCV neg 06/05/16  HIV neg 06/05/16 covid 3/3  mammo 06/20/18 negreferred  again [facility] with [facility] -[facility] 11/2021had mammo and dexa  Get copy of pap [facility] [place] saw 2019 s/p hysterectomyfor endometriosis in 2007 prior [facility] in 1995 for ovarian issues per OB/GYN notes 05/2018 [facility] -no rec paps per OB/GYN no h/o abnormals per note 05/14/18 ob/gyn   [facility] colon[facility] and utd no issues chronic IBS-C   07/30/2020 Office Visit [facility]  [address]  [place]  [place], [place] [address]  [phone]  Dr Redmond Baseman [place]  [place], [place] [address]  [phone] (Work)  [phone] (Fax)      01/04/2021 Treatment Physical Therapy Eveline Keto, [place]  [place], [place] [address]  [phone] (Work)    01/09/2021 Office Visit Dermatology [place], [place], [place], [place] [address]  [phone] (Work)  [phone] (Fax)    01/11/2021 Treatment Physical Therapy Eveline Keto, [place]  [place], [place] [address]  [phone] (Work)    01/29/2021 Office Visit Gastroenterology Adela Ports, [place], [place] [address]  [phone] (Work)  [phone] (Fax)    01/30/2021 Initial consult Neurology Timoteo Gaul, [place], [place] [address]  [phone] (Work)  [phone] (Fax)    02/14/2021 Office Visit Orthopedic Surgery Marygrace Dr[place], [place]  [place],  [address]  [phone] (Work)  [phone] (Fax)       Provider: Dr. Olivia Mackie McLean-Scocuzza-Internal Medicine

## 2021-01-01 NOTE — Patient Instructions (Addendum)
For flying Nasal saline then flonase 2 sprays AND allergy pills claritin, allegra, zyrtec, xyzal at night  Bonnine/dramamine chewable over the counter   07/30/2020 Office Visit [place]  [address]  [place]  [place], [place] [address]  [phone]  Dr Redmond Baseman [address]  [place], [place] [address]  [phone] (Work)  [phone] (Fax)      01/04/2021 Treatment Physical Therapy Eveline Keto, [place]  [place], [place] [address]  [phone] (Work)    01/09/2021 Office Visit Dermatology [place], Flonnie Hailstone, MD  [place], [place] [address]  [phone] (Work)  [phone] (Fax)    01/11/2021 Treatment Physical Therapy Eveline Keto, [place]  [place], [place] [address]  [phone] (Work)    01/29/2021 Office Visit Gastroenterology Adela Ports, [place], [place] [address]  [phone] (Work)  [phone] (Fax)    01/30/2021 Initial consult Neurology Timoteo Gaul, [place], [place] [address]  [phone] (Work)  [phone] (Fax)    02/14/2021 Office Visit Orthopedic Surgery Marygrace Drought, MD  [address]  [place], [place] [address]  [phone] (Work)  [phone] (Fax)     Sinusitis, Adult Sinusitis is inflammation of your sinuses. Sinuses are hollow spaces in the bones around your face. Your sinuses are located:  Around your eyes.  In the middle of your forehead.  Behind your nose.  In your cheekbones. Mucus normally drains out of your sinuses. When your nasal tissues become inflamed or swollen, mucus can become trapped or blocked. This allows bacteria, viruses, and fungi to grow, which leads to infection. Most infections of the sinuses are caused by a virus. Sinusitis can develop quickly. It can last for  up to 4 weeks (acute) or for more than 12 weeks (chronic). Sinusitis often develops after a cold. What are the causes? This condition is caused by anything that creates swelling in the sinuses or stops mucus from draining. This includes:  Allergies.  Asthma.  Infection from bacteria or viruses.  Deformities or blockages in your nose or sinuses.  Abnormal growths in the nose (nasal polyps).  Pollutants, such as chemicals or irritants in the air.  Infection from fungi (rare). What increases the risk? You are more likely to develop this condition if you:  Have a weak body defense system (immune system).  Do a lot of swimming or diving.  Overuse nasal sprays.  Smoke. What are the signs or symptoms? The main symptoms of this condition are pain and a feeling of pressure around the affected sinuses. Other symptoms include:  Stuffy nose or congestion.  Thick drainage from your nose.  Swelling and warmth over the affected sinuses.  Headache.  Upper toothache.  A cough that may get worse at night.  Extra mucus that collects in the throat or the back of the nose (postnasal drip).  Decreased sense of smell and taste.  Fatigue.  A fever.  Sore throat.  Bad breath. How is this diagnosed? This condition is diagnosed based on:  Your symptoms.  Your medical history.  A physical exam.  Tests to find out if your condition is acute or chronic. This may include: ? Checking your nose for nasal polyps. ? Viewing your sinuses using a device that has a light (endoscope). ? Testing for allergies or bacteria. ? Imaging tests, such as an  MRI or CT scan. In rare cases, a bone biopsy may be done to rule out more serious types of fungal sinus disease. How is this treated? Treatment for sinusitis depends on the cause and whether your condition is chronic or acute.  If caused by a virus, your symptoms should go away on their own within 10 days. You may be given medicines to  relieve symptoms. They include: ? Medicines that shrink swollen nasal passages (topical intranasal decongestants). ? Medicines that treat allergies (antihistamines). ? A spray that eases inflammation of the nostrils (topical intranasal corticosteroids). ? Rinses that help get rid of thick mucus in your nose (nasal saline washes).  If caused by bacteria, your health care provider may recommend waiting to see if your symptoms improve. Most bacterial infections will get better without antibiotic medicine. You may be given antibiotics if you have: ? A severe infection. ? A weak immune system.  If caused by narrow nasal passages or nasal polyps, you may need to have surgery. Follow these instructions at home: Medicines  Take, use, or apply over-the-counter and prescription medicines only as told by your health care provider. These may include nasal sprays.  If you were prescribed an antibiotic medicine, take it as told by your health care provider. Do not stop taking the antibiotic even if you start to feel better. Hydrate and humidify  Drink enough fluid to keep your urine pale yellow. Staying hydrated will help to thin your mucus.  Use a cool mist humidifier to keep the humidity level in your home above 50%.  Inhale steam for 10-15 minutes, 3-4 times a day, or as told by your health care provider. You can do this in the bathroom while a hot shower is running.  Limit your exposure to cool or dry air.   Rest  Rest as much as possible.  Sleep with your head raised (elevated).  Make sure you get enough sleep each night. General instructions  Apply a warm, moist washcloth to your face 3-4 times a day or as told by your health care provider. This will help with discomfort.  Wash your hands often with soap and water to reduce your exposure to germs. If soap and water are not available, use hand sanitizer.  Do not smoke. Avoid being around people who are smoking (secondhand  smoke).  Keep all follow-up visits as told by your health care provider. This is important.   Contact a health care provider if:  You have a fever.  Your symptoms get worse.  Your symptoms do not improve within 10 days. Get help right away if:  You have a severe headache.  You have persistent vomiting.  You have severe pain or swelling around your face or eyes.  You have vision problems.  You develop confusion.  Your neck is stiff.  You have trouble breathing. Summary  Sinusitis is soreness and inflammation of your sinuses. Sinuses are hollow spaces in the bones around your face.  This condition is caused by nasal tissues that become inflamed or swollen. The swelling traps or blocks the flow of mucus. This allows bacteria, viruses, and fungi to grow, which leads to infection.  If you were prescribed an antibiotic medicine, take it as told by your health care provider. Do not stop taking the antibiotic even if you start to feel better.  Keep all follow-up visits as told by your health care provider. This is important. This information is not intended to replace advice given to you by  your health care provider. Make sure you discuss any questions you have with your health care provider. Document Revised: 12/28/2017 Document Reviewed: 12/28/2017 Elsevier Patient Education  2021 [place].   Fatigue If you have fatigue, you feel tired all the time and have a lack of energy or a lack of motivation. Fatigue may make it difficult to start or complete tasks because of exhaustion. In general, occasional or mild fatigue is often a normal response to activity or life. However, long-lasting (chronic) or extreme fatigue may be a symptom of a medical condition. Follow these instructions at home: General instructions  Watch your fatigue for any changes.  Go to bed and get up at the same time every day.  Avoid fatigue by pacing yourself during the day and getting enough sleep at  night.  Maintain a healthy weight. Medicines  Take over-the-counter and prescription medicines only as told by your health care provider.  Take a multivitamin, if told by your health care provider.  Do not use herbal or dietary supplements unless they are approved by your health care provider. Activity  Exercise regularly, as told by your health care provider.  Use or practice techniques to help you relax, such as yoga, tai chi, meditation, or massage therapy.   Eating and drinking  Avoid heavy meals in the evening.  Eat a well-balanced diet, which includes lean proteins, whole grains, plenty of fruits and vegetables, and low-fat dairy products.  Avoid consuming too much caffeine.  Avoid the use of alcohol.  Drink enough fluid to keep your urine pale yellow.   Lifestyle  Change situations that cause you stress. Try to keep your work and personal schedule in balance.  Do not use any products that contain nicotine or tobacco, such as cigarettes and e-cigarettes. If you need help quitting, ask your health care provider.  Do not use drugs. Contact a health care provider if:  Your fatigue does not get better.  You have a fever.  You suddenly lose or gain weight.  You have headaches.  You have trouble falling asleep or sleeping through the night.  You feel angry, guilty, anxious, or sad.  You are unable to have a bowel movement (constipation).  Your skin is dry.  You have swelling in your legs or another part of your body. Get help right away if:  You feel confused.  Your vision is blurry.  You feel faint or you pass out.  You have a severe headache.  You have severe pain in your abdomen, your back, or the area between your waist and hips (pelvis).  You have chest pain, shortness of breath, or an irregular or fast heartbeat.  You are unable to urinate, or you urinate less than normal.  You have abnormal bleeding, such as bleeding from the rectum, vagina,  nose, lungs, or nipples.  You vomit blood.  You have thoughts about hurting yourself or others. If you ever feel like you may hurt yourself or others, or have thoughts about taking your own life, get help right away. You can go to your nearest emergency department or call:  Your local emergency services (911 in the [place]  A suicide crisis helpline, such as the [facility] at 774-644-3601. This is open 24 hours a day. Summary  If you have fatigue, you feel tired all the time and have a lack of energy or a lack of motivation.  Fatigue may make it difficult to start or complete tasks because of exhaustion.  Long-lasting (chronic) or extreme fatigue may be a symptom of a medical condition.  Exercise regularly, as told by your health care provider.  Change situations that cause you stress. Try to keep your work and personal schedule in balance. This information is not intended to replace advice given to you by your health care provider. Make sure you discuss any questions you have with your health care provider. Document Revised: 02/16/2019 Document Reviewed: 04/22/2017 Elsevier Patient Education  2021 [place].   Eustachian Tube Dysfunction  Eustachian tube dysfunction refers to a condition in which a blockage develops in the narrow passage that connects the middle ear to the back of the nose (eustachian tube). The eustachian tube regulates air pressure in the middle ear by letting air move between the ear and nose. It also helps to drain fluid from the middle ear space. Eustachian tube dysfunction can affect one or both ears. When the eustachian tube does not function properly, air pressure, fluid, or both can build up in the middle ear. What are the causes? This condition occurs when the eustachian tube becomes blocked or cannot open normally. Common causes of this condition include:  Ear infections.  Colds and other infections that affect the nose,  mouth, and throat (upper respiratory tract).  Allergies.  Irritation from cigarette smoke.  Irritation from stomach acid coming up into the esophagus (gastroesophageal reflux). The esophagus is the tube that carries food from the mouth to the stomach.  Sudden changes in air pressure, such as from descending in an airplane or scuba diving.  Abnormal growths in the nose or throat, such as: ? Growths that line the nose (nasal polyps). ? Abnormal growth of cells (tumors). ? Enlarged tissue at the back of the throat (adenoids). What increases the risk? You are more likely to develop this condition if:  You smoke.  You are overweight.  You are a child who has: ? Certain birth defects of the mouth, such as cleft palate. ? Large tonsils or adenoids. What are the signs or symptoms? Common symptoms of this condition include:  A feeling of fullness in the ear.  Ear pain.  Clicking or popping noises in the ear.  Ringing in the ear.  Hearing loss.  Loss of balance.  Dizziness. Symptoms may get worse when the air pressure around you changes, such as when you travel to an area of high elevation, fly on an airplane, or go scuba diving. How is this diagnosed? This condition may be diagnosed based on:  Your symptoms.  A physical exam of your ears, nose, and throat.  Tests, such as those that measure: ? The movement of your eardrum (tympanogram). ? Your hearing (audiometry). How is this treated? Treatment depends on the cause and severity of your condition.  In mild cases, you may relieve your symptoms by moving air into your ears. This is called "popping the ears."  In more severe cases, or if you have symptoms of fluid in your ears, treatment may include: ? Medicines to relieve congestion (decongestants). ? Medicines that treat allergies (antihistamines). ? Nasal sprays or ear drops that contain medicines that reduce swelling (steroids). ? A procedure to drain the fluid in  your eardrum (myringotomy). In this procedure, a small tube is placed in the eardrum to:  Drain the fluid.  Restore the air in the middle ear space. ? A procedure to insert a balloon device through the nose to inflate the opening of the eustachian tube (balloon dilation). Follow these instructions  at home: Lifestyle  Do not do any of the following until your health care provider approves: ? Travel to high altitudes. ? Fly in airplanes. ? Work in a Pension scheme manager or room. ? Scuba dive.  Do not use any products that contain nicotine or tobacco, such as cigarettes and e-cigarettes. If you need help quitting, ask your health care provider.  Keep your ears dry. Wear fitted earplugs during showering and bathing. Dry your ears completely after. General instructions  Take over-the-counter and prescription medicines only as told by your health care provider.  Use techniques to help pop your ears as recommended by your health care provider. These may include: ? Chewing gum. ? Yawning. ? Frequent, forceful swallowing. ? Closing your mouth, holding your nose closed, and gently blowing as if you are trying to blow air out of your nose.  Keep all follow-up visits as told by your health care provider. This is important. Contact a health care provider if:  Your symptoms do not go away after treatment.  Your symptoms come back after treatment.  You are unable to pop your ears.  You have: ? A fever. ? Pain in your ear. ? Pain in your head or neck. ? Fluid draining from your ear.  Your hearing suddenly changes.  You become very dizzy.  You lose your balance. Summary  Eustachian tube dysfunction refers to a condition in which a blockage develops in the eustachian tube.  It can be caused by ear infections, allergies, inhaled irritants, or abnormal growths in the nose or throat.  Symptoms include ear pain, hearing loss, or ringing in the ears.  Mild cases are treated with  maneuvers to unblock the ears, such as yawning or ear popping.  Severe cases are treated with medicines. Surgery may also be done (rare). This information is not intended to replace advice given to you by your health care provider. Make sure you discuss any questions you have with your health care provider. Document Revised: 11/17/2017 Document Reviewed: 11/17/2017 Elsevier Patient Education  [place].

## 2021-01-02 LAB — IRON,TIBC AND FERRITIN PANEL
%SAT: 26 % (calc) (ref 16–45)
Ferritin: 90 ng/mL (ref 16–232)
Iron: 98 ug/dL (ref 40–190)
TIBC: 377 mcg/dL (calc) (ref 250–450)

## 2021-01-04 DIAGNOSIS — M25552 Pain in left hip: Secondary | ICD-10-CM | POA: Diagnosis not present

## 2021-01-04 DIAGNOSIS — M62838 Other muscle spasm: Secondary | ICD-10-CM | POA: Diagnosis not present

## 2021-01-04 DIAGNOSIS — R102 Pelvic and perineal pain: Secondary | ICD-10-CM | POA: Diagnosis not present

## 2021-01-04 DIAGNOSIS — M25551 Pain in right hip: Secondary | ICD-10-CM | POA: Diagnosis not present

## 2021-01-04 DIAGNOSIS — M6289 Other specified disorders of muscle: Secondary | ICD-10-CM | POA: Diagnosis not present

## 2021-01-04 DIAGNOSIS — M6281 Muscle weakness (generalized): Secondary | ICD-10-CM | POA: Diagnosis not present

## 2021-01-04 DIAGNOSIS — M5386 Other specified dorsopathies, lumbar region: Secondary | ICD-10-CM | POA: Diagnosis not present

## 2021-01-04 DIAGNOSIS — R278 Other lack of coordination: Secondary | ICD-10-CM | POA: Diagnosis not present

## 2021-01-08 ENCOUNTER — Other Ambulatory Visit: Payer: Self-pay | Admitting: Internal Medicine

## 2021-01-08 DIAGNOSIS — L732 Hidradenitis suppurativa: Secondary | ICD-10-CM

## 2021-01-09 DIAGNOSIS — L732 Hidradenitis suppurativa: Secondary | ICD-10-CM | POA: Diagnosis not present

## 2021-01-09 DIAGNOSIS — L811 Chloasma: Secondary | ICD-10-CM | POA: Diagnosis not present

## 2021-01-11 DIAGNOSIS — R278 Other lack of coordination: Secondary | ICD-10-CM | POA: Diagnosis not present

## 2021-01-21 DIAGNOSIS — F411 Generalized anxiety disorder: Secondary | ICD-10-CM | POA: Diagnosis not present

## 2021-01-22 ENCOUNTER — Other Ambulatory Visit: Payer: Self-pay

## 2021-01-22 ENCOUNTER — Ambulatory Visit
Admission: RE | Admit: 2021-01-22 | Discharge: 2021-01-22 | Disposition: A | Discharge status: Home / Self Care | Payer: BC Managed Care – PPO | Source: Ambulatory Visit | Attending: Family Medicine | Admitting: Family Medicine

## 2021-01-22 VITALS — BP 150/78 | HR 95 | Temp 98.8°F | Resp 18

## 2021-01-22 DIAGNOSIS — N3090 Cystitis, unspecified without hematuria: Secondary | ICD-10-CM | POA: Diagnosis not present

## 2021-01-22 DIAGNOSIS — J029 Acute pharyngitis, unspecified: Secondary | ICD-10-CM | POA: Diagnosis not present

## 2021-01-22 DIAGNOSIS — J069 Acute upper respiratory infection, unspecified: Secondary | ICD-10-CM | POA: Diagnosis not present

## 2021-01-22 DIAGNOSIS — R509 Fever, unspecified: Secondary | ICD-10-CM | POA: Diagnosis not present

## 2021-01-22 DIAGNOSIS — I10 Essential (primary) hypertension: Secondary | ICD-10-CM | POA: Diagnosis not present

## 2021-01-22 DIAGNOSIS — R3915 Urgency of urination: Secondary | ICD-10-CM | POA: Insufficient documentation

## 2021-01-22 LAB — POCT URINALYSIS DIP (MANUAL ENTRY)
Bilirubin, UA: NEGATIVE
Glucose, UA: NEGATIVE mg/dL
Ketones, POC UA: NEGATIVE mg/dL
Nitrite, UA: NEGATIVE
Protein Ur, POC: NEGATIVE mg/dL
Spec Grav, UA: 1.015 (ref 1.010–1.025)
Urobilinogen, UA: 0.2 E.U./dL
pH, UA: 6 (ref 5.0–8.0)

## 2021-01-22 LAB — POCT RAPID STREP A (OFFICE): Rapid Strep A Screen: NEGATIVE

## 2021-01-22 MED ORDER — CEPHALEXIN 500 MG PO CAPS: 500.0000 mg | ORAL_CAPSULE | Freq: Two times a day (BID) | ORAL | 0 refills | Status: DC

## 2021-01-22 MED ORDER — PREDNISONE 20 MG PO TABS
40.0000 mg | ORAL_TABLET | Freq: Every day | ORAL | 0 refills | Status: DC
Start: 2021-01-22 — End: 2021-03-13

## 2021-01-22 NOTE — ED Triage Notes (Signed)
Sore throat, fever, joint pain and lower back pain since Sunday.  Does report urinary frequency as well.

## 2021-01-22 NOTE — ED Provider Notes (Signed)
[place]   [id number] 01/22/21 Arrival Time: 7616  ASSESSMENT & PLAN:  1. Fever, unspecified fever cause   2. Viral URI   3. Sore throat   4. Urinary urgency   5. Uncontrolled hypertension   6. Cystitis     Discussed typical duration of viral URI. Rapid strep negative. Culture sent. U/A: + leuks. Culture sent. COVID-19 testing sent. OTC symptom care as needed.  With significant throat inflammation, would like trial of : Meds ordered this encounter  Medications   predniSONE (DELTASONE) 20 MG tablet    Sig: Take 2 tablets (40 mg total) by mouth daily.    Dispense:  10 tablet    Refill:  0   For UTI, begin: Meds ordered this encounter  Medications   cephALEXin (KEFLEX) 500 MG capsule    Sig: Take 1 capsule (500 mg total) by mouth 2 (two) times daily.    Dispense:  10 capsule    Refill:  0      Discharge Instructions      You have been tested for COVID-19 today. If your test returns positive, you will receive a phone call from [facility] regarding your results. Negative test results are not called. Both positive and negative results area always visible on MyChart. If you do not have a MyChart account, sign up instructions are provided in your discharge papers. Please do not hesitate to contact us should you have questions or concerns.  Your blood pressure was noted to be elevated during your visit today. If you are currently taking medication for high blood pressure, please ensure you are taking this as directed. If you do not have a history of high blood pressure and your blood pressure remains persistently elevated, you may need to begin taking a medication at some point. You may return here within the next few days to recheck if unable to see your primary care provider or if you do not have a one.  BP (!) 150/78 (BP Location: Right Arm)   Pulse 95   Temp 98.8 F (37.1 C) (Oral)   Resp 18   SpO2 94%   BP Readings from Last 3 Encounters:   01/22/21 (!) 150/78  01/01/21 122/84  10/30/20 122/76        Recommend:  Follow-up Information     Schedule an appointment as soon as possible for a visit  with McLean-Scocuzza, Nino Glow, MD.   Specialty: Internal Medicine Why: To recheck your blood pressure. Contact information: [place] [address] [phone]                  Reviewed expectations re: course of current medical issues. Questions answered. Outlined signs and symptoms indicating need for more acute intervention. Understanding verbalized. After Visit Summary given.   SUBJECTIVE: History from: patient. [name] is a 45 y.o. female who presents with worries regarding COVID-19. Known COVID-19 contact: none. Recent travel: none. Reports: ST, subj fever, joint aches, LBP, fatigue; abrupt onset 3 d ago. Denies: difficulty breathing. Normal PO intake without n/v/d. Also with one episode of urinary urgency this am; no dysuria; no hematuria.  Increased blood pressure noted today. She is treated for HTN. She reports taking medications as instructed, no chest pain on exertion, no dyspnea on exertion, no swelling of ankles, and no orthostatic dizziness or lightheadedness.   OBJECTIVE:  Vitals:   01/22/21 1023  BP: (!) 150/78  Pulse: 95  Resp: 18  Temp: 98.8 F (37.1 C)  TempSrc: Oral  SpO2: 94%    General appearance: alert; no distress Eyes: PERRLA; EOMI; conjunctiva normal HENT: [place]; AT; with mild nasal congestion; throat with significant erythema; no exudates; uvula midline Neck: supple  Lungs: speaks full sentences without difficulty; unlabored Extremities: no edema Skin: warm and dry Neurologic: normal gait Psychological: alert and cooperative; normal mood and affect  Labs: Results for orders placed or performed during the hospital encounter of 01/22/21  POCT rapid strep A  Result Value Ref Range   Rapid Strep A Screen Negative Negative   Labs Reviewed   COVID-19, FLU A+B NAA  CULTURE, GROUP A STREP [facility])  POCT RAPID STREP A (OFFICE)  POCT URINALYSIS DIP (MANUAL ENTRY)     Allergies  Allergen Reactions   Pineapple Shortness Of Breath and Swelling    Has Epi pen   Eggs Or Egg-Derived Products     GI issues    Hctz [Hydrochlorothiazide]     Headaches, foggy headed     Latex Swelling   Lisinopril     Cough    Morphine And Related Itching   Tape Swelling    Past Medical History:  Diagnosis Date   Abdominal pain, epigastric 12/17/2015   Anxiety    pt denies having dx of anxiety   BCC (basal cell carcinoma of skin)    right foot    Bunion, right foot    Chronic cough    Closed fibular fracture 10/2008   Left - Sustained 2/2 fall down stairs (same fall as tibular fracture). S/P internal fixation.   Duodenitis    EGD (07/14/2011) showing chronic duodenitis, H.pyloir neg   Elevated liver enzymes    Endometriosis    S/P TAH and salpingo-oophorectomy, right   Epigastric pain    Chronic, thought 2/2 gastritis, EGD (07/14/2011) showing chronic duodenitis, H.pyloir neg // Repeat EGD (09/2011) - esophagealring, sessible polyp in stomach body, hiatal hernia - rec ad probiotic, await bx   Esophageal ring    s/p dilatation during EGD (07/2011) - Dr. 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 With chronic OA of hip   History of migraine headaches    Typical symptoms - bright lights, unilateral (starting behind her left ear), throbbing .   Hot flashes    Hypertension    Internal hemorrhoids    Osteoarthritis of hip    Ovarian cancer ([place]) 1995   Protrusio acetabuli    diagnosed at age 2   Sessile colonic polyp    noted 09/2011 -- > rec colonoscopy in 10 years, 2023   Surgical  menopause 08/2007   On HRT. Occuring since 01/09 following TAH and R-salpingo-oophorectomy    Tibial fracture 10/2008   Left - Sustained 2/2 fall down stairs. 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   Patient is married with two children, Madison and Tamera Punt (son 39 y.o as of 03/22/20 going to [employer]).    She lives in [place] but children go to school in [place]    Right handed   Caffeine: no   Social Determinants of Health   Financial Resource Strain: Not on file  Food Insecurity: Not on file  Transportation Needs: Not on file  Physical Activity: Not on file  Stress: Not on file  Social Connections: Not on file  Intimate Partner Violence: Not on file   Family History  Problem Relation Age of Onset   Other Father        deceased while in service.    Breast cancer Sister 57       remission   Cancer Sister        breast   Lupus Cousin    Other Mother        unknown medical history   Dementia Maternal Grandmother        at old age   Parkinson's disease Maternal Grandfather    Multiple sclerosis Other        uncle    Colon cancer Neg Hx    Liver disease Neg Hx    Migraines Neg Hx    Past Surgical History:  Procedure Laterality Date   ABDOMINAL HYSTERECTOMY      CESAREAN SECTION  08/2005   Primary low transverse   COLONOSCOPY  09/22/2011   [id number] polyp in the rectum/Internal hemorrhoids, benign path   ESOPHAGOGASTRODUODENOSCOPY  09/22/2011   OMB:TDHR, esophageal/Sessile polyp in the body of the stomach/Hiatal hernia/ABDOMINAL PAIN LIKELY FUNCTIONAL V. POST-INFECTIOUS IBS, path: mild chronic gastritis   FRACTURE SURGERY     Right hip   JOINT REPLACEMENT     right hip   LAPAROSCOPY  10/2000   Operative laparoscopy with lysis of right adnexal adhesions and uterointestinal adhesions   left tib fib     OTHER SURGICAL HISTORY  02/2009   Closed treatment internal fixation, left tibia with Tamala Julian and Nephew tibial nail locked proximal and distal   TOTAL ABDOMINAL HYSTERECTOMY W/ BILATERAL SALPINGOOPHORECTOMY  08/2007   for endometriosis. With concurrent right salpingo-oophorectomy  (2009), left oophorectomy (1995)   TUBAL LIGATION  08/2005   Right tubal ligation     Vanessa Kick, MD 01/22/21 1159

## 2021-01-22 NOTE — Discharge Instructions (Signed)
You have been tested for COVID-19 today. If your test returns positive, you will receive a phone call from [facility] regarding your results. Negative test results are not called. Both positive and negative results area always visible on MyChart. If you do not have a MyChart account, sign up instructions are provided in your discharge papers. Please do not hesitate to contact us should you have questions or concerns.  Your blood pressure was noted to be elevated during your visit today. If you are currently taking medication for high blood pressure, please ensure you are taking this as directed. If you do not have a history of high blood pressure and your blood pressure remains persistently elevated, you may need to begin taking a medication at some point. You may return here within the next few days to recheck if unable to see your primary care provider or if you do not have a one.  BP (!) 150/78 (BP Location: Right Arm)   Pulse 95   Temp 98.8 F (37.1 C) (Oral)   Resp 18   SpO2 94%   BP Readings from Last 3 Encounters:  01/22/21 (!) 150/78  01/01/21 122/84  10/30/20 122/76

## 2021-01-23 ENCOUNTER — Ambulatory Visit: Payer: Medicare Other | Admitting: Neurology

## 2021-01-23 LAB — COVID-19, FLU A+B NAA
Influenza A, NAA: NOT DETECTED
Influenza B, NAA: NOT DETECTED
SARS-CoV-2, NAA: DETECTED — AB

## 2021-01-24 LAB — URINE CULTURE

## 2021-01-25 LAB — CULTURE, GROUP A STREP (THRC)

## 2021-02-25 ENCOUNTER — Telehealth: Payer: Self-pay | Admitting: Internal Medicine

## 2021-02-25 NOTE — Telephone Encounter (Signed)
UV light is not good for the skin. No  Vitamin D3 via pills or food is best and can get via natural sun light NOT in Excess

## 2021-02-25 NOTE — Telephone Encounter (Signed)
Patient calling in and states that a friend of hers has low vitamin D levels and bought a UV light off of [place]. Patient states that due to some of her medications she can not be in the sun long without burning. Patient wanting to know if this UV light would be a safe option for her?   Please advise

## 2021-02-26 NOTE — Telephone Encounter (Signed)
Patient informed and verbalized understanding

## 2021-03-13 ENCOUNTER — Ambulatory Visit: Admit: 2021-03-13 | Disposition: A | Discharge status: Home / Self Care | Payer: BC Managed Care – PPO

## 2021-03-13 ENCOUNTER — Encounter: Payer: Self-pay | Admitting: Emergency Medicine

## 2021-03-13 ENCOUNTER — Ambulatory Visit
Admission: EM | Admit: 2021-03-13 | Discharge: 2021-03-13 | Disposition: A | Discharge status: Home / Self Care | Payer: BC Managed Care – PPO | Attending: Family Medicine | Admitting: Family Medicine

## 2021-03-13 DIAGNOSIS — M545 Low back pain, unspecified: Secondary | ICD-10-CM | POA: Insufficient documentation

## 2021-03-13 DIAGNOSIS — N3090 Cystitis, unspecified without hematuria: Secondary | ICD-10-CM | POA: Diagnosis not present

## 2021-03-13 LAB — POCT URINALYSIS DIP (MANUAL ENTRY)
Bilirubin, UA: NEGATIVE
Glucose, UA: NEGATIVE mg/dL
Ketones, POC UA: NEGATIVE mg/dL
Nitrite, UA: NEGATIVE
Protein Ur, POC: NEGATIVE mg/dL
Spec Grav, UA: 1.015 (ref 1.010–1.025)
Urobilinogen, UA: 0.2 E.U./dL
pH, UA: 6 (ref 5.0–8.0)

## 2021-03-13 MED ORDER — CEPHALEXIN 500 MG PO CAPS: 500.0000 mg | ORAL_CAPSULE | Freq: Two times a day (BID) | ORAL | 0 refills | Status: DC

## 2021-03-13 MED ORDER — METHOCARBAMOL 500 MG PO TABS: 500.0000 mg | ORAL_TABLET | Freq: Two times a day (BID) | ORAL | 0 refills | Status: DC

## 2021-03-13 NOTE — ED Provider Notes (Signed)
[place]   [id number] 03/13/21 Arrival Time: [id number]  ASSESSMENT & PLAN:  1. Acute bilateral low back pain without sciatica   2. Cystitis    Able to ambulate here and hemodynamically stable. No indication for imaging of back at this time given no trauma and normal neurological exam. Discussed.  Meds ordered this encounter  Medications   methocarbamol (ROBAXIN) 500 MG tablet    Sig: Take 1 tablet (500 mg total) by mouth 2 (two) times daily.    Dispense:  20 tablet    Refill:  0   cephALEXin (KEFLEX) 500 MG capsule    Sig: Take 1 capsule (500 mg total) by mouth 2 (two) times daily.    Dispense:  10 capsule    Refill:  0   Medication sedation precautions given. Encourage ROM/movement as tolerated. Urine culture sent.  Recommend:  Follow-up Information     McLean-Scocuzza, Nino Glow, MD.   Specialty: Internal Medicine Why: If worsening or failing to improve as anticipated. Contact information: [place] [address] [phone]                 Reviewed expectations re: course of current medical issues. Questions answered. Outlined signs and symptoms indicating need for more acute intervention. Patient verbalized understanding. After Visit Summary given.   SUBJECTIVE: History from: patient. [name] is a 45 y.o. female who presents with complaint of intermittent bilateral LBP; past two weeks after straining to get out of tub; "twisted and felt the pain". Worse with certain movements. No radiation. Normal bowel/bladder habits. No extremity sensation changes or weakness. No OTC tx. Also with mild urinary frequency. Not sure of duration. Afebrile.    OBJECTIVE:  Vitals:   03/13/21 1755  BP: (!) 141/96  Pulse: 77  Resp: 16  Temp: 98.5 F (36.9 C)  TempSrc: Oral  SpO2: 98%    General appearance: alert; no distress HEENT: ; AT Neck: supple with FROM; without midline tenderness CV: regular Lungs: unlabored  respirations; speaks full sentences without difficulty Abdomen: soft, non-tender; non-distended Back: mild  and poorly localized tenderness to palpation over bilateral lumbar musculature ; FROM at waist; bruising: none; without midline tenderness Extremities: without edema; symmetrical without gross deformities; normal ROM of bilateral LE Skin: warm and dry Neurologic: normal gait; normal sensation and strength of bilateral LE Psychological: alert and cooperative; normal mood and affect  Labs: Results for orders placed or performed during the hospital encounter of 03/13/21  POCT urinalysis dipstick  Result Value Ref Range   Color, UA yellow yellow   Clarity, UA clear clear   Glucose, UA negative negative mg/dL   Bilirubin, UA negative negative   Ketones, POC UA negative negative mg/dL   Spec Grav, UA 1.015 1.010 - 1.025   Blood, UA trace-intact (A) negative   pH, UA 6.0 5.0 - 8.0   Protein Ur, POC negative negative mg/dL   Urobilinogen, UA 0.2 0.2 or 1.0 E.U./dL   Nitrite, UA Negative Negative   Leukocytes, UA Small (1+) (A) Negative   Labs Reviewed  POCT URINALYSIS DIP (MANUAL ENTRY) - Abnormal; Notable for the following components:      Result Value   Blood, UA trace-intact (*)    Leukocytes, UA Small (1+) (*)    All other components within normal limits  URINE CULTURE    Allergies  Allergen Reactions   Pineapple Shortness Of Breath and Swelling    Has Epi pen   Eggs Or Egg-Derived Products  GI issues    Hctz [Hydrochlorothiazide]     Headaches, foggy headed     Latex Swelling   Lisinopril     Cough    Morphine And Related Itching   Tape Swelling    Past Medical History:  Diagnosis Date   Abdominal pain, epigastric 12/17/2015   Anxiety    pt denies having dx of anxiety   BCC (basal cell carcinoma of skin)    right foot    Bunion, right foot    Chronic cough    Closed fibular fracture 10/2008   Left - Sustained 2/2 fall down stairs (same fall as tibular  fracture). S/P internal fixation.   Duodenitis    EGD (07/14/2011) showing chronic duodenitis, H.pyloir neg   Elevated liver enzymes    Endometriosis    S/P TAH and salpingo-oophorectomy, right   Epigastric pain    Chronic, thought 2/2 gastritis, EGD (07/14/2011) showing chronic duodenitis, H.pyloir neg // Repeat EGD (09/2011) - esophagealring, sessible polyp in stomach body, hiatal hernia - rec ad probiotic, await bx   Esophageal ring    s/p dilatation during EGD (07/2011) - Dr. 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 With chronic OA of hip   History of migraine headaches    Typical symptoms - bright lights, unilateral (starting behind her left ear), throbbing .   Hot flashes    Hypertension    Internal hemorrhoids    Osteoarthritis of hip    Ovarian cancer ([place]) 1995   Protrusio acetabuli    diagnosed at age 26   Sessile colonic polyp    noted 09/2011 -- > rec colonoscopy in 10 years, 2023   Surgical menopause 08/2007   On HRT. Occuring since 01/09 following TAH and R-salpingo-oophorectomy    Tibial fracture 10/2008   Left - Sustained 2/2 fall down stairs. 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   Patient is married with two children, Madison and Tamera Punt (son 64 y.o as of 03/22/20 going to [employer]).    She lives in [place] but children go to school in [place]    Right handed   Caffeine: no   Social Determinants of Health   Financial Resource Strain: Not on file  Food Insecurity: Not on file  Transportation Needs: Not on file  Physical Activity: Not on file  Stress: Not on file  Social  Connections: Not on file  Intimate Partner Violence: Not on file   Family History  Problem Relation Age of Onset   Other Father        deceased while in service.    Breast cancer Sister 90       remission   Cancer Sister        breast   Lupus Cousin    Other Mother        unknown medical history   Dementia Maternal Grandmother        at old age   Parkinson's disease Maternal Grandfather    Multiple sclerosis Other        uncle    Colon cancer Neg Hx    Liver disease Neg Hx    Migraines Neg Hx    Past Surgical History:  Procedure Laterality Date   ABDOMINAL HYSTERECTOMY     CESAREAN SECTION  08/2005   Primary low transverse   COLONOSCOPY  09/22/2011   [id number] polyp in the rectum/Internal hemorrhoids, benign path   ESOPHAGOGASTRODUODENOSCOPY  09/22/2011   [id number], esophageal/Sessile polyp in the body of the stomach/Hiatal hernia/ABDOMINAL PAIN LIKELY FUNCTIONAL V. POST-INFECTIOUS IBS, path: mild chronic gastritis   FRACTURE SURGERY     Right hip   JOINT REPLACEMENT     right hip   LAPAROSCOPY  10/2000   Operative laparoscopy with lysis of right adnexal adhesions and uterointestinal adhesions   left tib fib     OTHER SURGICAL HISTORY  02/2009   Closed treatment internal fixation, left tibia with Tamala Julian and Nephew tibial nail locked proximal and distal   TOTAL ABDOMINAL HYSTERECTOMY W/ BILATERAL  SALPINGOOPHORECTOMY  08/2007   for endometriosis. With concurrent right salpingo-oophorectomy  (2009), left oophorectomy (1995)   TUBAL LIGATION  08/2005   Right tubal ligation      Vanessa Kick, MD 03/14/21 1051

## 2021-03-13 NOTE — Discharge Instructions (Addendum)

## 2021-03-13 NOTE — ED Triage Notes (Signed)
Lower back pain x 2 weeks.  Hurts when sitting and trying to get up.

## 2021-03-15 LAB — URINE CULTURE

## 2021-04-03 ENCOUNTER — Other Ambulatory Visit: Payer: Self-pay

## 2021-04-03 ENCOUNTER — Ambulatory Visit (INDEPENDENT_AMBULATORY_CARE_PROVIDER_SITE_OTHER): Payer: BC Managed Care – PPO | Admitting: Plastic Surgery

## 2021-04-03 ENCOUNTER — Encounter: Payer: Self-pay | Admitting: Plastic Surgery

## 2021-04-03 VITALS — BP 115/79 | HR 106 | Ht 72.0 in | Wt 190.0 lb

## 2021-04-03 DIAGNOSIS — M545 Low back pain, unspecified: Secondary | ICD-10-CM | POA: Diagnosis not present

## 2021-04-03 DIAGNOSIS — M4004 Postural kyphosis, thoracic region: Secondary | ICD-10-CM | POA: Diagnosis not present

## 2021-04-03 DIAGNOSIS — N62 Hypertrophy of breast: Secondary | ICD-10-CM

## 2021-04-03 DIAGNOSIS — M546 Pain in thoracic spine: Secondary | ICD-10-CM | POA: Diagnosis not present

## 2021-04-03 NOTE — Progress Notes (Signed)
Referring Provider McLean-Scocuzza, Nino Glow, MD [place],  [place] [address]   CC:  Chief Complaint  Patient presents with   Advice Only      [name] is an 45 y.o. female.  HPI: Patient presents to discuss breast reduction.  She has had years of back pain, neck pain and shoulder grooving related to her large breast.  She tried over-the-counter medications, warm packs, cold packs and supportive bras with little relief.  She is a 34 double D and wants to be smaller.  She is up-to-date on her mammograms with her last 1 being normal.  Her sister did have breast cancer but no previous breast biopsies or procedures for her.  She does not smoke and is not a diabetic.  She has been to physical therapy.  Allergies  Allergen Reactions   Pineapple Shortness Of Breath and Swelling    Has Epi pen   Eggs Or Egg-Derived Products     GI issues    Hctz [Hydrochlorothiazide]     Headaches, foggy headed     Latex Swelling   Lisinopril     Cough    Morphine And Related Itching   Tape Swelling    Outpatient Encounter Medications as of 04/03/2021  Medication Sig Note   amLODipine (NORVASC) 10 MG tablet Take 1 tablet (10 mg total) by mouth daily.    hydroquinone 4 % cream Apply topically at bedtime. qhs x 3-4 months then 2x per week as needed 03/02/2020: Haven't started yet   losartan (COZAAR) 25 MG tablet Take 1 tablet (25 mg total) by mouth daily. Take if BP >130/>80    Multiple Vitamin (MULTIVITAMIN PO) Take by mouth.    VITAMIN E PO Take by mouth.    [DISCONTINUED] cephALEXin (KEFLEX) 500 MG capsule Take 1 capsule (500 mg total) by mouth 2 (two) times daily.    [DISCONTINUED] methocarbamol (ROBAXIN) 500 MG tablet Take 1 tablet (500 mg total) by mouth 2 (two) times daily.    No facility-administered encounter medications on file as of 04/03/2021.     Past Medical History:  Diagnosis Date   Abdominal pain, epigastric 12/17/2015   Anxiety    pt denies having dx of  anxiety   BCC (basal cell carcinoma of skin)    right foot    Bunion, right foot    Chronic cough    Closed fibular fracture 10/2008   Left - Sustained 2/2 fall down stairs (same fall as tibular fracture). S/P internal fixation.   Duodenitis    EGD (07/14/2011) showing chronic duodenitis, H.pyloir neg   Elevated liver enzymes    Endometriosis    S/P TAH and salpingo-oophorectomy, right   Epigastric pain    Chronic, thought 2/2 gastritis, EGD (07/14/2011) showing chronic duodenitis, H.pyloir neg // Repeat EGD (09/2011) - esophagealring, sessible polyp in stomach body, hiatal hernia - rec ad probiotic, await bx   Esophageal ring    s/p dilatation during EGD (07/2011) - Dr. 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 With chronic OA of hip   History of migraine headaches    Typical symptoms - bright lights, unilateral (starting behind her left ear), throbbing .   Hot flashes  Hypertension    Internal hemorrhoids    Osteoarthritis of hip    Ovarian cancer ([place]) 1995   Protrusio acetabuli    diagnosed at age 49   Sessile colonic polyp    noted 09/2011 -- > rec colonoscopy in 10 years, 2023   Surgical menopause 08/2007   On HRT. Occuring since 01/09 following TAH and R-salpingo-oophorectomy    Tibial fracture 10/2008   Left - Sustained 2/2 fall down stairs. Patient is S/P closed internal fixation with Smith Nephew tibial nail locked proximal and distal   Vitamin D deficiency 11/2009   Vit D level 15 in 11/2009    Past Surgical History:  Procedure Laterality Date   ABDOMINAL HYSTERECTOMY     CESAREAN SECTION  08/2005   Primary low transverse   COLONOSCOPY  09/22/2011   [id number] polyp in the rectum/Internal hemorrhoids, benign path    ESOPHAGOGASTRODUODENOSCOPY  09/22/2011   [id number], esophageal/Sessile polyp in the body of the stomach/Hiatal hernia/ABDOMINAL PAIN LIKELY FUNCTIONAL V. POST-INFECTIOUS IBS, path: mild chronic gastritis   FRACTURE SURGERY     Right hip   JOINT REPLACEMENT     right hip   LAPAROSCOPY  10/2000   Operative laparoscopy with lysis of right adnexal adhesions and uterointestinal adhesions   left tib fib     OTHER SURGICAL HISTORY  02/2009   Closed treatment internal fixation, left tibia with Tamala Julian and Nephew tibial nail locked proximal and distal   TOTAL ABDOMINAL HYSTERECTOMY W/ BILATERAL SALPINGOOPHORECTOMY  08/2007   for endometriosis. With concurrent right salpingo-oophorectomy  (2009), left oophorectomy (1995)   TUBAL LIGATION  08/2005   Right tubal ligation    Family History  Problem Relation Age of Onset   Other Father        deceased while in service.    Breast cancer Sister 27       remission   Cancer Sister        breast   Lupus Cousin    Other Mother        unknown medical history   Dementia Maternal Grandmother        at old age   Parkinson's disease Maternal Grandfather    Multiple sclerosis Other        uncle    Colon cancer Neg Hx    Liver disease Neg Hx    Migraines Neg Hx     Social History   Social History Narrative   Insurance: Information systems manager.   Patient is married with two children, Madison and Tamera Punt (son 69 y.o as of 03/22/20 going to [employer]).    She lives in [place] but children go to school in [place]    Right handed   Caffeine: no     Review of Systems General: Denies fevers, chills, weight loss CV: Denies chest pain, shortness of breath, palpitations  Physical Exam Vitals with BMI 04/03/2021 03/13/2021 01/22/2021  Height '6\' 0"'$  - -  Weight 190 lbs - -  BMI [id number] - -  Systolic [id number] Q000111Q  Diastolic 79 96 78  Pulse [id number] 77 95    General:  No acute distress,  Alert and oriented, Non-Toxic, Normal speech and affect Breast: She has grade 2  ptosis.  Sternal notch to nipple is 30 cm bilaterally.  Nipple to fold is 13 cm bilaterally.  She may be slightly fuller on the left side.  No obvious scars or masses.  Assessment/Plan The patient has bilateral symptomatic macromastia.  She is a good candidate  for a breast reduction.  She is interested in pursuing surgical treatment.  She has tried supportive garments and fitted bras with no relief.  The details of breast reduction surgery were discussed.  I explained the procedure in detail along the with the expected scars.  The risks were discussed in detail and include bleeding, infection, damage to surrounding structures, need for additional procedures, nipple loss, change in nipple sensation, persistent pain, contour irregularities and asymmetries.  I explained that breast feeding is often not possible after breast reduction surgery.  We discussed the expected postoperative course with an overall recovery period of about 1 month.  She demonstrated full understanding of all risks.  We discussed her personal risk factors.  The patient is interested in pursuing surgical treatment.  I anticipate approximately 750g of tissue removed from each side.   Cindra Presume 04/03/2021, 10:45 AM

## 2021-04-09 DIAGNOSIS — M2041 Other hammer toe(s) (acquired), right foot: Secondary | ICD-10-CM | POA: Diagnosis not present

## 2021-04-09 DIAGNOSIS — M21612 Bunion of left foot: Secondary | ICD-10-CM | POA: Diagnosis not present

## 2021-04-09 DIAGNOSIS — M21611 Bunion of right foot: Secondary | ICD-10-CM | POA: Diagnosis not present

## 2021-04-09 DIAGNOSIS — M722 Plantar fascial fibromatosis: Secondary | ICD-10-CM | POA: Diagnosis not present

## 2021-04-09 DIAGNOSIS — M2042 Other hammer toe(s) (acquired), left foot: Secondary | ICD-10-CM | POA: Diagnosis not present

## 2021-04-17 DIAGNOSIS — Z471 Aftercare following joint replacement surgery: Secondary | ICD-10-CM | POA: Diagnosis not present

## 2021-04-17 DIAGNOSIS — Z96643 Presence of artificial hip joint, bilateral: Secondary | ICD-10-CM | POA: Diagnosis not present

## 2021-04-29 DIAGNOSIS — F411 Generalized anxiety disorder: Secondary | ICD-10-CM | POA: Diagnosis not present

## 2021-05-13 DIAGNOSIS — F411 Generalized anxiety disorder: Secondary | ICD-10-CM | POA: Diagnosis not present

## 2021-05-16 DIAGNOSIS — R748 Abnormal levels of other serum enzymes: Secondary | ICD-10-CM | POA: Diagnosis not present

## 2021-05-16 DIAGNOSIS — N951 Menopausal and female climacteric states: Secondary | ICD-10-CM | POA: Diagnosis not present

## 2021-05-16 DIAGNOSIS — Z01419 Encounter for gynecological examination (general) (routine) without abnormal findings: Secondary | ICD-10-CM | POA: Diagnosis not present

## 2021-05-16 DIAGNOSIS — Z6826 Body mass index (BMI) 26.0-26.9, adult: Secondary | ICD-10-CM | POA: Diagnosis not present

## 2021-05-17 ENCOUNTER — Telehealth: Payer: Self-pay | Admitting: Plastic Surgery

## 2021-05-17 NOTE — Telephone Encounter (Signed)
Patient called to check status of insurance authorization. Case is still pending as of 05/17/2021. Unable to lvm for patient as "memory is full"

## 2021-06-10 DIAGNOSIS — F411 Generalized anxiety disorder: Secondary | ICD-10-CM | POA: Diagnosis not present

## 2021-06-12 ENCOUNTER — Ambulatory Visit (INDEPENDENT_AMBULATORY_CARE_PROVIDER_SITE_OTHER): Payer: BC Managed Care – PPO | Admitting: Internal Medicine

## 2021-06-12 ENCOUNTER — Encounter: Payer: Self-pay | Admitting: Internal Medicine

## 2021-06-12 ENCOUNTER — Other Ambulatory Visit: Payer: Self-pay

## 2021-06-12 VITALS — BP 118/80 | HR 60 | Temp 98.2°F | Ht 72.0 in | Wt 191.6 lb

## 2021-06-12 DIAGNOSIS — I10 Essential (primary) hypertension: Secondary | ICD-10-CM | POA: Diagnosis not present

## 2021-06-12 DIAGNOSIS — H53149 Visual discomfort, unspecified: Secondary | ICD-10-CM | POA: Diagnosis not present

## 2021-06-12 DIAGNOSIS — E559 Vitamin D deficiency, unspecified: Secondary | ICD-10-CM | POA: Diagnosis not present

## 2021-06-12 DIAGNOSIS — L732 Hidradenitis suppurativa: Secondary | ICD-10-CM

## 2021-06-12 DIAGNOSIS — R4189 Other symptoms and signs involving cognitive functions and awareness: Secondary | ICD-10-CM

## 2021-06-12 DIAGNOSIS — R7303 Prediabetes: Secondary | ICD-10-CM

## 2021-06-12 DIAGNOSIS — Z0000 Encounter for general adult medical examination without abnormal findings: Secondary | ICD-10-CM

## 2021-06-12 DIAGNOSIS — G43911 Migraine, unspecified, intractable, with status migrainosus: Secondary | ICD-10-CM

## 2021-06-12 LAB — COMPREHENSIVE METABOLIC PANEL
ALT: 18 U/L (ref 0–35)
AST: 23 U/L (ref 0–37)
Albumin: 4.5 g/dL (ref 3.5–5.2)
Alkaline Phosphatase: 72 U/L (ref 39–117)
BUN: 11 mg/dL (ref 6–23)
CO2: 27 mEq/L (ref 19–32)
Calcium: 10.2 mg/dL (ref 8.4–10.5)
Chloride: 102 mEq/L (ref 96–112)
Creatinine, Ser: 0.97 mg/dL (ref 0.40–1.20)
GFR: 69.62 mL/min (ref >60.00)
Glucose, Bld: 67 mg/dL — ABNORMAL LOW (ref 70–99)
Potassium: 4.2 mEq/L (ref 3.5–5.1)
Sodium: 140 mEq/L (ref 135–145)
Total Bilirubin: 1.3 mg/dL — ABNORMAL HIGH (ref 0.2–1.2)
Total Protein: 7.5 g/dL (ref 6.0–8.3)

## 2021-06-12 LAB — CBC WITH DIFFERENTIAL/PLATELET
Basophils Absolute: 0 10*3/uL (ref 0.0–0.1)
Basophils Relative: 0.3 % (ref 0.0–3.0)
Eosinophils Absolute: 0.1 10*3/uL (ref 0.0–0.7)
Eosinophils Relative: 0.6 % (ref 0.0–5.0)
HCT: 38.2 % (ref 36.0–46.0)
Hemoglobin: 12.2 g/dL (ref 12.0–15.0)
Lymphocytes Relative: 21.6 % (ref 12.0–46.0)
Lymphs Abs: 2.2 10*3/uL (ref 0.7–4.0)
MCHC: 32 g/dL (ref 30.0–36.0)
MCV: 85.1 fl (ref 78.0–100.0)
Monocytes Absolute: 0.8 10*3/uL (ref 0.1–1.0)
Monocytes Relative: 7.8 % (ref 3.0–12.0)
Neutro Abs: 7.2 10*3/uL (ref 1.4–7.7)
Neutrophils Relative %: 69.7 % (ref 43.0–77.0)
Platelets: 309 10*3/uL (ref 150.0–400.0)
RBC: 4.49 Mil/uL (ref 3.87–5.11)
RDW: 14.4 % (ref 11.5–15.5)
WBC: 10.3 10*3/uL (ref 4.0–10.5)

## 2021-06-12 LAB — VITAMIN D 25 HYDROXY (VIT D DEFICIENCY, FRACTURES): VITD: 33.03 ng/mL (ref 30.00–100.00)

## 2021-06-12 LAB — LIPID PANEL
Cholesterol: 170 mg/dL (ref 0–200)
HDL: 57.9 mg/dL (ref >39.00)
LDL Cholesterol: 96 mg/dL (ref 0–99)
NonHDL: 111.78
Total CHOL/HDL Ratio: 3
Triglycerides: 77 mg/dL (ref 0.0–149.0)
VLDL: 15.4 mg/dL (ref 0.0–40.0)

## 2021-06-12 LAB — HEMOGLOBIN A1C: Hgb A1c MFr Bld: 6 % (ref 4.6–6.5)

## 2021-06-12 MED ORDER — AMLODIPINE BESYLATE 10 MG PO TABS: 10.0000 mg | ORAL_TABLET | Freq: Every day | ORAL | 3 refills | Status: DC

## 2021-06-12 MED ORDER — SPIRONOLACTONE 50 MG PO TABS: 50.0000 mg | ORAL_TABLET | Freq: Every day | ORAL | 3 refills | Status: DC

## 2021-06-12 MED ORDER — LOSARTAN POTASSIUM 25 MG PO TABS: 25.0000 mg | ORAL_TABLET | Freq: Every day | ORAL | 3 refills | Status: DC

## 2021-06-12 NOTE — Progress Notes (Signed)
Chief Complaint  Patient presents with   Annual Exam   Annual  1. Had covid 01/2021 and c/o brain fog memory loss with h/o migraines missed appt [facility] 01/30/21 will refax referral to reschedule  2. On norvasc 10 mg qd and losartan 25 qd and spironolactone 50 mg per dermatology for hidranitis suppurative    Review of Systems  Constitutional:  Negative for weight loss.  HENT:  Negative for hearing loss.   Eyes:  Negative for blurred vision.  Respiratory:  Negative for shortness of breath.   Cardiovascular:  Negative for chest pain.  Gastrointestinal:  Negative for abdominal pain.  Musculoskeletal:  Positive for falls. Negative for joint pain.  Skin:  Negative for rash.  Neurological:  Negative for headaches.  Psychiatric/Behavioral:  Negative for depression.   Past Medical History:  Diagnosis Date   Abdominal pain, epigastric 12/17/2015   Anxiety    pt denies having dx of anxiety   BCC (basal cell carcinoma of skin)    right foot    Bunion, right foot    Chronic cough    Closed fibular fracture 10/2008   Left - Sustained 2/2 fall down stairs (same fall as tibular fracture). S/P internal fixation.   COVID-19    mild back pain, brain fog 01/2021   Duodenitis    EGD (07/14/2011) showing chronic duodenitis, H.pyloir neg   Elevated liver enzymes    Endometriosis    S/P TAH and salpingo-oophorectomy, right   Epigastric pain    Chronic, thought 2/2 gastritis, EGD (07/14/2011) showing chronic duodenitis, H.pyloir neg // Repeat EGD (09/2011) - esophagealring, sessible polyp in stomach body, hiatal hernia - rec ad probiotic, await bx   Esophageal ring    s/p dilatation during EGD (07/2011) - Dr. Oneida Alar   Fatty liver    Gastritis    Thought due to chronic NSAID use 2/2 pain from her congenital hip abnormality   Headache(784.0)    Migraines   Hidradenitis suppurativa 05/2006   Hip deformity, congenital    Protrusio acetabuli with articular sclerosis and flattening of femoral  heads. With chronic OA of hip   History of migraine headaches    Typical symptoms - bright lights, unilateral (starting behind her left ear), throbbing .   Hot flashes    Hypertension    Internal hemorrhoids    Osteoarthritis of hip    Ovarian cancer ([place]) 1995   Protrusio acetabuli    diagnosed at age 16   Sessile colonic polyp    noted 09/2011 -- > rec colonoscopy in 10 years, 2023   Surgical menopause 08/2007   On HRT. Occuring since 01/09 following TAH and R-salpingo-oophorectomy    Tibial fracture 10/2008   Left - Sustained 2/2 fall down stairs. Patient is S/P closed internal fixation with Smith Nephew tibial nail locked proximal and distal   Vitamin D deficiency 11/2009   Vit D level 15 in 11/2009   Past Surgical History:  Procedure Laterality Date   ABDOMINAL HYSTERECTOMY     CESAREAN SECTION  08/2005   Primary low transverse   COLONOSCOPY  09/22/2011   [id number] polyp in the rectum/Internal hemorrhoids, benign path   ESOPHAGOGASTRODUODENOSCOPY  09/22/2011   [id number], esophageal/Sessile polyp in the body of the stomach/Hiatal hernia/ABDOMINAL PAIN LIKELY FUNCTIONAL V. POST-INFECTIOUS IBS, path: mild chronic gastritis   FRACTURE SURGERY     Right hip   JOINT REPLACEMENT     right hip   LAPAROSCOPY  10/2000   Operative laparoscopy with lysis  of right adnexal adhesions and uterointestinal adhesions   left tib fib     OTHER SURGICAL HISTORY  02/2009   Closed treatment internal fixation, left tibia with Tamala Julian and Nephew tibial nail locked proximal and distal   TOTAL ABDOMINAL HYSTERECTOMY W/ BILATERAL SALPINGOOPHORECTOMY  08/2007   for endometriosis. With concurrent right salpingo-oophorectomy  (2009), left oophorectomy (1995)   TUBAL LIGATION  08/2005   Right tubal ligation   Family History  Problem Relation Age of Onset   Other Father        deceased while in service.    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   Patient is married with two children, Madison and Tamera Punt (son 51 y.o as of 03/22/20 going to [employer]).    She lives in [place] but children go to school in [place]    Right handed   Caffeine: no   Social Determinants of Health   Financial Resource Strain: Not on file  Food Insecurity: Not on file  Transportation Needs: Not on file  Physical Activity: Not on file  Stress: Not on file  Social Connections: Not on file  Intimate Partner Violence: Not on file   Current Meds  Medication Sig   hydroquinone 4 % cream Apply topically at bedtime. qhs x 3-4 months then 2x per week as needed   Multiple Vitamin (MULTIVITAMIN PO) Take by mouth.   spironolactone (ALDACTONE) 50 MG tablet Take 1 tablet (50 mg total) by mouth daily. In am   VITAMIN E PO Take by mouth.   [DISCONTINUED] amLODipine (NORVASC) 10 MG tablet Take 1 tablet (10 mg total) by mouth daily.   [DISCONTINUED] losartan (COZAAR) 25 MG tablet Take 1 tablet (25 mg total) by mouth daily. Take if BP >130/>80   Allergies  Allergen Reactions   Pineapple Shortness Of Breath and Swelling    Has  Epi pen   Eggs Or Egg-Derived Products     GI issues    Hctz [Hydrochlorothiazide]     Headaches, foggy headed     Latex Swelling   Lisinopril     Cough    Morphine And Related Itching   Tape Swelling   No results found for this or any previous visit (from the  past 2160 hour(s)). Objective  Body mass index is 25.99 kg/m. Wt Readings from Last 3 Encounters:  06/12/21 191 lb 9.6 oz (86.9 kg)  04/03/21 190 lb (86.2 kg)  01/01/21 200 lb (90.7 kg)   Temp Readings from Last 3 Encounters:  06/12/21 98.2 F (36.8 C) (Oral)  03/13/21 98.5 F (36.9 C) (Oral)  01/22/21 98.8 F (37.1 C) (Oral)   BP Readings from Last 3 Encounters:  06/12/21 118/80  04/03/21 115/79  03/13/21 (!) 141/96   Pulse Readings from Last 3 Encounters:  06/12/21 60  04/03/21 (!) 106  03/13/21 77    Physical Exam Vitals and nursing note reviewed.  Constitutional:      Appearance: Normal appearance. She is well-developed, well-groomed and overweight.  HENT:     Head: Normocephalic and atraumatic.  Eyes:     Conjunctiva/sclera: Conjunctivae normal.     Pupils: Pupils are equal, round, and reactive to light.  Cardiovascular:     Rate and Rhythm: Normal rate and regular rhythm.     Heart sounds: Normal heart sounds. No murmur heard. Pulmonary:     Effort: Pulmonary effort is normal.     Breath sounds: Normal breath sounds.  Abdominal:     Tenderness: There is no abdominal tenderness.  Skin:    General: Skin is warm and dry.  Neurological:     General: No focal deficit present.     Mental Status: She is alert and oriented to person, place, and time. Mental status is at baseline.     Gait: Gait normal.  Psychiatric:        Attention and Perception: Attention and perception normal.        Mood and Affect: Mood and affect normal.        Speech: Speech normal.        Behavior: Behavior normal. Behavior is cooperative.        Thought Content: Thought content normal.        Cognition and Memory:  Cognition and memory normal.        Judgment: Judgment normal.    Assessment  Plan  Annual physical exam Flu shot will get in future egg free  Hep B immune, consider hep A vaccine h/o fatty liver  Tdap up to date 06/05/16 pna 23 had 02/19/09 HCV neg 06/05/16  HIV neg 06/05/16 covid 3/3 sch 06/15/21 4th dose had covid 01/2021    mammo 06/20/18 neg referred again [facility] with dexa  -sch 11/2021had mammo and dexa Sch 06/17/21     Get copy of pap [facility] [place] saw 2019 s/p hysterectomy for endometriosis in 2007 prior LSO in 1995 for ovarian issues per OB/GYN notes 05/2018 [facility] -no rec paps per OB/GYN no h/o abnormals per note 05/14/18 ob/gyn     Colonoscopy having done [facility] unable to due did CT colonoscopy and utd no issues chronic IBS-C   Rec healthy diet and exercise   Brain fog - Plan: Ambulatory referral to Neurology  Intractable migraine with status migrainosus, unspecified migraine type - Plan: Ambulatory referral to Neurology [facility] Photophobia - Plan: Ambulatory referral to Neurology  Hidradenitis suppurativa - Plan: spironolactone (ALDACTONE) 50 MG tablet Dr. Mariel Kansky [facility] considering humira in future spironolactone is not helping  Hypertension, unspecified type - Plan: Comprehensive metabolic panel, Lipid panel, CBC with Differential/Platelet Controlled on norvasc 10, losartan 25 and spironolactone 50 mg qd   Prediabetes - Plan: Hemoglobin A1c   Provider: Dr. Olivia Mackie McLean-Scocuzza-Internal Medicine

## 2021-06-12 NOTE — Patient Instructions (Addendum)
01/30/2021 Initial consult Neurology Timoteo Gaul, [place]   [place], [place] [address]   [phone] (Work)   [phone] (Fax)    Adalimumab Injection What is this medication? HUMIRA   ADALIMUMAB (ay da LIM yoo mab) is used to treat rheumatoid and psoriatic arthritis. It is also used to treat ankylosing spondylitis, Crohn's disease, ulcerative colitis, plaque psoriasis, hidradenitis suppurativa, and uveitis. This medicine may be used for other purposes; ask your health care provider or pharmacist if you have questions. COMMON BRAND NAME(S): CYLTEZO, Humira What should I tell my care team before I take this medication? They need to know if you have any of these conditions: cancer diabetes (high blood sugar) having surgery heart disease hepatitis B immune system problems infections, such as tuberculosis (TB) or other bacterial, fungal, or viral infections multiple sclerosis recent or upcoming vaccine an unusual reaction to adalimumab, mannitol, latex, rubber, other medicines, foods, dyes, or preservatives pregnant or trying to get pregnant breast-feeding How should I use this medication? This medicine is for injection under the skin. You will be taught how to prepare and give it. Take it as directed on the prescription label. Keep taking it unless your health care provider tells you to stop. It is important that you put your used needles and syringes in a special sharps container. Do not put them in a trash can. If you do not have a sharps container, call your pharmacist or health care provider to get one. This medicine comes with INSTRUCTIONS FOR USE. Ask your pharmacist for directions on how to use this medicine. Read the information carefully. Talk to your pharmacist or health care provider if you have questions. A special MedGuide will be given to you by the pharmacist with each prescription and refill. Be sure to read this information carefully each  time. Talk to your pediatrician regarding the use of this medicine in children. While this drug may be prescribed for children as young as 2 years for selected conditions, precautions do apply. Overdosage: If you think you have taken too much of this medicine contact a poison control center or emergency room at once. NOTE: This medicine is only for you. Do not share this medicine with others. What if I miss a dose? If you miss a dose, take it as soon as you can. If it is almost time for your next dose, take only that dose. Do not take double or extra doses. It is important not to miss any doses. Talk to your health care provider about what to do if you miss a dose. What may interact with this medication? Do not take this medicine with any of the following medications: abatacept anakinra biologic medicines such as certolizumab, etanercept, golimumab, infliximab live virus vaccines This medicine may also interact with the following medications: cyclosporine theophylline vaccines warfarin This list may not describe all possible interactions. Give your health care provider a list of all the medicines, herbs, non-prescription drugs, or dietary supplements you use. Also tell them if you smoke, drink alcohol, or use illegal drugs. Some items may interact with your medicine. What should I watch for while using this medication? Visit your health care provider for regular checks on your progress. Tell your health care provider if your symptoms do not start to get better or if they get worse. You will be tested for tuberculosis (TB) before you start this medicine. If your doctor prescribes any medicine for TB, you should start taking the TB medicine  before starting this medicine. Make sure to finish the full course of TB medicine. This medicine may increase your risk of getting an infection. Call your health care provider for advice if you get a fever, chills, sore throat, or other symptoms of a cold or  flu. Do not treat yourself. Try to avoid being around people who are sick. Talk to your health care provider about your risk of cancer. You may be more at risk for certain types of cancer if you take this medicine. What side effects may I notice from receiving this medication? Side effects that you should report to your doctor or health care professional as soon as possible: allergic reactions like skin rash, itching or hives, swelling of the face, lips, or tongue changes in vision chest pain dizziness heart failure (trouble breathing; fast, irregular heartbeat; sudden weight gain; swelling of the ankles, feet, hands; unusually weak or tired) infection (fever, chills, cough, sore throat, pain or trouble passing urine) liver injury (dark yellow or brown urine; general ill feeling or flu-like symptoms; loss of appetite, right upper belly pain; unusually weak or tired, yellowing of the eyes or skin) lump or swollen lymph nodes on the neck, groin, or underarm area muscle weakness pain, tingling, numbness in the hands or feet red, scaly patches or raised bumps on the skin trouble breathing unusual bleeding or bruising unusually weak or tired Side effects that usually do not require medical attention (report to your doctor or health care professional if they continue or are bothersome): headache nausea pain, redness, or irritation at site where injected stuffy or runny nose This list may not describe all possible side effects. Call your doctor for medical advice about side effects. You may report side effects to FDA at 1-800-FDA-1088. Where should I keep my medication? Keep out of the reach of children and pets. Store in the refrigerator between 2 and 8 degrees C (36 and 46 degrees F). Do not freeze. Keep this medicine in the original packaging until you are ready to take it. Protect from light. Get rid of any unused medicine after the expiration date. This medicine may be stored at room  temperature for up to 14 days. Keep this medicine in the original packaging. Protect from light. If it is stored at room temperature, get rid of any unused medicine after 14 days or after it expires, whichever is first. To get rid of medicines that are no longer needed or have expired: Take the medicine to a medicine take-back program. Check with your pharmacy or law enforcement to find a location. If you cannot return the medicine, ask your pharmacist or health care provider how to get rid of this medicine safely. NOTE: This sheet is a summary. It may not cover all possible information. If you have questions about this medicine, talk to your doctor, pharmacist, or health care provider.  2022 Elsevier/Gold Standard (2019-10-06 17:28:40)  Hidradenitis Suppurativa Hidradenitis suppurativa is a long-term (chronic) skin disease. It is similar to a severe form of acne, but it affects areas of the body where acne would be unusual, especially areas of the body where skin rubs against skin and becomes moist. These include: Underarms. Groin. Genital area. Buttocks. Upper thighs. Breasts. Hidradenitis suppurativa may start out as small lumps or pimples caused by blocked sweat glands or hair follicles. Pimples may develop into deep sores that break open (rupture) and drain pus. Over time, affected areas of skin may thicken and become scarred. This condition is rare and does  not spread from person to person (non-contagious). What are the causes? The exact cause of this condition is not known. It may be related to: Female and female hormones. An overactive disease-fighting system (immune system). The immune system may over-react to blocked hair follicles or sweat glands and cause swelling and pus-filled sores. What increases the risk? You are more likely to develop this condition if you: Are female. Are 7-3 years old. Have a family history of hidradenitis suppurativa. Have a personal history of  acne. Are overweight. Smoke. Take the medicine lithium. What are the signs or symptoms? The first symptoms are usually painful bumps in the skin, similar to pimples. The condition may get worse over time (progress), or it may only cause mild symptoms. If the disease progresses, symptoms may include: Skin bumps getting bigger and growing deeper into the skin. Bumps rupturing and draining pus. Itchy, infected skin. Skin getting thicker and scarred. Tunnels under the skin (fistulas) where pus drains from a bump. Pain during daily activities, such as pain during walking if your groin area is affected. Emotional problems, such as stress or depression. This condition may affect your appearance and your ability or willingness to wear certain clothes or do certain activities. How is this diagnosed? This condition is diagnosed by a health care provider who specializes in skin diseases (dermatologist). You may be diagnosed based on: Your symptoms and medical history. A physical exam. Testing a pus sample for infection. Blood tests. How is this treated? Your treatment will depend on how severe your symptoms are. The same treatment will not work for everybody with this condition. You may need to try several treatments to find what works best for you. Treatment may include: Cleaning and bandaging (dressing) your wounds as needed. Lifestyle changes, such as new skin care routines. Taking medicines, such as: Antibiotics. Acne medicines. Medicines to reduce the activity of the immune system. A diabetes medicine (metformin). Birth control pills, for women. Steroids to reduce swelling and pain. Working with a mental health care provider, if you experience emotional distress due to this condition. If you have severe symptoms that do not get better with medicine, you may need surgery. Surgery may involve: Using a laser to clear the skin and remove hair follicles. Opening and draining deep  sores. Removing the areas of skin that are diseased and scarred. Follow these instructions at home: Medicines  Take over-the-counter and prescription medicines only as told by your health care provider. If you were prescribed an antibiotic medicine, take it as told by your health care provider. Do not stop taking the antibiotic even if your condition improves. Skin care If you have open wounds, cover them with a clean dressing as told by your health care provider. Keep wounds clean by washing them gently with soap and water when you bathe. Do not shave the areas where you get hidradenitis suppurativa. Do not wear deodorant. Wear loose-fitting clothes. Try to avoid getting overheated or sweaty. If you get sweaty or wet, change into clean, dry clothes as soon as you can. To help relieve pain and itchiness, cover sore areas with a warm, clean washcloth (warm compress) for 5-10 minutes as often as needed. If told by your health care provider, take a bleach bath twice a week: Fill your bathtub halfway with water. Pour in  cup of unscented household bleach. Soak in the tub for 5-10 minutes. Only soak from the neck down. Avoid water on your face and hair. Shower to rinse off the bleach  from your skin. General instructions Learn as much as you can about your disease so that you have an active role in your treatment. Work closely with your health care provider to find treatments that work for you. If you are overweight, work with your health care provider to lose weight as recommended. Do not use any products that contain nicotine or tobacco, such as cigarettes and e-cigarettes. If you need help quitting, ask your health care provider. If you struggle with living with this condition, talk with your health care provider or work with a mental health care provider as recommended. Keep all follow-up visits as told by your health care provider. This is important. Where to find more  information Hidradenitis [place].: https://www.hs-foundation.org/ American Academy of Dermatology: http://www.nguyen-hutchinson.com/ Contact a health care provider if you have: A flare-up of hidradenitis suppurativa. A fever or chills. Trouble controlling your symptoms at home. Trouble doing your daily activities because of your symptoms. Trouble dealing with emotional problems related to your condition. Summary Hidradenitis suppurativa is a long-term (chronic) skin disease. It is similar to a severe form of acne, but it affects areas of the body where acne would be unusual. The first symptoms are usually painful bumps in the skin, similar to pimples. The condition may only cause mild symptoms, or it may get worse over time (progress). If you have open wounds, cover them with a clean dressing as told by your health care provider. Keep wounds clean by washing them gently with soap and water when you bathe. Besides skin care, treatment may include medicines, laser treatment, and surgery. This information is not intended to replace advice given to you by your health care provider. Make sure you discuss any questions you have with your health care provider. Document Revised: 05/22/2020 Document Reviewed: 05/22/2020 Elsevier Patient Education  2022 [employer].

## 2021-06-13 ENCOUNTER — Telehealth: Payer: Self-pay

## 2021-06-13 NOTE — Telephone Encounter (Signed)
-----   Message from Delorise Jackson, MD sent at 06/12/2021  5:38 PM EDT ----- Vitamin D improved continue same dose daily  Sugar was low <70 make sure not to skip meals and keep a snack Bilirubin slightly elevated I am not worried about this only 0.1 higher than normal Cholesterol improved A1c =6.0=prediabetes improved from 6.2  Blood cts normal

## 2021-06-17 DIAGNOSIS — Z1231 Encounter for screening mammogram for malignant neoplasm of breast: Secondary | ICD-10-CM | POA: Diagnosis not present

## 2021-06-17 LAB — HM MAMMOGRAPHY

## 2021-06-18 DIAGNOSIS — K7581 Nonalcoholic steatohepatitis (NASH): Secondary | ICD-10-CM | POA: Diagnosis not present

## 2021-06-18 DIAGNOSIS — M21611 Bunion of right foot: Secondary | ICD-10-CM | POA: Diagnosis not present

## 2021-06-18 DIAGNOSIS — K581 Irritable bowel syndrome with constipation: Secondary | ICD-10-CM | POA: Diagnosis not present

## 2021-06-18 DIAGNOSIS — M6289 Other specified disorders of muscle: Secondary | ICD-10-CM | POA: Diagnosis not present

## 2021-06-24 ENCOUNTER — Encounter: Payer: Self-pay | Admitting: Internal Medicine

## 2021-06-26 DIAGNOSIS — F411 Generalized anxiety disorder: Secondary | ICD-10-CM | POA: Diagnosis not present

## 2021-07-08 ENCOUNTER — Other Ambulatory Visit: Payer: Self-pay

## 2021-07-08 ENCOUNTER — Encounter: Payer: Self-pay | Admitting: Family

## 2021-07-08 ENCOUNTER — Ambulatory Visit (INDEPENDENT_AMBULATORY_CARE_PROVIDER_SITE_OTHER): Payer: BC Managed Care – PPO

## 2021-07-08 ENCOUNTER — Ambulatory Visit (INDEPENDENT_AMBULATORY_CARE_PROVIDER_SITE_OTHER): Payer: BC Managed Care – PPO | Admitting: Family

## 2021-07-08 VITALS — BP 112/76 | HR 89 | Temp 98.5°F | Ht 72.0 in | Wt 183.6 lb

## 2021-07-08 DIAGNOSIS — M25552 Pain in left hip: Secondary | ICD-10-CM | POA: Diagnosis not present

## 2021-07-08 DIAGNOSIS — M5136 Other intervertebral disc degeneration, lumbar region: Secondary | ICD-10-CM | POA: Diagnosis not present

## 2021-07-08 DIAGNOSIS — M79651 Pain in right thigh: Secondary | ICD-10-CM | POA: Diagnosis not present

## 2021-07-08 DIAGNOSIS — M545 Low back pain, unspecified: Secondary | ICD-10-CM

## 2021-07-08 DIAGNOSIS — R2241 Localized swelling, mass and lump, right lower limb: Secondary | ICD-10-CM

## 2021-07-08 DIAGNOSIS — G8929 Other chronic pain: Secondary | ICD-10-CM | POA: Diagnosis not present

## 2021-07-08 DIAGNOSIS — Z96642 Presence of left artificial hip joint: Secondary | ICD-10-CM | POA: Diagnosis not present

## 2021-07-08 DIAGNOSIS — Z96641 Presence of right artificial hip joint: Secondary | ICD-10-CM | POA: Diagnosis not present

## 2021-07-08 DIAGNOSIS — M25551 Pain in right hip: Secondary | ICD-10-CM | POA: Diagnosis not present

## 2021-07-08 DIAGNOSIS — Z471 Aftercare following joint replacement surgery: Secondary | ICD-10-CM | POA: Diagnosis not present

## 2021-07-08 NOTE — Assessment & Plan Note (Signed)
Midline low back pain. Updating lumbar and hip xrays. She follows with orthopedics, [facility].  Close followup and will consider PT, cymbalta.

## 2021-07-08 NOTE — Patient Instructions (Signed)
Xrays today  I placed urgent referral to general surgery to further evaluate with mass on your right thigh.  Let us know if you dont hear back within 2-3 days in regards to an appointment being scheduled.   Let me know of any new concerns

## 2021-07-08 NOTE — Progress Notes (Signed)
Subjective:    Patient ID: [name], female    DOB: Sep 18, 1975, 45 y.o.   MRN: [id number]  CC: [name] is a 45 y.o. female who presents today for an acute visit.    HPI: Acute visit for 'knot on right thigh x 4 months, and has enlarged in size.  Tender. States 'not painful'. More tender when palpated, however tenderness always there.  No numbness, saddle anesthesia, trouble urinating or having a bowel movement fever, redness, fever.   She has lost weight.  Complains of chronic low back pain, waxes and wanes however not sure when started. No injury. No exacerbating or relieving feature. Takes prn ibuprofen with slight relief. Endorses dull right groin pain which is more noticeable when driving.     She uses a cane. Uses stationary bike and walks most days.  Xray left hip, lumbar spine 05/24/21  H/o BCC - following with dermatology , with annual next month.   H/o ovarian cancer ( diagnosed in 20's)   H/o hip congential deformity  and s/p surgery in 2012, and 2013.   Follows with Dr Magda Bernheim , orthopedics last seen 04/2021 with [facility] ( unable to see full note in Epic)  HISTORY:  Past Medical History:  Diagnosis Date   Abdominal pain, epigastric 12/17/2015   Anxiety    pt denies having dx of anxiety   BCC (basal cell carcinoma of skin)    right foot    Bunion, right foot    Chronic cough    Closed fibular fracture 10/2008   Left - Sustained 2/2 fall down stairs (same fall as tibular fracture). S/P internal fixation.   COVID-19    mild back pain, brain fog 01/2021   Duodenitis    EGD (07/14/2011) showing chronic duodenitis, H.pyloir neg   Elevated liver enzymes    Endometriosis    S/P TAH and salpingo-oophorectomy, right   Epigastric pain    Chronic, thought 2/2 gastritis, EGD (07/14/2011) showing chronic duodenitis, H.pyloir neg // Repeat EGD (09/2011) - esophagealring, sessible polyp in stomach body, hiatal hernia - rec ad probiotic, await bx    Esophageal ring    s/p dilatation during EGD (07/2011) - Dr. 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 With chronic OA of hip   History of migraine headaches    Typical symptoms - bright lights, unilateral (starting behind her left ear), throbbing .   Hot flashes    Hypertension    Internal hemorrhoids    Osteoarthritis of hip    Ovarian cancer ([place]) 1995   Protrusio acetabuli    diagnosed at age 52   Sessile colonic polyp    noted 09/2011 -- > rec colonoscopy in 10 years, 2023   Surgical menopause 08/2007   On HRT. Occuring since 01/09 following TAH and R-salpingo-oophorectomy    Tibial fracture 10/2008   Left - Sustained 2/2 fall down stairs. Patient is S/P closed internal fixation with Smith Nephew tibial nail locked proximal and distal   Vitamin D deficiency 11/2009   Vit D level 15 in 11/2009   Past Surgical History:  Procedure Laterality Date   ABDOMINAL HYSTERECTOMY     CESAREAN SECTION  08/2005   Primary  low transverse   COLONOSCOPY  09/22/2011   JOA:CZYSAYT polyp in the rectum/Internal hemorrhoids, benign path   ESOPHAGOGASTRODUODENOSCOPY  09/22/2011   KZS:WFUX, esophageal/Sessile polyp in the body of the stomach/Hiatal hernia/ABDOMINAL PAIN LIKELY FUNCTIONAL V. POST-INFECTIOUS IBS, path: mild chronic gastritis   FRACTURE SURGERY     Right hip   JOINT REPLACEMENT     right hip   LAPAROSCOPY  10/2000   Operative laparoscopy with lysis of right adnexal adhesions and uterointestinal adhesions   left tib fib     OTHER SURGICAL HISTORY  02/2009   Closed treatment internal fixation, left tibia with Tamala Julian and Nephew tibial nail locked proximal and distal   TOTAL ABDOMINAL HYSTERECTOMY W/ BILATERAL SALPINGOOPHORECTOMY  08/2007    for endometriosis. With concurrent right salpingo-oophorectomy  (2009), left oophorectomy (1995)   TUBAL LIGATION  08/2005   Right tubal ligation   Family History  Problem Relation Age of Onset   Other Father        deceased while in service.    Breast cancer Sister 52       remission   Cancer Sister        breast   Lupus Cousin    Other Mother        unknown medical history   Dementia Maternal Grandmother        at old age   Parkinson's disease Maternal Grandfather    Multiple sclerosis Other        uncle    Colon cancer Neg Hx    Liver disease Neg Hx    Migraines Neg Hx     Allergies: Pineapple, Eggs or egg-derived products, Hctz [hydrochlorothiazide], Latex, Lisinopril, Morphine and related, and Tape Current Outpatient Medications on File Prior to Visit  Medication Sig Dispense Refill   amLODipine (NORVASC) 10 MG tablet Take 1 tablet (10 mg total) by mouth daily. 90 tablet 3   hydroquinone 4 % cream Apply topically at bedtime. qhs x 3-4 months then 2x per week as needed 30 g 5   Multiple Vitamin (MULTIVITAMIN PO) Take by mouth.     spironolactone (ALDACTONE) 50 MG tablet Take 1 tablet (50 mg total) by mouth daily. In am 90 tablet 3   VITAMIN E PO Take by mouth.     losartan (COZAAR) 25 MG tablet Take 1 tablet (25 mg total) by mouth daily. Take if BP >130/>80 (Patient not taking: Reported on 07/08/2021) 90 tablet 3   No current facility-administered medications on file prior to visit.    Social History   Tobacco Use   Smoking status: Never   Smokeless tobacco: Never  Vaping Use   Vaping Use: Never used  Substance Use Topics   Alcohol use: No    Alcohol/week: 0.0 standard drinks   Drug use: No    Review of Systems  Constitutional:  Negative for chills and fever.  Respiratory:  Negative for cough.   Cardiovascular:  Negative for chest pain and palpitations.  Gastrointestinal:  Negative for nausea and vomiting.  Musculoskeletal:  Positive for arthralgias and  back pain.  Skin:  Negative for rash.  Neurological:  Negative for numbness.     Objective:    BP 112/76 (BP Location: Left Arm, Patient Position: Sitting, Cuff Size: Normal)   Pulse 89   Temp 98.5 F (36.9 C) (Oral)   Ht 6' (1.829 m)   Wt 183 lb 9.6 oz (83.3 kg)   SpO2 97%   BMI 24.90 kg/m   Wt Readings from  Last 3 Encounters:  07/08/21 183 lb 9.6 oz (83.3 kg)  06/12/21 191 lb 9.6 oz (86.9 kg)  04/03/21 190 lb (86.2 kg)    Physical Exam Vitals reviewed.  Constitutional:      Appearance: She is well-developed.  Eyes:     Conjunctiva/sclera: Conjunctivae normal.  Cardiovascular:     Rate and Rhythm: Normal rate and regular rhythm.     Pulses: Normal pulses.     Heart sounds: Normal heart sounds.  Pulmonary:     Effort: Pulmonary effort is normal.     Breath sounds: Normal breath sounds. No wheezing, rhonchi or rales.  Musculoskeletal:     Lumbar back: No swelling, edema, spasms, tenderness or bony tenderness. Normal range of motion.     Right lower leg: No swelling.     Left lower leg: No swelling.       Legs:     Comments: Right lateral 2 to 3 cm circumscribed mass, tender appreciated right lateral thigh.  No erythema.  Area is nonfluctuant.  No red streaks.   Full range of motion with flexion, tension, lateral side bends. No bony tenderness. No pain, numbness, tingling elicited with single leg raise bilaterally.   Right Hip: No limp or waddling gait. Decreased ROM with hip rotation in flexion.    No pain of lateral hip with  (flexion-abduction-external rotation) test.   No pain with deep palpation of greater trochanter.     Skin:    General: Skin is warm and dry.  Neurological:     Mental Status: She is alert.     Sensory: No sensory deficit.     Deep Tendon Reflexes:     Reflex Scores:      Patellar reflexes are 2+ on the right side and 2+ on the left side.    Comments: Sensation and strength intact bilateral lower extremities.  Psychiatric:         Speech: Speech normal.        Behavior: Behavior normal.        Thought Content: Thought content normal.       Assessment & Plan:   Problem List Items Addressed This Visit       Other   Chronic pain of both hips    Chronic.  Patient has a history hip congenital deformity, status post bilateral hip arthroplasty. Pending updating hip xrays.  She will continue to follow-up with Dr. Redmond Pulling, orthopedics at [facility].       Low back pain - Primary    Midline low back pain. Updating lumbar and hip xrays. She follows with orthopedics, [facility].  Close followup and will consider PT, cymbalta.       Relevant Orders   DG Hip Unilat W OR W/O Pelvis 2-3 Views Right   DG Hip Unilat W OR W/O Pelvis 2-3 Views Left   DG Lumbar Spine Complete   Mass of right thigh    Tender, circumscribed mass on exam today.  X-ray right femur unrevealing of soft tissue mass. Atypical to have tenderness with lipoma. H/o BCC , ovarian cancer.  Urgent referral to general surgery for further consult so patient may have ultrasound in office to discriminate if this is benign etiology such as lipoma and to rule malignancy, including sarcoma. May opt to perform further imaging ( CT)  right leg ahead of general surgery once appointment is made with surgery to expedite evaluation.  She also has dermatology appointment 07/15/2021 I have also advised her the importance of  mentioning this pain specifically dermatology with her h/o BCC.         Relevant Orders   DG FEMUR, MIN 2 VIEWS RIGHT (Completed)   Ambulatory referral to General Surgery       I am having Abi R. [name] maintain her VITAMIN E PO, Multiple Vitamin (MULTIVITAMIN PO), hydroquinone, spironolactone, losartan, and amLODipine.   No orders of the defined types were placed in this encounter.   Return precautions given.   Risks, benefits, and alternatives of the medications and treatment plan prescribed today were discussed, and patient expressed  understanding.   Education regarding symptom management and diagnosis given to patient on AVS.  Continue to follow with McLean-Scocuzza, Nino Glow, MD for routine health maintenance.   Jasmia R [name] and I agreed with plan.   Mable Paris, FNP

## 2021-07-08 NOTE — Assessment & Plan Note (Addendum)
Tender, circumscribed mass on exam today.  X-ray right femur unrevealing of soft tissue mass. Atypical to have tenderness with lipoma. H/o BCC , ovarian cancer.  Urgent referral to general surgery for further consult so patient may have ultrasound in office to discriminate if this is benign etiology such as lipoma and to rule malignancy, including sarcoma. May opt to perform further imaging ( CT)  right leg ahead of general surgery once appointment is made with surgery to expedite evaluation.  She also has dermatology appointment 07/15/2021 I have also advised her the importance of mentioning this pain specifically dermatology with her h/o BCC.

## 2021-07-09 NOTE — Assessment & Plan Note (Signed)
Chronic.  Patient has a history hip congenital deformity, status post bilateral hip arthroplasty. Pending updating hip xrays.  She will continue to follow-up with Dr. Redmond Pulling, orthopedics at [place].

## 2021-07-12 ENCOUNTER — Telehealth: Payer: Self-pay | Admitting: Plastic Surgery

## 2021-07-12 NOTE — Telephone Encounter (Signed)
Attempted to call patient to advise of insurance denial to the appeal that was submitted. Per letter from insurance, request has been denied due to "the records do not show that you have very large breasts. The photographs do not show shoulder grooving. As such, criteria are not met." Unable to leave message as the memory is full for the number on file.

## 2021-07-13 ENCOUNTER — Other Ambulatory Visit: Payer: Self-pay | Admitting: Internal Medicine

## 2021-07-13 DIAGNOSIS — L811 Chloasma: Secondary | ICD-10-CM

## 2021-07-15 DIAGNOSIS — L732 Hidradenitis suppurativa: Secondary | ICD-10-CM | POA: Diagnosis not present

## 2021-07-15 DIAGNOSIS — L811 Chloasma: Secondary | ICD-10-CM | POA: Diagnosis not present

## 2021-07-15 DIAGNOSIS — R229 Localized swelling, mass and lump, unspecified: Secondary | ICD-10-CM | POA: Diagnosis not present

## 2021-07-22 DIAGNOSIS — U099 Post covid-19 condition, unspecified: Secondary | ICD-10-CM | POA: Diagnosis not present

## 2021-07-25 ENCOUNTER — Other Ambulatory Visit: Payer: Self-pay

## 2021-07-25 ENCOUNTER — Ambulatory Visit (INDEPENDENT_AMBULATORY_CARE_PROVIDER_SITE_OTHER): Payer: BC Managed Care – PPO | Admitting: Adult Health

## 2021-07-25 ENCOUNTER — Encounter: Payer: Self-pay | Admitting: Adult Health

## 2021-07-25 VITALS — BP 134/82 | HR 58 | Ht 72.01 in | Wt 192.4 lb

## 2021-07-25 DIAGNOSIS — R6883 Chills (without fever): Secondary | ICD-10-CM

## 2021-07-25 DIAGNOSIS — J0140 Acute pansinusitis, unspecified: Secondary | ICD-10-CM | POA: Diagnosis not present

## 2021-07-25 LAB — CBC WITH DIFFERENTIAL/PLATELET
Basophils Absolute: 0 10*3/uL (ref 0.0–0.1)
Basophils Relative: 0.3 % (ref 0.0–3.0)
Eosinophils Absolute: 0.1 10*3/uL (ref 0.0–0.7)
Eosinophils Relative: 0.9 % (ref 0.0–5.0)
HCT: 37.4 % (ref 36.0–46.0)
Hemoglobin: 12.2 g/dL (ref 12.0–15.0)
Lymphocytes Relative: 22.9 % (ref 12.0–46.0)
Lymphs Abs: 2 10*3/uL (ref 0.7–4.0)
MCHC: 32.5 g/dL (ref 30.0–36.0)
MCV: 84.1 fl (ref 78.0–100.0)
Monocytes Absolute: 0.7 10*3/uL (ref 0.1–1.0)
Monocytes Relative: 7.9 % (ref 3.0–12.0)
Neutro Abs: 6 10*3/uL (ref 1.4–7.7)
Neutrophils Relative %: 68 % (ref 43.0–77.0)
Platelets: 306 10*3/uL (ref 150.0–400.0)
RBC: 4.45 Mil/uL (ref 3.87–5.11)
RDW: 13.9 % (ref 11.5–15.5)
WBC: 8.8 10*3/uL (ref 4.0–10.5)

## 2021-07-25 MED ORDER — AMOXICILLIN-POT CLAVULANATE 875-125 MG PO TABS: 1.0000 | ORAL_TABLET | Freq: Two times a day (BID) | ORAL | 0 refills | Status: DC

## 2021-07-25 MED ORDER — PREDNISONE 10 MG (21) PO TBPK: ORAL_TABLET | ORAL | 0 refills | Status: DC

## 2021-07-25 NOTE — Progress Notes (Signed)
Acute Office Visit  Subjective:    Patient ID: [name], female    DOB: 1976-03-26, 45 y.o.   MRN: [id number]  Chief Complaint  Patient presents with   Sinusitis    Pt is having symptoms of headache, pain and swelling in the face,dizziness, pain in back of neck and metallic taste. Symptoms started 12/1.    Sinusitis Associated symptoms include congestion, headaches, sinus pressure and a sore throat. Pertinent negatives include no chills or diaphoresis.  Patient is in today for frontal sinus pressure  headache, she has an abnormal taste that started 1 week ago and has metallic taste in her mouth. Sinus pressure and congestion started 2 weeks ago. She has been trying flonase.  Denies any worst headache of her life or thunderclap headache.  Denies any weakness. She has had chills that started off and on for over this week.  She denies any recent illness or any other recent antibiotics. Has doxycycline for dermatitis inflammation for dermatologist.  History of long covid. - June 2022 was covid last.   Sees neurology for headaches, and she saw neurology, for memory issues. She was referred to a covid recovery clinic she reports by neurologist.   Patient  denies any fever, body aches,chills, rash, chest pain, shortness of breath, nausea, vomiting, or diarrhea.  Denies dizziness, lightheadedness, pre syncopal or syncopal episodes.   Past Medical History:  Diagnosis Date   Abdominal pain, epigastric 12/17/2015   Anxiety    pt denies having dx of anxiety   BCC (basal cell carcinoma of skin)    right foot    Bunion, right foot    Chronic cough    Closed fibular fracture 10/2008   Left - Sustained 2/2 fall down stairs (same fall as tibular fracture). S/P internal fixation.   COVID-19    mild back pain, brain fog 01/2021   Duodenitis    EGD (07/14/2011) showing chronic duodenitis, H.pyloir neg   Elevated liver enzymes    Endometriosis    S/P TAH and salpingo-oophorectomy,  right   Epigastric pain    Chronic, thought 2/2 gastritis, EGD (07/14/2011) showing chronic duodenitis, H.pyloir neg // Repeat EGD (09/2011) - esophagealring, sessible polyp in stomach body, hiatal hernia - rec ad probiotic, await bx   Esophageal ring    s/p dilatation during EGD (07/2011) - Dr. 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 With chronic OA of hip   History of migraine headaches    Typical symptoms - bright lights, unilateral (starting behind her left ear), throbbing .   Hot flashes    Hypertension    Internal hemorrhoids    Osteoarthritis of hip    Ovarian cancer ([place]) 1995   Protrusio acetabuli    diagnosed at age 24   Sessile colonic polyp    noted 09/2011 -- > rec colonoscopy in 10 years, 2023   Surgical menopause 08/2007   On HRT. Occuring since 01/09 following TAH and R-salpingo-oophorectomy    Tibial fracture 10/2008   Left - Sustained 2/2 fall down stairs. Patient is S/P closed internal fixation with Smith Nephew tibial nail locked proximal and distal   Vitamin D deficiency 11/2009   Vit D level 15 in 11/2009  Past Surgical History:  Procedure Laterality Date   ABDOMINAL HYSTERECTOMY     CESAREAN SECTION  08/2005   Primary low transverse   COLONOSCOPY  09/22/2011   ZHY:QMVHQIO polyp in the rectum/Internal hemorrhoids, benign path   ESOPHAGOGASTRODUODENOSCOPY  09/22/2011   NGE:XBMW, esophageal/Sessile polyp in the body of the stomach/Hiatal hernia/ABDOMINAL PAIN LIKELY FUNCTIONAL V. POST-INFECTIOUS IBS, path: mild chronic gastritis   FRACTURE SURGERY     Right hip   JOINT REPLACEMENT     right hip   LAPAROSCOPY  10/2000   Operative laparoscopy with lysis of right adnexal adhesions and uterointestinal adhesions    left tib fib     OTHER SURGICAL HISTORY  02/2009   Closed treatment internal fixation, left tibia with Tamala Julian and Nephew tibial nail locked proximal and distal   TOTAL ABDOMINAL HYSTERECTOMY W/ BILATERAL SALPINGOOPHORECTOMY  08/2007   for endometriosis. With concurrent right salpingo-oophorectomy  (2009), left oophorectomy (1995)   TUBAL LIGATION  08/2005   Right tubal ligation    Family History  Problem Relation Age of Onset   Other Father        deceased while in service.    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   Patient is married with two children, Madison and Tamera Punt (son 32 y.o as of 03/22/20 going to [employer]).    She lives in [place] but children go to school in [place]    Right handed   Caffeine: no   Social Determinants of Health   Financial Resource Strain: Not on file  Food Insecurity: Not on file  Transportation Needs: Not on file  Physical Activity: Not on file  Stress: Not on file  Social Connections: Not on file  Intimate Partner Violence: Not on file     Outpatient Medications Prior to Visit  Medication Sig Dispense Refill   amLODipine (NORVASC) 10 MG tablet Take 1 tablet (10 mg total) by mouth daily. 90 tablet 3   hydroquinone 4 % cream Apply topically at bedtime. qhs x 3-4 months then 2x per week as needed 30 g 5   losartan (COZAAR) 25 MG tablet Take 1 tablet (25 mg total) by mouth daily. Take if BP >130/>80 90 tablet 3   Multiple Vitamin (MULTIVITAMIN  PO) Take by mouth.     spironolactone (ALDACTONE) 50 MG tablet Take 1 tablet (50 mg total) by mouth daily. In am 90 tablet 3   VITAMIN E PO Take by mouth.     No facility-administered medications prior to visit.    Allergies  Allergen Reactions   Pineapple Shortness Of Breath and Swelling    Has Epi pen   Eggs Or Egg-Derived Products     GI issues    Hctz [Hydrochlorothiazide]     Headaches, foggy headed     Latex Swelling   Lisinopril     Cough    Morphine And Related Itching   Tape Swelling    Review of Systems  Constitutional:  Positive for fatigue. Negative for activity change, appetite change, chills, diaphoresis, fever and unexpected weight change.  HENT:  Positive for congestion, postnasal drip, sinus pressure and sore throat.   Respiratory: Negative.    Cardiovascular: Negative.   Gastrointestinal: Negative.   Genitourinary: Negative.   Neurological:  Positive for headaches. Negative for dizziness, tremors, seizures, syncope, facial asymmetry, speech difficulty, weakness, light-headedness and numbness.      Objective:    Physical Exam Vitals reviewed.  Constitutional:      General: She is not in acute distress.    Appearance: She is well-developed. She is not ill-appearing, toxic-appearing or diaphoretic.  HENT:     Head: Normocephalic and atraumatic.     Jaw: There is normal jaw occlusion.     Salivary Glands: Right salivary gland is not diffusely enlarged or tender. Left salivary gland is not diffusely enlarged or tender.     Right Ear: Hearing, ear  canal and external ear normal. A middle ear effusion is present. Tympanic membrane is erythematous. Tympanic membrane is not perforated.     Left Ear: Hearing, ear canal and external ear normal. A middle ear effusion is present. Tympanic membrane is erythematous. Tympanic membrane is not perforated.     Nose: Mucosal edema, congestion and rhinorrhea present.     Right Sinus: Maxillary sinus tenderness and frontal sinus tenderness present.     Left Sinus: Maxillary sinus tenderness and frontal sinus tenderness present.     Mouth/Throat:     Lips: Pink.     Mouth: Mucous membranes are moist.     Pharynx: Oropharynx is clear. Uvula midline. Posterior oropharyngeal erythema present. No oropharyngeal exudate.     Tonsils: No tonsillar exudate or tonsillar abscesses. 2+ on the right. 0 on the left.  Eyes:     General: Lids are normal. No scleral icterus.       Right eye: No discharge.        Left eye: No discharge.     Conjunctiva/sclera: Conjunctivae normal.     Pupils: Pupils are equal, round, and reactive to light.  Neck:     Vascular: No JVD.     Trachea: Trachea normal. No tracheal deviation.     Meningeal: Brudzinski's sign absent.  Cardiovascular:     Rate and Rhythm: Normal rate and regular rhythm.     Heart sounds: Normal heart sounds. No murmur heard.   No friction rub. No gallop.  Pulmonary:     Effort: Pulmonary effort is normal. No respiratory distress.     Breath sounds: Normal breath sounds. No stridor, decreased air movement or transmitted upper airway sounds. No decreased breath sounds, wheezing, rhonchi or rales.  Chest:     Chest wall: No tenderness.  Abdominal:     General:  Bowel sounds are normal.     Palpations: Abdomen is soft.  Musculoskeletal:        General: Normal range of motion.     Cervical back: Full passive range of motion without pain, normal range of motion and neck supple.  Lymphadenopathy:     Head:     Right side of head: No submental,  submandibular, tonsillar, preauricular, posterior auricular or occipital adenopathy.     Left side of head: No submental, submandibular, tonsillar, preauricular, posterior auricular or occipital adenopathy.     Cervical: No cervical adenopathy.  Skin:    General: Skin is warm and dry.     Capillary Refill: Capillary refill takes less than 2 seconds.     Coloration: Skin is not pale.     Findings: No erythema or rash.  Neurological:     Mental Status: She is alert and oriented to person, place, and time.  Psychiatric:        Speech: Speech normal.        Behavior: Behavior normal.        Thought Content: Thought content normal.        Judgment: Judgment normal.    BP 134/82    Pulse (!) 58    Ht 6' 0.01" (1.829 m)    Wt 192 lb 6.4 oz (87.3 kg)    SpO2 99%    BMI 26.09 kg/m  Wt Readings from Last 3 Encounters:  07/25/21 192 lb 6.4 oz (87.3 kg)  07/08/21 183 lb 9.6 oz (83.3 kg)  06/12/21 191 lb 9.6 oz (86.9 kg)    There are no preventive care reminders to display for this patient.  There are no preventive care reminders to display for this patient.   Lab Results  Component Value Date   TSH 1.64 11/01/2020   Lab Results  Component Value Date   WBC 10.3 06/12/2021   HGB 12.2 06/12/2021   HCT 38.2 06/12/2021   MCV 85.1 06/12/2021   PLT 309.0 06/12/2021   Lab Results  Component Value Date   NA 140 06/12/2021   K 4.2 06/12/2021   CO2 27 06/12/2021   GLUCOSE 67 (L) 06/12/2021   BUN 11 06/12/2021   CREATININE 0.97 06/12/2021   BILITOT 1.3 (H) 06/12/2021   ALKPHOS 72 06/12/2021   AST 23 06/12/2021   ALT 18 06/12/2021   PROT 7.5 06/12/2021   ALBUMIN 4.5 06/12/2021   CALCIUM 10.2 06/12/2021   ANIONGAP 9 11/26/2019   GFR 69.62 06/12/2021   Lab Results  Component Value Date   CHOL 170 06/12/2021   Lab Results  Component Value Date   HDL 57.90 06/12/2021   Lab Results  Component Value Date   LDLCALC 96 06/12/2021   Lab Results  Component Value Date   TRIG  77.0 06/12/2021   Lab Results  Component Value Date   CHOLHDL 3 06/12/2021   Lab Results  Component Value Date   HGBA1C 6.0 06/12/2021       Assessment & Plan:   Problem List Items Addressed This Visit   None Visit Diagnoses     Acute non-recurrent pansinusitis    -  Primary   Relevant Medications   amoxicillin-clavulanate (AUGMENTIN) 875-125 MG tablet   predniSONE (STERAPRED UNI-PAK 21 TAB) 10 MG (21) TBPK tablet   Other Relevant Orders   CBC with Differential/Platelet   Chills       Relevant Orders   COVID-19, Flu A+B and RSV  Send off test for COVID flu and RSV CBC today since she has recently started with chills and last week.  However she reports she has had the symptoms for over 2 weeks we will treat with Augmentin discussed side effects of possible C. difficile, take with probiotic, eat with food.  Prednisone for sinus pain and pressure.  Meds ordered this encounter  Medications   amoxicillin-clavulanate (AUGMENTIN) 875-125 MG tablet    Sig: Take 1 tablet by mouth 2 (two) times daily.    Dispense:  20 tablet    Refill:  0   predniSONE (STERAPRED UNI-PAK 21 TAB) 10 MG (21) TBPK tablet    Sig: PO: Take 6 tablets on day 1:Take 5 tablets day 2:Take 4 tablets day 3: Take 3 tablets day 4:Take 2 tablets day five: 5 Take 1 tablet day 6    Dispense:  21 tablet    Refill:  0   Red Flags discussed. The patient was given clear instructions to go to ER or return to medical center if any red flags develop, symptoms do not improve, worsen or new problems develop. They verbalized understanding.  Return if symptoms worsen or fail to improve, for at any time for any worsening symptoms.    Marcille Buffy, FNP

## 2021-07-25 NOTE — Progress Notes (Signed)
CBC is within normal limits.

## 2021-07-25 NOTE — Patient Instructions (Signed)
Prednisolone Tablets What is this medication? PREDNISOLONE (pred NISS oh lone) treats many conditions such as asthma, allergic reactions, arthritis, inflammatory bowel diseases, adrenal, and blood or bone marrow disorders. It works by decreasing inflammation, slowing down an overactive immune system, or replacing cortisol normally made in the body. Cortisol is a hormone that plays an important role in how the body responds to stress, illness, and injury. It belongs to a group of medications called steroids. This medicine may be used for other purposes; ask your health care provider or pharmacist if you have questions. COMMON BRAND NAME(S): Millipred, Millipred DP, Millipred DP 12-Day, Millipred DP 6 Day, Prednoral What should I tell my care team before I take this medication? They need to know if you have any of these conditions: Cushing's syndrome Diabetes Glaucoma Heart problems or disease High blood pressure Infection such as herpes, measles, tuberculosis, or chickenpox Kidney disease Liver disease Mental problems Myasthenia gravis Osteoporosis Seizures Stomach ulcer or intestine disease including colitis and diverticulitis Thyroid problem An unusual or allergic reaction to lactose, prednisolone, other medications, foods, dyes, or preservatives Pregnant or trying to get pregnant Breast-feeding How should I use this medication? Take this medication by mouth with a glass of water. Follow the directions on the prescription label. Take it with food or milk to avoid stomach upset. If you are taking this medication once a day, take it in the morning. Do not take more medication than you are told to take. Do not suddenly stop taking your medication because you may develop a severe reaction. Your care team will tell you how much medication to take. If your care team wants you to stop the medication, the dose may be slowly lowered over time to avoid any side effects. Talk to your care team about  the use of this medication in children. Special care may be needed. Overdosage: If you think you have taken too much of this medicine contact a poison control center or emergency room at once. NOTE: This medicine is only for you. Do not share this medicine with others. What if I miss a dose? If you miss a dose, take it as soon as you can. If it is almost time for your next dose, take only that dose. Do not take double or extra doses. What may interact with this medication? Do not take this medication with any of the following: Metyrapone Mifepristone This medication may also interact with the following: Aminoglutethimide Amphotericin B Aspirin and aspirin-like medications Barbiturates Certain medications for diabetes, like glipizide or glyburide Cholestyramine Cholinesterase inhibitors Cyclosporine Digoxin Diuretics Ephedrine Female hormones, like estrogens and birth control pills Isoniazid Ketoconazole NSAIDS, medications for pain and inflammation, like ibuprofen or naproxen Phenytoin Rifampin Toxoids Vaccines Warfarin This list may not describe all possible interactions. Give your health care provider a list of all the medicines, herbs, non-prescription drugs, or dietary supplements you use. Also tell them if you smoke, drink alcohol, or use illegal drugs. Some items may interact with your medicine. What should I watch for while using this medication? Visit your care team for regular checks on your progress. If you are taking this medication over a prolonged period, carry an identification card with your name and address, the type and dose of your medication, and your care team's name and address. This medication may increase your risk of getting an infection. Tell your care team if you are around anyone with measles or chickenpox, or if you develop sores or blisters that do not heal properly.  If you are going to have surgery, tell your care team that you have taken this  medication within the last twelve months. Ask your care team about your diet. You may need to lower the amount of salt you eat. This medication may increase blood sugar. Ask your care team if changes in diet or medications are needed if you have diabetes. What side effects may I notice from receiving this medication? Side effects that you should report to your care team as soon as possible: Allergic reactions--skin rash, itching, hives, swelling of the face, lips, tongue, or throat Cushing syndrome--increased fat around the midsection, upper back, neck, or face, pink or purple stretch marks on the skin, thinning, fragile skin that easily bruises, unexpected hair growth High blood sugar (hyperglycemia)--increased thirst or amount of urine, unusual weakness or fatigue, blurry vision Increase in blood pressure Infection--fever, chills, cough, sore throat, wounds that don't heal, pain or trouble when passing urine, general feeling of discomfort or being unwell Low adrenal gland function--nausea, vomiting, loss of appetite, unusual weakness or fatigue, dizziness Mood and behavior changes--anxiety, nervousness, confusion, hallucinations, irritability, hostility, thoughts of suicide or self-harm, worsening mood, feelings of depression Stomach bleeding--bloody or black, tar-like stools, vomiting blood or brown material that looks like coffee grounds Swelling of the ankles, hands, or feet Side effects that usually do not require medical attention (report to your care team if they continue or are bothersome): Acne General discomfort and fatigue Headache Increase in appetite Nausea Trouble sleeping Weight gain This list may not describe all possible side effects. Call your doctor for medical advice about side effects. You may report side effects to FDA at 1-800-FDA-1088. Where should I keep my medication? Keep out of the reach of children. Store at room temperature between 15 and 30 degrees C (59 and  86 degrees F). Keep container tightly closed. Throw away any unused medication after the expiration date. NOTE: This sheet is a summary. It may not cover all possible information. If you have questions about this medicine, talk to your doctor, pharmacist, or health care provider.  2022 Elsevier/Gold Standard (2020-10-26 00:00:00) Amoxicillin; Clavulanic Acid Tablets What is this medication? AMOXICILLIN; CLAVULANIC ACID (a mox i SIL in; KLAV yoo lan ic AS id) treats infections caused by bacteria. It belongs to a group of medications called penicillin antibiotics. It will not treat colds, the flu, or infections caused by viruses. This medicine may be used for other purposes; ask your health care provider or pharmacist if you have questions. COMMON BRAND NAME(S): Augmentin What should I tell my care team before I take this medication? They need to know if you have any of these conditions: Kidney disease Liver disease Mononucleosis Stomach or intestine problems such as colitis An unusual or allergic reaction to amoxicillin, other penicillin or cephalosporin antibiotics, clavulanic acid, other medications, foods, dyes, or preservatives Pregnant or trying to get pregnant Breast-feeding How should I use this medication? Take this medication by mouth. Take it as directed on the prescription label at the same time every day. Take it with food at the start of a meal or snack. Take all of this medication unless your care team tells you to stop it early. Keep taking it even if you think you are better. Talk to your care team about the use of this medication in children. While it may be prescribed for selected conditions, precautions do apply. Overdosage: If you think you have taken too much of this medicine contact a poison control center or  emergency room at once. NOTE: This medicine is only for you. Do not share this medicine with others. What if I miss a dose? If you miss a dose, take it as soon as  you can. If it is almost time for your next dose, take only that dose. Do not take double or extra doses. What may interact with this medication? Allopurinol Anticoagulants Birth control pills Methotrexate Probenecid This list may not describe all possible interactions. Give your health care provider a list of all the medicines, herbs, non-prescription drugs, or dietary supplements you use. Also tell them if you smoke, drink alcohol, or use illegal drugs. Some items may interact with your medicine. What should I watch for while using this medication? Tell your care team if your symptoms do not start to get better or if they get worse. This medication may cause serious skin reactions. They can happen weeks to months after starting the medication. Contact your care team right away if you notice fevers or flu-like symptoms with a rash. The rash may be red or purple and then turn into blisters or peeling of the skin. Or, you might notice a red rash with swelling of the face, lips or lymph nodes in your neck or under your arms. Do not treat diarrhea with over the counter products. Contact your care team if you have diarrhea that lasts more than 2 days or if it is severe and watery. If you have diabetes, you may get a false-positive result for sugar in your urine. Check with your care team. Birth control may not work properly while you are taking this medication. Talk to your care team about using an extra method of birth control. What side effects may I notice from receiving this medication? Side effects that you should report to your care team as soon as possible: Allergic reactions--skin rash, itching, hives, swelling of the face, lips, tongue, or throat Liver injury--right upper belly pain, loss of appetite, nausea, light-colored stool, dark yellow or brown urine, yellowing skin or eyes, unusual weakness or fatigue Redness, blistering, peeling, or loosening of the skin, including inside the  mouth Severe diarrhea, fever Unusual vaginal discharge, itching, or odor Side effects that usually do not require medical attention (report to your care team if they continue or are bothersome): Diarrhea Nausea Vomiting This list may not describe all possible side effects. Call your doctor for medical advice about side effects. You may report side effects to FDA at 1-800-FDA-1088. Where should I keep my medication? Keep out of the reach of children and pets. Store at room temperature between 20 and 25 degrees C (68 and 77 degrees F). Throw away any unused medication after the expiration date. NOTE: This sheet is a summary. It may not cover all possible information. If you have questions about this medicine, talk to your doctor, pharmacist, or health care provider.  2022 Elsevier/Gold Standard (2020-07-22 00:00:00) Sinusitis, Adult Sinusitis is inflammation of your sinuses. Sinuses are hollow spaces in the bones around your face. Your sinuses are located: Around your eyes. In the middle of your forehead. Behind your nose. In your cheekbones. Mucus normally drains out of your sinuses. When your nasal tissues become inflamed or swollen, mucus can become trapped or blocked. This allows bacteria, viruses, and fungi to grow, which leads to infection. Most infections of the sinuses are caused by a virus. Sinusitis can develop quickly. It can last for up to 4 weeks (acute) or for more than 12 weeks (chronic). Sinusitis often  develops after a cold. What are the causes? This condition is caused by anything that creates swelling in the sinuses or stops mucus from draining. This includes: Allergies. Asthma. Infection from bacteria or viruses. Deformities or blockages in your nose or sinuses. Abnormal growths in the nose (nasal polyps). Pollutants, such as chemicals or irritants in the air. Infection from fungi (rare). What increases the risk? You are more likely to develop this condition if  you: Have a weak body defense system (immune system). Do a lot of swimming or diving. Overuse nasal sprays. Smoke. What are the signs or symptoms? The main symptoms of this condition are pain and a feeling of pressure around the affected sinuses. Other symptoms include: Stuffy nose or congestion. Thick drainage from your nose. Swelling and warmth over the affected sinuses. Headache. Upper toothache. A cough that may get worse at night. Extra mucus that collects in the throat or the back of the nose (postnasal drip). Decreased sense of smell and taste. Fatigue. A fever. Sore throat. Bad breath. How is this diagnosed? This condition is diagnosed based on: Your symptoms. Your medical history. A physical exam. Tests to find out if your condition is acute or chronic. This may include: Checking your nose for nasal polyps. Viewing your sinuses using a device that has a light (endoscope). Testing for allergies or bacteria. Imaging tests, such as an MRI or CT scan. In rare cases, a bone biopsy may be done to rule out more serious types of fungal sinus disease. How is this treated? Treatment for sinusitis depends on the cause and whether your condition is chronic or acute. If caused by a virus, your symptoms should go away on their own within 10 days. You may be given medicines to relieve symptoms. They include: Medicines that shrink swollen nasal passages (topical intranasal decongestants). Medicines that treat allergies (antihistamines). A spray that eases inflammation of the nostrils (topical intranasal corticosteroids). Rinses that help get rid of thick mucus in your nose (nasal saline washes). If caused by bacteria, your health care provider may recommend waiting to see if your symptoms improve. Most bacterial infections will get better without antibiotic medicine. You may be given antibiotics if you have: A severe infection. A weak immune system. If caused by narrow nasal passages  or nasal polyps, you may need to have surgery. Follow these instructions at home: Medicines Take, use, or apply over-the-counter and prescription medicines only as told by your health care provider. These may include nasal sprays. If you were prescribed an antibiotic medicine, take it as told by your health care provider. Do not stop taking the antibiotic even if you start to feel better. Hydrate and humidify  Drink enough fluid to keep your urine pale yellow. Staying hydrated will help to thin your mucus. Use a cool mist humidifier to keep the humidity level in your home above 50%. Inhale steam for 10-15 minutes, 3-4 times a day, or as told by your health care provider. You can do this in the bathroom while a hot shower is running. Limit your exposure to cool or dry air. Rest Rest as much as possible. Sleep with your head raised (elevated). Make sure you get enough sleep each night. General instructions  Apply a warm, moist washcloth to your face 3-4 times a day or as told by your health care provider. This will help with discomfort. Wash your hands often with soap and water to reduce your exposure to germs. If soap and water are not available, use  hand sanitizer. Do not smoke. Avoid being around people who are smoking (secondhand smoke). Keep all follow-up visits as told by your health care provider. This is important. Contact a health care provider if: You have a fever. Your symptoms get worse. Your symptoms do not improve within 10 days. Get help right away if: You have a severe headache. You have persistent vomiting. You have severe pain or swelling around your face or eyes. You have vision problems. You develop confusion. Your neck is stiff. You have trouble breathing. Summary Sinusitis is soreness and inflammation of your sinuses. Sinuses are hollow spaces in the bones around your face. This condition is caused by nasal tissues that become inflamed or swollen. The swelling  traps or blocks the flow of mucus. This allows bacteria, viruses, and fungi to grow, which leads to infection. If you were prescribed an antibiotic medicine, take it as told by your health care provider. Do not stop taking the antibiotic even if you start to feel better. Keep all follow-up visits as told by your health care provider. This is important. This information is not intended to replace advice given to you by your health care provider. Make sure you discuss any questions you have with your health care provider. Document Revised: 12/28/2017 Document Reviewed: 12/28/2017 Elsevier Patient Education  2022 [employer].

## 2021-07-26 LAB — COVID-19, FLU A+B AND RSV
Influenza A, NAA: NOT DETECTED
Influenza B, NAA: NOT DETECTED
RSV, NAA: NOT DETECTED
SARS-CoV-2, NAA: NOT DETECTED

## 2021-07-26 NOTE — Progress Notes (Signed)
Negative testing for flu A and B as well as RSV and COVID.  CBC is within normal limits.

## 2021-08-07 ENCOUNTER — Ambulatory Visit (INDEPENDENT_AMBULATORY_CARE_PROVIDER_SITE_OTHER): Payer: BC Managed Care – PPO | Admitting: Internal Medicine

## 2021-08-07 ENCOUNTER — Encounter: Payer: Self-pay | Admitting: Internal Medicine

## 2021-08-07 ENCOUNTER — Other Ambulatory Visit: Payer: Self-pay

## 2021-08-07 VITALS — BP 122/70 | HR 61 | Temp 97.3°F | Ht 72.01 in | Wt 192.0 lb

## 2021-08-07 DIAGNOSIS — U099 Post covid-19 condition, unspecified: Secondary | ICD-10-CM

## 2021-08-07 DIAGNOSIS — J320 Chronic maxillary sinusitis: Secondary | ICD-10-CM | POA: Diagnosis not present

## 2021-08-07 DIAGNOSIS — H9202 Otalgia, left ear: Secondary | ICD-10-CM

## 2021-08-07 DIAGNOSIS — R413 Other amnesia: Secondary | ICD-10-CM | POA: Diagnosis not present

## 2021-08-07 DIAGNOSIS — M62838 Other muscle spasm: Secondary | ICD-10-CM | POA: Diagnosis not present

## 2021-08-07 DIAGNOSIS — M47812 Spondylosis without myelopathy or radiculopathy, cervical region: Secondary | ICD-10-CM | POA: Insufficient documentation

## 2021-08-07 DIAGNOSIS — J3489 Other specified disorders of nose and nasal sinuses: Secondary | ICD-10-CM | POA: Diagnosis not present

## 2021-08-07 MED ORDER — CYCLOBENZAPRINE HCL 5 MG PO TABS: 5.0000 mg | ORAL_TABLET | Freq: Every day | ORAL | 5 refills | Status: DC | PRN

## 2021-08-07 NOTE — Progress Notes (Signed)
Chief Complaint  Patient presents with   Follow-up   F/u  1. Htn controlled on norvasc 10 m gqd not taking losartan 25 mg qd on spironolactone 50 mg qd  2. Chronic sinus thickening and left mastoid effusion left maxillary sinus pain just completed augmentin and still having left maxillary sinus pain  3. Brain fog memory loss after covid [facility]oc[facility] and rec neuropsych testing she reports misplacing items and put shampoo in the freezer  4. Neck pain left worse h/o arthritis and pt in the past never did dry needling    Review of Systems  Constitutional:  Negative for weight loss.  HENT:  Positive for sinus pain. Negative for hearing loss.   Eyes:  Negative for blurred vision.  Respiratory:  Negative for shortness of breath.   Cardiovascular:  Negative for chest pain.  Gastrointestinal:  Negative for abdominal pain and blood in stool.  Genitourinary:  Negative for dysuria.  Musculoskeletal:  Positive for neck pain. Negative for falls and joint pain.  Skin:  Negative for rash.  Neurological:  Negative for headaches.  Psychiatric/Behavioral:  Negative for depression.   Past Medical History:  Diagnosis Date   Abdominal pain, epigastric 12/17/2015   Anxiety    pt denies having dx of anxiety   BCC (basal cell carcinoma of skin)    right foot    Bunion, right foot    Chronic cough    Closed fibular fracture 10/2008   Left - Sustained 2/2 fall down stairs (same fall as tibular fracture). S/P internal fixation.   COVID-19    mild back pain, brain fog 01/2021   Duodenitis    EGD (07/14/2011) showing chronic duodenitis, H.pyloir neg   Elevated liver enzymes    Endometriosis    S/P TAH and salpingo-oophorectomy, right   Epigastric pain    Chronic, thought 2/2 gastritis, EGD (07/14/2011) showing chronic duodenitis, H.pyloir neg // Repeat EGD (09/2011) - esophagealring, sessible polyp in stomach body, hiatal hernia - rec ad probiotic, await bx   Esophageal ring    s/p  dilatation during EGD (07/2011) - Dr. 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 With chronic OA of hip   History of migraine headaches    Typical symptoms - bright lights, unilateral (starting behind her left ear), throbbing .   Hot flashes    Hypertension    Internal hemorrhoids    Osteoarthritis of hip    Ovarian cancer ([place]) 1995   Protrusio acetabuli    diagnosed at age 28   Sessile colonic polyp    noted 09/2011 -- > rec colonoscopy in 10 years, 2023   Surgical menopause 08/2007   On HRT. Occuring since 01/09 following TAH and R-salpingo-oophorectomy    Tibial fracture 10/2008   Left - Sustained 2/2 fall down stairs. Patient is S/P closed internal fixation with Smith Nephew tibial nail locked proximal and distal   Vitamin D deficiency 11/2009   Vit D level 15 in 11/2009   Past Surgical History:  Procedure Laterality Date   ABDOMINAL HYSTERECTOMY     CESAREAN SECTION  08/2005   Primary low transverse   COLONOSCOPY  09/22/2011   [id number] polyp in the rectum/Internal hemorrhoids, benign  path   ESOPHAGOGASTRODUODENOSCOPY  09/22/2011   YJE:HUDJ, esophageal/Sessile polyp in the body of the stomach/Hiatal hernia/ABDOMINAL PAIN LIKELY FUNCTIONAL V. POST-INFECTIOUS IBS, path: mild chronic gastritis   FRACTURE SURGERY     Right hip   JOINT REPLACEMENT     right hip   LAPAROSCOPY  10/2000   Operative laparoscopy with lysis of right adnexal adhesions and uterointestinal adhesions   left tib fib     OTHER SURGICAL HISTORY  02/2009   Closed treatment internal fixation, left tibia with Tamala Julian and Nephew tibial nail locked proximal and distal   TOTAL ABDOMINAL HYSTERECTOMY W/ BILATERAL SALPINGOOPHORECTOMY  08/2007   for endometriosis. With  concurrent right salpingo-oophorectomy  (2009), left oophorectomy (1995)   TUBAL LIGATION  08/2005   Right tubal ligation   Family History  Problem Relation Age of Onset   Other Father        deceased while in service.    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   Patient is married with two children, Madison and Tamera Punt (son 59 y.o as of 03/22/20 going to [employer]).    She lives in [place] but children go to school in [place]    Right handed   Caffeine: no   Social Determinants of Health   Financial Resource Strain: Not on file  Food Insecurity: Not on file  Transportation Needs: Not on file  Physical Activity: Not on file  Stress: Not on file  Social Connections: Not on file  Intimate Partner Violence: Not on file   Current Meds  Medication Sig   amLODipine (NORVASC) 10 MG tablet Take 1 tablet (10 mg total) by mouth daily.   cyclobenzaprine (FLEXERIL) 5 MG tablet Take 1 tablet (5 mg total) by mouth daily as needed for muscle spasms.   Multiple Vitamin (MULTIVITAMIN PO) Take by  mouth.   spironolactone (ALDACTONE) 50 MG tablet Take 1 tablet (50 mg total) by mouth daily. In am   VITAMIN E PO Take by mouth.   Allergies  Allergen Reactions   Pineapple Shortness Of Breath and Swelling    Has Epi pen   Eggs Or Egg-Derived Products     GI issues    Hctz [Hydrochlorothiazide]     Headaches, foggy headed  Latex Swelling   Lisinopril     Cough    Morphine And Related Itching   Tape Swelling   Recent Results (from the past 2160 hour(s))  Comprehensive metabolic panel     Status: Abnormal   Collection Time: 06/12/21 11:32 AM  Result Value Ref Range   Sodium 140 135 - 145 mEq/L   Potassium 4.2 3.5 - 5.1 mEq/L   Chloride 102 96 - 112 mEq/L   CO2 27 19 - 32 mEq/L   Glucose, Bld 67 (L) 70 - 99 mg/dL   BUN 11 6 - 23 mg/dL   Creatinine, Ser 0.97 0.40 - 1.20 mg/dL   Total Bilirubin 1.3 (H) 0.2 - 1.2 mg/dL   Alkaline Phosphatase 72 39 - 117 U/L   AST 23 0 - 37 U/L   ALT 18 0 - 35 U/L   Total Protein 7.5 6.0 - 8.3 g/dL   Albumin 4.5 3.5 - 5.2 g/dL   GFR 69.62 >60.00 mL/min    Comment: Calculated using the CKD-EPI Creatinine Equation (2021)   Calcium 10.2 8.4 - 10.5 mg/dL  Lipid panel     Status: None   Collection Time: 06/12/21 11:32 AM  Result Value Ref Range   Cholesterol 170 0 - 200 mg/dL    Comment: ATP III Classification       Desirable:  < 200 mg/dL               Borderline High:  200 - 239 mg/dL          High:  > = 240 mg/dL   Triglycerides 77.0 0.0 - 149.0 mg/dL    Comment: Normal:  <150 mg/dLBorderline High:  150 - 199 mg/dL   HDL 57.90 >39.00 mg/dL   VLDL 15.4 0.0 - 40.0 mg/dL   LDL Cholesterol 96 0 - 99 mg/dL   Total CHOL/HDL Ratio 3     Comment:                Men          Women1/2 Average Risk     3.4          3.3Average Risk          5.0          4.42X Average Risk          9.6          7.13X Average Risk          15.0          11.0                       NonHDL 111.78     Comment: NOTE:  Non-HDL goal should be 30 mg/dL higher than patient's  LDL goal (i.e. LDL goal of < 70 mg/dL, would have non-HDL goal of < 100 mg/dL)  CBC with Differential/Platelet     Status: None   Collection Time: 06/12/21 11:32 AM  Result Value Ref Range   WBC 10.3 4.0 - 10.5 K/uL   RBC 4.49 3.87 - 5.11 Mil/uL   Hemoglobin 12.2 12.0 - 15.0 g/dL   HCT 38.2 36.0 - 46.0 %   MCV 85.1 78.0 - 100.0 fl   MCHC 32.0 30.0 - 36.0 g/dL   RDW 14.4 11.5 - 15.5 %   Platelets 309.0 150.0 - 400.0 K/uL   Neutrophils Relative % 69.7 43.0 - 77.0 %   Lymphocytes Relative 21.6 12.0 - 46.0 %  Monocytes Relative 7.8 3.0 - 12.0 %   Eosinophils Relative 0.6 0.0 - 5.0 %   Basophils Relative 0.3 0.0 - 3.0 %   Neutro Abs 7.2 1.4 - 7.7 K/uL   Lymphs Abs 2.2 0.7 - 4.0 K/uL   Monocytes Absolute 0.8 0.1 - 1.0 K/uL   Eosinophils Absolute 0.1 0.0 - 0.7 K/uL   Basophils Absolute 0.0 0.0 - 0.1 K/uL  Vitamin D (25 hydroxy)     Status: None   Collection Time: 06/12/21 11:32 AM  Result Value Ref Range   VITD 33.03 30.00 - 100.00 ng/mL  Hemoglobin A1c     Status: None   Collection Time: 06/12/21 11:32 AM  Result Value Ref Range   Hgb A1c MFr Bld 6.0 4.6 - 6.5 %    Comment: Glycemic Control Guidelines for People with Diabetes:Non Diabetic:  <6%Goal of Therapy: <7%Additional Action Suggested:  >8%   HM MAMMOGRAPHY     Status: None   Collection Time: 06/17/21 12:00 AM  Result Value Ref Range   HM Mammogram 0-4 Bi-Rad 0-4 Bi-Rad, Self Reported Normal    Comment: [facility] negative   CBC with Differential/Platelet     Status: None   Collection Time: 07/25/21  9:06 AM  Result Value Ref Range   WBC 8.8 4.0 - 10.5 K/uL   RBC 4.45 3.87 - 5.11 Mil/uL   Hemoglobin 12.2 12.0 - 15.0 g/dL   HCT 37.4 36.0 - 46.0 %   MCV 84.1 78.0 - 100.0 fl   MCHC 32.5 30.0 - 36.0 g/dL   RDW 13.9 11.5 - 15.5 %   Platelets 306.0 150.0 - 400.0 K/uL   Neutrophils Relative % 68.0 43.0 - 77.0 %   Lymphocytes Relative 22.9 12.0 - 46.0 %   Monocytes Relative 7.9 3.0 - 12.0 %   Eosinophils Relative 0.9 0.0 - 5.0 %    Basophils Relative 0.3 0.0 - 3.0 %   Neutro Abs 6.0 1.4 - 7.7 K/uL   Lymphs Abs 2.0 0.7 - 4.0 K/uL   Monocytes Absolute 0.7 0.1 - 1.0 K/uL   Eosinophils Absolute 0.1 0.0 - 0.7 K/uL   Basophils Absolute 0.0 0.0 - 0.1 K/uL  COVID-19, Flu A+B and RSV     Status: None   Collection Time: 07/25/21  9:06 AM   Specimen: Nasal Swab   Nasal Swab  Previously  Result Value Ref Range   SARS-CoV-2, NAA Not Detected Not Detected   Influenza A, NAA Not Detected Not Detected   Influenza B, NAA Not Detected Not Detected   RSV, NAA Not Detected Not Detected   Test Information: Comment     Comment: This nucleic acid amplification test was developed and its performance characteristics determined by [employer]. Nucleic acid amplification tests include RT-PCR and TMA. This test has not been FDA cleared or approved. This test has been authorized by FDA under an Emergency Use Authorization (EUA). This test is only authorized for the duration of time the declaration that circumstances exist justifying the authorization of the emergency use of in vitro diagnostic tests for detection of SARS-CoV-2 virus and/or diagnosis of COVID-19 infection under section 564(b)(1) of the Act, 21 U.S.C. 937JIR-6(V) (1), unless the authorization is terminated or revoked sooner. When diagnostic testing is negative, the possibility of a false negative result should be considered in the context of a patient's recent exposures and the presence of clinical signs and symptoms consistent with COVID-19. An individual without symptoms of COVID-19 and who is not shedding SARS-CoV-2 virus  wo uld expect to have a negative (not detected) result in this assay.    Objective  Body mass index is 26.03 kg/m. Wt Readings from Last 3 Encounters:  08/07/21 192 lb (87.1 kg)  07/25/21 192 lb 6.4 oz (87.3 kg)  07/08/21 183 lb 9.6 oz (83.3 kg)   Temp Readings from Last 3 Encounters:  08/07/21 (!) 97.3 F (36.3 C) (Temporal)   07/08/21 98.5 F (36.9 C) (Oral)  06/12/21 98.2 F (36.8 C) (Oral)   BP Readings from Last 3 Encounters:  08/07/21 122/70  07/25/21 134/82  07/08/21 112/76   Pulse Readings from Last 3 Encounters:  08/07/21 61  07/25/21 (!) 58  07/08/21 89    Physical Exam Vitals and nursing note reviewed.  Constitutional:      Appearance: Normal appearance. She is well-developed and well-groomed.  HENT:     Head: Normocephalic and atraumatic.  Eyes:     Conjunctiva/sclera: Conjunctivae normal.     Pupils: Pupils are equal, round, and reactive to light.  Cardiovascular:     Rate and Rhythm: Normal rate and regular rhythm.     Heart sounds: Normal heart sounds. No murmur heard. Pulmonary:     Effort: Pulmonary effort is normal.     Breath sounds: Normal breath sounds.  Abdominal:     General: Abdomen is flat. Bowel sounds are normal.     Tenderness: There is no abdominal tenderness.  Musculoskeletal:        General: No tenderness.  Skin:    General: Skin is warm and dry.  Neurological:     General: No focal deficit present.     Mental Status: She is alert and oriented to person, place, and time. Mental status is at baseline.     Cranial Nerves: Cranial nerves 2-12 are intact.     Gait: Gait is intact.  Psychiatric:        Attention and Perception: Attention and perception normal.        Mood and Affect: Mood and affect normal.        Speech: Speech normal.        Behavior: Behavior normal. Behavior is cooperative.        Thought Content: Thought content normal.        Cognition and Memory: Cognition and memory normal.        Judgment: Judgment normal.    Assessment  Plan  Pain of left mastoid - Plan: Ambulatory referral to ENT Sinus mucosal thickening - Plan: Ambulatory referral to ENT Chronic maxillary sinusitis - Plan: Ambulatory referral to ENT  Long COVID - Plan: Ambulatory referral to Neuropsychology Memory loss - Plan: Ambulatory referral to Neuropsychology [facility] Dr.  [facility] also for migraines   Cervical arthritis - Plan: cyclobenzaprine (FLEXERIL) 5 MG tablet Muscle spasm - Plan: cyclobenzaprine (FLEXERIL) 5 MG tablet  Consider PT and dry needling in the future   [facility] Flu shot will get in future egg free  Hep B immune, consider hep A vaccine h/o fatty liver  Tdap up to date 06/05/16 pna 23 had 02/19/09 HCV neg 06/05/16  HIV neg 06/05/16 covid 3/3 sch 06/15/21 4th dose had covid 01/2021    mammo 06/20/18 neg referred again [facility] with dexa  -sch 11/2021had mammo and dexa Sch 06/17/21      Get copy of pap [facility] [place] saw 2019 s/p hysterectomy for endometriosis in 2007 prior LSO in 1995 for ovarian issues per OB/GYN notes 05/2018 [facility] -no rec paps  per OB/GYN no h/o abnormals per note 05/14/18 ob/gyn     Colonoscopy having done [facility] unable to due did CT colonoscopy and utd no issues chronic IBS-C    Rec healthy diet and exercise  Provider: Dr. Olivia Mackie McLean-Scocuzza-Internal Medicine

## 2021-08-07 NOTE — Patient Instructions (Addendum)
Consider dry needling for neck pain in the future    Long Covid clinics in [place] [facility]) Internal Medicine Post-Acute [facility] [address] [place], [place] - Physician referral required [email or web address] - [phone]  [facility] Post-COVID Recovery Program [address], [address] [place], [place] - [facility] for [place] [phone] [place], [place] - [place] [facility] [[address], [place], [place] - Enhanced External Counterpulsation (EECP) treatment primarily for cardiovascular effects of long covid but also shown improvements in breath, fatigue, and brain fog - [phone]  [facility], [place], [place], [place] COVID-19 Recovery Program [address] [facility] [place], [place] - Referral needed - EPIC or fax to [phone]

## 2021-08-08 ENCOUNTER — Telehealth: Payer: Self-pay | Admitting: Internal Medicine

## 2021-08-08 NOTE — Telephone Encounter (Signed)
The clinic we referred her to in [place] for covid follow up only sees kids of the other locations given to her on check out sheet where does she want to go? Please sent referral there  Merlene Morse  McLean-Scocuzza, Nino Glow, MD We cannot see your 45-yo patient because we only see children and adolescents at our clinic.

## 2021-08-09 NOTE — Telephone Encounter (Signed)
Are you wanting patient o see pulmonology for FU from Covid we do not have post covid clinics any longer.

## 2021-08-14 NOTE — Telephone Encounter (Signed)
[name] can you call the patient about prior phone note?

## 2021-08-14 NOTE — Telephone Encounter (Signed)
Covid 19 neuropsych testing in [place] is only for kids based on list given in AVS does pt want referral anywhere else otherwise can contact neurology [facility] Dr. Milas Kocher to see what she recommends  [facility]  Thank you

## 2021-08-15 ENCOUNTER — Other Ambulatory Visit: Payer: Self-pay

## 2021-08-15 ENCOUNTER — Ambulatory Visit
Admission: RE | Admit: 2021-08-15 | Discharge: 2021-08-15 | Disposition: A | Discharge status: Home / Self Care | Payer: Medicare Other | Source: Ambulatory Visit | Attending: Internal Medicine | Admitting: Internal Medicine

## 2021-08-15 VITALS — BP 138/90 | HR 58 | Temp 97.9°F | Resp 16

## 2021-08-15 DIAGNOSIS — J0101 Acute recurrent maxillary sinusitis: Secondary | ICD-10-CM | POA: Diagnosis not present

## 2021-08-15 MED ORDER — CEFDINIR 300 MG PO CAPS: 300.0000 mg | ORAL_CAPSULE | Freq: Two times a day (BID) | ORAL | 0 refills | Status: DC

## 2021-08-15 NOTE — ED Triage Notes (Signed)
Patient presents to Urgent Care with complaints of nasal congestion x 4 weeks. She states she was treated for a sinus infection 12/28 and still continues to have nasal congestion and facial pressure. Treating symptoms with allegra D, nasal spray, and ibuprofen.   Denies fever.

## 2021-08-15 NOTE — Discharge Instructions (Signed)
Do saline nose rinses to help with sinus pressure and clear the thick mucous  Do not use tap water, only boiled water that has been cooled down.  Do it twice a day  for 5-7 days, but avoid bed time.

## 2021-08-15 NOTE — ED Provider Notes (Signed)
[facility]    CSN: [id number] Arrival date & time: 08/15/21  0815      History   Chief Complaint Chief Complaint  Patient presents with   Appointment    0900   Nasal Congestion    HPI [name] is a 46 y.o. female who presents with unresolved nose congestion, post nasal drainage with yellow color with some  blood and sinus pressure since treated for sinusitis on 12/15 at which time she was prescribed Augmentin and prednisone. She was seen again on 12/28 with L maxillary sinus pain and was referred to ENT,but cant see them til 1/28 and the past 2 nights she cant sleep due to the face pain.     Past Medical History:  Diagnosis Date   Abdominal pain, epigastric 12/17/2015   Anxiety    pt denies having dx of anxiety   BCC (basal cell carcinoma of skin)    right foot    Bunion, right foot    Chronic cough    Closed fibular fracture 10/2008   Left - Sustained 2/2 fall down stairs (same fall as tibular fracture). S/P internal fixation.   COVID-19    mild back pain, brain fog 01/2021   Duodenitis    EGD (07/14/2011) showing chronic duodenitis, H.pyloir neg   Elevated liver enzymes    Endometriosis    S/P TAH and salpingo-oophorectomy, right   Epigastric pain    Chronic, thought 2/2 gastritis, EGD (07/14/2011) showing chronic duodenitis, H.pyloir neg // Repeat EGD (09/2011) - esophagealring, sessible polyp in stomach body, hiatal hernia - rec ad probiotic, await bx   Esophageal ring    s/p dilatation during EGD (07/2011) - Dr. 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 With chronic OA of hip   History of migraine headaches    Typical symptoms - bright lights, unilateral (starting behind her left ear),  throbbing .   Hot flashes    Hypertension    Internal hemorrhoids    Osteoarthritis of hip    Ovarian cancer ([place]) 1995   Protrusio acetabuli    diagnosed at age 52   Sessile colonic polyp    noted 09/2011 -- > rec colonoscopy in 10 years, 2023   Surgical menopause 08/2007   On HRT. Occuring since 01/09 following TAH and R-salpingo-oophorectomy    Tibial fracture 10/2008   Left - Sustained 2/2 fall down stairs. Patient is S/P closed internal fixation with Smith Nephew tibial nail locked proximal and distal   Vitamin D deficiency 11/2009   Vit D level 15 in 11/2009    Patient Active Problem List   Diagnosis Date Noted   Pain of left mastoid 08/07/2021   Sinus mucosal thickening 08/07/2021   Cervical arthritis 08/07/2021   Mass of right thigh 07/08/2021   Chronic neck pain 03/22/2020   Prediabetes 12/23/2019   Melasma 12/21/2019   Irritable bowel syndrome with constipation 12/21/2019   Concussion with no loss of consciousness 08/12/2018   Acute pain of left shoulder 08/12/2018   Low back pain 08/12/2018   Allergic rhinitis 08/05/2018   Arthritis 07/01/2018   DDD (degenerative disc disease), lumbar 05/21/2018   Thoracic spondylosis 05/21/2018   DDD (degenerative disc disease), cervical  01/06/2018   Hidradenitis suppurativa 10/11/2017   Chronic frontal sinusitis 10/11/2017   Fatty liver 10/11/2017   Chronic ethmoidal sinusitis 09/13/2017   Photophobia of both eyes 09/13/2017   Abnormal liver enzymes 09/13/2017   History of hysterectomy with bilateral oophorectomy 06/24/2017   Estrogen deficiency 06/24/2017   Overweight (BMI 25.0-29.9) 11/17/2016   Abdominal pain 11/04/2016   Headache 12/17/2015   Chronic constipation 10/12/2012   Hot flashes 02/20/2012   Hiatal hernia 02/20/2012   Chronic pain of both hips 02/20/2012   Fatigue 12/19/2011   Anemia 06/27/2011   Acetabulum intrapelvic protrusion into pelvic region or thigh 05/15/2011   Annual physical exam 10/03/2010    Hypertension 03/20/2010   Vitamin D deficiency 08/20/2009   Anxiety 05/18/2009   Migraine headache 05/13/2006    Past Surgical History:  Procedure Laterality Date   ABDOMINAL HYSTERECTOMY     CESAREAN SECTION  08/2005   Primary low transverse   COLONOSCOPY  09/22/2011   [id number] polyp in the rectum/Internal hemorrhoids, benign path   ESOPHAGOGASTRODUODENOSCOPY  09/22/2011   [id number], esophageal/Sessile polyp in the body of the stomach/Hiatal hernia/ABDOMINAL PAIN LIKELY FUNCTIONAL V. POST-INFECTIOUS IBS, path: mild chronic gastritis   FRACTURE SURGERY     Right hip   JOINT REPLACEMENT     right hip   LAPAROSCOPY  10/2000   Operative laparoscopy with lysis of right adnexal adhesions and uterointestinal adhesions   left tib fib     OTHER SURGICAL HISTORY  02/2009   Closed treatment internal fixation, left tibia with Tamala Julian and Nephew tibial nail locked proximal and distal   TOTAL ABDOMINAL HYSTERECTOMY W/ BILATERAL SALPINGOOPHORECTOMY  08/2007   for endometriosis. With concurrent right salpingo-oophorectomy  (2009), left oophorectomy (1995)   TUBAL LIGATION  08/2005   Right tubal ligation    OB History   No obstetric history on file.      Home Medications    Prior to Admission medications   Medication Sig Start Date End Date Taking? Authorizing Provider  amLODipine (NORVASC) 10 MG tablet Take 1 tablet (10 mg total) by mouth daily. 06/12/21   McLean-Scocuzza, Nino Glow, MD  cyclobenzaprine (FLEXERIL) 5 MG tablet Take 1 tablet (5 mg total) by mouth daily as needed for muscle spasms. 08/07/21   McLean-Scocuzza, Nino Glow, MD  hydroquinone 4 % cream Apply topically at bedtime. qhs x 3-4 months then 2x per week as needed Patient not taking: Reported on 08/07/2021 12/21/19   McLean-Scocuzza, Nino Glow, MD  Multiple Vitamin (MULTIVITAMIN PO) Take by mouth.    [provider]  spironolactone (ALDACTONE) 50 MG tablet Take 1 tablet (50 mg total) by mouth daily. In am 06/12/21    McLean-Scocuzza, Nino Glow, MD  VITAMIN E PO Take by mouth.    [provider]    Family History Family History  Problem Relation Age of Onset   Other Father        deceased while in service.    Breast cancer Sister 40       remission   Cancer Sister        breast   Lupus Cousin    Other Mother        unknown medical history   Dementia Maternal Grandmother        at old age   Parkinson's disease Maternal Grandfather    Multiple sclerosis Other        uncle    Colon cancer Neg Hx    Liver disease Neg Hx  Migraines Neg Hx     Social History Social History   Tobacco Use   Smoking status: Never   Smokeless tobacco: Never  Vaping Use   Vaping Use: Never used  Substance Use Topics   Alcohol use: No    Alcohol/week: 0.0 standard drinks   Drug use: No     Allergies   Pineapple, Eggs or egg-derived products, Hctz [hydrochlorothiazide], Latex, Lisinopril, Morphine and related, and Tape   Review of Systems Review of Systems  Constitutional:  Positive for chills, diaphoresis and fatigue. Negative for appetite change and fever.  HENT:  Positive for congestion, postnasal drip, sinus pressure and sinus pain. Negative for ear discharge, ear pain, rhinorrhea and sore throat.   Eyes:  Negative for discharge.  Respiratory:  Negative for cough.   Skin:  Negative for rash.    Physical Exam Triage Vital Signs ED Triage Vitals [08/15/21 0828]  Enc Vitals Group     BP 138/90     Pulse Rate (!) 58     Resp 16     Temp 97.9 F (36.6 C)     Temp Source Oral     SpO2 100 %     Weight      Height      Head Circumference      Peak Flow      Pain Score 8     Pain Loc      Pain Edu?      Excl. in [place]?    No data found.  Updated Vital Signs BP 138/90 (BP Location: Left Arm)    Pulse (!) 58    Temp 97.9 F (36.6 C) (Oral)    Resp 16    SpO2 100%   Visual Acuity Right Eye Distance:   Left Eye Distance:   Bilateral Distance:    Right Eye Near:   Left Eye  Near:    Bilateral Near:     Physical Exam Physical Exam Vitals signs and nursing note reviewed.  Constitutional:      General: She is not in acute distress.    Appearance: Normal appearance. She is not ill-appearing, toxic-appearing or diaphoretic.  HENT:     Head: Normocephalic.     Right Ear: Tympanic membrane, ear canal and external ear normal.     Left Ear: Tympanic membrane, ear canal and external ear normal.     Nose: Nose normal. Has tenderness on L maxillary, ethmoid and frontal. L maxillary sinus has decreased light translumination.      Mouth/Throat: clear    Mouth: Mucous membranes are moist.  Eyes:     General: No scleral icterus.       Right eye: No discharge.        Left eye: No discharge.     Conjunctiva/sclera: Conjunctivae normal.  Neck:     Musculoskeletal: Neck supple. No neck rigidity.  Cardiovascular:     Rate and Rhythm: Normal rate and regular rhythm.     Heart sounds: No murmur.  Pulmonary:     Effort: Pulmonary effort is normal.     Breath sounds: Normal breath sounds.  Musculoskeletal: Normal range of motion.  Lymphadenopathy:     Cervical: No cervical adenopathy.  Skin:    General: Skin is warm and dry.     Coloration: Skin is not jaundiced.     Findings: No rash.  Neurological:     Mental Status: She is alert and oriented to person, place, and time.  Gait: Gait normal.  Psychiatric:        Mood and Affect: Mood normal.        Behavior: Behavior normal.        Thought Content: Thought content normal.        Judgment: Judgment normal.    UC Treatments / Results  Labs (all labs ordered are listed, but only abnormal results are displayed) Labs Reviewed - No data to display  EKG   Radiology No results found.  Procedures Procedures (including critical care time)  Medications Ordered in UC Medications - No data to display  Initial Impression / Assessment and Plan / UC Course  I have reviewed the triage vital signs and the  nursing notes. I placed her on Cefdinir as noted and showed her a video how to do saline nose rinses since she had never done this. See instructions.  Final Clinical Impressions(s) / UC Diagnoses   Final diagnoses:  None   Discharge Instructions   None    ED Prescriptions   None    PDMP not reviewed this encounter.   Shelby Mattocks, [place] 08/15/21 [phone]

## 2021-08-20 NOTE — Telephone Encounter (Signed)
Left message to return call.  The referral placed for COVID follow up in [place] only sees kids. Would she like a referral to [facility]?

## 2021-08-20 NOTE — Telephone Encounter (Signed)
Left message to return call 

## 2021-08-28 ENCOUNTER — Ambulatory Visit
Admission: EM | Admit: 2021-08-28 | Discharge: 2021-08-28 | Disposition: A | Discharge status: Home / Self Care | Payer: Medicare Other | Attending: Family Medicine | Admitting: Family Medicine

## 2021-08-28 ENCOUNTER — Other Ambulatory Visit: Payer: Self-pay

## 2021-08-28 DIAGNOSIS — B372 Candidiasis of skin and nail: Secondary | ICD-10-CM | POA: Diagnosis not present

## 2021-08-28 DIAGNOSIS — M7542 Impingement syndrome of left shoulder: Secondary | ICD-10-CM | POA: Diagnosis not present

## 2021-08-28 DIAGNOSIS — M25812 Other specified joint disorders, left shoulder: Secondary | ICD-10-CM

## 2021-08-28 MED ORDER — FLUCONAZOLE 150 MG PO TABS: 150.0000 mg | ORAL_TABLET | Freq: Every day | ORAL | 0 refills | Status: DC

## 2021-08-28 MED ORDER — NAPROXEN 500 MG PO TABS: 500.0000 mg | ORAL_TABLET | Freq: Two times a day (BID) | ORAL | 0 refills | Status: DC | PRN

## 2021-08-28 MED ORDER — NYSTATIN 100000 UNIT/GM EX CREA: TOPICAL_CREAM | CUTANEOUS | 0 refills | Status: DC

## 2021-08-28 NOTE — ED Provider Notes (Signed)
[facility]    CSN: [id number] Arrival date & time: 08/28/21  0936      History   Chief Complaint Chief Complaint  Patient presents with   arm numbness    Left arm numbness and shoulder pain    HPI [name] is a 46 y.o. female.   Presenting today with left shoulder and upper back soreness, spasms and sharp pains shooting down the left arm with some numbness to her fingertips, particularly when she lays on the side.  She denies known injury, recent heavy lifting, neck pain, arm weakness, headaches, dizziness, recent head injury, speech or swallowing issues.  She took some ibuprofen yesterday with minimal relief.  She also states she has had a new rash come up the last day or 2 in the creases of her elbows, groin folds, lower abdomen, below her breast.  Somewhat itchy but not painful.  States she is coming off of 2 weeks of antibiotics for a sinus infection and usually gets yeast infections with this.  Has not tried anything for the rash thus far.  Past Medical History:  Diagnosis Date   Abdominal pain, epigastric 12/17/2015   Anxiety    pt denies having dx of anxiety   BCC (basal cell carcinoma of skin)    right foot    Bunion, right foot    Chronic cough    Closed fibular fracture 10/2008   Left - Sustained 2/2 fall down stairs (same fall as tibular fracture). S/P internal fixation.   COVID-19    mild back pain, brain fog 01/2021   Duodenitis    EGD (07/14/2011) showing chronic duodenitis, H.pyloir neg   Elevated liver enzymes    Endometriosis    S/P TAH and salpingo-oophorectomy, right   Epigastric pain    Chronic, thought 2/2 gastritis, EGD (07/14/2011) showing chronic duodenitis, H.pyloir neg // Repeat EGD (09/2011) - esophagealring, sessible polyp in stomach body, hiatal hernia - rec ad probiotic, await bx   Esophageal ring    s/p dilatation during EGD (07/2011) - Dr. 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 With chronic OA of hip   History of migraine headaches    Typical symptoms - bright lights, unilateral (starting behind her left ear), throbbing .   Hot flashes    Hypertension    Internal hemorrhoids    Osteoarthritis of hip    Ovarian cancer ([place]) 1995   Protrusio acetabuli    diagnosed at age 64   Sessile colonic polyp    noted 09/2011 -- > rec colonoscopy in 10 years, 2023   Surgical menopause 08/2007   On HRT. Occuring since 01/09 following TAH and R-salpingo-oophorectomy    Tibial fracture 10/2008   Left - Sustained 2/2 fall down stairs. Patient is S/P closed internal fixation with Smith Nephew tibial nail locked proximal and distal   Vitamin D deficiency 11/2009   Vit D level 15 in 11/2009    Patient Active Problem List   Diagnosis Date Noted   Pain of left mastoid 08/07/2021   Sinus mucosal thickening 08/07/2021   Cervical arthritis 08/07/2021   Mass of right thigh 07/08/2021   Chronic neck pain 03/22/2020   Prediabetes 12/23/2019  Melasma 12/21/2019   Irritable bowel syndrome with constipation 12/21/2019   Concussion with no loss of consciousness 08/12/2018   Acute pain of left shoulder 08/12/2018   Low back pain 08/12/2018   Allergic rhinitis 08/05/2018   Arthritis 07/01/2018   DDD (degenerative disc disease), lumbar 05/21/2018   Thoracic spondylosis 05/21/2018   DDD (degenerative disc disease), cervical 01/06/2018   Hidradenitis suppurativa 10/11/2017   Chronic frontal sinusitis 10/11/2017   Fatty liver 10/11/2017   Chronic ethmoidal sinusitis 09/13/2017   Photophobia of both eyes 09/13/2017   Abnormal liver enzymes 09/13/2017   History of hysterectomy with bilateral oophorectomy 06/24/2017   Estrogen deficiency 06/24/2017   Overweight (BMI  25.0-29.9) 11/17/2016   Abdominal pain 11/04/2016   Headache 12/17/2015   Chronic constipation 10/12/2012   Hot flashes 02/20/2012   Hiatal hernia 02/20/2012   Chronic pain of both hips 02/20/2012   Fatigue 12/19/2011   Anemia 06/27/2011   Acetabulum intrapelvic protrusion into pelvic region or thigh 05/15/2011   Annual physical exam 10/03/2010   Hypertension 03/20/2010   Vitamin D deficiency 08/20/2009   Anxiety 05/18/2009   Migraine headache 05/13/2006    Past Surgical History:  Procedure Laterality Date   ABDOMINAL HYSTERECTOMY     CESAREAN SECTION  08/2005   Primary low transverse   COLONOSCOPY  09/22/2011   [id number] polyp in the rectum/Internal hemorrhoids, benign path   ESOPHAGOGASTRODUODENOSCOPY  09/22/2011   [facility]:NTIR, esophageal/Sessile polyp in the body of the stomach/Hiatal hernia/ABDOMINAL PAIN LIKELY FUNCTIONAL V. POST-INFECTIOUS IBS, path: mild chronic gastritis   FRACTURE SURGERY     Right hip   JOINT REPLACEMENT     right hip   LAPAROSCOPY  10/2000   Operative laparoscopy with lysis of right adnexal adhesions and uterointestinal adhesions   left tib fib     OTHER SURGICAL HISTORY  02/2009   Closed treatment internal fixation, left tibia with Tamala Julian and Nephew tibial nail locked proximal and distal   TOTAL ABDOMINAL HYSTERECTOMY W/ BILATERAL SALPINGOOPHORECTOMY  08/2007   for endometriosis. With concurrent right salpingo-oophorectomy  (2009), left oophorectomy (1995)   TUBAL LIGATION  08/2005   Right tubal ligation    OB History   No obstetric history on file.      Home Medications    Prior to Admission medications   Medication Sig Start Date End Date Taking? Authorizing Provider  fluconazole (DIFLUCAN) 150 MG tablet Take 1 tablet (150 mg total) by mouth daily. 08/28/21  Yes Volney American, PA-C  naproxen (NAPROSYN) 500 MG tablet Take 1 tablet (500 mg total) by mouth 2 (two) times daily as needed. 08/28/21  Yes Volney American, PA-C   nystatin cream (MYCOSTATIN) Apply to affected area 2 times daily 08/28/21  Yes Volney American, PA-C  amLODipine (NORVASC) 10 MG tablet Take 1 tablet (10 mg total) by mouth daily. 06/12/21   McLean-Scocuzza, Nino Glow, MD  cefdinir (OMNICEF) 300 MG capsule Take 1 capsule (300 mg total) by mouth 2 (two) times daily. 08/15/21   Rodriguez-Southworth, Sunday Spillers, PA-C  cyclobenzaprine (FLEXERIL) 5 MG tablet Take 1 tablet (5 mg total) by mouth daily as needed for muscle spasms. 08/07/21   McLean-Scocuzza, Nino Glow, MD  hydroquinone 4 % cream Apply topically at bedtime. qhs x 3-4 months then 2x per week as needed Patient not taking: Reported on 08/07/2021 12/21/19   McLean-Scocuzza, Nino Glow, MD  Multiple Vitamin (MULTIVITAMIN PO) Take by mouth.    [provider]  spironolactone (ALDACTONE) 50 MG tablet Take 1  tablet (50 mg total) by mouth daily. In am 06/12/21   McLean-Scocuzza, Nino Glow, MD  VITAMIN E PO Take by mouth.    [provider]    Family History Family History  Problem Relation Age of Onset   Other Father        deceased while in service.    Breast cancer Sister 3       remission   Cancer Sister        breast   Lupus Cousin    Other Mother        unknown medical history   Dementia Maternal Grandmother        at old age   Parkinson's disease Maternal Grandfather    Multiple sclerosis Other        uncle    Colon cancer Neg Hx    Liver disease Neg Hx    Migraines Neg Hx     Social History Social History   Tobacco Use   Smoking status: Never   Smokeless tobacco: Never  Vaping Use   Vaping Use: Never used  Substance Use Topics   Alcohol use: No    Alcohol/week: 0.0 standard drinks   Drug use: No     Allergies   Pineapple, Eggs or egg-derived products, Hctz [hydrochlorothiazide], Latex, Lisinopril, Morphine and related, and Tape   Review of Systems Review of Systems Per HPI  Physical Exam Triage Vital Signs ED Triage Vitals  Enc Vitals Group      BP 08/28/21 1022 122/83     Pulse Rate 08/28/21 1022 63     Resp 08/28/21 1022 16     Temp 08/28/21 1022 98.3 F (36.8 C)     Temp Source 08/28/21 1022 Oral     SpO2 08/28/21 1022 99 %     Weight --      Height --      Head Circumference --      Peak Flow --      Pain Score 08/28/21 1018 9     Pain Loc --      Pain Edu? --      Excl. in [place]? --    No data found.  Updated Vital Signs BP 122/83 (BP Location: Right Arm)    Pulse 63    Temp 98.3 F (36.8 C) (Oral)    Resp 16    SpO2 99%   Visual Acuity Right Eye Distance:   Left Eye Distance:   Bilateral Distance:    Right Eye Near:   Left Eye Near:    Bilateral Near:     Physical Exam Vitals and nursing note reviewed.  Constitutional:      Appearance: Normal appearance. She is not ill-appearing.  HENT:     Head: Atraumatic.     Mouth/Throat:     Mouth: Mucous membranes are moist.  Eyes:     Extraocular Movements: Extraocular movements intact.     Conjunctiva/sclera: Conjunctivae normal.     Pupils: Pupils are equal, round, and reactive to light.  Cardiovascular:     Rate and Rhythm: Normal rate and regular rhythm.     Heart sounds: Normal heart sounds.  Pulmonary:     Effort: Pulmonary effort is normal.     Breath sounds: Normal breath sounds. No wheezing or rales.  Musculoskeletal:        General: Tenderness present. No swelling, deformity or signs of injury. Normal range of motion.     Cervical back: Normal range of  motion and neck supple.     Comments: Passive and active range of motion full and intact bilateral upper extremities.  Strength full and equal bilateral hands.  Tenderness to palpation left subscapular region and trapezius  Skin:    General: Skin is warm and dry.     Findings: Rash present.     Comments: Erythematous maculopapular rash groin folds, under breasts, antecubital creases and suprapubic region bilaterally  Neurological:     General: No focal deficit present.     Mental Status: She is  alert and oriented to person, place, and time.     Cranial Nerves: No cranial nerve deficit.     Motor: No weakness.     Gait: Gait normal.     Comments: Bilateral upper extremities neurovascularly intact  Psychiatric:        Mood and Affect: Mood normal.        Thought Content: Thought content normal.        Judgment: Judgment normal.     UC Treatments / Results  Labs (all labs ordered are listed, but only abnormal results are displayed) Labs Reviewed - No data to display  EKG   Radiology No results found.  Procedures Procedures (including critical care time)  Medications Ordered in UC Medications - No data to display  Initial Impression / Assessment and Plan / UC Course  I have reviewed the triage vital signs and the nursing notes.  Pertinent labs & imaging results that were available during my care of the patient were reviewed by me and considered in my medical decision making (see chart for details).      Suspect shoulder impingement for muscular spasms causing her left arm radicular pain.  Has a prescription for Flexeril from PCP waiting at pharmacy, discussed to start this medication in addition to naproxen, stretches, heat.  We will treat rash for suspected yeast infection with nystatin, Diflucan tablets.  Close PCP follow-up recommended by next week.  Return at anytime for acutely worsening symptoms.  Work note given.  Final Clinical Impressions(s) / UC Diagnoses   Final diagnoses:  Shoulder impingement, left  Cutaneous candidiasis   Discharge Instructions   None    ED Prescriptions     Medication Sig Dispense Auth. Provider   fluconazole (DIFLUCAN) 150 MG tablet Take 1 tablet (150 mg total) by mouth daily. 7 tablet Volney American, [place]   nystatin cream (MYCOSTATIN) Apply to affected area 2 times daily 80 g Volney American, PA-C   naproxen (NAPROSYN) 500 MG tablet Take 1 tablet (500 mg total) by mouth 2 (two) times daily as needed. 30  tablet Volney American, [place]      PDMP not reviewed this encounter.   Volney American, [place] 08/28/21 1133

## 2021-08-28 NOTE — ED Triage Notes (Signed)
Patient states her left arm hurt like a bad cramp is going through her shoulder  Patient states her arm is going numb to her fingertips when she lays on it  Patient states she took Ibuprofen for pain. Last dose was yesterday.    Patient states that she has a new rash coming up on her abdomin, the folds of her arms and her left underarm since Saturday   Patient does not know if the arm pain and rash are connected in any way.

## 2021-09-12 NOTE — Telephone Encounter (Signed)
I called and LVM for the patient to call back about a referral.  Keymiah Lyles,cma

## 2021-09-16 DIAGNOSIS — R519 Headache, unspecified: Secondary | ICD-10-CM | POA: Diagnosis not present

## 2021-09-16 DIAGNOSIS — J329 Chronic sinusitis, unspecified: Secondary | ICD-10-CM | POA: Diagnosis not present

## 2021-09-19 ENCOUNTER — Encounter: Payer: Self-pay | Admitting: Internal Medicine

## 2021-09-19 ENCOUNTER — Encounter: Payer: Self-pay | Admitting: *Deleted

## 2021-09-19 DIAGNOSIS — D1723 Benign lipomatous neoplasm of skin and subcutaneous tissue of right leg: Secondary | ICD-10-CM | POA: Diagnosis not present

## 2021-09-19 IMAGING — CT CT MAXILLOFACIAL W/ CM
3 series · 14 of 47 positions shown, 16 images · IV contrast (APPLIED)
Comparison: CT of the maxillofacial 09/18/2017.

CLINICAL DATA: Right mandible and dental pain. Wisdom teeth remain
2 weeks ago. Unable open jaw or talk. Symptoms occurred suddenly
after hearing a pop while eating.

EXAM:
CT MAXILLOFACIAL WITH CONTRAST
TECHNIQUE: Multidetector CT imaging of the maxillofacial structures was
performed with intravenous contrast. Multiplanar CT image
reconstructions were also generated.
CONTRAST:  75mL OMNIPAQUE IOHEXOL 300 MG/ML  SOLN

[Series 3: facial/orbits w 2.0 st · axial · 0.40mm/px · z∈[+1200,+1374]mm · 8 of 101 slices shown, 10 images]
[im 7/101  brain]
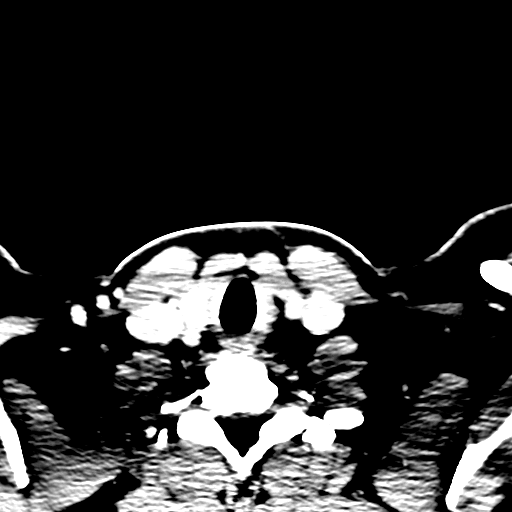
[im 7/101  bone]
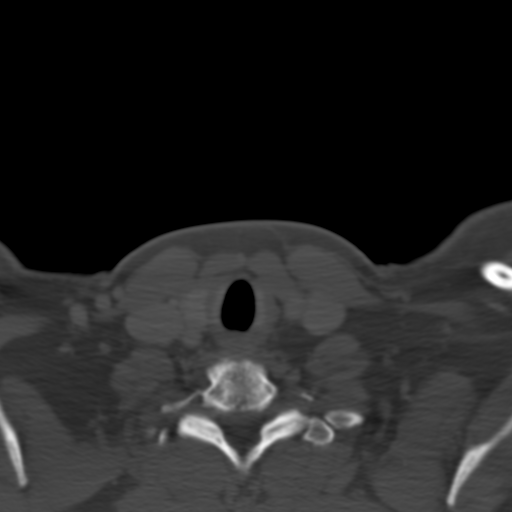
[im 21/101  bone]
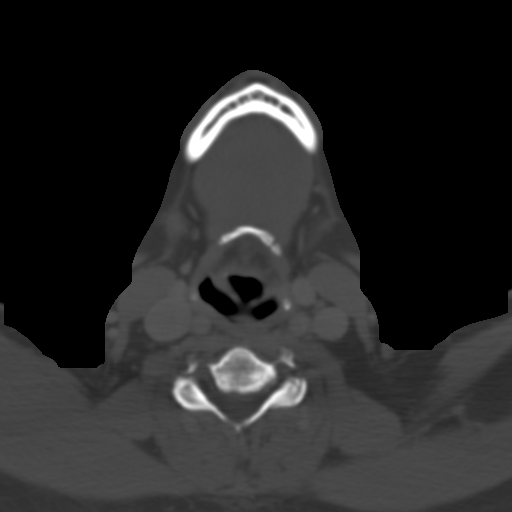
[im 32/101  bone]
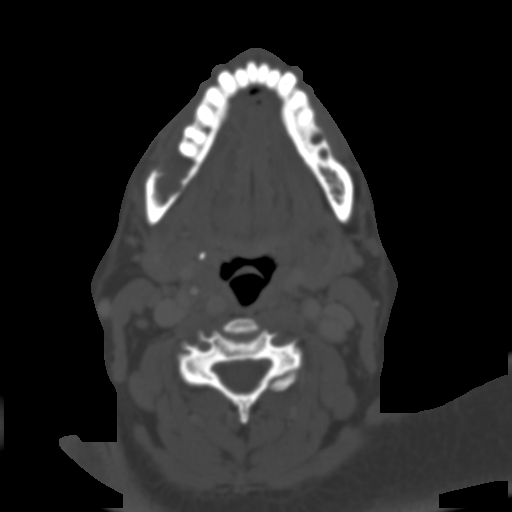
[im 45/101  bone]
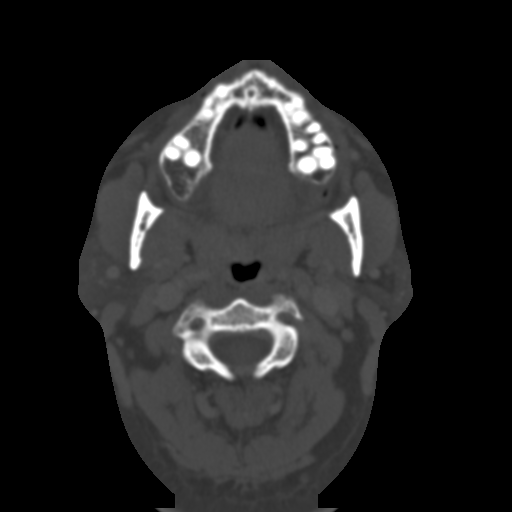
[im 56/101  brain]
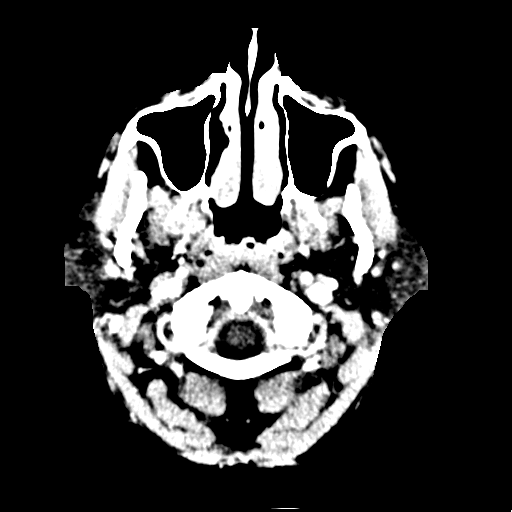
[im 56/101  bone]
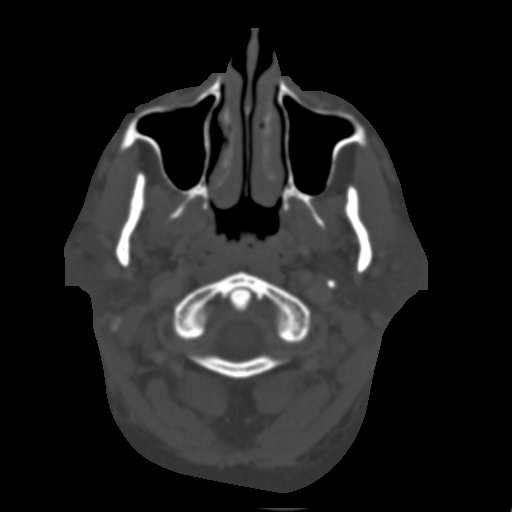
[im 69/101  bone]
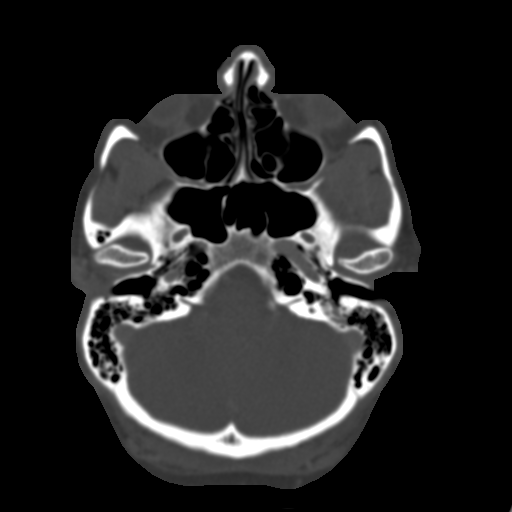
[im 80/101  bone]
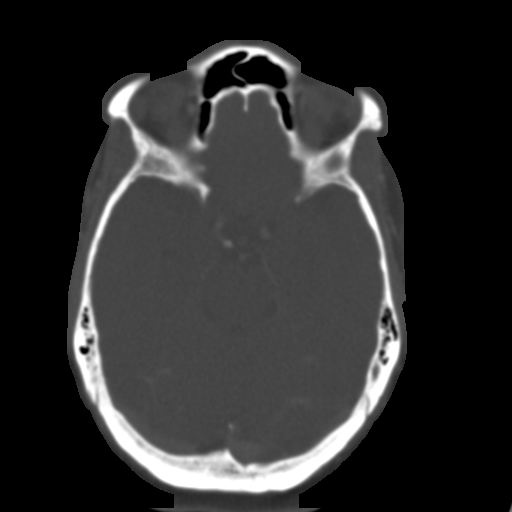
[im 94/101  bone]
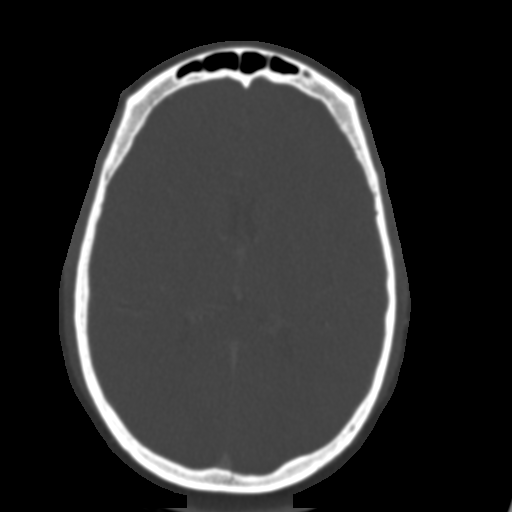

[Series 7: coronal soft tissue · coronal · 0.39mm/px · 3 of 113 slices shown]
[im 38/113  bone]
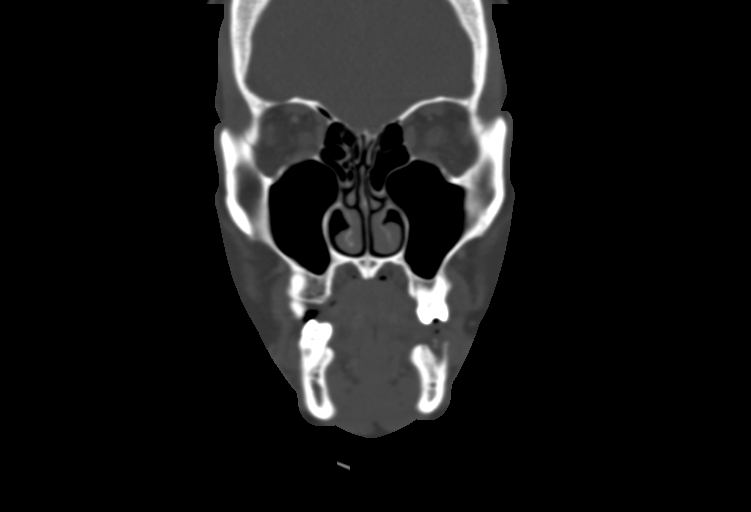
[im 50/113  bone]
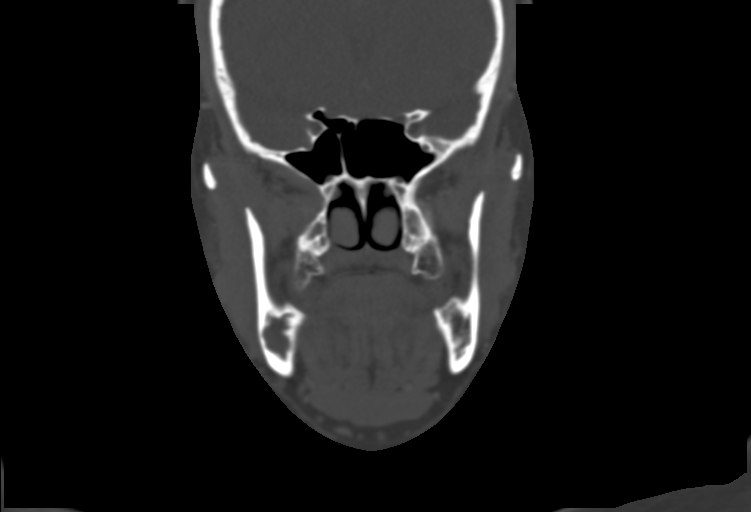
[im 63/113  bone]
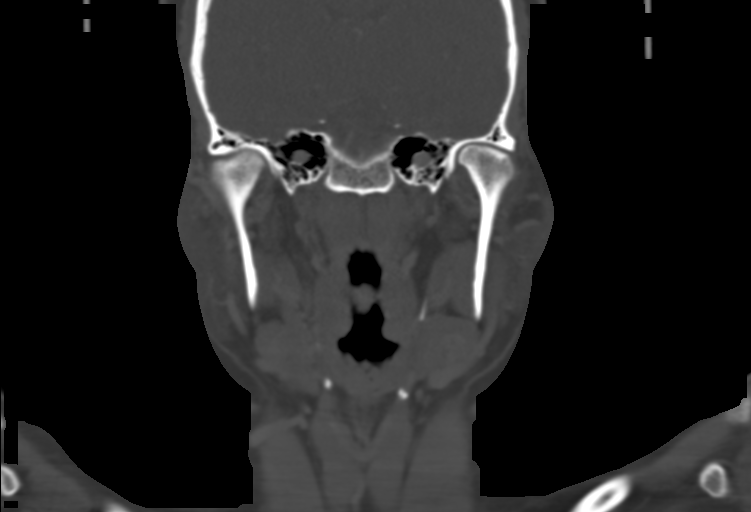

[Series 8: sagittal soft tissue · sagittal · 0.48mm/px · 3 of 91 slices shown]
[im 31/91  bone]
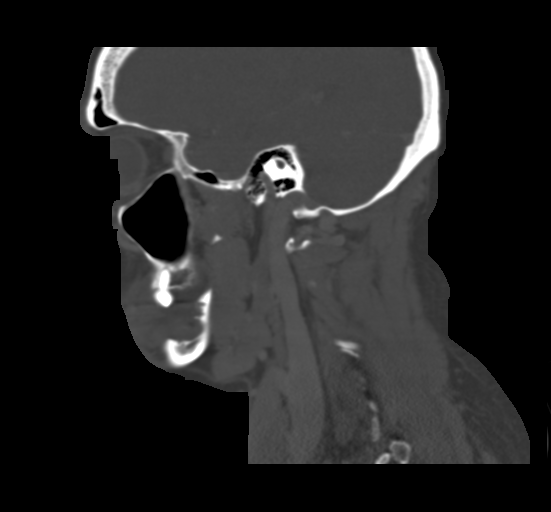
[im 46/91  bone]
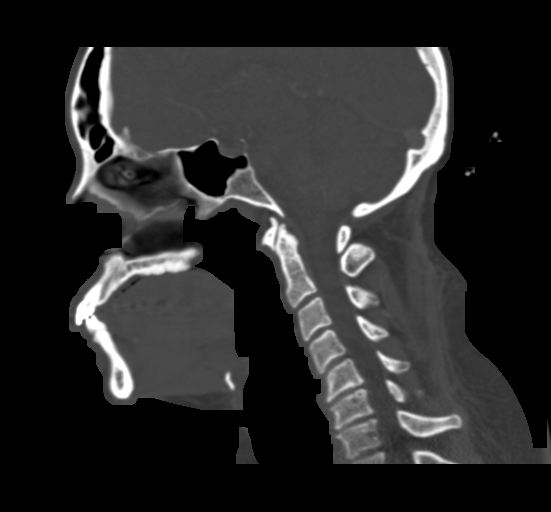
[im 61/91  bone]
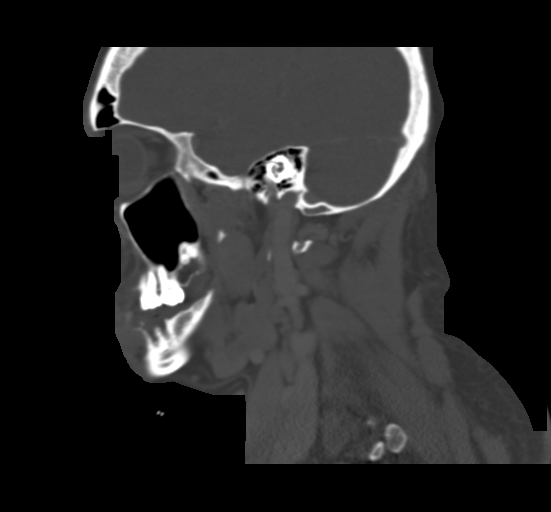

[14 of 47 positions shown; findings below may reference images not displayed]

FINDINGS: Osseous: Mandible is intact. Right first mandibular molar is intact.
Soft tissue is present within the posterior right mandible where the
second and third molars were extracted. No acute fractures are
present. The root canals are empty in the left mandible. Prominent
dental caries is present in the left maxillary second premolar.

No adjacent soft tissue swelling is present along the mandible.
Mandible is otherwise normal. Degenerative changes are present the
upper cervical spine. Osseous structures of the face are otherwise
unremarkable.

Orbits: The globes and orbits are within normal limits.

Sinuses: The paranasal sinuses and mastoid air cells are clear.

Soft tissues: Soft tissues the face are otherwise unremarkable.
Salivary glands are within normal limits.

Limited intracranial: Within normal limits.
IMPRESSION: 1. Prominent soft tissue in the posterior right mandible along
extraction site. This area should be amenable to direct
visualization. No significant inflammatory changes to suggest
infection.
2. Status post resection of left mandibular molar.
3. Left maxillary tooth dental caries.

## 2021-09-26 ENCOUNTER — Encounter: Payer: Self-pay | Admitting: *Deleted

## 2021-09-26 NOTE — Telephone Encounter (Signed)
Message sent to have patient call office.

## 2021-10-01 ENCOUNTER — Other Ambulatory Visit: Payer: Self-pay | Admitting: General Surgery

## 2021-10-01 DIAGNOSIS — D1723 Benign lipomatous neoplasm of skin and subcutaneous tissue of right leg: Secondary | ICD-10-CM | POA: Diagnosis not present

## 2021-10-02 LAB — SURGICAL PATHOLOGY

## 2021-10-14 ENCOUNTER — Other Ambulatory Visit: Payer: Self-pay | Admitting: Family Medicine

## 2021-11-18 ENCOUNTER — Telehealth: Payer: Self-pay | Admitting: Internal Medicine

## 2021-11-18 NOTE — Telephone Encounter (Signed)
Pt called stating she has a history of migraines and want to get back on Imitrex. Pt want to get back on medication because migraines are coming back ?

## 2021-11-20 DIAGNOSIS — Z79899 Other long term (current) drug therapy: Secondary | ICD-10-CM | POA: Diagnosis not present

## 2021-11-20 DIAGNOSIS — I10 Essential (primary) hypertension: Secondary | ICD-10-CM | POA: Diagnosis not present

## 2021-11-20 DIAGNOSIS — E039 Hypothyroidism, unspecified: Secondary | ICD-10-CM | POA: Diagnosis not present

## 2021-11-20 DIAGNOSIS — G43901 Migraine, unspecified, not intractable, with status migrainosus: Secondary | ICD-10-CM | POA: Diagnosis not present

## 2021-11-20 DIAGNOSIS — G43009 Migraine without aura, not intractable, without status migrainosus: Secondary | ICD-10-CM | POA: Diagnosis not present

## 2021-11-20 DIAGNOSIS — G43109 Migraine with aura, not intractable, without status migrainosus: Secondary | ICD-10-CM | POA: Diagnosis not present

## 2021-11-20 DIAGNOSIS — R110 Nausea: Secondary | ICD-10-CM | POA: Diagnosis not present

## 2021-11-20 DIAGNOSIS — K589 Irritable bowel syndrome without diarrhea: Secondary | ICD-10-CM | POA: Diagnosis not present

## 2021-11-20 DIAGNOSIS — K760 Fatty (change of) liver, not elsewhere classified: Secondary | ICD-10-CM | POA: Diagnosis not present

## 2021-11-22 DIAGNOSIS — G43901 Migraine, unspecified, not intractable, with status migrainosus: Secondary | ICD-10-CM | POA: Diagnosis not present

## 2021-12-02 ENCOUNTER — Other Ambulatory Visit: Payer: Self-pay | Admitting: Internal Medicine

## 2021-12-02 DIAGNOSIS — G43909 Migraine, unspecified, not intractable, without status migrainosus: Secondary | ICD-10-CM

## 2021-12-02 MED ORDER — SUMATRIPTAN SUCCINATE 50 MG PO TABS: ORAL_TABLET | ORAL | 5 refills | Status: DC

## 2021-12-02 NOTE — Telephone Encounter (Signed)
Left detailed message stating that since she is establish with neurology in [place] for her to reach out to them for an appt to discuss starting her back up on imitrex for hx of migraines.  ?

## 2021-12-02 NOTE — Telephone Encounter (Signed)
Sent pt has [facility]  ?She needs to call them for appt  ? ?

## 2021-12-24 DIAGNOSIS — K581 Irritable bowel syndrome with constipation: Secondary | ICD-10-CM | POA: Diagnosis not present

## 2021-12-24 DIAGNOSIS — M6289 Other specified disorders of muscle: Secondary | ICD-10-CM | POA: Diagnosis not present

## 2021-12-24 DIAGNOSIS — K760 Fatty (change of) liver, not elsewhere classified: Secondary | ICD-10-CM | POA: Diagnosis not present

## 2022-01-13 DIAGNOSIS — L858 Other specified epidermal thickening: Secondary | ICD-10-CM | POA: Diagnosis not present

## 2022-01-13 DIAGNOSIS — L811 Chloasma: Secondary | ICD-10-CM | POA: Diagnosis not present

## 2022-01-13 DIAGNOSIS — L089 Local infection of the skin and subcutaneous tissue, unspecified: Secondary | ICD-10-CM | POA: Diagnosis not present

## 2022-01-13 DIAGNOSIS — L853 Xerosis cutis: Secondary | ICD-10-CM | POA: Diagnosis not present

## 2022-01-13 DIAGNOSIS — L732 Hidradenitis suppurativa: Secondary | ICD-10-CM | POA: Diagnosis not present

## 2022-01-13 DIAGNOSIS — L910 Hypertrophic scar: Secondary | ICD-10-CM | POA: Diagnosis not present

## 2022-01-14 DIAGNOSIS — L910 Hypertrophic scar: Secondary | ICD-10-CM | POA: Insufficient documentation

## 2022-01-14 DIAGNOSIS — L858 Other specified epidermal thickening: Secondary | ICD-10-CM | POA: Insufficient documentation

## 2022-02-03 ENCOUNTER — Ambulatory Visit
Admission: EM | Admit: 2022-02-03 | Discharge: 2022-02-03 | Disposition: A | Discharge status: Home / Self Care | Payer: Medicare Other | Attending: Family Medicine | Admitting: Family Medicine

## 2022-02-03 ENCOUNTER — Ambulatory Visit (INDEPENDENT_AMBULATORY_CARE_PROVIDER_SITE_OTHER): Payer: BC Managed Care – PPO

## 2022-02-03 DIAGNOSIS — S161XXA Strain of muscle, fascia and tendon at neck level, initial encounter: Secondary | ICD-10-CM

## 2022-02-03 DIAGNOSIS — M542 Cervicalgia: Secondary | ICD-10-CM | POA: Diagnosis not present

## 2022-02-03 MED ORDER — TIZANIDINE HCL 4 MG PO TABS: 4.0000 mg | ORAL_TABLET | Freq: Four times a day (QID) | ORAL | 0 refills | Status: DC | PRN

## 2022-02-03 MED ORDER — NAPROXEN 500 MG PO TABS: 500.0000 mg | ORAL_TABLET | Freq: Two times a day (BID) | ORAL | 0 refills | Status: DC | PRN

## 2022-02-05 ENCOUNTER — Encounter: Payer: Self-pay | Admitting: Internal Medicine

## 2022-02-05 ENCOUNTER — Ambulatory Visit (INDEPENDENT_AMBULATORY_CARE_PROVIDER_SITE_OTHER): Payer: BC Managed Care – PPO | Admitting: Internal Medicine

## 2022-02-05 ENCOUNTER — Encounter: Payer: Self-pay | Admitting: Plastic Surgery

## 2022-02-05 ENCOUNTER — Ambulatory Visit (INDEPENDENT_AMBULATORY_CARE_PROVIDER_SITE_OTHER): Payer: BC Managed Care – PPO | Admitting: Plastic Surgery

## 2022-02-05 VITALS — BP 131/82 | HR 59 | Ht 72.0 in | Wt 185.0 lb

## 2022-02-05 VITALS — BP 120/80 | HR 77 | Temp 98.0°F | Resp 14 | Ht 72.01 in | Wt 195.6 lb

## 2022-02-05 DIAGNOSIS — M25512 Pain in left shoulder: Secondary | ICD-10-CM

## 2022-02-05 DIAGNOSIS — M549 Dorsalgia, unspecified: Secondary | ICD-10-CM | POA: Diagnosis not present

## 2022-02-05 DIAGNOSIS — Z803 Family history of malignant neoplasm of breast: Secondary | ICD-10-CM | POA: Diagnosis not present

## 2022-02-05 DIAGNOSIS — N62 Hypertrophy of breast: Secondary | ICD-10-CM

## 2022-02-05 DIAGNOSIS — G8929 Other chronic pain: Secondary | ICD-10-CM

## 2022-02-05 DIAGNOSIS — M47812 Spondylosis without myelopathy or radiculopathy, cervical region: Secondary | ICD-10-CM

## 2022-02-05 DIAGNOSIS — L732 Hidradenitis suppurativa: Secondary | ICD-10-CM | POA: Diagnosis not present

## 2022-02-05 DIAGNOSIS — G43909 Migraine, unspecified, not intractable, without status migrainosus: Secondary | ICD-10-CM

## 2022-02-05 DIAGNOSIS — M546 Pain in thoracic spine: Secondary | ICD-10-CM

## 2022-02-05 DIAGNOSIS — R21 Rash and other nonspecific skin eruption: Secondary | ICD-10-CM | POA: Diagnosis not present

## 2022-02-05 DIAGNOSIS — I10 Essential (primary) hypertension: Secondary | ICD-10-CM | POA: Diagnosis not present

## 2022-02-05 DIAGNOSIS — M542 Cervicalgia: Secondary | ICD-10-CM

## 2022-02-05 DIAGNOSIS — M545 Low back pain, unspecified: Secondary | ICD-10-CM

## 2022-02-05 DIAGNOSIS — M4004 Postural kyphosis, thoracic region: Secondary | ICD-10-CM

## 2022-02-05 MED ORDER — SPIRONOLACTONE 50 MG PO TABS: 50.0000 mg | ORAL_TABLET | Freq: Every day | ORAL | 3 refills | Status: DC

## 2022-02-05 MED ORDER — TRAMADOL HCL 50 MG PO TABS: 50.0000 mg | ORAL_TABLET | Freq: Two times a day (BID) | ORAL | 0 refills | Status: AC | PRN

## 2022-02-05 MED ORDER — SUMATRIPTAN SUCCINATE 50 MG PO TABS: ORAL_TABLET | ORAL | 5 refills | Status: DC

## 2022-02-05 MED ORDER — AMLODIPINE BESYLATE 10 MG PO TABS: 10.0000 mg | ORAL_TABLET | Freq: Every day | ORAL | 3 refills | Status: DC

## 2022-02-05 NOTE — Progress Notes (Signed)
Chief Complaint  Patient presents with   Follow-up    6 mon, pt c/o back & neck pain ongoing for awhile, denies any injuries. Pt was seen at [facility] for same issue they did x-ray which showed arthritis and was given muscle relaxer's which haven't been helping.    F/u  1. Chronic neck pain and ROM issues and left shoulder pain 10/10 tried nsaids tylenol w/o help and flexeril 5 mg qhs prn  CT CERVICAL SPINE FINDINGS     Alignment: Mild reversal of the normal cervical lordosis. No  subluxation/spondylolisthesis.     Skull base and vertebrae: No acute fracture. No primary bone lesion  or focal pathologic process.     Soft tissues and spinal canal: No prevertebral fluid or swelling. No  visible canal hematoma.     Disc levels: Mild loss of disc height at C3-C4, C5-C6 and C6-C7.  Mild spondylotic disc bulging. Facet degenerative change, relatively  mild, most prominent on the left at C3-C4. No convincing disc  herniation.     Upper chest: No acute findings. No mass or enlarged lymph nodes.  Clear lung apices.     Other: None.     IMPRESSION:  HEAD CT     1. No intracranial abnormality. No skull fracture.  2. No fracture or acute finding.     CERVICAL CT     1. No fracture or acute finding.   MRI  IMPRESSION:  1. Multilevel uncovertebral and facet hypertrophy, resulting in  neural foraminal stenosis at multiple levels, worst at left C5-6 and  bilateral C3-4.  2. No spinal canal stenosis.        Electronically Signed    By: Ulyses Jarred M.D.    On: 02/07/2018 03:36      Review of Systems  Constitutional:  Negative for weight loss.  HENT:  Negative for hearing loss.   Eyes:  Negative for blurred vision.  Respiratory:  Negative for shortness of breath.   Cardiovascular:  Negative for chest pain.  Gastrointestinal:  Negative for abdominal pain and blood in stool.  Genitourinary:  Negative for dysuria.  Musculoskeletal:  Negative for falls and joint pain.  Skin:   Negative for rash.  Neurological:  Negative for headaches.  Psychiatric/Behavioral:  Negative for depression.    Past Medical History:  Diagnosis Date   Abdominal pain, epigastric 12/17/2015   Anxiety    pt denies having dx of anxiety   BCC (basal cell carcinoma of skin)    right foot    Bunion, right foot    Chronic cough    Closed fibular fracture 10/2008   Left - Sustained 2/2 fall down stairs (same fall as tibular fracture). S/P internal fixation.   COVID-19    mild back pain, brain fog 01/2021   Duodenitis    EGD (07/14/2011) showing chronic duodenitis, H.pyloir neg   Elevated liver enzymes    Endometriosis    S/P TAH and salpingo-oophorectomy, right   Epigastric pain    Chronic, thought 2/2 gastritis, EGD (07/14/2011) showing chronic duodenitis, H.pyloir neg // Repeat EGD (09/2011) - esophagealring, sessible polyp in stomach body, hiatal hernia - rec ad probiotic, await bx   Esophageal ring    s/p dilatation during EGD (07/2011) - Dr. 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 With chronic OA of hip   History of migraine headaches    Typical symptoms - bright lights, unilateral (starting behind her left ear), throbbing .   Hot flashes    Hypertension    Internal hemorrhoids    Osteoarthritis of hip    Ovarian cancer ([place]) 1995   Protrusio acetabuli    diagnosed at age 59   Sessile colonic polyp    noted 09/2011 -- > rec colonoscopy in 10 years, 2023   Surgical menopause 08/2007   On HRT. Occuring since 01/09 following TAH and R-salpingo-oophorectomy    Tibial fracture 10/2008   Left - Sustained 2/2 fall down stairs. Patient is S/P closed internal fixation with Smith Nephew tibial nail locked proximal and distal   Vitamin  D deficiency 11/2009   Vit D level 15 in 11/2009   Past Surgical History:  Procedure Laterality Date   ABDOMINAL HYSTERECTOMY     CESAREAN SECTION  08/2005   Primary low transverse   COLONOSCOPY  09/22/2011   [id number] polyp in the rectum/Internal hemorrhoids, benign path   ESOPHAGOGASTRODUODENOSCOPY  09/22/2011   ONG:EXBM, esophageal/Sessile polyp in the body of the stomach/Hiatal hernia/ABDOMINAL PAIN LIKELY FUNCTIONAL V. POST-INFECTIOUS IBS, path: mild chronic gastritis   FRACTURE SURGERY     Right hip   JOINT REPLACEMENT     right hip   LAPAROSCOPY  10/2000   Operative laparoscopy with lysis of right adnexal adhesions and uterointestinal adhesions   left tib fib     OTHER SURGICAL HISTORY  02/2009   Closed treatment internal fixation, left tibia with Tamala Julian and Nephew tibial nail locked proximal and distal   TOTAL ABDOMINAL HYSTERECTOMY W/ BILATERAL SALPINGOOPHORECTOMY  08/2007   for endometriosis. With concurrent right salpingo-oophorectomy  (2009), left oophorectomy (1995)   TUBAL LIGATION  08/2005   Right tubal ligation   Family History  Problem Relation Age of Onset   Other Father        deceased while in service.    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   Patient is married with two children, Madison and Tamera Punt (son 59 y.o  as of 03/22/20 going to [employer]).    She lives in [place] but children go to school in [place]    Right handed   Caffeine: no   Social Determinants of Health   Financial Resource Strain: Not on file  Food Insecurity: Not on file  Transportation Needs: Not on file  Physical Activity: Not on file  Stress: Not on file  Social Connections: Not on file  Intimate Partner Violence: Not on file   Current Meds  Medication Sig   cyclobenzaprine (FLEXERIL) 5 MG tablet Take 1 tablet (5 mg total) by mouth daily as needed for muscle spasms.   Multiple Vitamin (MULTIVITAMIN PO) Take by mouth.   traMADol (ULTRAM) 50 MG tablet Take 1 tablet (50 mg total) by mouth every 12 (twelve) hours as needed for up to 7 days.   [DISCONTINUED] amLODipine (NORVASC) 10 MG tablet Take 1 tablet (10 mg total) by mouth daily.   [DISCONTINUED] fluconazole (DIFLUCAN) 150 MG tablet Take 1 tablet (150 mg total) by mouth daily.   [DISCONTINUED] naproxen (NAPROSYN) 500 MG tablet Take 1 tablet (500 mg total) by mouth 2 (two) times daily as needed.   [DISCONTINUED] spironolactone (ALDACTONE) 50 MG tablet Take 1 tablet (50 mg total) by mouth daily. In am   [DISCONTINUED] SUMAtriptan (IMITREX) 50 MG tablet Can take an additional dose 2 hours later if initial dose ineffective. Do not take more than 4 tablets/ day.   Allergies  Allergen Reactions   Pineapple Shortness Of Breath and Swelling    Has Epi pen   Eggs Or Egg-Derived Products     GI issues    Hctz [Hydrochlorothiazide]     Headaches, foggy headed     Latex Swelling   Lisinopril     Cough    Morphine And Related Itching   Tape Swelling   No results found  for this or any previous visit (from the past 2160 hour(s)). Objective  Body mass index is 26.52 kg/m. Wt Readings from Last 3 Encounters:  02/05/22 195 lb 9.6 oz (88.7 kg)  08/07/21 192 lb (87.1 kg)  07/25/21 192 lb 6.4 oz (87.3 kg)   Temp Readings from Last 3 Encounters:  02/05/22 98 F (36.7 C) (Oral)  02/03/22 98.8 F (37.1 C) (Oral)  08/28/21 98.3 F (36.8 C) (Oral)   BP Readings from Last 3 Encounters:  02/05/22 120/80  02/03/22 137/90  08/28/21 122/83   Pulse Readings from Last 3 Encounters:  02/05/22 77  02/03/22 66  08/28/21 63    Physical Exam Vitals and nursing note reviewed.  Constitutional:      Appearance: Normal appearance. She is well-developed and well-groomed.  HENT:     Head: Normocephalic and atraumatic.  Eyes:     Conjunctiva/sclera: Conjunctivae normal.     Pupils: Pupils are equal, round, and reactive to light.  Cardiovascular:     Rate and Rhythm: Normal rate and regular rhythm.     Heart sounds: Normal heart sounds. No murmur heard. Pulmonary:     Effort: Pulmonary effort is normal.     Breath sounds: Normal breath sounds.  Abdominal:     General: Abdomen is flat. Bowel sounds are normal.     Tenderness: There is no abdominal tenderness.  Musculoskeletal:     Cervical back: Tenderness present. Pain with movement present. Decreased range of motion.  Skin:    General: Skin is warm and dry.  Neurological:     General: No focal deficit present.  Mental Status: She is alert and oriented to person, place, and time. Mental status is at baseline.     Cranial Nerves: Cranial nerves 2-12 are intact.     Motor: Motor function is intact.     Coordination: Coordination is intact.     Gait: Gait is intact.  Psychiatric:        Attention and Perception: Attention and perception normal.        Mood and Affect: Mood and affect normal.        Speech: Speech normal.        Behavior: Behavior normal. Behavior is cooperative.        Thought  Content: Thought content normal.        Cognition and Memory: Cognition and memory normal.        Judgment: Judgment normal.     Assessment  Plan  Cervical arthritis - Plan: Ambulatory referral to Physical Therapy for dry needling, traMADol (ULTRAM) 50 MG tablet bid prn Consider PM&R for epidural injections with stenosis in neck as well  Chronic left shoulder pain - Plan: Ambulatory referral to Physical Therapy, traMADol (ULTRAM) 50 MG tablet  Hidradenitis suppurativa - Plan: spironolactone (ALDACTONE) 50 MG tablet  Migraine headache - Plan: SUMAtriptan (IMITREX) 50 MG tablet  Essential hypertension controlled - Plan: amLODipine (NORVASC) 10 MG tablet  Chronic neck pain - Plan: traMADol (ULTRAM) 50 MG tablet   HM Flu shot will get in future egg free  Hep B immune, consider hep A vaccine h/o fatty liver  Tdap up to date 06/05/16 pna 23 had 02/19/09 HCV neg 06/05/16  HIV neg 06/05/16 covid 3/3 sch 06/15/21 4th dose had covid 01/2021    mammo 06/20/18 neg referred again [facility] with dexa  -sch 11/2021had mammo and dexa Sch 06/17/21      Get copy of pap [facility] [place] saw 2019 s/p hysterectomy for endometriosis in 2007 prior LSO in 1995 for ovarian issues per OB/GYN notes 05/2018 [facility] -no rec paps per OB/GYN no h/o abnormals per note 05/14/18 ob/gyn     Colonoscopy having done [facility] unable to due did CT colonoscopy and utd no issues chronic IBS-C    Rec healthy diet and exercise Provider: Dr. Olivia Mackie McLean-Scocuzza-Internal Medicine

## 2022-02-05 NOTE — Patient Instructions (Addendum)
Heat/ice Voltaren gel or lidocaine pain patch or aspercream with lidocaine  Tramadol with Tylenol   [facility] physical therapy Get online care: [email or web address] Address: [address], [place],  [address] Hours:  Open ? Closes 7:30?PM Phone: [phone]  Plastic surgery Dr. Claudia Desanctis  Phone Fax E-mail Address  [phone] Not available [place] [address]

## 2022-02-05 NOTE — Progress Notes (Signed)
Referring Provider McLean-Scocuzza, Nino Glow, MD [place],  [place] [address]   CC:  Chief Complaint  Patient presents with   Consult           [name] is an 46 y.o. female.  HPI: Patient presents to discuss breast reduction.  She previously saw me a year or 2 ago for this but her insurance denied it saying that her breasts were not large enough.  She has continued to have significant back and neck pain.  She has tried physical therapy once and is now doing a second series of physical therapy for back and neck pain that has not helped relieve her symptoms.  For years she has had back pain neck pain shoulder grooving related to her large breast.  She tried over-the-counter medications, warm packs, cold packs and supportive bras with little relief.  She also gets skin irritation beneath her breast that been refractory to over-the-counter treatments.  She does not smoke and is not diabetic.  Allergies  Allergen Reactions   Pineapple Shortness Of Breath and Swelling    Has Epi pen   Eggs Or Egg-Derived Products     GI issues    Hctz [Hydrochlorothiazide]     Headaches, foggy headed     Latex Swelling   Lisinopril     Cough    Morphine And Related Itching   Tape Swelling    Outpatient Encounter Medications as of 02/05/2022  Medication Sig   amLODipine (NORVASC) 10 MG tablet Take 1 tablet (10 mg total) by mouth daily.   cyclobenzaprine (FLEXERIL) 5 MG tablet Take 1 tablet (5 mg total) by mouth daily as needed for muscle spasms.   hydroquinone 4 % cream Apply topically at bedtime. qhs x 3-4 months then 2x per week as needed   Multiple Vitamin (MULTIVITAMIN PO) Take by mouth.   nystatin cream (MYCOSTATIN) Apply to affected area 2 times daily   spironolactone (ALDACTONE) 50 MG tablet Take 1 tablet (50 mg total) by mouth daily. In am   SUMAtriptan (IMITREX) 50 MG tablet Can take an additional dose 2 hours later if initial dose ineffective. Do not take more  than 4 tablets/ day.   tiZANidine (ZANAFLEX) 4 MG tablet Take 1 tablet (4 mg total) by mouth every 6 (six) hours as needed for muscle spasms. Do not drink alcohol or drive while taking this medication.  May cause drowsiness.   traMADol (ULTRAM) 50 MG tablet Take 1 tablet (50 mg total) by mouth every 12 (twelve) hours as needed for up to 7 days.   VITAMIN E PO Take by mouth.   No facility-administered encounter medications on file as of 02/05/2022.     Past Medical History:  Diagnosis Date   Abdominal pain, epigastric 12/17/2015   Anxiety    pt denies having dx of anxiety   BCC (basal cell carcinoma of skin)    right foot    Bunion, right foot    Chronic cough    Closed fibular fracture 10/2008   Left - Sustained 2/2 fall down stairs (same fall as tibular fracture). S/P internal fixation.   COVID-19    mild back pain, brain fog 01/2021   Duodenitis    EGD (07/14/2011) showing chronic duodenitis, H.pyloir neg   Elevated liver enzymes    Endometriosis    S/P TAH and salpingo-oophorectomy, right   Epigastric pain    Chronic, thought 2/2 gastritis, EGD (07/14/2011) showing chronic duodenitis, H.pyloir neg // Repeat EGD (  09/2011) - esophagealring, sessible polyp in stomach body, hiatal hernia - rec ad probiotic, await bx   Esophageal ring    s/p dilatation during EGD (07/2011) - Dr. 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 With chronic OA of hip   History of migraine headaches    Typical symptoms - bright lights, unilateral (starting behind her left ear), throbbing .   Hot flashes    Hypertension    Internal hemorrhoids    Osteoarthritis of hip    Ovarian cancer ([place]) 1995   Protrusio acetabuli    diagnosed at age 48   Sessile  colonic polyp    noted 09/2011 -- > rec colonoscopy in 10 years, 2023   Surgical menopause 08/2007   On HRT. Occuring since 01/09 following TAH and R-salpingo-oophorectomy    Tibial fracture 10/2008   Left - Sustained 2/2 fall down stairs. Patient is S/P closed internal fixation with Smith Nephew tibial nail locked proximal and distal   Vitamin D deficiency 11/2009   Vit D level 15 in 11/2009    Past Surgical History:  Procedure Laterality Date   ABDOMINAL HYSTERECTOMY     CESAREAN SECTION  08/2005   Primary low transverse   COLONOSCOPY  09/22/2011   [id number] polyp in the rectum/Internal hemorrhoids, benign path   ESOPHAGOGASTRODUODENOSCOPY  09/22/2011   DEY:CXKG, esophageal/Sessile polyp in the body of the stomach/Hiatal hernia/ABDOMINAL PAIN LIKELY FUNCTIONAL V. POST-INFECTIOUS IBS, path: mild chronic gastritis   FRACTURE SURGERY     Right hip   JOINT REPLACEMENT     right hip   LAPAROSCOPY  10/2000   Operative laparoscopy with lysis of right adnexal adhesions and uterointestinal adhesions   left tib fib     OTHER SURGICAL HISTORY  02/2009   Closed treatment internal fixation, left tibia with Tamala Julian and Nephew tibial nail locked proximal and distal   TOTAL ABDOMINAL HYSTERECTOMY W/ BILATERAL SALPINGOOPHORECTOMY  08/2007   for endometriosis. With concurrent right salpingo-oophorectomy  (2009), left oophorectomy (1995)   TUBAL LIGATION  08/2005   Right tubal ligation    Family History  Problem Relation Age of Onset   Other Father        deceased while in service.    Breast cancer Sister 78       remission   Cancer Sister        breast   Lupus Cousin    Other Mother        unknown medical history   Dementia Maternal Grandmother        at old age   Parkinson's disease Maternal Grandfather    Multiple sclerosis Other        uncle    Colon cancer Neg Hx    Liver disease Neg Hx    Migraines Neg Hx     Social History   Social History Narrative   Insurance: Information systems manager.    Patient is married with two children, Madison and Tamera Punt (son 32 y.o as of 03/22/20 going to [employer]).    She lives in [place] but children go to school in [place]    Right handed   Caffeine: no     Review of Systems General: Denies fevers, chills, weight loss  CV: Denies chest pain, shortness of breath, palpitations  Physical Exam    02/05/2022    2:53 PM 02/05/2022    8:54 AM 02/03/2022    7:42 PM  Vitals with BMI  Height '6\' 0"'$  6' 0.01"   Weight 185 lbs 195 lbs 10 oz   BMI 16.10 96.04   Systolic 540 981 191  Diastolic 82 80 90  Pulse 59 77 66    General:  No acute distress,  Alert and oriented, Non-Toxic, Normal speech and affect Breast: She has grade 3 ptosis.  Sternal notch to nipple is 31 cm bilaterally.  Nipple to fold is 14 cm bilaterally.  No obvious scars or masses.  Assessment/Plan The patient has bilateral symptomatic macromastia.  She is a good candidate for a breast reduction.  She is interested in pursuing surgical treatment.  She has tried supportive garments and fitted bras with no relief.  The details of breast reduction surgery were discussed.  I explained the procedure in detail along the with the expected scars.  The risks were discussed in detail and include bleeding, infection, damage to surrounding structures, need for additional procedures, nipple loss, change in nipple sensation, persistent pain, contour irregularities and asymmetries.  I explained that breast feeding is often not possible after breast reduction surgery.  We discussed the expected postoperative course with an overall recovery period of about 1 month.  She demonstrated full understanding of all risks.  We discussed her personal risk factors.  The patient is interested in pursuing surgical treatment.  I anticipate approximately 700g of tissue removed from each side.   Cindra Presume 02/05/2022, 3:17 PM

## 2022-02-07 NOTE — ED Provider Notes (Signed)
[facility]    CSN: [id number] Arrival date & time: 02/03/22  1854      History   Chief Complaint Chief Complaint  Patient presents with   Neck Pain    HPI [name] is a 46 y.o. female.   Presenting today with several day history of left-sided neck pain, stiffness, soreness with no known injury.  She states the pain started out of the blue and seems to be worse over time.  Has had some swelling over the center collarbone region on the left that she states does seem to be getting a bit better at this time.  Denies pain radiating down the left arm, numbness, tingling, weakness, injury to the area.  Has tried heat, massage, ibuprofen with only mild relief of symptoms.    Past Medical History:  Diagnosis Date   Abdominal pain, epigastric 12/17/2015   Anxiety    pt denies having dx of anxiety   BCC (basal cell carcinoma of skin)    right foot    Bunion, right foot    Chronic cough    Closed fibular fracture 10/2008   Left - Sustained 2/2 fall down stairs (same fall as tibular fracture). S/P internal fixation.   COVID-19    mild back pain, brain fog 01/2021   Duodenitis    EGD (07/14/2011) showing chronic duodenitis, H.pyloir neg   Elevated liver enzymes    Endometriosis    S/P TAH and salpingo-oophorectomy, right   Epigastric pain    Chronic, thought 2/2 gastritis, EGD (07/14/2011) showing chronic duodenitis, H.pyloir neg // Repeat EGD (09/2011) - esophagealring, sessible polyp in stomach body, hiatal hernia - rec ad probiotic, await bx   Esophageal ring    s/p dilatation during EGD (07/2011) - Dr. 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 With chronic OA of hip   History of migraine headaches     Typical symptoms - bright lights, unilateral (starting behind her left ear), throbbing .   Hot flashes    Hypertension    Internal hemorrhoids    Osteoarthritis of hip    Ovarian cancer ([place]) 1995   Protrusio acetabuli    diagnosed at age 25   Sessile colonic polyp    noted 09/2011 -- > rec colonoscopy in 10 years, 2023   Surgical menopause 08/2007   On HRT. Occuring since 01/09 following TAH and R-salpingo-oophorectomy    Tibial fracture 10/2008   Left - Sustained 2/2 fall down stairs. Patient is S/P closed internal fixation with Smith Nephew tibial nail locked proximal and distal   Vitamin D deficiency 11/2009   Vit D level 15 in 11/2009    Patient Active Problem List   Diagnosis Date Noted   Lipoma of right thigh 09/19/2021   Pain of left mastoid 08/07/2021   Sinus mucosal thickening 08/07/2021   Cervical arthritis 08/07/2021   Mass of right thigh 07/08/2021   Chronic neck pain 03/22/2020   Prediabetes 12/23/2019   Melasma 12/21/2019   Irritable bowel syndrome with constipation 12/21/2019   Concussion with no loss of consciousness 08/12/2018   Acute pain of left shoulder 08/12/2018   Low back pain 08/12/2018   Allergic rhinitis 08/05/2018   Arthritis 07/01/2018  DDD (degenerative disc disease), lumbar 05/21/2018   Thoracic spondylosis 05/21/2018   DDD (degenerative disc disease), cervical 01/06/2018   Hidradenitis suppurativa 10/11/2017   Chronic frontal sinusitis 10/11/2017   Fatty liver 10/11/2017   Chronic ethmoidal sinusitis 09/13/2017   Photophobia of both eyes 09/13/2017   Abnormal liver enzymes 09/13/2017   History of hysterectomy with bilateral oophorectomy 06/24/2017   Estrogen deficiency 06/24/2017   Overweight (BMI 25.0-29.9) 11/17/2016   Abdominal pain 11/04/2016   Headache 12/17/2015   Chronic constipation 10/12/2012   Hot flashes 02/20/2012   Hiatal hernia 02/20/2012   Chronic pain of both hips 02/20/2012   Fatigue 12/19/2011   Anemia 06/27/2011    Acetabulum intrapelvic protrusion into pelvic region or thigh 05/15/2011   Annual physical exam 10/03/2010   Hypertension 03/20/2010   Vitamin D deficiency 08/20/2009   Anxiety 05/18/2009   Migraine headache 05/13/2006    Past Surgical History:  Procedure Laterality Date   ABDOMINAL HYSTERECTOMY     CESAREAN SECTION  08/2005   Primary low transverse   COLONOSCOPY  09/22/2011   NAT:FTDDUKG polyp in the rectum/Internal hemorrhoids, benign path   ESOPHAGOGASTRODUODENOSCOPY  09/22/2011   URK:YHCW, esophageal/Sessile polyp in the body of the stomach/Hiatal hernia/ABDOMINAL PAIN LIKELY FUNCTIONAL V. POST-INFECTIOUS IBS, path: mild chronic gastritis   FRACTURE SURGERY     Right hip   JOINT REPLACEMENT     right hip   LAPAROSCOPY  10/2000   Operative laparoscopy with lysis of right adnexal adhesions and uterointestinal adhesions   left tib fib     OTHER SURGICAL HISTORY  02/2009   Closed treatment internal fixation, left tibia with Tamala Julian and Nephew tibial nail locked proximal and distal   TOTAL ABDOMINAL HYSTERECTOMY W/ BILATERAL SALPINGOOPHORECTOMY  08/2007   for endometriosis. With concurrent right salpingo-oophorectomy  (2009), left oophorectomy (1995)   TUBAL LIGATION  08/2005   Right tubal ligation    OB History   No obstetric history on file.      Home Medications    Prior to Admission medications   Medication Sig Start Date End Date Taking? Authorizing Provider  tiZANidine (ZANAFLEX) 4 MG tablet Take 1 tablet (4 mg total) by mouth every 6 (six) hours as needed for muscle spasms. Do not drink alcohol or drive while taking this medication.  May cause drowsiness. 02/03/22  Yes Volney American, PA-C  amLODipine (NORVASC) 10 MG tablet Take 1 tablet (10 mg total) by mouth daily. 02/05/22   McLean-Scocuzza, Nino Glow, MD  cyclobenzaprine (FLEXERIL) 5 MG tablet Take 1 tablet (5 mg total) by mouth daily as needed for muscle spasms. 08/07/21   McLean-Scocuzza, Nino Glow, MD   hydroquinone 4 % cream Apply topically at bedtime. qhs x 3-4 months then 2x per week as needed 12/21/19   McLean-Scocuzza, Nino Glow, MD  Multiple Vitamin (MULTIVITAMIN PO) Take by mouth.    [provider]  nystatin cream (MYCOSTATIN) Apply to affected area 2 times daily 08/28/21   Volney American, PA-C  spironolactone (ALDACTONE) 50 MG tablet Take 1 tablet (50 mg total) by mouth daily. In am 02/05/22   McLean-Scocuzza, Nino Glow, MD  SUMAtriptan (IMITREX) 50 MG tablet Can take an additional dose 2 hours later if initial dose ineffective. Do not take more than 4 tablets/ day. 02/05/22   McLean-Scocuzza, Nino Glow, MD  traMADol (ULTRAM) 50 MG tablet Take 1 tablet (50 mg total) by mouth every 12 (twelve) hours as needed for up to 7 days. 02/05/22 02/12/22  McLean-Scocuzza, Olivia Mackie  N, MD  VITAMIN E PO Take by mouth.    [provider]    Family History Family History  Problem Relation Age of Onset   Other Father        deceased while in service.    Breast cancer Sister 21       remission   Cancer Sister        breast   Lupus Cousin    Other Mother        unknown medical history   Dementia Maternal Grandmother        at old age   Parkinson's disease Maternal Grandfather    Multiple sclerosis Other        uncle    Colon cancer Neg Hx    Liver disease Neg Hx    Migraines Neg Hx     Social History Social History   Tobacco Use   Smoking status: Never   Smokeless tobacco: Never  Vaping Use   Vaping Use: Never used  Substance Use Topics   Alcohol use: No    Alcohol/week: 0.0 standard drinks of alcohol   Drug use: No     Allergies   Pineapple, Eggs or egg-derived products, Hctz [hydrochlorothiazide], Latex, Lisinopril, Morphine and related, and Tape   Review of Systems Review of Systems Per HPI  Physical Exam Triage Vital Signs ED Triage Vitals  Enc Vitals Group     BP 02/03/22 1942 137/90     Pulse Rate 02/03/22 1942 66     Resp 02/03/22 1942 16      Temp 02/03/22 1942 98.8 F (37.1 C)     Temp Source 02/03/22 1942 Oral     SpO2 02/03/22 1942 98 %     Weight --      Height --      Head Circumference --      Peak Flow --      Pain Score 02/03/22 1956 7     Pain Loc --      Pain Edu? --      Excl. in [place]? --    No data found.  Updated Vital Signs BP 137/90 (BP Location: Right Arm)   Pulse 66   Temp 98.8 F (37.1 C) (Oral)   Resp 16   SpO2 98%   Visual Acuity Right Eye Distance:   Left Eye Distance:   Bilateral Distance:    Right Eye Near:   Left Eye Near:    Bilateral Near:     Physical Exam Vitals and nursing note reviewed.  Constitutional:      Appearance: Normal appearance. She is not ill-appearing.  HENT:     Head: Atraumatic.  Eyes:     Extraocular Movements: Extraocular movements intact.     Conjunctiva/sclera: Conjunctivae normal.  Cardiovascular:     Rate and Rhythm: Normal rate and regular rhythm.     Heart sounds: Normal heart sounds.  Pulmonary:     Effort: Pulmonary effort is normal.     Breath sounds: Normal breath sounds.  Musculoskeletal:        General: Tenderness present. Normal range of motion.     Cervical back: Normal range of motion and neck supple.     Comments: No midline spinal tenderness to palpation diffusely.  Range of motion cervical spine intact.  Left SCM and trapezius tender to palpation and mild spasm.  Range of motion of the left upper extremity intact, grip strength full and equal bilateral extremities.  Skin:  General: Skin is warm and dry.     Findings: No bruising or erythema.  Neurological:     Mental Status: She is alert and oriented to person, place, and time.     Comments: Left upper extremity neurovascularly intact  Psychiatric:        Mood and Affect: Mood normal.        Thought Content: Thought content normal.        Judgment: Judgment normal.      UC Treatments / Results  Labs (all labs ordered are listed, but only abnormal results are displayed) Labs  Reviewed - No data to display  EKG   Radiology No results found.  Procedures Procedures (including critical care time)  Medications Ordered in UC Medications - No data to display  Initial Impression / Assessment and Plan / UC Course  I have reviewed the triage vital signs and the nursing notes.  Pertinent labs & imaging results that were available during my care of the patient were reviewed by me and considered in my medical decision making (see chart for details).     No evidence of an acute bony injury to either area, however patient requesting x-ray of the cervical spine so this was performed.  This showed chronic degenerative changes but no acute bony abnormality.  Suspect cervical strain.  Treat with Zanaflex, naproxen, heat, rest, stretches.  Return for worsening symptoms.  Final Clinical Impressions(s) / UC Diagnoses   Final diagnoses:  Acute strain of neck muscle, initial encounter   Discharge Instructions   None    ED Prescriptions     Medication Sig Dispense Auth. Provider   tiZANidine (ZANAFLEX) 4 MG tablet Take 1 tablet (4 mg total) by mouth every 6 (six) hours as needed for muscle spasms. Do not drink alcohol or drive while taking this medication.  May cause drowsiness. 20 tablet Volney American, [place]   naproxen (NAPROSYN) 500 MG tablet Take 1 tablet (500 mg total) by mouth 2 (two) times daily as needed. Patient not taking:  Reported on 02/05/2022 30 tablet Volney American, [place]      PDMP not reviewed this encounter.   Volney American, [place] 02/07/22 1201

## 2022-02-28 ENCOUNTER — Ambulatory Visit
Admission: EM | Admit: 2022-02-28 | Discharge: 2022-02-28 | Disposition: A | Discharge status: Home / Self Care | Payer: BC Managed Care – PPO | Attending: Physician Assistant | Admitting: Physician Assistant

## 2022-02-28 DIAGNOSIS — M545 Low back pain, unspecified: Secondary | ICD-10-CM | POA: Diagnosis not present

## 2022-02-28 DIAGNOSIS — R1030 Lower abdominal pain, unspecified: Secondary | ICD-10-CM | POA: Diagnosis not present

## 2022-02-28 DIAGNOSIS — R3915 Urgency of urination: Secondary | ICD-10-CM | POA: Diagnosis not present

## 2022-02-28 LAB — URINALYSIS, ROUTINE W REFLEX MICROSCOPIC
Bilirubin Urine: NEGATIVE
Glucose, UA: NEGATIVE mg/dL
Hgb urine dipstick: NEGATIVE
Ketones, ur: NEGATIVE mg/dL
Leukocytes,Ua: NEGATIVE
Nitrite: NEGATIVE
Protein, ur: NEGATIVE mg/dL
Specific Gravity, Urine: 1.015 (ref 1.005–1.030)
pH: 5.5 (ref 5.0–8.0)

## 2022-02-28 NOTE — ED Triage Notes (Signed)
Pt c/o Possible UTI. Pt is having lower back and abdominal pain x2days.   Pt recently had a soda (pepsi) and states that she does not drink soda. Pt went to bed with a slight discomfort and is now having pain with a odor to her urine.   Pt last took ibuprofen for pain at 6pm.

## 2022-02-28 NOTE — ED Provider Notes (Signed)
[facility]    CSN: [id number] Arrival date & time: 02/28/22  1845      History   Chief Complaint Chief Complaint  Patient presents with   Urinary Tract Infection    HPI [name] is a 46 y.o. female presenting for bilateral lower back pain for over 1 week.  Patient also reports that it seems to radiate all the way across her lower abdomen and cause a cramping.  She reports urinary urgency and foul-smelling urine.  Patient also reports that she drank a Pepsi which she has not had in over 10 years and believes that caused her to have a UTI.  She says she has taken ibuprofen for the pain and it does help but she has increased pain when it wears off.  Reports she did not get out of bed yesterday because her back hurts about.  She does have a history of chronic neck pain and goes to physical therapy for this.  She took cyclobenzaprine recently as well for her back.  Patient is most concerned about a potential UTI.  She has not had any fevers, fatigue, nausea/vomiting/diarrhea.  Denies constipation.  Has not had any vaginal discharge, itching or odor.  Denies concern for STIs.  Patient does have history of IBS and fatty liver.  She recently saw GI specialist 2 months ago for IBS with constipation and pelvic floor dysfunction.  Patient takes lactulose as needed and daily fiber.  Also goes to physical therapy.  HPI  Past Medical History:  Diagnosis Date   Abdominal pain, epigastric 12/17/2015   Anxiety    pt denies having dx of anxiety   BCC (basal cell carcinoma of skin)    right foot    Bunion, right foot    Chronic cough    Closed fibular fracture 10/2008   Left - Sustained 2/2 fall down stairs (same fall as tibular fracture). S/P internal fixation.   COVID-19    mild back pain, brain fog 01/2021   Duodenitis    EGD (07/14/2011) showing chronic duodenitis, H.pyloir neg   Elevated liver enzymes    Endometriosis    S/P TAH and salpingo-oophorectomy, right    Epigastric pain    Chronic, thought 2/2 gastritis, EGD (07/14/2011) showing chronic duodenitis, H.pyloir neg // Repeat EGD (09/2011) - esophagealring, sessible polyp in stomach body, hiatal hernia - rec ad probiotic, await bx   Esophageal ring    s/p dilatation during EGD (07/2011) - Dr. 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 With chronic OA of hip   History of migraine headaches    Typical symptoms - bright lights, unilateral (starting behind her left ear), throbbing .   Hot flashes    Hypertension    Internal hemorrhoids    Osteoarthritis of hip    Ovarian cancer ([place]) 1995   Protrusio acetabuli    diagnosed at age 72   Sessile colonic polyp    noted 09/2011 -- > rec colonoscopy in 10 years, 2023   Surgical menopause 08/2007   On HRT. Occuring since 01/09 following TAH and R-salpingo-oophorectomy    Tibial fracture 10/2008   Left - Sustained 2/2 fall down stairs. Patient is S/P closed internal fixation with  Smith Nephew tibial nail locked proximal and distal   Vitamin D deficiency 11/2009   Vit D level 15 in 11/2009    Patient Active Problem List   Diagnosis Date Noted   Lipoma of right thigh 09/19/2021   Pain of left mastoid 08/07/2021   Sinus mucosal thickening 08/07/2021   Cervical arthritis 08/07/2021   Mass of right thigh 07/08/2021   Chronic neck pain 03/22/2020   Prediabetes 12/23/2019   Melasma 12/21/2019   Irritable bowel syndrome with constipation 12/21/2019   Concussion with no loss of consciousness 08/12/2018   Acute pain of left shoulder 08/12/2018   Low back pain 08/12/2018   Allergic rhinitis 08/05/2018   Arthritis 07/01/2018   DDD (degenerative disc disease), lumbar 05/21/2018   Thoracic spondylosis  05/21/2018   DDD (degenerative disc disease), cervical 01/06/2018   Hidradenitis suppurativa 10/11/2017   Chronic frontal sinusitis 10/11/2017   Fatty liver 10/11/2017   Chronic ethmoidal sinusitis 09/13/2017   Photophobia of both eyes 09/13/2017   Abnormal liver enzymes 09/13/2017   History of hysterectomy with bilateral oophorectomy 06/24/2017   Estrogen deficiency 06/24/2017   Overweight (BMI 25.0-29.9) 11/17/2016   Abdominal pain 11/04/2016   Headache 12/17/2015   Chronic constipation 10/12/2012   Hot flashes 02/20/2012   Hiatal hernia 02/20/2012   Chronic pain of both hips 02/20/2012   Fatigue 12/19/2011   Anemia 06/27/2011   Acetabulum intrapelvic protrusion into pelvic region or thigh 05/15/2011   Annual physical exam 10/03/2010   Hypertension 03/20/2010   Vitamin D deficiency 08/20/2009   Anxiety 05/18/2009   Migraine headache 05/13/2006    Past Surgical History:  Procedure Laterality Date   ABDOMINAL HYSTERECTOMY     CESAREAN SECTION  08/2005   Primary low transverse   COLONOSCOPY  09/22/2011   AXK:PVVZSMO polyp in the rectum/Internal hemorrhoids, benign path   ESOPHAGOGASTRODUODENOSCOPY  09/22/2011   LMB:EMLJ, esophageal/Sessile polyp in the body of the stomach/Hiatal hernia/ABDOMINAL PAIN LIKELY FUNCTIONAL V. POST-INFECTIOUS IBS, path: mild chronic gastritis   FRACTURE SURGERY     Right hip   JOINT REPLACEMENT     right hip   LAPAROSCOPY  10/2000   Operative laparoscopy with lysis of right adnexal adhesions and uterointestinal adhesions   left tib fib     OTHER SURGICAL HISTORY  02/2009   Closed treatment internal fixation, left tibia with Tamala Julian and Nephew tibial nail locked proximal and distal   TOTAL ABDOMINAL HYSTERECTOMY W/ BILATERAL SALPINGOOPHORECTOMY  08/2007   for endometriosis. With concurrent right salpingo-oophorectomy  (2009), left oophorectomy (1995)   TUBAL LIGATION  08/2005   Right tubal ligation    OB History   No obstetric history on  file.      Home Medications    Prior to Admission medications   Medication Sig Start Date End Date Taking? Authorizing Provider  amLODipine (NORVASC) 10 MG tablet Take 1 tablet (10 mg total) by mouth daily. 02/05/22  Yes McLean-Scocuzza, Nino Glow, MD  cyclobenzaprine (FLEXERIL) 5 MG tablet Take 1 tablet (5 mg total) by mouth daily as needed for muscle spasms. 08/07/21  Yes McLean-Scocuzza, Nino Glow, MD  hydroquinone 4 % cream Apply topically at bedtime. qhs x 3-4 months then 2x per week as needed 12/21/19  Yes McLean-Scocuzza, Nino Glow, MD  Multiple Vitamin (MULTIVITAMIN PO) Take by mouth.   Yes [provider]  nystatin cream (MYCOSTATIN) Apply to affected area 2 times daily 08/28/21  Yes Volney American, PA-C  spironolactone (ALDACTONE) 50 MG tablet Take 1 tablet (  50 mg total) by mouth daily. In am 02/05/22  Yes McLean-Scocuzza, Nino Glow, MD  SUMAtriptan (IMITREX) 50 MG tablet Can take an additional dose 2 hours later if initial dose ineffective. Do not take more than 4 tablets/ day. 02/05/22  Yes McLean-Scocuzza, Nino Glow, MD  tiZANidine (ZANAFLEX) 4 MG tablet Take 1 tablet (4 mg total) by mouth every 6 (six) hours as needed for muscle spasms. Do not drink alcohol or drive while taking this medication.  May cause drowsiness. 02/03/22  Yes Volney American, PA-C  VITAMIN E PO Take by mouth.   Yes [provider]    Family History Family History  Problem Relation Age of Onset   Other Father        deceased while in service.    Breast cancer Sister 62       remission   Cancer Sister        breast   Lupus Cousin    Other Mother        unknown medical history   Dementia Maternal Grandmother        at old age   Parkinson's disease Maternal Grandfather    Multiple sclerosis Other        uncle    Colon cancer Neg Hx    Liver disease Neg Hx    Migraines Neg Hx     Social History Social History   Tobacco Use   Smoking status: Never   Smokeless tobacco:  Never  Vaping Use   Vaping Use: Never used  Substance Use Topics   Alcohol use: No    Alcohol/week: 0.0 standard drinks of alcohol   Drug use: No     Allergies   Pineapple, Eggs or egg-derived products, Hctz [hydrochlorothiazide], Latex, Lisinopril, Morphine and related, and Tape   Review of Systems Review of Systems  Constitutional:  Negative for chills, fatigue and fever.  Respiratory:  Negative for shortness of breath.   Cardiovascular:  Negative for chest pain.  Gastrointestinal:  Positive for abdominal pain. Negative for diarrhea, nausea and vomiting.  Genitourinary:  Positive for flank pain, frequency, pelvic pain and urgency. Negative for decreased urine volume, difficulty urinating, dysuria, hematuria, vaginal bleeding, vaginal discharge and vaginal pain.  Musculoskeletal:  Positive for back pain.  Skin:  Negative for rash.     Physical Exam Triage Vital Signs ED Triage Vitals  Enc Vitals Group     BP      Pulse      Resp      Temp      Temp src      SpO2      Weight      Height      Head Circumference      Peak Flow      Pain Score      Pain Loc      Pain Edu?      Excl. in [place]?    No data found.  Updated Vital Signs BP (!) 153/87 (BP Location: Left Arm)   Pulse 64   Temp 98.5 F (36.9 C) (Oral)   Resp 18   Ht 6' (1.829 m)   Wt 182 lb (82.6 kg)   SpO2 99%   BMI 24.68 kg/m      Physical Exam Vitals and nursing note reviewed.  Constitutional:      General: She is not in acute distress.    Appearance: Normal appearance. She is not ill-appearing or toxic-appearing.  HENT:  Head: Normocephalic and atraumatic.  Eyes:     General: No scleral icterus.       Right eye: No discharge.        Left eye: No discharge.     Conjunctiva/sclera: Conjunctivae normal.  Cardiovascular:     Rate and Rhythm: Normal rate and regular rhythm.     Heart sounds: Normal heart sounds.  Pulmonary:     Effort: Pulmonary effort is normal. No respiratory distress.      Breath sounds: Normal breath sounds.  Abdominal:     Palpations: Abdomen is soft.     Tenderness: There is abdominal tenderness (generalized, lower). There is right CVA tenderness (subjective) and left CVA tenderness (subjective).  Musculoskeletal:     Cervical back: Neck supple.  Skin:    General: Skin is dry.  Neurological:     General: No focal deficit present.     Mental Status: She is alert. Mental status is at baseline.     Motor: No weakness.     Gait: Gait normal.  Psychiatric:        Mood and Affect: Mood normal.        Behavior: Behavior normal.        Thought Content: Thought content normal.      UC Treatments / Results  Labs (all labs ordered are listed, but only abnormal results are displayed) Labs Reviewed  URINE CULTURE  URINALYSIS, ROUTINE W REFLEX MICROSCOPIC    EKG   Radiology No results found.  Procedures Procedures (including critical care time)  Medications Ordered in UC Medications - No data to display  Initial Impression / Assessment and Plan / UC Course  I have reviewed the triage vital signs and the nursing notes.  Pertinent labs & imaging results that were available during my care of the patient were reviewed by me and considered in my medical decision making (see chart for details).  46 year old female presenting for lower abdominal pain and lower back pain with urinary urgency and foul-smelling urine.  Patient believes she has a UTI.  She does have a history of IBS-C, pelvic floor dysfunction and was seen for back pain 1 month ago.  BP elevated at 153/87.  She is afebrile and overall well-appearing.  On exam heart regular rate and rhythm and chest clear auscultation.  Abdomen soft with tenderness to palpation generalized throughout the lower abdomen.  Subjective bilateral CVA tenderness.  Seems to have increased pain when laying flat and when bending forward.  Reduced range of motion of back due to pain and discomfort.  Urinalysis  performed today is within normal limits.  We will send urine for culture just to be sure she does not have a UTI but unlikely based on urinalysis which I discussed with her.  Encouraged her to increase her rest and fluid intake.  Suspect patient's pain likely related to musculoskeletal back pain and also probably related to her IBS-C and pelvic floor dysfunction.  Patient to continue physical therapy and meds as prescribed by GI and follow-up with GI specialist as scheduled.  Advised ibuprofen, Flexeril, Tylenol, following RICE guidelines, heat therapy for back pain.  Reviewed return and ER precautions relating to back and abdominal pain.   Final Clinical Impressions(s) / UC Diagnoses   Final diagnoses:  Acute bilateral low back pain without sciatica  Abdominal pain, lower  Urinary urgency     Discharge Instructions      -Urinalysis is normal so it is unlikely you have a UTI but  I am going to send it for culture and we will call you if you need to start antibiotics. - It seems like your pain is most likely musculoskeletal so I will continue ibuprofen and add Tylenol.  Continue with the Flexeril.  Add ice and heat and stretches. - Make an appointment with your primary care provider but if symptoms worsen, go to ER.  BACK PAIN: Stressed avoiding painful activities . RICE (REST, ICE, COMPRESSION, ELEVATION) guidelines reviewed. May alternate ice and heat. Consider use of muscle rubs, Salonpas patches, etc. Use medications as directed including muscle relaxers if prescribed. Take anti-inflammatory medications as prescribed or OTC NSAIDs/Tylenol.  F/u with PCP in 7-10 days for reexamination, and please feel free to call or return to the urgent care at any time for any questions or concerns you may have and we will be happy to help you!   BACK PAIN RED FLAGS: If the back pain acutely worsens or there are any red flag symptoms such as numbness/tingling, leg weakness, saddle anesthesia, or loss of  bowel/bladder control, go immediately to the ER. Follow up with [place] as scheduled or sooner if the pain does not begin to resolve or if it worsens before the follow up    ABDOMINAL PAIN: You may take Tylenol for pain relief. Use medications as directed including antiemetics and antidiarrheal medications if suggested or prescribed. You should increase fluids and electrolytes as well as rest over these next several days. If you have any questions or concerns, or if your symptoms are not improving or if especially if they acutely worsen, please call or stop back to the clinic immediately and we will be happy to help you or go to the ER   ABDOMINAL PAIN RED FLAGS: Seek immediate further care if: symptoms remain the same or worsen over the next 3-7 days, you are unable to keep fluids down, you see blood or mucus in your stool, you vomit black or dark red material, you have a fever of 101.F or higher, you have localized and/or persistent abdominal pain       ED Prescriptions   None    PDMP not reviewed this encounter.   Danton Clap, PA-C 02/28/22 1947

## 2022-02-28 NOTE — Discharge Instructions (Addendum)
-  Urinalysis is normal so it is unlikely you have a UTI but I am going to send it for culture and we will call you if you need to start antibiotics. - It seems like your pain is most likely musculoskeletal so I will continue ibuprofen and add Tylenol.  Continue with the Flexeril.  Add ice and heat and stretches. - Make an appointment with your primary care provider but if symptoms worsen, go to ER.  BACK PAIN: Stressed avoiding painful activities . RICE (REST, ICE, COMPRESSION, ELEVATION) guidelines reviewed. May alternate ice and heat. Consider use of muscle rubs, Salonpas patches, etc. Use medications as directed including muscle relaxers if prescribed. Take anti-inflammatory medications as prescribed or OTC NSAIDs/Tylenol.  F/u with PCP in 7-10 days for reexamination, and please feel free to call or return to the urgent care at any time for any questions or concerns you may have and we will be happy to help you!   BACK PAIN RED FLAGS: If the back pain acutely worsens or there are any red flag symptoms such as numbness/tingling, leg weakness, saddle anesthesia, or loss of bowel/bladder control, go immediately to the ER. Follow up with [place] as scheduled or sooner if the pain does not begin to resolve or if it worsens before the follow up    ABDOMINAL PAIN: You may take Tylenol for pain relief. Use medications as directed including antiemetics and antidiarrheal medications if suggested or prescribed. You should increase fluids and electrolytes as well as rest over these next several days. If you have any questions or concerns, or if your symptoms are not improving or if especially if they acutely worsen, please call or stop back to the clinic immediately and we will be happy to help you or go to the ER   ABDOMINAL PAIN RED FLAGS: Seek immediate further care if: symptoms remain the same or worsen over the next 3-7 days, you are unable to keep fluids down, you see blood or mucus in your stool, you vomit black  or dark red material, you have a fever of 101.F or higher, you have localized and/or persistent abdominal pain

## 2022-03-02 LAB — URINE CULTURE: Culture: <10000 — AB

## 2022-03-05 ENCOUNTER — Telehealth: Payer: Self-pay | Admitting: Surgical

## 2022-03-05 NOTE — Telephone Encounter (Signed)
Unable to reach patient on temporary number on file. Left message to return call on vmail of spouse; need to schdule N/C consult with one of our providers.

## 2022-03-06 ENCOUNTER — Ambulatory Visit: Payer: BC Managed Care – PPO | Attending: Internal Medicine | Admitting: Physical Therapy

## 2022-03-06 ENCOUNTER — Encounter: Payer: Self-pay | Admitting: Physical Therapy

## 2022-03-06 DIAGNOSIS — R293 Abnormal posture: Secondary | ICD-10-CM | POA: Diagnosis not present

## 2022-03-06 DIAGNOSIS — M25512 Pain in left shoulder: Secondary | ICD-10-CM | POA: Insufficient documentation

## 2022-03-06 DIAGNOSIS — M47812 Spondylosis without myelopathy or radiculopathy, cervical region: Secondary | ICD-10-CM | POA: Insufficient documentation

## 2022-03-06 DIAGNOSIS — G8929 Other chronic pain: Secondary | ICD-10-CM | POA: Insufficient documentation

## 2022-03-06 DIAGNOSIS — M542 Cervicalgia: Secondary | ICD-10-CM | POA: Insufficient documentation

## 2022-03-06 NOTE — Therapy (Signed)
OUTPATIENT PHYSICAL THERAPY CERVICAL EVALUATION   Patient Name: [name] MRN: [id number] DOB:1976-07-25, 46 y.o., female Today's Date: 03/06/2022    Past Medical History:  Diagnosis Date   Abdominal pain, epigastric 12/17/2015   Anxiety    pt denies having dx of anxiety   BCC (basal cell carcinoma of skin)    right foot    Bunion, right foot    Chronic cough    Closed fibular fracture 10/2008   Left - Sustained 2/2 fall down stairs (same fall as tibular fracture). S/P internal fixation.   COVID-19    mild back pain, brain fog 01/2021   Duodenitis    EGD (07/14/2011) showing chronic duodenitis, H.pyloir neg   Elevated liver enzymes    Endometriosis    S/P TAH and salpingo-oophorectomy, right   Epigastric pain    Chronic, thought 2/2 gastritis, EGD (07/14/2011) showing chronic duodenitis, H.pyloir neg // Repeat EGD (09/2011) - esophagealring, sessible polyp in stomach body, hiatal hernia - rec ad probiotic, await bx   Esophageal ring    s/p dilatation during EGD (07/2011) - Dr. 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 With chronic OA of hip   History of migraine headaches    Typical symptoms - bright lights, unilateral (starting behind her left ear), throbbing .   Hot flashes    Hypertension    Internal hemorrhoids    Osteoarthritis of hip    Ovarian cancer ([place]) 1995   Protrusio acetabuli    diagnosed at age 13   Sessile colonic polyp    noted 09/2011 -- > rec colonoscopy in 10 years, 2023   Surgical menopause 08/2007   On HRT. Occuring since 01/09 following TAH and R-salpingo-oophorectomy    Tibial fracture 10/2008   Left - Sustained 2/2 fall down stairs. Patient is S/P closed internal fixation with Smith  Nephew tibial nail locked proximal and distal   Vitamin D deficiency 11/2009   Vit D level 15 in 11/2009   Past Surgical History:  Procedure Laterality Date   ABDOMINAL HYSTERECTOMY     CESAREAN SECTION  08/2005   Primary low transverse   COLONOSCOPY  09/22/2011   BHA:LPFXTKW polyp in the rectum/Internal hemorrhoids, benign path   ESOPHAGOGASTRODUODENOSCOPY  09/22/2011   IOX:BDZH, esophageal/Sessile polyp in the body of the stomach/Hiatal hernia/ABDOMINAL PAIN LIKELY FUNCTIONAL V. POST-INFECTIOUS IBS, path: mild chronic gastritis   FRACTURE SURGERY     Right hip   JOINT REPLACEMENT     right hip   LAPAROSCOPY  10/2000   Operative laparoscopy with lysis of right adnexal adhesions and uterointestinal adhesions   left tib fib     OTHER SURGICAL HISTORY  02/2009   Closed treatment internal fixation, left tibia with Tamala Julian and Nephew tibial nail locked proximal and distal   TOTAL ABDOMINAL HYSTERECTOMY W/ BILATERAL SALPINGOOPHORECTOMY  08/2007   for endometriosis. With concurrent right salpingo-oophorectomy  (2009), left oophorectomy (1995)   TUBAL LIGATION  08/2005   Right tubal ligation   Patient Active Problem List   Diagnosis Date Noted   Lipoma of right thigh 09/19/2021   Pain of left mastoid 08/07/2021   Sinus mucosal thickening 08/07/2021   Cervical arthritis 08/07/2021   Mass of right  thigh 07/08/2021   Chronic neck pain 03/22/2020   Prediabetes 12/23/2019   Melasma 12/21/2019   Irritable bowel syndrome with constipation 12/21/2019   Concussion with no loss of consciousness 08/12/2018   Acute pain of left shoulder 08/12/2018   Low back pain 08/12/2018   Allergic rhinitis 08/05/2018   Arthritis 07/01/2018   DDD (degenerative disc disease), lumbar 05/21/2018   Thoracic spondylosis 05/21/2018   DDD (degenerative disc disease), cervical 01/06/2018   Hidradenitis suppurativa 10/11/2017   Chronic frontal sinusitis 10/11/2017   Fatty liver 10/11/2017   Chronic ethmoidal  sinusitis 09/13/2017   Photophobia of both eyes 09/13/2017   Abnormal liver enzymes 09/13/2017   History of hysterectomy with bilateral oophorectomy 06/24/2017   Estrogen deficiency 06/24/2017   Overweight (BMI 25.0-29.9) 11/17/2016   Abdominal pain 11/04/2016   Headache 12/17/2015   Chronic constipation 10/12/2012   Hot flashes 02/20/2012   Hiatal hernia 02/20/2012   Chronic pain of both hips 02/20/2012   Fatigue 12/19/2011   Anemia 06/27/2011   Acetabulum intrapelvic protrusion into pelvic region or thigh 05/15/2011   Annual physical exam 10/03/2010   Hypertension 03/20/2010   Vitamin D deficiency 08/20/2009   Anxiety 05/18/2009   Migraine headache 05/13/2006    PCP: Orland Mustard MD  REFERRING PROVIDER: Orland Mustard MD  REFERRING DIAG: cervical pain  THERAPY DIAG:  No diagnosis found.  Rationale for Evaluation and Treatment Rehabilitation  ONSET DATE: Cervical pain has been off and on for years unable to give an exact time frame, but has been worse roughly over the past year.   SUBJECTIVE:                                                                                                                                                                                                         SUBJECTIVE STATEMENT: Pt with chronic cervical pain with symptoms worsening over past year  PERTINENT HISTORY:  Pt is a 46 year old female presenting with chronic cervical pain. Rpeorts her pain is from the center of the neck down to each shoulder and upper back. Reports some occasionally n/t in her L hand along her pinky in the middle of the night. Pt reports to evaluation with 10/10 pain unable to localize or give specific descriptors. Lowest pain with rest 8/10. Currently has to have family drive her for the past ~35month due to being unable to turn her head. She reports she enjoyed baking and gardening in the past. Lives with husband and 2 older children. Has 4 steps  to get into her home with  unilateral handrail, and a 2 story home with her bedroom on second floor. Patient reports feeling unbalanced when going up/down stairs. Pt reports pain is aggravated by stair ambulation, any push/pull (vaccuming/sweeping), sleeping, bending/stooping. Can sit ~7mns, standing ~118ms, or walking ~1546m before pain increases. Reports using a SPC most days for years, that she uses for comfort of not losing her balance. Pt is R handed with more pain on L than R usually. Pt denies N/V, B&B changes, unexplained weight fluctuation, saddle paresthesia, fever, night sweats, or unrelenting night pain at this time. No falls in past 6 months.      PAIN:  Are you having pain? Yes: NPRS scale: 10/10 Pain location: bilat shoulders, upper back  Pain description: shooting, stabbing, tension  Aggravating factors: stair amb, pushing/pulling, carrying/lifting, squatting/stooping Relieving factors: rolling pin massage  PRECAUTIONS: Fall  WEIGHT BEARING RESTRICTIONS No  FALLS:  Has patient fallen in last 6 months? No  LIVING ENVIRONMENT: Lives with: lives with their family Lives in: House/apartment Stairs: Yes: Internal: 12 steps; on right going up Has following equipment at home: Single point cane  OCCUPATION: none  PLOF: Requires assistive device for independence  PATIENT GOALS Decrease pain  OBJECTIVE:   DIAGNOSTIC FINDINGS:  Cervical CT 02/05/22: Mild loss of disc height at C3-C4, C5-C6 and C6-C7.  Mild spondylotic disc bulging. Facet degenerative change, relatively  mild, most prominent on the left at C3-C4. No convincing disc herniation.   MRI  IMPRESSION:  1. Multilevel uncovertebral and facet hypertrophy, resulting in  neural foraminal stenosis at multiple levels, worst at left C5-6 and  bilateral C3-4.  2. No spinal canal stenosis.   PATIENT SURVEYS:  FOTO 28/48   COGNITION: Overall cognitive status: Within functional limits for tasks  assessed   SENSATION: WFL  Gait: wide BOS, decreased speed  POSTURE: rounded shoulders, forward head, increased thoracic kyphosis, and anterior pelvic tilt Observation: scapular dyskinesis with overhead mobility  PALPATION: TTP with concordant pain to bilat UT, levator, rhomboid group   Passive Accessory Intervertebral Motion (PAIVM) Pt denies reproduction of back pain with CPA L1-L5 and UPA bilaterally L1-L5. Generally hypomobile throughout   CERVICAL ROM:   Passive ROM A/PROM (deg) eval  Flexion  WNL/WNL  Extension 10d/25% limited  Right lateral flexion 25d25% limited  Left lateral flexion 14/25% limited  Right rotation 44d/25% limited  Left rotation 42d/25% limited   (Blank rows = not tested)  OTHER ROM:  All shoulder AROM WNL with pain at Uts Elbow hypermobility with ext; flex WNL  Thoracolumbar rotation 25% limited with concordant upper trap pain to rotation direction; and L QL/ lumbar paraspinal pain with rotation bilat Scapular mobility WNL with some pain with end range scapular retraction   (Blank rows = not tested)  UPPER EXTREMITY MMT:  MMT Right eval Left eval  Shoulder flexion 4 4  Shoulder extension 5 5  Shoulder abduction 4+ 4+  Shoulder internal rotation 5 5  Shoulder external rotation 4 4  Middle trapezius 4 4  Lower trapezius 4- 4-  Elbow flexion 5 5  Elbow extension 5 5  Grip strength 38# 20#   (Blank rows = not tested)  CERVICAL SPECIAL TESTS:  Spurling's test: Negative and Distraction test: Negative    TODAY'S TREATMENT:  Ther-Ex PT reviewed the following HEP with patient with patient able to demonstrate a set of the following with min cuing for correction needed. PT educated patient on parameters of therex (how/when to inc/decrease intensity, frequency, rep/set range, stretch hold time, and  purpose of therex) with verbalized understanding.   Access Code: [email or web address] - Seated Shoulder Rolls  - 1-3 x daily - 7 x weekly - 12-20 reps -  Seated Thoracic Lumbar Extension with Pectoralis Stretch  - 1-3 x daily - 7 x weekly - 12-20 reps - 1sec hold - Seated Cervical Sidebending Stretch  - 1-3 x daily - 7 x weekly - 30sec hold   PATIENT EDUCATION:  Education details: Patient was educated on diagnosis, anatomy and pathology involved, prognosis, role of PT, and was given an HEP, demonstrating exercise with proper form following verbal and tactile cues, and was given a paper hand out to continue exercise at home. Pt was educated on and agreed to plan of care.  Person educated: Patient Education method: Explanation, Demonstration, Verbal cues, and Handouts Education comprehension: verbalized understanding, returned demonstration, and verbal cues required   HOME EXERCISE PROGRAM: [email or web address]  ASSESSMENT:  CLINICAL IMPRESSION: Patient is a 46 y.o. female who was seen today for physical therapy evaluation and treatment for chronic cervicalgia. Pt with impairments in cervical and thoracic ROM, periscapular and shoulder pain, increased muscle tension, decreased motor control, abnormal posture and gait, and pain. Activity limitations in pushing, pulling, lifting, carrying, reaching, and sleeping; inhibiting participation in ADLs. Would benefit from skilled PT to address above deficits and promote optimal return to PLOF.    OBJECTIVE IMPAIRMENTS Abnormal gait, decreased activity tolerance, decreased balance, decreased coordination, decreased endurance, decreased mobility, difficulty walking, decreased ROM, and decreased strength.   ACTIVITY LIMITATIONS carrying, lifting, standing, squatting, sleeping, stairs, transfers, reach over head, and hygiene/grooming  PARTICIPATION LIMITATIONS: cleaning, laundry, driving, community activity, and yard work  PERSONAL FACTORS Age, Fitness, Past/current experiences, Time since onset of injury/illness/exacerbation, and 3+ comorbidities: Hidradenitis suppurativa; hip deformity bilat, HTN, protrusio  acetabuli are also affecting patient's functional outcome.   REHAB POTENTIAL: Good  CLINICAL DECISION MAKING: Evolving/moderate complexity  EVALUATION COMPLEXITY: Moderate   GOALS: Goals reviewed with patient? Yes  SHORT TERM GOALS: Target date: 04/03/2022   Pt will be independent with HEP in order to improve strength and balance in order to decrease fall risk and improve function at home and work. Baseline: 03/06/22 HEP given  Goal status: INITIAL   LONG TERM GOALS: Target date: 05/01/2022  Patient will increase FOTO score to 48 to demonstrate predicted increase in functional mobility to complete ADLs  Baseline: 03/06/22 28 Goal status: INITIAL  2.  Pt will demonstrate full active cervical ROM in order to drive safely Baseline: R/L 44/42d Goal status: INITIAL  3.  Pt will demonstrate gross shoulder and periscapular strength of at least 4+/5 in order to complete heavy household ADLs Baseline: 03/06/22 R/L shoulder ER 4/4 Flex 4/4 Mid trap/scap retractors 4/4 Y lower trap 4-/4- Goal status: INITIAL  4.  Pt will decrease worst pain as reported on NPRS by at least 3 points in order to demonstrate clinically significant reduction in pain.  Baseline: 03/06/22 10/1 Goal status: INITIAL     PLAN: PT FREQUENCY: 1-2x/week  PT DURATION: 12 weeks  PLANNED INTERVENTIONS: Therapeutic exercises, Therapeutic activity, Neuromuscular re-education, Balance training, Gait training, Patient/Family education, Self Care, Joint mobilization, Joint manipulation, Stair training, Aquatic Therapy, Dry Needling, Electrical stimulation, Cryotherapy, Moist heat, Traction, Ultrasound, and Ionotophoresis '4mg'$ /ml Dexamethasone  PLAN FOR NEXT SESSION: Stair assessment, balance assessment   Durwin Reges, PT 03/06/2022, 9:21 AM

## 2022-03-11 ENCOUNTER — Encounter: Payer: Self-pay | Admitting: Physical Therapy

## 2022-03-11 ENCOUNTER — Ambulatory Visit: Payer: BC Managed Care – PPO | Attending: Internal Medicine | Admitting: Physical Therapy

## 2022-03-11 DIAGNOSIS — R293 Abnormal posture: Secondary | ICD-10-CM | POA: Insufficient documentation

## 2022-03-11 DIAGNOSIS — M542 Cervicalgia: Secondary | ICD-10-CM | POA: Diagnosis not present

## 2022-03-11 NOTE — Therapy (Signed)
OUTPATIENT PHYSICAL THERAPY TREATMENT NOTE   Patient Name: [name] MRN: [id number] DOB:June 12, 1976, 46 y.o., female Today's Date: 03/11/2022  PCP: Orland Mustard MD REFERRING PROVIDER: Orland Mustard MD  END OF SESSION:   PT End of Session - 03/11/22 0855     Visit Number 2    Number of Visits 25    Date for PT Re-Evaluation 05/30/22    Authorization - Visit Number 2    Authorization - Number of Visits 10    Progress Note Due on Visit 10    PT Start Time 0836    PT Stop Time 0914    PT Time Calculation (min) 38 min    Activity Tolerance Patient tolerated treatment well    Behavior During Therapy [facility] for tasks assessed/performed             Past Medical History:  Diagnosis Date   Abdominal pain, epigastric 12/17/2015   Anxiety    pt denies having dx of anxiety   BCC (basal cell carcinoma of skin)    right foot    Bunion, right foot    Chronic cough    Closed fibular fracture 10/2008   Left - Sustained 2/2 fall down stairs (same fall as tibular fracture). S/P internal fixation.   COVID-19    mild back pain, brain fog 01/2021   Duodenitis    EGD (07/14/2011) showing chronic duodenitis, H.pyloir neg   Elevated liver enzymes    Endometriosis    S/P TAH and salpingo-oophorectomy, right   Epigastric pain    Chronic, thought 2/2 gastritis, EGD (07/14/2011) showing chronic duodenitis, H.pyloir neg // Repeat EGD (09/2011) - esophagealring, sessible polyp in stomach body, hiatal hernia - rec ad probiotic, await bx   Esophageal ring    s/p dilatation during EGD (07/2011) - Dr. 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 With chronic OA of hip   History of migraine headaches    Typical symptoms -  bright lights, unilateral (starting behind her left ear), throbbing .   Hot flashes    Hypertension    Internal hemorrhoids    Osteoarthritis of hip    Ovarian cancer ([place]) 1995   Protrusio acetabuli    diagnosed at age 79   Sessile colonic polyp    noted 09/2011 -- > rec colonoscopy in 10 years, 2023   Surgical menopause 08/2007   On HRT. Occuring since 01/09 following TAH and R-salpingo-oophorectomy    Tibial fracture 10/2008   Left - Sustained 2/2 fall down stairs. Patient is S/P closed internal fixation with Smith Nephew tibial nail locked proximal and distal   Vitamin D deficiency 11/2009   Vit D level 15 in 11/2009   Past Surgical History:  Procedure Laterality Date   ABDOMINAL HYSTERECTOMY     CESAREAN SECTION  08/2005   Primary low transverse   COLONOSCOPY  09/22/2011   AOZ:HYQMVHQ polyp in the rectum/Internal hemorrhoids, benign path   ESOPHAGOGASTRODUODENOSCOPY  09/22/2011   ION:GEXB, esophageal/Sessile polyp in the body of the stomach/Hiatal hernia/ABDOMINAL PAIN LIKELY FUNCTIONAL V. POST-INFECTIOUS IBS, path: mild chronic gastritis   FRACTURE SURGERY     Right hip   JOINT REPLACEMENT     right hip   LAPAROSCOPY  10/2000   Operative laparoscopy with lysis of right adnexal adhesions and uterointestinal adhesions   left tib fib     OTHER SURGICAL HISTORY  02/2009   Closed treatment internal fixation, left tibia with Tamala Julian and Nephew tibial nail locked proximal and distal   TOTAL ABDOMINAL HYSTERECTOMY W/ BILATERAL SALPINGOOPHORECTOMY  08/2007   for endometriosis. With concurrent right salpingo-oophorectomy  (2009), left oophorectomy (1995)   TUBAL LIGATION  08/2005   Right tubal ligation   Patient Active Problem List   Diagnosis Date Noted   Lipoma of right thigh 09/19/2021   Pain of left mastoid 08/07/2021   Sinus mucosal thickening 08/07/2021   Cervical arthritis 08/07/2021   Mass of right thigh 07/08/2021   Chronic neck pain 03/22/2020   Prediabetes 12/23/2019    Melasma 12/21/2019   Irritable bowel syndrome with constipation 12/21/2019   Concussion with no loss of consciousness 08/12/2018   Acute pain of left shoulder 08/12/2018   Low back pain 08/12/2018   Allergic rhinitis 08/05/2018   Arthritis 07/01/2018   DDD (degenerative disc disease), lumbar 05/21/2018   Thoracic spondylosis 05/21/2018   DDD (degenerative disc disease), cervical 01/06/2018   Hidradenitis suppurativa 10/11/2017   Chronic frontal sinusitis 10/11/2017   Fatty liver 10/11/2017   Chronic ethmoidal sinusitis 09/13/2017   Photophobia of both eyes 09/13/2017   Abnormal liver enzymes 09/13/2017   History of hysterectomy with bilateral oophorectomy 06/24/2017   Estrogen deficiency 06/24/2017   Overweight (BMI 25.0-29.9) 11/17/2016   Abdominal pain 11/04/2016   Headache 12/17/2015   Chronic constipation 10/12/2012   Hot flashes 02/20/2012   Hiatal hernia 02/20/2012   Chronic pain of both hips 02/20/2012   Fatigue 12/19/2011   Anemia 06/27/2011   Acetabulum intrapelvic protrusion into pelvic region or thigh 05/15/2011   Annual physical exam 10/03/2010   Hypertension 03/20/2010   Vitamin D deficiency 08/20/2009   Anxiety 05/18/2009   Migraine headache 05/13/2006    REFERRING DIAG: cervical and thoracic pain  THERAPY DIAG:  Cervicalgia  Abnormal posture  Rationale for Evaluation and Treatment Rehabilitation  PERTINENT HISTORY: Pt is a 46 year old female presenting with chronic cervical pain. Rpeorts her pain is from the center of the neck down to each shoulder and upper back. Reports some occasionally n/t in her L hand along her pinky in the middle of the night. Pt reports to evaluation with 10/10 pain unable to localize or give specific descriptors. Lowest pain with rest 8/10. Currently has to have family drive her for the past ~38month due to being unable to turn her head. She reports she enjoyed baking and gardening in the past. Lives with husband and 2 older  children. Has 4 steps to get into her home with unilateral handrail, and a 2 story home with her bedroom on second floor. Patient reports feeling unbalanced when going up/down stairs. Pt reports pain is aggravated by stair ambulation, any push/pull (vaccuming/sweeping), sleeping, bending/stooping. Can sit ~527ms, standing ~1550m, or walking ~68m82mbefore pain increases. Reports using a SPC most days for years, that she uses for comfort of not losing her balance. Pt is R handed with more pain on L than R usually. Pt denies N/V, B&B changes, unexplained weight fluctuation, saddle paresthesia, fever, night sweats, or unrelenting night pain at this time. No falls in past 6 months.   PRECAUTIONS: none  SUBJECTIVE: Pt reports her back was better following her evaluation until yesterday evening when it returned. Her neck is doing a little bit better, thinks her  mobility is better. Reports 8/10 neck and mid back pain this am. Completing HEP and thinks this has been helpful  PAIN:  Are you having pain? Yes: NPRS scale: 8/10 Pain location: neck and upper back   OBJECTIVE: (objective measures completed at initial evaluation unless otherwise dated)   DIAGNOSTIC FINDINGS:  Cervical CT 02/05/22: Mild loss of disc height at C3-C4, C5-C6 and C6-C7.  Mild spondylotic disc bulging. Facet degenerative change, relatively  mild, most prominent on the left at C3-C4. No convincing disc herniation.    MRI  IMPRESSION:  1. Multilevel uncovertebral and facet hypertrophy, resulting in  neural foraminal stenosis at multiple levels, worst at left C5-6 and  bilateral C3-4.  2. No spinal canal stenosis.    PATIENT SURVEYS:  FOTO 28/48     COGNITION: Overall cognitive status: Within functional limits for tasks assessed     SENSATION: WFL   Gait: wide BOS, decreased speed   POSTURE: rounded shoulders, forward head, increased thoracic kyphosis, and anterior pelvic tilt Observation: scapular dyskinesis with  overhead mobility   PALPATION: TTP with concordant pain to bilat UT, levator, rhomboid group             Passive Accessory Intervertebral Motion (PAIVM) Pt denies reproduction of back pain with CPA L1-L5 and UPA bilaterally L1-L5. Generally hypomobile throughout     CERVICAL ROM:    Passive ROM A/PROM (deg) eval  Flexion  WNL/WNL  Extension 10d/25% limited  Right lateral flexion 25d25% limited  Left lateral flexion 14/25% limited  Right rotation 44d/25% limited  Left rotation 42d/25% limited   (Blank rows = not tested)   OTHER ROM:   All shoulder AROM WNL with pain at Uts Elbow hypermobility with ext; flex WNL  Thoracolumbar rotation 25% limited with concordant upper trap pain to rotation direction; and L QL/ lumbar paraspinal pain with rotation bilat Scapular mobility WNL with some pain with end range scapular retraction    (Blank rows = not tested)   UPPER EXTREMITY MMT:   MMT Right eval Left eval  Shoulder flexion 4 4  Shoulder extension 5 5  Shoulder abduction 4+ 4+  Shoulder internal rotation 5 5  Shoulder external rotation 4 4  Middle trapezius 4 4  Lower trapezius 4- 4-  Elbow flexion 5 5  Elbow extension 5 5  Grip strength 38# 20#   (Blank rows = not tested)   CERVICAL SPECIAL TESTS:  Spurling's test: Negative and Distraction test: Negative       TODAY'S TREATMENT:  Ther-Ex Nustep seat 12 UE 14 L2 for gentle rotation and strengthening  Seated Shoulder Rolls  x15 Cat/cow x12 with good carry over of cuing for breath control with good carry over Prone alt supermans 2x12 with good carry over of initial cuing Good morning with dowel at shoulders 2x 12 with good carry over of initial demo  Articulating bridge 2x 8 with good carry over of demo  Seated forward fold x30sec  Seated Thoracic Lumbar Extension with Pectoralis Stretch over towel x12 with min cuing for breath control  UT stretch 30sec each side     PATIENT EDUCATION:  Education details:  Patient was educated on diagnosis, anatomy and pathology involved, prognosis, role of PT, and was given an HEP, demonstrating exercise with proper form following verbal and tactile cues, and was given a paper hand out to continue exercise at home. Pt was educated on and agreed to plan of care.   Person educated: Patient Education method: Education officer, environmental, Verbal  cues, and Handouts Education comprehension: verbalized understanding, returned demonstration, and verbal cues required     HOME EXERCISE PROGRAM: [id number]   ASSESSMENT:   CLINICAL IMPRESSION: PT initiated therex for increased cervical and thoracic mobility with success. Patient is able to comply with all cuing for proper technique of therex with excellent motivation throughout session. Patient is able to demonstrate HEP with minimal corrections needed. Following session pt reports decreased pain to 6/10 (8/10 at start of session). PT will continue progression as able.        OBJECTIVE IMPAIRMENTS Abnormal gait, decreased activity tolerance, decreased balance, decreased coordination, decreased endurance, decreased mobility, difficulty walking, decreased ROM, and decreased strength.    ACTIVITY LIMITATIONS carrying, lifting, standing, squatting, sleeping, stairs, transfers, reach over head, and hygiene/grooming   PARTICIPATION LIMITATIONS: cleaning, laundry, driving, community activity, and yard work   PERSONAL FACTORS Age, Fitness, Past/current experiences, Time since onset of injury/illness/exacerbation, and 3+ comorbidities: Hidradenitis suppurativa; hip deformity bilat, HTN, protrusio acetabuli are also affecting patient's functional outcome.    REHAB POTENTIAL: Good   CLINICAL DECISION MAKING: Evolving/moderate complexity   EVALUATION COMPLEXITY: Moderate     GOALS: Goals reviewed with patient? Yes   SHORT TERM GOALS: Target date: 04/03/2022    Pt will be independent with HEP in order to improve strength and  balance in order to decrease fall risk and improve function at home and work. Baseline: 03/06/22 HEP given  Goal status: INITIAL     LONG TERM GOALS: Target date: 05/01/2022   Patient will increase FOTO score to 48 to demonstrate predicted increase in functional mobility to complete ADLs   Baseline: 03/06/22 28 Goal status: INITIAL   2.  Pt will demonstrate full active cervical ROM in order to drive safely Baseline: R/L 44/42d Goal status: INITIAL   3.  Pt will demonstrate gross shoulder and periscapular strength of at least 4+/5 in order to complete heavy household ADLs Baseline: 03/06/22 R/L shoulder ER 4/4 Flex 4/4 Mid trap/scap retractors 4/4 Y lower trap 4-/4- Goal status: INITIAL   4.  Pt will decrease worst pain as reported on NPRS by at least 3 points in order to demonstrate clinically significant reduction in pain.   Baseline: 03/06/22 10/1 Goal status: INITIAL         PLAN: PT FREQUENCY: 1-2x/week   PT DURATION: 12 weeks   PLANNED INTERVENTIONS: Therapeutic exercises, Therapeutic activity, Neuromuscular re-education, Balance training, Gait training, Patient/Family education, Self Care, Joint mobilization, Joint manipulation, Stair training, Aquatic Therapy, Dry Needling, Electrical stimulation, Cryotherapy, Moist heat, Traction, Ultrasound, and Ionotophoresis '4mg'$ /ml Dexamethasone   PLAN FOR NEXT SESSION: Stair assessment, balance assessment   Durwin Reges DPT  Durwin Reges, PT 03/11/2022, 9:14 AM

## 2022-03-14 ENCOUNTER — Ambulatory Visit: Payer: BC Managed Care – PPO | Admitting: Physical Therapy

## 2022-03-14 ENCOUNTER — Encounter: Payer: Self-pay | Admitting: Physical Therapy

## 2022-03-14 DIAGNOSIS — R293 Abnormal posture: Secondary | ICD-10-CM

## 2022-03-14 DIAGNOSIS — M542 Cervicalgia: Secondary | ICD-10-CM | POA: Diagnosis not present

## 2022-03-14 NOTE — Therapy (Signed)
OUTPATIENT PHYSICAL THERAPY TREATMENT NOTE   Patient Name: [name] MRN: [id number] DOB:[birth year], 46 y.o., female Today's Date: 03/11/2022  PCP: Orland Mustard MD REFERRING PROVIDER: Orland Mustard MD  END OF SESSION:   PT End of Session - 03/11/22 0855     Visit Number 2    Number of Visits 25    Date for PT Re-Evaluation 05/30/22    Authorization - Visit Number 2    Authorization - Number of Visits 10    Progress Note Due on Visit 10    PT Start Time 0836    PT Stop Time 0914    PT Time Calculation (min) 38 min    Activity Tolerance Patient tolerated treatment well    Behavior During Therapy [facility] for tasks assessed/performed             Past Medical History:  Diagnosis Date   Abdominal pain, epigastric 12/17/2015   Anxiety    pt denies having dx of anxiety   BCC (basal cell carcinoma of skin)    right foot    Bunion, right foot    Chronic cough    Closed fibular fracture 10/2008   Left - Sustained 2/2 fall down stairs (same fall as tibular fracture). S/P internal fixation.   COVID-19    mild back pain, brain fog 01/2021   Duodenitis    EGD (07/14/2011) showing chronic duodenitis, H.pyloir neg   Elevated liver enzymes    Endometriosis    S/P TAH and salpingo-oophorectomy, right   Epigastric pain    Chronic, thought 2/2 gastritis, EGD (07/14/2011) showing chronic duodenitis, H.pyloir neg // Repeat EGD (09/2011) - esophagealring, sessible polyp in stomach body, hiatal hernia - rec ad probiotic, await bx   Esophageal ring    s/p dilatation during EGD (07/2011) - Dr. 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 With chronic OA of hip   History of migraine headaches    Typical symptoms -  bright lights, unilateral (starting behind her left ear), throbbing .   Hot flashes    Hypertension    Internal hemorrhoids    Osteoarthritis of hip    Ovarian cancer ([place]) 1995   Protrusio acetabuli    diagnosed at age 43   Sessile colonic polyp    noted 09/2011 -- > rec colonoscopy in 10 years, 2023   Surgical menopause 08/2007   On HRT. Occuring since 01/09 following TAH and R-salpingo-oophorectomy    Tibial fracture 10/2008   Left - Sustained 2/2 fall down stairs. Patient is S/P closed internal fixation with Smith Nephew tibial nail locked proximal and distal   Vitamin D deficiency 11/2009   Vit D level 15 in 11/2009   Past Surgical History:  Procedure Laterality Date   ABDOMINAL HYSTERECTOMY     CESAREAN SECTION  08/2005   Primary low transverse   COLONOSCOPY  09/22/2011   KGY:JEHUDJS polyp in the rectum/Internal hemorrhoids, benign path   ESOPHAGOGASTRODUODENOSCOPY  09/22/2011   HFW:YOVZ, esophageal/Sessile polyp in the body of the stomach/Hiatal hernia/ABDOMINAL PAIN LIKELY FUNCTIONAL V. POST-INFECTIOUS IBS, path: mild chronic gastritis   FRACTURE SURGERY     Right hip   JOINT REPLACEMENT     right hip   LAPAROSCOPY  10/2000   Operative laparoscopy with lysis of right adnexal adhesions and uterointestinal adhesions   left tib fib     OTHER SURGICAL HISTORY  02/2009   Closed treatment internal fixation, left tibia with Tamala Julian and Nephew tibial nail locked proximal and distal   TOTAL ABDOMINAL HYSTERECTOMY W/ BILATERAL SALPINGOOPHORECTOMY  08/2007   for endometriosis. With concurrent right salpingo-oophorectomy  (2009), left oophorectomy (1995)   TUBAL LIGATION  08/2005   Right tubal ligation   Patient Active Problem List   Diagnosis Date Noted   Lipoma of right thigh 09/19/2021   Pain of left mastoid 08/07/2021   Sinus mucosal thickening 08/07/2021   Cervical arthritis 08/07/2021   Mass of right thigh 07/08/2021   Chronic neck pain 03/22/2020   Prediabetes 12/23/2019    Melasma 12/21/2019   Irritable bowel syndrome with constipation 12/21/2019   Concussion with no loss of consciousness 08/12/2018   Acute pain of left shoulder 08/12/2018   Low back pain 08/12/2018   Allergic rhinitis 08/05/2018   Arthritis 07/01/2018   DDD (degenerative disc disease), lumbar 05/21/2018   Thoracic spondylosis 05/21/2018   DDD (degenerative disc disease), cervical 01/06/2018   Hidradenitis suppurativa 10/11/2017   Chronic frontal sinusitis 10/11/2017   Fatty liver 10/11/2017   Chronic ethmoidal sinusitis 09/13/2017   Photophobia of both eyes 09/13/2017   Abnormal liver enzymes 09/13/2017   History of hysterectomy with bilateral oophorectomy 06/24/2017   Estrogen deficiency 06/24/2017   Overweight (BMI 25.0-29.9) 11/17/2016   Abdominal pain 11/04/2016   Headache 12/17/2015   Chronic constipation 10/12/2012   Hot flashes 02/20/2012   Hiatal hernia 02/20/2012   Chronic pain of both hips 02/20/2012   Fatigue 12/19/2011   Anemia 06/27/2011   Acetabulum intrapelvic protrusion into pelvic region or thigh 05/15/2011   Annual physical exam 10/03/2010   Hypertension 03/20/2010   Vitamin D deficiency 08/20/2009   Anxiety 05/18/2009   Migraine headache 05/13/2006    REFERRING DIAG: cervical and thoracic pain  THERAPY DIAG:  Cervicalgia  Abnormal posture  Rationale for Evaluation and Treatment Rehabilitation  PERTINENT HISTORY: Pt is a 47 year old female presenting with chronic cervical pain. Rpeorts her pain is from the center of the neck down to each shoulder and upper back. Reports some occasionally n/t in her L hand along her pinky in the middle of the night. Pt reports to evaluation with 10/10 pain unable to localize or give specific descriptors. Lowest pain with rest 8/10. Currently has to have family drive her for the past ~6month due to being unable to turn her head. She reports she enjoyed baking and gardening in the past. Lives with husband and 2 older  children. Has 4 steps to get into her home with unilateral handrail, and a 2 story home with her bedroom on second floor. Patient reports feeling unbalanced when going up/down stairs. Pt reports pain is aggravated by stair ambulation, any push/pull (vaccuming/sweeping), sleeping, bending/stooping. Can sit ~579ms, standing ~1570m, or walking ~45m62mbefore pain increases. Reports using a SPC most days for years, that she uses for comfort of not losing her balance. Pt is R handed with more pain on L than R usually. Pt denies N/V, B&B changes, unexplained weight fluctuation, saddle paresthesia, fever, night sweats, or unrelenting night pain at this time. No falls in past 6 months.   PRECAUTIONS: none  SUBJECTIVE: Pt reports increased pain to 8.5/10 in her hips and low back this am due to the rain. She reports her neck is doing better,  5/10 pain this morning. Feels just "stiff" all over. Doing HEP without question or concern.   PAIN:  Are you having pain? Yes: NPRS scale: 8/10 Pain location: neck and upper back   OBJECTIVE: (objective measures completed at initial evaluation unless otherwise dated)   DIAGNOSTIC FINDINGS:  Cervical CT 02/05/22: Mild loss of disc height at C3-C4, C5-C6 and C6-C7.  Mild spondylotic disc bulging. Facet degenerative change, relatively  mild, most prominent on the left at C3-C4. No convincing disc herniation.    MRI  IMPRESSION:  1. Multilevel uncovertebral and facet hypertrophy, resulting in  neural foraminal stenosis at multiple levels, worst at left C5-6 and  bilateral C3-4.  2. No spinal canal stenosis.    PATIENT SURVEYS:  FOTO 28/48     COGNITION: Overall cognitive status: Within functional limits for tasks assessed     SENSATION: WFL   Gait: wide BOS, decreased speed   POSTURE: rounded shoulders, forward head, increased thoracic kyphosis, and anterior pelvic tilt Observation: scapular dyskinesis with overhead mobility   PALPATION: TTP with  concordant pain to bilat UT, levator, rhomboid group             Passive Accessory Intervertebral Motion (PAIVM) Pt denies reproduction of back pain with CPA L1-L5 and UPA bilaterally L1-L5. Generally hypomobile throughout     CERVICAL ROM:    Passive ROM A/PROM (deg) eval  Flexion  WNL/WNL  Extension 10d/25% limited  Right lateral flexion 25d25% limited  Left lateral flexion 14/25% limited  Right rotation 44d/25% limited  Left rotation 42d/25% limited   (Blank rows = not tested)   OTHER ROM:   All shoulder AROM WNL with pain at Uts Elbow hypermobility with ext; flex WNL  Thoracolumbar rotation 25% limited with concordant upper trap pain to rotation direction; and L QL/ lumbar paraspinal pain with rotation bilat Scapular mobility WNL with some pain with end range scapular retraction    (Blank rows = not tested)   UPPER EXTREMITY MMT:   MMT Right eval Left eval  Shoulder flexion 4 4  Shoulder extension 5 5  Shoulder abduction 4+ 4+  Shoulder internal rotation 5 5  Shoulder external rotation 4 4  Middle trapezius 4 4  Lower trapezius 4- 4-  Elbow flexion 5 5  Elbow extension 5 5  Grip strength 38# 20#   (Blank rows = not tested)   CERVICAL SPECIAL TESTS:  Spurling's test: Negative and Distraction test: Negative       TODAY'S TREATMENT:  Ther-Ex Nustep seat 12 UE 14 L2 for gentle rotation and strengthening  Thoracolumbar rotation with pink theraball overhead x12 with good carry over of initial cuing Fwd theraball rollouts x12 with cuing for breath control Good morning with dowel at shoulders 2x 12 with good carry over of initial demo  Seated on theraball TA marches 2x12 (6 each LE) with max cuing for initial set up and posture, min TC to prevent LOB- initially hip flexor > core activation, better with second set 5# DB wt'd twist on theraball 2x 12 with good carry over following initial demo and max cuing Seated Shoulder Rolls  x15 Thoracic ext seated with towel  roll; hands behind head x12 min cuing for breath control UT Stretch at/cow x12 with good carry over of cuing for breath control with good carry over      PATIENT EDUCATION:  Education details: Patient was educated on diagnosis, anatomy and pathology involved, prognosis, role of PT, and was given an HEP, demonstrating exercise with  proper form following verbal and tactile cues, and was given a paper hand out to continue exercise at home. Pt was educated on and agreed to plan of care.   Person educated: Patient Education method: Explanation, Demonstration, Verbal cues, and Handouts Education comprehension: verbalized understanding, returned demonstration, and verbal cues required     HOME EXERCISE PROGRAM: [id number]   ASSESSMENT:   CLINICAL IMPRESSION: PT continued therex for increased cervical and thoracic mobility and core stabilization/strength with success. Patient is able to comply with all cuing for proper technique of therex with excellent motivation throughout session. Pt with difficulty with core > hip flexor activation, able to correct this. Following session pt reports decreased pain to 7/10 (8.5/10 at start of session). PT will continue progression as able.        OBJECTIVE IMPAIRMENTS Abnormal gait, decreased activity tolerance, decreased balance, decreased coordination, decreased endurance, decreased mobility, difficulty walking, decreased ROM, and decreased strength.    ACTIVITY LIMITATIONS carrying, lifting, standing, squatting, sleeping, stairs, transfers, reach over head, and hygiene/grooming   PARTICIPATION LIMITATIONS: cleaning, laundry, driving, community activity, and yard work   PERSONAL FACTORS Age, Fitness, Past/current experiences, Time since onset of injury/illness/exacerbation, and 3+ comorbidities: Hidradenitis suppurativa; hip deformity bilat, HTN, protrusio acetabuli are also affecting patient's functional outcome.    REHAB POTENTIAL: Good   CLINICAL  DECISION MAKING: Evolving/moderate complexity   EVALUATION COMPLEXITY: Moderate     GOALS: Goals reviewed with patient? Yes   SHORT TERM GOALS: Target date: 04/03/2022    Pt will be independent with HEP in order to improve strength and balance in order to decrease fall risk and improve function at home and work. Baseline: 03/06/22 HEP given  Goal status: INITIAL     LONG TERM GOALS: Target date: 05/01/2022   Patient will increase FOTO score to 48 to demonstrate predicted increase in functional mobility to complete ADLs   Baseline: 03/06/22 28 Goal status: INITIAL   2.  Pt will demonstrate full active cervical ROM in order to drive safely Baseline: R/L 44/42d Goal status: INITIAL   3.  Pt will demonstrate gross shoulder and periscapular strength of at least 4+/5 in order to complete heavy household ADLs Baseline: 03/06/22 R/L shoulder ER 4/4 Flex 4/4 Mid trap/scap retractors 4/4 Y lower trap 4-/4- Goal status: INITIAL   4.  Pt will decrease worst pain as reported on NPRS by at least 3 points in order to demonstrate clinically significant reduction in pain.   Baseline: 03/06/22 10/1 Goal status: INITIAL         PLAN: PT FREQUENCY: 1-2x/week   PT DURATION: 12 weeks   PLANNED INTERVENTIONS: Therapeutic exercises, Therapeutic activity, Neuromuscular re-education, Balance training, Gait training, Patient/Family education, Self Care, Joint mobilization, Joint manipulation, Stair training, Aquatic Therapy, Dry Needling, Electrical stimulation, Cryotherapy, Moist heat, Traction, Ultrasound, and Ionotophoresis '4mg'$ /ml Dexamethasone   PLAN FOR NEXT SESSION: Stair assessment, balance assessment   Durwin Reges DPT  Durwin Reges, PT 03/11/2022, 9:14 AM

## 2022-03-19 ENCOUNTER — Ambulatory Visit: Payer: BC Managed Care – PPO | Admitting: Physical Therapy

## 2022-03-21 ENCOUNTER — Ambulatory Visit: Payer: BC Managed Care – PPO | Admitting: Physical Therapy

## 2022-03-26 ENCOUNTER — Encounter: Payer: BC Managed Care – PPO | Admitting: Physical Therapy

## 2022-03-28 ENCOUNTER — Ambulatory Visit: Payer: BC Managed Care – PPO | Admitting: Physical Therapy

## 2022-04-09 ENCOUNTER — Ambulatory Visit: Payer: BC Managed Care – PPO | Admitting: Physical Therapy

## 2022-04-09 DIAGNOSIS — R293 Abnormal posture: Secondary | ICD-10-CM

## 2022-04-09 DIAGNOSIS — M542 Cervicalgia: Secondary | ICD-10-CM | POA: Diagnosis not present

## 2022-04-09 NOTE — Therapy (Signed)
OUTPATIENT PHYSICAL THERAPY TREATMENT NOTE/DC Summary Reporting Period 03/06/22 - 04/09/22   Patient Name: [name] MRN: [id number] DOB:1975/11/14, 46 y.o., female Today's Date: 03/11/2022  PCP: Orland Mustard MD REFERRING PROVIDER: Orland Mustard MD  END OF SESSION:   PT End of Session - 03/11/22 0855     Visit Number 4    Number of Visits 25    Date for PT Re-Evaluation 05/30/22    Authorization - Visit Number 2    Authorization - Number of Visits 10    Progress Note Due on Visit 10    PT Start Time 0836    PT Stop Time 0914    PT Time Calculation (min) 38 min    Activity Tolerance Patient tolerated treatment well    Behavior During Therapy [facility] for tasks assessed/performed             Past Medical History:  Diagnosis Date   Abdominal pain, epigastric 12/17/2015   Anxiety    pt denies having dx of anxiety   BCC (basal cell carcinoma of skin)    right foot    Bunion, right foot    Chronic cough    Closed fibular fracture 10/2008   Left - Sustained 2/2 fall down stairs (same fall as tibular fracture). S/P internal fixation.   COVID-19    mild back pain, brain fog 01/2021   Duodenitis    EGD (07/14/2011) showing chronic duodenitis, H.pyloir neg   Elevated liver enzymes    Endometriosis    S/P TAH and salpingo-oophorectomy, right   Epigastric pain    Chronic, thought 2/2 gastritis, EGD (07/14/2011) showing chronic duodenitis, H.pyloir neg // Repeat EGD (09/2011) - esophagealring, sessible polyp in stomach body, hiatal hernia - rec ad probiotic, await bx   Esophageal ring    s/p dilatation during EGD (07/2011) - Dr. 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 With chronic OA of hip   History of  migraine headaches    Typical symptoms - bright lights, unilateral (starting behind her left ear), throbbing .   Hot flashes    Hypertension    Internal hemorrhoids    Osteoarthritis of hip    Ovarian cancer ([place]) 1995   Protrusio acetabuli    diagnosed at age 33   Sessile colonic polyp    noted 09/2011 -- > rec colonoscopy in 10 years, 2023   Surgical menopause 08/2007   On HRT. Occuring since 01/09 following TAH and R-salpingo-oophorectomy    Tibial fracture 10/2008   Left - Sustained 2/2 fall down stairs. Patient is S/P closed internal fixation with Smith Nephew tibial nail locked proximal and distal   Vitamin D deficiency 11/2009   Vit D level 15 in 11/2009   Past Surgical History:  Procedure Laterality Date   ABDOMINAL HYSTERECTOMY     CESAREAN SECTION  08/2005   Primary low transverse   COLONOSCOPY  09/22/2011   RSW:NIOEVOJ polyp in the rectum/Internal hemorrhoids, benign path   ESOPHAGOGASTRODUODENOSCOPY  09/22/2011   JKK:XFGH, esophageal/Sessile polyp in the body of the stomach/Hiatal hernia/ABDOMINAL PAIN LIKELY FUNCTIONAL V. POST-INFECTIOUS IBS, path: mild chronic gastritis   FRACTURE SURGERY     Right hip   JOINT REPLACEMENT  right hip   LAPAROSCOPY  10/2000   Operative laparoscopy with lysis of right adnexal adhesions and uterointestinal adhesions   left tib fib     OTHER SURGICAL HISTORY  02/2009   Closed treatment internal fixation, left tibia with Tamala Julian and Nephew tibial nail locked proximal and distal   TOTAL ABDOMINAL HYSTERECTOMY W/ BILATERAL SALPINGOOPHORECTOMY  08/2007   for endometriosis. With concurrent right salpingo-oophorectomy  (2009), left oophorectomy (1995)   TUBAL LIGATION  08/2005   Right tubal ligation   Patient Active Problem List   Diagnosis Date Noted   Lipoma of right thigh 09/19/2021   Pain of left mastoid 08/07/2021   Sinus mucosal thickening 08/07/2021   Cervical arthritis 08/07/2021   Mass of right thigh 07/08/2021   Chronic neck  pain 03/22/2020   Prediabetes 12/23/2019   Melasma 12/21/2019   Irritable bowel syndrome with constipation 12/21/2019   Concussion with no loss of consciousness 08/12/2018   Acute pain of left shoulder 08/12/2018   Low back pain 08/12/2018   Allergic rhinitis 08/05/2018   Arthritis 07/01/2018   DDD (degenerative disc disease), lumbar 05/21/2018   Thoracic spondylosis 05/21/2018   DDD (degenerative disc disease), cervical 01/06/2018   Hidradenitis suppurativa 10/11/2017   Chronic frontal sinusitis 10/11/2017   Fatty liver 10/11/2017   Chronic ethmoidal sinusitis 09/13/2017   Photophobia of both eyes 09/13/2017   Abnormal liver enzymes 09/13/2017   History of hysterectomy with bilateral oophorectomy 06/24/2017   Estrogen deficiency 06/24/2017   Overweight (BMI 25.0-29.9) 11/17/2016   Abdominal pain 11/04/2016   Headache 12/17/2015   Chronic constipation 10/12/2012   Hot flashes 02/20/2012   Hiatal hernia 02/20/2012   Chronic pain of both hips 02/20/2012   Fatigue 12/19/2011   Anemia 06/27/2011   Acetabulum intrapelvic protrusion into pelvic region or thigh 05/15/2011   Annual physical exam 10/03/2010   Hypertension 03/20/2010   Vitamin D deficiency 08/20/2009   Anxiety 05/18/2009   Migraine headache 05/13/2006    REFERRING DIAG: cervical and thoracic pain  THERAPY DIAG:  Cervicalgia  Abnormal posture  Rationale for Evaluation and Treatment Rehabilitation  PERTINENT HISTORY: Pt is a 46 year old female presenting with chronic cervical pain. Rpeorts her pain is from the center of the neck down to each shoulder and upper back. Reports some occasionally n/t in her L hand along her pinky in the middle of the night. Pt reports to evaluation with 10/10 pain unable to localize or give specific descriptors. Lowest pain with rest 8/10. Currently has to have family drive her for the past ~79month due to being unable to turn her head. She reports she enjoyed baking and gardening in the  past. Lives with husband and 2 older children. Has 4 steps to get into her home with unilateral handrail, and a 2 story home with her bedroom on second floor. Patient reports feeling unbalanced when going up/down stairs. Pt reports pain is aggravated by stair ambulation, any push/pull (vaccuming/sweeping), sleeping, bending/stooping. Can sit ~556ms, standing ~1526m, or walking ~81m72mbefore pain increases. Reports using a SPC most days for years, that she uses for comfort of not losing her balance. Pt is R handed with more pain on L than R usually. Pt denies N/V, B&B changes, unexplained weight fluctuation, saddle paresthesia, fever, night sweats, or unrelenting night pain at this time. No falls in past 6 months.   PRECAUTIONS: none  SUBJECTIVE: Pt reports she has been doing well over all. Has been ompleting HEP and having much less pain overall.  PAIN:  Are you having pain? Yes: NPRS scale: 8/10 Pain location: neck and upper back   OBJECTIVE: (objective measures completed at initial evaluation unless otherwise dated)   DIAGNOSTIC FINDINGS:  Cervical CT 02/05/22: Mild loss of disc height at C3-C4, C5-C6 and C6-C7.  Mild spondylotic disc bulging. Facet degenerative change, relatively  mild, most prominent on the left at C3-C4. No convincing disc herniation.    MRI  IMPRESSION:  1. Multilevel uncovertebral and facet hypertrophy, resulting in  neural foraminal stenosis at multiple levels, worst at left C5-6 and  bilateral C3-4.  2. No spinal canal stenosis.    PATIENT SURVEYS:  FOTO 28/48     COGNITION: Overall cognitive status: Within functional limits for tasks assessed     SENSATION: WFL   Gait: wide BOS, decreased speed   POSTURE: rounded shoulders, forward head, increased thoracic kyphosis, and anterior pelvic tilt Observation: scapular dyskinesis with overhead mobility   PALPATION: TTP with concordant pain to bilat UT, levator, rhomboid group             Passive  Accessory Intervertebral Motion (PAIVM) Pt denies reproduction of back pain with CPA L1-L5 and UPA bilaterally L1-L5. Generally hypomobile throughout     CERVICAL ROM:    Passive ROM A/PROM (deg) eval  Flexion  WNL/WNL  Extension 10d/25% limited  Right lateral flexion 25d25% limited  Left lateral flexion 14/25% limited  Right rotation 44d/25% limited  Left rotation 42d/25% limited   (Blank rows = not tested)   OTHER ROM:   All shoulder AROM WNL with pain at Uts Elbow hypermobility with ext; flex WNL  Thoracolumbar rotation 25% limited with concordant upper trap pain to rotation direction; and L QL/ lumbar paraspinal pain with rotation bilat Scapular mobility WNL with some pain with end range scapular retraction    (Blank rows = not tested)   UPPER EXTREMITY MMT:   MMT Right eval Left eval  Shoulder flexion 4 4  Shoulder extension 5 5  Shoulder abduction 4+ 4+  Shoulder internal rotation 5 5  Shoulder external rotation 4 4  Middle trapezius 4 4  Lower trapezius 4- 4-  Elbow flexion 5 5  Elbow extension 5 5  Grip strength 38# 20#   (Blank rows = not tested)   CERVICAL SPECIAL TESTS:  Spurling's test: Negative and Distraction test: Negative       TODAY'S TREATMENT:  Ther-Ex Nustep seat 12 UE 14 L2 for gentle rotation and strengthening   PT reviewed the following HEP with patient with patient able to demonstrate a set of the following with min cuing for correction needed. PT educated patient on parameters of therex (how/when to inc/decrease intensity, frequency, rep/set range, stretch hold time, and purpose of therex) with verbalized understanding.   - Prone Scapular Retraction Y  - 1 x daily - 1-2 x weekly - 3 sets - 10-12 reps - Prone Scapular Retraction Arms at Side  - 1 x daily - 1-2 x weekly - 3 sets - 10-12 reps - Standing Bent Over Shoulder Row  - 1 x daily - 1-2 x weekly - 3 sets - 10-12 reps - Standing Shoulder External Rotation with Resistance  - 1 x  daily - 1-2 x weekly - 3 sets - 10-12 reps  Verbal review of past HEP Thoracic ext over towel roll UT stretch Post shoulder rolls      PATIENT EDUCATION:  Education details: Patient was educated on diagnosis, anatomy and pathology involved, prognosis, role of PT, and  was given an HEP, demonstrating exercise with proper form following verbal and tactile cues, and was given a paper hand out to continue exercise at home. Pt was educated on and agreed to plan of care.   Person educated: Patient Education method: Explanation, Demonstration, Verbal cues, and Handouts Education comprehension: verbalized understanding, returned demonstration, and verbal cues required     HOME EXERCISE PROGRAM: [id number]    ASSESSMENT:   CLINICAL IMPRESSION: PT reassessed goals this session where patient has met all goals to safely d/c formal PT. Patient is able to demonstrate and verbalize understanding of all HEP recommendations with minimal corrections needed. Pt given clinic contact info should further questions or concerns arise. Pt to d/c PT.        OBJECTIVE IMPAIRMENTS Abnormal gait, decreased activity tolerance, decreased balance, decreased coordination, decreased endurance, decreased mobility, difficulty walking, decreased ROM, and decreased strength.    ACTIVITY LIMITATIONS carrying, lifting, standing, squatting, sleeping, stairs, transfers, reach over head, and hygiene/grooming   PARTICIPATION LIMITATIONS: cleaning, laundry, driving, community activity, and yard work   PERSONAL FACTORS Age, Fitness, Past/current experiences, Time since onset of injury/illness/exacerbation, and 3+ comorbidities: Hidradenitis suppurativa; hip deformity bilat, HTN, protrusio acetabuli are also affecting patient's functional outcome.    REHAB POTENTIAL: Good   CLINICAL DECISION MAKING: Evolving/moderate complexity   EVALUATION COMPLEXITY: Moderate     GOALS: Goals reviewed with patient? Yes    SHORT TERM GOALS: Target date: 04/03/2022    Pt will be independent with HEP in order to improve strength and balance in order to decrease fall risk and improve function at home and work. Baseline: 03/06/22 HEP given ; 04/09/22 completing HEP Goal status: achieved      LONG TERM GOALS: Target date: 05/01/2022   Patient will increase FOTO score to 48 to demonstrate predicted increase in functional mobility to complete ADLs   Baseline: 03/06/22 28; 04/09/22 79 Goal status: achieved    2.  Pt will demonstrate full active cervical ROM in order to drive safely Baseline: R/L 44/42d; 04/09/22 R/L 70/73 Goal status: achieved    3.  Pt will demonstrate gross shoulder and periscapular strength of at least 4+/5 in order to complete heavy household ADLs Baseline: 03/06/22 R/L shoulder ER 4/4 Flex 4/4 Mid trap/scap retractors 4/4 Y lower trap 4-/4- 03/06/22 R/L shoulder ER 5/5 Flex 5/5 Mid trap/scap retractors 5/5 Y lower trap 5/5 Goal status: achieved    4.  Pt will decrease worst pain as reported on NPRS by at least 3 points in order to demonstrate clinically significant reduction in pain.   Baseline: 03/06/22 10/10 04/09/22 3/10 Goal status: achieved          PLAN: PT FREQUENCY: 1-2x/week   PT DURATION: 12 weeks   PLANNED INTERVENTIONS: Therapeutic exercises, Therapeutic activity, Neuromuscular re-education, Balance training, Gait training, Patient/Family education, Self Care, Joint mobilization, Joint manipulation, Stair training, Aquatic Therapy, Dry Needling, Electrical stimulation, Cryotherapy, Moist heat, Traction, Ultrasound, and Ionotophoresis 68m/ml Dexamethasone   PLAN FOR NEXT SESSION: Stair assessment, balance assessment   CDurwin RegesDPT  CDurwin Reges PT 03/11/2022, 9:14 AM

## 2022-04-11 ENCOUNTER — Ambulatory Visit: Payer: Medicare Other | Admitting: Physical Therapy

## 2022-04-23 DIAGNOSIS — M461 Sacroiliitis, not elsewhere classified: Secondary | ICD-10-CM | POA: Diagnosis not present

## 2022-04-23 DIAGNOSIS — M7061 Trochanteric bursitis, right hip: Secondary | ICD-10-CM | POA: Diagnosis not present

## 2022-04-23 DIAGNOSIS — Z96643 Presence of artificial hip joint, bilateral: Secondary | ICD-10-CM | POA: Diagnosis not present

## 2022-04-23 DIAGNOSIS — M722 Plantar fascial fibromatosis: Secondary | ICD-10-CM | POA: Insufficient documentation

## 2022-04-23 DIAGNOSIS — Z471 Aftercare following joint replacement surgery: Secondary | ICD-10-CM | POA: Diagnosis not present

## 2022-05-14 ENCOUNTER — Encounter: Payer: Self-pay | Admitting: Internal Medicine

## 2022-05-14 ENCOUNTER — Ambulatory Visit (INDEPENDENT_AMBULATORY_CARE_PROVIDER_SITE_OTHER): Payer: BC Managed Care – PPO | Admitting: Internal Medicine

## 2022-05-14 VITALS — BP 118/82 | HR 65 | Temp 98.4°F | Ht 72.0 in | Wt 197.4 lb

## 2022-05-14 DIAGNOSIS — L732 Hidradenitis suppurativa: Secondary | ICD-10-CM

## 2022-05-14 DIAGNOSIS — B379 Candidiasis, unspecified: Secondary | ICD-10-CM | POA: Diagnosis not present

## 2022-05-14 DIAGNOSIS — Z1329 Encounter for screening for other suspected endocrine disorder: Secondary | ICD-10-CM

## 2022-05-14 DIAGNOSIS — R430 Anosmia: Secondary | ICD-10-CM

## 2022-05-14 DIAGNOSIS — I10 Essential (primary) hypertension: Secondary | ICD-10-CM

## 2022-05-14 DIAGNOSIS — M62838 Other muscle spasm: Secondary | ICD-10-CM

## 2022-05-14 DIAGNOSIS — M47812 Spondylosis without myelopathy or radiculopathy, cervical region: Secondary | ICD-10-CM | POA: Diagnosis not present

## 2022-05-14 DIAGNOSIS — Z20822 Contact with and (suspected) exposure to covid-19: Secondary | ICD-10-CM

## 2022-05-14 DIAGNOSIS — R1030 Lower abdominal pain, unspecified: Secondary | ICD-10-CM | POA: Diagnosis not present

## 2022-05-14 DIAGNOSIS — R7303 Prediabetes: Secondary | ICD-10-CM

## 2022-05-14 DIAGNOSIS — R1084 Generalized abdominal pain: Secondary | ICD-10-CM | POA: Diagnosis not present

## 2022-05-14 DIAGNOSIS — J0100 Acute maxillary sinusitis, unspecified: Secondary | ICD-10-CM | POA: Diagnosis not present

## 2022-05-14 DIAGNOSIS — E785 Hyperlipidemia, unspecified: Secondary | ICD-10-CM | POA: Diagnosis not present

## 2022-05-14 DIAGNOSIS — Z1231 Encounter for screening mammogram for malignant neoplasm of breast: Secondary | ICD-10-CM | POA: Diagnosis not present

## 2022-05-14 MED ORDER — CYCLOBENZAPRINE HCL 5 MG PO TABS: 5.0000 mg | ORAL_TABLET | Freq: Every day | ORAL | 5 refills | Status: DC | PRN

## 2022-05-14 MED ORDER — SALINE SPRAY 0.65 % NA SOLN: 2.0000 | NASAL | 11 refills | Status: DC | PRN

## 2022-05-14 MED ORDER — FLUTICASONE PROPIONATE 50 MCG/ACT NA SUSP: 2.0000 | Freq: Every day | NASAL | 11 refills | Status: DC

## 2022-05-14 MED ORDER — SPIRONOLACTONE 50 MG PO TABS: 50.0000 mg | ORAL_TABLET | Freq: Every day | ORAL | 3 refills | Status: DC

## 2022-05-14 MED ORDER — AMLODIPINE BESYLATE 10 MG PO TABS: 10.0000 mg | ORAL_TABLET | Freq: Every day | ORAL | 3 refills | Status: DC

## 2022-05-14 MED ORDER — FLUCONAZOLE 150 MG PO TABS: 150.0000 mg | ORAL_TABLET | Freq: Once | ORAL | 0 refills | Status: AC

## 2022-05-14 MED ORDER — AZITHROMYCIN 250 MG PO TABS: ORAL_TABLET | ORAL | 0 refills | Status: AC

## 2022-05-14 MED ORDER — CETIRIZINE HCL 10 MG PO TABS: 10.0000 mg | ORAL_TABLET | Freq: Every evening | ORAL | 3 refills | Status: DC | PRN

## 2022-05-14 NOTE — Patient Instructions (Addendum)
[email or web address] free covid tests   You can try pre/probiotics Align or Culturelle over the counter    Call both #s   Please contact your utility company or the [place] for assistance: [facility], [phone].  Environmental [employer] for [employer]    Phone:   [phone]   Fax:       [phone]  Call ENT if sinus pain continues  Otolaryngology   [facility], [address], [address]   [place], [place] [address]   Phone: [phone]   Fax: [phone]   [phone] Otolaryngology [facility]   [address]   [address]   [place], [place] [address]   Phone: [phone]   Fax: [phone]      GI   [place]   [address], [place]   [place], [place] [address]   [place], [place], [place], [place] [address]   [phone] (Work)   [phone] (Fax)   Irritable bowel syndrome with constipation (Primary Dx);  NAFLD (nonalcoholic fatty liver disease);  Pelvic floor dysfunction in female Discharge Disposition: Home or Self Care   Consider covid 19 new shot and  Prevnar 20 shot   Pneumococcal Conjugate Vaccine (Prevnar 20) Suspension for Injection What is this medication? PNEUMOCOCCAL VACCINE (NEU mo KOK al vak SEEN) is a vaccine. It prevents pneumococcus bacterial infections. These bacteria can cause serious infections like pneumonia, meningitis, and blood infections. This vaccine will not treat an infection and will not cause infection. This vaccine is recommended for adults 18 years and older. This medicine may be used for other purposes; ask your health care provider or pharmacist if you have questions. COMMON BRAND NAME(S): Prevnar 20 What should I tell my care team before I take this medication? They need to know if you have any of these conditions: bleeding disorder fever immune system problems an unusual or allergic reaction to pneumococcal  vaccine, diphtheria toxoid, other vaccines, other medicines, foods, dyes, or preservatives pregnant or trying to get pregnant breast-feeding How should I use this medication? This vaccine is injected into a muscle. It is given by a health care provider. A copy of Vaccine Information Statements will be given before each vaccination. Be sure to read this information carefully each time. This sheet may change often. Talk to your health care provider about the use of this medicine in children. Special care may be needed. Overdosage: If you think you have taken too much of this medicine contact a poison control center or emergency room at once. NOTE: This medicine is only for you. Do not share this medicine with others. What if I miss a dose? This does not apply. This medicine is not for regular use. What may interact with this medication? medicines for cancer chemotherapy medicines that suppress your immune function steroid medicines like prednisone or cortisone This list may not describe all possible interactions. Give your health care provider a list of all the medicines, herbs, non-prescription drugs, or dietary supplements you use. Also tell them if you smoke, drink alcohol, or use illegal drugs. Some items may interact with your medicine. What should I watch for while using this medication? Mild fever and pain should go away in 3 days or less. Report any unusual symptoms to your health care provider. What side effects may I notice from receiving this medication? Side effects that you should report to your doctor  or health care professional as soon as possible: allergic reactions (skin rash, itching or hives; swelling of the face, lips, or tongue) confusion fast, irregular heartbeat fever over 102 degrees F muscle weakness seizures trouble breathing unusual bruising or bleeding Side effects that usually do not require medical attention (report to your doctor or health care professional if  they continue or are bothersome): fever of 102 degrees F or less headache joint pain muscle cramps, pain pain, tender at site where injected This list may not describe all possible side effects. Call your doctor for medical advice about side effects. You may report side effects to FDA at 1-800-FDA-1088. Where should I keep my medication? This vaccine is only given by a health care provider. It will not be stored at home. NOTE: This sheet is a summary. It may not cover all possible information. If you have questions about this medicine, talk to your doctor, pharmacist, or health care provider.  2023 Elsevier/Gold Standard (2020-03-30 00:00:00)

## 2022-05-14 NOTE — Progress Notes (Signed)
Chief Complaint  Patient presents with   Follow-up    3 month f/u with concerns about sinuses bothering her and stomach pain    F/u  1. Sinusitis maxillary sinuses pain x 1-2 weeks est with ENT and call f/u prn. During this time of year this always happens and she has been in a storage unit recently  Will test for ovid today   2. Lower abdominal pain mild to moderate nothing tried with h/o IBS constipation mostly linzess 145 and 290 was on these doses but was too much  She is c/w well water gas exposure given #s  Call both #s   Please contact your utility company or the [place] for assistance: [facility], [phone].  Environmental Health for [employer]    Phone:   [phone]   Fax:       [phone]    Review of Systems  Constitutional:  Negative for weight loss.  HENT:  Positive for sinus pain. Negative for hearing loss.   Eyes:  Negative for blurred vision.  Respiratory:  Negative for shortness of breath.   Cardiovascular:  Negative for chest pain.  Gastrointestinal:  Positive for abdominal pain and constipation. Negative for blood in stool.  Genitourinary:  Negative for dysuria.  Musculoskeletal:  Negative for falls and joint pain.  Skin:  Negative for rash.  Neurological:  Negative for headaches.  Psychiatric/Behavioral:  Negative for depression.    Past Medical History:  Diagnosis Date   Abdominal pain, epigastric 12/17/2015   Anxiety    pt denies having dx of anxiety   BCC (basal cell carcinoma of skin)    right foot    Bunion, right foot    Chronic cough    Closed fibular fracture 10/2008   Left - Sustained 2/2 fall down stairs (same fall as tibular fracture). S/P internal fixation.   COVID-19    mild back pain, brain fog 01/2021   Duodenitis    EGD (07/14/2011) showing chronic duodenitis, H.pyloir neg   Elevated liver enzymes    Endometriosis    S/P TAH and salpingo-oophorectomy, right   Epigastric pain     Chronic, thought 2/2 gastritis, EGD (07/14/2011) showing chronic duodenitis, H.pyloir neg // Repeat EGD (09/2011) - esophagealring, sessible polyp in stomach body, hiatal hernia - rec ad probiotic, await bx   Esophageal ring    s/p dilatation during EGD (07/2011) - Dr. 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 With chronic OA of hip   History of migraine headaches    Typical symptoms - bright lights, unilateral (starting behind her left ear), throbbing .   Hot flashes    Hypertension    Internal hemorrhoids    Osteoarthritis of hip    Ovarian cancer ([place]) 1995   Protrusio acetabuli    diagnosed at age 43   Sessile colonic polyp    noted 09/2011 -- > rec colonoscopy in 10 years, 2023   Surgical menopause 08/2007   On HRT. Occuring since 01/09 following TAH and R-salpingo-oophorectomy    Tibial fracture 10/2008   Left - Sustained 2/2 fall down stairs. Patient is S/P closed internal fixation with Smith Nephew tibial nail locked proximal and  distal   Vitamin D deficiency 11/2009   Vit D level 15 in 11/2009   Past Surgical History:  Procedure Laterality Date   ABDOMINAL HYSTERECTOMY     CESAREAN SECTION  08/2005   Primary low transverse   COLONOSCOPY  09/22/2011   XKG:[id number] polyp in the rectum/Internal hemorrhoids, benign path   ESOPHAGOGASTRODUODENOSCOPY  09/22/2011   SHF:WYOV, esophageal/Sessile polyp in the body of the stomach/Hiatal hernia/ABDOMINAL PAIN LIKELY FUNCTIONAL V. POST-INFECTIOUS IBS, path: mild chronic gastritis   FRACTURE SURGERY     Right hip   JOINT REPLACEMENT     right hip   LAPAROSCOPY  10/2000   Operative laparoscopy with lysis of right adnexal adhesions and uterointestinal adhesions   left tib fib     OTHER  SURGICAL HISTORY  02/2009   Closed treatment internal fixation, left tibia with Tamala Julian and Nephew tibial nail locked proximal and distal   TOTAL ABDOMINAL HYSTERECTOMY W/ BILATERAL SALPINGOOPHORECTOMY  08/2007   for endometriosis. With concurrent right salpingo-oophorectomy  (2009), left oophorectomy (1995)   TUBAL LIGATION  08/2005   Right tubal ligation   Family History  Problem Relation Age of Onset   Other Father        deceased while in service.    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   Patient is married with two children, Madison and Tamera Punt (son 70 y.o as of 03/22/20 going to [employer]).    She lives in [place] but children go to school in [place]    Right handed   Caffeine: no   Social Determinants of Health   Financial Resource Strain: Not on file  Food Insecurity: Not on file  Transportation Needs: Not on file  Physical Activity: Not on file  Stress: Not on file  Social Connections: Not on file  Intimate Partner Violence:  Not on file   Current Meds  Medication Sig   azithromycin (ZITHROMAX) 250 MG tablet With food Take 2 tablets on day 1, then 1 tablet daily on days 2 through 5   cetirizine (ZYRTEC ALLERGY) 10 MG tablet Take 1 tablet (10 mg total) by mouth at bedtime as needed for allergies.   fluconazole (DIFLUCAN) 150 MG tablet Take  1 tablet (150 mg total) by mouth once for 1 dose.   fluticasone (FLONASE) 50 MCG/ACT nasal spray Place 2 sprays into both nostrils daily. Prn   Multiple Vitamin (MULTIVITAMIN PO) Take by mouth.   sodium chloride (OCEAN) 0.65 % SOLN nasal spray Place 2 sprays into both nostrils as needed for congestion.   VITAMIN E PO Take by mouth.   [DISCONTINUED] amLODipine (NORVASC) 10 MG tablet Take 1 tablet (10 mg total) by mouth daily.   [DISCONTINUED] spironolactone (ALDACTONE) 50 MG tablet Take 1 tablet (50 mg total) by mouth daily. In am   Allergies  Allergen Reactions   Pineapple Shortness Of Breath and Swelling    Has Epi pen   Eggs Or Egg-Derived Products     GI issues    Hctz [Hydrochlorothiazide]     Headaches, foggy headed     Latex Swelling   Lisinopril     Cough    Morphine And Related Itching   Tape Swelling   Recent Results (from the past 2160 hour(s))  Urinalysis, Routine w reflex microscopic Urine, Clean Catch     Status: None   Collection Time: 02/28/22  7:12 PM  Result Value Ref Range   Color, Urine YELLOW YELLOW   APPearance CLEAR CLEAR   Specific Gravity, Urine 1.015 1.005 - 1.030   pH 5.5 5.0 - 8.0   Glucose, UA NEGATIVE NEGATIVE mg/dL   Hgb urine dipstick NEGATIVE NEGATIVE   Bilirubin Urine NEGATIVE NEGATIVE   Ketones, ur NEGATIVE NEGATIVE mg/dL   Protein, ur NEGATIVE NEGATIVE mg/dL   Nitrite NEGATIVE NEGATIVE   Leukocytes,Ua NEGATIVE NEGATIVE    Comment: Microscopic not done on urines with negative protein, blood, leukocytes, nitrite, or glucose < 500 mg/dL. Performed at [facility], [address], [place], [place] [address]    Urine Culture     Status: Abnormal   Collection Time: 02/28/22  7:12 PM   Specimen: Urine, Clean Catch  Result Value Ref Range   Specimen Description      URINE, CLEAN CATCH Performed at [facility], [address] [place], [place] [address]    Special Requests      NONE Performed at [facility], [address], [place], [place] [address]    Culture (A)     <10,000 COLONIES/mL INSIGNIFICANT GROWTH Performed at [facility] [address], [place], [place] [address]    Report Status 03/02/2022 FINAL    Objective  Body mass index is 26.77 kg/m. Wt Readings from Last 3 Encounters:  05/14/22 197 lb 6.4 oz (89.5 kg)  02/28/22 182 lb (82.6 kg)  02/05/22 185 lb (83.9 kg)   Temp Readings from Last 3 Encounters:  05/14/22 98.4 F (36.9 C) (Oral)  02/28/22 98.5 F (36.9 C) (Oral)  02/05/22 98 F (36.7 C) (Oral)   BP Readings from Last 3 Encounters:  05/14/22 118/82  02/28/22 (!) 153/87  02/05/22 131/82   Pulse Readings from Last 3 Encounters:  05/14/22 65  02/28/22 64  02/05/22 (!) 59    Physical Exam Vitals and nursing note reviewed.  Constitutional:      Appearance: Normal appearance. She is well-developed and well-groomed.  HENT:     Head: Normocephalic and atraumatic.  Eyes:     Conjunctiva/sclera: Conjunctivae normal.     Pupils: Pupils are equal, round, and reactive to light.  Cardiovascular:     Rate and Rhythm: Normal rate and regular rhythm.     Heart  sounds: Normal heart sounds. No murmur heard. Pulmonary:     Effort: Pulmonary effort is normal.     Breath sounds: Normal breath sounds.  Abdominal:     General: Abdomen is flat. Bowel sounds are normal.     Tenderness: There is abdominal tenderness in the right lower quadrant, suprapubic area and left lower quadrant.  Musculoskeletal:        General: No tenderness.  Skin:    General: Skin is warm and dry.  Neurological:     General: No focal deficit present.      Mental Status: She is alert and oriented to person, place, and time. Mental status is at baseline.     Cranial Nerves: Cranial nerves 2-12 are intact.     Motor: Motor function is intact.     Coordination: Coordination is intact.     Gait: Gait is intact.  Psychiatric:        Attention and Perception: Attention and perception normal.        Mood and Affect: Mood and affect normal.        Speech: Speech normal.        Behavior: Behavior normal. Behavior is cooperative.        Thought Content: Thought content normal.        Cognition and Memory: Cognition and memory normal.        Judgment: Judgment normal.     Assessment  Plan  Generalized abdominal pain and worse lower- Plan: Urine Culture, Comprehensive metabolic panel, CBC with Differential/Platelet, CT Abdomen Pelvis W Contrast F/u [facility] prn  Prediabetes - Plan: Hemoglobin A1c,  Hyperlipidemia, unspecified hyperlipidemia type - Plan: Lipid panel  Thyroid disorder screening - Plan: TSH,   Loss of smell - Plan: fluticasone (FLONASE) 50 MCG/ACT nasal spray, sodium chloride (OCEAN) 0.65 % SOLN nasal spray, cetirizine (ZYRTEC ALLERGY) 10 MG tablet F/u ent prn   Acute non-recurrent maxillary sinusitis - Plan: fluticasone (FLONASE) 50 MCG/ACT nasal spray, sodium chloride (OCEAN) 0.65 % SOLN nasal spray, cetirizine (ZYRTEC ALLERGY) 10 MG tablet, azithromycin (ZITHROMAX) 250 MG tablet  R/o Exposure to COVID-19 virus - Plan: Novel Coronavirus, NAA ([facility])    Yeast infection - Plan: fluconazole (DIFLUCAN) 150 MG tablet  Hidradenitis suppurativa - Plan: spironolactone (ALDACTONE) 50 MG tablet helping Est Dr. Mariel Kansky dermatology  Cervical arthritis - Plan: cyclobenzaprine (FLEXERIL) 5 MG tablet Muscle spasm - Plan: cyclobenzaprine (FLEXERIL) 5 MG tablet  Essential hypertension controlled  - Plan: amLODipine (NORVASC) 10 MG tablet  Also on spironlactone  HM Flu shot will get in future egg free   Hep B immune, consider hep  A vaccine h/o fatty liver  Tdap up to date 06/05/16 pna 23 had 02/19/09 HCV neg 06/05/16  HIV neg 06/05/16 covid 3/3 sch 06/15/21 4th dose had covid 01/2021    mammo 06/20/18 neg referred again [facility] with dexa  -sch 11/2021had mammo and dexa 06/17/21 scheduled [facility] 06/2022     Get copy of pap [facility] [place] saw 2019 s/p hysterectomy for endometriosis in 2007 prior [facility] in 1995 for ovarian issues per OB/GYN notes 05/2018 [facility] -no rec paps per OB/GYN no h/o abnormals per note 05/14/18 ob/gyn     Colonoscopy having done [facility] unable to due did CT colonoscopy and utd no issues chronic IBS-C  07/2019 repeat in 5 years  CONCLUSION:   Normal appearance of the colon without evidence of any polyps or masses. Recommend routine follow-up colon screening in 5 years.   Rec healthy  diet and exercise  Provider: Dr. Olivia Mackie McLean-Scocuzza-Internal Medicine

## 2022-05-15 LAB — NOVEL CORONAVIRUS, NAA: SARS-CoV-2, NAA: NOT DETECTED

## 2022-05-16 LAB — URINE CULTURE
MICRO NUMBER:: 14007089
SPECIMEN QUALITY:: ADEQUATE

## 2022-05-21 ENCOUNTER — Other Ambulatory Visit: Payer: BC Managed Care – PPO

## 2022-05-22 ENCOUNTER — Telehealth: Payer: Self-pay

## 2022-05-22 NOTE — Telephone Encounter (Signed)
Called pt was unable to leave a msg due to box being full. I called pts spouse Mr. Darnell Level he did not answer but I was able to leave a msg on his mailbox to have pt call us a back. '   This is in regards to getting her scheduled for CT scan. I have also mailed out a letter for the pt to call us back as well.

## 2022-05-26 DIAGNOSIS — N898 Other specified noninflammatory disorders of vagina: Secondary | ICD-10-CM | POA: Diagnosis not present

## 2022-05-26 DIAGNOSIS — N951 Menopausal and female climacteric states: Secondary | ICD-10-CM | POA: Diagnosis not present

## 2022-05-26 DIAGNOSIS — R109 Unspecified abdominal pain: Secondary | ICD-10-CM | POA: Diagnosis not present

## 2022-05-26 DIAGNOSIS — Z124 Encounter for screening for malignant neoplasm of cervix: Secondary | ICD-10-CM | POA: Diagnosis not present

## 2022-05-26 DIAGNOSIS — Z01419 Encounter for gynecological examination (general) (routine) without abnormal findings: Secondary | ICD-10-CM | POA: Diagnosis not present

## 2022-05-27 ENCOUNTER — Encounter: Payer: Self-pay | Admitting: Family Medicine

## 2022-05-27 ENCOUNTER — Ambulatory Visit (INDEPENDENT_AMBULATORY_CARE_PROVIDER_SITE_OTHER): Payer: BC Managed Care – PPO | Admitting: Family Medicine

## 2022-05-27 VITALS — BP 122/80 | HR 50 | Temp 97.5°F | Ht 72.0 in | Wt 199.2 lb

## 2022-05-27 DIAGNOSIS — Z23 Encounter for immunization: Secondary | ICD-10-CM

## 2022-05-27 DIAGNOSIS — R1084 Generalized abdominal pain: Secondary | ICD-10-CM

## 2022-05-27 DIAGNOSIS — I10 Essential (primary) hypertension: Secondary | ICD-10-CM | POA: Diagnosis not present

## 2022-05-27 NOTE — Progress Notes (Signed)
    SUBJECTIVE:   CHIEF COMPLAINT / HPI: transfer care  Patient presents to clinic to transfer care  No acute concerns today  Abdominal pain Recently evaluated by previous PCP for acute abdominal pain.  Awaiting CT abdomen appointment, office staff not able to get in contact with patient despite multiple attempts.  Today she is aware and will call for appointment.  She reports that the pain has since improved.  Denies any nausea, vomiting, fevers, bloody stool, diarrhea.  Has some constipation and has history of hemorrhoids.  Reviewed recent CT Colonography from 08/10/2019, in [place],  results showing no polyps in colon and notable for scattered diverticuli throughout the colon.  No evidence of diverticulitis.  HTN Asymptomatic.  Tolerating medications. Taking Amlodipine 10 mg daily.    PERTINENT  PMH / PSH:  HTN IBS Diverticulosis    OBJECTIVE:   BP 122/80 (BP Location: Left Arm, Patient Position: Sitting, Cuff Size: Normal)   Pulse (!) 50   Temp (!) 97.5 F (36.4 C) (Oral)   Ht 6' (1.829 m)   Wt 199 lb 3.2 oz (90.4 kg)   SpO2 98%   BMI 27.02 kg/m    General: Alert, no acute distress Cardio: Normal S1 and S2, RRR, no r/m/g Pulm: CTAB, normal work of breathing Abdomen: Bowel sounds normal. Abdomen soft and non-tender.  Extremities: No peripheral edema.  Neuro: Cranial nerves grossly intact   ASSESSMENT/PLAN:   Hypertension Well controlled.  Tolerating mediation.  Asymptomatic -Continue Amlodipine 10 mg daily -Follow up in 1 month for annual, will obtain labs at next visit  Abdominal pain Improving.  Awaiting CT abdomen.  No acute abdomen on exam. -Follow up with results -Continue increase soluble fiber diet -Increase water intake -Follow up in 1 month -Strict return precautions provided   HCM Colonoscopy due 07/2024 PAP- last completed 2019 by Dr Luan Pulling Flu vaccine today  PDMP Reviewed  Carollee Leitz, MD

## 2022-05-27 NOTE — Patient Instructions (Addendum)
It was a pleasure meeting you today. Thank you for allowing me to take part in your health care.  Our goals for today as we discussed include:  For your abdominal pain Follow up with scheduling an appointment for your CT abdomen Your last CT colonography from 08/10/2019 showed [place].   To help with this increase soluble fiber in diet.  Please read the education provided about this condition and if you have any questions we can discuss at next visit.   For your blood pressure Would like your blood pressure to be less than 120/80.  Continue Amlodipine 10 mg daily.  You can take this medication at night to help lower blood pressure.  Can start this tomorrow night as you had taken your dose for today. Monitor your blood pressure at home in a relaxed and quiet area.  You have received the Flu vaccine today  Please follow-up with PCP in 1-2 months  If you have any questions or concerns, please do not hesitate to call the office at [phone].  I look forward to our next visit and until then take care and stay safe.  Regards,   Carollee Leitz, MD   [place]   Diverticulitis  Diverticulitis is when small pouches in your colon (large intestine) get infected or swollen. This causes pain in the belly (abdomen) and watery poop (diarrhea). These pouches are called diverticula. The pouches form in people who have a condition called diverticulosis. What are the causes? This condition may be caused by poop (stool) that gets trapped in the pouches in your colon. The poop lets germs (bacteria) grow in the pouches. This causes the infection. What increases the risk? You are more likely to get this condition if you have small pouches in your colon. The risk is higher if: You are overweight or very overweight (obese). You do not exercise enough. You drink alcohol. You smoke or use products with tobacco in them. You eat a diet that has a lot of red meat such as beef, pork,  or lamb. You eat a diet that does not have enough fiber in it. You are older than 48 years of age. What are the signs or symptoms? Pain in the belly. Pain is often on the left side, but it may be in other areas. Fever and feeling cold. Feeling like you may vomit. Vomiting. Having cramps. Feeling full. Changes to how often you poop. Blood in your poop. How is this treated? Most cases are treated at home by: Taking over-the-counter pain medicines. Following a clear liquid diet. Taking antibiotic medicines. Resting. Very bad cases may need to be treated at a hospital. This may include: Not eating or drinking. Taking prescription pain medicine. Getting antibiotic medicines through an IV tube. Getting fluid and food through an IV tube. Having surgery. When you are feeling better, your doctor may tell you to have a test to check your colon (colonoscopy). Follow these instructions at home: Medicines Take over-the-counter and prescription medicines only as told by your doctor. These include: Antibiotics. Pain medicines. Fiber pills. Probiotics. Stool softeners. If you were prescribed an antibiotic medicine, take it as told by your doctor. Do not stop taking the antibiotic even if you start to feel better. Ask your doctor if the medicine prescribed to you requires you to avoid driving or using machinery. Eating and drinking  Follow a diet as told by your doctor. When you feel better, your doctor may tell you to change your diet. You  may need to eat a lot of fiber. Fiber makes it easier to poop (have a bowel movement). Foods with fiber include: Berries. Beans. Lentils. Green vegetables. Avoid eating red meat. General instructions Do not use any products that contain nicotine or tobacco, such as cigarettes, e-cigarettes, and chewing tobacco. If you need help quitting, ask your doctor. Exercise 3 or more times a week. Try to get 30 minutes each time. Exercise enough to sweat and  make your heart beat faster. Keep all follow-up visits as told by your doctor. This is important. Contact a doctor if: Your pain does not get better. You are not pooping like normal. Get help right away if: Your pain gets worse. Your symptoms do not get better. Your symptoms get worse very fast. You have a fever. You vomit more than one time. You have poop that is: Bloody. Black. Tarry. Summary This condition happens when small pouches in your colon get infected or swollen. Take medicines only as told by your doctor. Follow a diet as told by your doctor. Keep all follow-up visits as told by your doctor. This is important. This information is not intended to replace advice given to you by your health care provider. Make sure you discuss any questions you have with your health care provider. Document Revised: 05/09/2019 Document Reviewed: 05/09/2019 Elsevier Patient Education  [employer].

## 2022-05-29 DIAGNOSIS — M79672 Pain in left foot: Secondary | ICD-10-CM | POA: Diagnosis not present

## 2022-05-29 DIAGNOSIS — M79671 Pain in right foot: Secondary | ICD-10-CM | POA: Diagnosis not present

## 2022-05-29 DIAGNOSIS — M2011 Hallux valgus (acquired), right foot: Secondary | ICD-10-CM | POA: Diagnosis not present

## 2022-05-29 DIAGNOSIS — M19071 Primary osteoarthritis, right ankle and foot: Secondary | ICD-10-CM | POA: Diagnosis not present

## 2022-05-29 DIAGNOSIS — M2012 Hallux valgus (acquired), left foot: Secondary | ICD-10-CM | POA: Diagnosis not present

## 2022-06-01 ENCOUNTER — Encounter: Payer: Self-pay | Admitting: Family Medicine

## 2022-06-01 NOTE — Assessment & Plan Note (Addendum)
Well controlled.  Tolerating mediation.  Asymptomatic -Continue Amlodipine 10 mg daily -Follow up in 1 month for annual, will obtain labs at next visit

## 2022-06-01 NOTE — Assessment & Plan Note (Signed)
Improving.  Awaiting CT abdomen.  No acute abdomen on exam. -Follow up with results -Continue increase soluble fiber diet -Increase water intake -Follow up in 1 month -Strict return precautions provided

## 2022-06-03 ENCOUNTER — Telehealth: Payer: Self-pay | Admitting: Family Medicine

## 2022-06-03 NOTE — Telephone Encounter (Signed)
Pt called stating she has a ct scan scheduled for tomorrow and she wants to know if there another place that she can go to because she can not sit there for three hours and drink the contrast

## 2022-06-04 ENCOUNTER — Ambulatory Visit: Admission: RE | Admit: 2022-06-04 | Payer: BC Managed Care – PPO | Source: Ambulatory Visit

## 2022-06-04 NOTE — Telephone Encounter (Signed)
LMTCB

## 2022-06-04 NOTE — Telephone Encounter (Signed)
[name] states she tried to go along with the providers plan but [facility] has all these rules that are not accomadating for the [name]. [name] states she will have to call her gastroentrologist and see if she could possibly figure something out. [name] states that is three times she has had issues with [facility].

## 2022-06-04 NOTE — Telephone Encounter (Signed)
Pt returning call

## 2022-06-13 ENCOUNTER — Encounter: Payer: BC Managed Care – PPO | Admitting: Internal Medicine

## 2022-06-16 ENCOUNTER — Telehealth: Payer: Self-pay

## 2022-06-16 ENCOUNTER — Encounter: Payer: BC Managed Care – PPO | Admitting: Family Medicine

## 2022-06-16 NOTE — Telephone Encounter (Signed)
LMOM for pt to CB just to go over medications before visit with Dr. Volanda Napoleon today.

## 2022-06-20 ENCOUNTER — Ambulatory Visit: Payer: BC Managed Care – PPO | Admitting: Family

## 2022-06-20 ENCOUNTER — Ambulatory Visit
Admission: EM | Admit: 2022-06-20 | Discharge: 2022-06-20 | Disposition: A | Discharge status: Home / Self Care | Payer: Medicare Other | Attending: Emergency Medicine | Admitting: Emergency Medicine

## 2022-06-20 DIAGNOSIS — J0140 Acute pansinusitis, unspecified: Secondary | ICD-10-CM | POA: Diagnosis not present

## 2022-06-20 MED ORDER — IPRATROPIUM BROMIDE 0.06 % NA SOLN: 2.0000 | Freq: Four times a day (QID) | NASAL | 0 refills | Status: DC

## 2022-06-20 MED ORDER — PREDNISONE 20 MG PO TABS
40.0000 mg | ORAL_TABLET | Freq: Every day | ORAL | 0 refills | Status: AC
Start: 2022-06-20 — End: 2022-06-25

## 2022-06-20 MED ORDER — AMOXICILLIN-POT CLAVULANATE 875-125 MG PO TABS: 1.0000 | ORAL_TABLET | Freq: Two times a day (BID) | ORAL | 0 refills | Status: DC

## 2022-06-20 NOTE — ED Triage Notes (Signed)
Pt. Presents to UC w/ c/o a constant headache and sinus pressure for 3 weeks.

## 2022-06-20 NOTE — ED Provider Notes (Signed)
HPI  SUBJECTIVE:  [name] is a 46 y.o. female who presents with 2 to 2-1/2 weeks of sinus pain and pressure/headache, postnasal drip, sore throat, facial swelling, upper dental pain and malaise.  No fevers, nasal congestion, rhinorrhea, cough, wheeze, shortness of breath, allergy symptoms.  No antibiotics in the past month.  No double sickening.  She tried ibuprofen with slight improvement in her symptoms.  Last dose was within 6 hours of evaluation.  She has also been taking Benadryl, doing saline spray, Flonase.  The Flonase is no longer working.  Symptoms are worse with bending/lying down.  She was unable to sleep last night secondary to the pain.  She has a past medical history of chronic sinusitis, migraines and hypertension.  No history of diabetes.  LMP: 2008.  PCP: [employer].    Past Medical History:  Diagnosis Date   Abdominal pain, epigastric 12/17/2015   Anxiety    pt denies having dx of anxiety   BCC (basal cell carcinoma of skin)    right foot    Bunion, right foot    Chronic cough    Closed fibular fracture 10/2008   Left - Sustained 2/2 fall down stairs (same fall as tibular fracture). S/P internal fixation.   COVID-19    mild back pain, brain fog 01/2021   Duodenitis    EGD (07/14/2011) showing chronic duodenitis, H.pyloir neg   Elevated liver enzymes    Endometriosis    S/P TAH and salpingo-oophorectomy, right   Epigastric pain    Chronic, thought 2/2 gastritis, EGD (07/14/2011) showing chronic duodenitis, H.pyloir neg // Repeat EGD (09/2011) - esophagealring, sessible polyp in stomach body, hiatal hernia - rec ad probiotic, await bx   Esophageal ring    s/p dilatation during EGD (07/2011) - Dr. Oneida Alar   Fatty liver    Gastritis    Thought due to chronic NSAID use 2/2 pain from her congenital hip abnormality   Headache(784.0)    Migraines   Hidradenitis suppurativa 05/2006   Hip deformity, congenital    Protrusio acetabuli with articular  sclerosis and flattening of femoral heads. With chronic OA of hip   History of migraine headaches    Typical symptoms - bright lights, unilateral (starting behind her left ear), throbbing .   Hot flashes    Hypertension    Internal hemorrhoids    Osteoarthritis of hip    Ovarian cancer ([place]) 1995   Protrusio acetabuli    diagnosed at age 68   Sessile colonic polyp    noted 09/2011 -- > rec colonoscopy in 10 years, 2023   Surgical menopause 08/2007   On HRT. Occuring since 01/09 following TAH and R-salpingo-oophorectomy    Tibial fracture 10/2008   Left - Sustained 2/2 fall down stairs. Patient is S/P closed internal fixation with Smith Nephew tibial nail locked proximal and distal   Vitamin D deficiency 11/2009   Vit D level 15 in 11/2009    Past Surgical History:  Procedure Laterality Date   ABDOMINAL HYSTERECTOMY     CESAREAN SECTION  08/2005   Primary low transverse   COLONOSCOPY  09/22/2011   [id number] polyp in the rectum/Internal hemorrhoids, benign path   ESOPHAGOGASTRODUODENOSCOPY  09/22/2011   YTK:[id number], esophageal/Sessile polyp in the body of the stomach/Hiatal hernia/ABDOMINAL PAIN LIKELY FUNCTIONAL V. POST-INFECTIOUS IBS, path: mild chronic gastritis   FRACTURE SURGERY     Right hip   JOINT REPLACEMENT     right hip   LAPAROSCOPY  10/2000  Operative laparoscopy with lysis of right adnexal adhesions and uterointestinal adhesions   left tib fib     OTHER SURGICAL HISTORY  02/2009   Closed treatment internal fixation, left tibia with Tamala Julian and Nephew tibial nail locked proximal and distal   TOTAL ABDOMINAL HYSTERECTOMY W/ BILATERAL SALPINGOOPHORECTOMY  08/2007   for endometriosis. With concurrent right salpingo-oophorectomy  (2009), left oophorectomy (1995)   TUBAL LIGATION  08/2005   Right tubal ligation    Family History  Problem Relation Age of Onset   Other Father        deceased while in service.    Breast cancer Sister 26       remission   Cancer Sister         breast   Lupus Cousin    Other Mother        unknown medical history   Dementia Maternal Grandmother        at old age   Parkinson's disease Maternal Grandfather    Multiple sclerosis Other        uncle    Colon cancer Neg Hx    Liver disease Neg Hx    Migraines Neg Hx     Social History   Tobacco Use   Smoking status: Never   Smokeless tobacco: Never  Vaping Use   Vaping Use: Never used  Substance Use Topics   Alcohol use: No    Alcohol/week: 0.0 standard drinks of alcohol   Drug use: No    No current facility-administered medications for this encounter.  Current Outpatient Medications:    amoxicillin-clavulanate (AUGMENTIN) 875-125 MG tablet, Take 1 tablet by mouth every 12 (twelve) hours., Disp: 14 tablet, Rfl: 0   clindamycin (CLEOCIN T) 1 % external solution, Apply topically daily as needed., Disp: , Rfl:    ipratropium (ATROVENT) 0.06 % nasal spray, Place 2 sprays into both nostrils 4 (four) times daily., Disp: 15 mL, Rfl: 0   meloxicam (MOBIC) 15 MG tablet, Take by mouth., Disp: , Rfl:    predniSONE (DELTASONE) 20 MG tablet, Take 2 tablets (40 mg total) by mouth daily with breakfast for 5 days., Disp: 10 tablet, Rfl: 0   amLODipine (NORVASC) 10 MG tablet, Take 1 tablet (10 mg total) by mouth daily., Disp: 90 tablet, Rfl: 3   Multiple Vitamin (MULTIVITAMIN PO), Take by mouth., Disp: , Rfl:    spironolactone (ALDACTONE) 50 MG tablet, Take 1 tablet (50 mg total) by mouth daily. In am, Disp: 90 tablet, Rfl: 3  Allergies  Allergen Reactions   Pineapple Shortness Of Breath and Swelling    Has Epi pen   Eggs Or Egg-Derived Products     GI issues    Hctz [Hydrochlorothiazide]     Headaches, foggy headed     Latex Swelling   Lisinopril     Cough    Morphine And Related Itching   Tape Swelling     ROS  As noted in HPI.   Physical Exam  BP (!) 138/90   Pulse 67   Temp 97.8 F (36.6 C)   Resp 18   SpO2 99%   Constitutional: Well developed, well  nourished, no acute distress Eyes:  EOMI, conjunctiva normal bilaterally HENT: Normocephalic, atraumatic,mucus membranes moist.  Erythematous, swollen turbinates.  Positive purulent nasal congestion.  Exquisite maxillary, frontal sinus tenderness.  Positive postnasal drip. Respiratory: Normal inspiratory effort Cardiovascular: Normal rate GI: nondistended skin: No rash, skin intact Musculoskeletal: no deformities Neurologic: Alert & oriented x 3, no  focal neuro deficits Psychiatric: Speech and behavior appropriate   ED Course   Medications - No data to display  No orders of the defined types were placed in this encounter.   No results found for this or any previous visit (from the past 24 hour(s)). No results found.  ED Clinical Impression  1. Acute non-recurrent pansinusitis      ED Assessment/Plan     Presentation consistent with an acute pansinusitis.  Sending home with Augmentin for 7 days given duration of symptoms.  We will have her continue the Flonase nasal spray, but also start Atrovent nasal spray.  Advised saline nasal irrigation, will send home with prednisone 40 mg for 5 days due to the exquisite sinus tenderness.  Follow-up with PCP as needed.  ER return precautions given.  Discussed MDM, treatment plan, and plan for follow-up with patient. Discussed sn/sx that should prompt return to the ED. patient agrees with plan.   Meds ordered this encounter  Medications   amoxicillin-clavulanate (AUGMENTIN) 875-125 MG tablet    Sig: Take 1 tablet by mouth every 12 (twelve) hours.    Dispense:  14 tablet    Refill:  0   predniSONE (DELTASONE) 20 MG tablet    Sig: Take 2 tablets (40 mg total) by mouth daily with breakfast for 5 days.    Dispense:  10 tablet    Refill:  0   ipratropium (ATROVENT) 0.06 % nasal spray    Sig: Place 2 sprays into both nostrils 4 (four) times daily.    Dispense:  15 mL    Refill:  0      *This clinic note was created using Geographical information systems officer. Therefore, there may be occasional mistakes despite careful proofreading.  ?    Melynda Ripple, MD 06/21/22 [phone]

## 2022-06-20 NOTE — Discharge Instructions (Signed)
Start Mucinex to keep the mucous thin and to decongest you.  Stop the Benadryl.  You may take 600 mg of motrin with 1000 mg of tylenol up to 3-4 times a day as needed for pain. This is an effective combination for pain.  Finish the antibiotics and steroids, even if you feel better, unless a medical provider tells you to stop.  Use a NeilMed sinus rinse with distilled water as often as you want to to reduce nasal congestion. Follow the directions on the box.  Continue the Flonase.  Start Atrovent nasal spray for postnasal drip.  Go to www.goodrx.com to look up your medications. This will give you a list of where you can find your prescriptions at the most affordable prices. Or you can ask the pharmacist what the cash price is. This is frequently cheaper than going through insurance.

## 2022-06-23 DIAGNOSIS — Z1231 Encounter for screening mammogram for malignant neoplasm of breast: Secondary | ICD-10-CM | POA: Diagnosis not present

## 2022-06-23 DIAGNOSIS — G43109 Migraine with aura, not intractable, without status migrainosus: Secondary | ICD-10-CM | POA: Diagnosis not present

## 2022-06-23 DIAGNOSIS — H538 Other visual disturbances: Secondary | ICD-10-CM | POA: Diagnosis not present

## 2022-06-23 DIAGNOSIS — H5319 Other subjective visual disturbances: Secondary | ICD-10-CM | POA: Diagnosis not present

## 2022-06-23 LAB — HM MAMMOGRAPHY

## 2022-06-30 ENCOUNTER — Ambulatory Visit: Payer: BC Managed Care – PPO | Admitting: Family Medicine

## 2022-07-01 DIAGNOSIS — K581 Irritable bowel syndrome with constipation: Secondary | ICD-10-CM | POA: Diagnosis not present

## 2022-07-18 DIAGNOSIS — M5136 Other intervertebral disc degeneration, lumbar region: Secondary | ICD-10-CM | POA: Diagnosis not present

## 2022-07-18 DIAGNOSIS — M5459 Other low back pain: Secondary | ICD-10-CM | POA: Diagnosis not present

## 2022-07-18 DIAGNOSIS — M7918 Myalgia, other site: Secondary | ICD-10-CM | POA: Diagnosis not present

## 2022-07-18 DIAGNOSIS — M47814 Spondylosis without myelopathy or radiculopathy, thoracic region: Secondary | ICD-10-CM | POA: Diagnosis not present

## 2022-08-05 ENCOUNTER — Ambulatory Visit
Admission: EM | Admit: 2022-08-05 | Discharge: 2022-08-05 | Disposition: A | Discharge status: Home / Self Care | Payer: Medicare Other | Attending: Family Medicine | Admitting: Family Medicine

## 2022-08-05 DIAGNOSIS — R519 Headache, unspecified: Secondary | ICD-10-CM

## 2022-08-05 DIAGNOSIS — R509 Fever, unspecified: Secondary | ICD-10-CM

## 2022-08-05 DIAGNOSIS — J0100 Acute maxillary sinusitis, unspecified: Secondary | ICD-10-CM | POA: Diagnosis not present

## 2022-08-05 DIAGNOSIS — R051 Acute cough: Secondary | ICD-10-CM

## 2022-08-05 MED ORDER — PROMETHAZINE-DM 6.25-15 MG/5ML PO SYRP: 5.0000 mL | ORAL_SOLUTION | Freq: Four times a day (QID) | ORAL | 0 refills | Status: DC | PRN

## 2022-08-05 MED ORDER — DEXAMETHASONE SODIUM PHOSPHATE 10 MG/ML IJ SOLN
10.0000 mg | Freq: Once | INTRAMUSCULAR | Status: AC
  Administered 2022-08-05: 10 mg via INTRAMUSCULAR

## 2022-08-05 MED ORDER — AMOXICILLIN-POT CLAVULANATE 875-125 MG PO TABS: 1.0000 | ORAL_TABLET | Freq: Two times a day (BID) | ORAL | 0 refills | Status: DC

## 2022-08-05 MED ORDER — TRIAMCINOLONE ACETONIDE 55 MCG/ACT NA AERO: 1.0000 | INHALATION_SPRAY | Freq: Two times a day (BID) | NASAL | 0 refills | Status: DC

## 2022-08-05 MED ORDER — ACETAMINOPHEN 500 MG PO TABS
1000.0000 mg | ORAL_TABLET | Freq: Once | ORAL | Status: AC
  Administered 2022-08-05: 1000 mg via ORAL

## 2022-08-05 NOTE — ED Triage Notes (Signed)
Pt reports sore throat, fever, headache, sinus pressure, joint pain x 2 weeks. Was exposed to mold and wonders if that is what's making her sick. Coughing makes her throat feel raw. Took sudafed, alzelter, ibuprofen, nasal spray but no relief.

## 2022-08-05 NOTE — ED Provider Notes (Signed)
[facility]    CSN: [id number] Arrival date & time: 08/05/22  0859      History   Chief Complaint No chief complaint on file.   HPI [name] is a 46 y.o. female.   Presenting today with 2-week history of sore throat, sinus headache, facial pain and pressure, now with 3 days of bodyaches, worsening cough, chest tightness.  Denies chest pain, shortness of breath, abdominal pain, nausea vomiting or diarrhea.  Was exposed to mold 2 weeks ago helping somebody clean and thinks this triggered a sinus infection.  Taking Sudafed, antihistamines, nasal sprays, ibuprofen, Alka-Seltzer cold and sinus with minimal relief.    Past Medical History:  Diagnosis Date   Abdominal pain, epigastric 12/17/2015   Anxiety    pt denies having dx of anxiety   BCC (basal cell carcinoma of skin)    right foot    Bunion, right foot    Chronic cough    Closed fibular fracture 10/2008   Left - Sustained 2/2 fall down stairs (same fall as tibular fracture). S/P internal fixation.   COVID-19    mild back pain, brain fog 01/2021   Duodenitis    EGD (07/14/2011) showing chronic duodenitis, H.pyloir neg   Elevated liver enzymes    Endometriosis    S/P TAH and salpingo-oophorectomy, right   Epigastric pain    Chronic, thought 2/2 gastritis, EGD (07/14/2011) showing chronic duodenitis, H.pyloir neg // Repeat EGD (09/2011) - esophagealring, sessible polyp in stomach body, hiatal hernia - rec ad probiotic, await bx   Esophageal ring    s/p dilatation during EGD (07/2011) - Dr. 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 With chronic OA of hip   History of migraine headaches    Typical symptoms - bright lights, unilateral (starting behind  her left ear), throbbing .   Hot flashes    Hypertension    Internal hemorrhoids    Osteoarthritis of hip    Ovarian cancer ([place]) 1995   Protrusio acetabuli    diagnosed at age 75   Sessile colonic polyp    noted 09/2011 -- > rec colonoscopy in 10 years, 2023   Surgical menopause 08/2007   On HRT. Occuring since 01/09 following TAH and R-salpingo-oophorectomy    Tibial fracture 10/2008   Left - Sustained 2/2 fall down stairs. Patient is S/P closed internal fixation with Smith Nephew tibial nail locked proximal and distal   Vitamin D deficiency 11/2009   Vit D level 15 in 11/2009    Patient Active Problem List   Diagnosis Date Noted   Plantar fasciitis, right 04/23/2022   Hypertrophic scar 01/14/2022   Keratosis pilaris 01/14/2022   Lipoma of right thigh 09/19/2021   Pain of left mastoid 08/07/2021   Sinus mucosal thickening 08/07/2021   Cervical arthritis 08/07/2021   Mass of right thigh 07/08/2021   Encounter for long-term (current) use of high-risk medication 09/12/2020   Itching of ear 07/30/2020   Chronic neck pain 03/22/2020   Prediabetes 12/23/2019   Melasma 12/21/2019   Irritable bowel syndrome with constipation 12/21/2019   Hypothyroidism 11/02/2019   Hypertrophy of nasal turbinates 11/02/2019   Nasal obstruction 11/02/2019   Inflamed epidermoid cyst of skin  10/27/2019   Trochanteric bursitis, left hip 03/17/2019   Abnormal auditory perception of both ears 02/24/2019   Acute otitis externa of right ear 02/24/2019   Concussion with no loss of consciousness 08/12/2018   Acute pain of left shoulder 08/12/2018   Low back pain 08/12/2018   Allergic rhinitis 08/05/2018   Arthritis 07/01/2018   DDD (degenerative disc disease), lumbar 05/21/2018   Thoracic spondylosis 05/21/2018   DDD (degenerative disc disease), cervical 01/06/2018   Hidradenitis suppurativa 10/11/2017   Chronic frontal sinusitis 10/11/2017   Fatty liver 10/11/2017   Chronic ethmoidal sinusitis  09/13/2017   Photophobia of both eyes 09/13/2017   Abnormal liver enzymes 09/13/2017   History of hysterectomy with bilateral oophorectomy 06/24/2017   Estrogen deficiency 06/24/2017   Overweight (BMI 25.0-29.9) 11/17/2016   Abdominal pain 11/04/2016   Headache 12/17/2015   Chronic constipation 10/12/2012   Osteoarthritis of left hip 03/19/2012   Hx of total hip arthroplasty, bilateral 03/05/2012   Scoliosis 02/27/2012   Hot flashes 02/20/2012   Hiatal hernia 02/20/2012   Chronic pain of both hips 02/20/2012   Fatigue 12/19/2011   Anemia 06/27/2011   Acetabulum intrapelvic protrusion into pelvic region or thigh 05/15/2011   Impacted cerumen 03/06/2011   Hypertension 03/20/2010   Vitamin D deficiency 08/20/2009   Anxiety 05/18/2009   Migraine headache 05/13/2006    Past Surgical History:  Procedure Laterality Date   ABDOMINAL HYSTERECTOMY     CESAREAN SECTION  08/2005   Primary low transverse   COLONOSCOPY  09/22/2011   [id number] polyp in the rectum/Internal hemorrhoids, benign path   ESOPHAGOGASTRODUODENOSCOPY  09/22/2011   EHU:DJSH, esophageal/Sessile polyp in the body of the stomach/Hiatal hernia/ABDOMINAL PAIN LIKELY FUNCTIONAL V. POST-INFECTIOUS IBS, path: mild chronic gastritis   FRACTURE SURGERY     Right hip   JOINT REPLACEMENT     right hip   LAPAROSCOPY  10/2000   Operative laparoscopy with lysis of right adnexal adhesions and uterointestinal adhesions   left tib fib     OTHER SURGICAL HISTORY  02/2009   Closed treatment internal fixation, left tibia with Tamala Julian and Nephew tibial nail locked proximal and distal   TOTAL ABDOMINAL HYSTERECTOMY W/ BILATERAL SALPINGOOPHORECTOMY  08/2007   for endometriosis. With concurrent right salpingo-oophorectomy  (2009), left oophorectomy (1995)   TUBAL LIGATION  08/2005   Right tubal ligation    OB History   No obstetric history on file.      Home Medications    Prior to Admission medications   Medication Sig Start  Date End Date Taking? Authorizing Provider  amoxicillin-clavulanate (AUGMENTIN) 875-125 MG tablet Take 1 tablet by mouth every 12 (twelve) hours. 08/05/22  Yes Volney American, PA-C  promethazine-dextromethorphan (PROMETHAZINE-DM) 6.25-15 MG/5ML syrup Take 5 mLs by mouth 4 (four) times daily as needed. 08/05/22  Yes Volney American, PA-C  triamcinolone (NASACORT) 55 MCG/ACT AERO nasal inhaler Place 1 spray into the nose 2 (two) times daily. 08/05/22  Yes Volney American, PA-C  amLODipine (NORVASC) 10 MG tablet Take 1 tablet (10 mg total) by mouth daily. 05/14/22   McLean-Scocuzza, Nino Glow, MD  amoxicillin-clavulanate (AUGMENTIN) 875-125 MG tablet Take 1 tablet by mouth every 12 (twelve) hours. 06/20/22   Melynda Ripple, MD  clindamycin (CLEOCIN T) 1 % external solution Apply topically daily as needed. 06/10/22   [provider]  ipratropium (ATROVENT) 0.06 % nasal spray Place 2 sprays into both nostrils 4 (four) times daily. 06/20/22   Melynda Ripple, MD  Multiple Vitamin (  MULTIVITAMIN PO) Take by mouth.    [provider]  spironolactone (ALDACTONE) 50 MG tablet Take 1 tablet (50 mg total) by mouth daily. In am 05/14/22   McLean-Scocuzza, Nino Glow, MD    Family History Family History  Problem Relation Age of Onset   Other Father        deceased while in service.    Breast cancer Sister 21       remission   Cancer Sister        breast   Lupus Cousin    Other Mother        unknown medical history   Dementia Maternal Grandmother        at old age   Parkinson's disease Maternal Grandfather    Multiple sclerosis Other        uncle    Colon cancer Neg Hx    Liver disease Neg Hx    Migraines Neg Hx     Social History Social History   Tobacco Use   Smoking status: Never   Smokeless tobacco: Never  Vaping Use   Vaping Use: Never used  Substance Use Topics   Alcohol use: No    Alcohol/week: 0.0 standard drinks of alcohol   Drug use: No      Allergies   Pineapple, Eggs or egg-derived products, Hctz [hydrochlorothiazide], Latex, Lisinopril, Morphine and related, and Tape   Review of Systems Review of Systems PER HPI  Physical Exam Triage Vital Signs ED Triage Vitals  Enc Vitals Group     BP 08/05/22 1041 (!) 142/76     Pulse Rate 08/05/22 1041 90     Resp 08/05/22 1041 18     Temp 08/05/22 1041 99.8 F (37.7 C)     Temp Source 08/05/22 1041 Oral     SpO2 08/05/22 1041 96 %     Weight --      Height --      Head Circumference --      Peak Flow --      Pain Score 08/05/22 1043 5     Pain Loc --      Pain Edu? --      Excl. in [place]? --    No data found.  Updated Vital Signs BP (!) 142/76 (BP Location: Right Arm)   Pulse 90   Temp 99.8 F (37.7 C) (Oral)   Resp 18   SpO2 96%   Visual Acuity Right Eye Distance:   Left Eye Distance:   Bilateral Distance:    Right Eye Near:   Left Eye Near:    Bilateral Near:     Physical Exam Vitals and nursing note reviewed.  Constitutional:      Appearance: Normal appearance.  HENT:     Head: Atraumatic.     Right Ear: Tympanic membrane and external ear normal.     Left Ear: Tympanic membrane and external ear normal.     Nose: Congestion present.     Mouth/Throat:     Mouth: Mucous membranes are moist.     Pharynx: Posterior oropharyngeal erythema present.  Eyes:     Extraocular Movements: Extraocular movements intact.     Conjunctiva/sclera: Conjunctivae normal.  Cardiovascular:     Rate and Rhythm: Normal rate and regular rhythm.     Heart sounds: Normal heart sounds.  Pulmonary:     Effort: Pulmonary effort is normal.     Breath sounds: Normal breath sounds. No wheezing or rales.  Musculoskeletal:  General: Normal range of motion.     Cervical back: Normal range of motion and neck supple.  Skin:    General: Skin is warm and dry.  Neurological:     Mental Status: She is alert and oriented to person, place, and time.  Psychiatric:         Mood and Affect: Mood normal.        Thought Content: Thought content normal.      UC Treatments / Results  Labs (all labs ordered are listed, but only abnormal results are displayed) Labs Reviewed - No data to display  EKG   Radiology No results found.  Procedures Procedures (including critical care time)  Medications Ordered in UC Medications  acetaminophen (TYLENOL) tablet 1,000 mg (1,000 mg Oral Given 08/05/22 1134)  dexamethasone (DECADRON) injection 10 mg (10 mg Intramuscular Given 08/05/22 1137)    Initial Impression / Assessment and Plan / UC Course  I have reviewed the triage vital signs and the nursing notes.  Pertinent labs & imaging results that were available during my care of the patient were reviewed by me and considered in my medical decision making (see chart for details).     Patient requesting Tylenol while in clinic for sinus headache, will also treat with IM Decadron, Augmentin, Phenergan DM, Nasacort, supportive home care.  Unsure if progression of sinusitis where if underlying sinus infection and now with influenza-like illness.  Return precautions reviewed.  Final Clinical Impressions(s) / UC Diagnoses   Final diagnoses:  Acute maxillary sinusitis, recurrence not specified  Fever, unspecified  Acute cough  Sinus headache   Discharge Instructions   None    ED Prescriptions     Medication Sig Dispense Auth. Provider   amoxicillin-clavulanate (AUGMENTIN) 875-125 MG tablet Take 1 tablet by mouth every 12 (twelve) hours. 14 tablet Volney American, [place]   promethazine-dextromethorphan (PROMETHAZINE-DM) 6.25-15 MG/5ML syrup Take 5 mLs by mouth 4 (four) times daily as needed. 100 mL Volney American, PA-C   triamcinolone (NASACORT) 55 MCG/ACT AERO nasal inhaler Place 1 spray into the nose 2 (two) times daily. 1 each Volney American, PA-C      PDMP not reviewed this encounter.   Volney American, [place] 08/05/22  1140

## 2022-08-07 ENCOUNTER — Telehealth: Payer: Self-pay | Admitting: Emergency Medicine

## 2022-08-07 MED ORDER — FLUCONAZOLE 150 MG PO TABS: 150.0000 mg | ORAL_TABLET | Freq: Once | ORAL | 0 refills | Status: AC

## 2022-08-26 DIAGNOSIS — J0140 Acute pansinusitis, unspecified: Secondary | ICD-10-CM | POA: Diagnosis not present

## 2022-08-26 DIAGNOSIS — T3695XA Adverse effect of unspecified systemic antibiotic, initial encounter: Secondary | ICD-10-CM | POA: Diagnosis not present

## 2022-08-26 DIAGNOSIS — B379 Candidiasis, unspecified: Secondary | ICD-10-CM | POA: Diagnosis not present

## 2022-08-27 DIAGNOSIS — M722 Plantar fascial fibromatosis: Secondary | ICD-10-CM | POA: Diagnosis not present

## 2022-08-27 DIAGNOSIS — M47816 Spondylosis without myelopathy or radiculopathy, lumbar region: Secondary | ICD-10-CM | POA: Diagnosis not present

## 2022-08-27 DIAGNOSIS — M7061 Trochanteric bursitis, right hip: Secondary | ICD-10-CM | POA: Diagnosis not present

## 2022-09-10 DIAGNOSIS — K581 Irritable bowel syndrome with constipation: Secondary | ICD-10-CM | POA: Diagnosis not present

## 2022-09-10 DIAGNOSIS — R1011 Right upper quadrant pain: Secondary | ICD-10-CM | POA: Diagnosis not present

## 2022-09-10 DIAGNOSIS — K760 Fatty (change of) liver, not elsewhere classified: Secondary | ICD-10-CM | POA: Diagnosis not present

## 2022-09-10 DIAGNOSIS — M5030 Other cervical disc degeneration, unspecified cervical region: Secondary | ICD-10-CM | POA: Diagnosis not present

## 2022-09-10 DIAGNOSIS — M47814 Spondylosis without myelopathy or radiculopathy, thoracic region: Secondary | ICD-10-CM | POA: Diagnosis not present

## 2022-09-10 DIAGNOSIS — M47816 Spondylosis without myelopathy or radiculopathy, lumbar region: Secondary | ICD-10-CM | POA: Diagnosis not present

## 2022-09-10 DIAGNOSIS — Z96643 Presence of artificial hip joint, bilateral: Secondary | ICD-10-CM | POA: Diagnosis not present

## 2022-09-10 DIAGNOSIS — G8929 Other chronic pain: Secondary | ICD-10-CM | POA: Diagnosis not present

## 2022-09-10 DIAGNOSIS — I10 Essential (primary) hypertension: Secondary | ICD-10-CM | POA: Diagnosis not present

## 2022-09-10 DIAGNOSIS — Z7689 Persons encountering health services in other specified circumstances: Secondary | ICD-10-CM | POA: Diagnosis not present

## 2022-09-18 DIAGNOSIS — R112 Nausea with vomiting, unspecified: Secondary | ICD-10-CM | POA: Diagnosis not present

## 2022-09-18 DIAGNOSIS — R1011 Right upper quadrant pain: Secondary | ICD-10-CM | POA: Diagnosis not present

## 2022-09-18 DIAGNOSIS — K760 Fatty (change of) liver, not elsewhere classified: Secondary | ICD-10-CM | POA: Diagnosis not present

## 2022-10-15 DIAGNOSIS — M2011 Hallux valgus (acquired), right foot: Secondary | ICD-10-CM | POA: Diagnosis not present

## 2022-10-15 DIAGNOSIS — M2012 Hallux valgus (acquired), left foot: Secondary | ICD-10-CM | POA: Diagnosis not present

## 2022-10-21 DIAGNOSIS — M5030 Other cervical disc degeneration, unspecified cervical region: Secondary | ICD-10-CM | POA: Diagnosis not present

## 2022-10-21 DIAGNOSIS — M47816 Spondylosis without myelopathy or radiculopathy, lumbar region: Secondary | ICD-10-CM | POA: Diagnosis not present

## 2022-10-21 DIAGNOSIS — M4186 Other forms of scoliosis, lumbar region: Secondary | ICD-10-CM | POA: Diagnosis not present

## 2022-10-23 DIAGNOSIS — J320 Chronic maxillary sinusitis: Secondary | ICD-10-CM | POA: Diagnosis not present

## 2022-10-23 DIAGNOSIS — M26623 Arthralgia of bilateral temporomandibular joint: Secondary | ICD-10-CM | POA: Diagnosis not present

## 2022-10-23 DIAGNOSIS — J321 Chronic frontal sinusitis: Secondary | ICD-10-CM | POA: Diagnosis not present

## 2022-10-23 DIAGNOSIS — M26629 Arthralgia of temporomandibular joint, unspecified side: Secondary | ICD-10-CM | POA: Insufficient documentation

## 2022-10-27 DIAGNOSIS — M26629 Arthralgia of temporomandibular joint, unspecified side: Secondary | ICD-10-CM | POA: Diagnosis not present

## 2022-10-27 DIAGNOSIS — J321 Chronic frontal sinusitis: Secondary | ICD-10-CM | POA: Diagnosis not present

## 2022-10-27 DIAGNOSIS — J329 Chronic sinusitis, unspecified: Secondary | ICD-10-CM | POA: Diagnosis not present

## 2022-11-04 DIAGNOSIS — D72829 Elevated white blood cell count, unspecified: Secondary | ICD-10-CM | POA: Diagnosis not present

## 2022-11-04 DIAGNOSIS — G43109 Migraine with aura, not intractable, without status migrainosus: Secondary | ICD-10-CM | POA: Diagnosis not present

## 2022-11-04 DIAGNOSIS — G501 Atypical facial pain: Secondary | ICD-10-CM | POA: Diagnosis not present

## 2022-11-04 DIAGNOSIS — R413 Other amnesia: Secondary | ICD-10-CM | POA: Diagnosis not present

## 2022-11-04 DIAGNOSIS — M129 Arthropathy, unspecified: Secondary | ICD-10-CM | POA: Diagnosis not present

## 2022-11-04 DIAGNOSIS — M26609 Unspecified temporomandibular joint disorder, unspecified side: Secondary | ICD-10-CM | POA: Diagnosis not present

## 2022-11-04 DIAGNOSIS — E559 Vitamin D deficiency, unspecified: Secondary | ICD-10-CM | POA: Diagnosis not present

## 2022-12-09 DIAGNOSIS — R1084 Generalized abdominal pain: Secondary | ICD-10-CM | POA: Diagnosis not present

## 2022-12-31 ENCOUNTER — Encounter: Payer: Self-pay | Admitting: Emergency Medicine

## 2022-12-31 ENCOUNTER — Ambulatory Visit
Admission: EM | Admit: 2022-12-31 | Discharge: 2022-12-31 | Disposition: A | Discharge status: Home / Self Care | Payer: BC Managed Care – PPO | Attending: Emergency Medicine | Admitting: Emergency Medicine

## 2022-12-31 DIAGNOSIS — R079 Chest pain, unspecified: Secondary | ICD-10-CM | POA: Diagnosis not present

## 2022-12-31 DIAGNOSIS — I10 Essential (primary) hypertension: Secondary | ICD-10-CM | POA: Insufficient documentation

## 2022-12-31 DIAGNOSIS — J069 Acute upper respiratory infection, unspecified: Secondary | ICD-10-CM | POA: Insufficient documentation

## 2022-12-31 DIAGNOSIS — J343 Hypertrophy of nasal turbinates: Secondary | ICD-10-CM | POA: Diagnosis not present

## 2022-12-31 DIAGNOSIS — J309 Allergic rhinitis, unspecified: Secondary | ICD-10-CM | POA: Insufficient documentation

## 2022-12-31 DIAGNOSIS — Z1152 Encounter for screening for COVID-19: Secondary | ICD-10-CM | POA: Insufficient documentation

## 2022-12-31 DIAGNOSIS — B9789 Other viral agents as the cause of diseases classified elsewhere: Secondary | ICD-10-CM | POA: Insufficient documentation

## 2022-12-31 LAB — SARS CORONAVIRUS 2 BY RT PCR: SARS Coronavirus 2 by RT PCR: NEGATIVE

## 2022-12-31 LAB — GROUP A STREP BY PCR: Group A Strep by PCR: NOT DETECTED

## 2022-12-31 MED ORDER — IPRATROPIUM BROMIDE 0.06 % NA SOLN: 2.0000 | Freq: Four times a day (QID) | NASAL | 0 refills | Status: DC

## 2022-12-31 MED ORDER — PROMETHAZINE-DM 6.25-15 MG/5ML PO SYRP: 5.0000 mL | ORAL_SOLUTION | Freq: Four times a day (QID) | ORAL | 0 refills | Status: DC | PRN

## 2022-12-31 MED ORDER — BENZONATATE 100 MG PO CAPS: 200.0000 mg | ORAL_CAPSULE | Freq: Three times a day (TID) | ORAL | 0 refills | Status: DC

## 2022-12-31 NOTE — Discharge Instructions (Addendum)
Take an over-the-counter antihistamine such as Allegra 180 mg, Zyrtec or Claritin 10 mg once daily for control of your allergy symptoms.  Perform sinus irrigation with a NeilMed sinus rinse kit and distilled water 2-3 times a day to wash away pollen particles and mold spores which could be attributing to nasal congestion and mucus production.  Use the Atrovent nasal spray, 2 squirts up each nostril 4 times a day, as needed for nasal congestion and postnasal drip.  Use the Tessalon Perles every 8 hours during the day.  Take them with a small sip of water.  They may give you some numbness to the base of your tongue or a metallic taste in your mouth, this is normal.  Use the Promethazine DM cough syrup at bedtime for cough and congestion.  It will make you drowsy so do not take it during the day.  Return for reevaluation or see your primary care provider for any new or worsening symptoms.   Return for reevaluation for any new or worsening symptoms.

## 2022-12-31 NOTE — ED Triage Notes (Addendum)
Pt states for the past 3 days she has not felt well. Her head feels like she is out of it and she has nausea. She also has right arm pain that feels like her arm is being squeezed x 3 days. Today it hurts to swallow as well she does not feel like eating.

## 2022-12-31 NOTE — ED Provider Notes (Signed)
[facility]    CSN: [id number] Arrival date & time: 12/31/22  0810      History   Chief Complaint Chief Complaint  Patient presents with   Arm Pain   Nausea   Sore Throat    HPI [name] is a 47 y.o. female.   HPI  47 year old female with a history of uncontrolled hypertension, vitamin D deficiency, and migraines presents for evaluation of 3 days worth of not feeling well.  She states she feels like she is out of it and has had some nausea along with the feeling of her right arm being squeezed for the last 3 days.  She is also complaining of painful swallowing and decreased appetite.  Past Medical History:  Diagnosis Date   Abdominal pain, epigastric 12/17/2015   Anxiety    pt denies having dx of anxiety   BCC (basal cell carcinoma of skin)    right foot    Bunion, right foot    Chronic cough    Closed fibular fracture 10/2008   Left - Sustained 2/2 fall down stairs (same fall as tibular fracture). S/P internal fixation.   COVID-19    mild back pain, brain fog 01/2021   Duodenitis    EGD (07/14/2011) showing chronic duodenitis, H.pyloir neg   Elevated liver enzymes    Endometriosis    S/P TAH and salpingo-oophorectomy, right   Epigastric pain    Chronic, thought 2/2 gastritis, EGD (07/14/2011) showing chronic duodenitis, H.pyloir neg // Repeat EGD (09/2011) - esophagealring, sessible polyp in stomach body, hiatal hernia - rec ad probiotic, await bx   Esophageal ring    s/p dilatation during EGD (07/2011) - Dr. 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 With chronic OA of hip   History of migraine headaches    Typical symptoms - bright lights, unilateral (starting behind her left ear), throbbing .    Hot flashes    Hypertension    Internal hemorrhoids    Osteoarthritis of hip    Ovarian cancer (HCC) 1995   Protrusio acetabuli    diagnosed at age 74   Sessile colonic polyp    noted 09/2011 -- > rec colonoscopy in 10 years, 2023   Surgical menopause 08/2007   On HRT. Occuring since 01/09 following TAH and R-salpingo-oophorectomy    Tibial fracture 10/2008   Left - Sustained 2/2 fall down stairs. Patient is S/P closed internal fixation with Smith Nephew tibial nail locked proximal and distal   Vitamin D deficiency 11/2009   Vit D level 15 in 11/2009    Patient Active Problem List   Diagnosis Date Noted   Plantar fasciitis, right 04/23/2022   Hypertrophic scar 01/14/2022   Keratosis pilaris 01/14/2022   Lipoma of right thigh 09/19/2021   Pain of left mastoid 08/07/2021   Sinus mucosal thickening 08/07/2021   Cervical arthritis 08/07/2021   Mass of right thigh 07/08/2021   Encounter for long-term (current) use of high-risk medication 09/12/2020   Itching of ear 07/30/2020   Chronic neck pain 03/22/2020   Prediabetes 12/23/2019   Melasma 12/21/2019   Irritable bowel syndrome with constipation 12/21/2019   Hypothyroidism 11/02/2019   Hypertrophy of nasal turbinates 11/02/2019  Nasal obstruction 11/02/2019   Inflamed epidermoid cyst of skin 10/27/2019   Trochanteric bursitis, left hip 03/17/2019   Abnormal auditory perception of both ears 02/24/2019   Acute otitis externa of right ear 02/24/2019   Concussion with no loss of consciousness 08/12/2018   Acute pain of left shoulder 08/12/2018   Low back pain 08/12/2018   Allergic rhinitis 08/05/2018   Arthritis 07/01/2018   DDD (degenerative disc disease), lumbar 05/21/2018   Thoracic spondylosis 05/21/2018   DDD (degenerative disc disease), cervical 01/06/2018   Hidradenitis suppurativa 10/11/2017   Chronic frontal sinusitis 10/11/2017   Fatty liver 10/11/2017   Chronic ethmoidal sinusitis 09/13/2017   Photophobia of  both eyes 09/13/2017   Abnormal liver enzymes 09/13/2017   History of hysterectomy with bilateral oophorectomy 06/24/2017   Estrogen deficiency 06/24/2017   Overweight (BMI 25.0-29.9) 11/17/2016   Abdominal pain 11/04/2016   Headache 12/17/2015   Chronic constipation 10/12/2012   Osteoarthritis of left hip 03/19/2012   Hx of total hip arthroplasty, bilateral 03/05/2012   Scoliosis 02/27/2012   Hot flashes 02/20/2012   Hiatal hernia 02/20/2012   Chronic pain of both hips 02/20/2012   Fatigue 12/19/2011   Anemia 06/27/2011   Acetabulum intrapelvic protrusion into pelvic region or thigh 05/15/2011   Impacted cerumen 03/06/2011   Hypertension 03/20/2010   Vitamin D deficiency 08/20/2009   Anxiety 05/18/2009   Migraine headache 05/13/2006    Past Surgical History:  Procedure Laterality Date   ABDOMINAL HYSTERECTOMY     CESAREAN SECTION  08/2005   Primary low transverse   COLONOSCOPY  09/22/2011   NFA:OZHYQMV polyp in the rectum/Internal hemorrhoids, benign path   ESOPHAGOGASTRODUODENOSCOPY  09/22/2011   HQI:ONGE, esophageal/Sessile polyp in the body of the stomach/Hiatal hernia/ABDOMINAL PAIN LIKELY FUNCTIONAL V. POST-INFECTIOUS IBS, path: mild chronic gastritis   FRACTURE SURGERY     Right hip   JOINT REPLACEMENT     right hip   LAPAROSCOPY  10/2000   Operative laparoscopy with lysis of right adnexal adhesions and uterointestinal adhesions   left tib fib     OTHER SURGICAL HISTORY  02/2009   Closed treatment internal fixation, left tibia with Katrinka Blazing and Nephew tibial nail locked proximal and distal   TOTAL ABDOMINAL HYSTERECTOMY W/ BILATERAL SALPINGOOPHORECTOMY  08/2007   for endometriosis. With concurrent right salpingo-oophorectomy  (2009), left oophorectomy (1995)   TUBAL LIGATION  08/2005   Right tubal ligation    OB History   No obstetric history on file.      Home Medications    Prior to Admission medications   Medication Sig Start Date End Date Taking?  Authorizing Provider  amLODipine (NORVASC) 10 MG tablet Take 1 tablet (10 mg total) by mouth daily. 05/14/22  Yes McLean-Scocuzza, Pasty Spillers, MD  benzonatate (TESSALON) 100 MG capsule Take 2 capsules (200 mg total) by mouth every 8 (eight) hours. 12/31/22  Yes Becky Augusta, NP  spironolactone (ALDACTONE) 50 MG tablet Take 1 tablet (50 mg total) by mouth daily. In am 05/14/22  Yes McLean-Scocuzza, Pasty Spillers, MD  clindamycin (CLEOCIN T) 1 % external solution Apply topically daily as needed. 06/10/22   [provider]  ipratropium (ATROVENT) 0.06 % nasal spray Place 2 sprays into both nostrils 4 (four) times daily. 12/31/22   Becky Augusta, NP  Multiple Vitamin (MULTIVITAMIN PO) Take by mouth.    [provider]  promethazine-dextromethorphan (PROMETHAZINE-DM) 6.25-15 MG/5ML syrup Take 5 mLs by mouth 4 (four) times daily as needed. 12/31/22   Becky Augusta, NP  triamcinolone (NASACORT) 55 MCG/ACT AERO nasal inhaler Place 1 spray into the nose 2 (two) times daily. 08/05/22   Particia Nearing, PA-C    Family History Family History  Problem Relation Age of Onset   Other Father        deceased while in service.    Breast cancer Sister 59       remission   Cancer Sister        breast   Lupus Cousin    Other Mother        unknown medical history   Dementia Maternal Grandmother        at old age   Parkinson's disease Maternal Grandfather    Multiple sclerosis Other        uncle    Colon cancer Neg Hx    Liver disease Neg Hx    Migraines Neg Hx     Social History Social History   Tobacco Use   Smoking status: Never   Smokeless tobacco: Never  Vaping Use   Vaping Use: Never used  Substance Use Topics   Alcohol use: No    Alcohol/week: 0.0 standard drinks of alcohol   Drug use: No     Allergies   Pineapple, Egg-derived products, Hctz [hydrochlorothiazide], Latex, Lisinopril, Morphine and codeine, and Tape   Review of Systems Review of Systems  Constitutional:   Positive for appetite change, fatigue and fever.       Brain fog  HENT:  Positive for congestion, rhinorrhea and sore throat. Negative for ear pain.   Respiratory:  Positive for shortness of breath. Negative for cough and wheezing.   Cardiovascular:  Negative for chest pain.  Gastrointestinal:  Positive for nausea. Negative for diarrhea and vomiting.  Musculoskeletal:  Positive for arthralgias and myalgias.       Pain in her left lower leg where she has an intramedullary nail and right arm.  Neurological:  Positive for headaches.     Physical Exam Triage Vital Signs ED Triage Vitals [12/31/22 0840]  Enc Vitals Group     BP      Pulse      Resp      Temp      Temp src      SpO2      Weight      Height      Head Circumference      Peak Flow      Pain Score 10     Pain Loc      Pain Edu?      Excl. in GC?    No data found.  Updated Vital Signs BP (!) 149/94 (BP Location: Left Arm)   Pulse 63   Temp 98.3 F (36.8 C) (Oral)   Resp 18   SpO2 100%   Visual Acuity Right Eye Distance:   Left Eye Distance:   Bilateral Distance:    Right Eye Near:   Left Eye Near:    Bilateral Near:     Physical Exam Vitals and nursing note reviewed.  Constitutional:      Appearance: Normal appearance. She is not ill-appearing.  HENT:     Head: Normocephalic and atraumatic.     Right Ear: Tympanic membrane, ear canal and external ear normal. There is no impacted cerumen.     Left Ear: Tympanic membrane, ear canal and external ear normal. There is no impacted cerumen.     Nose: Congestion and rhinorrhea present.     Comments: Patient  mucosa is edematous and there is clear discharge in both nares.  The tissue has a bluish hue which is suggestive of possible allergic rhinitis.    Mouth/Throat:     Mouth: Mucous membranes are moist.     Pharynx: Oropharyngeal exudate and posterior oropharyngeal erythema present.     Comments: Bilateral tonsillar pillars are edematous and erythematous  and there is white exudate on the right tonsillar pillar. Neck:     Comments: Nontender anterior cervical lymphadenopathy present bilaterally. Cardiovascular:     Rate and Rhythm: Normal rate and regular rhythm.     Pulses: Normal pulses.     Heart sounds: Normal heart sounds. No murmur heard.    No friction rub. No gallop.  Pulmonary:     Effort: Pulmonary effort is normal.     Breath sounds: Normal breath sounds. No wheezing, rhonchi or rales.  Musculoskeletal:        General: Tenderness present. No swelling, deformity or signs of injury. Normal range of motion.     Cervical back: Normal range of motion and neck supple. No tenderness.  Lymphadenopathy:     Cervical: Cervical adenopathy present.  Skin:    General: Skin is warm and dry.     Capillary Refill: Capillary refill takes less than 2 seconds.     Findings: No bruising, erythema or rash.  Neurological:     General: No focal deficit present.     Mental Status: She is alert and oriented to person, place, and time.      UC Treatments / Results  Labs (all labs ordered are listed, but only abnormal results are displayed) Labs Reviewed  GROUP A STREP BY PCR  SARS CORONAVIRUS 2 BY RT PCR    EKG Normal sinus rhythm with ventricular rate of 62 bpm PR interval 166 ms QRS duration 70 ms QT/QTc 422/420 ms No ST or T wave abnormalities.  Radiology No results found.  Procedures Procedures (including critical care time)  Medications Ordered in UC Medications - No data to display  Initial Impression / Assessment and Plan / UC Course  I have reviewed the triage vital signs and the nursing notes.  Pertinent labs & imaging results that were available during my care of the patient were reviewed by me and considered in my medical decision making (see chart for details).   Patient is a nontoxic-appearing 47 year old female presenting for evaluation of generalized fatigue and brain fog with associated painful swallowing,  decreased appetite, headaches, nausea, nonproductive cough, and shortness of breath.  The patient does report that she had a fever 2 days ago of 100.3 but has not had a fever since then.  She also endorses taking ibuprofen around-the-clock to help with her discomfort.  When asked what bodyaches she denied but then she stated that she has been having pain in her left lower leg where she has an intramedullary nail from a fracture as well as pain in her right arm that she describes as a squeezing.  No numbness or tingling.  She has no swelling of the right arm but she does have tenderness with palpation of the muscle groups.  No other muscle groups are tender.  Radial and ulnar pulses are 2+.  Cap refill less than 3 seconds.  Grip is 5/5.  I suspect that the patient has some myositis, possibly secondary to viral infection given her cluster of upper and lower respiratory symptoms.  Given the erythema and edema of her tonsillar pillars with exudate I we  will order a strep PCR along with COVID PCR as she is outside the window for Tamiflu I would not order a flu antigen test at this time.  An EKG was collected at triage which shows normal sinus rhythm with no ST or T wave abnormalities.  There is no change when compared to EKG from 03/14/2013.  Strep PCR is negative.  COVID PCR is negative.  I will discharge patient home with a diagnosis of viral URI with cough and concomitant allergic rhinitis.  I will prescribe Atrovent nasal spray that she can use for the nasal congestion and encouraged her to take over-the-counter Claritin, Zyrtec, or Allegra.  I will also prescribe Tessalon Perles and Promethazine DM cough syrup she can use for cough and congestion.  Over-the-counter Tylenol and/or ibuprofen as needed for fever or bodyaches.  If her symptoms continue, or they worsen she should follow-up with her PCP.     Final Clinical Impressions(s) / UC Diagnoses   Final diagnoses:  Viral URI with cough  Allergic  rhinitis, unspecified seasonality, unspecified trigger     Discharge Instructions      Take an over-the-counter antihistamine such as Allegra 180 mg, Zyrtec or Claritin 10 mg once daily for control of your allergy symptoms.  Perform sinus irrigation with a [employer] sinus rinse kit and distilled water 2-3 times a day to wash away pollen particles and mold spores which could be attributing to nasal congestion and mucus production.  Use the Atrovent nasal spray, 2 squirts up each nostril 4 times a day, as needed for nasal congestion and postnasal drip.  Use the Tessalon Perles every 8 hours during the day.  Take them with a small sip of water.  They may give you some numbness to the base of your tongue or a metallic taste in your mouth, this is normal.  Use the Promethazine DM cough syrup at bedtime for cough and congestion.  It will make you drowsy so do not take it during the day.  Return for reevaluation or see your primary care provider for any new or worsening symptoms.   Return for reevaluation for any new or worsening symptoms.      ED Prescriptions     Medication Sig Dispense Auth. Provider   ipratropium (ATROVENT) 0.06 % nasal spray Place 2 sprays into both nostrils 4 (four) times daily. 15 mL Becky Augusta, NP   promethazine-dextromethorphan (PROMETHAZINE-DM) 6.25-15 MG/5ML syrup Take 5 mLs by mouth 4 (four) times daily as needed. 100 mL Becky Augusta, NP   benzonatate (TESSALON) 100 MG capsule Take 2 capsules (200 mg total) by mouth every 8 (eight) hours. 21 capsule Becky Augusta, NP      PDMP not reviewed this encounter.   Becky Augusta, NP 12/31/22 1027

## 2023-01-14 DIAGNOSIS — L732 Hidradenitis suppurativa: Secondary | ICD-10-CM | POA: Diagnosis not present

## 2023-01-14 DIAGNOSIS — L83 Acanthosis nigricans: Secondary | ICD-10-CM | POA: Insufficient documentation

## 2023-01-14 DIAGNOSIS — L249 Irritant contact dermatitis, unspecified cause: Secondary | ICD-10-CM | POA: Diagnosis not present

## 2023-01-14 DIAGNOSIS — L811 Chloasma: Secondary | ICD-10-CM | POA: Diagnosis not present

## 2023-02-12 ENCOUNTER — Ambulatory Visit
Admission: EM | Admit: 2023-02-12 | Discharge: 2023-02-12 | Disposition: A | Discharge status: Home / Self Care | Payer: BC Managed Care – PPO | Attending: Family Medicine | Admitting: Family Medicine

## 2023-02-12 DIAGNOSIS — S39012A Strain of muscle, fascia and tendon of lower back, initial encounter: Secondary | ICD-10-CM | POA: Diagnosis not present

## 2023-02-12 MED ORDER — KETOROLAC TROMETHAMINE 30 MG/ML IJ SOLN
30.0000 mg | Freq: Once | INTRAMUSCULAR | Status: AC
  Administered 2023-02-12: 30 mg via INTRAMUSCULAR

## 2023-02-12 MED ORDER — TIZANIDINE HCL 4 MG PO CAPS: 4.0000 mg | ORAL_CAPSULE | Freq: Three times a day (TID) | ORAL | 0 refills | Status: DC | PRN

## 2023-02-12 NOTE — ED Triage Notes (Signed)
Pt reports while cleaning she bended down and when she got back up her lower back was hurting x 1 day.  Took ibuprofen but no relief.

## 2023-02-16 NOTE — ED Provider Notes (Signed)
[facility]    CSN: [id number] Arrival date & time: 02/12/23  1030      History   Chief Complaint No chief complaint on file.   HPI [name] is a 47 y.o. female.   Patient presenting today with 1 day history of bilateral lower back pain after bending down cleaning something.  She denies radiation of pain down legs, numbness, tingling, weakness, saddle anesthesia, bowel or bladder incontinence.  So far trying ibuprofen with minimal relief.    Past Medical History:  Diagnosis Date   Abdominal pain, epigastric 12/17/2015   Anxiety    pt denies having dx of anxiety   BCC (basal cell carcinoma of skin)    right foot    Bunion, right foot    Chronic cough    Closed fibular fracture 10/2008   Left - Sustained 2/2 fall down stairs (same fall as tibular fracture). S/P internal fixation.   COVID-19    mild back pain, brain fog 01/2021   Duodenitis    EGD (07/14/2011) showing chronic duodenitis, H.pyloir neg   Elevated liver enzymes    Endometriosis    S/P TAH and salpingo-oophorectomy, right   Epigastric pain    Chronic, thought 2/2 gastritis, EGD (07/14/2011) showing chronic duodenitis, H.pyloir neg // Repeat EGD (09/2011) - esophagealring, sessible polyp in stomach body, hiatal hernia - rec ad probiotic, await bx   Esophageal ring    s/p dilatation during EGD (07/2011) - Dr. 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 With chronic OA of hip   History of migraine headaches    Typical symptoms - bright lights, unilateral (starting behind her left ear), throbbing .   Hot flashes    Hypertension    Internal hemorrhoids    Osteoarthritis of hip    Ovarian cancer (HCC) 1995   Protrusio acetabuli    diagnosed  at age 76   Sessile colonic polyp    noted 09/2011 -- > rec colonoscopy in 10 years, 2023   Surgical menopause 08/2007   On HRT. Occuring since 01/09 following TAH and R-salpingo-oophorectomy    Tibial fracture 10/2008   Left - Sustained 2/2 fall down stairs. Patient is S/P closed internal fixation with Smith Nephew tibial nail locked proximal and distal   Vitamin D deficiency 11/2009   Vit D level 15 in 11/2009    Patient Active Problem List   Diagnosis Date Noted   Plantar fasciitis, right 04/23/2022   Hypertrophic scar 01/14/2022   Keratosis pilaris 01/14/2022   Lipoma of right thigh 09/19/2021   Pain of left mastoid 08/07/2021   Sinus mucosal thickening 08/07/2021   Cervical arthritis 08/07/2021   Mass of right thigh 07/08/2021   Encounter for long-term (current) use of high-risk medication 09/12/2020   Itching of ear 07/30/2020   Chronic neck pain 03/22/2020   Prediabetes 12/23/2019   Melasma 12/21/2019   Irritable bowel syndrome with constipation 12/21/2019   Hypothyroidism 11/02/2019   Hypertrophy of nasal turbinates 11/02/2019   Nasal obstruction 11/02/2019   Inflamed epidermoid cyst of skin 10/27/2019   Trochanteric bursitis, left hip 03/17/2019   Abnormal auditory perception of both ears 02/24/2019   Acute otitis externa of right ear 02/24/2019  Concussion with no loss of consciousness 08/12/2018   Acute pain of left shoulder 08/12/2018   Low back pain 08/12/2018   Allergic rhinitis 08/05/2018   Arthritis 07/01/2018   DDD (degenerative disc disease), lumbar 05/21/2018   Thoracic spondylosis 05/21/2018   DDD (degenerative disc disease), cervical 01/06/2018   Hidradenitis suppurativa 10/11/2017   Chronic frontal sinusitis 10/11/2017   Fatty liver 10/11/2017   Chronic ethmoidal sinusitis 09/13/2017   Photophobia of both eyes 09/13/2017   Abnormal liver enzymes 09/13/2017   History of hysterectomy with bilateral oophorectomy 06/24/2017   Estrogen deficiency  06/24/2017   Overweight (BMI 25.0-29.9) 11/17/2016   Abdominal pain 11/04/2016   Headache 12/17/2015   Chronic constipation 10/12/2012   Osteoarthritis of left hip 03/19/2012   Hx of total hip arthroplasty, bilateral 03/05/2012   Scoliosis 02/27/2012   Hot flashes 02/20/2012   Hiatal hernia 02/20/2012   Chronic pain of both hips 02/20/2012   Fatigue 12/19/2011   Anemia 06/27/2011   Acetabulum intrapelvic protrusion into pelvic region or thigh 05/15/2011   Impacted cerumen 03/06/2011   Hypertension 03/20/2010   Vitamin D deficiency 08/20/2009   Anxiety 05/18/2009   Migraine headache 05/13/2006    Past Surgical History:  Procedure Laterality Date   ABDOMINAL HYSTERECTOMY     CESAREAN SECTION  08/2005   Primary low transverse   COLONOSCOPY  09/22/2011   WUJ:WJXBJYN polyp in the rectum/Internal hemorrhoids, benign path   ESOPHAGOGASTRODUODENOSCOPY  09/22/2011   WGN:FAOZ, esophageal/Sessile polyp in the body of the stomach/Hiatal hernia/ABDOMINAL PAIN LIKELY FUNCTIONAL V. POST-INFECTIOUS IBS, path: mild chronic gastritis   FRACTURE SURGERY     Right hip   JOINT REPLACEMENT     right hip   LAPAROSCOPY  10/2000   Operative laparoscopy with lysis of right adnexal adhesions and uterointestinal adhesions   left tib fib     OTHER SURGICAL HISTORY  02/2009   Closed treatment internal fixation, left tibia with Katrinka Blazing and Nephew tibial nail locked proximal and distal   TOTAL ABDOMINAL HYSTERECTOMY W/ BILATERAL SALPINGOOPHORECTOMY  08/2007   for endometriosis. With concurrent right salpingo-oophorectomy  (2009), left oophorectomy (1995)   TUBAL LIGATION  08/2005   Right tubal ligation    OB History   No obstetric history on file.      Home Medications    Prior to Admission medications   Medication Sig Start Date End Date Taking? Authorizing Provider  tiZANidine (ZANAFLEX) 4 MG capsule Take 1 capsule (4 mg total) by mouth 3 (three) times daily as needed for muscle spasms. Do not  drink alcohol or drive while taking this medication.  May cause drowsiness. 02/12/23  Yes Particia Nearing, PA-C  amLODipine (NORVASC) 10 MG tablet Take 1 tablet (10 mg total) by mouth daily. 05/14/22   McLean-Scocuzza, Pasty Spillers, MD  benzonatate (TESSALON) 100 MG capsule Take 2 capsules (200 mg total) by mouth every 8 (eight) hours. 12/31/22   Becky Augusta, NP  clindamycin (CLEOCIN T) 1 % external solution Apply topically daily as needed. 06/10/22   [provider]  ipratropium (ATROVENT) 0.06 % nasal spray Place 2 sprays into both nostrils 4 (four) times daily. 12/31/22   Becky Augusta, NP  Multiple Vitamin (MULTIVITAMIN PO) Take by mouth.    [provider]  promethazine-dextromethorphan (PROMETHAZINE-DM) 6.25-15 MG/5ML syrup Take 5 mLs by mouth 4 (four) times daily as needed. 12/31/22   Becky Augusta, NP  spironolactone (ALDACTONE) 50 MG tablet Take 1 tablet (50 mg total) by mouth daily. In am 05/14/22  McLean-Scocuzza, Pasty Spillers, MD  triamcinolone (NASACORT) 55 MCG/ACT AERO nasal inhaler Place 1 spray into the nose 2 (two) times daily. 08/05/22   Particia Nearing, PA-C    Family History Family History  Problem Relation Age of Onset   Other Father        deceased while in service.    Breast cancer Sister 43       remission   Cancer Sister        breast   Lupus Cousin    Other Mother        unknown medical history   Dementia Maternal Grandmother        at old age   Parkinson's disease Maternal Grandfather    Multiple sclerosis Other        uncle    Colon cancer Neg Hx    Liver disease Neg Hx    Migraines Neg Hx     Social History Social History   Tobacco Use   Smoking status: Never   Smokeless tobacco: Never  Vaping Use   Vaping Use: Never used  Substance Use Topics   Alcohol use: No    Alcohol/week: 0.0 standard drinks of alcohol   Drug use: No     Allergies   Pineapple, Egg-derived products, Hctz [hydrochlorothiazide], Latex, Lisinopril,  Morphine and codeine, and Tape   Review of Systems Review of Systems HPI  Physical Exam Triage Vital Signs ED Triage Vitals  Enc Vitals Group     BP 02/12/23 1157 129/86     Pulse Rate 02/12/23 1157 62     Resp 02/12/23 1157 18     Temp 02/12/23 1157 98.1 F (36.7 C)     Temp Source 02/12/23 1157 Oral     SpO2 02/12/23 1157 97 %     Weight --      Height --      Head Circumference --      Peak Flow --      Pain Score 02/12/23 1236 0     Pain Loc --      Pain Edu? --      Excl. in GC? --    No data found.  Updated Vital Signs BP 129/86 (BP Location: Right Arm)   Pulse 62   Temp 98.1 F (36.7 C) (Oral)   Resp 18   SpO2 97%   Visual Acuity Right Eye Distance:   Left Eye Distance:   Bilateral Distance:    Right Eye Near:   Left Eye Near:    Bilateral Near:     Physical Exam Vitals and nursing note reviewed.  Constitutional:      Appearance: Normal appearance. She is not ill-appearing.  HENT:     Head: Atraumatic.  Eyes:     Extraocular Movements: Extraocular movements intact.     Conjunctiva/sclera: Conjunctivae normal.  Cardiovascular:     Rate and Rhythm: Normal rate and regular rhythm.     Heart sounds: Normal heart sounds.  Pulmonary:     Effort: Pulmonary effort is normal.     Breath sounds: Normal breath sounds.  Musculoskeletal:        General: Tenderness present. No swelling or deformity. Normal range of motion.     Cervical back: Normal range of motion and neck supple.     Comments: Bilateral lumbar musculature tender to palpation and mild spasm.  No midline spinal tenderness to palpation diffusely.  Normal gait and range of motion.  Negative straight leg raise bilateral  lower extremities.  Skin:    General: Skin is warm and dry.  Neurological:     Mental Status: She is alert and oriented to person, place, and time.     Motor: No weakness.     Gait: Gait normal.     Comments: Bilateral lower extremities neurovascularly intact  Psychiatric:         Mood and Affect: Mood normal.        Thought Content: Thought content normal.        Judgment: Judgment normal.      UC Treatments / Results  Labs (all labs ordered are listed, but only abnormal results are displayed) Labs Reviewed - No data to display  EKG   Radiology No results found.  Procedures Procedures (including critical care time)  Medications Ordered in UC Medications  ketorolac (TORADOL) 30 MG/ML injection 30 mg (30 mg Intramuscular Given 02/12/23 1231)    Initial Impression / Assessment and Plan / UC Course  I have reviewed the triage vital signs and the nursing notes.  Pertinent labs & imaging results that were available during my care of the patient were reviewed by me and considered in my medical decision making (see chart for details).     Will treat with IM Toradol, Zanaflex, stretches, heat, massage.  Return for worsening symptoms.  No red flag findings today.  Final Clinical Impressions(s) / UC Diagnoses   Final diagnoses:  Strain of lumbar region, initial encounter   Discharge Instructions   None    ED Prescriptions     Medication Sig Dispense Auth. Provider   tiZANidine (ZANAFLEX) 4 MG capsule Take 1 capsule (4 mg total) by mouth 3 (three) times daily as needed for muscle spasms. Do not drink alcohol or drive while taking this medication.  May cause drowsiness. 15 capsule Particia Nearing, [place]      PDMP not reviewed this encounter.   Particia Nearing, [place] 02/16/23 1542

## 2023-02-21 ENCOUNTER — Emergency Department (HOSPITAL_COMMUNITY)
Admission: EM | Admit: 2023-02-21 | Discharge: 2023-02-21 | Disposition: A | Discharge status: Home / Self Care | Payer: BC Managed Care – PPO | Attending: Emergency Medicine | Admitting: Emergency Medicine

## 2023-02-21 ENCOUNTER — Emergency Department (HOSPITAL_COMMUNITY): Payer: BC Managed Care – PPO

## 2023-02-21 ENCOUNTER — Encounter (HOSPITAL_COMMUNITY): Payer: Self-pay

## 2023-02-21 ENCOUNTER — Other Ambulatory Visit: Payer: Self-pay

## 2023-02-21 DIAGNOSIS — M25511 Pain in right shoulder: Secondary | ICD-10-CM | POA: Diagnosis not present

## 2023-02-21 DIAGNOSIS — Z041 Encounter for examination and observation following transport accident: Secondary | ICD-10-CM | POA: Diagnosis not present

## 2023-02-21 DIAGNOSIS — M542 Cervicalgia: Secondary | ICD-10-CM | POA: Insufficient documentation

## 2023-02-21 DIAGNOSIS — M545 Low back pain, unspecified: Secondary | ICD-10-CM | POA: Diagnosis not present

## 2023-02-21 DIAGNOSIS — Z91040 Latex allergy status: Secondary | ICD-10-CM | POA: Diagnosis not present

## 2023-02-21 DIAGNOSIS — M19011 Primary osteoarthritis, right shoulder: Secondary | ICD-10-CM | POA: Diagnosis not present

## 2023-02-21 DIAGNOSIS — Y92410 Unspecified street and highway as the place of occurrence of the external cause: Secondary | ICD-10-CM | POA: Insufficient documentation

## 2023-02-21 DIAGNOSIS — Z79899 Other long term (current) drug therapy: Secondary | ICD-10-CM | POA: Diagnosis not present

## 2023-02-21 DIAGNOSIS — Z8543 Personal history of malignant neoplasm of ovary: Secondary | ICD-10-CM | POA: Diagnosis not present

## 2023-02-21 DIAGNOSIS — M4802 Spinal stenosis, cervical region: Secondary | ICD-10-CM | POA: Diagnosis not present

## 2023-02-21 DIAGNOSIS — I10 Essential (primary) hypertension: Secondary | ICD-10-CM | POA: Insufficient documentation

## 2023-02-21 DIAGNOSIS — M549 Dorsalgia, unspecified: Secondary | ICD-10-CM | POA: Diagnosis not present

## 2023-02-21 DIAGNOSIS — M5031 Other cervical disc degeneration,  high cervical region: Secondary | ICD-10-CM | POA: Diagnosis not present

## 2023-02-21 DIAGNOSIS — M47812 Spondylosis without myelopathy or radiculopathy, cervical region: Secondary | ICD-10-CM | POA: Diagnosis not present

## 2023-02-21 DIAGNOSIS — S43001A Unspecified subluxation of right shoulder joint, initial encounter: Secondary | ICD-10-CM | POA: Diagnosis not present

## 2023-02-21 MED ORDER — IBUPROFEN 600 MG PO TABS: 600.0000 mg | ORAL_TABLET | Freq: Four times a day (QID) | ORAL | 0 refills | Status: DC | PRN

## 2023-02-21 MED ORDER — IBUPROFEN 400 MG PO TABS
600.0000 mg | ORAL_TABLET | Freq: Once | ORAL | Status: AC
  Administered 2023-02-21: 600 mg via ORAL
  Filled 2023-02-21: qty 2

## 2023-02-21 NOTE — Discharge Instructions (Addendum)
As discussed, workup today overall reassuring.  CT imaging of your neck did not show signs of fracture or dislocation of your cervical spine.  X-ray of your shoulder did not show for sign of fracture or dislocation.  Recommend use of anti-inflammatories at home in the form of ibuprofen.  Will also recommend continued use of your prescribed Zanaflex as needed for spasming you may experience.  Recommend follow-up with primary care for reassessment of your symptoms.  Expect for symptoms to get worse over the next 1 to 2 days before they begin to get better.  Please do not hesitate to return to emergency department for worrisome signs and symptoms we discussed become apparent.
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
## 2023-03-11 DIAGNOSIS — L83 Acanthosis nigricans: Secondary | ICD-10-CM | POA: Diagnosis not present

## 2023-03-11 DIAGNOSIS — M542 Cervicalgia: Secondary | ICD-10-CM | POA: Diagnosis not present

## 2023-03-11 DIAGNOSIS — I10 Essential (primary) hypertension: Secondary | ICD-10-CM | POA: Diagnosis not present

## 2023-03-28 DIAGNOSIS — M5412 Radiculopathy, cervical region: Secondary | ICD-10-CM | POA: Diagnosis not present

## 2023-04-15 DIAGNOSIS — M533 Sacrococcygeal disorders, not elsewhere classified: Secondary | ICD-10-CM | POA: Diagnosis not present

## 2023-04-15 DIAGNOSIS — M1612 Unilateral primary osteoarthritis, left hip: Secondary | ICD-10-CM | POA: Diagnosis not present

## 2023-04-15 DIAGNOSIS — M1990 Unspecified osteoarthritis, unspecified site: Secondary | ICD-10-CM | POA: Diagnosis not present

## 2023-04-15 DIAGNOSIS — M25552 Pain in left hip: Secondary | ICD-10-CM | POA: Diagnosis not present

## 2023-04-15 DIAGNOSIS — M25551 Pain in right hip: Secondary | ICD-10-CM | POA: Diagnosis not present

## 2023-04-15 DIAGNOSIS — Z96641 Presence of right artificial hip joint: Secondary | ICD-10-CM | POA: Diagnosis not present

## 2023-04-15 DIAGNOSIS — Z96643 Presence of artificial hip joint, bilateral: Secondary | ICD-10-CM | POA: Diagnosis not present

## 2023-04-15 DIAGNOSIS — Z471 Aftercare following joint replacement surgery: Secondary | ICD-10-CM | POA: Diagnosis not present

## 2023-04-15 DIAGNOSIS — M7061 Trochanteric bursitis, right hip: Secondary | ICD-10-CM | POA: Diagnosis not present

## 2023-04-15 DIAGNOSIS — M1611 Unilateral primary osteoarthritis, right hip: Secondary | ICD-10-CM | POA: Diagnosis not present

## 2023-04-21 ENCOUNTER — Ambulatory Visit: Payer: BC Managed Care – PPO

## 2023-04-25 DIAGNOSIS — R413 Other amnesia: Secondary | ICD-10-CM | POA: Diagnosis not present

## 2023-04-25 DIAGNOSIS — G501 Atypical facial pain: Secondary | ICD-10-CM | POA: Diagnosis not present

## 2023-04-25 DIAGNOSIS — G43109 Migraine with aura, not intractable, without status migrainosus: Secondary | ICD-10-CM | POA: Diagnosis not present

## 2023-04-28 ENCOUNTER — Ambulatory Visit: Payer: BC Managed Care – PPO | Attending: Internal Medicine

## 2023-04-28 DIAGNOSIS — M542 Cervicalgia: Secondary | ICD-10-CM | POA: Insufficient documentation

## 2023-04-28 DIAGNOSIS — R293 Abnormal posture: Secondary | ICD-10-CM | POA: Insufficient documentation
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
## 2023-04-30 ENCOUNTER — Ambulatory Visit: Payer: BC Managed Care – PPO

## 2023-05-05 ENCOUNTER — Ambulatory Visit: Payer: BC Managed Care – PPO

## 2023-05-05 DIAGNOSIS — M542 Cervicalgia: Secondary | ICD-10-CM | POA: Diagnosis not present

## 2023-05-05 DIAGNOSIS — R293 Abnormal posture: Secondary | ICD-10-CM
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
## 2023-05-12 ENCOUNTER — Ambulatory Visit: Payer: BC Managed Care – PPO | Attending: Internal Medicine

## 2023-05-12 DIAGNOSIS — M542 Cervicalgia: Secondary | ICD-10-CM | POA: Insufficient documentation

## 2023-05-12 DIAGNOSIS — R293 Abnormal posture: Secondary | ICD-10-CM | POA: Diagnosis not present
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
## 2023-05-14 ENCOUNTER — Ambulatory Visit: Payer: BC Managed Care – PPO | Admitting: Family Medicine

## 2023-05-14 ENCOUNTER — Encounter: Payer: Self-pay | Admitting: Family Medicine

## 2023-05-14 VITALS — BP 124/84 | HR 61 | Temp 97.9°F | Resp 16 | Ht 72.0 in | Wt 196.8 lb

## 2023-05-14 DIAGNOSIS — F0781 Postconcussional syndrome: Secondary | ICD-10-CM

## 2023-05-14 DIAGNOSIS — J3489 Other specified disorders of nose and nasal sinuses: Secondary | ICD-10-CM

## 2023-05-14 DIAGNOSIS — E538 Deficiency of other specified B group vitamins: Secondary | ICD-10-CM | POA: Diagnosis not present

## 2023-05-14 DIAGNOSIS — R7309 Other abnormal glucose: Secondary | ICD-10-CM | POA: Diagnosis not present

## 2023-05-14 DIAGNOSIS — E039 Hypothyroidism, unspecified: Secondary | ICD-10-CM

## 2023-05-14 DIAGNOSIS — I10 Essential (primary) hypertension: Secondary | ICD-10-CM | POA: Diagnosis not present

## 2023-05-14 DIAGNOSIS — Z13220 Encounter for screening for lipoid disorders: Secondary | ICD-10-CM

## 2023-05-14 DIAGNOSIS — E785 Hyperlipidemia, unspecified: Secondary | ICD-10-CM | POA: Diagnosis not present

## 2023-05-14 DIAGNOSIS — K581 Irritable bowel syndrome with constipation: Secondary | ICD-10-CM

## 2023-05-14 DIAGNOSIS — E559 Vitamin D deficiency, unspecified: Secondary | ICD-10-CM

## 2023-05-14 LAB — VITAMIN B12: Vitamin B-12: 233 pg/mL (ref 211–911)

## 2023-05-14 LAB — CBC WITH DIFFERENTIAL/PLATELET
Basophils Absolute: 0 10*3/uL (ref 0.0–0.1)
Basophils Relative: 0.3 % (ref 0.0–3.0)
Eosinophils Absolute: 0 10*3/uL (ref 0.0–0.7)
Eosinophils Relative: 0.3 % (ref 0.0–5.0)
HCT: 40.4 % (ref 36.0–46.0)
Hemoglobin: 13 g/dL (ref 12.0–15.0)
Lymphocytes Relative: 20.9 % (ref 12.0–46.0)
Lymphs Abs: 2.5 10*3/uL (ref 0.7–4.0)
MCHC: 32.2 g/dL (ref 30.0–36.0)
MCV: 85.2 fL (ref 78.0–100.0)
Monocytes Absolute: 0.8 10*3/uL (ref 0.1–1.0)
Monocytes Relative: 7 % (ref 3.0–12.0)
Neutro Abs: 8.5 10*3/uL — ABNORMAL HIGH (ref 1.4–7.7)
Neutrophils Relative %: 71.5 % (ref 43.0–77.0)
Platelets: 355 10*3/uL (ref 150.0–400.0)
RBC: 4.74 Mil/uL (ref 3.87–5.11)
RDW: 15.3 % (ref 11.5–15.5)
WBC: 11.9 10*3/uL — ABNORMAL HIGH (ref 4.0–10.5)

## 2023-05-14 LAB — LIPID PANEL
Cholesterol: 195 mg/dL (ref 0–200)
HDL: 77.4 mg/dL (ref >39.00)
LDL Cholesterol: 106 mg/dL — ABNORMAL HIGH (ref 0–99)
NonHDL: 117.88
Total CHOL/HDL Ratio: 3
Triglycerides: 61 mg/dL (ref 0.0–149.0)
VLDL: 12.2 mg/dL (ref 0.0–40.0)

## 2023-05-14 LAB — VITAMIN D 25 HYDROXY (VIT D DEFICIENCY, FRACTURES): VITD: 27.63 ng/mL — ABNORMAL LOW (ref 30.00–100.00)

## 2023-05-14 LAB — COMPREHENSIVE METABOLIC PANEL
ALT: 24 U/L (ref 0–35)
AST: 20 U/L (ref 0–37)
Albumin: 4.4 g/dL (ref 3.5–5.2)
Alkaline Phosphatase: 79 U/L (ref 39–117)
BUN: 13 mg/dL (ref 6–23)
CO2: 29 meq/L (ref 19–32)
Calcium: 10.1 mg/dL (ref 8.4–10.5)
Chloride: 100 meq/L (ref 96–112)
Creatinine, Ser: 1.02 mg/dL (ref 0.40–1.20)
GFR: 64.67 mL/min (ref >60.00)
Glucose, Bld: 80 mg/dL (ref 70–99)
Potassium: 4.3 meq/L (ref 3.5–5.1)
Sodium: 137 meq/L (ref 135–145)
Total Bilirubin: 0.8 mg/dL (ref 0.2–1.2)
Total Protein: 7.6 g/dL (ref 6.0–8.3)

## 2023-05-14 LAB — TSH: TSH: 1.27 u[IU]/mL (ref 0.35–5.50)

## 2023-05-14 LAB — HEMOGLOBIN A1C: Hgb A1c MFr Bld: 6.2 % (ref 4.6–6.5)

## 2023-05-14 NOTE — Patient Instructions (Signed)
It was a pleasure meeting you today. Thank you for allowing me to take part in your health care.  Our goals for today as we discussed include:  Call Neurology at [facility] to see if you can schedule the infusion. They may be able to fit you in. From what I can see it looks like they tried to call you on 05/05/23 to fit you in at that time but they were unable to connect with you.  If unable to connect with Neurologist please let me know  We will get some labs today.  If they are abnormal or we need to do something about them, I will call you.  If they are normal, I will send you a message on MyChart (if it is active) or a letter in the mail.  If you don't hear from [place] in 2 weeks, please call the office at the number below.   If you have any questions or concerns, please do not hesitate to call the office at [phone].  I look forward to our next visit and until then take care and stay safe.  Regards,   Dana Allan, MD   [facility]
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
## 2023-05-19 ENCOUNTER — Ambulatory Visit: Payer: BC Managed Care – PPO

## 2023-05-19 DIAGNOSIS — M542 Cervicalgia: Secondary | ICD-10-CM

## 2023-05-19 DIAGNOSIS — R293 Abnormal posture: Secondary | ICD-10-CM
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
## 2023-05-21 ENCOUNTER — Other Ambulatory Visit: Payer: Self-pay | Admitting: Medical Genetics

## 2023-05-21 DIAGNOSIS — Z006 Encounter for examination for normal comparison and control in clinical research program: Secondary | ICD-10-CM

## 2023-05-22 ENCOUNTER — Encounter: Payer: Self-pay | Admitting: Family Medicine

## 2023-05-22 ENCOUNTER — Ambulatory Visit: Payer: BC Managed Care – PPO | Admitting: Family Medicine

## 2023-05-22 DIAGNOSIS — R7309 Other abnormal glucose: Secondary | ICD-10-CM | POA: Insufficient documentation

## 2023-05-22 DIAGNOSIS — F0781 Postconcussional syndrome: Secondary | ICD-10-CM | POA: Insufficient documentation

## 2023-05-22 DIAGNOSIS — E785 Hyperlipidemia, unspecified: Secondary | ICD-10-CM | POA: Insufficient documentation

## 2023-05-22 DIAGNOSIS — E538 Deficiency of other specified B group vitamins: Secondary | ICD-10-CM | POA: Insufficient documentation
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
## 2023-05-22 NOTE — Assessment & Plan Note (Addendum)
History of hypothyroid and not on medication.  Could not find previous documentation of elevated TSH/T4 or previous treatment Check TSH

## 2023-05-22 NOTE — Assessment & Plan Note (Signed)
Dry cough present for several months, possibly related to postnasal drip. -Resume use of previously prescribed Nasacort and ipratropium nasal sprays. -Increase fluid intake to thin mucus.

## 2023-05-22 NOTE — Assessment & Plan Note (Addendum)
Managed with current medication. -Continue Amlodipine 10 mg daily -Continue Aldactone 50 mg daily -Check labs

## 2023-05-22 NOTE — Assessment & Plan Note (Signed)
Ongoing issues with bowel movements, previously prescribed Amitiza. -Refer to gastroenterology for further evaluation and management. Patient to decide whether to continue at [facility] or switch

## 2023-05-22 NOTE — Assessment & Plan Note (Signed)
Check fasting lipids 

## 2023-05-26 ENCOUNTER — Ambulatory Visit: Payer: BC Managed Care – PPO

## 2023-05-26 DIAGNOSIS — G43901 Migraine, unspecified, not intractable, with status migrainosus: Secondary | ICD-10-CM | POA: Diagnosis not present

## 2023-05-28 ENCOUNTER — Other Ambulatory Visit: Payer: Self-pay

## 2023-05-28 MED ORDER — CHOLECALCIFEROL 1.25 MG (50000 UT) PO CAPS: 50000.0000 [IU] | ORAL_CAPSULE | ORAL | 1 refills | Status: DC

## 2023-06-02 ENCOUNTER — Other Ambulatory Visit: Payer: Self-pay | Admitting: Family Medicine

## 2023-06-02 ENCOUNTER — Telehealth: Payer: Self-pay | Admitting: Family Medicine

## 2023-06-02 ENCOUNTER — Ambulatory Visit: Payer: BC Managed Care – PPO

## 2023-06-02 ENCOUNTER — Other Ambulatory Visit: Payer: Self-pay

## 2023-06-02 DIAGNOSIS — I10 Essential (primary) hypertension: Secondary | ICD-10-CM

## 2023-06-02 DIAGNOSIS — L732 Hidradenitis suppurativa: Secondary | ICD-10-CM

## 2023-06-02 NOTE — Telephone Encounter (Signed)
Patient just called and said she needs 2 refills. She is out. amLODipine (NORVASC) 10 MG tablet and spironolactone (ALDACTONE) 50 MG tablet. The pharmacy she use is [facility] [address], [place] - [address] [place], [place] [address] Phone: [phone]  Fax: [phone]  Her number is [phone].

## 2023-06-02 NOTE — Telephone Encounter (Signed)
I seen where the Amlodipine was sent in today but I sent the refill request for the Spironolactone to you.

## 2023-06-04 ENCOUNTER — Ambulatory Visit (INDEPENDENT_AMBULATORY_CARE_PROVIDER_SITE_OTHER): Payer: 59 | Admitting: Family Medicine

## 2023-06-04 ENCOUNTER — Encounter: Payer: Self-pay | Admitting: Family Medicine

## 2023-06-04 VITALS — BP 128/84 | HR 59 | Temp 98.2°F | Resp 16 | Ht 72.0 in | Wt 197.5 lb

## 2023-06-04 DIAGNOSIS — K5909 Other constipation: Secondary | ICD-10-CM

## 2023-06-04 DIAGNOSIS — K581 Irritable bowel syndrome with constipation: Secondary | ICD-10-CM | POA: Diagnosis not present

## 2023-06-04 DIAGNOSIS — L732 Hidradenitis suppurativa: Secondary | ICD-10-CM | POA: Diagnosis not present

## 2023-06-04 DIAGNOSIS — D72829 Elevated white blood cell count, unspecified: Secondary | ICD-10-CM | POA: Diagnosis not present

## 2023-06-04 DIAGNOSIS — E663 Overweight: Secondary | ICD-10-CM

## 2023-06-04 DIAGNOSIS — E559 Vitamin D deficiency, unspecified: Secondary | ICD-10-CM

## 2023-06-04 LAB — CBC
HCT: 40.3 % (ref 36.0–46.0)
Hemoglobin: 12.6 g/dL (ref 12.0–15.0)
MCHC: 31.3 g/dL (ref 30.0–36.0)
MCV: 85.5 fL (ref 78.0–100.0)
Platelets: 359 10*3/uL (ref 150.0–400.0)
RBC: 4.71 Mil/uL (ref 3.87–5.11)
RDW: 15.2 % (ref 11.5–15.5)
WBC: 13.4 10*3/uL — ABNORMAL HIGH (ref 4.0–10.5)

## 2023-06-04 MED ORDER — CLINDAMYCIN PHOSPHATE 1 % EX SOLN: Freq: Every day | CUTANEOUS | 0 refills | Status: DC | PRN

## 2023-06-04 NOTE — Progress Notes (Unsigned)
SUBJECTIVE:   Chief Complaint  Patient presents with   Cough    Feels like something is stuck in her throat for a long time.    HPI Presents to clinic for follow up labs  Discussed the use of AI scribe software for clinical note transcription with the patient, who gave verbal consent to proceed.  History of Present Illness The patient, with a history of chronic constipation, borderline diabetes, and a recent elevation in white blood cell count, presented for a follow-up visit. They were initially contacted due to slightly elevated white blood cell counts and were scheduled for a follow-up appointment. The patient expressed concern about the elevated white blood cell count, but also noted that they had received a steroid injection for bursitis two weeks prior to the last visit, which could have contributed to the elevation.  The patient also reported chronic constipation, which they believe may be contributing to their weight fluctuations. They have been managing their constipation with lactulose and dietary modifications, but have been without regular GI follow-up due to their previous GI doctor leaving the practice. They have an upcoming appointment with a new GI doctor in December. They are requesting referral to GI at [facility] for continued management.  In addition to constipation, the patient has been dealing with Hidradenitis Suppurativa, for which they have been prescribed spironolactone and Cleocin by a dermatologist. They reported a recent flare-up of skin issues after running out of spironolactone.  The patient also expressed concern about being borderline diabetic and is interested in making lifestyle changes to prevent the progression to diabetes. They reported daily exercise and a healthy diet, but have been told they may not be eating enough. They expressed interest in consulting with a dietitian to help manage their diet and weight.  The patient has a history of multiple abdominal  surgeries, including a hysterectomy and a procedure to enable pregnancy. They have not had any recent changes in bowel habits or any signs of blood in their stool. They reported no current abdominal pain.    PERTINENT PMH / PSH: As above  OBJECTIVE:  BP 128/84   Pulse (!) 59   Temp 98.2 F (36.8 C)   Resp 16   Ht 6' (1.829 m)   Wt 197 lb 8 oz (89.6 kg)   SpO2 98%   BMI 26.79 kg/m    Physical Exam Vitals reviewed.  Constitutional:      General: She is not in acute distress.    Appearance: Normal appearance. She is normal weight. She is not ill-appearing, toxic-appearing or diaphoretic.  Eyes:     General:        Right eye: No discharge.        Left eye: No discharge.     Conjunctiva/sclera: Conjunctivae normal.  Cardiovascular:     Rate and Rhythm: Normal rate and regular rhythm.     Heart sounds: Normal heart sounds.  Pulmonary:     Effort: Pulmonary effort is normal.     Breath sounds: Normal breath sounds.  Abdominal:     General: Bowel sounds are normal.  Musculoskeletal:        General: Normal range of motion.  Skin:    General: Skin is warm and dry.  Neurological:     General: No focal deficit present.     Mental Status: She is alert and oriented to person, place, and time. Mental status is at baseline.  Psychiatric:        Mood and  Affect: Mood normal.        Behavior: Behavior normal.        Thought Content: Thought content normal.        Judgment: Judgment normal.        05/14/2023   11:05 AM 05/27/2022   10:18 AM 05/14/2022   10:23 AM 02/05/2022    8:55 AM 07/25/2021    8:35 AM  Depression screen PHQ 2/9  Decreased Interest 0 0 0 0 0  Down, Depressed, Hopeless 0 0 0 0 0  PHQ - 2 Score 0 0 0 0 0  Altered sleeping 3      Tired, decreased energy 1      Change in appetite 2      Feeling bad or failure about yourself  0      Trouble concentrating 0      Moving slowly or fidgety/restless 0      Suicidal thoughts 0      PHQ-9 Score 6      Difficult  doing work/chores Not difficult at all          05/14/2023   11:06 AM 12/21/2019    2:57 PM 11/15/2019   10:29 AM  GAD 7 : Generalized Anxiety Score  Nervous, Anxious, on Edge 0 0 0  Control/stop worrying 1 0 0  Worry too much - different things 1 0 0  Trouble relaxing 1 0 0  Restless 0 0 0  Easily annoyed or irritable 2 0 0  Afraid - awful might happen 0 0 0  Total GAD 7 Score 5 0 0  Anxiety Difficulty Not difficult at all Not difficult at all Not difficult at all    ASSESSMENT/PLAN:  There are no diagnoses linked to this encounter.   Elevated White Blood Cell Count Likely due to recent steroid injection for hip bursitis. No current symptoms of infection or fever. -Repeat CBC today to ensure levels have normalized.  Vitamin D Deficiency Patient reported prescription has not been filled yet. -Check on status of Vitamin D prescription.  Chronic Constipation Patient has history of multiple abdominal surgeries including hysterectomy and has been previously managed by GI specialist. Currently experiencing constipation with bowel movements approximately once a week. -Referral to GI for further evaluation and management. -Continue current management with lactulose as needed.  Weight Management Patient reports fluctuating weight and concerns about borderline diabetes. -Consider referral to [facility] or Delrae Rend MD for dietitian consult and weight management program.  Acne Patient reports flare-ups when not on medication. -Refill prescription for Cleocin.  Follow-up -Check on status of GI referral if no contact within two weeks. -Discuss results of CBC when available.      PDMP reviewed  No follow-ups on file.  Dana Allan, MD

## 2023-06-04 NOTE — Patient Instructions (Addendum)
It was a pleasure meeting you today. Thank you for allowing me to take part in your health care.  Our goals for today as we discussed include:  Repeat labs today  Referral sent to GI  Follow up as needed  This is a list of the screening recommended for you and due dates:  Health Maintenance  Topic Date Due   Medicare Annual Wellness Visit  Never done   COVID-19 Vaccine (8 - 2023-24 season) 04/12/2023   Flu Shot  11/09/2023*   DTaP/Tdap/Td vaccine (7 - Td or Tdap) 06/05/2026   Colon Cancer Screening  08/09/2029   Hepatitis C Screening  Completed   HIV Screening  Completed   HPV Vaccine  Aged Out  *Topic was postponed. The date shown is not the original due date.      If you have any questions or concerns, please do not hesitate to call the office at [phone].  I look forward to our next visit and until then take care and stay safe.  Regards,   Dana Allan, MD   [facility]

## 2023-06-05 ENCOUNTER — Ambulatory Visit: Payer: BC Managed Care – PPO

## 2023-06-05 DIAGNOSIS — M542 Cervicalgia: Secondary | ICD-10-CM

## 2023-06-05 DIAGNOSIS — R293 Abnormal posture: Secondary | ICD-10-CM | POA: Diagnosis not present
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
## 2023-06-07 ENCOUNTER — Encounter: Payer: Self-pay | Admitting: Family Medicine

## 2023-06-07 DIAGNOSIS — D72829 Elevated white blood cell count, unspecified: Secondary | ICD-10-CM | POA: Insufficient documentation

## 2023-06-07 NOTE — Assessment & Plan Note (Addendum)
Patient reported prescription has not been filled yet. -Continue Vitamin D 1.25 mg weekly.

## 2023-06-07 NOTE — Assessment & Plan Note (Signed)
Patient has history of multiple abdominal surgeries including hysterectomy and has been previously managed by GI specialist. Currently experiencing constipation with bowel movements approximately once a week. -Referral to GI for further evaluation and management.  Patient is requesting GI closer to home. -Continue current management with lactulose as needed.

## 2023-06-07 NOTE — Assessment & Plan Note (Addendum)
Likely due to recent steroid injection for hip bursitis. Possible recent flare of HS contributing to elevated WBC. No current symptoms of pain or fever, doubt bacterial or viral etiology.   -Repeat CBC today to ensure levels have normalized.

## 2023-06-07 NOTE — Assessment & Plan Note (Signed)
Patient reports fluctuating weight and concerns about borderline diabetes. -Consider referral to [facility] or Delrae Rend MD for dietitian consult and weight management program.

## 2023-06-07 NOTE — Assessment & Plan Note (Signed)
Patient reports flare-ups when not on medication. Does not have appointment with Dermatologist for few months.  Typically has refilled by Dermatology.  Also reports taking Spironolactone for HS also managed by Dermatology.   -Refill prescription for Cleocin. Can follow up with Dermatology for future refills. -Continue Aldactone as prescribed by Dermatology

## 2023-06-08 ENCOUNTER — Telehealth: Payer: Self-pay | Admitting: Family Medicine

## 2023-06-08 NOTE — Telephone Encounter (Signed)
Left message to return call to our office.  

## 2023-06-08 NOTE — Telephone Encounter (Signed)
Lft pt vm to call ofc to f/u on referral. thanks

## 2023-06-08 NOTE — Telephone Encounter (Signed)
Pt would like her test results. Please advise and Thank you!  Call pt @ [phone]

## 2023-06-08 NOTE — Telephone Encounter (Signed)
Patient just returned phone call. I told her to check her MyChart. She said she would like for someone to call her back. She said she feels the numbers are not right on her test and she wants someone to explain it to her. Her number is [phone].

## 2023-06-09 ENCOUNTER — Ambulatory Visit: Payer: BC Managed Care – PPO

## 2023-06-09 DIAGNOSIS — M542 Cervicalgia: Secondary | ICD-10-CM

## 2023-06-09 DIAGNOSIS — R293 Abnormal posture: Secondary | ICD-10-CM
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
## 2023-06-09 NOTE — Telephone Encounter (Signed)
Patient states she is returning our call.  I transferred call to Kristie Cowman, CMA.

## 2023-06-09 NOTE — Telephone Encounter (Signed)
Lvm for pt to give office a call back

## 2023-06-11 ENCOUNTER — Other Ambulatory Visit: Payer: Self-pay | Admitting: Family Medicine

## 2023-06-15 ENCOUNTER — Other Ambulatory Visit: Payer: Self-pay | Admitting: Family Medicine

## 2023-06-15 ENCOUNTER — Other Ambulatory Visit: Payer: Self-pay | Admitting: *Deleted

## 2023-06-15 MED ORDER — CETIRIZINE HCL 10 MG PO TABS: 10.0000 mg | ORAL_TABLET | Freq: Every day | ORAL | 3 refills | Status: DC

## 2023-06-16 ENCOUNTER — Ambulatory Visit: Payer: BC Managed Care – PPO | Attending: Internal Medicine

## 2023-06-16 ENCOUNTER — Ambulatory Visit (INDEPENDENT_AMBULATORY_CARE_PROVIDER_SITE_OTHER): Payer: 59 | Admitting: Family Medicine

## 2023-06-16 ENCOUNTER — Encounter: Payer: Self-pay | Admitting: Family Medicine

## 2023-06-16 VITALS — BP 124/76 | HR 61 | Temp 98.4°F | Resp 16 | Ht 72.0 in | Wt 198.1 lb

## 2023-06-16 DIAGNOSIS — D72829 Elevated white blood cell count, unspecified: Secondary | ICD-10-CM | POA: Diagnosis not present

## 2023-06-16 DIAGNOSIS — R293 Abnormal posture: Secondary | ICD-10-CM | POA: Insufficient documentation

## 2023-06-16 DIAGNOSIS — L732 Hidradenitis suppurativa: Secondary | ICD-10-CM | POA: Diagnosis not present

## 2023-06-16 DIAGNOSIS — M542 Cervicalgia: Secondary | ICD-10-CM | POA: Insufficient documentation

## 2023-06-16 DIAGNOSIS — J3489 Other specified disorders of nose and nasal sinuses: Secondary | ICD-10-CM | POA: Diagnosis not present

## 2023-06-16 DIAGNOSIS — R058 Other specified cough: Secondary | ICD-10-CM

## 2023-06-16 DIAGNOSIS — Z0000 Encounter for general adult medical examination without abnormal findings: Secondary | ICD-10-CM

## 2023-06-16 DIAGNOSIS — R8290 Unspecified abnormal findings in urine: Secondary | ICD-10-CM | POA: Diagnosis not present

## 2023-06-16 DIAGNOSIS — R350 Frequency of micturition: Secondary | ICD-10-CM

## 2023-06-16 LAB — CBC WITH DIFFERENTIAL/PLATELET
Basophils Absolute: 0 10*3/uL (ref 0.0–0.1)
Basophils Relative: 0.3 % (ref 0.0–3.0)
Eosinophils Absolute: 0 10*3/uL (ref 0.0–0.7)
Eosinophils Relative: 0.1 % (ref 0.0–5.0)
HCT: 39.2 % (ref 36.0–46.0)
Hemoglobin: 12.5 g/dL (ref 12.0–15.0)
Lymphocytes Relative: 19.3 % (ref 12.0–46.0)
Lymphs Abs: 1.9 10*3/uL (ref 0.7–4.0)
MCHC: 32 g/dL (ref 30.0–36.0)
MCV: 85.5 fL (ref 78.0–100.0)
Monocytes Absolute: 0.9 10*3/uL (ref 0.1–1.0)
Monocytes Relative: 9.4 % (ref 3.0–12.0)
Neutro Abs: 7 10*3/uL (ref 1.4–7.7)
Neutrophils Relative %: 70.9 % (ref 43.0–77.0)
Platelets: 385 10*3/uL (ref 150.0–400.0)
RBC: 4.58 Mil/uL (ref 3.87–5.11)
RDW: 15.2 % (ref 11.5–15.5)
WBC: 9.9 10*3/uL (ref 4.0–10.5)

## 2023-06-16 LAB — POC URINALSYSI DIPSTICK (AUTOMATED)
Bilirubin, UA: NEGATIVE
Blood, UA: NEGATIVE
Glucose, UA: NEGATIVE
Ketones, UA: NEGATIVE
Nitrite, UA: NEGATIVE
Protein, UA: NEGATIVE
Spec Grav, UA: 1.01 (ref 1.010–1.025)
Urobilinogen, UA: 0.2 U/dL
pH, UA: 6 (ref 5.0–8.0)

## 2023-06-16 LAB — C-REACTIVE PROTEIN: CRP: <1 mg/dL (ref 0.5–20.0)

## 2023-06-16 LAB — SEDIMENTATION RATE: Sed Rate: 27 mm/h — ABNORMAL HIGH (ref 0–20)

## 2023-06-16 MED ORDER — IPRATROPIUM BROMIDE 0.06 % NA SOLN
2.0000 | Freq: Four times a day (QID) | NASAL | 0 refills | Status: DC
Start: 2023-06-16 — End: 2023-09-24

## 2023-06-16 MED ORDER — SPIRONOLACTONE 50 MG PO TABS: 50.0000 mg | ORAL_TABLET | Freq: Every day | ORAL | 3 refills | Status: AC

## 2023-06-16 MED ORDER — PANTOPRAZOLE SODIUM 20 MG PO TBEC
20.0000 mg | DELAYED_RELEASE_TABLET | Freq: Every day | ORAL | 0 refills | Status: DC
Start: 2023-06-16 — End: 2023-07-15

## 2023-06-16 NOTE — Patient Instructions (Addendum)
It was a pleasure meeting you today. Thank you for allowing me to take part in your health care.  Our goals for today as we discussed include:  We will get some labs today.  If they are abnormal or we need to do something about them, I will call you.  If they are normal, I will send you a message on MyChart (if it is active) or a letter in the mail.  If you don't hear from [place] in 2 weeks, please call the office at the number below.   Refills sent for requested medications  Please have OB send PAP report when completed  Restart Ipratropium nasal spray for cough Trial Protonix 20 mg at night for 4 weeks  [facility] [address], [place], [place] [address]  Open ? Closes 5?PM Phone: [phone]  This is a list of the screening recommended for you and due dates:  Health Maintenance  Topic Date Due   Medicare Annual Wellness Visit  Never done   COVID-19 Vaccine (8 - 2023-24 season) 04/12/2023   Flu Shot  11/09/2023*   Mammogram  06/24/2023   DTaP/Tdap/Td vaccine (7 - Td or Tdap) 06/05/2026   Colon Cancer Screening  08/09/2029   Hepatitis C Screening  Completed   HIV Screening  Completed   HPV Vaccine  Aged Out  *Topic was postponed. The date shown is not the original due date.    Follow up as needed  If you have any questions or concerns, please do not hesitate to call the office at [phone].  I look forward to our next visit and until then take care and stay safe.  Regards,   Dana Allan, MD   [facility]

## 2023-06-16 NOTE — Progress Notes (Signed)
SUBJECTIVE:   Chief Complaint  Patient presents with   Annual Exam   HPI Presents to clinic for annual physical  Discussed the use of AI scribe software for clinical note transcription with the patient, who gave verbal consent to proceed.  History of Present Illness The patient presented for a routine physical examination. She reported a history of hysterectomy due to recurrent cysts, but expressed confusion about whether the condition was benign or a form of cancer. The patient continues to have annual Pap smears despite the hysterectomy and is scheduled for one next week. She also reported a history of frequent urination and occasional odor, which she attributed to dietary factors.  The patient has been experiencing episodes of feeling feverish, which she has been unable to explain. Her white blood cell count has been elevated on two recent occasions, with the second count higher than the first. The patient reported no recent steroid use that could account for the elevated counts.  The patient also reported excruciating hip pain, which she has been managing with occasional injections for suspected bursitis. She has been experiencing a persistent cough, particularly at night, and described a sensation of something in her throat. The patient has a history of using Flonase and Atrovent inhalers, but is currently out of these prescriptions.  The patient has been managing a condition of Hidradenitis Suppurativa (HS) with spironolactone and clindamycin cream. She reported a recent flare-up of the condition, which resolved after resuming the spironolactone. The patient expressed concern about running out of the spironolactone prescription.  The patient is due for a mammogram next week and her tetanus vaccine is up to date. She is also due for a colonoscopy, for which a referral has been made to a gastroenterologist. She has not yet received the flu vaccine for this year.    Review of Systems -  Negative except listed in HPI    PERTINENT PMH / PSH: As above  OBJECTIVE:  BP 124/76   Pulse 61   Temp 98.4 F (36.9 C)   Resp 16   Ht 6' (1.829 m)   Wt 198 lb 2 oz (89.9 kg)   SpO2 98%   BMI 26.87 kg/m    Physical Exam Vitals reviewed.  Constitutional:      General: She is not in acute distress.    Appearance: She is not ill-appearing.  HENT:     Head: Normocephalic.     Right Ear: Tympanic membrane, ear canal and external ear normal.     Left Ear: Tympanic membrane, ear canal and external ear normal.     Nose: Nose normal.     Mouth/Throat:     Mouth: Mucous membranes are moist.  Eyes:     Extraocular Movements: Extraocular movements intact.     Conjunctiva/sclera: Conjunctivae normal.     Pupils: Pupils are equal, round, and reactive to light.  Neck:     Thyroid: No thyromegaly or thyroid tenderness.     Vascular: No carotid bruit.  Cardiovascular:     Rate and Rhythm: Normal rate and regular rhythm.     Pulses: Normal pulses.     Heart sounds: Normal heart sounds.  Pulmonary:     Effort: Pulmonary effort is normal.     Breath sounds: Normal breath sounds.  Abdominal:     General: Bowel sounds are normal. There is no distension.     Palpations: Abdomen is soft.     Tenderness: There is no abdominal tenderness. There is no  right CVA tenderness, left CVA tenderness, guarding or rebound.  Musculoskeletal:        General: Normal range of motion.     Cervical back: Normal range of motion.     Right lower leg: No edema.     Left lower leg: No edema.  Lymphadenopathy:     Cervical: No cervical adenopathy.  Skin:    Capillary Refill: Capillary refill takes less than 2 seconds.  Neurological:     General: No focal deficit present.     Mental Status: She is alert and oriented to person, place, and time. Mental status is at baseline.     Motor: No weakness.  Psychiatric:        Mood and Affect: Mood normal.        Behavior: Behavior normal.        Thought  Content: Thought content normal.        Judgment: Judgment normal.        06/16/2023    8:39 AM 06/04/2023   10:47 AM 05/14/2023   11:05 AM 05/27/2022   10:18 AM 05/14/2022   10:23 AM  Depression screen PHQ 2/9  Decreased Interest 0 0 0 0 0  Down, Depressed, Hopeless 0 0 0 0 0  PHQ - 2 Score 0 0 0 0 0  Altered sleeping 1 1 3     Tired, decreased energy 0 0 1    Change in appetite 1 1 2     Feeling bad or failure about yourself  0 0 0    Trouble concentrating 1 1 0    Moving slowly or fidgety/restless 0 0 0    Suicidal thoughts 0 0 0    PHQ-9 Score 3 3 6     Difficult doing work/chores Somewhat difficult Not difficult at all Not difficult at all        06/16/2023    8:39 AM 06/04/2023   10:48 AM 05/14/2023   11:06 AM 12/21/2019    2:57 PM  GAD 7 : Generalized Anxiety Score  Nervous, Anxious, on Edge 1 0 0 0  Control/stop worrying 0 1 1 0  Worry too much - different things 0 1 1 0  Trouble relaxing 1 1 1  0  Restless 0 0 0 0  Easily annoyed or irritable 1 0 2 0  Afraid - awful might happen 0 0 0 0  Total GAD 7 Score 3 3 5  0  Anxiety Difficulty Somewhat difficult Not difficult at all Not difficult at all Not difficult at all    ASSESSMENT/PLAN:  Annual physical exam Assessment & Plan: -Mammogram scheduled for next week. -Pap smear not needed due to total hysterectomy. -Tetanus vaccine up to date until 2027. -Colonoscopy up to date, referral to GI for further discussion. -Shingles vaccine recommended for next May. -Flu vaccine to be administered.    Hidradenitis suppurativa Assessment & Plan: Recent flare resolved with use of Clindamycin cream and Spironolactone. -Refill Spironolactone prescription. -Recheck white blood cell count to assess for possible inflammation.  Orders: -     Spironolactone; Take 1 tablet (50 mg total) by mouth daily. In am  Dispense: 90 tablet; Refill: 3  Urination frequency Assessment & Plan: Frequent urination and odor noted. Trace  leukocytes in urine, but not indicative of UTI. Afebrile, no abdominal or flank pain. -Send urine for further evaluation. -If necessary, will treat based on results.  Orders: -     POCT Urinalysis Dipstick (Automated)  Abnormal urinalysis -  Urine Culture  Sinus mucosal thickening -     Ipratropium Bromide; Place 2 sprays into both nostrils 4 (four) times daily.  Dispense: 15 mL; Refill: 0  Leukocytosis, unspecified type Assessment & Plan: Asymptomatic.  Had flare of HS during last check. -Repeat CBC today to ensure levels have normalized.  Orders: -     CBC with Differential/Platelet -     C-reactive protein -     Sedimentation rate  Nocturnal cough Assessment & Plan: No wheezing or lung abnormalities noted. Possible postnasal drainage. -Refill Ipratropium nasal spray and advise patient to trial it first. -If no improvement, trial Protonix for 30 days, taken at bedtime.  Orders: -     Pantoprazole Sodium; Take 1 tablet (20 mg total) by mouth daily.  Dispense: 30 tablet; Refill: 0   PDMP reviewed  Return if symptoms worsen or fail to improve, for PCP.  Dana Allan, MD
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
## 2023-06-17 LAB — URINE CULTURE
MICRO NUMBER:: 15688408
Result:: NO GROWTH
SPECIMEN QUALITY:: ADEQUATE

## 2023-06-20 ENCOUNTER — Encounter: Payer: Self-pay | Admitting: Family Medicine

## 2023-06-20 DIAGNOSIS — R8290 Unspecified abnormal findings in urine: Secondary | ICD-10-CM | POA: Insufficient documentation

## 2023-06-20 DIAGNOSIS — Z0000 Encounter for general adult medical examination without abnormal findings: Secondary | ICD-10-CM | POA: Insufficient documentation

## 2023-06-20 DIAGNOSIS — R350 Frequency of micturition: Secondary | ICD-10-CM | POA: Insufficient documentation

## 2023-06-20 DIAGNOSIS — R058 Other specified cough: Secondary | ICD-10-CM | POA: Insufficient documentation

## 2023-06-20 NOTE — Assessment & Plan Note (Deleted)
No wheezing or lung abnormalities noted. Possible postnasal drainage. -Refill Ipratropium nasal spray and advise patient to trial it first. -If no improvement, trial Protonix for 30 days, taken at bedtime.

## 2023-06-20 NOTE — Assessment & Plan Note (Signed)
-  Mammogram scheduled for next week. -Pap smear not needed due to total hysterectomy. -Tetanus vaccine up to date until 2027. -Colonoscopy up to date, referral to GI for further discussion. -Shingles vaccine recommended for next May. -Flu vaccine to be administered.

## 2023-06-20 NOTE — Assessment & Plan Note (Signed)
Frequent urination and odor noted. Trace leukocytes in urine, but not indicative of UTI. Afebrile, no abdominal or flank pain. -Send urine for further evaluation. -If necessary, will treat based on results.

## 2023-06-20 NOTE — Assessment & Plan Note (Signed)
Asymptomatic.  Had flare of HS during last check. -Repeat CBC today to ensure levels have normalized.

## 2023-06-20 NOTE — Assessment & Plan Note (Signed)
No wheezing or lung abnormalities noted. Possible postnasal drainage. -Refill Ipratropium nasal spray and advise patient to trial it first. -If no improvement, trial Protonix for 30 days, taken at bedtime.

## 2023-06-20 NOTE — Assessment & Plan Note (Signed)
Recent flare resolved with use of Clindamycin cream and Spironolactone. -Refill Spironolactone prescription. -Recheck white blood cell count to assess for possible inflammation.

## 2023-06-25 DIAGNOSIS — Z1231 Encounter for screening mammogram for malignant neoplasm of breast: Secondary | ICD-10-CM | POA: Diagnosis not present

## 2023-06-25 LAB — HM MAMMOGRAPHY
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
## 2023-06-26 ENCOUNTER — Encounter: Payer: Self-pay | Admitting: Family Medicine

## 2023-06-26 ENCOUNTER — Ambulatory Visit: Payer: BC Managed Care – PPO

## 2023-06-26 DIAGNOSIS — R293 Abnormal posture: Secondary | ICD-10-CM

## 2023-06-26 DIAGNOSIS — M542 Cervicalgia: Secondary | ICD-10-CM | POA: Diagnosis not present

## 2023-06-30 ENCOUNTER — Ambulatory Visit: Payer: BC Managed Care – PPO

## 2023-07-06 ENCOUNTER — Ambulatory Visit
Admission: EM | Admit: 2023-07-06 | Discharge: 2023-07-06 | Disposition: A | Discharge status: Home / Self Care | Payer: 59 | Attending: Family Medicine | Admitting: Family Medicine

## 2023-07-06 DIAGNOSIS — R051 Acute cough: Secondary | ICD-10-CM | POA: Diagnosis not present

## 2023-07-06 DIAGNOSIS — J0100 Acute maxillary sinusitis, unspecified: Secondary | ICD-10-CM | POA: Diagnosis not present

## 2023-07-06 MED ORDER — FLUTICASONE PROPIONATE 50 MCG/ACT NA SUSP: 1.0000 | Freq: Two times a day (BID) | NASAL | 2 refills | Status: DC

## 2023-07-06 MED ORDER — PROMETHAZINE-DM 6.25-15 MG/5ML PO SYRP: 5.0000 mL | ORAL_SOLUTION | Freq: Four times a day (QID) | ORAL | 0 refills | Status: DC | PRN

## 2023-07-06 MED ORDER — AMOXICILLIN-POT CLAVULANATE 875-125 MG PO TABS: 1.0000 | ORAL_TABLET | Freq: Two times a day (BID) | ORAL | 0 refills | Status: DC

## 2023-07-06 MED ORDER — FLUCONAZOLE 150 MG PO TABS: 150.0000 mg | ORAL_TABLET | Freq: Once | ORAL | 0 refills | Status: AC

## 2023-07-06 NOTE — ED Triage Notes (Addendum)
Mom reports eyes burning, cough, nasal congestion fever thick bloody ,mucus fever and headache x 3 weeks

## 2023-07-06 NOTE — ED Provider Notes (Signed)
[facility]    CSN: [id number] Arrival date & time: 07/06/23  1818      History   Chief Complaint No chief complaint on file.   HPI [name] is a 47 y.o. female.   Presenting today with 3-week history of nasal congestion, sinus pain and pressure, intermittent fevers, eye pressure, headache.  Denies chest pain, shortness of breath, abdominal pain, nausea vomiting or diarrhea.  Taking antihistamines, cold and congestion medication with minimal relief.  Daughter sick with similar symptoms.    Past Medical History:  Diagnosis Date   Abdominal pain, epigastric 12/17/2015   Anxiety    pt denies having dx of anxiety   BCC (basal cell carcinoma of skin)    right foot    Bunion, right foot    Chronic cough    Closed fibular fracture 10/2008   Left - Sustained 2/2 fall down stairs (same fall as tibular fracture). S/P internal fixation.   COVID-19    mild back pain, brain fog 01/2021   Duodenitis    EGD (07/14/2011) showing chronic duodenitis, H.pyloir neg   Elevated liver enzymes    Endometriosis    S/P TAH and salpingo-oophorectomy, right   Epigastric pain    Chronic, thought 2/2 gastritis, EGD (07/14/2011) showing chronic duodenitis, H.pyloir neg // Repeat EGD (09/2011) - esophagealring, sessible polyp in stomach body, hiatal hernia - rec ad probiotic, await bx   Esophageal ring    s/p dilatation during EGD (07/2011) - Dr. 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 With chronic OA of hip   History of migraine headaches    Typical symptoms - bright lights, unilateral (starting behind her left ear), throbbing .   Hot flashes    Hypertension    Internal hemorrhoids    Osteoarthritis of hip    Ovarian  cancer (HCC) 1995   Protrusio acetabuli    diagnosed at age 72   Sessile colonic polyp    noted 09/2011 -- > rec colonoscopy in 10 years, 2023   Surgical menopause 08/2007   On HRT. Occuring since 01/09 following TAH and R-salpingo-oophorectomy    Tibial fracture 10/2008   Left - Sustained 2/2 fall down stairs. Patient is S/P closed internal fixation with Smith Nephew tibial nail locked proximal and distal   Vitamin D deficiency 11/2009   Vit D level 15 in 11/2009    Patient Active Problem List   Diagnosis Date Noted   Nocturnal cough 06/20/2023   Urination frequency 06/20/2023   Abnormal urinalysis 06/20/2023   Annual physical exam 06/20/2023   Leukocytosis 06/07/2023   Post concussion syndrome 05/22/2023   Hyperlipidemia 05/22/2023   Vitamin B 12 deficiency 05/22/2023   Abnormal glucose 05/22/2023   Plantar fasciitis, right 04/23/2022   Hypertrophic scar 01/14/2022   Keratosis pilaris 01/14/2022   Lipoma of right thigh 09/19/2021   Pain of left mastoid 08/07/2021   Sinus mucosal thickening 08/07/2021   Cervical arthritis 08/07/2021   Mass of right thigh 07/08/2021   Encounter for long-term (current) use of high-risk medication 09/12/2020   Itching of ear 07/30/2020   Chronic neck pain 03/22/2020   Prediabetes 12/23/2019   Melasma 12/21/2019   Irritable bowel syndrome with constipation  12/21/2019   Hypothyroidism 11/02/2019   Hypertrophy of nasal turbinates 11/02/2019   Nasal obstruction 11/02/2019   Inflamed epidermoid cyst of skin 10/27/2019   Trochanteric bursitis, left hip 03/17/2019   Abnormal auditory perception of both ears 02/24/2019   Acute otitis externa of right ear 02/24/2019   Concussion with no loss of consciousness 08/12/2018   Acute pain of left shoulder 08/12/2018   Low back pain 08/12/2018   Allergic rhinitis 08/05/2018   Arthritis 07/01/2018   DDD (degenerative disc disease), lumbar 05/21/2018   Thoracic spondylosis 05/21/2018   DDD (degenerative  disc disease), cervical 01/06/2018   Hidradenitis suppurativa 10/11/2017   Chronic frontal sinusitis 10/11/2017   Fatty liver 10/11/2017   Chronic ethmoidal sinusitis 09/13/2017   Photophobia of both eyes 09/13/2017   Abnormal liver enzymes 09/13/2017   History of hysterectomy with bilateral oophorectomy 06/24/2017   Estrogen deficiency 06/24/2017   Overweight (BMI 25.0-29.9) 11/17/2016   Abdominal pain 11/04/2016   Headache 12/17/2015   Chronic constipation 10/12/2012   Osteoarthritis of left hip 03/19/2012   Hx of total hip arthroplasty, bilateral 03/05/2012   Scoliosis 02/27/2012   Hot flashes 02/20/2012   Hiatal hernia 02/20/2012   Chronic pain of both hips 02/20/2012   Fatigue 12/19/2011   Anemia 06/27/2011   Acetabulum intrapelvic protrusion into pelvic region or thigh 05/15/2011   Impacted cerumen 03/06/2011   Hypertension 03/20/2010   Vitamin D deficiency 08/20/2009   Anxiety 05/18/2009   Migraine headache 05/13/2006    Past Surgical History:  Procedure Laterality Date   ABDOMINAL HYSTERECTOMY     CESAREAN SECTION  08/2005   Primary low transverse   COLONOSCOPY  09/22/2011   [id number] polyp in the rectum/Internal hemorrhoids, benign path   ESOPHAGOGASTRODUODENOSCOPY  09/22/2011   UJW:[id number], esophageal/Sessile polyp in the body of the stomach/Hiatal hernia/ABDOMINAL PAIN LIKELY FUNCTIONAL V. POST-INFECTIOUS IBS, path: mild chronic gastritis   FRACTURE SURGERY     Right hip   JOINT REPLACEMENT     right hip   LAPAROSCOPY  10/2000   Operative laparoscopy with lysis of right adnexal adhesions and uterointestinal adhesions   left tib fib     OTHER SURGICAL HISTORY  02/2009   Closed treatment internal fixation, left tibia with Katrinka Blazing and Nephew tibial nail locked proximal and distal   TOTAL ABDOMINAL HYSTERECTOMY W/ BILATERAL SALPINGOOPHORECTOMY  08/2007   for endometriosis. With concurrent right salpingo-oophorectomy  (2009), left oophorectomy (1995)   TUBAL LIGATION   08/2005   Right tubal ligation    OB History   No obstetric history on file.      Home Medications    Prior to Admission medications   Medication Sig Start Date End Date Taking? Authorizing Provider  amoxicillin-clavulanate (AUGMENTIN) 875-125 MG tablet Take 1 tablet by mouth every 12 (twelve) hours. 07/06/23  Yes Particia Nearing, PA-C  fluconazole (DIFLUCAN) 150 MG tablet Take 1 tablet (150 mg total) by mouth once for 1 dose. 07/06/23 07/06/23 Yes Particia Nearing, PA-C  fluticasone [facility]) 50 MCG/ACT nasal spray Place 1 spray into both nostrils 2 (two) times daily. 07/06/23  Yes Particia Nearing, PA-C  promethazine-dextromethorphan (PROMETHAZINE-DM) 6.25-15 MG/5ML syrup Take 5 mLs by mouth 4 (four) times daily as needed. 07/06/23  Yes Particia Nearing, PA-C  amLODipine (NORVASC) 10 MG tablet TAKE 1 TABLET BY MOUTH EVERY DAY 06/02/23   Dana Allan, MD  cetirizine (ZYRTEC) 10 MG tablet Take 1 tablet (10 mg total) by mouth at bedtime. TAKE 1 TABLET (10 MG  TOTAL) BY MOUTH AT BEDTIME AS NEEDED FOR ALLERGIES. 06/15/23   Dana Allan, MD  Cholecalciferol 1.25 MG (50000 UT) capsule Take 1 capsule (50,000 Units total) by mouth once a week. 05/28/23   Dana Allan, MD  clindamycin (CLEOCIN T) 1 % external solution Apply topically daily as needed. 06/04/23   Dana Allan, MD  ibuprofen (ADVIL) 600 MG tablet Take 1 tablet (600 mg total) by mouth every 6 (six) hours as needed. 02/21/23   Sherian Maroon A, PA  ipratropium (ATROVENT) 0.06 % nasal spray Place 2 sprays into both nostrils 4 (four) times daily. 06/16/23   Dana Allan, MD  lubiprostone (AMITIZA) 24 MCG capsule Take 24 mcg by mouth 2 (two) times daily with a meal.    [provider]  pantoprazole (PROTONIX) 20 MG tablet Take 1 tablet (20 mg total) by mouth daily. 06/16/23   Dana Allan, MD  spironolactone (ALDACTONE) 50 MG tablet Take 1 tablet (50 mg total) by mouth daily. In am 06/16/23   Dana Allan, MD   triamcinolone (NASACORT) 55 MCG/ACT AERO nasal inhaler Place 1 spray into the nose 2 (two) times daily. 08/05/22   Particia Nearing, PA-C    Family History Family History  Problem Relation Age of Onset   Other Father        deceased while in service.    Breast cancer Sister 47       remission   Cancer Sister        breast   Lupus Cousin    Other Mother        unknown medical history   Dementia Maternal Grandmother        at old age   Parkinson's disease Maternal Grandfather    Multiple sclerosis Other        uncle    Colon cancer Neg Hx    Liver disease Neg Hx    Migraines Neg Hx     Social History Social History   Tobacco Use   Smoking status: Never   Smokeless tobacco: Never  Vaping Use   Vaping status: Never Used  Substance Use Topics   Alcohol use: No    Alcohol/week: 0.0 standard drinks of alcohol   Drug use: No     Allergies   Pineapple, Egg-derived products, Hctz [hydrochlorothiazide], Latex, Lisinopril, Morphine and codeine, and Tape   Review of Systems Review of Systems Per HPI  Physical Exam Triage Vital Signs ED Triage Vitals  Encounter Vitals Group     BP 07/06/23 1827 (!) 141/90     Systolic BP Percentile --      Diastolic BP Percentile --      Pulse Rate 07/06/23 1827 60     Resp 07/06/23 1827 18     Temp 07/06/23 1827 98 F (36.7 C)     Temp Source 07/06/23 1827 Oral     SpO2 07/06/23 1827 100 %     Weight --      Height --      Head Circumference --      Peak Flow --      Pain Score 07/06/23 1832 10     Pain Loc --      Pain Education --      Exclude from Growth Chart --    No data found.  Updated Vital Signs BP (!) 141/90 (BP Location: Right Arm)   Pulse 60   Temp 98 F (36.7 C) (Oral)   Resp 18   SpO2 100%  Visual Acuity Right Eye Distance:   Left Eye Distance:   Bilateral Distance:    Right Eye Near:   Left Eye Near:    Bilateral Near:     Physical Exam Vitals and nursing note reviewed.   Constitutional:      Appearance: Normal appearance.  HENT:     Head: Atraumatic.     Right Ear: Tympanic membrane and external ear normal.     Left Ear: Tympanic membrane and external ear normal.     Nose: Congestion present.     Mouth/Throat:     Mouth: Mucous membranes are moist.     Pharynx: Posterior oropharyngeal erythema present.  Eyes:     Extraocular Movements: Extraocular movements intact.     Conjunctiva/sclera: Conjunctivae normal.  Cardiovascular:     Rate and Rhythm: Normal rate and regular rhythm.     Heart sounds: Normal heart sounds.  Pulmonary:     Effort: Pulmonary effort is normal.     Breath sounds: Normal breath sounds. No wheezing or rales.  Musculoskeletal:        General: Normal range of motion.     Cervical back: Normal range of motion and neck supple.  Skin:    General: Skin is warm and dry.  Neurological:     Mental Status: She is alert and oriented to person, place, and time.  Psychiatric:        Mood and Affect: Mood normal.        Thought Content: Thought content normal.      UC Treatments / Results  Labs (all labs ordered are listed, but only abnormal results are displayed) Labs Reviewed - No data to display  EKG   Radiology No results found.  Procedures Procedures (including critical care time)  Medications Ordered in UC Medications - No data to display  Initial Impression / Assessment and Plan / UC Course  I have reviewed the triage vital signs and the nursing notes.  Pertinent labs & imaging results that were available during my care of the patient were reviewed by me and considered in my medical decision making (see chart for details).     Given duration and worsening course, treat with Augmentin, Phenergan DM, Flonase.  Patient requesting Diflucan for antibiotic related yeast infection.  This was sent as well.  Discussed supportive home care and return precautions.  Final Clinical Impressions(s) / UC Diagnoses   Final  diagnoses:  Acute non-recurrent maxillary sinusitis  Acute cough   Discharge Instructions   None    ED Prescriptions     Medication Sig Dispense Auth. Provider   amoxicillin-clavulanate (AUGMENTIN) 875-125 MG tablet Take 1 tablet by mouth every 12 (twelve) hours. 14 tablet Particia Nearing, [place]   promethazine-dextromethorphan (PROMETHAZINE-DM) 6.25-15 MG/5ML syrup Take 5 mLs by mouth 4 (four) times daily as needed. 100 mL Particia Nearing, PA-C   fluticasone [facility]) 50 MCG/ACT nasal spray Place 1 spray into both nostrils 2 (two) times daily. 16 g Particia Nearing, [place]   fluconazole (DIFLUCAN) 150 MG tablet Take 1 tablet (150 mg total) by mouth once for 1 dose. 1 tablet Particia Nearing, [place]      PDMP not reviewed this encounter.   Particia Nearing, [place] 07/06/23 1916

## 2023-07-07 ENCOUNTER — Ambulatory Visit: Payer: 59

## 2023-07-07 ENCOUNTER — Ambulatory Visit: Payer: BC Managed Care – PPO

## 2023-07-14 ENCOUNTER — Telehealth: Payer: Self-pay

## 2023-07-14 NOTE — Telephone Encounter (Signed)
Per Dana Allan I spoke with pt she wanted to switch gi facility due to [facility] doctors have left the practice and there are just PA's left. Pt would like a dr due to her condition. Please advised with this matter.

## 2023-07-15 ENCOUNTER — Other Ambulatory Visit: Payer: Self-pay | Admitting: Family Medicine

## 2023-07-15 DIAGNOSIS — R058 Other specified cough: Secondary | ICD-10-CM

## 2023-07-24 ENCOUNTER — Encounter: Payer: Self-pay | Admitting: Family Medicine

## 2023-07-24 ENCOUNTER — Ambulatory Visit (INDEPENDENT_AMBULATORY_CARE_PROVIDER_SITE_OTHER): Payer: 59 | Admitting: Family Medicine

## 2023-07-24 ENCOUNTER — Ambulatory Visit
Admission: RE | Admit: 2023-07-24 | Discharge: 2023-07-24 | Disposition: A | Discharge status: Home / Self Care | Payer: 59 | Source: Ambulatory Visit | Attending: Family Medicine | Admitting: Family Medicine

## 2023-07-24 VITALS — BP 124/82 | HR 67 | Temp 98.0°F | Resp 18 | Ht 72.0 in | Wt 193.0 lb

## 2023-07-24 DIAGNOSIS — K5909 Other constipation: Secondary | ICD-10-CM | POA: Diagnosis not present

## 2023-07-24 DIAGNOSIS — R1084 Generalized abdominal pain: Secondary | ICD-10-CM | POA: Insufficient documentation

## 2023-07-24 DIAGNOSIS — D509 Iron deficiency anemia, unspecified: Secondary | ICD-10-CM | POA: Diagnosis not present

## 2023-07-24 LAB — URINALYSIS, ROUTINE W REFLEX MICROSCOPIC
Bilirubin Urine: NEGATIVE
Hgb urine dipstick: NEGATIVE
Ketones, ur: NEGATIVE
Leukocytes,Ua: NEGATIVE
Nitrite: NEGATIVE
RBC / HPF: NONE SEEN (ref 0–?)
Specific Gravity, Urine: 1.02 (ref 1.000–1.030)
Total Protein, Urine: NEGATIVE
Urine Glucose: NEGATIVE
Urobilinogen, UA: 0.2 (ref 0.0–1.0)
pH: 6.5 (ref 5.0–8.0)

## 2023-07-24 LAB — CBC WITH DIFFERENTIAL/PLATELET
Basophils Absolute: 0 10*3/uL (ref 0.0–0.1)
Basophils Relative: 0.3 % (ref 0.0–3.0)
Eosinophils Absolute: 0 10*3/uL (ref 0.0–0.7)
Eosinophils Relative: 0.3 % (ref 0.0–5.0)
HCT: 40 % (ref 36.0–46.0)
Hemoglobin: 13.1 g/dL (ref 12.0–15.0)
Lymphocytes Relative: 18.6 % (ref 12.0–46.0)
Lymphs Abs: 1.7 10*3/uL (ref 0.7–4.0)
MCHC: 32.8 g/dL (ref 30.0–36.0)
MCV: 85.6 fL (ref 78.0–100.0)
Monocytes Absolute: 0.8 10*3/uL (ref 0.1–1.0)
Monocytes Relative: 8.9 % (ref 3.0–12.0)
Neutro Abs: 6.5 10*3/uL (ref 1.4–7.7)
Neutrophils Relative %: 71.9 % (ref 43.0–77.0)
Platelets: 379 10*3/uL (ref 150.0–400.0)
RBC: 4.67 Mil/uL (ref 3.87–5.11)
RDW: 14.4 % (ref 11.5–15.5)
WBC: 9.1 10*3/uL (ref 4.0–10.5)

## 2023-07-24 LAB — COMPREHENSIVE METABOLIC PANEL
ALT: 24 U/L (ref 0–35)
AST: 24 U/L (ref 0–37)
Albumin: 4.5 g/dL (ref 3.5–5.2)
Alkaline Phosphatase: 71 U/L (ref 39–117)
BUN: 13 mg/dL (ref 6–23)
CO2: 28 meq/L (ref 19–32)
Calcium: 10.1 mg/dL (ref 8.4–10.5)
Chloride: 102 meq/L (ref 96–112)
Creatinine, Ser: 1.02 mg/dL (ref 0.40–1.20)
GFR: 64.58 mL/min (ref >60.00)
Glucose, Bld: 84 mg/dL (ref 70–99)
Potassium: 4 meq/L (ref 3.5–5.1)
Sodium: 139 meq/L (ref 135–145)
Total Bilirubin: 1.1 mg/dL (ref 0.2–1.2)
Total Protein: 8 g/dL (ref 6.0–8.3)

## 2023-07-24 LAB — LIPASE: Lipase: 45 U/L (ref 11.0–59.0)

## 2023-07-24 NOTE — Assessment & Plan Note (Addendum)
Chronic abdominal pain that has worsened since Thanksgiving..  Pain worsened with bowel movements and lying down. No hematochezia. Reports oily stools and mucus. No recent weight loss. Abdomen tender on examination. Hemodynamically stable. Differential includes appendicitis, gallbladder disease, diverticulitis, and other GI disorders. -Order stat CT scan of abdomen. -Order blood work. -Has upcoming GI appointment in January.

## 2023-07-24 NOTE — Addendum Note (Signed)
Addended by: Enid Cutter on: 07/24/2023 08:41 AM   Modules accepted: Orders

## 2023-07-24 NOTE — Patient Instructions (Signed)
It was a pleasure meeting you today. Thank you for allowing me to take part in your health care.  Our goals for today as we discussed include:  Will get imaging today  We will get some labs today.  If they are abnormal or we need to do something about them, I will call you.  If they are normal, I will send you a message on MyChart (if it is active) or a letter in the mail.  If you don't hear from [place] in 2 weeks, please call the office at the number below.   Follow up with GI as scheduled   This is a list of the screening recommended for you and due dates:  Health Maintenance  Topic Date Due   Medicare Annual Wellness Visit  Never done   COVID-19 Vaccine (8 - 2024-25 season) 04/12/2023   Flu Shot  11/09/2023*   Mammogram  06/24/2024   DTaP/Tdap/Td vaccine (7 - Td or Tdap) 06/05/2026   Colon Cancer Screening  08/09/2029   Hepatitis C Screening  Completed   HIV Screening  Completed   HPV Vaccine  Aged Out  *Topic was postponed. The date shown is not the original due date.    Follow up as needed  If you have any questions or concerns, please do not hesitate to call the office at [phone].  I look forward to our next visit and until then take care and stay safe.  Regards,   Dana Allan, MD   [facility]

## 2023-07-24 NOTE — Progress Notes (Addendum)
SUBJECTIVE:   Chief Complaint  Patient presents with   Abdominal Pain    Pain worse when she lays down. Its starting to get worse.   HPI Presents for acute visit  Discussed the use of AI scribe software for clinical note transcription with the patient, who gave verbal consent to proceed.  History of Present Illness   The patient presents with a chief complaint of persistent abdominal pain that has been ongoing since before Thanksgiving. The pain is described as constant and intensifies after bowel movements, which the patient finds unusual as they expected relief post-defecation. The pain is so severe that it disrupts their sleep. The patient also reports feeling feverish with a low-grade fever of 99.22F, and experiencing nausea.  The patient has a history of liver problems and is currently on medication for blood pressure. They were previously prescribed Protonix by a GI doctor, which they stopped taking for a period due to concerns about the number of medications they were on. They have since resumed taking Protonix daily.  The patient also reports changes in bowel habits, with stools described as dark, oily, and containing mucus. They have been experiencing constipation and diarrhea, for which Amitiza was prescribed. However, the patient stopped taking Amitiza as it did not alleviate their abdominal pain and they began to experience incontinence.  The patient has a history of a hysterectomy in 2008. They have not had an ultrasound of their abdomen in a while and their last colonoscopy, which was approximately five years ago, was inconclusive due to the patient not being fully cleaned out.  The patient's abdominal pain is so severe that they are seeking an earlier appointment with a GI specialist, as their current appointments are scheduled too far out to provide immediate relief. They have been trying to manage their pain by changing their diet, focusing on raw vegetables, salads, tuna,  and salmon. Despite these changes, the patient reports feeling full and has noticed a slight weight loss.    PERTINENT PMH / PSH: As above  OBJECTIVE:  BP 124/82   Pulse 67   Temp 98 F (36.7 C)   Resp 18   Ht 6' (1.829 m)   Wt 193 lb (87.5 kg)   SpO2 97%   BMI 26.18 kg/m    Physical Exam Vitals reviewed.  Constitutional:      General: She is not in acute distress.    Appearance: Normal appearance. She is normal weight. She is not ill-appearing, toxic-appearing or diaphoretic.  Eyes:     General:        Right eye: No discharge.        Left eye: No discharge.     Conjunctiva/sclera: Conjunctivae normal.  Cardiovascular:     Rate and Rhythm: Normal rate.  Pulmonary:     Effort: Pulmonary effort is normal.  Abdominal:     General: Bowel sounds are decreased. There is no distension.     Palpations: There is no mass.     Tenderness: There is generalized abdominal tenderness. There is no right CVA tenderness or left CVA tenderness.     Hernia: No hernia is present.  Musculoskeletal:        General: Normal range of motion.  Skin:    General: Skin is warm and dry.  Neurological:     General: No focal deficit present.     Mental Status: She is alert and oriented to person, place, and time. Mental status is at baseline.  Psychiatric:  Mood and Affect: Mood normal.        Behavior: Behavior normal.        Thought Content: Thought content normal.        Judgment: Judgment normal.        08/03/2023    9:41 AM 06/16/2023    8:39 AM 06/04/2023   10:47 AM 05/14/2023   11:05 AM 05/27/2022   10:18 AM  Depression screen PHQ 2/9  Decreased Interest 0 0 0 0 0  Down, Depressed, Hopeless 0 0 0 0 0  PHQ - 2 Score 0 0 0 0 0  Altered sleeping 0 1 1 3    Tired, decreased energy 0 0 0 1   Change in appetite 0 1 1 2    Feeling bad or failure about yourself  0 0 0 0   Trouble concentrating 0 1 1 0   Moving slowly or fidgety/restless 0 0 0 0   Suicidal thoughts 0 0 0 0    PHQ-9 Score 0 3 3 6    Difficult doing work/chores Not difficult at all Somewhat difficult Not difficult at all Not difficult at all       08/03/2023    9:42 AM 06/16/2023    8:39 AM 06/04/2023   10:48 AM 05/14/2023   11:06 AM  GAD 7 : Generalized Anxiety Score  Nervous, Anxious, on Edge 0 1 0 0  Control/stop worrying 0 0 1 1  Worry too much - different things 0 0 1 1  Trouble relaxing 0 1 1 1   Restless 0 0 0 0  Easily annoyed or irritable 0 1 0 2  Afraid - awful might happen 0 0 0 0  Total GAD 7 Score 0 3 3 5   Anxiety Difficulty Not difficult at all Somewhat difficult Not difficult at all Not difficult at all    ASSESSMENT/PLAN:  Generalized abdominal pain Assessment & Plan: Chronic abdominal pain that has worsened since Thanksgiving..  Pain worsened with bowel movements and lying down. No hematochezia. Reports oily stools and mucus. No recent weight loss. Abdomen tender on examination. Hemodynamically stable. Differential includes appendicitis, gallbladder disease, diverticulitis, and other GI disorders. -Order stat CT scan of abdomen. -Order blood work. -Has upcoming GI appointment in January.  Orders: -     CT ABDOMEN PELVIS WO CONTRAST; Future -     Comprehensive metabolic panel -     Lipase -     Tissue Transglutaminase Abs,IgG,IgA -     Urinalysis, Routine w reflex microscopic  Chronic constipation Assessment & Plan: Not currently taking Amitiza as prescribed by GI specialist. Reports some relief when previously on medication. -Can address resuming medication with GI   Orders: -     Iron, TIBC and Ferritin Panel -     CBC with Differential/Platelet  Iron deficiency anemia, unspecified iron deficiency anemia type -     Iron, TIBC and Ferritin Panel -     CBC with Differential/Platelet    PDMP reviewed  Return if symptoms worsen or fail to improve, for PCP.  Dana Allan, MD

## 2023-07-24 NOTE — Assessment & Plan Note (Signed)
Not currently taking Amitiza as prescribed by GI specialist. Reports some relief when previously on medication. -Can address resuming medication with GI

## 2023-07-25 LAB — IRON,TIBC AND FERRITIN PANEL
%SAT: 24 % (ref 16–45)
Ferritin: 120 ng/mL (ref 16–232)
Iron: 92 ug/dL (ref 40–190)
TIBC: 390 ug/dL (ref 250–450)

## 2023-07-25 LAB — TISSUE TRANSGLUTAMINASE ABS,IGG,IGA
(tTG) Ab, IgA: <1 U/mL
(tTG) Ab, IgG: 4.2 U/mL

## 2023-07-27 ENCOUNTER — Encounter: Payer: Self-pay | Admitting: Family Medicine

## 2023-07-28 ENCOUNTER — Telehealth: Payer: Self-pay

## 2023-07-28 NOTE — Telephone Encounter (Signed)
Patient states she would like to know the results of her labs and x-ray.  Patient states she would like for [place] to please call her.

## 2023-08-03 ENCOUNTER — Encounter: Payer: Self-pay | Admitting: Family Medicine

## 2023-08-03 ENCOUNTER — Ambulatory Visit (INDEPENDENT_AMBULATORY_CARE_PROVIDER_SITE_OTHER): Payer: 59 | Admitting: Family Medicine

## 2023-08-03 VITALS — BP 130/80 | HR 62 | Temp 98.2°F | Resp 18 | Ht 72.0 in | Wt 193.0 lb

## 2023-08-03 DIAGNOSIS — R1084 Generalized abdominal pain: Secondary | ICD-10-CM | POA: Diagnosis not present

## 2023-08-03 DIAGNOSIS — R2991 Unspecified symptoms and signs involving the musculoskeletal system: Secondary | ICD-10-CM

## 2023-08-03 DIAGNOSIS — R058 Other specified cough: Secondary | ICD-10-CM

## 2023-08-03 NOTE — Patient Instructions (Addendum)
It was a pleasure meeting you today. Thank you for allowing me to take part in your health care.  Our goals for today as we discussed include:  CT abdomen normal Follow up with GI as scheduled  Will follow up with previous Cardiology recommendations concerning Maranoid syndrome.    Will need to obtain your last ECHO for review  This is a list of the screening recommended for you and due dates:  Health Maintenance  Topic Date Due   Medicare Annual Wellness Visit  Never done   COVID-19 Vaccine (8 - 2024-25 season) 04/12/2023   Flu Shot  11/09/2023*   Mammogram  06/24/2024   DTaP/Tdap/Td vaccine (7 - Td or Tdap) 06/05/2026   Colon Cancer Screening  08/09/2029   Hepatitis C Screening  Completed   HIV Screening  Completed   HPV Vaccine  Aged Out  *Topic was postponed. The date shown is not the original due date.    Follow up as needed  If you have any questions or concerns, please do not hesitate to call the office at [phone].  I look forward to our next visit and until then take care and stay safe.  Regards,   Dana Allan, MD   [facility]

## 2023-08-03 NOTE — Progress Notes (Unsigned)
SUBJECTIVE:   Chief Complaint  Patient presents with   Referral    To cardiologist    HPI Presents to clinic for discussion of recent labs and requesting ECHO  Discussed the use of AI scribe software for clinical note transcription with the patient, who gave verbal consent to proceed.  History of Present Illness The patient, previously evaluated for Marfan syndrome at age 47, presents for discussion of recent imaging results and to address concerns raised by her son's pediatric cardiologist. The patient reports a history of joint pain and protrusio acetabuli, which led to the initial Marfan syndrome evaluation. She denies any current cardiac symptoms or known heart issues.  The patient recently underwent a CT scan due to abdominal pain. The scan results were normal, with no evidence of inflammation, mass lesions, or obstructions in the liver, gallbladder, pancreas, spleen, adrenals, urinary tract, stomach, duodenum, small bowel, colon, terminal ileum, appendix, aorta, or reproductive organs. The patient's abdominal pain persists, and she is awaiting a GI consultation.  During a recent appointment for her son, the pediatric cardiologist raised concerns about the patient's potential for Marfan syndrome based on physical characteristics and family history. The patient reports being tall with long limbs, similar to other family members. The patient's son is currently being evaluated for Marfan syndrome due to his height and joint hypermobility. The pediatric cardiologist recommended the patient undergo an echocardiogram, which has led to some confusion and concern for the patient.  The patient denies any current cardiac symptoms or known heart issues. She maintains an active lifestyle, including daily exercise. The patient's most recent EKG was normal. She reports a history of astigmatism but no other eye issues. The patient is currently on Protonix for her abdominal pain, and the dose may be  increased to manage symptoms until the GI consultation.    PERTINENT PMH / PSH: As above  OBJECTIVE:  BP 130/80   Pulse 62   Temp 98.2 F (36.8 C)   Resp 18   Ht 6' (1.829 m)   Wt 193 lb (87.5 kg)   SpO2 99%   BMI 26.18 kg/m    Physical Exam Vitals reviewed.  Constitutional:      General: She is not in acute distress.    Appearance: Normal appearance. She is normal weight. She is not ill-appearing, toxic-appearing or diaphoretic.  Eyes:     General:        Right eye: No discharge.        Left eye: No discharge.     Conjunctiva/sclera: Conjunctivae normal.  Cardiovascular:     Rate and Rhythm: Normal rate and regular rhythm.     Heart sounds: Normal heart sounds.  Pulmonary:     Effort: Pulmonary effort is normal.     Breath sounds: Normal breath sounds.  Musculoskeletal:        General: Normal range of motion.  Skin:    General: Skin is warm and dry.  Neurological:     General: No focal deficit present.     Mental Status: She is alert and oriented to person, place, and time. Mental status is at baseline.  Psychiatric:        Mood and Affect: Mood normal.        Behavior: Behavior normal.        Thought Content: Thought content normal.        Judgment: Judgment normal.        08/03/2023    9:41 AM 06/16/2023  8:39 AM 06/04/2023   10:47 AM 05/14/2023   11:05 AM 05/27/2022   10:18 AM  Depression screen PHQ 2/9  Decreased Interest 0 0 0 0 0  Down, Depressed, Hopeless 0 0 0 0 0  PHQ - 2 Score 0 0 0 0 0  Altered sleeping 0 1 1 3    Tired, decreased energy 0 0 0 1   Change in appetite 0 1 1 2    Feeling bad or failure about yourself  0 0 0 0   Trouble concentrating 0 1 1 0   Moving slowly or fidgety/restless 0 0 0 0   Suicidal thoughts 0 0 0 0   PHQ-9 Score 0 3 3 6    Difficult doing work/chores Not difficult at all Somewhat difficult Not difficult at all Not difficult at all       08/03/2023    9:42 AM 06/16/2023    8:39 AM 06/04/2023   10:48 AM  05/14/2023   11:06 AM  GAD 7 : Generalized Anxiety Score  Nervous, Anxious, on Edge 0 1 0 0  Control/stop worrying 0 0 1 1  Worry too much - different things 0 0 1 1  Trouble relaxing 0 1 1 1   Restless 0 0 0 0  Easily annoyed or irritable 0 1 0 2  Afraid - awful might happen 0 0 0 0  Total GAD 7 Score 0 3 3 5   Anxiety Difficulty Not difficult at all Somewhat difficult Not difficult at all Not difficult at all    ASSESSMENT/PLAN:  Generalized abdominal pain Assessment & Plan: Unexplained abdominal pain with normal CT scan and labs. Pending GI consultation.   -Increase Protonix to 40mg  daily for symptom management until GI consultation.    Orders: -     Pantoprazole Sodium; Take 2 tablets (40 mg total) by mouth daily.  Marfanoid habitus Assessment & Plan: History of evaluation for Marfan syndrome in adolescence, now with concerns raised by pediatric cardiologist for son. No current cardiac symptoms or abnormalities on EKG.  Reviewed documentation from 2010 reveals that there was concern for Marfan's Syndrome at that time, given her skeletal findings (acetabular protrusion, scoliosis, arm span > height).  No consistent findings on ECHO and Cardiology recommended periodic examinations of aorta every 5-10 years. -ECHO -Consider cardiology referral pending ECHO results  Orders: -     ECHOCARDIOGRAM COMPLETE; Future  Nocturnal cough -     Pantoprazole Sodium; Take 2 tablets (40 mg total) by mouth daily.    PDMP reviewed  Return if symptoms worsen or fail to improve, for PCP.  Dana Allan, MD

## 2023-08-06 ENCOUNTER — Encounter: Payer: Self-pay | Admitting: Family Medicine

## 2023-08-06 DIAGNOSIS — R2991 Unspecified symptoms and signs involving the musculoskeletal system: Secondary | ICD-10-CM | POA: Insufficient documentation

## 2023-08-06 MED ORDER — PANTOPRAZOLE SODIUM 20 MG PO TBEC: 40.0000 mg | DELAYED_RELEASE_TABLET | Freq: Every day | ORAL | Status: DC

## 2023-08-06 NOTE — Assessment & Plan Note (Signed)
Unexplained abdominal pain with normal CT scan and labs. Pending GI consultation.   -Increase Protonix to 40mg  daily for symptom management until GI consultation.

## 2023-08-06 NOTE — Assessment & Plan Note (Signed)
History of evaluation for Marfan syndrome in adolescence, now with concerns raised by pediatric cardiologist for son. No current cardiac symptoms or abnormalities on EKG.  Reviewed documentation from 2010 reveals that there was concern for Marfan's Syndrome at that time, given her skeletal findings (acetabular protrusion, scoliosis, arm span > height).  No consistent findings on ECHO and Cardiology recommended periodic examinations of aorta every 5-10 years. -ECHO -Consider cardiology referral pending ECHO results

## 2023-08-07 ENCOUNTER — Ambulatory Visit: Payer: 59 | Admitting: Family Medicine

## 2023-08-11 DIAGNOSIS — K581 Irritable bowel syndrome with constipation: Secondary | ICD-10-CM | POA: Diagnosis not present

## 2023-08-11 DIAGNOSIS — R1084 Generalized abdominal pain: Secondary | ICD-10-CM | POA: Diagnosis not present

## 2023-08-11 DIAGNOSIS — M6289 Other specified disorders of muscle: Secondary | ICD-10-CM | POA: Diagnosis not present

## 2023-08-19 DIAGNOSIS — M26609 Unspecified temporomandibular joint disorder, unspecified side: Secondary | ICD-10-CM | POA: Diagnosis not present

## 2023-08-19 DIAGNOSIS — G43901 Migraine, unspecified, not intractable, with status migrainosus: Secondary | ICD-10-CM | POA: Diagnosis not present

## 2023-08-27 DIAGNOSIS — R1032 Left lower quadrant pain: Secondary | ICD-10-CM | POA: Diagnosis not present

## 2023-08-27 DIAGNOSIS — R1031 Right lower quadrant pain: Secondary | ICD-10-CM | POA: Diagnosis not present

## 2023-09-01 ENCOUNTER — Ambulatory Visit: Payer: 59

## 2023-09-23 ENCOUNTER — Ambulatory Visit
Admission: EM | Admit: 2023-09-23 | Discharge: 2023-09-23 | Disposition: A | Discharge status: Home / Self Care | Payer: 59

## 2023-09-23 DIAGNOSIS — J0100 Acute maxillary sinusitis, unspecified: Secondary | ICD-10-CM | POA: Diagnosis not present

## 2023-09-23 MED ORDER — AMOXICILLIN-POT CLAVULANATE 875-125 MG PO TABS: 1.0000 | ORAL_TABLET | Freq: Two times a day (BID) | ORAL | 0 refills | Status: DC

## 2023-09-23 NOTE — Discharge Instructions (Addendum)
Take the Augmentin as directed.  Follow up with your primary care provider.

## 2023-09-23 NOTE — ED Provider Notes (Signed)
[name]    CSN: [id number] Arrival date & time: 09/23/23  4098      History   Chief Complaint Chief Complaint  Patient presents with   Fever   Sore Throat    HPI [name] is a 48 y.o. female.  Patient presents with 10-day history of congestion, postnasal drip, runny nose, sore throat, mild cough.  She reports intermittent fevers in the last few days.  She has been taking ibuprofen; last taken at 0700 this morning.  No shortness of breath, vomiting, diarrhea.    The history is provided by the patient and medical records.    Past Medical History:  Diagnosis Date   Abdominal pain, epigastric 12/17/2015   Anxiety    pt denies having dx of anxiety   BCC (basal cell carcinoma of skin)    right foot    Bunion, right foot    Chronic cough    Closed fibular fracture 10/2008   Left - Sustained 2/2 fall down stairs (same fall as tibular fracture). S/P internal fixation.   COVID-19    mild back pain, brain fog 01/2021   Duodenitis    EGD (07/14/2011) showing chronic duodenitis, H.pyloir neg   Elevated liver enzymes    Endometriosis    S/P TAH and salpingo-oophorectomy, right   Epigastric pain    Chronic, thought 2/2 gastritis, EGD (07/14/2011) showing chronic duodenitis, H.pyloir neg // Repeat EGD (09/2011) - esophagealring, sessible polyp in stomach body, hiatal hernia - rec ad probiotic, await bx   Esophageal ring    s/p dilatation during EGD (07/2011) - Dr. 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 With chronic OA of hip   History of migraine headaches    Typical symptoms - bright lights, unilateral (starting behind her left ear), throbbing .   Hot flashes    Hypertension    Internal hemorrhoids     Osteoarthritis of hip    Ovarian cancer (HCC) 1995   Protrusio acetabuli    diagnosed at age 38   Sessile colonic polyp    noted 09/2011 -- > rec colonoscopy in 10 years, 2023   Surgical menopause 08/2007   On HRT. Occuring since 01/09 following TAH and R-salpingo-oophorectomy    Tibial fracture 10/2008   Left - Sustained 2/2 fall down stairs. Patient is S/P closed internal fixation with Smith Nephew tibial nail locked proximal and distal   Vitamin D deficiency 11/2009   Vit D level 15 in 11/2009    Patient Active Problem List   Diagnosis Date Noted   Marfanoid habitus 08/06/2023   Nocturnal cough 06/20/2023   Urination frequency 06/20/2023   Abnormal urinalysis 06/20/2023   Annual physical exam 06/20/2023   Leukocytosis 06/07/2023   Post concussion syndrome 05/22/2023   Hyperlipidemia 05/22/2023   Vitamin B 12 deficiency 05/22/2023   Abnormal glucose 05/22/2023   Plantar fasciitis, right 04/23/2022   Hypertrophic scar 01/14/2022   Keratosis pilaris 01/14/2022   Lipoma of right thigh 09/19/2021   Pain of left mastoid 08/07/2021   Sinus mucosal thickening 08/07/2021   Cervical arthritis 08/07/2021   Mass of right thigh 07/08/2021   Encounter for long-term (current) use of high-risk medication 09/12/2020   Itching  of ear 07/30/2020   Chronic neck pain 03/22/2020   Prediabetes 12/23/2019   Melasma 12/21/2019   Irritable bowel syndrome with constipation 12/21/2019   Hypothyroidism 11/02/2019   Hypertrophy of nasal turbinates 11/02/2019   Nasal obstruction 11/02/2019   Inflamed epidermoid cyst of skin 10/27/2019   Trochanteric bursitis, left hip 03/17/2019   Abnormal auditory perception of both ears 02/24/2019   Acute otitis externa of right ear 02/24/2019   Concussion with no loss of consciousness 08/12/2018   Acute pain of left shoulder 08/12/2018   Low back pain 08/12/2018   Allergic rhinitis 08/05/2018   Arthritis 07/01/2018   DDD (degenerative disc disease), lumbar  05/21/2018   Thoracic spondylosis 05/21/2018   DDD (degenerative disc disease), cervical 01/06/2018   Hidradenitis suppurativa 10/11/2017   Chronic frontal sinusitis 10/11/2017   Fatty liver 10/11/2017   Chronic ethmoidal sinusitis 09/13/2017   Photophobia of both eyes 09/13/2017   Abnormal liver enzymes 09/13/2017   History of hysterectomy with bilateral oophorectomy 06/24/2017   Estrogen deficiency 06/24/2017   Overweight (BMI 25.0-29.9) 11/17/2016   Abdominal pain 11/04/2016   Headache 12/17/2015   Chronic constipation 10/12/2012   Osteoarthritis of left hip 03/19/2012   Hx of total hip arthroplasty, bilateral 03/05/2012   Scoliosis 02/27/2012   Hot flashes 02/20/2012   Hiatal hernia 02/20/2012   Chronic pain of both hips 02/20/2012   Fatigue 12/19/2011   Anemia 06/27/2011   Acetabulum intrapelvic protrusion into pelvic region or thigh 05/15/2011   Impacted cerumen 03/06/2011   Hypertension 03/20/2010   Vitamin D deficiency 08/20/2009   Anxiety 05/18/2009   Migraine headache 05/13/2006    Past Surgical History:  Procedure Laterality Date   ABDOMINAL HYSTERECTOMY     CESAREAN SECTION  08/2005   Primary low transverse   COLONOSCOPY  09/22/2011   [id number] polyp in the rectum/Internal hemorrhoids, benign path   ESOPHAGOGASTRODUODENOSCOPY  09/22/2011   [id number], esophageal/Sessile polyp in the body of the stomach/Hiatal hernia/ABDOMINAL PAIN LIKELY FUNCTIONAL V. POST-INFECTIOUS IBS, path: mild chronic gastritis   FRACTURE SURGERY     Right hip   JOINT REPLACEMENT     right hip   LAPAROSCOPY  10/2000   Operative laparoscopy with lysis of right adnexal adhesions and uterointestinal adhesions   left tib fib     OTHER SURGICAL HISTORY  02/2009   Closed treatment internal fixation, left tibia with Katrinka Blazing and Nephew tibial nail locked proximal and distal   TOTAL ABDOMINAL HYSTERECTOMY W/ BILATERAL SALPINGOOPHORECTOMY  08/2007   for endometriosis. With concurrent right  salpingo-oophorectomy  (2009), left oophorectomy (1995)   TUBAL LIGATION  08/2005   Right tubal ligation    OB History   No obstetric history on file.      Home Medications    Prior to Admission medications   Medication Sig Start Date End Date Taking? Authorizing Provider  amoxicillin-clavulanate (AUGMENTIN) 875-125 MG tablet Take 1 tablet by mouth every 12 (twelve) hours. 09/23/23  Yes Mickie Bail, NP  EMGALITY 120 MG/ML SOAJ This is your loading dose. Inject 240mg  (2 pen) once then start 120mg  (1 pen) every 30 days after this injection. 08/19/23  Yes [provider]  amLODipine (NORVASC) 10 MG tablet TAKE 1 TABLET BY MOUTH EVERY DAY 06/02/23   Dana Allan, MD  cetirizine (ZYRTEC) 10 MG tablet Take 1 tablet (10 mg total) by mouth at bedtime. TAKE 1 TABLET (10 MG TOTAL) BY MOUTH AT BEDTIME AS NEEDED FOR ALLERGIES. 06/15/23   Dana Allan, MD  Cholecalciferol  1.25 MG (50000 UT) capsule Take 1 capsule (50,000 Units total) by mouth once a week. 05/28/23   Dana Allan, MD  clindamycin (CLEOCIN T) 1 % external solution Apply topically daily as needed. 06/04/23   Dana Allan, MD  fluticasone (FLONASE) 50 MCG/ACT nasal spray Place 1 spray into both nostrils 2 (two) times daily. 07/06/23   Particia Nearing, PA-C  ibuprofen (ADVIL) 600 MG tablet Take 1 tablet (600 mg total) by mouth every 6 (six) hours as needed. 02/21/23   Sherian Maroon A, PA  ipratropium (ATROVENT) 0.06 % nasal spray Place 2 sprays into both nostrils 4 (four) times daily. 06/16/23   Dana Allan, MD  lubiprostone (AMITIZA) 24 MCG capsule Take 24 mcg by mouth 2 (two) times daily with a meal.    [provider]  pantoprazole (PROTONIX) 20 MG tablet Take 2 tablets (40 mg total) by mouth daily. 08/06/23   Dana Allan, MD  spironolactone (ALDACTONE) 50 MG tablet Take 1 tablet (50 mg total) by mouth daily. In am 06/16/23   Dana Allan, MD  triamcinolone (NASACORT) 55 MCG/ACT AERO nasal inhaler Place 1 spray  into the nose 2 (two) times daily. 08/05/22   Particia Nearing, PA-C    Family History Family History  Problem Relation Age of Onset   Other Father        deceased while in service.    Breast cancer Sister 67       remission   Cancer Sister        breast   Lupus Cousin    Other Mother        unknown medical history   Dementia Maternal Grandmother        at old age   Parkinson's disease Maternal Grandfather    Multiple sclerosis Other        uncle    Colon cancer Neg Hx    Liver disease Neg Hx    Migraines Neg Hx     Social History Social History   Tobacco Use   Smoking status: Never   Smokeless tobacco: Never  Vaping Use   Vaping status: Never Used  Substance Use Topics   Alcohol use: No    Alcohol/week: 0.0 standard drinks of alcohol   Drug use: No     Allergies   Pineapple, Egg-derived products, Hctz [hydrochlorothiazide], Latex, Lisinopril, Morphine and codeine, and Tape   Review of Systems Review of Systems  Constitutional:  Positive for chills and fever.  HENT:  Positive for congestion, postnasal drip, rhinorrhea and sore throat.   Respiratory:  Positive for cough. Negative for shortness of breath.   Gastrointestinal:  Negative for diarrhea and vomiting.     Physical Exam Triage Vital Signs ED Triage Vitals  Encounter Vitals Group     BP 09/23/23 0903 134/88     Systolic BP Percentile --      Diastolic BP Percentile --      Pulse Rate 09/23/23 0903 73     Resp 09/23/23 0903 18     Temp 09/23/23 0903 98 F (36.7 C)     Temp src --      SpO2 09/23/23 0903 98 %     Weight --      Height --      Head Circumference --      Peak Flow --      Pain Score 09/23/23 0905 10     Pain Loc --      Pain Education --  Exclude from Growth Chart --    No data found.  Updated Vital Signs BP 134/88   Pulse 73   Temp 98 F (36.7 C)   Resp 18   SpO2 98%   Visual Acuity Right Eye Distance:   Left Eye Distance:   Bilateral Distance:     Right Eye Near:   Left Eye Near:    Bilateral Near:     Physical Exam Constitutional:      General: She is not in acute distress. HENT:     Right Ear: Tympanic membrane normal.     Left Ear: Tympanic membrane normal.     Nose: Rhinorrhea present.     Mouth/Throat:     Mouth: Mucous membranes are moist.     Pharynx: Oropharynx is clear.  Cardiovascular:     Rate and Rhythm: Normal rate and regular rhythm.     Heart sounds: Normal heart sounds.  Pulmonary:     Effort: Pulmonary effort is normal. No respiratory distress.     Breath sounds: Normal breath sounds.  Neurological:     Mental Status: She is alert.      UC Treatments / Results  Labs (all labs ordered are listed, but only abnormal results are displayed) Labs Reviewed - No data to display  EKG   Radiology No results found.  Procedures Procedures (including critical care time)  Medications Ordered in UC Medications - No data to display  Initial Impression / Assessment and Plan / UC Course  I have reviewed the triage vital signs and the nursing notes.  Pertinent labs & imaging results that were available during my care of the patient were reviewed by me and considered in my medical decision making (see chart for details).    Acute sinusitis.  Patient has been symptomatic for 10 days.  Afebrile and vital signs are stable.  Lungs are clear and O2 sat is 98% on room air.  Treating today with Augmentin.  Instructed her to follow-up with her PCP.  Education provided on sinus infection.  She agrees to plan of care.  Final Clinical Impressions(s) / UC Diagnoses   Final diagnoses:  Acute non-recurrent maxillary sinusitis     Discharge Instructions      Take the Augmentin as directed.  Follow-up with your primary care provider.      ED Prescriptions     Medication Sig Dispense Auth. Provider   amoxicillin-clavulanate (AUGMENTIN) 875-125 MG tablet Take 1 tablet by mouth every 12 (twelve) hours. 14  tablet Mickie Bail, NP      PDMP not reviewed this encounter.   Mickie Bail, NP 09/23/23 [phone]

## 2023-09-23 NOTE — ED Triage Notes (Signed)
Patient to Urgent Care with complaints of fevers/ sore throat/ drainage.   Reports symptoms started ten days ago- two weeks. Reports fevers that started on Sunday.   Meds: ibuprofen. Multiple sick contacts at home.

## 2023-09-24 ENCOUNTER — Other Ambulatory Visit: Payer: Self-pay

## 2023-09-29 ENCOUNTER — Encounter: Payer: Self-pay | Admitting: Gastroenterology

## 2023-09-29 ENCOUNTER — Ambulatory Visit (INDEPENDENT_AMBULATORY_CARE_PROVIDER_SITE_OTHER): Payer: 59 | Admitting: Gastroenterology

## 2023-09-29 VITALS — BP 147/84 | HR 70 | Temp 97.5°F | Ht 72.0 in | Wt 195.0 lb

## 2023-09-29 DIAGNOSIS — R140 Abdominal distension (gaseous): Secondary | ICD-10-CM

## 2023-09-29 DIAGNOSIS — K581 Irritable bowel syndrome with constipation: Secondary | ICD-10-CM | POA: Diagnosis not present

## 2023-09-29 DIAGNOSIS — R1030 Lower abdominal pain, unspecified: Secondary | ICD-10-CM

## 2023-09-29 MED ORDER — RIFAXIMIN 550 MG PO TABS
550.0000 mg | ORAL_TABLET | Freq: Three times a day (TID) | ORAL | 0 refills | Status: AC
Start: 2023-09-29 — End: 2023-10-13

## 2023-09-29 NOTE — Patient Instructions (Addendum)
Gave samples of ibsrela 50mg  take 1 tablet twice a day. Let us know how it works and we can call you in a prescription for you.  Try  fermeralted products Kefir, Kimchi, Sauerkraut, Yogurt, Miso, Sourdough bread, Apple cider vinegar.

## 2023-09-29 NOTE — Progress Notes (Unsigned)
Arlyss Repress, MD [address]  [address]  [place], [place] [address]  Main: [phone]  Fax: [phone]    Gastroenterology Consultation  Referring Provider:     Dana Allan, MD Primary Care Physician:  Dana Allan, MD Primary Gastroenterologist:  Dr. Arlyss Repress Reason for Consultation:     ***        HPI:   [name] is a 48 y.o. female referred by Dr. Dana Allan, MD  for consultation & management of ***  NSAIDs: ***  Antiplts/Anticoagulants/Anti thrombotics: ***  GI Procedures: ***  Past Medical History:  Diagnosis Date   Abdominal pain, epigastric 12/17/2015   Anxiety    pt denies having dx of anxiety   BCC (basal cell carcinoma of skin)    right foot    Bunion, right foot    Chronic cough    Closed fibular fracture 10/2008   Left - Sustained 2/2 fall down stairs (same fall as tibular fracture). S/P internal fixation.   COVID-19    mild back pain, brain fog 01/2021   Duodenitis    EGD (07/14/2011) showing chronic duodenitis, H.pyloir neg   Elevated liver enzymes    Endometriosis    S/P TAH and salpingo-oophorectomy, right   Epigastric pain    Chronic, thought 2/2 gastritis, EGD (07/14/2011) showing chronic duodenitis, H.pyloir neg // Repeat EGD (09/2011) - esophagealring, sessible polyp in stomach body, hiatal hernia - rec ad probiotic, await bx   Esophageal ring    s/p dilatation during EGD (07/2011) - Dr. 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 With chronic OA of hip   History of migraine headaches    Typical symptoms - bright lights, unilateral (starting behind her left ear), throbbing .   Hot flashes    Hypertension    Internal hemorrhoids    Osteoarthritis of hip     Ovarian cancer (HCC) 1995   Protrusio acetabuli    diagnosed at age 39   Sessile colonic polyp    noted 09/2011 -- > rec colonoscopy in 10 years, 2023   Surgical menopause 08/2007   On HRT. Occuring since 01/09 following TAH and R-salpingo-oophorectomy    Tibial fracture 10/2008   Left - Sustained 2/2 fall down stairs. Patient is S/P closed internal fixation with Smith Nephew tibial nail locked proximal and distal   Vitamin D deficiency 11/2009   Vit D level 15 in 11/2009    Past Surgical History:  Procedure Laterality Date   ABDOMINAL HYSTERECTOMY     CESAREAN SECTION  08/2005   Primary low transverse   COLONOSCOPY  09/22/2011   [id number] polyp in the rectum/Internal hemorrhoids, benign path   ESOPHAGOGASTRODUODENOSCOPY  09/22/2011   HKV:QQVZ, esophageal/Sessile polyp in the body of the stomach/Hiatal hernia/ABDOMINAL PAIN LIKELY FUNCTIONAL V. POST-INFECTIOUS IBS, path: mild chronic gastritis   FRACTURE SURGERY     Right hip   JOINT REPLACEMENT     right hip   LAPAROSCOPY  10/2000   Operative laparoscopy with lysis of right adnexal adhesions and uterointestinal adhesions   left tib fib     OTHER SURGICAL HISTORY  02/2009   Closed treatment internal fixation, left tibia with Katrinka Blazing and Nephew tibial  nail locked proximal and distal   TOTAL ABDOMINAL HYSTERECTOMY W/ BILATERAL SALPINGOOPHORECTOMY  08/2007   for endometriosis. With concurrent right salpingo-oophorectomy  (2009), left oophorectomy (1995)   TUBAL LIGATION  08/2005   Right tubal ligation    Prior to Admission medications   Medication Sig Start Date End Date Taking? Authorizing Provider  amLODipine (NORVASC) 10 MG tablet TAKE 1 TABLET BY MOUTH EVERY DAY 06/02/23  Yes Dana Allan, MD  cetirizine (ZYRTEC) 10 MG tablet Take 1 tablet (10 mg total) by mouth at bedtime. TAKE 1 TABLET (10 MG TOTAL) BY MOUTH AT BEDTIME AS NEEDED FOR ALLERGIES. 06/15/23  Yes Dana Allan, MD  Cholecalciferol 1.25 MG (50000 UT) capsule Take 1  capsule (50,000 Units total) by mouth once a week. 05/28/23  Yes Dana Allan, MD  clindamycin (CLEOCIN T) 1 % external solution Apply topically daily as needed. 06/04/23  Yes Dana Allan, MD  ibuprofen (ADVIL) 600 MG tablet Take 1 tablet (600 mg total) by mouth every 6 (six) hours as needed. 02/21/23  Yes Sherian Maroon A, PA  lubiprostone (AMITIZA) 24 MCG capsule Take 24 mcg by mouth 2 (two) times daily with a meal.   Yes [provider]  spironolactone (ALDACTONE) 50 MG tablet Take 1 tablet (50 mg total) by mouth daily. In am 06/16/23  Yes Dana Allan, MD  Ubrogepant 50 MG TABS Take 50mg  at onset of headache, repeat in 2hr if headache persists, do not exceed 100mg  in 24hr 08/19/23  Yes [provider]    Family History  Problem Relation Age of Onset   Other Father        deceased while in service.    Breast cancer Sister 36       remission   Cancer Sister        breast   Lupus Cousin    Other Mother        unknown medical history   Dementia Maternal Grandmother        at old age   Parkinson's disease Maternal Grandfather    Multiple sclerosis Other        uncle    Colon cancer Neg Hx    Liver disease Neg Hx    Migraines Neg Hx      Social History   Tobacco Use   Smoking status: Never   Smokeless tobacco: Never  Vaping Use   Vaping status: Never Used  Substance Use Topics   Alcohol use: No    Alcohol/week: 0.0 standard drinks of alcohol   Drug use: No    Allergies as of 09/29/2023 - Review Complete 09/29/2023  Allergen Reaction Noted   Pineapple Shortness Of Breath and Swelling 10/02/2010   Egg-derived products  05/16/2020   Hctz [hydrochlorothiazide]  06/15/2018   Latex Swelling 03/11/2011   Lisinopril  05/21/2018   Morphine and codeine Itching 09/13/2010   Tape Swelling 03/03/2012    Review of Systems:    All systems reviewed and negative except where noted in HPI.   Physical Exam:  BP (!) 147/84 (BP Location: Right Arm, Patient Position:  Sitting, Cuff Size: Normal)   Pulse 70   Temp (!) 97.5 F (36.4 C) (Oral)   Ht 6' (1.829 m)   Wt 195 lb (88.5 kg)   BMI 26.45 kg/m  No LMP recorded. Patient has had a hysterectomy.  General:   Alert,  Well-developed, well-nourished, pleasant and cooperative in NAD Head:  Normocephalic and atraumatic. Eyes:  Sclera clear, no icterus.  Conjunctiva pink. Ears:  Normal auditory acuity. Nose:  No deformity, discharge, or lesions. Mouth:  No deformity or lesions,oropharynx pink & moist. Neck:  Supple; no masses or thyromegaly. Lungs:  Respirations even and unlabored.  Clear throughout to auscultation.   No wheezes, crackles, or rhonchi. No acute distress. Heart:  Regular rate and rhythm; no murmurs, clicks, rubs, or gallops. Abdomen:  Normal bowel sounds. Soft, non-tender and non-distended without masses, hepatosplenomegaly or hernias noted.  No guarding or rebound tenderness.   Rectal: Not performed Msk:  Symmetrical without gross deformities. Good, equal movement & strength bilaterally. Pulses:  Normal pulses noted. Extremities:  No clubbing or edema.  No cyanosis. Neurologic:  Alert and oriented x3;  grossly normal neurologically. Skin:  Intact without significant lesions or rashes. No jaundice. Lymph Nodes:  No significant cervical adenopathy. Psych:  Alert and cooperative. Normal mood and affect.  Imaging Studies: ***  Assessment and Plan:   [name] is a 48 y.o. female with ***   Follow up in ***   Arlyss Repress, MD

## 2023-09-30 ENCOUNTER — Encounter: Payer: Self-pay | Admitting: Gastroenterology

## 2023-10-01 ENCOUNTER — Other Ambulatory Visit: Payer: Self-pay | Admitting: Family Medicine

## 2023-10-21 DIAGNOSIS — M7061 Trochanteric bursitis, right hip: Secondary | ICD-10-CM | POA: Diagnosis not present

## 2023-11-08 ENCOUNTER — Ambulatory Visit
Admission: EM | Admit: 2023-11-08 | Discharge: 2023-11-08 | Attending: Physician Assistant | Admitting: Physician Assistant

## 2023-11-08 DIAGNOSIS — R509 Fever, unspecified: Secondary | ICD-10-CM | POA: Diagnosis not present

## 2023-11-08 DIAGNOSIS — R110 Nausea: Secondary | ICD-10-CM | POA: Diagnosis not present

## 2023-11-08 DIAGNOSIS — R1010 Upper abdominal pain, unspecified: Secondary | ICD-10-CM | POA: Diagnosis not present

## 2023-11-08 LAB — URINALYSIS, W/ REFLEX TO CULTURE (INFECTION SUSPECTED)
Bilirubin Urine: NEGATIVE
Glucose, UA: NEGATIVE mg/dL
Hgb urine dipstick: NEGATIVE
Ketones, ur: NEGATIVE mg/dL
Nitrite: NEGATIVE
Protein, ur: NEGATIVE mg/dL
Specific Gravity, Urine: 1.02 (ref 1.005–1.030)
pH: 6 (ref 5.0–8.0)

## 2023-11-08 NOTE — ED Notes (Signed)
 Patient is being discharged from the Urgent Care and sent to the Emergency Department via PV . Per Shirlee Latch, PA-C , patient is in need of higher level of care due to abdominal pain/flank pain. Patient is aware and verbalizes understanding of plan of care.  Vitals:   11/08/23 1003  BP: 121/85  Pulse: 61  Resp: 19  Temp: 98.3 F (36.8 C)  SpO2: 99%

## 2023-11-08 NOTE — Discharge Instructions (Addendum)
 [facility] [address] [place], [place], [place] [address] Phone: [phone]  You have been advised to follow up immediately in the emergency department for concerning signs.symptoms. If you declined EMS transport, please have a family member take you directly to the ED at this time. Do not delay. Based on concerns about condition, if you do not follow up in th e ED, you may risk poor outcomes including worsening of condition, delayed treatment and potentially life threatening issues. If you have declined to go to the ED at this time, you should call your PCP immediately to set up a follow up appointment.  Go to ED for red flag symptoms, including; fevers you cannot reduce with Tylenol/Motrin, severe headaches, vision changes, numbness/weakness in part of the body, lethargy, confusion, intractable vomiting, severe dehydration, chest pain, breathing difficulty, severe persistent abdominal or pelvic pain, signs of severe infection (increased redness, swelling of an area), feeling faint or passing out, dizziness, etc. You should especially go to the ED for sudden acute worsening of condition if you do not elect to go at this time.

## 2023-11-08 NOTE — ED Triage Notes (Signed)
 Sx x 4 days  Abdominal pain- sharp pain Right Flank pain Nausea Low grade fever  Nausea

## 2023-11-08 NOTE — ED Provider Notes (Signed)
 [facility]    CSN: [id number] Arrival date & time: 11/08/23  0848      History   Chief Complaint Chief Complaint  Patient presents with   Abdominal Pain   Fever   Flank Pain   Nausea    HPI [name] is a 48 y.o. female with history of hypertension, migraines, hiatal hernia, chronic hip pain, elevated liver enzymes/fatty liver, allergies, prediabetes, hypothyroidism and Marfan's.  Presents today for 4-day history of worsening sharp and stabbing upper abdominal pains (10/10), nausea without vomiting, low-grade fever, lower back pain bilaterally.  Reports pain is worse when she eats or drinks anything and her appetite has been significantly reduced.  Denies cough, congestion, chest pain or shortness of breath.  Denies urinary frequency/urgency, hematuria or vaginal complaints.  Has been taking ibuprofen without relief.  Surgical history significant for hysterectomy and orthopedic surgeries.  HPI  Past Medical History:  Diagnosis Date   Abdominal pain, epigastric 12/17/2015   Anxiety    pt denies having dx of anxiety   BCC (basal cell carcinoma of skin)    right foot    Bunion, right foot    Chronic cough    Closed fibular fracture 10/2008   Left - Sustained 2/2 fall down stairs (same fall as tibular fracture). S/P internal fixation.   COVID-19    mild back pain, brain fog 01/2021   Duodenitis    EGD (07/14/2011) showing chronic duodenitis, H.pyloir neg   Elevated liver enzymes    Endometriosis    S/P TAH and salpingo-oophorectomy, right   Epigastric pain    Chronic, thought 2/2 gastritis, EGD (07/14/2011) showing chronic duodenitis, H.pyloir neg // Repeat EGD (09/2011) - esophagealring, sessible polyp in stomach body, hiatal hernia - rec ad probiotic, await bx   Esophageal ring    s/p dilatation during EGD (07/2011) - Dr. Darrick Penna   Fatty liver    Gastritis    Thought due to chronic NSAID use 2/2 pain from her congenital hip abnormality    Headache(784.0)    Migraines   Hidradenitis suppurativa 05/2006   Hip deformity, congenital    Protrusio acetabuli with articular sclerosis and flattening of femoral heads. With chronic OA of hip   History of migraine headaches    Typical symptoms - bright lights, unilateral (starting behind her left ear), throbbing .   Hot flashes    Hypertension    Internal hemorrhoids    Osteoarthritis of hip    Ovarian cancer (HCC) 1995   Protrusio acetabuli    diagnosed at age 19   Sessile colonic polyp    noted 09/2011 -- > rec colonoscopy in 10 years, 2023   Surgical menopause 08/2007   On HRT. Occuring since 01/09 following TAH and R-salpingo-oophorectomy    Tibial fracture 10/2008   Left - Sustained 2/2 fall down stairs. Patient is S/P closed internal fixation with Smith Nephew tibial nail locked proximal and distal   Vitamin D deficiency 11/2009   Vit D level 15 in 11/2009    Patient Active Problem List   Diagnosis Date Noted   Marfanoid habitus 08/06/2023   Nocturnal cough 06/20/2023   Urination frequency 06/20/2023   Abnormal urinalysis 06/20/2023   Annual physical exam 06/20/2023   Leukocytosis 06/07/2023   Post concussion syndrome 05/22/2023   Hyperlipidemia 05/22/2023   Vitamin B 12 deficiency 05/22/2023   Abnormal glucose 05/22/2023   Acanthosis nigricans 01/14/2023   Plantar fasciitis, right 04/23/2022   Hypertrophic scar 01/14/2022  Keratosis pilaris 01/14/2022   Lipoma of right thigh 09/19/2021   Pain of left mastoid 08/07/2021   Sinus mucosal thickening 08/07/2021   Cervical arthritis 08/07/2021   Mass of right thigh 07/08/2021   Encounter for long-term (current) use of high-risk medication 09/12/2020   Itching of ear 07/30/2020   Chronic neck pain 03/22/2020   Prediabetes 12/23/2019   Melasma 12/21/2019   Irritable bowel syndrome with constipation 12/21/2019   Hypothyroidism 11/02/2019   Hypertrophy of nasal turbinates 11/02/2019   Nasal obstruction 11/02/2019    Inflamed epidermoid cyst of skin 10/27/2019   Trochanteric bursitis, left hip 03/17/2019   Abnormal auditory perception of both ears 02/24/2019   Acute otitis externa of right ear 02/24/2019   Concussion with no loss of consciousness 08/12/2018   Acute pain of left shoulder 08/12/2018   Low back pain 08/12/2018   Allergic rhinitis 08/05/2018   Arthritis 07/01/2018   DDD (degenerative disc disease), lumbar 05/21/2018   Thoracic spondylosis 05/21/2018   DDD (degenerative disc disease), cervical 01/06/2018   Hidradenitis suppurativa 10/11/2017   Chronic frontal sinusitis 10/11/2017   Fatty liver 10/11/2017   Chronic ethmoidal sinusitis 09/13/2017   Photophobia of both eyes 09/13/2017   Abnormal liver enzymes 09/13/2017   History of hysterectomy with bilateral oophorectomy 06/24/2017   Estrogen deficiency 06/24/2017   Overweight (BMI 25.0-29.9) 11/17/2016   Abdominal pain 11/04/2016   Headache 12/17/2015   Chronic constipation 10/12/2012   Osteoarthritis of left hip 03/19/2012   Hx of total hip arthroplasty, bilateral 03/05/2012   Scoliosis 02/27/2012   Hot flashes 02/20/2012   Hiatal hernia 02/20/2012   Chronic pain of both hips 02/20/2012   Fatigue 12/19/2011   Anemia 06/27/2011   Acetabulum intrapelvic protrusion into pelvic region or thigh 05/15/2011   Impacted cerumen 03/06/2011   Hypertension 03/20/2010   Vitamin D deficiency 08/20/2009   Anxiety 05/18/2009   Migraine headache 05/13/2006    Past Surgical History:  Procedure Laterality Date   ABDOMINAL HYSTERECTOMY     CESAREAN SECTION  08/2005   Primary low transverse   COLONOSCOPY  09/22/2011   HYQ:MVHQION polyp in the rectum/Internal hemorrhoids, benign path   ESOPHAGOGASTRODUODENOSCOPY  09/22/2011   GEX:BMWU, esophageal/Sessile polyp in the body of the stomach/Hiatal hernia/ABDOMINAL PAIN LIKELY FUNCTIONAL V. POST-INFECTIOUS IBS, path: mild chronic gastritis   FRACTURE SURGERY     Right hip   JOINT REPLACEMENT      right hip   LAPAROSCOPY  10/2000   Operative laparoscopy with lysis of right adnexal adhesions and uterointestinal adhesions   left tib fib     OTHER SURGICAL HISTORY  02/2009   Closed treatment internal fixation, left tibia with Katrinka Blazing and Nephew tibial nail locked proximal and distal   TOTAL ABDOMINAL HYSTERECTOMY W/ BILATERAL SALPINGOOPHORECTOMY  08/2007   for endometriosis. With concurrent right salpingo-oophorectomy  (2009), left oophorectomy (1995)   TUBAL LIGATION  08/2005   Right tubal ligation    OB History   No obstetric history on file.      Home Medications    Prior to Admission medications   Medication Sig Start Date End Date Taking? Authorizing Provider  amLODipine (NORVASC) 10 MG tablet TAKE 1 TABLET BY MOUTH EVERY DAY 06/02/23  Yes Dana Allan, MD  spironolactone (ALDACTONE) 50 MG tablet Take 1 tablet (50 mg total) by mouth daily. In am 06/16/23  Yes Dana Allan, MD  cetirizine (ZYRTEC) 10 MG tablet Take 1 tablet (10 mg total) by mouth at bedtime. TAKE 1 TABLET (10  MG TOTAL) BY MOUTH AT BEDTIME AS NEEDED FOR ALLERGIES. 06/15/23   Dana Allan, MD  Cholecalciferol 1.25 MG (50000 UT) capsule Take 1 capsule (50,000 Units total) by mouth once a week. 05/28/23   Dana Allan, MD  clindamycin (CLEOCIN T) 1 % external solution Apply topically daily as needed. 06/04/23   Dana Allan, MD  ibuprofen (ADVIL) 600 MG tablet Take 1 tablet (600 mg total) by mouth every 6 (six) hours as needed. 02/21/23   Peter Garter, PA  lubiprostone (AMITIZA) 24 MCG capsule Take 24 mcg by mouth 2 (two) times daily with a meal.    [provider]  Ubrogepant 50 MG TABS Take 50mg  at onset of headache, repeat in 2hr if headache persists, do not exceed 100mg  in 24hr 08/19/23   [provider]    Family History Family History  Problem Relation Age of Onset   Other Father        deceased while in service.    Breast cancer Sister 61       remission   Cancer Sister         breast   Lupus Cousin    Other Mother        unknown medical history   Dementia Maternal Grandmother        at old age   Parkinson's disease Maternal Grandfather    Multiple sclerosis Other        uncle    Colon cancer Neg Hx    Liver disease Neg Hx    Migraines Neg Hx     Social History Social History   Tobacco Use   Smoking status: Never   Smokeless tobacco: Never  Vaping Use   Vaping status: Never Used  Substance Use Topics   Alcohol use: No    Alcohol/week: 0.0 standard drinks of alcohol   Drug use: No     Allergies   Pineapple, Egg-derived products, Hctz [hydrochlorothiazide], Latex, Lisinopril, Morphine and codeine, and Tape   Review of Systems Review of Systems  Constitutional:  Positive for appetite change and fatigue.  HENT:  Negative for congestion.   Respiratory:  Negative for cough and shortness of breath.   Cardiovascular:  Negative for chest pain.  Gastrointestinal:  Positive for abdominal pain and nausea. Negative for blood in stool, constipation, diarrhea and vomiting.  Genitourinary:  Positive for flank pain. Negative for difficulty urinating, dysuria, frequency, hematuria, pelvic pain and vaginal discharge.  Musculoskeletal:  Positive for back pain.  Neurological:  Positive for headaches. Negative for dizziness and weakness.     Physical Exam Triage Vital Signs ED Triage Vitals  Encounter Vitals Group     BP 11/08/23 1003 121/85     Systolic BP Percentile --      Diastolic BP Percentile --      Pulse Rate 11/08/23 1003 61     Resp 11/08/23 1003 19     Temp 11/08/23 1003 98.3 F (36.8 C)     Temp Source 11/08/23 1003 Oral     SpO2 11/08/23 1003 99 %     Weight --      Height --      Head Circumference --      Peak Flow --      Pain Score 11/08/23 1002 10     Pain Loc --      Pain Education --      Exclude from Growth Chart --    No data found.  Updated Vital Signs  BP 121/85 (BP Location: Left Arm)   Pulse 61   Temp 98.3 F  (36.8 C) (Oral)   Resp 19   SpO2 99%     Physical Exam Vitals and nursing note reviewed.  Constitutional:      General: She is not in acute distress.    Appearance: Normal appearance. She is not ill-appearing or toxic-appearing.  HENT:     Head: Normocephalic and atraumatic.     Nose: Nose normal.     Mouth/Throat:     Mouth: Mucous membranes are moist.     Pharynx: Oropharynx is clear.  Eyes:     General: No scleral icterus.       Right eye: No discharge.        Left eye: No discharge.     Conjunctiva/sclera: Conjunctivae normal.  Cardiovascular:     Rate and Rhythm: Normal rate and regular rhythm.     Heart sounds: Normal heart sounds.  Pulmonary:     Effort: Pulmonary effort is normal. No respiratory distress.     Breath sounds: Normal breath sounds.  Abdominal:     Palpations: Abdomen is soft.     Tenderness: There is abdominal tenderness in the right upper quadrant, epigastric area and left upper quadrant. There is guarding. There is no right CVA tenderness or left CVA tenderness.     Comments: Guarding RUQ and epigastric region. Patient winces in pain and draws her legs up when I palpate abdomen  Musculoskeletal:     Cervical back: Neck supple.  Skin:    General: Skin is dry.  Neurological:     General: No focal deficit present.     Mental Status: She is alert. Mental status is at baseline.     Motor: No weakness.     Gait: Gait normal.  Psychiatric:        Mood and Affect: Mood normal.        Behavior: Behavior normal.      UC Treatments / Results  Labs (all labs ordered are listed, but only abnormal results are displayed) Labs Reviewed  URINALYSIS, W/ REFLEX TO CULTURE (INFECTION SUSPECTED) - Abnormal; Notable for the following components:      Result Value   Leukocytes,Ua TRACE (*)    Bacteria, UA FEW (*)    All other components within normal limits    EKG   Radiology No results found.  Procedures Procedures (including critical care  time)  Medications Ordered in UC Medications - No data to display  Initial Impression / Assessment and Plan / UC Course  I have reviewed the triage vital signs and the nursing notes.  Pertinent labs & imaging results that were available during my care of the patient were reviewed by me and considered in my medical decision making (see chart for details).   48 year old female with history of hypertension, fatty liver, migraines, anemia, chronic hip pain, allergies, prediabetes and Marfan's presents for 4-day history of worsening upper abdominal pain with nausea, reduced appetite and lower back pain.  Also reports low-grade fever up to 100 degrees.  Vitals are stable and normal at this time.  Reports taking ibuprofen this morning.  She is not ill-appearing or toxic.  Chest clear.  Heart regular rate and rhythm.  No CVA tenderness.  She does have significant tenderness palpation of the upper abdomen especially the right upper quadrant and epigastric region where there is guarding and appears to be in a lot of discomfort when I palpate this area.  Urinalysis not consistent with UTI and she has no urinary symptoms.  High suspicion for possible cholecystitis/cholelithiasis or pancreatitis.  Other possibilities to consider include cirrhosis, liver failure, diverticulitis, gastroenteritis, appendicitis, nephrolithiasis, pyelonephritis, etc.  Encouraged patient to proceed to the emergency department at this time to have labs and imaging performed to evaluate her further.  Patient agrees to proceed to [facility] ED.  Leaving in stable condition.  Declines mass.  Patient understands the risks of not going to ED.  We discussed if she is significantly ill she could have infection that could lead to sepsis.   Final Clinical Impressions(s) / UC Diagnoses   Final diagnoses:  Upper abdominal pain  Fever, unspecified  Nausea     Discharge Instructions      [facility] [address] [place],  [place], [place] [address] Phone: [phone]  You have been advised to follow up immediately in the emergency department for concerning signs.symptoms. If you declined EMS transport, please have a family member take you directly to the ED at this time. Do not delay. Based on concerns about condition, if you do not follow up in th e ED, you may risk poor outcomes including worsening of condition, delayed treatment and potentially life threatening issues. If you have declined to go to the ED at this time, you should call your PCP immediately to set up a follow up appointment.  Go to ED for red flag symptoms, including; fevers you cannot reduce with Tylenol/Motrin, severe headaches, vision changes, numbness/weakness in part of the body, lethargy, confusion, intractable vomiting, severe dehydration, chest pain, breathing difficulty, severe persistent abdominal or pelvic pain, signs of severe infection (increased redness, swelling of an area), feeling faint or passing out, dizziness, etc. You should especially go to the ED for sudden acute worsening of condition if you do not elect to go at this time.     ED Prescriptions   None    PDMP not reviewed this encounter.   Shirlee Latch, PA-C 11/08/23 1428

## 2023-11-09 ENCOUNTER — Encounter: Payer: Self-pay | Admitting: Family Medicine

## 2023-11-09 ENCOUNTER — Other Ambulatory Visit: Payer: Self-pay | Admitting: Family Medicine

## 2023-11-09 ENCOUNTER — Ambulatory Visit (INDEPENDENT_AMBULATORY_CARE_PROVIDER_SITE_OTHER): Admitting: Family Medicine

## 2023-11-09 VITALS — BP 116/84 | HR 60 | Temp 97.6°F | Resp 20 | Ht 72.0 in | Wt 190.1 lb

## 2023-11-09 DIAGNOSIS — R1011 Right upper quadrant pain: Secondary | ICD-10-CM

## 2023-11-09 DIAGNOSIS — G43909 Migraine, unspecified, not intractable, without status migrainosus: Secondary | ICD-10-CM

## 2023-11-09 LAB — CBC WITH DIFFERENTIAL/PLATELET
Basophils Absolute: 0 10*3/uL (ref 0.0–0.1)
Basophils Relative: 0.3 % (ref 0.0–3.0)
Eosinophils Absolute: 0.1 10*3/uL (ref 0.0–0.7)
Eosinophils Relative: 0.7 % (ref 0.0–5.0)
HCT: 41.1 % (ref 36.0–46.0)
Hemoglobin: 13.6 g/dL (ref 12.0–15.0)
Lymphocytes Relative: 21.7 % (ref 12.0–46.0)
Lymphs Abs: 2 10*3/uL (ref 0.7–4.0)
MCHC: 33.1 g/dL (ref 30.0–36.0)
MCV: 83.5 fl (ref 78.0–100.0)
Monocytes Absolute: 1.1 10*3/uL — ABNORMAL HIGH (ref 0.1–1.0)
Monocytes Relative: 11.7 % (ref 3.0–12.0)
Neutro Abs: 6.1 10*3/uL (ref 1.4–7.7)
Neutrophils Relative %: 65.6 % (ref 43.0–77.0)
Platelets: 371 10*3/uL (ref 150.0–400.0)
RBC: 4.93 Mil/uL (ref 3.87–5.11)
RDW: 14.4 % (ref 11.5–15.5)
WBC: 9.3 10*3/uL (ref 4.0–10.5)

## 2023-11-09 LAB — COMPREHENSIVE METABOLIC PANEL WITH GFR
ALT: 26 U/L (ref 0–35)
AST: 22 U/L (ref 0–37)
Albumin: 4.1 g/dL (ref 3.5–5.2)
Alkaline Phosphatase: 81 U/L (ref 39–117)
BUN: 15 mg/dL (ref 6–23)
CO2: 28 meq/L (ref 19–32)
Calcium: 9.4 mg/dL (ref 8.4–10.5)
Chloride: 102 meq/L (ref 96–112)
Creatinine, Ser: 1.01 mg/dL (ref 0.40–1.20)
GFR: 65.22 mL/min (ref >60.00)
Glucose, Bld: 73 mg/dL (ref 70–99)
Potassium: 4.4 meq/L (ref 3.5–5.1)
Sodium: 137 meq/L (ref 135–145)
Total Bilirubin: 0.6 mg/dL (ref 0.2–1.2)
Total Protein: 7.1 g/dL (ref 6.0–8.3)

## 2023-11-09 MED ORDER — NURTEC 75 MG PO TBDP: 75.0000 mg | ORAL_TABLET | Freq: Two times a day (BID) | ORAL | Status: DC | PRN

## 2023-11-09 NOTE — Patient Instructions (Addendum)
 It was a pleasure meeting you today. Thank you for allowing me to take part in your health care.  Our goals for today as we discussed include:  Follow up with GI  If worsening recommend going to [facility] for evaluation  Try Nurtec for migraine    This is a list of the screening recommended for you and due dates:  Health Maintenance  Topic Date Due   Medicare Annual Wellness Visit  Never done   COVID-19 Vaccine (8 - 2024-25 season) 04/12/2023   Flu Shot  11/09/2023*   Mammogram  06/24/2024   DTaP/Tdap/Td vaccine (7 - Td or Tdap) 06/05/2026   Colon Cancer Screening  08/09/2029   Hepatitis C Screening  Completed   HIV Screening  Completed   Pneumococcal Vaccination  Aged Out   HPV Vaccine  Aged Out  *Topic was postponed. The date shown is not the original due date.      If you have any questions or concerns, please do not hesitate to call the office at [phone].  I look forward to our next visit and until then take care and stay safe.  Regards,   Dana Allan, MD   [facility]

## 2023-11-09 NOTE — Progress Notes (Signed)
 Medication Samples have been provided to the patient.  Drug name: Nurtec       Strength: 75 mg        Qty: 1 box LOT: [id number]  Exp.Date: 12/2024  Dosing instructions: take 1 tablet as needed for migraine  The patient has been instructed regarding the correct time, dose, and frequency of taking this medication, including desired effects and most common side effects.   [name] 12:19 PM 11/09/2023

## 2023-11-09 NOTE — Progress Notes (Signed)
 SUBJECTIVE:   Chief Complaint  Patient presents with   Abdominal Pain    Seen GI 2/18 Went to [facility] 11/09/2023 And they advised her to got to ER. From Middle to right side   HPI Presents for acute visit  Discussed the use of AI scribe software for clinical note transcription with the patient, who gave verbal consent to proceed.  History of Present Illness [name] is a 48 year old female who presents with abdominal pain.  She has been experiencing abdominal pain since Wednesday or Thursday of last week. The pain is described as 'stouting sensations' and cramping throughout the abdomen, worsening with eating and drinking. She feels nauseated, with a sensation of fluids coming up, but there is no vomiting or diarrhea. She has been using anti-nausea medication previously prescribed for migraines but has run out.  She completed a course of amoxicillin for an upper respiratory infection and started taking Robaxin, which she initially stopped but resumed due to abdominal pain. Robaxin was effective but caused significant fatigue.  She has a history of migraines and was previously on Imitrex. A new migraine medication was suggested but not started due to pending insurance approval. She had a negative experience during her last visit with a neurologist, feeling the consultation was rushed.  She mentions a possible viral illness and was advised by urgent care to go to the emergency room, which she did not do due to discomfort with the wait time and having had a recent scan. She has not been in contact with anyone sick recently, although her son, whom she has not seen in three weeks, reported feeling sick.  She is concerned about potential dehydration as she is unable to keep fluids down, despite usually being good about hydration. She also mentions a previous suggestion of abdominal migraines, which was not pursued further by her neurologist.    PERTINENT PMH / PSH: As  above  OBJECTIVE:  BP 116/84   Pulse 60   Temp 97.6 F (36.4 C)   Resp 20   Ht 6' (1.829 m)   Wt 190 lb 2 oz (86.2 kg)   SpO2 99%   BMI 25.79 kg/m    Physical Exam Vitals reviewed.  Constitutional:      General: She is not in acute distress.    Appearance: Normal appearance. She is obese. She is not ill-appearing, toxic-appearing or diaphoretic.  Eyes:     General:        Right eye: No discharge.        Left eye: No discharge.     Conjunctiva/sclera: Conjunctivae normal.  Cardiovascular:     Rate and Rhythm: Normal rate.     Heart sounds: Normal heart sounds.  Pulmonary:     Effort: Pulmonary effort is normal.  Abdominal:     General: Bowel sounds are normal.     Tenderness: There is generalized abdominal tenderness.  Musculoskeletal:        General: Normal range of motion.  Skin:    General: Skin is warm and dry.  Neurological:     General: No focal deficit present.     Mental Status: She is alert and oriented to person, place, and time. Mental status is at baseline.  Psychiatric:        Mood and Affect: Mood normal.        Behavior: Behavior normal.        Thought Content: Thought content normal.  Judgment: Judgment normal.           11/09/2023   11:35 AM 08/03/2023    9:41 AM 06/16/2023    8:39 AM 06/04/2023   10:47 AM 05/14/2023   11:05 AM  Depression screen PHQ 2/9  Decreased Interest 0 0 0 0 0  Down, Depressed, Hopeless 0 0 0 0 0  PHQ - 2 Score 0 0 0 0 0  Altered sleeping 0 0 1 1 3   Tired, decreased energy 1 0 0 0 1  Change in appetite 1 0 1 1 2   Feeling bad or failure about yourself  0 0 0 0 0  Trouble concentrating 0 0 1 1 0  Moving slowly or fidgety/restless 0 0 0 0 0  Suicidal thoughts 0 0 0 0 0  PHQ-9 Score 2 0 3 3 6   Difficult doing work/chores Not difficult at all Not difficult at all Somewhat difficult Not difficult at all Not difficult at all      11/09/2023   11:35 AM 08/03/2023    9:42 AM 06/16/2023    8:39 AM 06/04/2023    10:48 AM  GAD 7 : Generalized Anxiety Score  Nervous, Anxious, on Edge 0 0 1 0  Control/stop worrying 0 0 0 1  Worry too much - different things 0 0 0 1  Trouble relaxing 1 0 1 1  Restless 0 0 0 0  Easily annoyed or irritable 0 0 1 0  Afraid - awful might happen 0 0 0 0  Total GAD 7 Score 1 0 3 3  Anxiety Difficulty Not difficult at all Not difficult at all Somewhat difficult Not difficult at all    ASSESSMENT/PLAN:  Right upper quadrant abdominal pain Assessment & Plan: Abdominal pain with cramping and stouting sensations, exacerbated by eating and drinking, suggesting gastrointestinal involvement. Significant nausea present, but no diarrhea or vomiting. Considering abdominal migraines due to migraine history, though uncommon. Previous imaging and tests normal. Advised emergency department evaluation, but hesitant due to wait times. Suggested [facility] for shorter wait times. - Administer Nurtec as a trial for potential abdominal migraines - Order CBC to rule out other causes - Advise considering [facility] emergency department for further evaluation if symptoms persist  Orders: -     CBC with Differential/Platelet -     Comprehensive metabolic panel with GFR  Migraine without status migrainosus, not intractable, unspecified migraine type Assessment & Plan: Migraines previously managed with Imitrex. New medication prescribed but pending insurance approval. Considering abdominal migraines as a cause of current abdominal pain. Nurtec offered as a trial to assess efficacy for potential abdominal migraines. - Administer Nurtec as a trial for potential abdominal migraines - Follow up on insurance approval for new migraine medication  Orders: -     Nurtec; Take 1 tablet (75 mg total) by mouth every 12 (twelve) hours as needed.     PDMP reviewed  Return if symptoms worsen or fail to improve, for PCP.  Dana Allan, MD

## 2023-11-15 ENCOUNTER — Encounter: Payer: Self-pay | Admitting: Family Medicine

## 2023-11-15 NOTE — Assessment & Plan Note (Signed)
 Abdominal pain with cramping and stouting sensations, exacerbated by eating and drinking, suggesting gastrointestinal involvement. Significant nausea present, but no diarrhea or vomiting. Considering abdominal migraines due to migraine history, though uncommon. Previous imaging and tests normal. Advised emergency department evaluation, but hesitant due to wait times. Suggested [facility] for shorter wait times. - Administer Nurtec as a trial for potential abdominal migraines - Order CBC to rule out other causes - Advise considering [facility] emergency department for further evaluation if symptoms persist

## 2023-11-15 NOTE — Assessment & Plan Note (Signed)
 Migraines previously managed with Imitrex. New medication prescribed but pending insurance approval. Considering abdominal migraines as a cause of current abdominal pain. Nurtec offered as a trial to assess efficacy for potential abdominal migraines. - Administer Nurtec as a trial for potential abdominal migraines - Follow up on insurance approval for new migraine medication

## 2023-11-17 ENCOUNTER — Other Ambulatory Visit: Payer: Self-pay | Admitting: Family Medicine

## 2023-11-17 DIAGNOSIS — G43909 Migraine, unspecified, not intractable, without status migrainosus: Secondary | ICD-10-CM

## 2023-11-17 LAB — FOOD ALLERGY PROFILE
Allergen Corn, IgE: <0.1 kU/L
Clam IgE: <0.1 kU/L
Codfish IgE: <0.1 kU/L
Egg White IgE: <0.1 kU/L
Milk IgE: 0.26 kU/L — AB
Peanut IgE: <0.1 kU/L
Scallop IgE: <0.1 kU/L
Sesame Seed IgE: <0.1 kU/L
Shrimp IgE: 5.71 kU/L — AB
Soybean IgE: <0.1 kU/L
Walnut IgE: <0.1 kU/L
Wheat IgE: <0.1 kU/L

## 2023-11-17 LAB — ALPHA-GAL PANEL
Allergen Lamb IgE: <0.1 kU/L
Beef IgE: <0.1 kU/L
IgE (Immunoglobulin E), Serum: 212 [IU]/mL (ref 6–495)
O215-IgE Alpha-Gal: <0.1 kU/L
Pork IgE: <0.1 kU/L

## 2023-11-17 MED ORDER — NURTEC 75 MG PO TBDP: 75.0000 mg | ORAL_TABLET | Freq: Every day | ORAL | 5 refills | Status: AC | PRN

## 2023-11-17 NOTE — Assessment & Plan Note (Signed)
 Ubrevly discontinued Start Nurtec 75 mg daily as needed

## 2023-11-19 ENCOUNTER — Telehealth: Payer: Self-pay

## 2023-11-19 NOTE — Telephone Encounter (Signed)
 The patient called back to get her result.

## 2023-11-19 NOTE — Telephone Encounter (Signed)
-----   Message from [employer] sent at 11/19/2023  8:11 AM EDT ----- Please inform patient that food allergy profile showed intolerance to milk and shrimp which she should avoid at least for 3 months and see if her symptoms improve  RV

## 2023-11-19 NOTE — Telephone Encounter (Signed)
 Called and left a message for call back

## 2023-11-19 NOTE — Telephone Encounter (Signed)
 Patient called back and verbalized understanding of results

## 2023-11-23 ENCOUNTER — Telehealth: Payer: Self-pay

## 2023-12-04 DIAGNOSIS — L2989 Other pruritus: Secondary | ICD-10-CM | POA: Diagnosis not present

## 2023-12-14 ENCOUNTER — Encounter: Payer: Self-pay | Admitting: Gastroenterology

## 2023-12-16 ENCOUNTER — Other Ambulatory Visit: Payer: Self-pay | Admitting: Family Medicine

## 2024-01-05 ENCOUNTER — Encounter: Payer: Self-pay | Admitting: Family Medicine

## 2024-01-05 ENCOUNTER — Ambulatory Visit (INDEPENDENT_AMBULATORY_CARE_PROVIDER_SITE_OTHER): Admitting: Family Medicine

## 2024-01-05 VITALS — BP 116/84 | HR 69 | Temp 98.2°F | Resp 20 | Ht 72.0 in | Wt 186.5 lb

## 2024-01-05 DIAGNOSIS — R252 Cramp and spasm: Secondary | ICD-10-CM

## 2024-01-05 DIAGNOSIS — M2550 Pain in unspecified joint: Secondary | ICD-10-CM

## 2024-01-05 DIAGNOSIS — R768 Other specified abnormal immunological findings in serum: Secondary | ICD-10-CM

## 2024-01-05 DIAGNOSIS — Z96643 Presence of artificial hip joint, bilateral: Secondary | ICD-10-CM | POA: Diagnosis not present

## 2024-01-05 LAB — C-REACTIVE PROTEIN: CRP: <1 mg/dL (ref 0.5–20.0)

## 2024-01-05 LAB — CBC
HCT: 39.8 % (ref 36.0–46.0)
Hemoglobin: 13.4 g/dL (ref 12.0–15.0)
MCHC: 33.6 g/dL (ref 30.0–36.0)
MCV: 83 fl (ref 78.0–100.0)
Platelets: 410 10*3/uL — ABNORMAL HIGH (ref 150.0–400.0)
RBC: 4.8 Mil/uL (ref 3.87–5.11)
RDW: 14.7 % (ref 11.5–15.5)
WBC: 10.4 10*3/uL (ref 4.0–10.5)

## 2024-01-05 LAB — BASIC METABOLIC PANEL WITH GFR
BUN: 14 mg/dL (ref 6–23)
CO2: 28 meq/L (ref 19–32)
Calcium: 10.3 mg/dL (ref 8.4–10.5)
Chloride: 99 meq/L (ref 96–112)
Creatinine, Ser: 0.98 mg/dL (ref 0.40–1.20)
GFR: 67.54 mL/min (ref >60.00)
Glucose, Bld: 90 mg/dL (ref 70–99)
Potassium: 3.7 meq/L (ref 3.5–5.1)
Sodium: 135 meq/L (ref 135–145)

## 2024-01-05 LAB — SEDIMENTATION RATE: Sed Rate: 11 mm/h (ref 0–20)

## 2024-01-05 LAB — CK: Total CK: 89 U/L (ref 7–177)

## 2024-01-05 NOTE — Progress Notes (Signed)
 SUBJECTIVE:   Chief Complaint  Patient presents with   Abdominal Cramping    Lower and goes to back. When she bends its cramps.   Joint Pain    Pain X 2 weeks felt like she could not move   HPI Presents for acute visit  Discussed the use of AI scribe software for clinical note transcription with the patient, who gave verbal consent to proceed.  History of Present Illness [name] is a 48 year old female who presents with cramping and transient paralysis.  She has been experiencing cramping in various areas including her joints, feet, underneath the ribcage, back, and groin for the past two weeks. The cramping occurs in unusual areas, such as the top of her feet, ankle, groin, underneath the rib cage on the right side, and back, and can start with certain movements.   Two days ago, she experienced a sudden episode of paralysis at 2:30 AM, primarily affecting her right side, which lasted for about an hour. During this episode, she had no sensation in her right leg and required assistance from her husband to move. She took a muscle relaxer and regained some mobility after an hour. No associated nausea, vomiting, fever, or loss of bowel or bladder control was noted. Denies abdominal pain.  She has a history of protrusioacetabular and has been experiencing hip pain, for which she receives injections from her orthopedic specialist. Her last injection was in May. She has had bilateral hip replacements and continues to experience bursitis, which affects her mobility.  She is currently taking amlodipine  and spironolactone . No recent nausea, vomiting, fever, or loss of bowel or bladder control. She has not been around anyone sick recently and does not consume alcohol.  She mentions recent stress due to her children's graduations and a missed neurology appointment due to a scheduling error and inclement weather. She has previously seen a psychiatrist for grief counseling after her  mother's passing but is not currently under psychiatric care.     PERTINENT PMH / PSH: As above  OBJECTIVE:  BP 116/84   Pulse 69   Temp 98.2 F (36.8 C)   Resp 20   Ht 6' (1.829 m)   Wt 186 lb 8 oz (84.6 kg)   SpO2 96%   BMI 25.29 kg/m    Physical Exam Vitals reviewed.  Constitutional:      General: She is not in acute distress.    Appearance: Normal appearance. She is normal weight. She is not ill-appearing, toxic-appearing or diaphoretic.  Eyes:     General:        Right eye: No discharge.        Left eye: No discharge.     Conjunctiva/sclera: Conjunctivae normal.  Cardiovascular:     Rate and Rhythm: Normal rate and regular rhythm.     Heart sounds: Normal heart sounds.  Pulmonary:     Effort: Pulmonary effort is normal.     Breath sounds: Normal breath sounds.  Abdominal:     General: Bowel sounds are normal.  Musculoskeletal:        General: No swelling or tenderness. Normal range of motion.  Skin:    General: Skin is warm and dry.     Findings: No rash.  Neurological:     General: No focal deficit present.     Mental Status: She is alert and oriented to person, place, and time. Mental status is at baseline.  Psychiatric:  Mood and Affect: Mood normal.        Behavior: Behavior normal.        Thought Content: Thought content normal.        Judgment: Judgment normal.           01/05/2024   11:12 AM 11/09/2023   11:35 AM 08/03/2023    9:41 AM 06/16/2023    8:39 AM 06/04/2023   10:47 AM  Depression screen PHQ 2/9  Decreased Interest 0 0 0 0 0  Down, Depressed, Hopeless 0 0 0 0 0  PHQ - 2 Score 0 0 0 0 0  Altered sleeping 1 0 0 1 1  Tired, decreased energy 1 1 0 0 0  Change in appetite 1 1 0 1 1  Feeling bad or failure about yourself  0 0 0 0 0  Trouble concentrating 1 0 0 1 1  Moving slowly or fidgety/restless 0 0 0 0 0  Suicidal thoughts 0 0 0 0 0  PHQ-9 Score 4 2 0 3 3  Difficult doing work/chores Not difficult at all Not difficult at  all Not difficult at all Somewhat difficult Not difficult at all      01/05/2024   11:13 AM 11/09/2023   11:35 AM 08/03/2023    9:42 AM 06/16/2023    8:39 AM  GAD 7 : Generalized Anxiety Score  Nervous, Anxious, on Edge 0 0 0 1  Control/stop worrying 0 0 0 0  Worry too much - different things 1 0 0 0  Trouble relaxing 0 1 0 1  Restless 1 0 0 0  Easily annoyed or irritable 1 0 0 1  Afraid - awful might happen 0 0 0 0  Total GAD 7 Score 3 1 0 3  Anxiety Difficulty Somewhat difficult Not difficult at all Not difficult at all Somewhat difficult    ASSESSMENT/PLAN:  Polyarthralgia Assessment & Plan: Intermittent cramping and joint pain in feet, groin, ribcage, and back for two weeks. Recent transient right leg paralysis resolved after an hour. Differential includes dehydration, stress-related muscle tension, or orthopedic issues. No signs of stroke or systemic illness. Recent stress may contribute. - Order blood work to rule out dehydration and other causes. - Recent x-rays for orthopedic assessment. - Advise follow-up with neurology and orthopedics. - Encourage earlier neurology appointment.  Orders: -     C-reactive protein -     ANA -     Sedimentation rate -     Rheumatoid factor -     CBC -     Anti-nuclear ab-titer (ANA titer)  Muscle cramps -     Basic metabolic panel with GFR -     C-reactive protein -     CK  Hx of total hip arthroplasty, bilateral Assessment & Plan: Bilateral hip replacement with ongoing management. Recent right leg paralysis may relate to orthopedic issues. - Advise follow-up with orthopedics in July.      PDMP reviewed  Return if symptoms worsen or fail to improve, for PCP.  Valli Gaw, MD

## 2024-01-05 NOTE — Patient Instructions (Addendum)
 It was a pleasure meeting you today. Thank you for allowing me to take part in your health care.  Our goals for today as we discussed include:  We will get some labs today.  If they are abnormal or we need to do something about them, I will call you.  If they are normal, I will send you a message on MyChart (if it is active) or a letter in the mail.  If you don't hear from us  in 2 weeks, please call the office at the number below.   Schedule appointment with Orthopedics and Neurology as soon as possible  Follow up with GI   This is a list of the screening recommended for you and due dates:  Health Maintenance  Topic Date Due   Medicare Annual Wellness Visit  Never done   COVID-19 Vaccine (8 - 2024-25 season) 04/12/2023   Flu Shot  03/11/2024   Mammogram  06/24/2024   Colon Cancer Screening  08/09/2029   DTaP/Tdap/Td vaccine (8 - Td or Tdap) 11/19/2033   Hepatitis C Screening  Completed   HIV Screening  Completed   Pneumococcal Vaccination  Aged Out   HPV Vaccine  Aged Out   Meningitis B Vaccine  Aged Out      If you have any questions or concerns, please do not hesitate to call the office at [phone].  I look forward to our next visit and until then take care and stay safe.  Regards,   Valli Gaw, MD   [facility]

## 2024-01-07 ENCOUNTER — Telehealth: Payer: Self-pay

## 2024-01-07 LAB — ANA: Anti Nuclear Antibody (ANA): POSITIVE — AB

## 2024-01-07 LAB — ANTI-NUCLEAR AB-TITER (ANA TITER): ANA Titer 1: 1:80 {titer} — ABNORMAL HIGH

## 2024-01-07 LAB — RHEUMATOID FACTOR: Rheumatoid fact SerPl-aCnc: <10 [IU]/mL (ref <14)

## 2024-01-07 NOTE — Telephone Encounter (Signed)
 Will call pt with results when provider has a chance to look at the labs.

## 2024-01-07 NOTE — Telephone Encounter (Signed)
 Copied from CRM [id number]. Topic: Clinical - Lab/Test Results >> Jan 07, 2024  3:19 PM Peg Bouton F wrote: Reason for CRM: Pt would like to go over lab results that the findings were abnormal. She would like some clarification so she understands what she needs to do next.  Patient callback is [phone] (Cell)

## 2024-01-08 ENCOUNTER — Ambulatory Visit: Payer: Self-pay | Admitting: Family Medicine

## 2024-01-08 NOTE — Telephone Encounter (Signed)
 See lab results.

## 2024-01-10 ENCOUNTER — Encounter: Payer: Self-pay | Admitting: Family Medicine

## 2024-01-10 DIAGNOSIS — M2550 Pain in unspecified joint: Secondary | ICD-10-CM | POA: Insufficient documentation

## 2024-01-10 DIAGNOSIS — R252 Cramp and spasm: Secondary | ICD-10-CM | POA: Insufficient documentation

## 2024-01-10 NOTE — Assessment & Plan Note (Signed)
 Intermittent cramping and joint pain in feet, groin, ribcage, and back for two weeks. Recent transient right leg paralysis resolved after an hour. Differential includes dehydration, stress-related muscle tension, or orthopedic issues. No signs of stroke or systemic illness. Recent stress may contribute. - Order blood work to rule out dehydration and other causes. - Recent x-rays for orthopedic assessment. - Advise follow-up with neurology and orthopedics. - Encourage earlier neurology appointment.

## 2024-01-10 NOTE — Assessment & Plan Note (Signed)
 Bilateral hip replacement with ongoing management. Recent right leg paralysis may relate to orthopedic issues. - Advise follow-up with orthopedics in July.

## 2024-01-11 NOTE — Addendum Note (Signed)
 Addended by: Barb Levers on: 01/11/2024 10:10 AM   Modules accepted: Orders

## 2024-01-18 DIAGNOSIS — D229 Melanocytic nevi, unspecified: Secondary | ICD-10-CM | POA: Diagnosis not present

## 2024-01-18 DIAGNOSIS — L83 Acanthosis nigricans: Secondary | ICD-10-CM | POA: Diagnosis not present

## 2024-01-18 DIAGNOSIS — L811 Chloasma: Secondary | ICD-10-CM | POA: Diagnosis not present

## 2024-01-18 DIAGNOSIS — L732 Hidradenitis suppurativa: Secondary | ICD-10-CM | POA: Diagnosis not present

## 2024-01-26 DIAGNOSIS — M2011 Hallux valgus (acquired), right foot: Secondary | ICD-10-CM | POA: Diagnosis not present

## 2024-01-26 DIAGNOSIS — M7662 Achilles tendinitis, left leg: Secondary | ICD-10-CM | POA: Diagnosis not present

## 2024-01-26 DIAGNOSIS — M7661 Achilles tendinitis, right leg: Secondary | ICD-10-CM | POA: Diagnosis not present

## 2024-01-26 DIAGNOSIS — M2012 Hallux valgus (acquired), left foot: Secondary | ICD-10-CM | POA: Diagnosis not present

## 2024-02-10 DIAGNOSIS — Z96643 Presence of artificial hip joint, bilateral: Secondary | ICD-10-CM | POA: Diagnosis not present

## 2024-02-10 DIAGNOSIS — M7061 Trochanteric bursitis, right hip: Secondary | ICD-10-CM | POA: Diagnosis not present

## 2024-02-17 DIAGNOSIS — K5904 Chronic idiopathic constipation: Secondary | ICD-10-CM | POA: Diagnosis not present

## 2024-02-17 DIAGNOSIS — R1084 Generalized abdominal pain: Secondary | ICD-10-CM | POA: Diagnosis not present

## 2024-02-17 DIAGNOSIS — K581 Irritable bowel syndrome with constipation: Secondary | ICD-10-CM | POA: Diagnosis not present

## 2024-02-17 DIAGNOSIS — M6289 Other specified disorders of muscle: Secondary | ICD-10-CM | POA: Diagnosis not present

## 2024-03-09 ENCOUNTER — Ambulatory Visit

## 2024-03-10 DIAGNOSIS — L732 Hidradenitis suppurativa: Secondary | ICD-10-CM | POA: Diagnosis not present

## 2024-03-10 DIAGNOSIS — R238 Other skin changes: Secondary | ICD-10-CM | POA: Diagnosis not present

## 2024-04-07 DIAGNOSIS — K581 Irritable bowel syndrome with constipation: Secondary | ICD-10-CM | POA: Diagnosis not present

## 2024-04-13 ENCOUNTER — Ambulatory Visit (INDEPENDENT_AMBULATORY_CARE_PROVIDER_SITE_OTHER)

## 2024-04-13 VITALS — BP 110/80 | HR 74 | Temp 99.0°F | Ht 72.0 in | Wt 183.6 lb

## 2024-04-13 DIAGNOSIS — M2550 Pain in unspecified joint: Secondary | ICD-10-CM

## 2024-04-13 DIAGNOSIS — E559 Vitamin D deficiency, unspecified: Secondary | ICD-10-CM

## 2024-04-13 DIAGNOSIS — R7989 Other specified abnormal findings of blood chemistry: Secondary | ICD-10-CM

## 2024-04-13 DIAGNOSIS — I10 Essential (primary) hypertension: Secondary | ICD-10-CM

## 2024-04-13 DIAGNOSIS — R7303 Prediabetes: Secondary | ICD-10-CM

## 2024-04-13 DIAGNOSIS — L732 Hidradenitis suppurativa: Secondary | ICD-10-CM

## 2024-04-13 DIAGNOSIS — R252 Cramp and spasm: Secondary | ICD-10-CM

## 2024-04-13 DIAGNOSIS — R7309 Other abnormal glucose: Secondary | ICD-10-CM

## 2024-04-13 DIAGNOSIS — R1011 Right upper quadrant pain: Secondary | ICD-10-CM

## 2024-04-13 DIAGNOSIS — E785 Hyperlipidemia, unspecified: Secondary | ICD-10-CM

## 2024-04-13 DIAGNOSIS — G43909 Migraine, unspecified, not intractable, without status migrainosus: Secondary | ICD-10-CM

## 2024-04-13 DIAGNOSIS — E538 Deficiency of other specified B group vitamins: Secondary | ICD-10-CM | POA: Diagnosis not present

## 2024-04-13 DIAGNOSIS — K5909 Other constipation: Secondary | ICD-10-CM

## 2024-04-13 DIAGNOSIS — J301 Allergic rhinitis due to pollen: Secondary | ICD-10-CM

## 2024-04-13 MED ORDER — CETIRIZINE HCL 10 MG PO TABS: 10.0000 mg | ORAL_TABLET | Freq: Every day | ORAL | 3 refills | Status: AC

## 2024-04-13 MED ORDER — AMLODIPINE BESYLATE 10 MG PO TABS
10.0000 mg | ORAL_TABLET | Freq: Every day | ORAL | 3 refills | Status: AC
Start: 2024-04-13 — End: ?

## 2024-04-13 NOTE — Assessment & Plan Note (Signed)
 Chronic, stable on Nurtec prn. Please continue to follow up [facility] or new symptoms arise.

## 2024-04-13 NOTE — Assessment & Plan Note (Signed)
 Established with GI, counseled patient reach out to GI clinic if she does not hear back from them within 1-2 days for follow up.

## 2024-04-13 NOTE — Patient Instructions (Addendum)
 Please reach out to your GI to get medication refill on getting your GI medication refill.  Please reach out to your dermatologist to get refill on Spironolactone  and Doxycycline  refill.  For migraine medication I recommend reaching to Dr. Isidoro for refill on Nurtec.  You can try Diclofenac  1% gel, up to four times a day for pain. You can also try heating pad, cooling pad, gentle stretching, exercise.  I am sending you refill on Amlodipine  10 mg a day for blood pressure and Zyrtec  as needed for allergy .

## 2024-04-13 NOTE — Progress Notes (Signed)
 Established Patient Office Visit   Subjective  Patient ID: [name], female    DOB: 1975/11/15  Age: 48 y.o. MRN: [id number]  Chief Complaint  Patient presents with   Establish Care    She  has a past medical history of Abdominal pain, epigastric (12/17/2015), Anxiety, BCC (basal cell carcinoma of skin), Bunion, right foot, Chronic cough, Closed fibular fracture (10/2008), COVID-19, Duodenitis, Elevated liver enzymes, Endometriosis, Epigastric pain, Esophageal ring, Fatty liver, Gastritis, Headache(784.0), Hidradenitis suppurativa (05/2006), Hip deformity, congenital, History of bilateral hip replacements (06/11/2011), History of hysterectomy with bilateral oophorectomy (06/24/2017), History of migraine headaches, Hot flashes, Hypertension, Internal hemorrhoids, Osteoarthritis of hip, Ovarian cancer (HCC) (1995), Protrusio acetabuli, Sessile colonic polyp, Surgical menopause (08/2007), Tibial fracture (10/2008), and Vitamin D  deficiency (11/2009).  HPI Discussed the use of AI scribe software for clinical note transcription with the patient, who gave verbal consent to proceed.  History of Present Illness [name] is a 48 year old female with IBS, prediabetes who presents to establish care and blood pressure follow up.   She has a h/o HTN treated with Amlodipine  10 mg daily. Generally, her readings are normal, though a recent visit showed elevated levels. No chest pain or leg swelling.   She has a history of migraines, and takes PRN Nurtec for it. She is established with neurologist at [facility].    She experiences ongoing constipation related to her IBS and has had difficulties obtaining her prescribed medication, Tenapanor. Despite a recent GI consultation, she has not received the medication. Communication delays with the GI office contribute to her frustration.   She has a history of hidradenitis suppurativa, with flare-ups under the armpits and groin area. She is under  the care of a dermatologist and has been prescribed spironolactone  and doxycycline .  She follows a Mediterranean diet, avoids dairy due to allergies, and primarily drinks water  and green tea. She has a history of prediabetes and is concerned about her weight loss despite not engaging in rigorous activity. No family history of diabetes.  She experiences joint pain, particularly in the back and groin, and uses heating pads and gentle exercises for relief. She reports tightness in the neck and occasional pain in trigger point areas. She has upcoming rheumatology appointment on 06/13/2024 with Dr. Jeannetta.   She has a h/o low normal B12, low vitamin D , mildly elevated platelets during last lab on 01/05/24. She has a h/o prediabetes and follows a mediterranean like diet. She gets seasonal allergic rhinitis and takes Zyrtec  10 mg prn.   ROS As per HPI    Objective:     BP 110/80 (BP Location: Right Arm, Patient Position: Sitting, Cuff Size: Normal)   Pulse 74   Temp 99 F (37.2 C) (Oral)   Ht 6' (1.829 m)   Wt 183 lb 9.6 oz (83.3 kg)   SpO2 99%   BMI 24.90 kg/m      01/05/2024   11:12 AM 11/09/2023   11:35 AM 08/03/2023    9:41 AM  Depression screen PHQ 2/9  Decreased Interest 0 0 0  Down, Depressed, Hopeless 0 0 0  PHQ - 2 Score 0 0 0  Altered sleeping 1 0 0  Tired, decreased energy 1 1 0  Change in appetite 1 1 0  Feeling bad or failure about yourself  0 0 0  Trouble concentrating 1 0 0  Moving slowly or fidgety/restless 0 0 0  Suicidal thoughts 0 0 0  PHQ-9 Score 4 2  0  Difficult doing work/chores Not difficult at all Not difficult at all Not difficult at all      01/05/2024   11:13 AM 11/09/2023   11:35 AM 08/03/2023    9:42 AM 06/16/2023    8:39 AM  GAD 7 : Generalized Anxiety Score  Nervous, Anxious, on Edge 0 0 0 1  Control/stop worrying 0 0 0 0  Worry too much - different things 1 0 0 0  Trouble relaxing 0 1 0 1  Restless 1 0 0 0  Easily annoyed or irritable 1 0 0 1   Afraid - awful might happen 0 0 0 0  Total GAD 7 Score 3 1 0 3  Anxiety Difficulty Somewhat difficult Not difficult at all Not difficult at all Somewhat difficult      01/05/2024   11:12 AM 11/09/2023   11:35 AM 08/03/2023    9:41 AM  Depression screen PHQ 2/9  Decreased Interest 0 0 0  Down, Depressed, Hopeless 0 0 0  PHQ - 2 Score 0 0 0  Altered sleeping 1 0 0  Tired, decreased energy 1 1 0  Change in appetite 1 1 0  Feeling bad or failure about yourself  0 0 0  Trouble concentrating 1 0 0  Moving slowly or fidgety/restless 0 0 0  Suicidal thoughts 0 0 0  PHQ-9 Score 4 2 0  Difficult doing work/chores Not difficult at all Not difficult at all Not difficult at all      01/05/2024   11:13 AM 11/09/2023   11:35 AM 08/03/2023    9:42 AM 06/16/2023    8:39 AM  GAD 7 : Generalized Anxiety Score  Nervous, Anxious, on Edge 0 0 0 1  Control/stop worrying 0 0 0 0  Worry too much - different things 1 0 0 0  Trouble relaxing 0 1 0 1  Restless 1 0 0 0  Easily annoyed or irritable 1 0 0 1  Afraid - awful might happen 0 0 0 0  Total GAD 7 Score 3 1 0 3  Anxiety Difficulty Somewhat difficult Not difficult at all Not difficult at all Somewhat difficult   SDOH Screenings   Food Insecurity: Low Risk  (10/21/2023)   Received from [facility]  Housing: Low Risk  (10/21/2023)   Received from [facility]  Recent Concern: Housing - Medium Risk (10/21/2023)   Received from [facility]  Transportation Needs: No Transportation Needs (10/21/2023)   Received from [facility]  Utilities: Low Risk  (10/21/2023)   Received from [facility]  Depression (PHQ2-9): Low Risk  (01/05/2024)  Financial Resource Strain: High Risk (05/13/2023)  Physical Activity: Sufficiently Active (05/13/2023)  Social Connections: Unknown (05/13/2023)  Stress: Stress Concern Present (05/13/2023)  Tobacco Use: Low Risk  (04/13/2024)     Physical Exam Constitutional:      General: She is not in acute  distress. HENT:     Head: Normocephalic and atraumatic.     Right Ear: Tympanic membrane normal.     Left Ear: Tympanic membrane normal.     Mouth/Throat:     Mouth: Mucous membranes are moist.  Cardiovascular:     Rate and Rhythm: Normal rate.  Pulmonary:     Effort: Pulmonary effort is normal.     Breath sounds: Normal breath sounds.  Abdominal:     General: Bowel sounds are normal.     Palpations: Abdomen is soft.     Tenderness: There is no guarding.  Musculoskeletal:     Cervical back: Neck supple.     Right lower leg: No edema.     Left lower leg: No edema.  Skin:    General: Skin is warm.  Neurological:     Mental Status: She is alert and oriented to person, place, and time.  Psychiatric:        Mood and Affect: Mood normal.        No results found for any visits on 04/13/24.  The ASCVD Risk score (Arnett DK, et al., 2019) failed to calculate for the following reasons:   Unable to determine if patient is Non-Hispanic African American     Assessment & Plan:   Primary hypertension Assessment & Plan: BP stable on Amlodipine  10 mg daily, continue, check CMP  Orders: -     amLODIPine  Besylate; Take 1 tablet (10 mg total) by mouth daily.  Dispense: 90 tablet; Refill: 3 -     Comprehensive metabolic panel with GFR  Vitamin D  deficiency Assessment & Plan: Repeat vitamin D , recommendations pending result.   Orders: -     VITAMIN D  25 Hydroxy (Vit-D Deficiency, Fractures)  Vitamin B 12 deficiency Assessment & Plan: History of low normal B 12 on 05/14/2023. Repeat B12.   Orders: -     Vitamin B12  Prediabetes Assessment & Plan: Check A1c, continue lifestyle modifications including mediterranean like diet.   Orders: -     Hemoglobin A1c  Elevated platelet count Assessment & Plan: Evident on CBC from 12/2023, repeat CBC today.   Orders: -     CBC  Polyarthralgia Assessment & Plan: Chronic, including hips, lower back, shoulder. H/O mild elevation  in ANA.  Keep upcoming rheumatology appointment with Dr. Jeannetta in November.  Symptomatic treatment: Try Diclofenac  1% gel, up to four times a day for pain. Try heating pad, cooling pad, gentle stretching and aerobic exercise.    Migraine without status migrainosus, not intractable, unspecified migraine type Assessment & Plan: Chronic, stable on Nurtec prn. Please continue to follow up with neurologist Dr. Isidoro at [facility] for medication refill or new symptoms arise.    Seasonal allergic rhinitis due to pollen Assessment & Plan: Stable on PRN Zyrtec  10 mg, continue. Refill sent.   Orders: -     Cetirizine  HCl; Take 1 tablet (10 mg total) by mouth at bedtime. TAKE 1 TABLET (10 MG TOTAL) BY MOUTH AT BEDTIME AS NEEDED FOR ALLERGIES.  Dispense: 90 tablet; Refill: 3  Chronic constipation Assessment & Plan: Established with GI, counseled patient reach out to GI clinic if she does not hear back from them within 1-2 days for follow up.      Return for 06/21/24.   Luke Shade, MD

## 2024-04-13 NOTE — Assessment & Plan Note (Signed)
 Stable on PRN Zyrtec  10 mg, continue. Refill sent.

## 2024-04-13 NOTE — Assessment & Plan Note (Signed)
 Check A1c, continue lifestyle modifications including mediterranean like diet.

## 2024-04-13 NOTE — Assessment & Plan Note (Signed)
 BP stable on Amlodipine  10 mg daily, continue, check CMP

## 2024-04-13 NOTE — Assessment & Plan Note (Signed)
 Evident on CBC from 12/2023, repeat CBC today.

## 2024-04-13 NOTE — Assessment & Plan Note (Signed)
 Chronic, including hips, lower back, shoulder. H/O mild elevation in ANA.  Keep upcoming rheumatology appointment with Dr. Jeannetta in November.  Symptomatic treatment: Try Diclofenac  1% gel, up to four times a day for pain. Try heating pad, cooling pad, gentle stretching and aerobic exercise.

## 2024-04-13 NOTE — Assessment & Plan Note (Signed)
 History of low normal B 12 on 05/14/2023. Repeat B12.

## 2024-04-13 NOTE — Assessment & Plan Note (Signed)
 Repeat vitamin D , recommendations pending result.

## 2024-04-14 ENCOUNTER — Ambulatory Visit: Payer: Self-pay

## 2024-04-14 LAB — COMPREHENSIVE METABOLIC PANEL WITH GFR
ALT: 25 U/L (ref 0–35)
AST: 22 U/L (ref 0–37)
Albumin: 4.5 g/dL (ref 3.5–5.2)
Alkaline Phosphatase: 67 U/L (ref 39–117)
BUN: 13 mg/dL (ref 6–23)
CO2: 26 meq/L (ref 19–32)
Calcium: 9.8 mg/dL (ref 8.4–10.5)
Chloride: 102 meq/L (ref 96–112)
Creatinine, Ser: 0.99 mg/dL (ref 0.40–1.20)
GFR: 66.6 mL/min (ref >60.00)
Glucose, Bld: 86 mg/dL (ref 70–99)
Potassium: 4.2 meq/L (ref 3.5–5.1)
Sodium: 139 meq/L (ref 135–145)
Total Bilirubin: 0.7 mg/dL (ref 0.2–1.2)
Total Protein: 7.7 g/dL (ref 6.0–8.3)

## 2024-04-14 LAB — CBC
HCT: 39.9 % (ref 36.0–46.0)
Hemoglobin: 13.1 g/dL (ref 12.0–15.0)
MCHC: 32.7 g/dL (ref 30.0–36.0)
MCV: 83.9 fl (ref 78.0–100.0)
Platelets: 396 K/uL (ref 150.0–400.0)
RBC: 4.76 Mil/uL (ref 3.87–5.11)
RDW: 14.2 % (ref 11.5–15.5)
WBC: 10.6 K/uL — ABNORMAL HIGH (ref 4.0–10.5)

## 2024-04-14 LAB — HEMOGLOBIN A1C: Hgb A1c MFr Bld: 6.3 % (ref 4.6–6.5)

## 2024-04-14 LAB — VITAMIN D 25 HYDROXY (VIT D DEFICIENCY, FRACTURES): VITD: 34.41 ng/mL (ref 30.00–100.00)

## 2024-04-14 LAB — VITAMIN B12: Vitamin B-12: 222 pg/mL (ref 211–911)

## 2024-04-20 DIAGNOSIS — M7061 Trochanteric bursitis, right hip: Secondary | ICD-10-CM | POA: Diagnosis not present

## 2024-05-07 ENCOUNTER — Ambulatory Visit
Admission: EM | Admit: 2024-05-07 | Discharge: 2024-05-07 | Disposition: A | Discharge status: Home / Self Care | Attending: Emergency Medicine | Admitting: Emergency Medicine

## 2024-05-07 DIAGNOSIS — J069 Acute upper respiratory infection, unspecified: Secondary | ICD-10-CM

## 2024-05-07 MED ORDER — AMOXICILLIN-POT CLAVULANATE 875-125 MG PO TABS: 1.0000 | ORAL_TABLET | Freq: Two times a day (BID) | ORAL | 0 refills | Status: DC

## 2024-05-07 MED ORDER — PROMETHAZINE-DM 6.25-15 MG/5ML PO SYRP: 5.0000 mL | ORAL_SOLUTION | Freq: Every evening | ORAL | 0 refills | Status: AC | PRN

## 2024-05-07 MED ORDER — BENZONATATE 100 MG PO CAPS: 100.0000 mg | ORAL_CAPSULE | Freq: Three times a day (TID) | ORAL | 0 refills | Status: AC

## 2024-05-07 NOTE — Discharge Instructions (Signed)
 You have had new symptoms of the past 2 weeks indicating that there is most likely a virus that entered the body but as it has lingered and persisted without signs of resolution you will be placed on antibiotic  Take Augmentin  twice daily for 7 days  You may use Tessalon  pill every 8 hours as needed to help settle cough, may use cough syrup at bedtime if needed for rest  You can take Tylenol  and/or Ibuprofen  as needed for fever reduction and pain relief.   For cough: honey 1/2 to 1 teaspoon (you can dilute the honey in water  or another fluid).  You can also use guaifenesin and dextromethorphan for cough. You can use a humidifier for chest congestion and cough.  If you don't have a humidifier, you can sit in the bathroom with the hot shower running.      For sore throat: try warm salt water  gargles, cepacol lozenges, throat spray, warm tea or water  with lemon/honey, popsicles or ice, or OTC cold relief medicine for throat discomfort.   For congestion: take a daily anti-histamine like Zyrtec , Claritin, and a oral decongestant, such as pseudoephedrine .  You can also use Flonase  1-2 sprays in each nostril daily.   It is important to stay hydrated: drink plenty of fluids (water , gatorade/powerade/pedialyte, juices, or teas) to keep your throat moisturized and help further relieve irritation/discomfort.  If your symptoms persist despite use of medication please follow-up with your primary doctor for reevaluation as you will most likely need a chest x-ray

## 2024-05-07 NOTE — ED Provider Notes (Signed)
 [name]    CSN: [id number] Arrival date & time: 05/07/24  0909      History   Chief Complaint Chief Complaint  Patient presents with   Cough    HPI [name] is a 48 y.o. female.   Patient presents for evaluation of a nonproductive cough present for 1 year, symptoms worsening over the past 2 weeks as cough has become productive with thick yellow mucus she has began to experience throat irritation and a sensation that something is stuck within the throat.  Has caused several coughing fits making it difficult to catch her breath, symptoms interfering with sleep.  Has felt feverish but did not measure.  Denies shortness of breath or wheezing.  Has attempted nasal spray and ibuprofen .  No known sick contacts.  Denies respiratory history, non-smoker.  Past Medical History:  Diagnosis Date   Abdominal pain, epigastric 12/17/2015   Anxiety    pt denies having dx of anxiety   BCC (basal cell carcinoma of skin)    right foot    Bunion, right foot    Chronic cough    Closed fibular fracture 10/2008   Left - Sustained 2/2 fall down stairs (same fall as tibular fracture). S/P internal fixation.   COVID-19    mild back pain, brain fog 01/2021   Duodenitis    EGD (07/14/2011) showing chronic duodenitis, H.pyloir neg   Elevated liver enzymes    Endometriosis    S/P TAH and salpingo-oophorectomy, right   Epigastric pain    Chronic, thought 2/2 gastritis, EGD (07/14/2011) showing chronic duodenitis, H.pyloir neg // Repeat EGD (09/2011) - esophagealring, sessible polyp in stomach body, hiatal hernia - rec ad probiotic, await bx   Esophageal ring    s/p dilatation during EGD (07/2011) - Dr. 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 With chronic OA of hip   History of bilateral hip replacements 06/11/2011   History of hysterectomy with bilateral oophorectomy 06/24/2017   History of migraine headaches    Typical symptoms - bright lights, unilateral (starting behind her left ear), throbbing .   Hot flashes    Hypertension    Internal hemorrhoids    Osteoarthritis of hip    Ovarian cancer (HCC) 1995   Protrusio acetabuli    diagnosed at age 13   Sessile colonic polyp    noted 09/2011 -- > rec colonoscopy in 10 years, 2023   Surgical menopause 08/2007   On HRT. Occuring since 01/09 following TAH and R-salpingo-oophorectomy    Tibial fracture 10/2008   Left - Sustained 2/2 fall down stairs. 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   Nocturnal cough 06/20/2023   Annual physical exam 06/20/2023   Leukocytosis 06/07/2023   Hyperlipidemia 05/22/2023   Vitamin B 12 deficiency 05/22/2023   Acanthosis nigricans 01/14/2023   TMJ pain dysfunction syndrome 10/23/2022   Plantar fasciitis, right 04/23/2022   Hypertrophic scar 01/14/2022  Keratosis pilaris 01/14/2022   Lipoma of right thigh 09/19/2021   Pain of left mastoid 08/07/2021   Sinus mucosal thickening 08/07/2021   Cervical arthritis 08/07/2021   Mass of right thigh 07/08/2021   Encounter for long-term (current) use of high-risk medication 09/12/2020   Itching of ear 07/30/2020   Chronic neck pain 03/22/2020   Prediabetes 12/23/2019   Melasma 12/21/2019   Irritable bowel syndrome with constipation 12/21/2019   Hypothyroidism 11/02/2019   Hypertrophy of nasal turbinates 11/02/2019   Nasal obstruction 11/02/2019   Inflamed epidermoid cyst of skin 10/27/2019   Trochanteric  bursitis, left hip 03/17/2019   Trochanteric bursitis, right hip 03/17/2019   Abnormal auditory perception of both ears 02/24/2019   Concussion with no loss of consciousness 08/12/2018   Low back pain 08/12/2018   Allergic rhinitis 08/05/2018   Arthritis 07/01/2018   DDD (degenerative disc disease), lumbar 05/21/2018   Thoracic spondylosis 05/21/2018   DDD (degenerative disc disease), cervical 01/06/2018   Hidradenitis suppurativa 10/11/2017   Chronic frontal sinusitis 10/11/2017   Fatty liver 10/11/2017   Chronic ethmoidal sinusitis 09/13/2017   Photophobia of both eyes 09/13/2017   Estrogen deficiency 06/24/2017   Inflamed seborrheic keratosis of left cheek 10/15/2016   Dermatosis papulosa nigra 08/15/2015   Retained orthopedic hardware 12/13/2014   Chronic constipation 10/12/2012   Osteoarthritis of left hip 03/19/2012   Hx of total hip arthroplasty, bilateral 03/05/2012   Scoliosis 02/27/2012   Hot flashes 02/20/2012   Hiatal hernia 02/20/2012   Chronic pain of both hips 02/20/2012   Fatigue 12/19/2011   Acetabulum intrapelvic protrusion into pelvic region or thigh 05/15/2011   Impacted cerumen 03/06/2011   Hypertension 03/20/2010   Vitamin D  deficiency 08/20/2009   Anxiety 05/18/2009   Migraine headache 05/13/2006    Past Surgical History:  Procedure Laterality Date   ABDOMINAL HYSTERECTOMY     CESAREAN SECTION  08/2005   Primary low transverse   COLONOSCOPY  09/22/2011   DOQ:Dzddpoz polyp in the rectum/Internal hemorrhoids, benign path   ESOPHAGOGASTRODUODENOSCOPY  09/22/2011   DOQ:Mpwh, esophageal/Sessile polyp in the body of the stomach/Hiatal hernia/ABDOMINAL PAIN LIKELY FUNCTIONAL V. POST-INFECTIOUS IBS, path: mild chronic gastritis   FRACTURE SURGERY     Right hip   JOINT REPLACEMENT     right hip   LAPAROSCOPY  10/2000   Operative laparoscopy with lysis of right adnexal adhesions and uterointestinal adhesions   left tib fib     OTHER SURGICAL HISTORY   02/2009   Closed treatment internal fixation, left tibia with Claudene and Nephew tibial nail locked proximal and distal   TOTAL ABDOMINAL HYSTERECTOMY W/ BILATERAL SALPINGOOPHORECTOMY  08/2007   for endometriosis. With concurrent right salpingo-oophorectomy  (2009), left oophorectomy (1995)   TUBAL LIGATION  08/2005   Right tubal ligation    OB History   No obstetric history on file.      Home Medications    Prior to Admission medications   Medication Sig Start Date End Date Taking? Authorizing Provider  amLODipine  (NORVASC ) 10 MG tablet Take 1 tablet (10 mg total) by mouth daily. 04/13/24  Yes Bair, Kalpana, MD  amoxicillin -clavulanate (AUGMENTIN ) 875-125 MG tablet Take 1 tablet by mouth every 12 (twelve) hours. 05/07/24  Yes Joannah Gitlin R, NP  benzonatate  (TESSALON ) 100 MG capsule Take 1 capsule (100 mg total) by mouth every 8 (eight) hours. 05/07/24  Yes Yurika Pereda R, NP  pantoprazole  (PROTONIX ) 20 MG tablet Take 20 mg by mouth daily. 12/10/23  Yes [provider]  promethazine -dextromethorphan (PROMETHAZINE -DM) 6.25-15 MG/5ML syrup Take 5 mLs by mouth at bedtime as needed. 05/07/24  Yes Abhiram Criado R, NP  spironolactone  (ALDACTONE ) 50 MG tablet Take 1 tablet (50 mg total) by mouth daily. In am 06/16/23  Yes Hope Merle, MD  cetirizine  (ZYRTEC ) 10 MG tablet Take 1 tablet (10 mg total) by mouth at bedtime. TAKE 1 TABLET (10 MG TOTAL) BY MOUTH AT BEDTIME AS NEEDED FOR ALLERGIES. 04/13/24   Abbey Bruckner, MD  clindamycin  (CLEOCIN  T) 1 % external solution Apply to affected areas daily to twice daily 01/18/24   [provider]  clobetasol ointment (TEMOVATE) 0.05 % Apply topically. 03/10/24   [provider]  doxycycline  (VIBRA -TABS) 100 MG tablet Take 100 mg by mouth. 03/10/24 05/09/24  [provider]  Prucalopride Succinate 2 MG TABS Take 1 tablet by mouth daily. 02/17/24   [provider]  Rimegepant Sulfate (NURTEC) 75 MG TBDP Take 1 tablet (75  mg total) by mouth daily as needed. 11/17/23   Hope Merle, MD  Tenapanor HCl 50 MG TABS Take 50 mg by mouth. 04/07/24   [provider]    Family History Family History  Problem Relation Age of Onset   Other Father        deceased while in service.    Breast cancer Sister 11       remission   Cancer Sister        breast   Lupus Cousin    Other Mother        unknown medical history   Dementia Maternal Grandmother        at old age   Parkinson's disease Maternal Grandfather    Multiple sclerosis Other        uncle    Colon cancer Neg Hx    Liver disease Neg Hx    Migraines Neg Hx     Social History Social History   Tobacco Use   Smoking status: Never   Smokeless tobacco: Never  Vaping Use   Vaping status: Never Used  Substance Use Topics   Alcohol use: No    Alcohol/week: 0.0 standard drinks of alcohol   Drug use: No     Allergies   Pineapple, Egg-derived products, Hctz [hydrochlorothiazide ], Latex, Lisinopril , Morphine and codeine , Shrimp (diagnostic), and Tape   Review of Systems Review of Systems   Physical Exam Triage Vital Signs ED Triage Vitals  Encounter Vitals Group     BP 05/07/24 0944 119/81     Girls Systolic BP Percentile --      Girls Diastolic BP Percentile --      Boys Systolic BP Percentile --      Boys Diastolic BP Percentile --      Pulse Rate 05/07/24 0944 62     Resp 05/07/24 0944 18     Temp 05/07/24 0944 98.7 F (37.1 C)     Temp Source 05/07/24 0944 Oral     SpO2 05/07/24 0944 98 %     Weight --      Height --      Head Circumference --      Peak Flow --      Pain Score 05/07/24 0942 0     Pain Loc --      Pain Education --      Exclude from Growth Chart --    No data found.  Updated Vital Signs BP 119/81 (BP Location: Left Arm)   Pulse 62  Temp 98.7 F (37.1 C) (Oral)   Resp 18   SpO2 98%   Visual Acuity Right Eye Distance:   Left Eye Distance:   Bilateral Distance:    Right Eye Near:   Left Eye  Near:    Bilateral Near:     Physical Exam Constitutional:      Appearance: Normal appearance.  HENT:     Right Ear: Tympanic membrane, ear canal and external ear normal.     Left Ear: Tympanic membrane, ear canal and external ear normal.     Nose: Nose normal.     Mouth/Throat:     Pharynx: Posterior oropharyngeal erythema present. No oropharyngeal exudate.  Cardiovascular:     Rate and Rhythm: Normal rate and regular rhythm.     Pulses: Normal pulses.     Heart sounds: Normal heart sounds.  Pulmonary:     Effort: Pulmonary effort is normal.     Breath sounds: Normal breath sounds.  Neurological:     Mental Status: She is alert and oriented to person, place, and time.      UC Treatments / Results  Labs (all labs ordered are listed, but only abnormal results are displayed) Labs Reviewed - No data to display  EKG   Radiology No results found.  Procedures Procedures (including critical care time)  Medications Ordered in UC Medications - No data to display  Initial Impression / Assessment and Plan / UC Course  I have reviewed the triage vital signs and the nursing notes.  Pertinent labs & imaging results that were available during my care of the patient were reviewed by me and considered in my medical decision making (see chart for details).  Acute URI  Vital signs stable, patient in no signs of distress nontoxic-appearing, lungs are clear to auscultation and O2 saturation 98% on room air, stable for outpatient management low suspicion for pneumonia or bronchitis therefore imaging deferred, symptoms worsening over the past 2 weeks most likely viral in etiology but as they have continued to persist will empirically place on antibiotics, prescribed Augmentin  and additionally prescribed Tessalon  and Promethazine  DM for management of harsh cough, recommend over-the-counter medications and nonpharmacological supportive care and advised follow-up with primary doctor if  symptoms persist Final Clinical Impressions(s) / UC Diagnoses   Final diagnoses:  Acute URI     Discharge Instructions      You have had new symptoms of the past 2 weeks indicating that there is most likely a virus that entered the body but as it has lingered and persisted without signs of resolution you will be placed on antibiotic  Take Augmentin  twice daily for 7 days  You may use Tessalon  pill every 8 hours as needed to help settle cough, may use cough syrup at bedtime if needed for rest  You can take Tylenol  and/or Ibuprofen  as needed for fever reduction and pain relief.   For cough: honey 1/2 to 1 teaspoon (you can dilute the honey in water  or another fluid).  You can also use guaifenesin and dextromethorphan for cough. You can use a humidifier for chest congestion and cough.  If you don't have a humidifier, you can sit in the bathroom with the hot shower running.      For sore throat: try warm salt water  gargles, cepacol lozenges, throat spray, warm tea or water  with lemon/honey, popsicles or ice, or OTC cold relief medicine for throat discomfort.   For congestion: take a daily anti-histamine like Zyrtec , Claritin, and a  oral decongestant, such as pseudoephedrine .  You can also use Flonase  1-2 sprays in each nostril daily.   It is important to stay hydrated: drink plenty of fluids (water , gatorade/powerade/pedialyte, juices, or teas) to keep your throat moisturized and help further relieve irritation/discomfort.  If your symptoms persist despite use of medication please follow-up with your primary doctor for reevaluation as you will most likely need a chest x-ray    ED Prescriptions     Medication Sig Dispense Auth. Provider   amoxicillin -clavulanate (AUGMENTIN ) 875-125 MG tablet Take 1 tablet by mouth every 12 (twelve) hours. 14 tablet Viliami Bracco R, NP   benzonatate  (TESSALON ) 100 MG capsule Take 1 capsule (100 mg total) by mouth every 8 (eight) hours. 21 capsule  Sharnice Bosler R, NP   promethazine -dextromethorphan (PROMETHAZINE -DM) 6.25-15 MG/5ML syrup Take 5 mLs by mouth at bedtime as needed. 118 mL Tijuan Dantes, Shelba SAUNDERS, NP      PDMP not reviewed this encounter.   Teresa Shelba SAUNDERS, NP 05/07/24 1032

## 2024-05-07 NOTE — ED Triage Notes (Signed)
 Patient states that he's had a cough for over a year, patient states that it got worse last night to the point she couldn't stop coughing. Patient states that she feels like something is in her throat. She's also having tonsil stones.

## 2024-05-20 NOTE — Telephone Encounter (Signed)
 Recommend office visit.  Diana Lagrange, MD\

## 2024-05-24 ENCOUNTER — Telehealth: Payer: Self-pay

## 2024-05-24 NOTE — Telephone Encounter (Signed)
 Left VM and sent MyChart message to let pt know Dr Abbey will be out of the office and their visit for 05/25/2024 needs to be rescheduled.   E2C2, please reschedule pt if they call back -kh

## 2024-05-25 ENCOUNTER — Ambulatory Visit

## 2024-05-25 DIAGNOSIS — G43109 Migraine with aura, not intractable, without status migrainosus: Secondary | ICD-10-CM | POA: Diagnosis not present

## 2024-05-25 DIAGNOSIS — G6289 Other specified polyneuropathies: Secondary | ICD-10-CM | POA: Diagnosis not present

## 2024-05-30 ENCOUNTER — Other Ambulatory Visit: Payer: Self-pay | Admitting: Medical Genetics

## 2024-05-30 DIAGNOSIS — Z006 Encounter for examination for normal comparison and control in clinical research program: Secondary | ICD-10-CM

## 2024-06-03 ENCOUNTER — Telehealth: Payer: Self-pay

## 2024-06-03 NOTE — Telephone Encounter (Signed)
 Disregard earlier note, pt back on schedule for 11/11.

## 2024-06-03 NOTE — Telephone Encounter (Signed)
 Left VM and sent Mychart message letting pt know that Physical needs to be rescheduled from 07/01/24.   E2C2, please reschedule pt's Physical if they call back -kh

## 2024-06-11 NOTE — Progress Notes (Unsigned)
 Office Visit Note  Patient: [name]             Date of Birth: [birth year]           MRN: [id number]             PCP: Abbey Bruckner, MD Referring: Hope Merle, MD Visit Date: 06/13/2024 Occupation: Data Unavailable  Subjective:  No chief complaint on file.   History of Present Illness: [name] is a 48 y.o. female ***     Activities of Daily Living:  Patient reports morning stiffness for *** {minute/hour:19697}.   Patient {ACTIONS;DENIES/REPORTS:21021675::Denies} nocturnal pain.  Difficulty dressing/grooming: {ACTIONS;DENIES/REPORTS:21021675::Denies} Difficulty climbing stairs: {ACTIONS;DENIES/REPORTS:21021675::Denies} Difficulty getting out of chair: {ACTIONS;DENIES/REPORTS:21021675::Denies} Difficulty using hands for taps, buttons, cutlery, and/or writing: {ACTIONS;DENIES/REPORTS:21021675::Denies}  No Rheumatology ROS completed.   PMFS History:  Patient Active Problem List   Diagnosis Date Noted   Elevated platelet count 04/13/2024   Polyarthralgia 01/10/2024   Marfanoid habitus 08/06/2023   Nocturnal cough 06/20/2023   Annual physical exam 06/20/2023   Leukocytosis 06/07/2023   Hyperlipidemia 05/22/2023   Vitamin B 12 deficiency 05/22/2023   Acanthosis nigricans 01/14/2023   TMJ pain dysfunction syndrome 10/23/2022   Plantar fasciitis, right 04/23/2022   Hypertrophic scar 01/14/2022   Keratosis pilaris 01/14/2022   Lipoma of right thigh 09/19/2021   Pain of left mastoid 08/07/2021   Sinus mucosal thickening 08/07/2021   Cervical arthritis 08/07/2021   Mass of right thigh 07/08/2021   Encounter for long-term (current) use of high-risk medication 09/12/2020   Itching of ear 07/30/2020   Chronic neck pain 03/22/2020   Prediabetes 12/23/2019   Melasma 12/21/2019   Irritable bowel syndrome with constipation 12/21/2019   Hypothyroidism 11/02/2019   Hypertrophy of nasal turbinates 11/02/2019   Nasal obstruction 11/02/2019    Inflamed epidermoid cyst of skin 10/27/2019   Trochanteric bursitis, left hip 03/17/2019   Trochanteric bursitis, right hip 03/17/2019   Abnormal auditory perception of both ears 02/24/2019   Concussion with no loss of consciousness 08/12/2018   Low back pain 08/12/2018   Allergic rhinitis 08/05/2018   Arthritis 07/01/2018   DDD (degenerative disc disease), lumbar 05/21/2018   Thoracic spondylosis 05/21/2018   DDD (degenerative disc disease), cervical 01/06/2018   Hidradenitis suppurativa 10/11/2017   Chronic frontal sinusitis 10/11/2017   Fatty liver 10/11/2017   Chronic ethmoidal sinusitis 09/13/2017   Photophobia of both eyes 09/13/2017   Estrogen deficiency 06/24/2017   Inflamed seborrheic keratosis of left cheek 10/15/2016   Dermatosis papulosa nigra 08/15/2015   Retained orthopedic hardware 12/13/2014   Chronic constipation 10/12/2012   Osteoarthritis of left hip 03/19/2012   Hx of total hip arthroplasty, bilateral 03/05/2012   Scoliosis 02/27/2012   Hot flashes 02/20/2012   Hiatal hernia 02/20/2012   Chronic pain of both hips 02/20/2012   Fatigue 12/19/2011   Acetabulum intrapelvic protrusion into pelvic region or thigh 05/15/2011   Impacted cerumen 03/06/2011   Hypertension 03/20/2010   Vitamin D  deficiency 08/20/2009   Anxiety 05/18/2009   Migraine headache 05/13/2006    Past Medical History:  Diagnosis Date   Abdominal pain, epigastric 12/17/2015   Anxiety    pt denies having dx of anxiety   BCC (basal cell carcinoma of skin)    right foot    Bunion, right foot    Chronic cough    Closed fibular fracture 10/2008   Left - Sustained 2/2 fall down stairs (same fall as tibular fracture). S/P internal fixation.   COVID-19  mild back pain, brain fog 01/2021   Duodenitis    EGD (07/14/2011) showing chronic duodenitis, H.pyloir neg   Elevated liver enzymes    Endometriosis    S/P TAH and salpingo-oophorectomy, right   Epigastric pain    Chronic, thought 2/2  gastritis, EGD (07/14/2011) showing chronic duodenitis, H.pyloir neg // Repeat EGD (09/2011) - esophagealring, sessible polyp in stomach body, hiatal hernia - rec ad probiotic, await bx   Esophageal ring    s/p dilatation during EGD (07/2011) - Dr. 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 With chronic OA of hip   History of bilateral hip replacements 06/11/2011   History of hysterectomy with bilateral oophorectomy 06/24/2017   History of migraine headaches    Typical symptoms - bright lights, unilateral (starting behind her left ear), throbbing .   Hot flashes    Hypertension    Internal hemorrhoids    Osteoarthritis of hip    Ovarian cancer (HCC) 1995   Protrusio acetabuli    diagnosed at age 33   Sessile colonic polyp    noted 09/2011 -- > rec colonoscopy in 10 years, 2023   Surgical menopause 08/2007   On HRT. Occuring since 01/09 following TAH and R-salpingo-oophorectomy    Tibial fracture 10/2008   Left - Sustained 2/2 fall down stairs. Patient is S/P closed internal fixation with Smith Nephew tibial nail locked proximal and distal   Vitamin D  deficiency 11/2009   Vit D level 15 in 11/2009    Family History  Problem Relation Age of Onset   Other Father        deceased while in service.    Breast cancer Sister 65       remission   Cancer Sister        breast   Lupus Cousin    Other Mother        unknown medical history   Dementia Maternal Grandmother        at old age   Parkinson's disease Maternal Grandfather    Multiple sclerosis Other        uncle    Colon cancer Neg Hx    Liver disease Neg Hx    Migraines Neg Hx    Past Surgical History:  Procedure Laterality Date   ABDOMINAL HYSTERECTOMY     CESAREAN SECTION   08/2005   Primary low transverse   COLONOSCOPY  09/22/2011   DOQ:Dzddpoz polyp in the rectum/Internal hemorrhoids, benign path   ESOPHAGOGASTRODUODENOSCOPY  09/22/2011   DOQ:Mpwh, esophageal/Sessile polyp in the body of the stomach/Hiatal hernia/ABDOMINAL PAIN LIKELY FUNCTIONAL V. POST-INFECTIOUS IBS, path: mild chronic gastritis   FRACTURE SURGERY     Right hip   JOINT REPLACEMENT     right hip   LAPAROSCOPY  10/2000   Operative laparoscopy with lysis of right adnexal adhesions and uterointestinal adhesions   left tib fib     OTHER SURGICAL HISTORY  02/2009   Closed treatment internal fixation, left tibia with Claudene and Nephew tibial nail locked proximal and distal   TOTAL ABDOMINAL HYSTERECTOMY W/ BILATERAL SALPINGOOPHORECTOMY  08/2007   for endometriosis. With concurrent right salpingo-oophorectomy  (2009), left oophorectomy (1995)   TUBAL LIGATION  08/2005   Right  tubal ligation   Social History   Tobacco Use   Smoking status: Never   Smokeless tobacco: Never  Vaping Use   Vaping status: Never Used  Substance Use Topics   Alcohol use: No    Alcohol/week: 0.0 standard drinks of alcohol   Drug use: No   Social History   Social History Narrative   Insurance: Medicare.   Patient is married with two children, Madison and Dozier (son 65 y.o as of 03/22/20 going to [employer]).    She lives in [place] but children go to school in [place]    Right handed   Caffeine : no     Immunization History  Administered Date(s) Administered   Dtap, Unspecified 02/07/1974, 01/26/1975, 04/13/1975, 06/11/1976   Influenza Inj Mdck Quad Pf 05/16/2020   Influenza,inj,Quad PF,6+ Mos 07/12/2019, 06/15/2021, 05/27/2022   Influenza-Unspecified 07/12/2019   Measles 07/25/1975   Mumps 07/25/1975   PFIZER Comirnaty(Gray Top)Covid-19 Tri-Sucrose Vaccine 09/24/2019, 10/19/2019, 06/16/2020   PFIZER(Purple Top)SARS-COV-2 Vaccination 09/24/2019, 10/19/2019, 06/16/2020   PPD Test 05/03/2013    Pfizer Covid-19 Vaccine Bivalent Booster 36yrs & up 06/15/2021   Pneumococcal Polysaccharide-23 02/19/2009   Polio, Unspecified 02/07/1974, 01/26/1975, 04/13/1975, 06/11/1976   Rubella 07/25/1975   Td 03/20/2010   Tdap 03/06/2011, 06/05/2016, 11/20/2023     Objective: Vital Signs: There were no vitals taken for this visit.   Physical Exam   Musculoskeletal Exam: ***  CDAI Exam: CDAI Score: -- Patient Global: --; Provider Global: -- Swollen: --; Tender: -- Joint Exam 06/13/2024   No joint exam has been documented for this visit   There is currently no information documented on the homunculus. Go to the Rheumatology activity and complete the homunculus joint exam.  Investigation: No additional findings.  Imaging: No results found.  Recent Labs: Lab Results  Component Value Date   WBC 10.6 (H) 04/13/2024   HGB 13.1 04/13/2024   PLT 396.0 04/13/2024   NA 139 04/13/2024   K 4.2 04/13/2024   CL 102 04/13/2024   CO2 26 04/13/2024   GLUCOSE 86 04/13/2024   BUN 13 04/13/2024   CREATININE 0.99 04/13/2024   BILITOT 0.7 04/13/2024   ALKPHOS 67 04/13/2024   AST 22 04/13/2024   ALT 25 04/13/2024   PROT 7.7 04/13/2024   ALBUMIN 4.5 04/13/2024   CALCIUM  9.8 04/13/2024   GFRAA >60 11/26/2019    Speciality Comments: No specialty comments available.  Procedures:  No procedures performed Allergies: Pineapple, Egg protein-containing drug products, Hctz [hydrochlorothiazide ], Latex, Lisinopril , Morphine and codeine , Shrimp (diagnostic), and Tape   Assessment / Plan:     Visit Diagnoses: No diagnosis found.  Orders: No orders of the defined types were placed in this encounter.  No orders of the defined types were placed in this encounter.   Face-to-face time spent with patient was *** minutes. Greater than 50% of time was spent in counseling and coordination of care.  Follow-Up Instructions: No follow-ups on file.   Lonni LELON Ester, MD  Note - This record has  been created using [employer].  Chart creation errors have been sought, but may not always  have been located. Such creation errors do not reflect on  the standard of medical care.

## 2024-06-13 ENCOUNTER — Ambulatory Visit: Attending: Internal Medicine | Admitting: Internal Medicine

## 2024-06-13 ENCOUNTER — Encounter: Payer: Self-pay | Admitting: Internal Medicine

## 2024-06-13 VITALS — BP 125/83 | HR 66 | Temp 97.8°F | Resp 13 | Ht 71.0 in | Wt 179.4 lb

## 2024-06-13 DIAGNOSIS — M79672 Pain in left foot: Secondary | ICD-10-CM | POA: Diagnosis not present

## 2024-06-13 DIAGNOSIS — M79671 Pain in right foot: Secondary | ICD-10-CM | POA: Insufficient documentation

## 2024-06-13 DIAGNOSIS — R7689 Other specified abnormal immunological findings in serum: Secondary | ICD-10-CM | POA: Insufficient documentation

## 2024-06-13 NOTE — Patient Instructions (Signed)
 I am checking specific antibody markers associated with the positive ANA to rule out more serious underlying conditions.  Consider checking out the [employer] for foot recommendations. They may have some good recommendations for less time and cost than a consultation with a specialty group such as the good feet store.    [facility] [facility] Address [address] [place], [place] [address]  Phone [phone]   I attached a few basic exercises to help with mobilizing the foot arch. Another good strengthening exercise is to try doing calf raises, lunge, or other stability exercise while elevating the middle side of the foot by about 15 degrees.   Foot Exercises  Toe Curls With Towel  1. Place a small towel on the floor. Using involved foot, curl towel toward you, using only your toes. Relax. 2. Repeat 10 times, 1-2 times per day.    Ice Massage Arch Roll  1. With involved foot resting on a frozen can or water  bottle, golf ball, or tennis ball, roll your foot back and forth over the object. 2. Repeat for 3-5 minutes, 2 times per day.    Toe Extension  1. Sit with involved leg crossed over uninvolved leg. Grasp toes with one hand and bend the toes and ankle upwards as far as possible to stretch the arch and calf muscle. With the other hand, perform deep massage along the arch of your foot. 2. Hold 10 seconds. Repeat for 2-3 minutes. Repeat 2-4 sessions per day.

## 2024-06-14 LAB — C3 AND C4
C3 Complement: 195 mg/dL — ABNORMAL HIGH (ref 83–193)
C4 Complement: 46 mg/dL (ref 15–57)

## 2024-06-14 LAB — SJOGRENS SYNDROME-A EXTRACTABLE NUCLEAR ANTIBODY: SSA (Ro) (ENA) Antibody, IgG: <1 AI

## 2024-06-14 LAB — ANTI-DNA ANTIBODY, DOUBLE-STRANDED: ds DNA Ab: 4 [IU]/mL

## 2024-06-14 LAB — ANTI-SMITH ANTIBODY: ENA SM Ab Ser-aCnc: <1 AI

## 2024-06-17 ENCOUNTER — Encounter: Payer: 59 | Admitting: Family Medicine

## 2024-06-21 ENCOUNTER — Encounter

## 2024-09-09 ENCOUNTER — Telehealth: Payer: Self-pay

## 2024-09-09 NOTE — Telephone Encounter (Signed)
 Copied from CRM #[id number]. Topic: Medical Record Request - Records Request >> Sep 09, 2024 12:06 PM China J wrote: Reason for CRM: The patient is calling to see if the clinic would be willing to print out a letter from 10/30/2020. The letter was written by an old provider Leone McLean-Scocuzza, MD) which was written to verify her disability so the post office can move her mailbox-- but when notifying CAL, Darice at the front desk let me know that this is something Dr. Abbey has approve and write since it's been so long.  Please call the patient at [phone]. She is aware of Dr. Abbey being back in office on Tuesday.

## 2024-09-13 ENCOUNTER — Other Ambulatory Visit: Payer: Self-pay

## 2024-09-13 ENCOUNTER — Ambulatory Visit
Admission: RE | Admit: 2024-09-13 | Discharge: 2024-09-13 | Disposition: A | Discharge status: Home / Self Care | Source: Home / Self Care

## 2024-09-13 VITALS — BP 123/83 | HR 90 | Temp 98.1°F | Resp 20

## 2024-09-13 DIAGNOSIS — M2550 Pain in unspecified joint: Secondary | ICD-10-CM | POA: Diagnosis not present

## 2024-09-13 DIAGNOSIS — J309 Allergic rhinitis, unspecified: Secondary | ICD-10-CM

## 2024-09-13 MED ORDER — AZELASTINE HCL 0.1 % NA SOLN: 1.0000 | Freq: Two times a day (BID) | NASAL | 2 refills | Status: AC

## 2024-09-13 MED ORDER — CETIRIZINE HCL 10 MG PO TABS: 10.0000 mg | ORAL_TABLET | Freq: Every day | ORAL | 2 refills | Status: AC

## 2024-09-13 MED ORDER — DEXAMETHASONE SOD PHOSPHATE PF 10 MG/ML IJ SOLN
10.0000 mg | Freq: Once | INTRAMUSCULAR | Status: AC
  Administered 2024-09-13: 10 mg via INTRAMUSCULAR

## 2024-09-14 ENCOUNTER — Ambulatory Visit (INDEPENDENT_AMBULATORY_CARE_PROVIDER_SITE_OTHER)

## 2024-09-14 ENCOUNTER — Telehealth: Payer: Self-pay | Admitting: Emergency Medicine

## 2024-09-14 ENCOUNTER — Inpatient Hospital Stay: Admission: RE | Admit: 2024-09-14 | Discharge: 2024-09-14

## 2024-09-14 VITALS — BP 147/88 | HR 67 | Temp 97.4°F | Resp 20

## 2024-09-14 DIAGNOSIS — J0140 Acute pansinusitis, unspecified: Secondary | ICD-10-CM

## 2024-09-14 DIAGNOSIS — M25562 Pain in left knee: Secondary | ICD-10-CM | POA: Diagnosis not present

## 2024-09-14 MED ORDER — FLUCONAZOLE 150 MG PO TABS: 150.0000 mg | ORAL_TABLET | ORAL | 0 refills | Status: AC | PRN

## 2024-09-14 MED ORDER — AMOXICILLIN-POT CLAVULANATE 875-125 MG PO TABS: 1.0000 | ORAL_TABLET | Freq: Two times a day (BID) | ORAL | 0 refills | Status: AC

## 2024-09-14 MED ORDER — CYCLOBENZAPRINE HCL 10 MG PO TABS: 10.0000 mg | ORAL_TABLET | Freq: Every day | ORAL | 0 refills | Status: AC

## 2024-09-14 MED ORDER — PREDNISONE 10 MG (21) PO TBPK: ORAL_TABLET | Freq: Every day | ORAL | 0 refills | Status: AC

## 2024-09-14 NOTE — Telephone Encounter (Signed)
 Spoke with patient to make her aware of Dr Graylon recommendations. Patient was inquiring about an appointment today for some joint pain she was currently experiencing. Patient states she hit her knee and has been experiencing some pain. Patient was advised to keep her scheduled appointment with the Urgent Care that she already had scheduled for today, as our office did not have any openings today. Patient states she would and I informed her that when she stops by to pick up her letter, she can have the Admin staff assist her with getting scheduled if she is still having concerns.

## 2024-09-14 NOTE — ED Triage Notes (Signed)
 Patient reports hit left knee and now complains of left knee pain and swelling x 2 days. Patient reports sinus pressure x 2 weeks ago. Patient has taken migraine medication with no relief. Rates knee pain 10/10 and sinus pressure 8/10.

## 2024-09-14 NOTE — ED Provider Notes (Signed)
 " [facility]    CSN: [id number] Arrival date & time: 09/14/24  1030      History   Chief Complaint Chief Complaint  Patient presents with   Joint Pain    Sinus pain, throat pain, face pain, joint pain - Entered by patient   Knee Injury   Facial Pain    HPI Arisbeth R Allen-Bruce is a 49 y.o. female.   Patient concerned with postnasal drip, sinus drainage, sinus pain and pressure above the eyebrows and around the nose, a persisting headache and a primarily nonproductive cough present for 5 to 6 weeks.  Sore throat beginning 3 weeks ago after exposure to strep.  Endorses painful to lie flat.  Known sick contacts prior.  Has attempted use of Benadryl  nasal spray and Nurtec.  Endorses a history of reoccurring sinusitis.  Around mid January had taken doxycycline  due to an abscess under the axilla, endorses improved sinus symptoms but did not fully resolve.  Was evaluated on 09/13/2024 and a different [place] urgent care, at that time timeline of symptoms different and therefore was prescribed antihistamines and nasal spray which she endorses no improvement with.  Patient concerned with left-sided knee pain to the anterior beginning 2 days ago after knee collided with a edged of a table.  Difficult and painful to bear weight.  Denies numbness or tingling.  Has attempted use of ibuprofen  with minimal relief.  Patient requiring wheelchair to be taken to the back of clinic, walked into clinic without assistance.  Was evaluated in a different [place] urgent care on 09/13/2024.               Past Medical History:  Diagnosis Date   Abdominal pain, epigastric 12/17/2015   Anxiety    pt denies having dx of anxiety   BCC (basal cell carcinoma of skin)    right foot    Bunion, right foot    Chronic cough    Closed fibular fracture 10/2008   Left - Sustained 2/2 fall down stairs (same fall as tibular fracture). S/P internal fixation.   COVID-19    mild back pain, brain fog  01/2021   Duodenitis    EGD (07/14/2011) showing chronic duodenitis, H.pyloir neg   Elevated liver enzymes    Endometriosis    S/P TAH and salpingo-oophorectomy, right   Epigastric pain    Chronic, thought 2/2 gastritis, EGD (07/14/2011) showing chronic duodenitis, H.pyloir neg // Repeat EGD (09/2011) - esophagealring, sessible polyp in stomach body, hiatal hernia - rec ad probiotic, await bx   Esophageal ring    s/p dilatation during EGD (07/2011) - Dr. 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 With chronic OA of hip   History of bilateral hip replacements 06/11/2011   History of hysterectomy with bilateral oophorectomy 06/24/2017   History of migraine headaches    Typical symptoms - bright lights, unilateral (starting behind her left ear), throbbing .   Hot flashes    Hypertension    Internal hemorrhoids    Osteoarthritis of hip    Ovarian cancer (HCC) 1995   Protrusio acetabuli    diagnosed at age 20   Sessile colonic polyp    noted 09/2011 -- >  rec colonoscopy in 10 years, 2023   Surgical menopause 08/2007   On HRT. Occuring since 01/09 following TAH and R-salpingo-oophorectomy    Tibial fracture 10/2008   Left - Sustained 2/2 fall down stairs. 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   Nocturnal cough 06/20/2023   Annual physical exam 06/20/2023   Leukocytosis 06/07/2023    Hyperlipidemia 05/22/2023   Vitamin B 12 deficiency 05/22/2023   Acanthosis nigricans 01/14/2023   TMJ pain dysfunction syndrome 10/23/2022   Plantar fasciitis, right 04/23/2022   Hypertrophic scar 01/14/2022   Keratosis pilaris 01/14/2022   Lipoma of right thigh 09/19/2021   Pain of left mastoid 08/07/2021   Sinus mucosal thickening 08/07/2021   Cervical arthritis 08/07/2021   Mass of right thigh 07/08/2021   Encounter for long-term (current) use of high-risk medication 09/12/2020   Itching of ear 07/30/2020   Chronic neck pain 03/22/2020   Prediabetes 12/23/2019   Melasma 12/21/2019   Irritable bowel syndrome with constipation 12/21/2019   Hypothyroidism 11/02/2019   Hypertrophy of nasal turbinates 11/02/2019   Nasal obstruction 11/02/2019   Inflamed epidermoid cyst of skin 10/27/2019   Trochanteric bursitis, left hip 03/17/2019   Trochanteric bursitis, right hip 03/17/2019   Abnormal auditory perception of both ears 02/24/2019   Concussion with no loss of consciousness 08/12/2018   Low back pain 08/12/2018   Allergic rhinitis 08/05/2018   Arthritis 07/01/2018   DDD (degenerative disc disease), lumbar 05/21/2018   Thoracic spondylosis 05/21/2018   DDD (degenerative disc disease), cervical 01/06/2018   Hidradenitis suppurativa 10/11/2017   Chronic frontal sinusitis 10/11/2017   Fatty liver 10/11/2017   Chronic ethmoidal sinusitis 09/13/2017   Photophobia of both eyes 09/13/2017   Estrogen deficiency 06/24/2017   Inflamed seborrheic keratosis of left cheek 10/15/2016   Dermatosis papulosa nigra 08/15/2015   Retained orthopedic hardware 12/13/2014   Chronic constipation 10/12/2012   Osteoarthritis of left hip 03/19/2012   Hx of total hip arthroplasty, bilateral 03/05/2012   Scoliosis 02/27/2012   Hot flashes 02/20/2012   Hiatal hernia 02/20/2012   Chronic pain of both hips 02/20/2012   Fatigue 12/19/2011   Acetabulum intrapelvic protrusion into pelvic region or thigh  05/15/2011   Impacted cerumen 03/06/2011   Hypertension 03/20/2010   Vitamin D  deficiency 08/20/2009   Anxiety 05/18/2009   Migraine headache 05/13/2006    Past Surgical History:  Procedure Laterality Date   ABDOMINAL HYSTERECTOMY     CESAREAN SECTION  08/2005   Primary low transverse   COLONOSCOPY  09/22/2011   DOQ:Dzddpoz polyp in the rectum/Internal hemorrhoids, benign path   ESOPHAGOGASTRODUODENOSCOPY  09/22/2011   DOQ:Mpwh, esophageal/Sessile polyp in the body of the stomach/Hiatal hernia/ABDOMINAL PAIN LIKELY FUNCTIONAL V. POST-INFECTIOUS IBS, path: mild chronic gastritis   FRACTURE SURGERY     Right hip   JOINT REPLACEMENT     right hip   LAPAROSCOPY  10/2000   Operative laparoscopy with lysis of right adnexal adhesions and uterointestinal adhesions   left tib fib     OTHER SURGICAL HISTORY  02/2009   Closed treatment internal fixation, left tibia with Smith and Nephew tibial nail locked proximal and  distal   TOTAL ABDOMINAL HYSTERECTOMY W/ BILATERAL SALPINGOOPHORECTOMY  08/2007   for endometriosis. With concurrent right salpingo-oophorectomy  (2009), left oophorectomy (1995)   TUBAL LIGATION  08/2005   Right tubal ligation    OB History   No obstetric history on file.      Home Medications    Prior to Admission medications  Medication Sig Start Date End Date Taking? Authorizing Provider  amLODipine  (NORVASC ) 10 MG tablet Take 1 tablet (10 mg total) by mouth daily. 04/13/24   Bair, Kalpana, MD  amoxicillin -clavulanate (AUGMENTIN ) 875-125 MG tablet Take 1 tablet by mouth every 12 (twelve) hours. Patient not taking: No sig reported 05/07/24   Teresa Shelba SAUNDERS, NP  azelastine  (ASTELIN ) 0.1 % nasal spray Place 1 spray into both nostrils 2 (two) times daily. Use in each nostril as directed 09/13/24   Stuart Vernell Norris, PA-C  benzonatate  (TESSALON ) 100 MG capsule Take 1 capsule (100 mg total) by mouth every 8 (eight) hours. Patient not taking: No sig reported 05/07/24    Teresa Shelba SAUNDERS, NP  cetirizine  (ZYRTEC  ALLERGY ) 10 MG tablet Take 1 tablet (10 mg total) by mouth daily. 09/13/24   Stuart Vernell Norris, PA-C  cetirizine  (ZYRTEC ) 10 MG tablet Take 1 tablet (10 mg total) by mouth at bedtime. TAKE 1 TABLET (10 MG TOTAL) BY MOUTH AT BEDTIME AS NEEDED FOR ALLERGIES. Patient taking differently: Take 10 mg by mouth as needed. TAKE 1 TABLET (10 MG TOTAL) BY MOUTH AT BEDTIME AS NEEDED FOR ALLERGIES. 04/13/24   Bair, Kalpana, MD  clindamycin  (CLEOCIN  T) 1 % external solution Apply to affected areas daily to twice daily Patient not taking: No sig reported 01/18/24   [provider]  clobetasol ointment (TEMOVATE) 0.05 % Apply topically. Patient not taking: No sig reported 03/10/24   [provider]  pantoprazole  (PROTONIX ) 20 MG tablet Take 20 mg by mouth daily. 12/10/23   [provider]  promethazine -dextromethorphan (PROMETHAZINE -DM) 6.25-15 MG/5ML syrup Take 5 mLs by mouth at bedtime as needed. Patient not taking: No sig reported 05/07/24   Teresa Shelba SAUNDERS, NP  Prucalopride Succinate 2 MG TABS Take 1 tablet by mouth daily. Patient not taking: No sig reported 02/17/24   [provider]  Rimegepant Sulfate (NURTEC) 75 MG TBDP Take 1 tablet (75 mg total) by mouth daily as needed. 11/17/23   Hope Merle, MD  spironolactone  (ALDACTONE ) 50 MG tablet Take 1 tablet (50 mg total) by mouth daily. In am 06/16/23   Walsh, Tanya, MD  Tenapanor HCl 50 MG TABS Take 50 mg by mouth. Patient not taking: No sig reported 04/07/24   [provider]    Family History Family History  Problem Relation Age of Onset   Other Mother        unknown medical history   Other Father        deceased while in service.    Breast cancer Sister 68       remission   Cancer Sister        breast   Hypertension Maternal Grandmother    Dementia Maternal Grandmother        at old age   Lupus Cousin    Parkinson's disease Cousin    Multiple sclerosis Other         uncle    Colon cancer Neg Hx    Liver disease Neg Hx    Migraines Neg Hx     Social History Social History[1]   Allergies  Pineapple, Egg protein-containing drug products, Hctz [hydrochlorothiazide ], Latex, Lisinopril , Morphine and codeine , Shrimp (diagnostic), and Tape   Review of Systems Review of Systems  HENT:  Positive for postnasal drip, sinus pressure, sinus pain and sore throat. Negative for congestion, dental problem, drooling, ear discharge, ear pain, facial swelling, hearing loss, mouth sores, nosebleeds, rhinorrhea, sneezing, tinnitus, trouble swallowing and voice change.   Respiratory:  Positive for cough. Negative for apnea, choking, chest tightness, shortness of breath, wheezing and stridor.      Physical Exam Triage Vital Signs ED Triage Vitals  Encounter Vitals Group     BP 09/14/24 1038 (!) 147/88     Girls Systolic BP Percentile --      Girls Diastolic BP Percentile --      Boys Systolic BP Percentile --      Boys Diastolic BP Percentile --      Pulse Rate 09/14/24 1038 67     Resp 09/14/24 1038 20     Temp 09/14/24 1038 (!) 97.4 F (36.3 C)     Temp Source 09/14/24 1038 Oral     SpO2 09/14/24 1038 98 %     Weight --      Height --      Head Circumference --      Peak Flow --      Pain Score 09/14/24 1042 8     Pain Loc --      Pain Education --      Exclude from Growth Chart --    No data found.  Updated Vital Signs BP (!) 147/88 (BP Location: Right Arm)   Pulse 67   Temp (!) 97.4 F (36.3 C) (Oral)   Resp 20   SpO2 98%   Visual Acuity Right Eye Distance:   Left Eye Distance:   Bilateral Distance:    Right Eye Near:   Left Eye Near:    Bilateral Near:     Physical Exam Constitutional:      Appearance: Normal appearance.  HENT:     Right Ear: Tympanic membrane, ear canal and external ear normal.     Left Ear: Tympanic membrane, ear canal and external ear normal.     Nose: Congestion present.     Right Sinus: Maxillary  sinus tenderness present.     Left Sinus: Maxillary sinus tenderness present.     Mouth/Throat:     Pharynx: No oropharyngeal exudate or posterior oropharyngeal erythema.  Eyes:     Extraocular Movements: Extraocular movements intact.  Cardiovascular:     Rate and Rhythm: Normal rate and regular rhythm.     Pulses: Normal pulses.     Heart sounds: Normal heart sounds.  Pulmonary:     Effort: Pulmonary effort is normal.     Breath sounds: Normal breath sounds.  Musculoskeletal:     Comments: Generalized  tenderness to the anterior and posterior knee pain without point tenderness, no ecchymosis swelling or deformity, able to bear weight and complete full range of motion pain elicited with all movement  Neurological:     Mental Status: She is alert and oriented to person, place, and time. Mental status is at baseline.      UC Treatments / Results  Labs (all labs ordered are listed, but only abnormal results are displayed) Labs Reviewed - No data to display  EKG   Radiology No results found.  Procedures Procedures (including critical care time)  Medications Ordered in UC Medications - No data to display  Initial  Impression / Assessment and Plan / UC Course  I have reviewed the triage vital signs and the nursing notes.  Pertinent labs & imaging results that were available during my care of the patient were reviewed by me and considered in my medical decision making (see chart for details). \ Acute nonrecurrent pansinusitis, acute left knee pain  Patient is in no signs of distress nor toxic appearing.  Vital signs are stable.  Low suspicion for pneumonia, pneumothorax or bronchitis and therefore will defer imaging.  Based on timeline of that it is etiology most likely bacterial, prescribing Augmentin  for management.May use additional over-the-counter medications as needed for supportive care.  May follow-up with urgent care as needed if symptoms persist or worsen.  Patient  endorsing injury to the left knee, x-ray completed, pending will notify of results via telephone, knee compression given for stability and support, recommend orthopedic follow-up if injury noted, recommended supportive care through [facility], prescribed prednisone  for management of the knee, given IM steroid injection 1 day prior   Final Clinical Impressions(s) / UC Diagnoses   Final diagnoses:  None   Discharge Instructions   None    ED Prescriptions   None    PDMP not reviewed this encounter.     [1]  Social History Tobacco Use   Smoking status: Never   Smokeless tobacco: Never  Vaping Use   Vaping status: Never Used  Substance Use Topics   Alcohol use: No    Alcohol/week: 0.0 standard drinks of alcohol   Drug use: No     Teresa Shelba SAUNDERS, NP 09/14/24 1127  "

## 2024-09-14 NOTE — Discharge Instructions (Addendum)
 Today you will be treated for a sinus infection, take Augmentin  twice daily for 7 days  Due to injury x-ray has been completed for your left knee, pending and you will be notified of results by telephone  You have been given compression for stability and support, use whenever completing activity  If the injury is noted to the bone of the joint you will need to follow-up with orthopedics for further evaluation, information is on from pain  Begin prednisone  every morning with food as directed this reduces inflammation throughout the body and will help with sinus pressure as well as knee pain  You may use muscle relaxant at bedtime as needed for additional comfort  May use ice or heat over the affected area in 10 to 15-minute intervals  May cushion the body with pillows for support   For cough: honey 1/2 to 1 teaspoon (you can dilute the honey in water  or another fluid).  You can also use guaifenesin and dextromethorphan for cough. You can use a humidifier for chest congestion and cough.  If you don't have a humidifier, you can sit in the bathroom with the hot shower running.      For sore throat: try warm salt water  gargles, cepacol lozenges, throat spray, warm tea or water  with lemon/honey, popsicles or ice, or OTC cold relief medicine for throat discomfort.   For congestion: take a daily anti-histamine like Zyrtec , Claritin, and a oral decongestant, such as pseudoephedrine .  You can also use Flonase  1-2 sprays in each nostril daily.   It is important to stay hydrated: drink plenty of fluids (water , gatorade/powerade/pedialyte, juices, or teas) to keep your throat moisturized and help further relieve irritation/discomfort.

## 2024-09-14 NOTE — Telephone Encounter (Signed)
 Please let the patient know I have signed a new letter for her to help assist with moving her mailbox closer to patient's house.  Letter is placed and completed folder.  She can come pick this up whenever convenient for her.  Luke Shade, MD

## 2024-09-14 NOTE — Telephone Encounter (Signed)
 Notify patient of x-ray results via telephone, 2 patient identifiers used, x-ray concerning for tibial fracture, on exam there is generalized tenderness without point tenderness at the area of concern did discuss low suspicion for fracture with patient and then she endorsed that she fell 2 weeks ago onto the left side of her body while taking down Christmas tree prior to the new injury that was discussed in the clinic, at this time advised patient to make follow-up appointment with orthopedics for reevaluation

## 2024-09-21 ENCOUNTER — Ambulatory Visit: Admitting: Orthopedic Surgery

## 2024-09-26 ENCOUNTER — Ambulatory Visit: Admitting: Orthopedic Surgery
